# Patient Record
Sex: Male | Born: 1950 | Race: White | Hispanic: No | Marital: Single | Smoking: Current every day smoker
Health system: Southern US, Community
[De-identification: ages and names within clinical notes are randomized; demographics above are authoritative.]

## PROBLEM LIST (undated history)

## (undated) DIAGNOSIS — N400 Enlarged prostate without lower urinary tract symptoms: Secondary | ICD-10-CM

## (undated) DIAGNOSIS — I509 Heart failure, unspecified: Secondary | ICD-10-CM

## (undated) DIAGNOSIS — J449 Chronic obstructive pulmonary disease, unspecified: Secondary | ICD-10-CM

## (undated) DIAGNOSIS — H9190 Unspecified hearing loss, unspecified ear: Secondary | ICD-10-CM

## (undated) DIAGNOSIS — D649 Anemia, unspecified: Secondary | ICD-10-CM

## (undated) DIAGNOSIS — G43909 Migraine, unspecified, not intractable, without status migrainosus: Secondary | ICD-10-CM

## (undated) DIAGNOSIS — E785 Hyperlipidemia, unspecified: Secondary | ICD-10-CM

## (undated) DIAGNOSIS — K56609 Unspecified intestinal obstruction, unspecified as to partial versus complete obstruction: Secondary | ICD-10-CM

## (undated) DIAGNOSIS — D3502 Benign neoplasm of left adrenal gland: Secondary | ICD-10-CM

## (undated) DIAGNOSIS — D696 Thrombocytopenia, unspecified: Secondary | ICD-10-CM

## (undated) DIAGNOSIS — E43 Unspecified severe protein-calorie malnutrition: Secondary | ICD-10-CM

## (undated) HISTORY — PX: OTHER SURGICAL HISTORY: SHX169

## (undated) HISTORY — PX: APPENDECTOMY: SHX54

---

## 1999-02-22 ENCOUNTER — Emergency Department (HOSPITAL_COMMUNITY): Admission: EM | Admit: 1999-02-22 | Discharge: 1999-02-22 | Payer: Self-pay

## 2011-06-15 ENCOUNTER — Emergency Department (HOSPITAL_COMMUNITY): Payer: Self-pay

## 2011-06-15 ENCOUNTER — Emergency Department (HOSPITAL_COMMUNITY)
Admission: EM | Admit: 2011-06-15 | Discharge: 2011-06-15 | Disposition: A | Discharge status: Home / Self Care | Payer: Self-pay | Attending: Emergency Medicine | Admitting: Emergency Medicine

## 2011-06-15 DIAGNOSIS — S42009A Fracture of unspecified part of unspecified clavicle, initial encounter for closed fracture: Secondary | ICD-10-CM | POA: Insufficient documentation

## 2011-06-15 DIAGNOSIS — R079 Chest pain, unspecified: Secondary | ICD-10-CM | POA: Insufficient documentation

## 2012-06-15 ENCOUNTER — Other Ambulatory Visit (HOSPITAL_COMMUNITY): Payer: Self-pay | Admitting: Family Medicine

## 2012-06-15 DIAGNOSIS — J449 Chronic obstructive pulmonary disease, unspecified: Secondary | ICD-10-CM

## 2012-06-20 ENCOUNTER — Ambulatory Visit (HOSPITAL_COMMUNITY)
Admission: RE | Admit: 2012-06-20 | Discharge: 2012-06-20 | Disposition: A | Discharge status: Home / Self Care | Payer: Medicaid Other | Source: Ambulatory Visit | Attending: Family Medicine | Admitting: Family Medicine

## 2012-06-20 DIAGNOSIS — J4489 Other specified chronic obstructive pulmonary disease: Secondary | ICD-10-CM | POA: Insufficient documentation

## 2012-06-20 DIAGNOSIS — J449 Chronic obstructive pulmonary disease, unspecified: Secondary | ICD-10-CM | POA: Insufficient documentation

## 2012-06-20 MED ORDER — ALBUTEROL SULFATE (5 MG/ML) 0.5% IN NEBU: 2.5000 mg | INHALATION_SOLUTION | Freq: Once | RESPIRATORY_TRACT | Status: DC

## 2013-10-25 ENCOUNTER — Encounter (HOSPITAL_COMMUNITY): Payer: Self-pay | Admitting: Emergency Medicine

## 2013-10-25 ENCOUNTER — Emergency Department (HOSPITAL_COMMUNITY)
Admission: EM | Admit: 2013-10-25 | Discharge: 2013-10-25 | Disposition: A | Discharge status: Home / Self Care | Payer: Medicaid Other | Attending: Emergency Medicine | Admitting: Emergency Medicine

## 2013-10-25 ENCOUNTER — Emergency Department (HOSPITAL_COMMUNITY): Payer: Medicaid Other

## 2013-10-25 DIAGNOSIS — E871 Hypo-osmolality and hyponatremia: Secondary | ICD-10-CM | POA: Insufficient documentation

## 2013-10-25 DIAGNOSIS — F17200 Nicotine dependence, unspecified, uncomplicated: Secondary | ICD-10-CM | POA: Insufficient documentation

## 2013-10-25 DIAGNOSIS — J441 Chronic obstructive pulmonary disease with (acute) exacerbation: Secondary | ICD-10-CM | POA: Insufficient documentation

## 2013-10-25 HISTORY — DX: Chronic obstructive pulmonary disease, unspecified: J44.9

## 2013-10-25 LAB — CBC
HCT: 38.2 % — ABNORMAL LOW (ref 39.0–52.0)
HEMOGLOBIN: 13.5 g/dL (ref 13.0–17.0)
MCH: 29.5 pg (ref 26.0–34.0)
MCHC: 35.3 g/dL (ref 30.0–36.0)
MCV: 83.4 fL (ref 78.0–100.0)
Platelets: 287 10*3/uL (ref 150–400)
RBC: 4.58 MIL/uL (ref 4.22–5.81)
RDW: 14.5 % (ref 11.5–15.5)
WBC: 8.1 10*3/uL (ref 4.0–10.5)

## 2013-10-25 LAB — POCT I-STAT TROPONIN I: TROPONIN I, POC: 0.02 ng/mL (ref 0.00–0.08)

## 2013-10-25 LAB — BASIC METABOLIC PANEL
BUN: 18 mg/dL (ref 6–23)
CO2: 23 mEq/L (ref 19–32)
Calcium: 9.3 mg/dL (ref 8.4–10.5)
Chloride: 88 mEq/L — ABNORMAL LOW (ref 96–112)
Creatinine, Ser: 0.76 mg/dL (ref 0.50–1.35)
Glucose, Bld: 109 mg/dL — ABNORMAL HIGH (ref 70–99)
POTASSIUM: 4.7 meq/L (ref 3.7–5.3)
Sodium: 128 mEq/L — ABNORMAL LOW (ref 137–147)

## 2013-10-25 LAB — PRO B NATRIURETIC PEPTIDE: PRO B NATRI PEPTIDE: 193.6 pg/mL — AB (ref 0–125)

## 2013-10-25 MED ORDER — IPRATROPIUM-ALBUTEROL 20-100 MCG/ACT IN AERS: 1.0000 | INHALATION_SPRAY | Freq: Four times a day (QID) | RESPIRATORY_TRACT | Status: DC

## 2013-10-25 MED ORDER — PREDNISONE 50 MG PO TABS: 50.0000 mg | ORAL_TABLET | Freq: Every day | ORAL | Status: DC

## 2013-10-25 MED ORDER — ALBUTEROL SULFATE HFA 108 (90 BASE) MCG/ACT IN AERS: 1.0000 | INHALATION_SPRAY | RESPIRATORY_TRACT | Status: DC | PRN

## 2013-10-25 MED ORDER — METHYLPREDNISOLONE SODIUM SUCC 125 MG IJ SOLR
125.0000 mg | Freq: Once | INTRAMUSCULAR | Status: AC
Start: 2013-10-25 — End: 2013-10-25
  Administered 2013-10-25: 125 mg via INTRAVENOUS
  Filled 2013-10-25: qty 2

## 2013-10-25 MED ORDER — IPRATROPIUM-ALBUTEROL 0.5-2.5 (3) MG/3ML IN SOLN
3.0000 mL | Freq: Once | RESPIRATORY_TRACT | Status: AC
  Administered 2013-10-25: 3 mL via RESPIRATORY_TRACT
  Filled 2013-10-25: qty 3

## 2013-10-25 NOTE — ED Provider Notes (Signed)
CSN: [id number]     Arrival date & time 10/25/13  1231 History   First MD Initiated Contact with Patient 10/25/13 1247     Chief Complaint  Patient presents with  . Shortness of Breath  . Wheezing  . Cough    Patient is a 63 y.o. male presenting with shortness of breath, wheezing, cough, and fever. The history is provided by the patient.  Shortness of Breath Severity:  Moderate Onset quality:  Gradual Duration:  2 weeks Timing:  Constant Progression:  Worsening Relieved by:  Nothing Worsened by:  Exertion Associated symptoms: cough and wheezing   Associated symptoms: no chest pain and no fever   Wheezing Associated symptoms: cough and shortness of breath   Associated symptoms: no chest pain and no fever   Cough Associated symptoms: shortness of breath and wheezing   Associated symptoms: no chest pain and no fever   Fever Associated symptoms: cough   Associated symptoms: no chest pain    Pt has history of COPD.  He ran out of his medications 2 weeks ago.  He states he only can get one refill once a month and has run out.  Over the last two weeks after running out his medications his shortness of breath has increased.   Past Medical History  Diagnosis Date  . COPD (chronic obstructive pulmonary disease)    Past Surgical History  Procedure Laterality Date  . Appendectomy     No family history on file. History  Substance Use Topics  . Smoking status: Current Every Day Smoker  . Smokeless tobacco: Not on file  . Alcohol Use: Yes    Review of Systems  Constitutional: Negative for fever.  Respiratory: Positive for cough, shortness of breath and wheezing.   Cardiovascular: Negative for chest pain.  All other systems reviewed and are negative.    Allergies  Review of patient's allergies indicates no known allergies.  Home Medications   Current Outpatient Rx  Name  Route  Sig  Dispense  Refill  . Ipratropium-Albuterol (COMBIVENT RESPIMAT) 20-100 MCG/ACT AERS  respimat   Inhalation   Inhale 1 puff into the lungs every 6 (six) hours.         Marland Kitchen oxycodone (OXY-IR) 5 MG capsule   Oral   Take 5 mg by mouth every 4 (four) hours as needed.         . traZODone (DESYREL) 50 MG tablet   Oral   Take 50 mg by mouth at bedtime.         . predniSONE (DELTASONE) 50 MG tablet   Oral   Take 1 tablet (50 mg total) by mouth daily.   5 tablet   0     Dispense as written.    BP 121/83  Pulse 92  Temp(Src) 98.6 F (37 C) (Oral)  Resp 15  SpO2 95% Physical Exam  Nursing note and vitals reviewed. Constitutional: No distress.  Thin   HENT:  Head: Normocephalic and atraumatic.  Right Ear: External ear normal.  Left Ear: External ear normal.  Eyes: Conjunctivae are normal. Right eye exhibits no discharge. Left eye exhibits no discharge. No scleral icterus.  Neck: Neck supple. No tracheal deviation present.  Cardiovascular: Normal rate, regular rhythm and intact distal pulses.   Pulmonary/Chest: Effort normal and breath sounds normal. No stridor. No respiratory distress. He has no wheezes. He has no rales.  Abdominal: Soft. Bowel sounds are normal. He exhibits no distension. There is no tenderness. There is no  rebound and no guarding.  Musculoskeletal: He exhibits no edema and no tenderness.  Neurological: He is alert. He has normal strength. No cranial nerve deficit (no facial droop, extraocular movements intact, no slurred speech) or sensory deficit. He exhibits normal muscle tone. He displays no seizure activity. Coordination normal.  Skin: Skin is warm and dry. No rash noted. He is not diaphoretic.  Psychiatric: He has a normal mood and affect.    ED Course  Procedures (including critical care time) Labs Review Labs Reviewed  BASIC METABOLIC PANEL - Abnormal; Notable for the following:    Sodium 128 (*)    Chloride 88 (*)    Glucose, Bld 109 (*)    All other components within normal limits  CBC - Abnormal; Notable for the following:     HCT 38.2 (*)    All other components within normal limits  PRO B NATRIURETIC PEPTIDE - Abnormal; Notable for the following:    Pro B Natriuretic peptide (BNP) 193.6 (*)    All other components within normal limits  POCT I-STAT TROPONIN I   Imaging Review Dg Chest 2 View  10/25/2013   CLINICAL DATA:  Productive cough, shortness of breath, wheezing, history COPD, smoking  EXAM: CHEST  2 VIEW  COMPARISON:  06/15/2011  FINDINGS: Normal heart size, mediastinal contours, and pulmonary vascularity.  Emphysematous and minimal bronchitic changes consistent with COPD.  Probable bilateral nipple shadows, unchanged.  No acute infiltrate, pleural effusion or pneumothorax.  No acute osseous findings.  IMPRESSION: COPD changes.  No acute abnormalities.   Electronically Signed   By: Lavonia Dana M.D.   On: 10/25/2013 13:34    EKG Interpretation    Date/Time:  Wednesday October 25 2013 12:57:05 EST Ventricular Rate:  80 PR Interval:  153 QRS Duration: 90 QT Interval:  368 QTC Calculation: 424 R Axis:   73 Text Interpretation:  Sinus rhythm Baseline wander in lead(s) I III aVL poor r wave progression, no prior tracing for comparison Baseline wander in lead(s) I III aVL Confirmed by Pharell Rolfson  MD-J, Benard Minturn ([id number]) on 10/25/2013 1:41:51 PM           Medications  albuterol (PROVENTIL HFA;VENTOLIN HFA) 108 (90 BASE) MCG/ACT inhaler 1-2 puff (not administered)  ipratropium-albuterol (DUONEB) 0.5-2.5 (3) MG/3ML nebulizer solution 3 mL (3 mLs Nebulization Given 10/25/13 1413)  methylPREDNISolone sodium succinate (SOLU-MEDROL) 125 mg/2 mL injection 125 mg (125 mg Intravenous Given 10/25/13 1344)    MDM   1. COPD exacerbation     Pt improved with ed treatments.  I went to check on him in the room and he was dressed and ready to leave.  I will give him an albuterol inhaler in the Ed.  rx prednisone.  Hyponatremia noted on labs.  Recommend follow up with PCP.  He is not confused or lethargic, suspect this is  chronic but he will need to follow up with PCP.    At this time there does not appear to be any evidence of an acute emergency medical condition and the patient appears stable for discharge with appropriate outpatient follow up.     Kathalene Frames, MD 10/25/13 [phone]

## 2013-10-25 NOTE — ED Notes (Signed)
Patient transported to X-ray 

## 2013-10-25 NOTE — ED Notes (Signed)
Discharge instructions explained to pt; pt given copy of instructions and prescriptions for prednisone and combivent; pt spoke w/ case management about how to afford his medications; pt verbalized understanding instructions.

## 2013-10-25 NOTE — Progress Notes (Signed)
   CARE MANAGEMENT ED NOTE 10/25/2013  Patient:  Anthony Bolton, Anthony Bolton   Account Number:  [id number]  Date Initiated:  10/25/2013  Documentation initiated by:  Livia Snellen  Subjective/Objective Assessment:   Patient presents to ED with shortness nof breath wheezing and cough     Subjective/Objective Assessment Detail:     Action/Plan:   Action/Plan Detail:   Anticipated DC Date:  10/25/2013     Status Recommendation to Physician:   Result of Recommendation:      [place]  Other  Medication Assistance    Choice offered to / List presented to:            Status of service:  Completed, signed off  ED Comments:   ED Comments Detail:  [facility] consulted to speak to patient regarding medication assistance.  Patient currently with active Medicaid insurance.  EDCM spoke to patient at bedside.  EDCM explained to patient that he is active with Medicaid insurance and his medication copay is three dollars, therefore EDCM cannot assist with medication.  Patient verbalized understanding.  EDCM provided patient with a lsit of financial resources in the community sucha as local churches and [employer] to help with payment of medications if needed.  Patient reports that he is not homeless.  Patient thankful for resources.  No further EDCM needs at this time.

## 2013-10-25 NOTE — Discharge Instructions (Signed)
Chronic Obstructive Pulmonary Disease  Chronic obstructive pulmonary disease (COPD) is a common lung condition in which airflow from the lungs is limited. COPD is a general term that can be used to describe many different lung problems that limit airflow, including both chronic bronchitis and emphysema.  If you have COPD, your lung function will probably never return to normal, but there are measures you can take to improve lung function and make yourself feel better.   CAUSES   · Smoking (common).    · Exposure to secondhand smoke.    · Genetic problems.  · Chronic inflammatory lung diseases or recurrent infections.  SYMPTOMS   · Shortness of breath, especially with physical activity.    · Deep, persistent (chronic) cough with a large amount of thick mucus.    · Wheezing.    · Rapid breaths (tachypnea).    · Gray or bluish discoloration (cyanosis) of the skin, especially in fingers, toes, or lips.    · Fatigue.    · Weight loss.    · Frequent infections or episodes when breathing symptoms become much worse (exacerbations).    · Chest tightness.  DIAGNOSIS   Your healthcare provider will take a medical history and perform a physical examination to make the initial diagnosis.  Additional tests for COPD may include:   · Lung (pulmonary) function tests.  · Chest X-ray.  · CT scan.  · Blood tests.  TREATMENT   Treatment available to help you feel better when you have COPD include:   · Inhaler and nebulizer medicines. These help manage the symptoms of COPD and make your breathing more comfortable  · Supplemental oxygen. Supplemental oxygen is only helpful if you have a low oxygen level in your blood.    · Exercise and physical activity. These are beneficial for nearly all people with COPD. Some people may also benefit from a pulmonary rehabilitation program.  HOME CARE INSTRUCTIONS   · Take all medicines (inhaled or pills) as directed by your health care provider.  · Only take over-the-counter or prescription medicines  for pain, fever, or discomfort as directed by your health care provider.    · Avoid over-the-counter medicines or cough syrups that dry up your airway (such as antihistamines) and slow down the elimination of secretions unless instructed otherwise by your healthcare provider.    · If you are a smoker, the most important thing that you can do is stop smoking. Continuing to smoke will cause further lung damage and breathing trouble. Ask your health care provider for help with quitting smoking. He or she can direct you to community resources or hospitals that provide support.  · Avoid exposure to irritants such as smoke, chemicals, and fumes that aggravate your breathing.  · Use oxygen therapy and pulmonary rehabilitation if directed by your health care provider. If you require home oxygen therapy, ask your healthcare provider whether you should purchase a pulse oximeter to measure your oxygen level at home.    · Avoid contact with individuals who have a contagious illness.  · Avoid extreme temperature and humidity changes.  · Eat healthy foods. Eating smaller, more frequent meals and resting before meals may help you maintain your strength.  · Stay active, but balance activity with periods of rest. Exercise and physical activity will help you maintain your ability to do things you want to do.  · Preventing infection and hospitalization is very important when you have COPD. Make sure to receive all the vaccines your health care provider recommends, especially the pneumococcal and influenza vaccines. Ask your healthcare provider whether you   need a pneumonia vaccine.  · Learn and use relaxation techniques to manage stress.  · Learn and use controlled breathing techniques as directed by your health care provider. Controlled breathing techniques include:    · Pursed lip breathing. Start by breathing in (inhaling) through your nose for 1 second. Then, purse your lips as if you were going to whistle and breathe out (exhale)  through the pursed lips for 2 seconds.    · Diaphragmatic breathing. Start by putting one hand on your abdomen just above your waist. Inhale slowly through your nose. The hand on your abdomen should move out. Then purse your lips and exhale slowly. You should be able to feel the hand on your abdomen moving in as you exhale.    · Learn and use controlled coughing to clear mucus from your lungs. Controlled coughing is a series of short, progressive coughs. The steps of controlled coughing are:    1. Lean your head slightly forward.    2. Breathe in deeply using diaphragmatic breathing.    3. Try to hold your breath for 3 seconds.    4. Keep your mouth slightly open while coughing twice.    5. Spit any mucus out into a tissue.    6. Rest and repeat the steps once or twice as needed.  SEEK MEDICAL CARE IF:   · You are coughing up more mucus than usual.    · There is a change in the color or thickness of your mucus.    · Your breathing is more labored than usual.    · Your breathing is faster than usual.    SEEK IMMEDIATE MEDICAL CARE IF:   · You have shortness of breath while you are resting.    · You have shortness of breath that prevents you from:  · Being able to talk.    · Performing your usual physical activities.    · You have chest pain lasting longer than 5 minutes.    · Your skin color is more cyanotic than usual.  · You measure low oxygen saturations for longer than 5 minutes with a pulse oximeter.  MAKE SURE YOU:   · Understand these instructions.  · Will watch your condition.  · Will get help right away if you are not doing well or get worse.  Document Released: 06/24/2005 Document Revised: 07/05/2013 Document Reviewed: 05/11/2013  [employer]® Patient Information ©2014 [employer].

## 2013-10-25 NOTE — ED Notes (Signed)
Bed: WA07 Expected date:  Expected time:  Means of arrival:  Comments: SOB 

## 2013-10-25 NOTE — ED Notes (Signed)
Pt present with respiratory distress. - increased SOB x 2 weeks. Denies CP, N/V. 1.0 mg albuterol, 0.5 mg atrovent, 125 mg solumedrol. IVP. In route. Improvement with wheezing. And pt status.

## 2013-10-29 ENCOUNTER — Emergency Department (HOSPITAL_COMMUNITY)
Admission: EM | Admit: 2013-10-29 | Discharge: 2013-10-30 | Disposition: A | Discharge status: Home / Self Care | Payer: Medicaid Other | Attending: Emergency Medicine | Admitting: Emergency Medicine

## 2013-10-29 DIAGNOSIS — E871 Hypo-osmolality and hyponatremia: Secondary | ICD-10-CM | POA: Insufficient documentation

## 2013-10-29 DIAGNOSIS — Z79899 Other long term (current) drug therapy: Secondary | ICD-10-CM | POA: Insufficient documentation

## 2013-10-29 DIAGNOSIS — J449 Chronic obstructive pulmonary disease, unspecified: Secondary | ICD-10-CM

## 2013-10-29 DIAGNOSIS — IMO0002 Reserved for concepts with insufficient information to code with codable children: Secondary | ICD-10-CM | POA: Insufficient documentation

## 2013-10-29 DIAGNOSIS — J441 Chronic obstructive pulmonary disease with (acute) exacerbation: Secondary | ICD-10-CM | POA: Insufficient documentation

## 2013-10-29 DIAGNOSIS — F17200 Nicotine dependence, unspecified, uncomplicated: Secondary | ICD-10-CM | POA: Insufficient documentation

## 2013-10-29 DIAGNOSIS — I509 Heart failure, unspecified: Secondary | ICD-10-CM

## 2013-10-29 HISTORY — DX: Heart failure, unspecified: I50.9

## 2013-10-29 NOTE — ED Notes (Signed)
Per EMS pt is c/o shortness of breath  Was seen here a couple of days ago for same  Pt states he is out of his inhaler  Lungs clear bilaterally  Ambulatory on scene  No acute distress

## 2013-10-30 ENCOUNTER — Encounter (HOSPITAL_COMMUNITY): Payer: Self-pay | Admitting: Emergency Medicine

## 2013-10-30 ENCOUNTER — Emergency Department (HOSPITAL_COMMUNITY): Payer: Medicaid Other

## 2013-10-30 LAB — BASIC METABOLIC PANEL
BUN: 19 mg/dL (ref 6–23)
CO2: 25 mEq/L (ref 19–32)
Calcium: 9.2 mg/dL (ref 8.4–10.5)
Chloride: 85 mEq/L — ABNORMAL LOW (ref 96–112)
Creatinine, Ser: 0.65 mg/dL (ref 0.50–1.35)
GFR calc Af Amer: >90 mL/min (ref >90)
GLUCOSE: 101 mg/dL — AB (ref 70–99)
POTASSIUM: 4.9 meq/L (ref 3.7–5.3)
Sodium: 122 mEq/L — ABNORMAL LOW (ref 137–147)

## 2013-10-30 LAB — CBC WITH DIFFERENTIAL/PLATELET
BASOS ABS: 0 10*3/uL (ref 0.0–0.1)
Basophils Relative: 0 % (ref 0–1)
Eosinophils Absolute: 0.3 10*3/uL (ref 0.0–0.7)
Eosinophils Relative: 4 % (ref 0–5)
HCT: 33 % — ABNORMAL LOW (ref 39.0–52.0)
Hemoglobin: 11.8 g/dL — ABNORMAL LOW (ref 13.0–17.0)
LYMPHS ABS: 2.1 10*3/uL (ref 0.7–4.0)
LYMPHS PCT: 25 % (ref 12–46)
MCH: 29.5 pg (ref 26.0–34.0)
MCHC: 35.8 g/dL (ref 30.0–36.0)
MCV: 82.5 fL (ref 78.0–100.0)
Monocytes Absolute: 0.8 10*3/uL (ref 0.1–1.0)
Monocytes Relative: 10 % (ref 3–12)
Neutro Abs: 5.1 10*3/uL (ref 1.7–7.7)
Neutrophils Relative %: 61 % (ref 43–77)
PLATELETS: 305 10*3/uL (ref 150–400)
RBC: 4 MIL/uL — AB (ref 4.22–5.81)
RDW: 14.7 % (ref 11.5–15.5)
WBC: 8.3 10*3/uL (ref 4.0–10.5)

## 2013-10-30 LAB — TROPONIN I

## 2013-10-30 LAB — PRO B NATRIURETIC PEPTIDE: Pro B Natriuretic peptide (BNP): 147.2 pg/mL — ABNORMAL HIGH (ref 0–125)

## 2013-10-30 MED ORDER — ALBUTEROL SULFATE HFA 108 (90 BASE) MCG/ACT IN AERS
1.0000 | INHALATION_SPRAY | Freq: Once | RESPIRATORY_TRACT | Status: AC
  Administered 2013-10-30: 2 via RESPIRATORY_TRACT
  Filled 2013-10-30: qty 6.7

## 2013-10-30 MED ORDER — IPRATROPIUM BROMIDE 0.02 % IN SOLN
0.5000 mg | Freq: Once | RESPIRATORY_TRACT | Status: AC
  Administered 2013-10-30: 0.5 mg via RESPIRATORY_TRACT
  Filled 2013-10-30: qty 2.5

## 2013-10-30 MED ORDER — ALBUTEROL (5 MG/ML) CONTINUOUS INHALATION SOLN
10.0000 mg/h | INHALATION_SOLUTION | Freq: Once | RESPIRATORY_TRACT | Status: AC
  Administered 2013-10-30: 10 mg/h via RESPIRATORY_TRACT

## 2013-10-30 MED ORDER — ASPIRIN 81 MG PO CHEW
81.0000 mg | CHEWABLE_TABLET | Freq: Once | ORAL | Status: AC
  Administered 2013-10-30: 81 mg via ORAL
  Filled 2013-10-30: qty 1

## 2013-10-30 MED ORDER — METHYLPREDNISOLONE SODIUM SUCC 125 MG IJ SOLR
125.0000 mg | Freq: Once | INTRAMUSCULAR | Status: AC
  Administered 2013-10-30: 125 mg via INTRAVENOUS
  Filled 2013-10-30: qty 2

## 2013-10-30 MED ORDER — ALBUTEROL SULFATE HFA 108 (90 BASE) MCG/ACT IN AERS: 1.0000 | INHALATION_SPRAY | Freq: Four times a day (QID) | RESPIRATORY_TRACT | Status: DC | PRN

## 2013-10-30 NOTE — Discharge Instructions (Signed)
We saw you in the ER for the breathing problems. All the results in the ER are normal, labs and imaging. We do however see that your sodium is low -and ask that you see your doctor for further evaluation and repeat sodium level.  The workup in the ER is not complete, and is limited to screening for life threatening and emergent conditions only, so please see a primary care doctor for further evaluation.   Chronic Obstructive Pulmonary Disease Chronic obstructive pulmonary disease (COPD) is a common lung condition in which airflow from the lungs is limited. COPD is a general term that can be used to describe many different lung problems that limit airflow, including both chronic bronchitis and emphysema. If you have COPD, your lung function will probably never return to normal, but there are measures you can take to improve lung function and make yourself feel better.  CAUSES   Smoking (common).   Exposure to secondhand smoke.   Genetic problems.  Chronic inflammatory lung diseases or recurrent infections. SYMPTOMS   Shortness of breath, especially with physical activity.   Deep, persistent (chronic) cough with a large amount of thick mucus.   Wheezing.   Rapid breaths (tachypnea).   Gray or bluish discoloration (cyanosis) of the skin, especially in fingers, toes, or lips.   Fatigue.   Weight loss.   Frequent infections or episodes when breathing symptoms become much worse (exacerbations).   Chest tightness. DIAGNOSIS  Your healthcare provider will take a medical history and perform a physical examination to make the initial diagnosis. Additional tests for COPD may include:   Lung (pulmonary) function tests.  Chest X-ray.  CT scan.  Blood tests. TREATMENT  Treatment available to help you feel better when you have COPD include:   Inhaler and nebulizer medicines. These help manage the symptoms of COPD and make your breathing more comfortable  Supplemental  oxygen. Supplemental oxygen is only helpful if you have a low oxygen level in your blood.   Exercise and physical activity. These are beneficial for nearly all people with COPD. Some people may also benefit from a pulmonary rehabilitation program. HOME CARE INSTRUCTIONS   Take all medicines (inhaled or pills) as directed by your health care provider.  Only take over-the-counter or prescription medicines for pain, fever, or discomfort as directed by your health care provider.   Avoid over-the-counter medicines or cough syrups that dry up your airway (such as antihistamines) and slow down the elimination of secretions unless instructed otherwise by your healthcare provider.   If you are a smoker, the most important thing that you can do is stop smoking. Continuing to smoke will cause further lung damage and breathing trouble. Ask your health care provider for help with quitting smoking. He or she can direct you to community resources or hospitals that provide support.  Avoid exposure to irritants such as smoke, chemicals, and fumes that aggravate your breathing.  Use oxygen therapy and pulmonary rehabilitation if directed by your health care provider. If you require home oxygen therapy, ask your healthcare provider whether you should purchase a pulse oximeter to measure your oxygen level at home.   Avoid contact with individuals who have a contagious illness.  Avoid extreme temperature and humidity changes.  Eat healthy foods. Eating smaller, more frequent meals and resting before meals may help you maintain your strength.  Stay active, but balance activity with periods of rest. Exercise and physical activity will help you maintain your ability to do things you want  to do.  Preventing infection and hospitalization is very important when you have COPD. Make sure to receive all the vaccines your health care provider recommends, especially the pneumococcal and influenza vaccines. Ask your  healthcare provider whether you need a pneumonia vaccine.  Learn and use relaxation techniques to manage stress.  Learn and use controlled breathing techniques as directed by your health care provider. Controlled breathing techniques include:   Pursed lip breathing. Start by breathing in (inhaling) through your nose for 1 second. Then, purse your lips as if you were going to whistle and breathe out (exhale) through the pursed lips for 2 seconds.   Diaphragmatic breathing. Start by putting one hand on your abdomen just above your waist. Inhale slowly through your nose. The hand on your abdomen should move out. Then purse your lips and exhale slowly. You should be able to feel the hand on your abdomen moving in as you exhale.   Learn and use controlled coughing to clear mucus from your lungs. Controlled coughing is a series of short, progressive coughs. The steps of controlled coughing are:  1. Lean your head slightly forward.  2. Breathe in deeply using diaphragmatic breathing.  3. Try to hold your breath for 3 seconds.  4. Keep your mouth slightly open while coughing twice.  5. Spit any mucus out into a tissue.  6. Rest and repeat the steps once or twice as needed. SEEK MEDICAL CARE IF:   You are coughing up more mucus than usual.   There is a change in the color or thickness of your mucus.   Your breathing is more labored than usual.   Your breathing is faster than usual.  SEEK IMMEDIATE MEDICAL CARE IF:   You have shortness of breath while you are resting.   You have shortness of breath that prevents you from:  Being able to talk.   Performing your usual physical activities.   You have chest pain lasting longer than 5 minutes.   Your skin color is more cyanotic than usual.  You measure low oxygen saturations for longer than 5 minutes with a pulse oximeter. MAKE SURE YOU:   Understand these instructions.  Will watch your condition.  Will get help right  away if you are not doing well or get worse. Document Released: 06/24/2005 Document Revised: 07/05/2013 Document Reviewed: 05/11/2013 [facility] Patient Information 2014 [place], [place].  Hyponatremia  Hyponatremia is when the amount of salt (sodium) in your blood is too low. When sodium levels are low, your cells will absorb extra water and swell. The swelling happens throughout the body, but it mostly affects the brain. Severe brain swelling (cerebral edema), seizures, or coma can happen.  CAUSES   Heart, kidney, or liver problems.  Thyroid problems.  Adrenal gland problems.  Severe vomiting and diarrhea.  Certain medicines or illegal drugs.  Dehydration.  Drinking too much water.  Low-sodium diet. SYMPTOMS   Nausea and vomiting.  Confusion.  Lethargy.  Agitation.  Headache.  Twitching or shaking (seizures).  Unconsciousness.  Appetite loss.  Muscle weakness and cramping. DIAGNOSIS  Hyponatremia is identified by a simple blood test. Your caregiver will perform a history and physical exam to try to find the cause and type of hyponatremia. Other tests may be needed to measure the amount of sodium in your blood and urine. TREATMENT  Treatment will depend on the cause.   Fluids may be given through the vein (IV).  Medicines may be used to correct the sodium imbalance.  If medicines are causing the problem, they will need to be adjusted.  Water or fluid intake may be restricted to restore proper balance. The speed of correcting the sodium problem is very important. If the problem is corrected too fast, nerve damage (sometimes unchangeable) can happen. HOME CARE INSTRUCTIONS   Only take medicines as directed by your caregiver. Many medicines can make hyponatremia worse. Discuss all your medicines with your caregiver.  Carefully follow any recommended diet, including any fluid restrictions.  You may be asked to repeat lab tests. Follow these directions.  Avoid  alcohol and recreational drugs. SEEK MEDICAL CARE IF:   You develop worsening nausea, fatigue, headache, confusion, or weakness.  Your original hyponatremia symptoms return.  You have problems following the recommended diet. SEEK IMMEDIATE MEDICAL CARE IF:   You have a seizure.  You faint.  You have ongoing diarrhea or vomiting. MAKE SURE YOU:   Understand these instructions.  Will watch your condition.  Will get help right away if you are not doing well or get worse. Document Released: 09/04/2002 Document Revised: 12/07/2011 Document Reviewed: 03/01/2011 [facility] Patient Information 2014 [place], [place].

## 2013-10-30 NOTE — Progress Notes (Signed)
Peak flow completed at 160. Patient has good effort and follows directions fairly well. He has no teeth and there is a question of the tongue occluding flow.

## 2013-10-30 NOTE — ED Provider Notes (Signed)
CSN: [id number]     Arrival date & time 10/29/13  2352 History   First MD Initiated Contact with Patient 10/29/13 2354     Chief Complaint  Patient presents with  . Shortness of Breath   (Consider location/radiation/quality/duration/timing/severity/associated sxs/prior Treatment) HPI Comments: Pt with hx of COPD comes in with cc of dib. States that he just feels that it is tough to get air in. He has cough - non productive, no CAD hx and has no orthopnea, PND like sx. Pt is out of his inhaler. He occasionally smokes. No hx of PE, DVT, and no risk factors for the same. No chest pain, dib, dizziness.  Patient is a 63 y.o. male presenting with shortness of breath. The history is provided by the patient.  Shortness of Breath Associated symptoms: cough and wheezing   Associated symptoms: no abdominal pain and no chest pain     Past Medical History  Diagnosis Date  . COPD (chronic obstructive pulmonary disease)    Past Surgical History  Procedure Laterality Date  . Appendectomy     History reviewed. No pertinent family history. History  Substance Use Topics  . Smoking status: Current Every Day Smoker  . Smokeless tobacco: Not on file  . Alcohol Use: Yes    Review of Systems  Constitutional: Negative for activity change and appetite change.  Respiratory: Positive for cough, shortness of breath and wheezing.   Cardiovascular: Negative for chest pain and leg swelling.  Gastrointestinal: Negative for abdominal pain.  Genitourinary: Negative for dysuria.  Neurological: Negative for dizziness, syncope and light-headedness.    Allergies  Review of patient's allergies indicates no known allergies.  Home Medications   Current Outpatient Rx  Name  Route  Sig  Dispense  Refill  . Ipratropium-Albuterol (COMBIVENT RESPIMAT) 20-100 MCG/ACT AERS respimat   Inhalation   Inhale 1 puff into the lungs every 6 (six) hours.         . tadalafil (CIALIS) 5 MG tablet   Oral   Take 5 mg by  mouth daily as needed for erectile dysfunction (ED).         . traZODone (DESYREL) 50 MG tablet   Oral   Take 50 mg by mouth at bedtime.         . predniSONE (DELTASONE) 50 MG tablet   Oral   Take 1 tablet (50 mg total) by mouth daily.   5 tablet   0     Dispense as written.    BP 126/97  Pulse 86  Temp(Src) 98.3 F (36.8 C) (Oral)  Resp 14  Ht 5\' 9"  (1.753 m)  Wt 130 lb (58.968 kg)  BMI 19.19 kg/m2  SpO2 95% Physical Exam  Nursing note and vitals reviewed. Constitutional: He is oriented to person, place, and time. He appears well-developed.  HENT:  Head: Normocephalic and atraumatic.  Eyes: Conjunctivae and EOM are normal. Pupils are equal, round, and reactive to light.  Neck: Normal range of motion. Neck supple.  Cardiovascular: Normal rate and regular rhythm.   Pulmonary/Chest: Effort normal and breath sounds normal. He has no wheezes. He has no rales.  Abdominal: Soft. Bowel sounds are normal. He exhibits no distension. There is no tenderness. There is no rebound and no guarding.  Musculoskeletal: He exhibits no edema.  Neurological: He is alert and oriented to person, place, and time.  Skin: Skin is warm.    ED Course  Procedures (including critical care time) Labs Review Labs Reviewed  CBC WITH  DIFFERENTIAL - Abnormal; Notable for the following:    RBC 4.00 (*)    Hemoglobin 11.8 (*)    HCT 33.0 (*)    All other components within normal limits  BASIC METABOLIC PANEL  TROPONIN I  PRO B NATRIURETIC PEPTIDE   Imaging Review Dg Chest 2 View  10/30/2013   CLINICAL DATA:  Shortness of breath.  EXAM: CHEST  2 VIEW  COMPARISON:  PA and lateral chest 10/25/2013.  FINDINGS: The lungs are emphysematous but clear. Nipple shadows are again seen. No pneumothorax or pleural effusion. Heart size is normal.  IMPRESSION: Emphysema without acute disease.   Electronically Signed   By: Inge Rise M.D.   On: 10/30/2013 00:28    EKG Interpretation     Date/Time:  Monday October 30 2013 00:33:56 EST Ventricular Rate:  85 PR Interval:  157 QRS Duration: 79 QT Interval:  335 QTC Calculation: 398 R Axis:   76 Text Interpretation:  Sinus rhythm Anteroseptal infarct, age indeterminate No new changes Confirmed by Kathrynn Humble, MD, Ahmani Prehn ([id number]) on 10/30/2013 1:17:22 AM            MDM  No diagnosis found.  Pt comes in with cc of dib. Hx of COPD - recently seen in the ED for the same. States that he has no inhalers - and the sx started getting worse again. He has no new cough, not actively smoking, and has no chest pain. Pt ambulating in the ED without any trouble.  Pt is noted to have hyponatremia. He is alert, oriented. Informed of the findings, and asked to see pcp.  Pt has been given an inhaler in the ED. He is already on prednisone.  Varney Biles, MD 10/30/13 [phone]

## 2013-10-31 ENCOUNTER — Encounter (HOSPITAL_COMMUNITY): Payer: Self-pay | Admitting: Emergency Medicine

## 2013-10-31 ENCOUNTER — Inpatient Hospital Stay (HOSPITAL_COMMUNITY)
Admission: EM | Admit: 2013-10-31 | Discharge: 2013-11-01 | DRG: 191 | Disposition: A | Discharge status: Home / Self Care | Payer: Medicaid Other | Attending: Internal Medicine | Admitting: Internal Medicine

## 2013-10-31 DIAGNOSIS — H9190 Unspecified hearing loss, unspecified ear: Secondary | ICD-10-CM | POA: Diagnosis present

## 2013-10-31 DIAGNOSIS — J441 Chronic obstructive pulmonary disease with (acute) exacerbation: Principal | ICD-10-CM | POA: Diagnosis present

## 2013-10-31 DIAGNOSIS — IMO0002 Reserved for concepts with insufficient information to code with codable children: Secondary | ICD-10-CM

## 2013-10-31 DIAGNOSIS — R64 Cachexia: Secondary | ICD-10-CM | POA: Diagnosis present

## 2013-10-31 DIAGNOSIS — E871 Hypo-osmolality and hyponatremia: Secondary | ICD-10-CM | POA: Diagnosis present

## 2013-10-31 DIAGNOSIS — F17200 Nicotine dependence, unspecified, uncomplicated: Secondary | ICD-10-CM | POA: Diagnosis present

## 2013-10-31 DIAGNOSIS — J449 Chronic obstructive pulmonary disease, unspecified: Secondary | ICD-10-CM | POA: Diagnosis present

## 2013-10-31 LAB — CBC WITH DIFFERENTIAL/PLATELET
Basophils Absolute: 0 10*3/uL (ref 0.0–0.1)
Basophils Relative: 0 % (ref 0–1)
EOS ABS: 0.2 10*3/uL (ref 0.0–0.7)
Eosinophils Relative: 1 % (ref 0–5)
HCT: 30.9 % — ABNORMAL LOW (ref 39.0–52.0)
HEMOGLOBIN: 11.3 g/dL — AB (ref 13.0–17.0)
Lymphocytes Relative: 16 % (ref 12–46)
Lymphs Abs: 2 10*3/uL (ref 0.7–4.0)
MCH: 29.7 pg (ref 26.0–34.0)
MCHC: 36.6 g/dL — ABNORMAL HIGH (ref 30.0–36.0)
MCV: 81.1 fL (ref 78.0–100.0)
MONOS PCT: 13 % — AB (ref 3–12)
Monocytes Absolute: 1.5 10*3/uL — ABNORMAL HIGH (ref 0.1–1.0)
NEUTROS ABS: 8.5 10*3/uL — AB (ref 1.7–7.7)
NEUTROS PCT: 70 % (ref 43–77)
Platelets: 294 10*3/uL (ref 150–400)
RBC: 3.81 MIL/uL — AB (ref 4.22–5.81)
RDW: 14.5 % (ref 11.5–15.5)
WBC: 12.2 10*3/uL — ABNORMAL HIGH (ref 4.0–10.5)

## 2013-10-31 LAB — BASIC METABOLIC PANEL
BUN: 22 mg/dL (ref 6–23)
BUN: 20 mg/dL (ref 6–23)
BUN: 20 mg/dL (ref 6–23)
CHLORIDE: 81 meq/L — AB (ref 96–112)
CHLORIDE: 84 meq/L — AB (ref 96–112)
CHLORIDE: 87 meq/L — AB (ref 96–112)
CO2: 24 mEq/L (ref 19–32)
CO2: 22 mEq/L (ref 19–32)
CO2: 23 mEq/L (ref 19–32)
CREATININE: 0.6 mg/dL (ref 0.50–1.35)
Calcium: 8.9 mg/dL (ref 8.4–10.5)
Calcium: 8.9 mg/dL (ref 8.4–10.5)
Calcium: 8.7 mg/dL (ref 8.4–10.5)
Creatinine, Ser: 0.69 mg/dL (ref 0.50–1.35)
Creatinine, Ser: 0.65 mg/dL (ref 0.50–1.35)
GFR calc Af Amer: >90 mL/min (ref >90)
GFR calc non Af Amer: >90 mL/min (ref >90)
GLUCOSE: 103 mg/dL — AB (ref 70–99)
Glucose, Bld: 115 mg/dL — ABNORMAL HIGH (ref 70–99)
Glucose, Bld: 151 mg/dL — ABNORMAL HIGH (ref 70–99)
POTASSIUM: 4.1 meq/L (ref 3.7–5.3)
POTASSIUM: 4.1 meq/L (ref 3.7–5.3)
POTASSIUM: 4.6 meq/L (ref 3.7–5.3)
Sodium: 117 mEq/L — CL (ref 137–147)
Sodium: 120 mEq/L — CL (ref 137–147)
Sodium: 124 mEq/L — ABNORMAL LOW (ref 137–147)

## 2013-10-31 LAB — URINALYSIS, ROUTINE W REFLEX MICROSCOPIC
BILIRUBIN URINE: NEGATIVE
Glucose, UA: NEGATIVE mg/dL
HGB URINE DIPSTICK: NEGATIVE
Ketones, ur: NEGATIVE mg/dL
Leukocytes, UA: NEGATIVE
Nitrite: NEGATIVE
PH: 5.5 (ref 5.0–8.0)
Protein, ur: NEGATIVE mg/dL
SPECIFIC GRAVITY, URINE: 1.008 (ref 1.005–1.030)
Urobilinogen, UA: 0.2 mg/dL (ref 0.0–1.0)

## 2013-10-31 LAB — OSMOLALITY: Osmolality: 255 mOsm/kg — ABNORMAL LOW (ref 275–300)

## 2013-10-31 LAB — NA AND K (SODIUM & POTASSIUM), RAND UR
POTASSIUM UR: 11 meq/L
SODIUM UR: 26 meq/L

## 2013-10-31 LAB — OSMOLALITY, URINE: Osmolality, Ur: 203 mOsm/kg — ABNORMAL LOW (ref 390–1090)

## 2013-10-31 MED ORDER — HEPARIN SODIUM (PORCINE) 5000 UNIT/ML IJ SOLN
5000.0000 [IU] | Freq: Three times a day (TID) | INTRAMUSCULAR | Status: DC
  Administered 2013-10-31 – 2013-11-01 (×4): 5000 [IU] via SUBCUTANEOUS
  Filled 2013-10-31 (×7): qty 1

## 2013-10-31 MED ORDER — SODIUM CHLORIDE 0.9 % IV SOLN
INTRAVENOUS | Status: DC
  Administered 2013-10-31 – 2013-11-01 (×2): via INTRAVENOUS

## 2013-10-31 MED ORDER — ALBUTEROL SULFATE HFA 108 (90 BASE) MCG/ACT IN AERS: 1.0000 | INHALATION_SPRAY | Freq: Four times a day (QID) | RESPIRATORY_TRACT | Status: DC | PRN

## 2013-10-31 MED ORDER — IPRATROPIUM-ALBUTEROL 0.5-2.5 (3) MG/3ML IN SOLN
3.0000 mL | Freq: Once | RESPIRATORY_TRACT | Status: AC
  Administered 2013-10-31: 3 mL via RESPIRATORY_TRACT
  Filled 2013-10-31: qty 3

## 2013-10-31 MED ORDER — ALBUTEROL SULFATE (2.5 MG/3ML) 0.083% IN NEBU
5.0000 mg | INHALATION_SOLUTION | Freq: Once | RESPIRATORY_TRACT | Status: AC
  Administered 2013-10-31: 5 mg via RESPIRATORY_TRACT
  Filled 2013-10-31: qty 6

## 2013-10-31 MED ORDER — TRAZODONE HCL 50 MG PO TABS
50.0000 mg | ORAL_TABLET | Freq: Every day | ORAL | Status: DC
  Filled 2013-10-31: qty 1

## 2013-10-31 MED ORDER — ALBUTEROL SULFATE (2.5 MG/3ML) 0.083% IN NEBU: 2.5000 mg | INHALATION_SOLUTION | Freq: Four times a day (QID) | RESPIRATORY_TRACT | Status: DC | PRN

## 2013-10-31 MED ORDER — PREDNISONE 50 MG PO TABS
50.0000 mg | ORAL_TABLET | Freq: Every day | ORAL | Status: DC
  Administered 2013-10-31 – 2013-11-01 (×2): 50 mg via ORAL
  Filled 2013-10-31 (×2): qty 1

## 2013-10-31 MED ORDER — IPRATROPIUM BROMIDE 0.02 % IN SOLN
0.5000 mg | Freq: Once | RESPIRATORY_TRACT | Status: AC
  Administered 2013-10-31: 0.5 mg via RESPIRATORY_TRACT
  Filled 2013-10-31: qty 2.5

## 2013-10-31 MED ORDER — METHYLPREDNISOLONE SODIUM SUCC 125 MG IJ SOLR
125.0000 mg | Freq: Once | INTRAMUSCULAR | Status: AC
  Administered 2013-10-31: 125 mg via INTRAVENOUS
  Filled 2013-10-31: qty 2

## 2013-10-31 MED ORDER — IPRATROPIUM-ALBUTEROL 0.5-2.5 (3) MG/3ML IN SOLN
3.0000 mL | Freq: Four times a day (QID) | RESPIRATORY_TRACT | Status: DC | PRN
  Administered 2013-10-31: 3 mL via RESPIRATORY_TRACT
  Filled 2013-10-31: qty 3

## 2013-10-31 NOTE — H&P (Signed)
[facility] History and Physical  Anthony Bolton UKG:[id number] DOB: 1951/06/01 DOA: 10/31/2013  Referring physician: EDP PCP: [name], MD   Chief Complaint: SOB   HPI: Anthony Bolton is a 63 y.o. male h/o COPD presents to ED with SOB.  He has been out of combivent inhalor for past 2 weeks.  Patient was evaluated in ED and not found to have overly severe COPD exacerbation (no wheezing, satting 97%); however, he was found to have worsening hyponatremia and his sodium is now down to 117 (was 122 yesterday and 128 on Anthony 28).  Due to the worsening hyponatremia over the past couple of days, he is being admitted for treatment and work up as to the cause.  Review of Systems: Systems reviewed.  As above, otherwise negative  Past Medical History  Diagnosis Date  . COPD (chronic obstructive pulmonary disease)    Past Surgical History  Procedure Laterality Date  . Appendectomy     Social History:  reports that he has been smoking Cigarettes.  He has been smoking about 0.00 packs per day. He does not have any smokeless tobacco history on file. He reports that he does not drink alcohol or use illicit drugs.  No Known Allergies  No family history on file.   Prior to Admission medications   Medication Sig Start Date End Date Taking? Authorizing Provider  albuterol (PROVENTIL HFA;VENTOLIN HFA) 108 (90 BASE) MCG/ACT inhaler Inhale 1-2 puffs into the lungs every 6 (six) hours as needed for wheezing or shortness of breath. 10/30/13  Yes Varney Biles, MD  tadalafil (CIALIS) 5 MG tablet Take 5 mg by mouth daily as needed for erectile dysfunction (ED).   Yes Historical Provider, MD  traZODone (DESYREL) 50 MG tablet Take 50 mg by mouth at bedtime.   Yes Historical Provider, MD  predniSONE (DELTASONE) 50 MG tablet Take 1 tablet (50 mg total) by mouth daily. 10/25/13   Kathalene Frames, MD   Physical Exam: Filed Vitals:   10/31/13 0544  BP:   Pulse: 76  Temp:   Resp: 15    BP 127/91   Pulse 76  Temp(Src) 97.5 F (36.4 C) (Oral)  Resp 15  SpO2 97%  General Appearance:    Alert, oriented, no distress, appears stated age, cachectic, very hard of hearing  Head:    Normocephalic, atraumatic  Eyes:    PERRL, EOMI, sclera non-icteric        Nose:   Nares without drainage or epistaxis. Mucosa, turbinates normal  Throat:   Moist mucous membranes. Oropharynx without erythema or exudate.  Neck:   Supple. No carotid bruits.  No thyromegaly.  No lymphadenopathy.   Back:     No CVA tenderness, no spinal tenderness  Lungs:     Clear to auscultation bilaterally, without wheezes, rhonchi or rales  Chest wall:    No tenderness to palpitation  Heart:    Regular rate and rhythm without murmurs, gallops, rubs  Abdomen:     Soft, non-tender, nondistended, normal bowel sounds, no organomegaly  Genitalia:    deferred  Rectal:    deferred  Extremities:   No clubbing, cyanosis or edema.  Pulses:   2+ and symmetric all extremities  Skin:   Skin color, texture, turgor normal, no rashes or lesions  Lymph nodes:   Cervical, supraclavicular, and axillary nodes normal  Neurologic:   CNII-XII intact. Normal strength, sensation and reflexes      throughout    Labs on Admission:  Basic Metabolic Panel:  Recent Labs Lab 10/25/13 1320 10/30/13 0040 10/31/13 0450  NA 128* 122* 117*  K 4.7 4.9 4.1  CL 88* 85* 81*  CO2 23 25 24   GLUCOSE 109* 101* 103*  BUN 18 19 22   CREATININE 0.76 0.65 0.69  CALCIUM 9.3 9.2 8.9   Liver Function Tests: No results found for this basename: AST, ALT, ALKPHOS, BILITOT, PROT, ALBUMIN,  in the last 168 hours No results found for this basename: LIPASE, AMYLASE,  in the last 168 hours No results found for this basename: AMMONIA,  in the last 168 hours CBC:  Recent Labs Lab 10/25/13 1320 10/30/13 0040 10/31/13 0450  WBC 8.1 8.3 12.2*  NEUTROABS  --  5.1 8.5*  HGB 13.5 11.8* 11.3*  HCT 38.2* 33.0* 30.9*  MCV 83.4 82.5 81.1  PLT 287 305 294   Cardiac  Enzymes:  Recent Labs Lab 10/30/13 0040  TROPONINI <0.30    BNP (last 3 results)  Recent Labs  10/25/13 1320 10/30/13 0040  PROBNP 193.6* 147.2*   CBG: No results found for this basename: GLUCAP,  in the last 168 hours  Radiological Exams on Admission: Dg Chest 2 View  10/30/2013   CLINICAL DATA:  Shortness of breath.  EXAM: CHEST  2 VIEW  COMPARISON:  PA and lateral chest 10/25/2013.  FINDINGS: The lungs are emphysematous but clear. Nipple shadows are again seen. No pneumothorax or pleural effusion. Heart size is normal.  IMPRESSION: Emphysema without acute disease.   Electronically Signed   By: Inge Rise M.D.   On: 10/30/2013 00:28    EKG: Independently reviewed.  Assessment/Plan Principal Problem:   Hyponatremia Active Problems:   COPD (chronic obstructive pulmonary disease)   1. Hyponatremia - DDX is broad at this point, to narrow this down have ordered serum osmolality, urine osm, urine sodium.  Patient put on normal saline by EDP, but will need to discontinue this if he is found to be in SIADH.  Q6H BMPs ordered.  He denies EtOH intake, and states he drinks "some" water at home (but does not give a history suggestive of primary polydipsia).  Given his cachectic appearance and difficulty with breathing despite clear sounding lungs, if he does end up having SIADH by evaluation of his urine then next step would probably be CT chest to evaluate for neoplasm.  Urine appears to be very dilute for SIADH however with SG of 1.008 making this less likely; however, will need to wait for urine sodium to come back to be certain. 2. COPD - will continue prednisone, adult wheeze protocol for breathing treatments.    Code Status: Full  Family Communication: No family in room Disposition Plan: Admit to inpatient   Time spent: 50 min  GARDNER, JARED M. [facility] Pager [phone]  If 7AM-7PM, please contact the day team taking care of the patient [email or web address] Password  [email or web address] 10/31/2013, 6:06 AM

## 2013-10-31 NOTE — Progress Notes (Signed)
Patient seen and examined this morning. Agree with H&P. Awaiting osmolality, monitor sodium levels today. Like in the setting of dehydration, trazodone probably contributing so will discontinue that.  Washington Whedbee M. Cruzita Lederer, MD [facility] [phone]

## 2013-10-31 NOTE — Care Management Note (Signed)
   CARE MANAGEMENT NOTE 10/31/2013  Patient:  Anthony Bolton, Anthony Bolton   Account Number:  [id number]  Date Initiated:  10/31/2013  Documentation initiated by:  Arria Naim  Subjective/Objective Assessment:   63 yo male admitted with hyponatremia.     Action/Plan:   Home when stable   Anticipated DC Date:     Anticipated DC Plan:  [place]  CM consult      Choice offered to / List presented to:  NA   DME arranged  NA      DME agency  NA     [place] arranged  NA      [place] agency  NA   Status of service:  In process, will continue to follow Medicare Important Message given?   (If response is "NO", the following Medicare IM given date fields will be blank) Date Medicare IM given:   Date Additional Medicare IM given:    Discharge Disposition:    Per UR Regulation:  Reviewed for med. necessity/level of care/duration of stay  If discussed at [place] of Stay Meetings, dates discussed:    Comments:  10/31/13 [place] [phone] Chart reviewed for utilization of services. RN reports pt confused. Will continue to follow for possible Home Health needs.

## 2013-10-31 NOTE — ED Provider Notes (Signed)
CSN: [id number]     Arrival date & time 10/31/13  0408 History   First MD Initiated Contact with Patient 10/31/13 0425     Chief Complaint  Patient presents with  . COPD   (Consider location/radiation/quality/duration/timing/severity/associated sxs/prior Treatment) HPI Comments: Patient returns to the emergency room stating, that he can't breathe he is speaking in full sentences.  At this time.  He reports, that he is out of his Combivent inhaler for the past 2, weeks. He is not scheduled to see his primary care physician until later.  This month, when he was seen on February 2.  His sodium was 122.  He, states he is not been able to schedule an earlier appointment to have this rechecked. He has no other complaints.  No nausea, vomiting, diarrhea, chest pain.  The history is provided by the patient.    Past Medical History  Diagnosis Date  . COPD (chronic obstructive pulmonary disease)    Past Surgical History  Procedure Laterality Date  . Appendectomy     No family history on file. History  Substance Use Topics  . Smoking status: Current Every Day Smoker    Types: Cigarettes  . Smokeless tobacco: Not on file  . Alcohol Use: No    Review of Systems  Constitutional: Negative for fever and chills.  Respiratory: Positive for shortness of breath. Negative for cough and wheezing.   Cardiovascular: Negative for chest pain.  Gastrointestinal: Negative for nausea and vomiting.  Skin: Negative for pallor, rash and wound.  Neurological: Negative for weakness and headaches.  All other systems reviewed and are negative.    Allergies  Review of patient's allergies indicates no known allergies.  Home Medications   Current Outpatient Rx  Name  Route  Sig  Dispense  Refill  . albuterol (PROVENTIL HFA;VENTOLIN HFA) 108 (90 BASE) MCG/ACT inhaler   Inhalation   Inhale 1-2 puffs into the lungs every 6 (six) hours as needed for wheezing or shortness of breath.   1 Inhaler   0   .  tadalafil (CIALIS) 5 MG tablet   Oral   Take 5 mg by mouth daily as needed for erectile dysfunction (ED).         . traZODone (DESYREL) 50 MG tablet   Oral   Take 50 mg by mouth at bedtime.         . predniSONE (DELTASONE) 50 MG tablet   Oral   Take 1 tablet (50 mg total) by mouth daily.   5 tablet   0     Dispense as written.    BP 127/91  Pulse 76  Temp(Src) 97.5 F (36.4 C) (Oral)  Resp 15  SpO2 97% Physical Exam  Constitutional: He appears well-developed and well-nourished.  HENT:  Head: Normocephalic.  Eyes: Pupils are equal, round, and reactive to light.  Neck: Normal range of motion.  Cardiovascular: Normal rate and regular rhythm.   Pulmonary/Chest: Effort normal and breath sounds normal. No respiratory distress. He has no wheezes.  Patient is using pursed lip breathing, but is able to speak in full sentences  Abdominal: Soft.  Musculoskeletal: Normal range of motion.  Neurological: He is alert.  Skin: Skin is warm. No rash noted.    ED Course  Procedures (including critical care time) Labs Review Labs Reviewed  CBC WITH DIFFERENTIAL - Abnormal; Notable for the following:    WBC 12.2 (*)    RBC 3.81 (*)    Hemoglobin 11.3 (*)    HCT  30.9 (*)    MCHC 36.6 (*)    Neutro Abs 8.5 (*)    Monocytes Relative 13 (*)    Monocytes Absolute 1.5 (*)    All other components within normal limits  BASIC METABOLIC PANEL - Abnormal; Notable for the following:    Sodium 117 (*)    Chloride 81 (*)    Glucose, Bld 103 (*)    All other components within normal limits  URINALYSIS, ROUTINE W REFLEX MICROSCOPIC   Imaging Review Dg Chest 2 View  10/30/2013   CLINICAL DATA:  Shortness of breath.  EXAM: CHEST  2 VIEW  COMPARISON:  PA and lateral chest 10/25/2013.  FINDINGS: The lungs are emphysematous but clear. Nipple shadows are again seen. No pneumothorax or pleural effusion. Heart size is normal.  IMPRESSION: Emphysema without acute disease.   Electronically Signed    By: Inge Rise M.D.   On: 10/30/2013 00:28    EKG Interpretation   None       MDM   1. Hyponatremia   2. COPD exacerbation         Garald Balding, NP 10/31/13 [phone]

## 2013-10-31 NOTE — ED Provider Notes (Signed)
Medical screening examination/treatment/procedure(s) were conducted as a shared visit with non-physician practitioner(s) and myself.  I personally evaluated the patient during the encounter.  5:39 AM Patient resting comfortably, O2 saturation 97% on room air after albuterol neb treatment; no wheezing heard. Patient was advised of steadily falling sodium levels and need to have him admitted for further evaluation and treatment of this.    Wynetta Fines, MD 10/31/13 [phone]

## 2013-10-31 NOTE — ED Notes (Signed)
Daneil Dan called to update pt refused half of albuterol/atrovent treatment with respiratory at bedside. Pt transport to floor at present time.

## 2013-10-31 NOTE — ED Notes (Signed)
Per EMS report: pt from home: pt seen three days a row for the same complaints.  Pt hx of COPD and reports he has "force his air in."  Pt reports lungs hurt.  EMS notes that his lungs sounds were clear and pt was 99% RA and HR: 86. Pt hard of hearing.  Pt a/o x 4. Skin warm and dry.

## 2013-10-31 NOTE — ED Notes (Signed)
Bed: AS50 Expected date:  Expected time:  Means of arrival:  Comments: EMS COPD exac

## 2013-11-01 LAB — BASIC METABOLIC PANEL
BUN: 15 mg/dL (ref 6–23)
BUN: 18 mg/dL (ref 6–23)
CALCIUM: 8.7 mg/dL (ref 8.4–10.5)
CO2: 22 mEq/L (ref 19–32)
CO2: 24 meq/L (ref 19–32)
Calcium: 8.6 mg/dL (ref 8.4–10.5)
Chloride: 89 mEq/L — ABNORMAL LOW (ref 96–112)
Chloride: 94 mEq/L — ABNORMAL LOW (ref 96–112)
Creatinine, Ser: 0.59 mg/dL (ref 0.50–1.35)
Creatinine, Ser: 0.6 mg/dL (ref 0.50–1.35)
GFR calc non Af Amer: >90 mL/min (ref >90)
GLUCOSE: 105 mg/dL — AB (ref 70–99)
Glucose, Bld: 133 mg/dL — ABNORMAL HIGH (ref 70–99)
POTASSIUM: 4.3 meq/L (ref 3.7–5.3)
Potassium: 4.5 mEq/L (ref 3.7–5.3)
SODIUM: 128 meq/L — AB (ref 137–147)
Sodium: 125 mEq/L — ABNORMAL LOW (ref 137–147)

## 2013-11-01 MED ORDER — IPRATROPIUM-ALBUTEROL 0.5-2.5 (3) MG/3ML IN SOLN: 3.0000 mL | Freq: Four times a day (QID) | RESPIRATORY_TRACT | Status: DC | PRN

## 2013-11-01 MED ORDER — PREDNISONE 50 MG PO TABS: 50.0000 mg | ORAL_TABLET | Freq: Every day | ORAL | Status: DC

## 2013-11-01 MED ORDER — IPRATROPIUM-ALBUTEROL 20-100 MCG/ACT IN AERS: 1.0000 | INHALATION_SPRAY | Freq: Four times a day (QID) | RESPIRATORY_TRACT | Status: DC | PRN

## 2013-11-01 MED ORDER — ALBUTEROL SULFATE HFA 108 (90 BASE) MCG/ACT IN AERS: 1.0000 | INHALATION_SPRAY | Freq: Four times a day (QID) | RESPIRATORY_TRACT | Status: DC | PRN

## 2013-11-01 NOTE — Discharge Summary (Signed)
Physician Discharge Summary  LILBURN STRAW VQX:[id number] DOB: September 12, 1951 DOA: 10/31/2013  PCP: [name], MD  Admit date: 10/31/2013 Discharge date: 11/01/2013  Recommendations for Outpatient Follow-up:  1. Please continue Combivent and duo nebs as prescribed as needed for shortness of breath or wheezing 2. Please stop smoking as this will continue to worsen your respiratory status and cause you to be more short of breath 3. Continue prednisone for 5 days on discharge. 4. Please followup with your primary care physician in about one week after discharge. Her sodium level is 128 at the time of discharge which is up since the admission. Please have your PCP recheck her sodium level.  Discharge Diagnoses:  Principal Problem:   Hyponatremia Active Problems:   COPD (chronic obstructive pulmonary disease)    Discharge Condition: Patient insists on going home today  Diet recommendation: As tolerated  History of present illness:  Patient is 63 year old male with past medical history of COPD, active smoking who presented to [facility] ED 10/31/13 with worsening shortness of breath for past one to 2 days prior to this admission. Patient was admitted for COPD exacerbation. In addition, he was found to have a sodium level of 117 on admission.  Hospital Course:  Principal Problem:   Hyponatremia - Likely due to dehydration. Has received IV fluids. Sodium is 128 at the time of discharge. Patient instructed to followup with PCP and have his PCP recheck sodium level to make sure it is trending up Active Problems:   COPD (chronic obstructive pulmonary disease) - Patient will continue taking albuterol inhaler and duo neb nebulizer as needed for shortness of breath or wheezing. He was instructed to refrain from smoking. He insists on going home today. Patient is alert and oriented to time, place and person   Signed:  Leisa Lenz, MD  [facility] 11/01/2013, 10:39 AM  Pager #:  [phone]  Procedures:  None  Consultations:  None  Discharge Exam: Filed Vitals:   11/01/13 0710  BP: 142/94  Pulse: 84  Temp: 98.5 F (36.9 C)  Resp: 20   Filed Vitals:   10/31/13 2215 10/31/13 2321 11/01/13 0500 11/01/13 0710  BP: 142/98   142/94  Pulse: 91   84  Temp: 98.5 F (36.9 C)   98.5 F (36.9 C)  TempSrc: Oral   Oral  Resp: 20   20  Height:   5\' 8"  (1.727 m)   Weight:   58.968 kg (130 lb)   SpO2: 96% 100%  98%    General: Pt is alert, follows commands appropriately, not in acute distress Cardiovascular: Regular rate and rhythm, S1/S2 +, no murmurs, no rubs, no gallops Respiratory: Mild wheezing in upper lung lobes, no crackles. Abdominal: Soft, non tender, non distended, bowel sounds +, no guarding Extremities: no edema, no cyanosis, pulses palpable bilaterally DP and PT Neuro: Grossly nonfocal  Discharge Instructions  Discharge Orders   Future Orders Complete By Expires   Call MD for:  difficulty breathing, headache or visual disturbances  As directed    Call MD for:  persistant dizziness or light-headedness  As directed    Call MD for:  persistant nausea and vomiting  As directed    Call MD for:  severe uncontrolled pain  As directed    Diet - low sodium heart healthy  As directed    Discharge instructions  As directed    Comments:     1. Please continue Combivent and duo nebs as prescribed as needed for shortness  of breath or wheezing 2. Please stop smoking as this will continue to worsen your respiratory status and cause you to be more short of breath 3. Continue prednisone for 5 days on discharge. 4. Please followup with your primary care physician in about one week after discharge. Her sodium level is 128 at the time of discharge which is up since the admission. Please have your PCP recheck her sodium level.   Increase activity slowly  As directed        Medication List         albuterol 108 (90 BASE) MCG/ACT inhaler  Commonly known  as:  PROVENTIL HFA;VENTOLIN HFA  Inhale 1-2 puffs into the lungs every 6 (six) hours as needed for wheezing or shortness of breath.     Ipratropium-Albuterol 20-100 MCG/ACT Aers respimat  Commonly known as:  COMBIVENT  Inhale 1 puff into the lungs every 6 (six) hours as needed for wheezing.     ipratropium-albuterol 0.5-2.5 (3) MG/3ML Soln  Commonly known as:  DUONEB  Inhale 3 mLs into the lungs every 6 (six) hours as needed.     predniSONE 50 MG tablet  Commonly known as:  DELTASONE  Take 1 tablet (50 mg total) by mouth daily.     tadalafil 5 MG tablet  Commonly known as:  CIALIS  Take 5 mg by mouth daily as needed for erectile dysfunction (ED).     traZODone 50 MG tablet  Commonly known as:  DESYREL  Take 50 mg by mouth at bedtime.           Follow-up Information   Follow up with [name], MD On 11/06/2013. (10:15 am )    Specialty:  Family Medicine   Contact information:   [address] [place] [id number] [phone]        The results of significant diagnostics from this hospitalization (including imaging, microbiology, ancillary and laboratory) are listed below for reference.    Significant Diagnostic Studies: Dg Chest 2 View  10/30/2013   CLINICAL DATA:  Shortness of breath.  EXAM: CHEST  2 VIEW  COMPARISON:  PA and lateral chest 10/25/2013.  FINDINGS: The lungs are emphysematous but clear. Nipple shadows are again seen. No pneumothorax or pleural effusion. Heart size is normal.  IMPRESSION: Emphysema without acute disease.   Electronically Signed   By: Inge Rise M.D.   On: 10/30/2013 00:28   Dg Chest 2 View  10/25/2013   CLINICAL DATA:  Productive cough, shortness of breath, wheezing, history COPD, smoking  EXAM: CHEST  2 VIEW  COMPARISON:  06/15/2011  FINDINGS: Normal heart size, mediastinal contours, and pulmonary vascularity.  Emphysematous and minimal bronchitic changes consistent with COPD.  Probable bilateral nipple shadows, unchanged.   No acute infiltrate, pleural effusion or pneumothorax.  No acute osseous findings.  IMPRESSION: COPD changes.  No acute abnormalities.   Electronically Signed   By: Lavonia Dana M.D.   On: 10/25/2013 13:34    Microbiology: No results found for this or any previous visit (from the past 240 hour(s)).   Labs: Basic Metabolic Panel:  Recent Labs Lab 10/31/13 0450 10/31/13 0810 10/31/13 1813 10/31/13 2351 11/01/13 0608  NA 117* 120* 124* 125* 128*  K 4.1 4.1 4.6 4.5 4.3  CL 81* 84* 87* 89* 94*  CO2 24 22 23 24 22   GLUCOSE 103* 115* 151* 133* 105*  BUN 22 20 20 18 15   CREATININE 0.69 0.65 0.60 0.60 0.59  CALCIUM 8.9 8.9  8.7 8.7 8.6   Liver Function Tests: No results found for this basename: AST, ALT, ALKPHOS, BILITOT, PROT, ALBUMIN,  in the last 168 hours No results found for this basename: LIPASE, AMYLASE,  in the last 168 hours No results found for this basename: AMMONIA,  in the last 168 hours CBC:  Recent Labs Lab 10/25/13 1320 10/30/13 0040 10/31/13 0450  WBC 8.1 8.3 12.2*  NEUTROABS  --  5.1 8.5*  HGB 13.5 11.8* 11.3*  HCT 38.2* 33.0* 30.9*  MCV 83.4 82.5 81.1  PLT 287 305 294   Cardiac Enzymes:  Recent Labs Lab 10/30/13 0040  TROPONINI <0.30   BNP: BNP (last 3 results)  Recent Labs  10/25/13 1320 10/30/13 0040  PROBNP 193.6* 147.2*   CBG: No results found for this basename: GLUCAP,  in the last 168 hours  Time coordinating discharge: Over 30 minutes

## 2013-11-01 NOTE — Care Management Note (Signed)
Cm spoke with patient at the bedside concerning discharge planning. Per Anthony Bolton lives alone. Independently ambulating in room. Cm spoke with Anthony Bolton's adult son Camillia Herter at [phone] whom will provide Anthony Bolton transportation home and assist in home care. Anthony Bolton provided CM permission to schedule hospital discharge follow up appointment on 11/06/13 @ 10:15 am with Dr.Bland.   Venita Lick Geovanie Winnett,MSN,RN [phone]

## 2013-11-01 NOTE — Progress Notes (Signed)
Patient received discharge instructions and verbalized understanding. Follow up appointments, prescriptions, and medications discussed. Patient belongings packed. Patient to ambulate downstairs to be discharged home. Son at bedside.

## 2013-11-01 NOTE — Discharge Instructions (Signed)
Chronic Obstructive Pulmonary Disease  Chronic obstructive pulmonary disease (COPD) is a common lung condition in which airflow from the lungs is limited. COPD is a general term that can be used to describe many different lung problems that limit airflow, including both chronic bronchitis and emphysema.  If you have COPD, your lung function will probably never return to normal, but there are measures you can take to improve lung function and make yourself feel better.   CAUSES   · Smoking (common).    · Exposure to secondhand smoke.    · Genetic problems.  · Chronic inflammatory lung diseases or recurrent infections.  SYMPTOMS   · Shortness of breath, especially with physical activity.    · Deep, persistent (chronic) cough with a large amount of thick mucus.    · Wheezing.    · Rapid breaths (tachypnea).    · Gray or bluish discoloration (cyanosis) of the skin, especially in fingers, toes, or lips.    · Fatigue.    · Weight loss.    · Frequent infections or episodes when breathing symptoms become much worse (exacerbations).    · Chest tightness.  DIAGNOSIS   Your healthcare provider will take a medical history and perform a physical examination to make the initial diagnosis.  Additional tests for COPD may include:   · Lung (pulmonary) function tests.  · Chest X-ray.  · CT scan.  · Blood tests.  TREATMENT   Treatment available to help you feel better when you have COPD include:   · Inhaler and nebulizer medicines. These help manage the symptoms of COPD and make your breathing more comfortable  · Supplemental oxygen. Supplemental oxygen is only helpful if you have a low oxygen level in your blood.    · Exercise and physical activity. These are beneficial for nearly all people with COPD. Some people may also benefit from a pulmonary rehabilitation program.  HOME CARE INSTRUCTIONS   · Take all medicines (inhaled or pills) as directed by your health care provider.  · Only take over-the-counter or prescription medicines  for pain, fever, or discomfort as directed by your health care provider.    · Avoid over-the-counter medicines or cough syrups that dry up your airway (such as antihistamines) and slow down the elimination of secretions unless instructed otherwise by your healthcare provider.    · If you are a smoker, the most important thing that you can do is stop smoking. Continuing to smoke will cause further lung damage and breathing trouble. Ask your health care provider for help with quitting smoking. He or she can direct you to community resources or hospitals that provide support.  · Avoid exposure to irritants such as smoke, chemicals, and fumes that aggravate your breathing.  · Use oxygen therapy and pulmonary rehabilitation if directed by your health care provider. If you require home oxygen therapy, ask your healthcare provider whether you should purchase a pulse oximeter to measure your oxygen level at home.    · Avoid contact with individuals who have a contagious illness.  · Avoid extreme temperature and humidity changes.  · Eat healthy foods. Eating smaller, more frequent meals and resting before meals may help you maintain your strength.  · Stay active, but balance activity with periods of rest. Exercise and physical activity will help you maintain your ability to do things you want to do.  · Preventing infection and hospitalization is very important when you have COPD. Make sure to receive all the vaccines your health care provider recommends, especially the pneumococcal and influenza vaccines. Ask your healthcare provider whether you   need a pneumonia vaccine.  · Learn and use relaxation techniques to manage stress.  · Learn and use controlled breathing techniques as directed by your health care provider. Controlled breathing techniques include:    · Pursed lip breathing. Start by breathing in (inhaling) through your nose for 1 second. Then, purse your lips as if you were going to whistle and breathe out (exhale)  through the pursed lips for 2 seconds.    · Diaphragmatic breathing. Start by putting one hand on your abdomen just above your waist. Inhale slowly through your nose. The hand on your abdomen should move out. Then purse your lips and exhale slowly. You should be able to feel the hand on your abdomen moving in as you exhale.    · Learn and use controlled coughing to clear mucus from your lungs. Controlled coughing is a series of short, progressive coughs. The steps of controlled coughing are:    1. Lean your head slightly forward.    2. Breathe in deeply using diaphragmatic breathing.    3. Try to hold your breath for 3 seconds.    4. Keep your mouth slightly open while coughing twice.    5. Spit any mucus out into a tissue.    6. Rest and repeat the steps once or twice as needed.  SEEK MEDICAL CARE IF:   · You are coughing up more mucus than usual.    · There is a change in the color or thickness of your mucus.    · Your breathing is more labored than usual.    · Your breathing is faster than usual.    SEEK IMMEDIATE MEDICAL CARE IF:   · You have shortness of breath while you are resting.    · You have shortness of breath that prevents you from:  · Being able to talk.    · Performing your usual physical activities.    · You have chest pain lasting longer than 5 minutes.    · Your skin color is more cyanotic than usual.  · You measure low oxygen saturations for longer than 5 minutes with a pulse oximeter.  MAKE SURE YOU:   · Understand these instructions.  · Will watch your condition.  · Will get help right away if you are not doing well or get worse.  Document Released: 06/24/2005 Document Revised: 07/05/2013 Document Reviewed: 05/11/2013  [employer]® Patient Information ©2014 [employer].

## 2013-11-03 ENCOUNTER — Inpatient Hospital Stay (HOSPITAL_COMMUNITY)
Admission: EM | Admit: 2013-11-03 | Discharge: 2013-11-08 | DRG: 190 | Disposition: A | Discharge status: Home / Self Care | Payer: Medicaid Other | Attending: Internal Medicine | Admitting: Internal Medicine

## 2013-11-03 DIAGNOSIS — R0600 Dyspnea, unspecified: Secondary | ICD-10-CM

## 2013-11-03 DIAGNOSIS — J441 Chronic obstructive pulmonary disease with (acute) exacerbation: Principal | ICD-10-CM | POA: Diagnosis present

## 2013-11-03 DIAGNOSIS — J449 Chronic obstructive pulmonary disease, unspecified: Secondary | ICD-10-CM

## 2013-11-03 DIAGNOSIS — E278 Other specified disorders of adrenal gland: Secondary | ICD-10-CM | POA: Diagnosis present

## 2013-11-03 DIAGNOSIS — K08109 Complete loss of teeth, unspecified cause, unspecified class: Secondary | ICD-10-CM | POA: Diagnosis present

## 2013-11-03 DIAGNOSIS — Z720 Tobacco use: Secondary | ICD-10-CM | POA: Diagnosis present

## 2013-11-03 DIAGNOSIS — E0590 Thyrotoxicosis, unspecified without thyrotoxic crisis or storm: Secondary | ICD-10-CM

## 2013-11-03 DIAGNOSIS — E279 Disorder of adrenal gland, unspecified: Secondary | ICD-10-CM | POA: Diagnosis present

## 2013-11-03 DIAGNOSIS — I509 Heart failure, unspecified: Secondary | ICD-10-CM | POA: Diagnosis present

## 2013-11-03 DIAGNOSIS — E43 Unspecified severe protein-calorie malnutrition: Secondary | ICD-10-CM

## 2013-11-03 DIAGNOSIS — K000 Anodontia: Secondary | ICD-10-CM

## 2013-11-03 DIAGNOSIS — E871 Hypo-osmolality and hyponatremia: Secondary | ICD-10-CM | POA: Diagnosis present

## 2013-11-03 DIAGNOSIS — I959 Hypotension, unspecified: Secondary | ICD-10-CM | POA: Diagnosis present

## 2013-11-03 DIAGNOSIS — E039 Hypothyroidism, unspecified: Secondary | ICD-10-CM | POA: Diagnosis present

## 2013-11-03 DIAGNOSIS — E236 Other disorders of pituitary gland: Secondary | ICD-10-CM | POA: Diagnosis present

## 2013-11-03 DIAGNOSIS — E41 Nutritional marasmus: Secondary | ICD-10-CM | POA: Diagnosis present

## 2013-11-03 DIAGNOSIS — F1010 Alcohol abuse, uncomplicated: Secondary | ICD-10-CM | POA: Diagnosis present

## 2013-11-03 DIAGNOSIS — F17200 Nicotine dependence, unspecified, uncomplicated: Secondary | ICD-10-CM | POA: Diagnosis present

## 2013-11-03 DIAGNOSIS — I5023 Acute on chronic systolic (congestive) heart failure: Secondary | ICD-10-CM | POA: Diagnosis present

## 2013-11-03 DIAGNOSIS — F319 Bipolar disorder, unspecified: Secondary | ICD-10-CM | POA: Diagnosis present

## 2013-11-03 NOTE — ED Notes (Signed)
Pt presents with c/o shortness of breath, O2 sats 99% RA and 100% with [place] at 2L. Pt has a hx of COPD, no other complaints at this time.

## 2013-11-03 NOTE — ED Notes (Signed)
Bed: FT73 Expected date: 11/03/13 Expected time: 11:50 PM Means of arrival: Ambulance Comments: 63 yo M  Shortness of breath  triage

## 2013-11-04 ENCOUNTER — Encounter (HOSPITAL_COMMUNITY): Payer: Self-pay | Admitting: Emergency Medicine

## 2013-11-04 ENCOUNTER — Inpatient Hospital Stay (HOSPITAL_COMMUNITY): Payer: Medicaid Other

## 2013-11-04 DIAGNOSIS — K08109 Complete loss of teeth, unspecified cause, unspecified class: Secondary | ICD-10-CM | POA: Diagnosis present

## 2013-11-04 DIAGNOSIS — Z720 Tobacco use: Secondary | ICD-10-CM | POA: Diagnosis present

## 2013-11-04 DIAGNOSIS — J449 Chronic obstructive pulmonary disease, unspecified: Secondary | ICD-10-CM

## 2013-11-04 DIAGNOSIS — K000 Anodontia: Secondary | ICD-10-CM

## 2013-11-04 DIAGNOSIS — E278 Other specified disorders of adrenal gland: Secondary | ICD-10-CM | POA: Diagnosis present

## 2013-11-04 DIAGNOSIS — E871 Hypo-osmolality and hyponatremia: Secondary | ICD-10-CM

## 2013-11-04 LAB — TSH: TSH: 0.085 u[IU]/mL — AB (ref 0.350–4.500)

## 2013-11-04 LAB — BASIC METABOLIC PANEL
BUN: 23 mg/dL (ref 6–23)
BUN: 21 mg/dL (ref 6–23)
BUN: 21 mg/dL (ref 6–23)
CALCIUM: 8.9 mg/dL (ref 8.4–10.5)
CO2: 24 mEq/L (ref 19–32)
CO2: 26 mEq/L (ref 19–32)
CO2: 27 mEq/L (ref 19–32)
CREATININE: 0.65 mg/dL (ref 0.50–1.35)
Calcium: 8.9 mg/dL (ref 8.4–10.5)
Calcium: 8.6 mg/dL (ref 8.4–10.5)
Chloride: 86 mEq/L — ABNORMAL LOW (ref 96–112)
Chloride: 92 mEq/L — ABNORMAL LOW (ref 96–112)
Chloride: 91 mEq/L — ABNORMAL LOW (ref 96–112)
Creatinine, Ser: 0.62 mg/dL (ref 0.50–1.35)
Creatinine, Ser: 0.66 mg/dL (ref 0.50–1.35)
GFR calc non Af Amer: >90 mL/min (ref >90)
GLUCOSE: 150 mg/dL — AB (ref 70–99)
Glucose, Bld: 142 mg/dL — ABNORMAL HIGH (ref 70–99)
Glucose, Bld: 153 mg/dL — ABNORMAL HIGH (ref 70–99)
POTASSIUM: 4.7 meq/L (ref 3.7–5.3)
POTASSIUM: 4.9 meq/L (ref 3.7–5.3)
Potassium: 4.4 mEq/L (ref 3.7–5.3)
SODIUM: 121 meq/L — AB (ref 137–147)
SODIUM: 127 meq/L — AB (ref 137–147)
Sodium: 127 mEq/L — ABNORMAL LOW (ref 137–147)

## 2013-11-04 LAB — D-DIMER, QUANTITATIVE (NOT AT ARMC): D-Dimer, Quant: 0.39 ug/mL-FEU (ref 0.00–0.48)

## 2013-11-04 LAB — SODIUM, URINE, RANDOM: SODIUM UR: 77 meq/L

## 2013-11-04 MED ORDER — IPRATROPIUM-ALBUTEROL 0.5-2.5 (3) MG/3ML IN SOLN
3.0000 mL | Freq: Four times a day (QID) | RESPIRATORY_TRACT | Status: DC | PRN
  Administered 2013-11-05: 3 mL via RESPIRATORY_TRACT
  Filled 2013-11-04: qty 9
  Filled 2013-11-04: qty 3

## 2013-11-04 MED ORDER — SODIUM CHLORIDE 0.9 % IV SOLN
INTRAVENOUS | Status: DC
  Administered 2013-11-04: 21:00:00 via INTRAVENOUS

## 2013-11-04 MED ORDER — IOHEXOL 300 MG/ML  SOLN
100.0000 mL | Freq: Once | INTRAMUSCULAR | Status: AC | PRN
  Administered 2013-11-04: 100 mL via INTRAVENOUS

## 2013-11-04 MED ORDER — TRAZODONE HCL 50 MG PO TABS
50.0000 mg | ORAL_TABLET | Freq: Every day | ORAL | Status: DC
  Administered 2013-11-05 – 2013-11-07 (×4): 50 mg via ORAL
  Filled 2013-11-04 (×5): qty 1

## 2013-11-04 MED ORDER — SODIUM CHLORIDE 0.9 % IV SOLN
INTRAVENOUS | Status: DC
  Administered 2013-11-04 (×2): via INTRAVENOUS

## 2013-11-04 MED ORDER — SODIUM CHLORIDE 0.9 % IJ SOLN
3.0000 mL | Freq: Two times a day (BID) | INTRAMUSCULAR | Status: DC
  Administered 2013-11-04 – 2013-11-08 (×7): 3 mL via INTRAVENOUS

## 2013-11-04 MED ORDER — NICOTINE 21 MG/24HR TD PT24
21.0000 mg | MEDICATED_PATCH | Freq: Every day | TRANSDERMAL | Status: DC
Start: 2013-11-04 — End: 2013-11-08
  Administered 2013-11-04 – 2013-11-08 (×5): 21 mg via TRANSDERMAL
  Filled 2013-11-04 (×5): qty 1

## 2013-11-04 MED ORDER — ALBUTEROL SULFATE (2.5 MG/3ML) 0.083% IN NEBU: 2.5000 mg | INHALATION_SOLUTION | Freq: Four times a day (QID) | RESPIRATORY_TRACT | Status: DC | PRN

## 2013-11-04 MED ORDER — HEPARIN SODIUM (PORCINE) 5000 UNIT/ML IJ SOLN
5000.0000 [IU] | Freq: Three times a day (TID) | INTRAMUSCULAR | Status: DC
  Administered 2013-11-04 – 2013-11-06 (×7): 5000 [IU] via SUBCUTANEOUS
  Filled 2013-11-04 (×16): qty 1

## 2013-11-04 MED ORDER — NICOTINE POLACRILEX 2 MG MT GUM
2.0000 mg | CHEWING_GUM | OROMUCOSAL | Status: DC | PRN
  Administered 2013-11-04: 2 mg via ORAL
  Filled 2013-11-04: qty 1

## 2013-11-04 MED ORDER — IPRATROPIUM-ALBUTEROL 20-100 MCG/ACT IN AERS: 1.0000 | INHALATION_SPRAY | Freq: Four times a day (QID) | RESPIRATORY_TRACT | Status: DC | PRN

## 2013-11-04 MED ORDER — ALBUTEROL SULFATE HFA 108 (90 BASE) MCG/ACT IN AERS
1.0000 | INHALATION_SPRAY | Freq: Four times a day (QID) | RESPIRATORY_TRACT | Status: DC | PRN
  Filled 2013-11-04: qty 6.7

## 2013-11-04 MED ORDER — PREDNISONE 50 MG PO TABS
50.0000 mg | ORAL_TABLET | Freq: Every day | ORAL | Status: DC
  Administered 2013-11-04 – 2013-11-05 (×2): 50 mg via ORAL
  Filled 2013-11-04 (×3): qty 1

## 2013-11-04 NOTE — H&P (Signed)
[facility] History and Physical  MAXSON ODDO BOF:[id number] DOB: 13-Jan-1951 DOA: 11/03/2013  Referring physician: EDP PCP: [name], MD   Chief Complaint: SOB   HPI: Anthony Bolton is a 63 y.o. male h/o COPD presents to ED with SOB.  This is the second time this week with identical presentation.  Patient was evaluated in ED and once again, was not found to have an overly severe COPD exacerbation (no wheezing, satting 99% on room air); however, just like 3 days ago, he was found to have hyponatremia with sodium of 120 (was 128 on discharge on 2/3).  Once again will re-admit for treatment of hyponatremia.  Review of Systems: Systems reviewed.  As above, otherwise negative  Past Medical History  Diagnosis Date  . COPD (chronic obstructive pulmonary disease)    Past Surgical History  Procedure Laterality Date  . Appendectomy     Social History:  reports that he has been smoking Cigarettes.  He has been smoking about 0.00 packs per day. He has never used smokeless tobacco. He reports that he does not drink alcohol or use illicit drugs.  No Known Allergies  History reviewed. No pertinent family history.   Prior to Admission medications   Medication Sig Start Date End Date Taking? Authorizing Provider  albuterol (PROVENTIL HFA;VENTOLIN HFA) 108 (90 BASE) MCG/ACT inhaler Inhale 1-2 puffs into the lungs every 6 (six) hours as needed for wheezing or shortness of breath. 10/30/13  Yes Varney Biles, MD  tadalafil (CIALIS) 5 MG tablet Take 5 mg by mouth daily as needed for erectile dysfunction (ED).   Yes Historical Provider, MD  traZODone (DESYREL) 50 MG tablet Take 50 mg by mouth at bedtime.   Yes Historical Provider, MD  predniSONE (DELTASONE) 50 MG tablet Take 1 tablet (50 mg total) by mouth daily. 10/25/13   Kathalene Frames, MD   Physical Exam: Filed Vitals:   11/04/13 0154  BP: 112/91  Pulse: 111  Temp:   Resp:     BP 112/91  Pulse 111  Temp(Src) 98.4 F (36.9 C)  (Oral)  Resp 20  SpO2 100%  General Appearance:    Alert, oriented, no distress, appears stated age, cachectic, very hard of hearing  Head:    Normocephalic, atraumatic  Eyes:    PERRL, EOMI, sclera non-icteric        Nose:   Nares without drainage or epistaxis. Mucosa, turbinates normal  Throat:   Moist mucous membranes. Oropharynx without erythema or exudate.  Neck:   Supple. No carotid bruits.  No thyromegaly.  No lymphadenopathy.   Back:     No CVA tenderness, no spinal tenderness  Lungs:     Clear to auscultation bilaterally, without wheezes, rhonchi or rales  Chest wall:    No tenderness to palpitation  Heart:    Regular rate and rhythm without murmurs, gallops, rubs  Abdomen:     Soft, non-tender, nondistended, normal bowel sounds, no organomegaly  Genitalia:    deferred  Rectal:    deferred  Extremities:   No clubbing, cyanosis or edema.  Pulses:   2+ and symmetric all extremities  Skin:   Skin color, texture, turgor normal, no rashes or lesions  Lymph nodes:   Cervical, supraclavicular, and axillary nodes normal  Neurologic:   CNII-XII intact. Normal strength, sensation and reflexes      throughout    Labs on Admission:  Basic Metabolic Panel:  Recent Labs Lab 10/31/13 0810 10/31/13 1813 10/31/13 2351 11/01/13 7782  11/04/13 0034  NA 120* 124* 125* 128* 121*  K 4.1 4.6 4.5 4.3 4.9  CL 84* 87* 89* 94* 86*  CO2 22 23 24 22 24   GLUCOSE 115* 151* 133* 105* 150*  BUN 20 20 18 15 23   CREATININE 0.65 0.60 0.60 0.59 0.62  CALCIUM 8.9 8.7 8.7 8.6 8.9   Liver Function Tests: No results found for this basename: AST, ALT, ALKPHOS, BILITOT, PROT, ALBUMIN,  in the last 168 hours No results found for this basename: LIPASE, AMYLASE,  in the last 168 hours No results found for this basename: AMMONIA,  in the last 168 hours CBC:  Recent Labs Lab 10/30/13 0040 10/31/13 0450  WBC 8.3 12.2*  NEUTROABS 5.1 8.5*  HGB 11.8* 11.3*  HCT 33.0* 30.9*  MCV 82.5 81.1  PLT 305  294   Cardiac Enzymes:  Recent Labs Lab 10/30/13 0040  TROPONINI <0.30    BNP (last 3 results)  Recent Labs  10/25/13 1320 10/30/13 0040  PROBNP 193.6* 147.2*   CBG: No results found for this basename: GLUCAP,  in the last 168 hours  Radiological Exams on Admission: No results found.  EKG: Independently reviewed.  Assessment/Plan Principal Problem:   Hyponatremia Active Problems:   COPD (chronic obstructive pulmonary disease)   1. Hyponatremia - apparently dehydration / poor PO salt intake.  Harvey his trazodone does not appear to have corrected his hyponatremia.  Patient states he drinks a lot of coffee and tea, I suspect that this is the cause at this point.  Work up last time demonstrated appropriately low urine sodium, essentially ruling out SIADH. 2. COPD - will continue prednisone, d.dimer pending at this time as he is tachycardic this time.  Once again as with last time his symptoms seem out of proportion to his findings.  2d echo also ordered to see if there may be a cardiogenic component.    Code Status: Full  Family Communication: No family in room Disposition Plan: Admit to inpatient   Time spent: 50 min  Honour Schwieger M. [facility] Pager [phone]  If 7AM-7PM, please contact the day team taking care of the patient [email or web address] Password [facility] 11/04/2013, 2:46 AM

## 2013-11-04 NOTE — Progress Notes (Signed)
UR completed 

## 2013-11-04 NOTE — ED Provider Notes (Signed)
CSN: [id number]     Arrival date & time 11/03/13  2355 History   First MD Initiated Contact with Patient 11/04/13 0002     Chief Complaint  Patient presents with  . Shortness of Breath   (Consider location/radiation/quality/duration/timing/severity/associated sxs/prior Treatment) HPI 63 year old male presents emergency part from EMS with complaint of shortness of breath.  Patient has history of COPD.  He has had multiple visits to the ED recently for shortness of breath, also noted to have hyponatremia.  He was admitted over the weekend for same.  He reports tonight.  He felt more short of breath.  Usual, felt too weak to get up.  He reports having episodes urinary incontinence, as he was too weak to the bathroom.  Ulcers, reports spilling milk all over himself.  Patient is negative.  Followup with his primary care Dr., has appointment on March 3.  No fevers no chills.  No cough.  He, reports he's been taking his albuterol and Combivent as prescribed.  He finished his prednisone today. Past Medical History  Diagnosis Date  . COPD (chronic obstructive pulmonary disease)    Past Surgical History  Procedure Laterality Date  . Appendectomy     History reviewed. No pertinent family history. History  Substance Use Topics  . Smoking status: Current Every Day Smoker    Types: Cigarettes  . Smokeless tobacco: Never Used  . Alcohol Use: No    Review of Systems  See History of Present Illness; otherwise all other systems are reviewed and negative Allergies  Review of patient's allergies indicates no known allergies.  Home Medications   Current Outpatient Rx  Name  Route  Sig  Dispense  Refill  . albuterol (PROVENTIL HFA;VENTOLIN HFA) 108 (90 BASE) MCG/ACT inhaler   Inhalation   Inhale 1-2 puffs into the lungs every 6 (six) hours as needed for wheezing or shortness of breath.   1 Inhaler   0   . aspirin 325 MG tablet   Oral   Take 325 mg by mouth every 6 (six) hours as needed.          . Ipratropium-Albuterol (COMBIVENT) 20-100 MCG/ACT AERS respimat   Inhalation   Inhale 1 puff into the lungs every 6 (six) hours as needed for wheezing.   1 Inhaler   0   . ipratropium-albuterol (DUONEB) 0.5-2.5 (3) MG/3ML SOLN   Inhalation   Inhale 3 mLs into the lungs every 6 (six) hours as needed.   360 mL   0   . predniSONE (DELTASONE) 50 MG tablet   Oral   Take 1 tablet (50 mg total) by mouth daily.   5 tablet   0     Dispense as written.   . tadalafil (CIALIS) 5 MG tablet   Oral   Take 5 mg by mouth daily as needed for erectile dysfunction (ED).         . traZODone (DESYREL) 50 MG tablet   Oral   Take 50 mg by mouth at bedtime.          BP 138/109  Pulse 111  Temp(Src) 98.4 F (36.9 C) (Oral)  Resp 20  SpO2 100% Physical Exam  Nursing note and vitals reviewed. Constitutional: He is oriented to person, place, and time. He appears well-developed and well-nourished. He appears distressed (anxious appearing).  HENT:  Head: Normocephalic and atraumatic.  Right Ear: External ear normal.  Left Ear: External ear normal.  Nose: Nose normal.  Mouth/Throat: Oropharynx is clear and moist.  Eyes: Conjunctivae and EOM are normal. Pupils are equal, round, and reactive to light.  Neck: Normal range of motion. Neck supple. No JVD present. No tracheal deviation present. No thyromegaly present.  Cardiovascular: Normal rate, regular rhythm, normal heart sounds and intact distal pulses.  Exam reveals no gallop and no friction rub.   No murmur heard. Pulmonary/Chest: Breath sounds normal. No stridor. No respiratory distress. He has no wheezes. He has no rales. He exhibits no tenderness.  Some accessory muscle use, but no wheezing, cough, or hypoxia  Abdominal: Soft. Bowel sounds are normal. He exhibits no distension and no mass. There is no tenderness. There is no rebound and no guarding.  Musculoskeletal: Normal range of motion. He exhibits no edema and no tenderness.   Lymphadenopathy:    He has no cervical adenopathy.  Neurological: He is alert and oriented to person, place, and time. He exhibits normal muscle tone. Coordination normal.  Skin: Skin is warm and dry. No rash noted. No erythema. No pallor.  Psychiatric: He has a normal mood and affect. His behavior is normal. Judgment and thought content normal.    ED Course  Procedures (including critical care time) Labs Review Labs Reviewed  BASIC METABOLIC PANEL - Abnormal; Notable for the following:    Sodium 121 (*)    Chloride 86 (*)    Glucose, Bld 150 (*)    All other components within normal limits  BASIC METABOLIC PANEL  BASIC METABOLIC PANEL  BASIC METABOLIC PANEL  BASIC METABOLIC PANEL  BASIC METABOLIC PANEL  D-DIMER, QUANTITATIVE   Imaging Review No results found.  EKG Interpretation    Date/Time:  Saturday November 04 2013 00:10:14 EST Ventricular Rate:  112 PR Interval:  157 QRS Duration: 86 QT Interval:  298 QTC Calculation: 407 R Axis:   63 Text Interpretation:  Sinus tachycardia Anteroseptal infarct, old Minimal ST elevation, inferior leads HEART RATE INCREASED SINCE last ekg Confirmed by Davan Nawabi  MD, Dae Highley ([id number]) on 11/04/2013 1:03:30 AM            MDM   1. Hyponatremia   2. Dyspnea    63 year old male with reported shortness of breath.  He has mild accessory muscle use.  He does not have hypoxia, or wheezing.  We'll recheck his sodium as he has not yet had a chance to have it rechecked since discharge.  Will ambulate without oxygen.  I suspect he has an aspect of anxiety to his shortness of breath.    Kalman Drape, MD 11/04/13 0300

## 2013-11-04 NOTE — ED Notes (Signed)
Pulse oximetry while ambulating: Although the pt maintained O2 sats of 98-100% on room, the pt was dizzy and unable to make it back to room, stating he was dizzy and felt weak. HR ranged 120-125.

## 2013-11-04 NOTE — Progress Notes (Signed)
12:10 PM I agree with HPI/GPe and A/P per Dr. Alcario Drought      63 y/o ?, known GOLD COPD undifferentiated, Hyponatremia as low as 117 on 10/31/13-recently admitted and d/c, Impaired glucose tolerance who re-presented 11/04/13 with worsening SOB in a setting of completing Prednisone, found once again to have hyponatremia of 127 He smokes ~2 ppd since age 68 Has lost 20-30 lbs since teeth extraction 8/14. His appetite however is good. He has no dentures U osm= 200, U sod=26 Smoking in room today Asks for patch HEENT cachetic hoh cm, nad CHEST clear, no added sound CARDIAC s1 s2 no m/r/g ABDOMEN soft, scaphoid   Patient Active Problem List   Diagnosis Date Noted  . Hyponatremia 10/31/2013  . COPD (chronic obstructive pulmonary disease) 10/31/2013   Hyponatremia-euvolemic.  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  Await cortisol and tsh ordere this am Rest as per Dr. Alcario Drought.  [name], MD [facility] [phone]

## 2013-11-04 NOTE — ED Notes (Signed)
Dr Otter at bedside  

## 2013-11-05 DIAGNOSIS — R0602 Shortness of breath: Secondary | ICD-10-CM

## 2013-11-05 DIAGNOSIS — J449 Chronic obstructive pulmonary disease, unspecified: Secondary | ICD-10-CM

## 2013-11-05 LAB — BASIC METABOLIC PANEL
BUN: 22 mg/dL (ref 6–23)
CALCIUM: 8.9 mg/dL (ref 8.4–10.5)
CO2: 27 meq/L (ref 19–32)
Chloride: 95 mEq/L — ABNORMAL LOW (ref 96–112)
Creatinine, Ser: 0.76 mg/dL (ref 0.50–1.35)
GFR calc Af Amer: >90 mL/min (ref >90)
Glucose, Bld: 144 mg/dL — ABNORMAL HIGH (ref 70–99)
Potassium: 4 mEq/L (ref 3.7–5.3)
Sodium: 124 mEq/L — ABNORMAL LOW (ref 137–147)

## 2013-11-05 LAB — CBC WITH DIFFERENTIAL/PLATELET
BASOS ABS: 0 10*3/uL (ref 0.0–0.1)
Basophils Relative: 0 % (ref 0–1)
Eosinophils Absolute: 0 10*3/uL (ref 0.0–0.7)
Eosinophils Relative: 0 % (ref 0–5)
HCT: 38.1 % — ABNORMAL LOW (ref 39.0–52.0)
Hemoglobin: 13.4 g/dL (ref 13.0–17.0)
LYMPHS PCT: 6 % — AB (ref 12–46)
Lymphs Abs: 0.9 10*3/uL (ref 0.7–4.0)
MCH: 29.5 pg (ref 26.0–34.0)
MCHC: 35.2 g/dL (ref 30.0–36.0)
MCV: 83.9 fL (ref 78.0–100.0)
Monocytes Absolute: 0.5 10*3/uL (ref 0.1–1.0)
Monocytes Relative: 3 % (ref 3–12)
NEUTROS PCT: 91 % — AB (ref 43–77)
Neutro Abs: 14.5 10*3/uL — ABNORMAL HIGH (ref 1.7–7.7)
PLATELETS: 313 10*3/uL (ref 150–400)
RBC: 4.54 MIL/uL (ref 4.22–5.81)
RDW: 15.7 % — AB (ref 11.5–15.5)
WBC: 15.9 10*3/uL — AB (ref 4.0–10.5)

## 2013-11-05 LAB — TROPONIN I

## 2013-11-05 LAB — PRO B NATRIURETIC PEPTIDE: PRO B NATRI PEPTIDE: 24700 pg/mL — AB (ref 0–125)

## 2013-11-05 MED ORDER — DIGOXIN 0.25 MG/ML IJ SOLN
0.2500 mg | INTRAMUSCULAR | Status: AC
  Administered 2013-11-05 (×2): 0.25 mg via INTRAVENOUS
  Filled 2013-11-05 (×3): qty 1

## 2013-11-05 MED ORDER — ATORVASTATIN CALCIUM 80 MG PO TABS
80.0000 mg | ORAL_TABLET | Freq: Every day | ORAL | Status: DC
  Administered 2013-11-05 – 2013-11-07 (×3): 80 mg via ORAL
  Filled 2013-11-05 (×4): qty 1

## 2013-11-05 MED ORDER — LORAZEPAM 1 MG PO TABS
ORAL_TABLET | ORAL | Status: AC
  Filled 2013-11-05: qty 1

## 2013-11-05 MED ORDER — CARVEDILOL 3.125 MG PO TABS
3.1250 mg | ORAL_TABLET | Freq: Two times a day (BID) | ORAL | Status: DC
  Administered 2013-11-05 – 2013-11-06 (×2): 3.125 mg via ORAL
  Filled 2013-11-05 (×4): qty 1

## 2013-11-05 MED ORDER — RAMIPRIL 1.25 MG PO CAPS
1.2500 mg | ORAL_CAPSULE | Freq: Every day | ORAL | Status: DC
  Administered 2013-11-05: 1.25 mg via ORAL
  Filled 2013-11-05 (×2): qty 1

## 2013-11-05 MED ORDER — ALUM & MAG HYDROXIDE-SIMETH 200-200-20 MG/5ML PO SUSP
15.0000 mL | ORAL | Status: DC | PRN
  Administered 2013-11-05: 15 mL via ORAL
  Filled 2013-11-05: qty 30

## 2013-11-05 MED ORDER — DIGOXIN 125 MCG PO TABS
0.1250 mg | ORAL_TABLET | Freq: Every day | ORAL | Status: DC
  Administered 2013-11-06 – 2013-11-08 (×3): 0.125 mg via ORAL
  Filled 2013-11-05 (×3): qty 1

## 2013-11-05 MED ORDER — PREDNISONE 20 MG PO TABS
20.0000 mg | ORAL_TABLET | Freq: Every day | ORAL | Status: DC
  Administered 2013-11-06: 20 mg via ORAL
  Filled 2013-11-05: qty 1

## 2013-11-05 MED ORDER — LORAZEPAM 1 MG PO TABS
1.0000 mg | ORAL_TABLET | Freq: Once | ORAL | Status: AC
  Administered 2013-11-05: 1 mg via ORAL

## 2013-11-05 MED ORDER — CLOPIDOGREL BISULFATE 75 MG PO TABS
75.0000 mg | ORAL_TABLET | Freq: Every day | ORAL | Status: DC
Start: 2013-11-05 — End: 2013-11-07
  Administered 2013-11-05 – 2013-11-07 (×3): 75 mg via ORAL
  Filled 2013-11-05 (×5): qty 1

## 2013-11-05 MED ORDER — ASPIRIN EC 81 MG PO TBEC
81.0000 mg | DELAYED_RELEASE_TABLET | Freq: Every day | ORAL | Status: DC
  Administered 2013-11-05 – 2013-11-08 (×2): 81 mg via ORAL
  Filled 2013-11-05 (×3): qty 1

## 2013-11-05 MED ORDER — FUROSEMIDE 10 MG/ML IJ SOLN
20.0000 mg | Freq: Once | INTRAMUSCULAR | Status: AC
  Administered 2013-11-05: 20 mg via INTRAVENOUS
  Filled 2013-11-05: qty 2

## 2013-11-05 NOTE — Progress Notes (Signed)
Note: This document was prepared with digital dictation and possible smart phrase technology. Any transcriptional errors that result from this process are unintentional.   Anthony Bolton SVX:[id number] DOB: 1951/08/04 DOA: 11/03/2013 PCP: [name], MD  Brief narrative: 62 y/o ?, known GOLD COPD undifferentiated, Hyponatremia as low as 117 on 10/31/13-recently admitted and d/c, Impaired glucose tolerance who re-presented 11/04/13 with worsening SOB in a setting of completing Prednisone, found once again to have hyponatremia of 127 He smokes ~2 ppd since age 53 Has lost 20-30 lbs since teeth extraction 8/14. His appetite however is good.He has no dentures U osm= 200, U sod=26  Past medical history-As per Problem list Chart reviewed as below-  Consultants:  Cardiology   Procedures:  NOne   Antibiotics:  None   Subjective  Events overnight noted-patient was agitated and belligerent-thinks he needs his trazodone Patient reports for this morning No specific chest pain but is circuitous when asked how he is feeling He seems to be preoccupied with with his remaining time on earth Denies overt chest pain states he is coughing up some sputum. Drinking a lot of fluid   Objective    Interim History: None  Telemetry: None   Objective: Filed Vitals:   11/04/13 2039 11/05/13 0041 11/05/13 0700 11/05/13 1324  BP: 142/106  133/97 111/79  Pulse: 118  112 117  Temp:   98.1 F (36.7 C) 97.7 F (36.5 C)  TempSrc:   Oral Oral  Resp:   18 18  Height:      Weight:      SpO2:  97% 99% 98%    Intake/Output Summary (Last 24 hours) at 11/05/13 1353 Last data filed at 11/05/13 0831  Gross per 24 hour  Intake   1085 ml  Output      0 ml  Net   1085 ml    Exam:  General: EOMI, NCAT Cardiovascular: S1-S2 slightly tachycardic Respiratory: Clinically clear Abdomen: Soft nontender nondistended no rebound Skin no lower extremity edema Neuro grossly intact  Data  Reviewed: Basic Metabolic Panel:  Recent Labs Lab 10/31/13 2351 11/01/13 0608 11/04/13 0034 11/04/13 0551 11/04/13 0957  NA 125* 128* 121* 127* 127*  K 4.5 4.3 4.9 4.7 4.4  CL 89* 94* 86* 92* 91*  CO2 24 22 24 26 27   GLUCOSE 133* 105* 150* 142* 153*  BUN 18 15 23 21 21   CREATININE 0.60 0.59 0.62 0.66 0.65  CALCIUM 8.7 8.6 8.9 8.9 8.6   Liver Function Tests: No results found for this basename: AST, ALT, ALKPHOS, BILITOT, PROT, ALBUMIN,  in the last 168 hours No results found for this basename: LIPASE, AMYLASE,  in the last 168 hours No results found for this basename: AMMONIA,  in the last 168 hours CBC:  Recent Labs Lab 10/30/13 0040 10/31/13 0450  WBC 8.3 12.2*  NEUTROABS 5.1 8.5*  HGB 11.8* 11.3*  HCT 33.0* 30.9*  MCV 82.5 81.1  PLT 305 294   Cardiac Enzymes:  Recent Labs Lab 10/30/13 0040  TROPONINI <0.30   BNP: No components found with this basename: POCBNP,  CBG: No results found for this basename: GLUCAP,  in the last 168 hours  No results found for this or any previous visit (from the past 240 hour(s)).   Studies:              All Imaging reviewed and is as per above notation   Scheduled Meds: . heparin  5,000 Units Subcutaneous Q8H  . nicotine  21 mg Transdermal Daily  . predniSONE  50 mg Oral Daily  . sodium chloride  3 mL Intravenous Q12H  . traZODone  50 mg Oral QHS   Continuous Infusions: . sodium chloride 75 mL/hr at 11/04/13 2047     Assessment/Plan: 1. Euvolemic hyponatremia-SIADH +/- excess fluid vs. solid intake vs. CHF vs. Hypothyroidism.  Echocardiogram 2/8 = severe hypokinesis, He will need fluid restriction and is aware of this 2. Cardiomyopathy, severe-unclear etiology-F 20%. Cardiology will evaluate him-he might need cardiac catheterization-no overt same symptoms of CHF at present-might benefit from selective beta blocker, Aldactone 3. Gold stage 3-4 COPD-clinically not wheezing, albuterol 2.5 every 6 when necessary, DuoNeb  every 6 when necessary.  Will need outpatient controller medications and more importantly will need to quit smoking.  Wean steroids quickly given behavioral disturbances and concern for possible steroid psychosis overnight last night 4. Possible bipolar-continue trazodone 50 mg each bedtime 5. Hypothyroidism-TSH 0.085-get T3  T4-unclear how to work this up in the inpatient setting-may need close followup as an out patien 6. tBody mass index is 16.37 kg/(m^2).-will need outpatient dentist to reassess him. I suspect his weight loss may be secondary to not able to eat enough 7. Left adrenal incidentaloma-MRI characterization to be done today   Code Status: Full  Family Communication: None at bedside  Disposition Plan: Inpatient pending workup   [name], MD  [facility] Pager [phone] 11/05/2013, 1:53 PM    LOS: 2 days

## 2013-11-05 NOTE — Consult Note (Signed)
Reason for Consult: Markedly depressed LV systolic function/CHF Referring Physician: [facility]  Anthony Bolton is an 63 y.o. male.  HPI: Patient is 63 year old male with past rectal history significant for COPD, history of tobacco abuse approximately 70 pack years quit 20 years , was admitted because of progressive increasing shortness of breath and was noted to be hyponatremic and was treated for exacerbation of COPD. Cardiologic consultation is called as patient had 2-D echo which showed EF of approximately 20-25% with global hypokinesia. Patient does complain off vague sharp chest pain off and on for last few weeks but did not seek any medical attention EKG done in the ER showed possible old anteroseptal wall MI . Patient states he used to drink on weekends quit 20+ years ago   and used to smoke 2-2-1/2 packs per day for 30+ years quit approximately 20 years ago. Denies any cardiac workup in the past. Denies any palpitation lightheadedness or syncope. Denies any PND orthopnea or leg swelling  Past Medical History  Diagnosis Date  . COPD (chronic obstructive pulmonary disease)     Past Surgical History  Procedure Laterality Date  . Appendectomy      History reviewed. No pertinent family history.  Social History:  reports that he has been smoking Cigarettes.  He has been smoking about 0.00 packs per day. He has never used smokeless tobacco. He reports that he does not drink alcohol or use illicit drugs.  Allergies: No Known Allergies  Medications: I have reviewed the patient's current medications.  Results for orders placed during the hospital encounter of 11/03/13 (from the past 48 hour(s))  BASIC METABOLIC PANEL     Status: Abnormal   Collection Time    11/04/13 12:34 AM      Result Value Range   Sodium 121 (*) 137 - 147 mEq/L   Comment: CRITICAL RESULT CALLED TO, READ BACK BY AND VERIFIED WITH:     ADKINS,L RN _0  ON 02.07.2015 BY MCREYNOLDS,B   Potassium 4.9  3.7 -  5.3 mEq/L   Chloride 86 (*) 96 - 112 mEq/L   CO2 24  19 - 32 mEq/L   Glucose, Bld 150 (*) 70 - 99 mg/dL   BUN 23  6 - 23 mg/dL   Creatinine, Ser 0.62  0.50 - 1.35 mg/dL   Calcium 8.9  8.4 - 10.5 mg/dL   GFR calc non Af Amer >90  >90 mL/min   GFR calc Af Amer >90  >90 mL/min   Comment: (NOTE)     The eGFR has been calculated using the CKD EPI equation.     This calculation has not been validated in all clinical situations.     eGFR's persistently <90 mL/min signify possible Chronic Kidney     Disease.  D-DIMER, QUANTITATIVE     Status: None   Collection Time    11/04/13  2:55 AM      Result Value Range   D-Dimer, Quant 0.39  0.00 - 0.48 ug/mL-FEU   Comment:            AT THE INHOUSE ESTABLISHED CUTOFF     VALUE OF 0.48 ug/mL FEU,     THIS ASSAY HAS BEEN DOCUMENTED     IN THE LITERATURE TO HAVE     A SENSITIVITY AND NEGATIVE     PREDICTIVE VALUE OF AT LEAST     98 TO 99%.  THE TEST RESULT     SHOULD BE CORRELATED WITH     AN  ASSESSMENT OF THE CLINICAL     PROBABILITY OF DVT / VTE.  BASIC METABOLIC PANEL     Status: Abnormal   Collection Time    11/04/13  5:51 AM      Result Value Range   Sodium 127 (*) 137 - 147 mEq/L   Potassium 4.7  3.7 - 5.3 mEq/L   Chloride 92 (*) 96 - 112 mEq/L   CO2 26  19 - 32 mEq/L   Glucose, Bld 142 (*) 70 - 99 mg/dL   BUN 21  6 - 23 mg/dL   Creatinine, Ser 0.66  0.50 - 1.35 mg/dL   Calcium 8.9  8.4 - 10.5 mg/dL   GFR calc non Af Amer >90  >90 mL/min   GFR calc Af Amer >90  >90 mL/min   Comment: (NOTE)     The eGFR has been calculated using the CKD EPI equation.     This calculation has not been validated in all clinical situations.     eGFR's persistently <90 mL/min signify possible Chronic Kidney     Disease.  BASIC METABOLIC PANEL     Status: Abnormal   Collection Time    11/04/13  9:57 AM      Result Value Range   Sodium 127 (*) 137 - 147 mEq/L   Potassium 4.4  3.7 - 5.3 mEq/L   Chloride 91 (*) 96 - 112 mEq/L   CO2 27  19 - 32 mEq/L    Glucose, Bld 153 (*) 70 - 99 mg/dL   BUN 21  6 - 23 mg/dL   Creatinine, Ser 0.65  0.50 - 1.35 mg/dL   Calcium 8.6  8.4 - 10.5 mg/dL   GFR calc non Af Amer >90  >90 mL/min   GFR calc Af Amer >90  >90 mL/min   Comment: (NOTE)     The eGFR has been calculated using the CKD EPI equation.     This calculation has not been validated in all clinical situations.     eGFR's persistently <90 mL/min signify possible Chronic Kidney     Disease.  SODIUM, URINE, RANDOM     Status: None   Collection Time    11/04/13  1:22 PM      Result Value Range   Sodium, Ur 77     Comment: Performed at [facility]  TSH     Status: Abnormal   Collection Time    11/04/13  2:03 PM      Result Value Range   TSH 0.085 (*) 0.350 - 4.500 uIU/mL   Comment: Performed at [place]     Status: Abnormal   Collection Time    11/05/13  2:35 PM      Result Value Range   Sodium 124 (*) 137 - 147 mEq/L   Potassium 4.0  3.7 - 5.3 mEq/L   Chloride 95 (*) 96 - 112 mEq/L   CO2 27  19 - 32 mEq/L   Glucose, Bld 144 (*) 70 - 99 mg/dL   BUN 22  6 - 23 mg/dL   Creatinine, Ser 0.76  0.50 - 1.35 mg/dL   Calcium 8.9  8.4 - 10.5 mg/dL   GFR calc non Af Amer >90  >90 mL/min   GFR calc Af Amer >90  >90 mL/min   Comment: (NOTE)     The eGFR has been calculated using the CKD EPI equation.     This calculation has not been validated  in all clinical situations.     eGFR's persistently <90 mL/min signify possible Chronic Kidney     Disease.  CBC WITH DIFFERENTIAL     Status: Abnormal   Collection Time    11/05/13  2:35 PM      Result Value Range   WBC 15.9 (*) 4.0 - 10.5 K/uL   RBC 4.54  4.22 - 5.81 MIL/uL   Hemoglobin 13.4  13.0 - 17.0 g/dL   HCT 38.1 (*) 39.0 - 52.0 %   MCV 83.9  78.0 - 100.0 fL   MCH 29.5  26.0 - 34.0 pg   MCHC 35.2  30.0 - 36.0 g/dL   RDW 15.7 (*) 11.5 - 15.5 %   Platelets 313  150 - 400 K/uL   Neutrophils Relative % 91 (*) 43 - 77 %   Neutro Abs 14.5 (*) 1.7  - 7.7 K/uL   Lymphocytes Relative 6 (*) 12 - 46 %   Lymphs Abs 0.9  0.7 - 4.0 K/uL   Monocytes Relative 3  3 - 12 %   Monocytes Absolute 0.5  0.1 - 1.0 K/uL   Eosinophils Relative 0  0 - 5 %   Eosinophils Absolute 0.0  0.0 - 0.7 K/uL   Basophils Relative 0  0 - 1 %   Basophils Absolute 0.0  0.0 - 0.1 K/uL  PRO B NATRIURETIC PEPTIDE     Status: Abnormal   Collection Time    11/05/13  2:35 PM      Result Value Range   Pro B Natriuretic peptide (BNP) 24700.0 (*) 0 - 125 pg/mL  TROPONIN I     Status: None   Collection Time    11/05/13  2:35 PM      Result Value Range   Troponin I <0.30  <0.30 ng/mL   Comment:            Due to the release kinetics of cTnI,     a negative result within the first hours     of the onset of symptoms does not rule out     myocardial infarction with certainty.     If myocardial infarction is still suspected,     repeat the test at appropriate intervals.    Ct Chest W Contrast  11/04/2013   CLINICAL DATA:  COPD.  Shortness of Breath.  Evaluate for cancer.  EXAM: CT CHEST WITH CONTRAST  TECHNIQUE: Multidetector CT imaging of the chest was performed during intravenous contrast administration.  CONTRAST:  160m OMNIPAQUE IOHEXOL 300 MG/ML  SOLN  COMPARISON:  DG CHEST 2 VIEW dated 10/30/2013  FINDINGS: Moderate to severe centrilobular emphysema. Linear scarring in the lung bases. No pulmonary nodules or masses. No confluent airspace opacities. No effusions.  Heart is normal size. Aorta is normal caliber. No mediastinal, hilar, or axillary adenopathy. Chest wall soft tissues are unremarkable.  Left adrenal mass measures 3.9 cm. There appears to be internal enhancement. This is nonspecific on this contrast chest CT and warrants additional followup. No acute findings in the upper abdomen.  No acute bony abnormality.  IMPRESSION: Moderate to advanced centrilobular emphysema.  No suspicious nodules or lesions within the lungs.  3.9 cm heterogeneous left adrenal mass. This  warrants additional workup and characterization. Recommend adrenal protocol MRI with and without contrast for further characterization.   Electronically Signed   By: KRolm BaptiseM.D.   On: 11/04/2013 14:41    Review of Systems  Constitutional: Negative for fever and chills.  Eyes: Negative for blurred vision, double vision and photophobia.  Cardiovascular: Positive for chest pain. Negative for palpitations, orthopnea and claudication.  Gastrointestinal: Negative for nausea, vomiting and abdominal pain.  Genitourinary: Negative for dysuria.  Neurological: Negative for dizziness and headaches.   Blood pressure 111/79, pulse 117, temperature 97.7 F (36.5 C), temperature source Oral, resp. rate 18, height _0  (1.753 m), weight 50.3 kg (110 lb 14.3 oz), SpO2 98.00%. Physical Exam  Constitutional: He is oriented to person, place, and time.  HENT:  Head: Normocephalic and atraumatic.  Eyes: Conjunctivae are normal. Pupils are equal, round, and reactive to light. Left eye exhibits no discharge. No scleral icterus.  Neck: Normal range of motion. Neck supple. No JVD present. No tracheal deviation present. No thyromegaly present.  Cardiovascular:  Regular rate and rhythm tachycardic S1 and S2 soft soft S3 gallop  Respiratory:  Decreased breath sound at bases with faint rales  GI: Soft. Bowel sounds are normal. He exhibits no distension. There is no tenderness. There is no rebound.  Musculoskeletal: He exhibits no edema and no tenderness.  Neurological: He is alert and oriented to person, place, and time.    Assessment/Plan: Resolving exacerbation of COPD Probable recent anteroseptal wall MI Probable ischemic cardiomyopathy Decompensated systolic heart failure History of tobacco abuse and alcohol abuse Adrenal mass Plan Add aspirin Plavix beta blockers ACE statins and digoxin as per orders Discussed with patient regarding cardiac cath his risk and benefits i.e. death MI stroke need for  emergency CABG local vascular complications etc. and consents for PCI Will schedule him for cardiac cath possible PTCA stenting for Tuesday  Zameer Borman N 11/05/2013, 3:34 PM

## 2013-11-05 NOTE — Progress Notes (Signed)
  Echocardiogram 2D Echocardiogram has been performed.  Janalee Dane M 11/05/2013, 9:47 AM

## 2013-11-05 NOTE — Progress Notes (Signed)
Pulled out iv refusing tolet [place] put another one in .  Is NOT on telemetry, does not have any iv meds.  Will keep it out for now

## 2013-11-05 NOTE — Progress Notes (Signed)
Pt became very agitated and upset, could not explain why he felt this way.  Pt was very rude and non-compliant towards nursing staff. When this RN and the NT went into pt's room to check his telemetry, the pt was sitting up in his bed fully dressed with his belongings in his hands.  He told us that he was leaving and proceeded to walk out of his room.  Pt had taken his tele box off and removed his IV.  Pt would not talk to [place] and put his fingers in his ears when this RN was trying to find out what he was upset about. Agricultural consultant, [employer] and [employer] notified of pt's erratic behavior.  This RN called the pt's son Dimitry Holsworth and was unable to reach him (left vm).  Pt continued to be upset and intent on leaving the hospital tonight.  Pt did not have transportation arranged.  Charge RN was able to persuade pt to go back to his room (pt moved to Room 1435 so that he could be close to nurses' station).  Notified NP, M. Donnal Debar (on call) and received orders for one time dose of Ativan 1mg  PO, d/c telemetry.  Gave the pt this med along with scheduled Trazodone along with a breathing tx.  Pt continued to insist on leaving the hospital.  Pt was overheard calling taxi service on his phone.  Night AC came up to the pt's room and spoke with pt attempting to talk pt into staying at the hospital for the night.  Pt agreed to this.  Will continue to monitor pt.

## 2013-11-06 ENCOUNTER — Inpatient Hospital Stay (HOSPITAL_COMMUNITY): Payer: Medicaid Other

## 2013-11-06 ENCOUNTER — Encounter (HOSPITAL_COMMUNITY): Payer: Self-pay

## 2013-11-06 DIAGNOSIS — E0590 Thyrotoxicosis, unspecified without thyrotoxic crisis or storm: Secondary | ICD-10-CM

## 2013-11-06 LAB — CBC
HCT: 34.6 % — ABNORMAL LOW (ref 39.0–52.0)
HEMOGLOBIN: 12 g/dL — AB (ref 13.0–17.0)
MCH: 29.6 pg (ref 26.0–34.0)
MCHC: 34.7 g/dL (ref 30.0–36.0)
MCV: 85.2 fL (ref 78.0–100.0)
Platelets: 264 10*3/uL (ref 150–400)
RBC: 4.06 MIL/uL — ABNORMAL LOW (ref 4.22–5.81)
RDW: 15.8 % — ABNORMAL HIGH (ref 11.5–15.5)
WBC: 16.5 10*3/uL — AB (ref 4.0–10.5)

## 2013-11-06 LAB — COMPREHENSIVE METABOLIC PANEL
ALT: 26 U/L (ref 0–53)
AST: 14 U/L (ref 0–37)
Albumin: 2.9 g/dL — ABNORMAL LOW (ref 3.5–5.2)
Alkaline Phosphatase: 32 U/L — ABNORMAL LOW (ref 39–117)
BUN: 31 mg/dL — AB (ref 6–23)
CALCIUM: 8.4 mg/dL (ref 8.4–10.5)
CO2: 25 mEq/L (ref 19–32)
CREATININE: 0.86 mg/dL (ref 0.50–1.35)
Chloride: 92 mEq/L — ABNORMAL LOW (ref 96–112)
GFR calc non Af Amer: >90 mL/min (ref >90)
GLUCOSE: 114 mg/dL — AB (ref 70–99)
Potassium: 4.4 mEq/L (ref 3.7–5.3)
SODIUM: 128 meq/L — AB (ref 137–147)
TOTAL PROTEIN: 5.3 g/dL — AB (ref 6.0–8.3)
Total Bilirubin: 0.3 mg/dL (ref 0.3–1.2)

## 2013-11-06 LAB — TROPONIN I

## 2013-11-06 LAB — T4, FREE: Free T4: 1.37 ng/dL (ref 0.80–1.80)

## 2013-11-06 LAB — T3, FREE: T3, Free: 2.4 pg/mL (ref 2.3–4.2)

## 2013-11-06 MED ORDER — ASPIRIN 81 MG PO CHEW
81.0000 mg | CHEWABLE_TABLET | ORAL | Status: DC
  Filled 2013-11-06: qty 1

## 2013-11-06 MED ORDER — SODIUM CHLORIDE 0.9 % IJ SOLN: 3.0000 mL | Freq: Two times a day (BID) | INTRAMUSCULAR | Status: DC

## 2013-11-06 MED ORDER — PREDNISONE 10 MG PO TABS
10.0000 mg | ORAL_TABLET | Freq: Every day | ORAL | Status: DC
  Administered 2013-11-07 – 2013-11-08 (×2): 10 mg via ORAL
  Filled 2013-11-06 (×3): qty 1

## 2013-11-06 MED ORDER — SODIUM CHLORIDE 0.9 % IV SOLN: 250.0000 mL | INTRAVENOUS | Status: DC | PRN

## 2013-11-06 MED ORDER — SODIUM CHLORIDE 0.9 % IV SOLN
INTRAVENOUS | Status: DC
  Administered 2013-11-06: 16:00:00 via INTRAVENOUS

## 2013-11-06 MED ORDER — SODIUM CHLORIDE 0.9 % IJ SOLN: 3.0000 mL | INTRAMUSCULAR | Status: DC | PRN

## 2013-11-06 MED ORDER — ENSURE PUDDING PO PUDG
1.0000 | Freq: Three times a day (TID) | ORAL | Status: DC
  Administered 2013-11-06 – 2013-11-08 (×4): 1 via ORAL
  Filled 2013-11-06 (×2): qty 1

## 2013-11-06 MED ORDER — SODIUM CHLORIDE 0.9 % IV SOLN
1.0000 mL/kg/h | INTRAVENOUS | Status: DC
  Administered 2013-11-07: 1 mL/kg/h via INTRAVENOUS

## 2013-11-06 MED ORDER — GADOBENATE DIMEGLUMINE 529 MG/ML IV SOLN
10.0000 mL | Freq: Once | INTRAVENOUS | Status: AC | PRN
  Administered 2013-11-06: 9 mL via INTRAVENOUS

## 2013-11-06 NOTE — Progress Notes (Signed)
Patient agitated and unwilling to wear his tele box, Dr. Terrence Dupont notified.  Will continue to monitor. [place], Ardeth Sportsman

## 2013-11-06 NOTE — Progress Notes (Signed)
Subjective:  Patient denies any chest pain states breathing is improved  Objective:  Vital Signs in the last 24 hours: Temp:  [98.4 F (36.9 C)-98.6 F (37 C)] 98.6 F (37 C) (02/09 1356) Pulse Rate:  [88-109] 96 (02/09 1356) Resp:  [12-22] 16 (02/09 1356) BP: (83-114)/(46-84) 94/72 mmHg (02/09 1356) SpO2:  [94 %-98 %] 96 % (02/09 1356) Weight:  [48.7 kg (107 lb 5.8 oz)] 48.7 kg (107 lb 5.8 oz) (02/09 0545)  Intake/Output from previous day: 02/08 0701 - 02/09 0700 In: 1510 [P.O.:1510] Out: 1075 [Urine:1075] Intake/Output from this shift:    Physical Exam: Neck: no adenopathy, no carotid bruit, no JVD and supple, symmetrical, trachea midline Lungs: Decreased breath sound at bases  Heart: Regular rate and rhythm S1 and S2 soft Abdomen: soft, non-tender; bowel sounds normal; no masses,  no organomegaly Extremities: extremities normal, atraumatic, no cyanosis or edema  Lab Results:  Recent Labs  11/05/13 1435 11/06/13 0417  WBC 15.9* 16.5*  HGB 13.4 12.0*  PLT 313 264    Recent Labs  11/05/13 1435 11/06/13 0417  NA 124* 128*  K 4.0 4.4  CL 95* 92*  CO2 27 25  GLUCOSE 144* 114*  BUN 22 31*  CREATININE 0.76 0.86    Recent Labs  11/05/13 1435 11/06/13 0417  TROPONINI <0.30 <0.30   Hepatic Function Panel  Recent Labs  11/06/13 0417  PROT 5.3*  ALBUMIN 2.9*  AST 14  ALT 26  ALKPHOS 32*  BILITOT 0.3   No results found for this basename: CHOL,  in the last 72 hours No results found for this basename: PROTIME,  in the last 72 hours  Imaging: Imaging results have been reviewed and Dg Eye Foreign Body  11/06/2013   CLINICAL DATA:  Metal working/exposure; clearance prior to MRI  EXAM: ORBITS FOR FOREIGN BODY - 2 VIEW  COMPARISON:  None.  FINDINGS: Water's views with eyes deviated toward the left and toward the right were obtained. There is no intraorbital radiopaque foreign body. No fracture or dislocation. Paranasal sinuses are clear.  IMPRESSION: No  evidence of metallic foreign body within the orbits.   Electronically Signed   By: Lowella Grip M.D.   On: 11/06/2013 10:48   Mr Abdomen W Wo Contrast  11/06/2013   CLINICAL DATA:  Left adrenal mass.  EXAM: MRI ABDOMEN WITHOUT AND WITH CONTRAST  TECHNIQUE: Multiplanar multisequence MR imaging of the abdomen was performed both before and after the administration of intravenous contrast.  CONTRAST:  16mL MULTIHANCE GADOBENATE DIMEGLUMINE 529 MG/ML IV SOLN  COMPARISON:  Chest CT 11/04/2013.  FINDINGS: 3.7 x 3.7 x 3.3 cm left adrenal mass again noted. This mass is heterogeneous in signal intensity on pre gadolinium T1 and T2 weighted images. However, there is a generalized loss of signal intensity throughout the lesion on out of phase dual echo images, indicative of significant intracellular lipid. Post-contrast images do demonstrate heterogeneous enhancement within the lesion, as well as posterior areas within the lesion which demonstrate little to no enhancement (suggesting internal areas of necrosis).  The visualized portions of the liver, gallbladder, pancreas, spleen, right adrenal gland and right kidney are unremarkable. Several small lesions in the left kidney are noted. The majority of these are low signal intensity on T1 weighted images, high signal intensity on T2 weighted images, and do not enhance, compatible with simple cysts. In addition, in the upper pole of the left kidney medially there is a sub cm lesion that is high signal intensity  on T1 weighted images, low signal intensity on T2 weighted images, and does not enhance, compatible with a tiny proteinaceous or hemorrhagic cyst. Atherosclerotic plaque in the abdominal aorta.  IMPRESSION: 1. The left adrenal mass has imaging characteristics most compatible with a large partially necrotic adrenal adenoma, as detailed above. 2. Small simple cysts and small hemorrhagic or proteinaceous cyst in the left kidney incidentally noted. 3. Atherosclerosis.    Electronically Signed   By: Vinnie Langton M.D.   On: 11/06/2013 13:49    Cardiac Studies:  Assessment/Plan:  Resolving exacerbation of COPD  Probable recent anteroseptal wall MI  Probable ischemic cardiomyopathy EF 20-25% Decompensated systolic heart failure  History of tobacco abuse and alcohol abuse  Adrenal mass Hyponatremia Plan Discussed with patient regarding left cardiac cath possible PTCA stenting its risk and benefits i.e. Death MI stroke need for emergency CABG risk of restenosis local vascular complications etc. And consents for PCI  LOS: 3 days    Katleen Carraway N 11/06/2013, 3:42 PM

## 2013-11-06 NOTE — Progress Notes (Addendum)
Note: This document was prepared with digital dictation and possible smart phrase technology. Any transcriptional errors that result from this process are unintentional.   Anthony Bolton IWL:[id number] DOB: 1951/09/08 DOA: 11/03/2013 PCP: [name], MD  Brief narrative: 63 y/o ?, known GOLD COPD undifferentiated, Hyponatremia as low as 117 on 10/31/13-recently admitted and d/c, Impaired glucose tolerance who re-presented 11/04/13 with worsening SOB in a setting of completing Prednisone, found once again to have hyponatremia of 127 He smokes ~2 ppd since age 74 Has lost 20-30 lbs since teeth extraction 8/14. His appetite however is good.He has no dentures U osm= 200, U sod=26 During his workup for shortness of breath as well as hyponatremia he had an echocardiogram which showed an EF of 20% and cardiology was consulted A CT scan performed of his chest because of concerns in a chronic heavy smoker for lung cancer, showed a 3.7 x 3.7 x 3.0 partially necrotic adrenal mass on the left side  Past medical history-As per Problem list Chart reviewed as below-  Consultants:  Cardiology   Telephone consulted urology  Procedures:  NOne   Antibiotics:  None   Subjective   Doing fair. Tolerating diet Eating and drinking He has been careful than before however does get somewhat agitated per nursing   Objective    Interim History: None  Telemetry: None   Objective: Filed Vitals:   11/05/13 2045 11/06/13 0545 11/06/13 0945 11/06/13 1356  BP: 104/75 114/84 83/46 94/72   Pulse: 109 88 97 96  Temp: 98.4 F (36.9 C) 98.4 F (36.9 C)  98.6 F (37 C)  TempSrc: Oral Oral  Oral  Resp: 22 18 12 16   Height:      Weight:  48.7 kg (107 lb 5.8 oz)    SpO2: 94% 98% 98% 96%    Intake/Output Summary (Last 24 hours) at 11/06/13 1600 Last data filed at 11/06/13 0700  Gross per 24 hour  Intake    950 ml  Output   1075 ml  Net   -125 ml    Exam:  General: EOMI,  NCAT Cardiovascular: S1-S2 slightly tachycardic Respiratory: Clinically clear Abdomen: Soft nontender nondistended no rebound Skin no lower extremity edema Neuro grossly intact  Data Reviewed: Basic Metabolic Panel:  Recent Labs Lab 11/04/13 0034 11/04/13 0551 11/04/13 0957 11/05/13 1435 11/06/13 0417  NA 121* 127* 127* 124* 128*  K 4.9 4.7 4.4 4.0 4.4  CL 86* 92* 91* 95* 92*  CO2 24 26 27 27 25   GLUCOSE 150* 142* 153* 144* 114*  BUN 23 21 21 22  31*  CREATININE 0.62 0.66 0.65 0.76 0.86  CALCIUM 8.9 8.9 8.6 8.9 8.4   Liver Function Tests:  Recent Labs Lab 11/06/13 0417  AST 14  ALT 26  ALKPHOS 32*  BILITOT 0.3  PROT 5.3*  ALBUMIN 2.9*   No results found for this basename: LIPASE, AMYLASE,  in the last 168 hours No results found for this basename: AMMONIA,  in the last 168 hours CBC:  Recent Labs Lab 10/31/13 0450 11/05/13 1435 11/06/13 0417  WBC 12.2* 15.9* 16.5*  NEUTROABS 8.5* 14.5*  --   HGB 11.3* 13.4 12.0*  HCT 30.9* 38.1* 34.6*  MCV 81.1 83.9 85.2  PLT 294 313 264   Cardiac Enzymes:  Recent Labs Lab 11/05/13 1435 11/06/13 0417  TROPONINI <0.30 <0.30   BNP: No components found with this basename: POCBNP,  CBG: No results found for this basename: GLUCAP,  in the last 168 hours  No results found for this or any previous visit (from the past 240 hour(s)).   Studies:              All Imaging reviewed and is as per above notation   Scheduled Meds: . aspirin EC  81 mg Oral Daily  . atorvastatin  80 mg Oral q1800  . clopidogrel  75 mg Oral Q breakfast  . digoxin  0.125 mg Oral Daily  . heparin  5,000 Units Subcutaneous Q8H  . nicotine  21 mg Transdermal Daily  . predniSONE  20 mg Oral Daily  . sodium chloride  3 mL Intravenous Q12H  . traZODone  50 mg Oral QHS   Continuous Infusions: . sodium chloride 50 mL/hr at 11/06/13 1547     Assessment/Plan:   1. Euvolemic hyponatremia-SIADH +/- excess fluid>solid intake vs. CHF .   Echocardiogram 2/8 = severe hypokinesis, He will need fluid restriction and is aware of this.  He has been restarted on slow IV fluids 50 cc per hour normal saline 2. Cardiomyopathy, severe-unclear etiology potential he could have been related to chest pain a couple of weeks ago-EF 20%. Appreciate cardiology input-pound cardiac catheterization 11/07/13 a.m.-no overt same symptoms of CHF at present-continue Plavix 75 mg daily, aspirin 81 mg daily 3. Hypotension, iatrogenic-started 2/8 on Coreg, ramipril, digoxin-these had to be held as his blood pressure dropped into the high 34L systolic 4. 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  Will need outpatient controller medications and more importantly will need to quit smoking.  Wean steroids quickly given behavioral disturbances 5. Leukocytosis-related to steroid use 6. Mild acute kidney injury-restarted the low dose IV saline 50 cc per hour-reassess in a.m. with labs 7. Possible bipolar-continue trazodone 50 mg each bedtime 8. Hyperthyroidism-TSH 0.085-T4 = 1.37, T3 free = 2.4--DDx likely subclinical hypothyroidism however as patient has lost weight I feel it is prudent to get workup with radioactive iodine once cardiac cath is performed-thyrotropin receptor autoantibody ordered 9. Body mass index is 15.85 kg/(m^2).-severe malnutrition -ill need outpatient dentist to reassess him. I suspect his weight loss may be secondary to not able to eat enough-nutrition recommends Ensure pudding 3 times a day liberalize diet 10. Left adrenal adenoma, partially necrotic-MRI shows necrotic left pancreatic adenoma-discussed with Dr. Junious Silk of urology 11/06/13 who recommends close outpatient followup mainly secondary to size. This in and of itself should not need surgery unless it becomes symptomatic in terms of pain or pressure effect 11. Chronic long-term tobacco abuse-nicotine patch and gum  ordered  Code Status: Full  Family Communication: None at bedside  Disposition Plan: Patient to be transferred to [facility] at [facility] and cardiology to follow and cath patient in a.m.   [name], MD  [facility] Pager [phone] 11/06/2013, 4:00 PM    LOS: 3 days

## 2013-11-06 NOTE — Progress Notes (Signed)
INITIAL NUTRITION ASSESSMENT  DOCUMENTATION CODES Per approved criteria  -Severe malnutrition in the context of chronic illness -Underweight  Pt meets criteria for severe MALNUTRITION in the context of acute injury (teeth extraction) as evidenced by 21% weight loss in 6 month period,  And evident loss of subcutaneous fat and muscle wasting in temporal, clavicle, upper arm, posterior/anterior calf and thigh regions.   INTERVENTION: -Recommend Ensure Pudding po TID, each supplement provides 170 kcal and 4 grams of protein -Continue liberalized diet w/fluid restriction -Encourage PO intake -Will continue to monitor  NUTRITION DIAGNOSIS: Unintentional wt loss related to difficulty chewing/swallowing as evidenced by 30 lbs weight loss in 6 months  Goal: Pt to meet >/= 90% of their estimated nutrition needs    Monitor:  Total protein/energy intake, weight trend, swallow profile, labs, fluid status  Reason for Assessment: MST/Underweight BMI  63 y.o. male  Admitting Dx: Hyponatremia  ASSESSMENT: Anthony Bolton is a 63 y.o. male h/o COPD presents to ED with SOB. This is the second time this week with identical presentation. Patient was evaluated in ED and once again, was not found to have an overly severe COPD exacerbation (no wheezing, satting 99% on room air); however, just like 3 days ago, he was found to have hyponatremia with sodium of 120 (was 128 on discharge on 2/3). Once again will re-admit for treatment of hyponatremia  -Pt reported unintentional wt loss d/t difficulty chewing and swallowing foods. Usual weight was 140 lbs, has lost approximately 30 lbs in 6 months since teeth extraction in 04/2013 -Has been eating well during admit, > 75% of meals. Denied any difficulty with current diet textures. Pt chops meats to tolerate.  -Encouraged pt to consume supplements/soft proteins to assist in nutrient replenishment. Was difficult to obtain full diet recall as pt was slightly  confused and hard of hearing, but pt did confirm a decrease of PO intake since August has occurred d/t inability to chew/swallow some foods -Currently on fluid restriction of 1200 ml. Was willing to try Ensure pudding supplement w/meals. Will monitor sodium levels -Pt with evident loss of subcutaneous fat and muscle wasting in extremities, clavicle and facial regions. Eager to regain weight/muscle mass.   Height: Ht Readings from Last 1 Encounters:  11/04/13 5\' 9"  (1.753 m)    Weight: Wt Readings from Last 1 Encounters:  11/06/13 107 lb 5.8 oz (48.7 kg)    Ideal Body Weight: 160 lbs  % Ideal Body Weight: 67%  Wt Readings from Last 10 Encounters:  11/06/13 107 lb 5.8 oz (48.7 kg)  11/01/13 130 lb (58.968 kg)  10/29/13 130 lb (58.968 kg)    Usual Body Weight: 140 lbs  % Usual Body Weight: 76%  BMI:  Body mass index is 15.85 kg/(m^2). Underweight  Estimated Nutritional Needs: Kcal: 1500-1700 kcal Protein: 60-70 gram Fluid: 1200 ml/daily per MD  Skin: WDL  Diet Order: General  EDUCATION NEEDS: -No education needs identified at this time   Intake/Output Summary (Last 24 hours) at 11/06/13 1625 Last data filed at 11/06/13 0700  Gross per 24 hour  Intake    950 ml  Output   1075 ml  Net   -125 ml    Last BM: 2/07   Labs:   Recent Labs Lab 11/04/13 0957 11/05/13 1435 11/06/13 0417  NA 127* 124* 128*  K 4.4 4.0 4.4  CL 91* 95* 92*  CO2 27 27 25   BUN 21 22 31*  CREATININE 0.65 0.76 0.86  CALCIUM 8.6  8.9 8.4  GLUCOSE 153* 144* 114*    CBG (last 3)  No results found for this basename: GLUCAP,  in the last 72 hours  Scheduled Meds: . [START ON 11/07/2013] aspirin  81 mg Oral Pre-Cath  . aspirin EC  81 mg Oral Daily  . atorvastatin  80 mg Oral q1800  . clopidogrel  75 mg Oral Q breakfast  . digoxin  0.125 mg Oral Daily  . feeding supplement (ENSURE)  1 Container Oral TID WC  . heparin  5,000 Units Subcutaneous Q8H  . nicotine  21 mg Transdermal  Daily  . predniSONE  20 mg Oral Daily  . sodium chloride  3 mL Intravenous Q12H  . sodium chloride  3 mL Intravenous Q12H  . traZODone  50 mg Oral QHS    Continuous Infusions: . sodium chloride 50 mL/hr at 11/06/13 1547  . [START ON 11/07/2013] sodium chloride      Past Medical History  Diagnosis Date  . COPD (chronic obstructive pulmonary disease)     Past Surgical History  Procedure Laterality Date  . Appendectomy      [place] Clinical Dietitian [phone]

## 2013-11-07 ENCOUNTER — Encounter (HOSPITAL_COMMUNITY)
Admission: EM | Disposition: A | Discharge status: Home / Self Care | Payer: Self-pay | Source: Home / Self Care | Attending: Family Medicine

## 2013-11-07 ENCOUNTER — Inpatient Hospital Stay (HOSPITAL_COMMUNITY): Payer: Medicaid Other

## 2013-11-07 DIAGNOSIS — E43 Unspecified severe protein-calorie malnutrition: Secondary | ICD-10-CM | POA: Insufficient documentation

## 2013-11-07 DIAGNOSIS — R0989 Other specified symptoms and signs involving the circulatory and respiratory systems: Secondary | ICD-10-CM

## 2013-11-07 DIAGNOSIS — E0590 Thyrotoxicosis, unspecified without thyrotoxic crisis or storm: Secondary | ICD-10-CM

## 2013-11-07 DIAGNOSIS — R0609 Other forms of dyspnea: Secondary | ICD-10-CM

## 2013-11-07 HISTORY — PX: LEFT HEART CATHETERIZATION WITH CORONARY ANGIOGRAM: SHX5451

## 2013-11-07 LAB — BASIC METABOLIC PANEL
BUN: 24 mg/dL — ABNORMAL HIGH (ref 6–23)
CALCIUM: 8.6 mg/dL (ref 8.4–10.5)
CO2: 25 meq/L (ref 19–32)
Chloride: 96 mEq/L (ref 96–112)
Creatinine, Ser: 0.78 mg/dL (ref 0.50–1.35)
GFR calc Af Amer: >90 mL/min (ref >90)
Glucose, Bld: 96 mg/dL (ref 70–99)
Potassium: 4.6 mEq/L (ref 3.7–5.3)
SODIUM: 134 meq/L — AB (ref 137–147)

## 2013-11-07 LAB — CBC
HCT: 36.7 % — ABNORMAL LOW (ref 39.0–52.0)
Hemoglobin: 12.7 g/dL — ABNORMAL LOW (ref 13.0–17.0)
MCH: 29.3 pg (ref 26.0–34.0)
MCHC: 34.6 g/dL (ref 30.0–36.0)
MCV: 84.8 fL (ref 78.0–100.0)
Platelets: 302 10*3/uL (ref 150–400)
RBC: 4.33 MIL/uL (ref 4.22–5.81)
RDW: 15.7 % — ABNORMAL HIGH (ref 11.5–15.5)
WBC: 13.9 10*3/uL — ABNORMAL HIGH (ref 4.0–10.5)

## 2013-11-07 LAB — PROTIME-INR
INR: 0.97 (ref 0.00–1.49)
Prothrombin Time: 12.7 seconds (ref 11.6–15.2)

## 2013-11-07 LAB — AMMONIA: Ammonia: 29 umol/L (ref 11–60)

## 2013-11-07 SURGERY — LEFT HEART CATHETERIZATION WITH CORONARY ANGIOGRAM
Anesthesia: LOCAL

## 2013-11-07 MED ORDER — NITROGLYCERIN 0.2 MG/ML ON CALL CATH LAB
INTRAVENOUS | Status: AC
  Filled 2013-11-07: qty 1

## 2013-11-07 MED ORDER — RAMIPRIL 1.25 MG PO CAPS
1.2500 mg | ORAL_CAPSULE | Freq: Every day | ORAL | Status: DC
  Administered 2013-11-07 – 2013-11-08 (×2): 1.25 mg via ORAL
  Filled 2013-11-07 (×2): qty 1

## 2013-11-07 MED ORDER — THIAMINE HCL 100 MG/ML IJ SOLN
100.0000 mg | Freq: Every day | INTRAMUSCULAR | Status: DC
Start: 2013-11-07 — End: 2013-11-08
  Administered 2013-11-07 – 2013-11-08 (×2): 100 mg via INTRAVENOUS
  Filled 2013-11-07 (×2): qty 1

## 2013-11-07 MED ORDER — SODIUM CHLORIDE 0.9 % IV SOLN: 1.0000 mg | Freq: Once | INTRAVENOUS | Status: DC

## 2013-11-07 MED ORDER — HEPARIN (PORCINE) IN NACL 2-0.9 UNIT/ML-% IJ SOLN
INTRAMUSCULAR | Status: AC
  Filled 2013-11-07: qty 1000

## 2013-11-07 MED ORDER — SODIUM CHLORIDE 0.9 % IV SOLN: INTRAVENOUS | Status: DC

## 2013-11-07 MED ORDER — DIAZEPAM 5 MG PO TABS: 5.0000 mg | ORAL_TABLET | Freq: Once | ORAL | Status: DC

## 2013-11-07 MED ORDER — SODIUM CHLORIDE 0.9 % IV SOLN
INTRAVENOUS | Status: DC
  Administered 2013-11-07: 14:00:00 via INTRAVENOUS

## 2013-11-07 MED ORDER — LORAZEPAM 2 MG/ML IJ SOLN
INTRAMUSCULAR | Status: AC
Start: 2013-11-07 — End: 2013-11-07
  Filled 2013-11-07: qty 1

## 2013-11-07 MED ORDER — LORAZEPAM 2 MG/ML IJ SOLN: 0.5000 mg | Freq: Once | INTRAMUSCULAR | Status: AC | PRN

## 2013-11-07 MED ORDER — LIDOCAINE HCL (PF) 1 % IJ SOLN
INTRAMUSCULAR | Status: AC
  Filled 2013-11-07: qty 30

## 2013-11-07 MED ORDER — FOLIC ACID 5 MG/ML IJ SOLN
1.0000 mg | Freq: Once | INTRAMUSCULAR | Status: AC
Start: 2013-11-07 — End: 2013-11-07
  Administered 2013-11-07: 1 mg via INTRAVENOUS
  Filled 2013-11-07: qty 0.2

## 2013-11-07 MED ORDER — LORAZEPAM 2 MG/ML IJ SOLN
1.0000 mg | Freq: Once | INTRAMUSCULAR | Status: AC
  Administered 2013-11-07: 1 mg via INTRAVENOUS

## 2013-11-07 MED ORDER — ONDANSETRON HCL 4 MG/2ML IJ SOLN: 4.0000 mg | Freq: Four times a day (QID) | INTRAMUSCULAR | Status: DC | PRN

## 2013-11-07 MED ORDER — CARVEDILOL 3.125 MG PO TABS
3.1250 mg | ORAL_TABLET | Freq: Two times a day (BID) | ORAL | Status: DC
  Administered 2013-11-07 – 2013-11-08 (×2): 3.125 mg via ORAL
  Filled 2013-11-07 (×4): qty 1

## 2013-11-07 MED ORDER — ACETAMINOPHEN 325 MG PO TABS
650.0000 mg | ORAL_TABLET | ORAL | Status: DC | PRN
  Administered 2013-11-08: 650 mg via ORAL
  Filled 2013-11-07: qty 2

## 2013-11-07 NOTE — Progress Notes (Signed)
Subjective:  Patient presently drowsy sleepy but easily arousable. Denies any chest pain or shortness of breath  Objective:  Vital Signs in the last 24 hours: Temp:  [97.6 F (36.4 C)-98.5 F (36.9 C)] 98 F (36.7 C) (02/10 1115) Pulse Rate:  [75-99] 99 (02/10 1345) Resp:  [14-18] 14 (02/10 1115) BP: (104-130)/(66-94) 128/83 mmHg (02/10 1358) SpO2:  [95 %-99 %] 99 % (02/10 1115)  Intake/Output from previous day:   Intake/Output from this shift: Total I/O In: 360 [P.O.:360] Out: -   Physical Exam: Neck: no adenopathy, no carotid bruit, no JVD and supple, symmetrical, trachea midline Lungs: Decreased breath sound at bases Heart: regular rate and rhythm and S1 and S2 soft Abdomen: soft, non-tender; bowel sounds normal; no masses,  no organomegaly Extremities: extremities normal, atraumatic, no cyanosis or edema and Right groin stable  Lab Results:  Recent Labs  11/06/13 0417 11/07/13 0500  WBC 16.5* 13.9*  HGB 12.0* 12.7*  PLT 264 302    Recent Labs  11/06/13 0417 11/07/13 0500  NA 128* 134*  K 4.4 4.6  CL 92* 96  CO2 25 25  GLUCOSE 114* 96  BUN 31* 24*  CREATININE 0.86 0.78    Recent Labs  11/05/13 1435 11/06/13 0417  TROPONINI <0.30 <0.30   Hepatic Function Panel  Recent Labs  11/06/13 0417  PROT 5.3*  ALBUMIN 2.9*  AST 14  ALT 26  ALKPHOS 32*  BILITOT 0.3   No results found for this basename: CHOL,  in the last 72 hours No results found for this basename: PROTIME,  in the last 72 hours  Imaging: Imaging results have been reviewed and Dg Eye Foreign Body  11/06/2013   CLINICAL DATA:  Metal working/exposure; clearance prior to MRI  EXAM: ORBITS FOR FOREIGN BODY - 2 VIEW  COMPARISON:  None.  FINDINGS: Water's views with eyes deviated toward the left and toward the right were obtained. There is no intraorbital radiopaque foreign body. No fracture or dislocation. Paranasal sinuses are clear.  IMPRESSION: No evidence of metallic foreign body within  the orbits.   Electronically Signed   By: Lowella Grip M.D.   On: 11/06/2013 10:48   Mr Abdomen W Wo Contrast  11/06/2013   CLINICAL DATA:  Left adrenal mass.  EXAM: MRI ABDOMEN WITHOUT AND WITH CONTRAST  TECHNIQUE: Multiplanar multisequence MR imaging of the abdomen was performed both before and after the administration of intravenous contrast.  CONTRAST:  58mL MULTIHANCE GADOBENATE DIMEGLUMINE 529 MG/ML IV SOLN  COMPARISON:  Chest CT 11/04/2013.  FINDINGS: 3.7 x 3.7 x 3.3 cm left adrenal mass again noted. This mass is heterogeneous in signal intensity on pre gadolinium T1 and T2 weighted images. However, there is a generalized loss of signal intensity throughout the lesion on out of phase dual echo images, indicative of significant intracellular lipid. Post-contrast images do demonstrate heterogeneous enhancement within the lesion, as well as posterior areas within the lesion which demonstrate little to no enhancement (suggesting internal areas of necrosis).  The visualized portions of the liver, gallbladder, pancreas, spleen, right adrenal gland and right kidney are unremarkable. Several small lesions in the left kidney are noted. The majority of these are low signal intensity on T1 weighted images, high signal intensity on T2 weighted images, and do not enhance, compatible with simple cysts. In addition, in the upper pole of the left kidney medially there is a sub cm lesion that is high signal intensity on T1 weighted images, low signal intensity on T2  weighted images, and does not enhance, compatible with a tiny proteinaceous or hemorrhagic cyst. Atherosclerotic plaque in the abdominal aorta.  IMPRESSION: 1. The left adrenal mass has imaging characteristics most compatible with a large partially necrotic adrenal adenoma, as detailed above. 2. Small simple cysts and small hemorrhagic or proteinaceous cyst in the left kidney incidentally noted. 3. Atherosclerosis.   Electronically Signed   By: Vinnie Langton M.D.   On: 11/06/2013 13:49    Cardiac Studies:  Assessment/Plan:  Nonischemic dilated cardiomyopathy Resolving systolic congestive heart failure Status post exacerbation of COPD History of alcohol abuse History of tobacco abuse Adrenal mass Status post hyponatremia etiology unclear Depressed TSH rule out hyperthyroidism Plan Increase ACE and beta blocker as blood pressure tolerates We'll repeat 2-D echo in few months if EF remains below 35% will need ICD  LOS: 4 days    Bayron Dalto N 11/07/2013, 5:16 PM

## 2013-11-07 NOTE — H&P (View-Only) (Signed)
Subjective:  Patient denies any chest pain states breathing is improved  Objective:  Vital Signs in the last 24 hours: Temp:  [98.4 F (36.9 C)-98.6 F (37 C)] 98.6 F (37 C) (02/09 1356) Pulse Rate:  [88-109] 96 (02/09 1356) Resp:  [12-22] 16 (02/09 1356) BP: (83-114)/(46-84) 94/72 mmHg (02/09 1356) SpO2:  [94 %-98 %] 96 % (02/09 1356) Weight:  [48.7 kg (107 lb 5.8 oz)] 48.7 kg (107 lb 5.8 oz) (02/09 0545)  Intake/Output from previous day: 02/08 0701 - 02/09 0700 In: 1510 [P.O.:1510] Out: 1075 [Urine:1075] Intake/Output from this shift:    Physical Exam: Neck: no adenopathy, no carotid bruit, no JVD and supple, symmetrical, trachea midline Lungs: Decreased breath sound at bases  Heart: Regular rate and rhythm S1 and S2 soft Abdomen: soft, non-tender; bowel sounds normal; no masses,  no organomegaly Extremities: extremities normal, atraumatic, no cyanosis or edema  Lab Results:  Recent Labs  11/05/13 1435 11/06/13 0417  WBC 15.9* 16.5*  HGB 13.4 12.0*  PLT 313 264    Recent Labs  11/05/13 1435 11/06/13 0417  NA 124* 128*  K 4.0 4.4  CL 95* 92*  CO2 27 25  GLUCOSE 144* 114*  BUN 22 31*  CREATININE 0.76 0.86    Recent Labs  11/05/13 1435 11/06/13 0417  TROPONINI <0.30 <0.30   Hepatic Function Panel  Recent Labs  11/06/13 0417  PROT 5.3*  ALBUMIN 2.9*  AST 14  ALT 26  ALKPHOS 32*  BILITOT 0.3   No results found for this basename: CHOL,  in the last 72 hours No results found for this basename: PROTIME,  in the last 72 hours  Imaging: Imaging results have been reviewed and Dg Eye Foreign Body  11/06/2013   CLINICAL DATA:  Metal working/exposure; clearance prior to MRI  EXAM: ORBITS FOR FOREIGN BODY - 2 VIEW  COMPARISON:  None.  FINDINGS: Water's views with eyes deviated toward the left and toward the right were obtained. There is no intraorbital radiopaque foreign body. No fracture or dislocation. Paranasal sinuses are clear.  IMPRESSION: No  evidence of metallic foreign body within the orbits.   Electronically Signed   By: William  Woodruff M.D.   On: 11/06/2013 10:48   Mr Abdomen W Wo Contrast  11/06/2013   CLINICAL DATA:  Left adrenal mass.  EXAM: MRI ABDOMEN WITHOUT AND WITH CONTRAST  TECHNIQUE: Multiplanar multisequence MR imaging of the abdomen was performed both before and after the administration of intravenous contrast.  CONTRAST:  9mL MULTIHANCE GADOBENATE DIMEGLUMINE 529 MG/ML IV SOLN  COMPARISON:  Chest CT 11/04/2013.  FINDINGS: 3.7 x 3.7 x 3.3 cm left adrenal mass again noted. This mass is heterogeneous in signal intensity on pre gadolinium T1 and T2 weighted images. However, there is a generalized loss of signal intensity throughout the lesion on out of phase dual echo images, indicative of significant intracellular lipid. Post-contrast images do demonstrate heterogeneous enhancement within the lesion, as well as posterior areas within the lesion which demonstrate little to no enhancement (suggesting internal areas of necrosis).  The visualized portions of the liver, gallbladder, pancreas, spleen, right adrenal gland and right kidney are unremarkable. Several small lesions in the left kidney are noted. The majority of these are low signal intensity on T1 weighted images, high signal intensity on T2 weighted images, and do not enhance, compatible with simple cysts. In addition, in the upper pole of the left kidney medially there is a sub cm lesion that is high signal intensity   on T1 weighted images, low signal intensity on T2 weighted images, and does not enhance, compatible with a tiny proteinaceous or hemorrhagic cyst. Atherosclerotic plaque in the abdominal aorta.  IMPRESSION: 1. The left adrenal mass has imaging characteristics most compatible with a large partially necrotic adrenal adenoma, as detailed above. 2. Small simple cysts and small hemorrhagic or proteinaceous cyst in the left kidney incidentally noted. 3. Atherosclerosis.    Electronically Signed   By: Vinnie Langton M.D.   On: 11/06/2013 13:49    Cardiac Studies:  Assessment/Plan:  Resolving exacerbation of COPD  Probable recent anteroseptal wall MI  Probable ischemic cardiomyopathy EF 20-25% Decompensated systolic heart failure  History of tobacco abuse and alcohol abuse  Adrenal mass Hyponatremia Plan Discussed with patient regarding left cardiac cath possible PTCA stenting its risk and benefits i.e. Death MI stroke need for emergency CABG risk of restenosis local vascular complications etc. And consents for PCI  LOS: 3 days    Trigo Winterbottom N 11/06/2013, 3:42 PM

## 2013-11-07 NOTE — Care Management Note (Signed)
    Page 1 of 2   11/08/2013     2:22:41 PM   CARE MANAGEMENT NOTE 11/08/2013  Patient:  Anthony Bolton, Anthony Bolton   Account Number:  [id number]  Date Initiated:  11/07/2013  Documentation initiated by:  GRAVES-BIGELOW,Ardie Mclennan  Subjective/Objective Assessment:   Pt was a transfer from [facility] for Cardiac cath. Hx of COPD, hyponatremia & and COPD exacerbation. Per MD notes Ef 20%; started on coreg and ramipril. Cath results pending     Action/Plan:   Partnership for [facility] did f/ with pt while in [place] may benefit from [facility] RN due to several admissions listed. Will continue to monitor.   Anticipated DC Date:  11/09/2013   Anticipated DC Plan:  [place]  CM consult      [facility] Choice  DURABLE MEDICAL EQUIPMENT   Choice offered to / List presented to:  C-1 Patient   DME arranged  Vassie Moselle      DME agency  [place].        Status of service:  Completed, signed off Medicare Important Message given?   (If response is "NO", the following Medicare IM given date fields will be blank) Date Medicare IM given:   Date Additional Medicare IM given:    Discharge Disposition:  HOME/SELF CARE  Per UR Regulation:  Reviewed for med. necessity/level of care/duration of stay  If discussed at [place] of Stay Meetings, dates discussed:   11/09/2013    Comments:  11-08-13 [address], RN,BSN [phone] DME RW will be delivered to room before d/c.    11-08-13 1242 Jacqlyn Krauss, RN,BSN [phone] Post cardiac cath- per cardiology standpoint pt is medically stable for d/c. Pt refusing PT to consult. Unsure if will d/c today. Will continue to monitor for disposition needs.

## 2013-11-07 NOTE — Progress Notes (Signed)
Pt disconnected his IV and when I attempted to reconnect he yelled NO and refused medication. Arthor Captain LPN

## 2013-11-07 NOTE — Interval H&P Note (Signed)
Cath Lab Visit (complete for each Cath Lab visit)  Clinical Evaluation Leading to the Procedure:   ACS: no  Non-ACS:    Anginal Classification: CCS IV  Anti-ischemic medical therapy: Minimal Therapy (1 class of medications)  Non-Invasive Test Results: No non-invasive testing performed  Prior CABG: No previous CABG      History and Physical Interval Note:  11/07/2013 11:43 AM  Anthony Bolton  has presented today for surgery, with the diagnosis of cp  The various methods of treatment have been discussed with the patient and family. After consideration of risks, benefits and other options for treatment, the patient has consented to  Procedure(s): LEFT HEART CATHETERIZATION WITH CORONARY ANGIOGRAM (N/A) as a surgical intervention .  The patient's history has been reviewed, patient examined, no change in status, stable for surgery.  I have reviewed the patient's chart and labs.  Questions were answered to the patient's satisfaction.     Clent Demark

## 2013-11-07 NOTE — CV Procedure (Signed)
Left cardiac cath report dictated on 11/07/2013 dictation number is [phone]

## 2013-11-07 NOTE — Progress Notes (Signed)
[facility] PROGRESS NOTE  Anthony Bolton MWU:[id number] DOB: [birth year] DOA: 11/03/2013 PCP: [name], MD  Assessment/Plan: 63 year old with PMH significant for COPD, hyponatremia, recently discharge from [facility] 2-04 after treatment of hyponatremia and COPD exacerbation who presents 2-07 with worsening dyspnea, found to have sodium level at 121 on admission. During his work up for dyspnea an ECHO cardiogram was done which showed an EF of 20 %. Cardiology was consulted. Patient underwent Cardiac cath 2-10. Results pending.   1-Hyponatremia: probably related to decrease volume. Sodium level improved with IV fluids. This make SIADH unlikely. No evidence of volume overload unlikely CHF. Sodium has increase to 134. Will probably need NSL fluids to avoid pulmonary edema.   2-New Cardiomyopathy; Ef 20%; started on coreg and ramipril. Cath results pending.  3-Hypotension; thought to be secondary to BP medications. SBP increase. Monitor.  4-COPD; nebulizer treatment. Continue with prednisone.  5-Leukocytosis; secondary to steroids. Trending down.  6-Bipolar; trazodone.  7-Decrease TSH: TSH 0.085- Normal T4 = 1.37, T3 free = 2.4-- In setting of acute illness. He needs repeat TSH in 3-4 weeks.  Dr Verlon Au order Thyrotropin receptor.  8-Severe malnutrition; ensure TID.  9-Left adrenal adenoma, partially necrotic; per MRI. Dr Verlon Au discussed case with urology. who recommends close outpatient followup mainly secondary to size. This in and of itself should not need surgery unless it becomes symptomatic in terms of pain or pressure effect 10-Confusion: Patient has been confused. He is agitated post Cardiac cath. Will order 1 mg IV ativan. Close monitoring. Check Ammonia level.   Code Status: Full Code.  Family Communication: None at bedside.  Disposition Plan: To be determine.    Consultants:  Cardiology.   Procedures:  Cath; results pending.    Antibiotics:  none  HPI/Subjective: Denies chest pain, confuse. He said leave alone.   Objective: Filed Vitals:   11/07/13 1200  BP:   Pulse: 77  Temp:   Resp:    No intake or output data in the 24 hours ending 11/07/13 1350 Filed Weights   11/04/13 0427 11/06/13 0545  Weight: 50.3 kg (110 lb 14.3 oz) 48.7 kg (107 lb 5.8 oz)    Exam:   General:  No distress.   Cardiovascular: S 1, S 2 RRR  Respiratory: Crackles bases.   Abdomen: Bs present. Soft.  Musculoskeletal: no edema.   Data Reviewed: Basic Metabolic Panel:  Recent Labs Lab 11/04/13 0551 11/04/13 0957 11/05/13 1435 11/06/13 0417 11/07/13 0500  NA 127* 127* 124* 128* 134*  K 4.7 4.4 4.0 4.4 4.6  CL 92* 91* 95* 92* 96  CO2 26 27 27 25 25   GLUCOSE 142* 153* 144* 114* 96  BUN 21 21 22  31* 24*  CREATININE 0.66 0.65 0.76 0.86 0.78  CALCIUM 8.9 8.6 8.9 8.4 8.6   Liver Function Tests:  Recent Labs Lab 11/06/13 0417  AST 14  ALT 26  ALKPHOS 32*  BILITOT 0.3  PROT 5.3*  ALBUMIN 2.9*   CBC:  Recent Labs Lab 11/05/13 1435 11/06/13 0417 11/07/13 0500  WBC 15.9* 16.5* 13.9*  NEUTROABS 14.5*  --   --   HGB 13.4 12.0* 12.7*  HCT 38.1* 34.6* 36.7*  MCV 83.9 85.2 84.8  PLT 313 264 302   Cardiac Enzymes:  Recent Labs Lab 11/05/13 1435 11/06/13 0417  TROPONINI <0.30 <0.30   BNP (last 3 results)  Recent Labs  10/25/13 1320 10/30/13 0040 11/05/13 1435  PROBNP 193.6* 147.2* 24700.0*   CBG: No results found for this  basename: GLUCAP,  in the last 168 hours  No results found for this or any previous visit (from the past 240 hour(s)).   Studies: Dg Eye Foreign Body  11/06/2013   CLINICAL DATA:  Metal working/exposure; clearance prior to MRI  EXAM: ORBITS FOR FOREIGN BODY - 2 VIEW  COMPARISON:  None.  FINDINGS: Water's views with eyes deviated toward the left and toward the right were obtained. There is no intraorbital radiopaque foreign body. No fracture or dislocation. Paranasal  sinuses are clear.  IMPRESSION: No evidence of metallic foreign body within the orbits.   Electronically Signed   By: Lowella Grip M.D.   On: 11/06/2013 10:48   Mr Abdomen W Wo Contrast  11/06/2013   CLINICAL DATA:  Left adrenal mass.  EXAM: MRI ABDOMEN WITHOUT AND WITH CONTRAST  TECHNIQUE: Multiplanar multisequence MR imaging of the abdomen was performed both before and after the administration of intravenous contrast.  CONTRAST:  2mL MULTIHANCE GADOBENATE DIMEGLUMINE 529 MG/ML IV SOLN  COMPARISON:  Chest CT 11/04/2013.  FINDINGS: 3.7 x 3.7 x 3.3 cm left adrenal mass again noted. This mass is heterogeneous in signal intensity on pre gadolinium T1 and T2 weighted images. However, there is a generalized loss of signal intensity throughout the lesion on out of phase dual echo images, indicative of significant intracellular lipid. Post-contrast images do demonstrate heterogeneous enhancement within the lesion, as well as posterior areas within the lesion which demonstrate little to no enhancement (suggesting internal areas of necrosis).  The visualized portions of the liver, gallbladder, pancreas, spleen, right adrenal gland and right kidney are unremarkable. Several small lesions in the left kidney are noted. The majority of these are low signal intensity on T1 weighted images, high signal intensity on T2 weighted images, and do not enhance, compatible with simple cysts. In addition, in the upper pole of the left kidney medially there is a sub cm lesion that is high signal intensity on T1 weighted images, low signal intensity on T2 weighted images, and does not enhance, compatible with a tiny proteinaceous or hemorrhagic cyst. Atherosclerotic plaque in the abdominal aorta.  IMPRESSION: 1. The left adrenal mass has imaging characteristics most compatible with a large partially necrotic adrenal adenoma, as detailed above. 2. Small simple cysts and small hemorrhagic or proteinaceous cyst in the left kidney  incidentally noted. 3. Atherosclerosis.   Electronically Signed   By: Vinnie Langton M.D.   On: 11/06/2013 13:49    Scheduled Meds: . aspirin EC  81 mg Oral Daily  . atorvastatin  80 mg Oral q1800  . clopidogrel  75 mg Oral Q breakfast  . digoxin  0.125 mg Oral Daily  . feeding supplement (ENSURE)  1 Container Oral TID WC  . heparin  5,000 Units Subcutaneous Q8H  . nicotine  21 mg Transdermal Daily  . predniSONE  10 mg Oral Q breakfast  . sodium chloride  3 mL Intravenous Q12H  . traZODone  50 mg Oral QHS   Continuous Infusions: . sodium chloride Stopped (11/07/13 0700)  . sodium chloride      Principal Problem:   Hyponatremia Active Problems:   COPD (chronic obstructive pulmonary disease)   Mass of adrenal gland   Smoker   Edentulous   Hyperthyroidism   Protein-calorie malnutrition, severe    Time spent: 35 minutes.     Dracen Reigle  [facility] Pager [phone]. If 7PM-7AM, please contact night-coverage at [email or web address], password [facility] 11/07/2013, 1:50 PM  LOS: 4 days

## 2013-11-07 NOTE — Progress Notes (Addendum)
Patient very agitated and shouting at staff post-cath.  Patient pulled off pressure dressing, refusing to comply with bedrest, frequent vitals, groin checks & IVF orders.  Right groin site level 0.  RN able to calm patient and have him lay back down in bed.  Dr. Terrence Dupont and Dr. Tyrell Antonio notified.  New orders received.  1 mg IV ativan administered. Will continue to monitor. [place], Ardeth Sportsman

## 2013-11-08 DIAGNOSIS — E279 Disorder of adrenal gland, unspecified: Secondary | ICD-10-CM

## 2013-11-08 DIAGNOSIS — E43 Unspecified severe protein-calorie malnutrition: Secondary | ICD-10-CM

## 2013-11-08 LAB — COMPREHENSIVE METABOLIC PANEL
ALK PHOS: 33 U/L — AB (ref 39–117)
ALT: 18 U/L (ref 0–53)
AST: 11 U/L (ref 0–37)
Albumin: 2.8 g/dL — ABNORMAL LOW (ref 3.5–5.2)
BUN: 17 mg/dL (ref 6–23)
CHLORIDE: 94 meq/L — AB (ref 96–112)
CO2: 25 meq/L (ref 19–32)
Calcium: 8.1 mg/dL — ABNORMAL LOW (ref 8.4–10.5)
Creatinine, Ser: 0.73 mg/dL (ref 0.50–1.35)
GFR calc Af Amer: >90 mL/min (ref >90)
GLUCOSE: 91 mg/dL (ref 70–99)
POTASSIUM: 4 meq/L (ref 3.7–5.3)
SODIUM: 131 meq/L — AB (ref 137–147)
Total Bilirubin: 0.6 mg/dL (ref 0.3–1.2)
Total Protein: 5.4 g/dL — ABNORMAL LOW (ref 6.0–8.3)

## 2013-11-08 LAB — CBC
HEMATOCRIT: 34.9 % — AB (ref 39.0–52.0)
HEMOGLOBIN: 12.2 g/dL — AB (ref 13.0–17.0)
MCH: 29.8 pg (ref 26.0–34.0)
MCHC: 35 g/dL (ref 30.0–36.0)
MCV: 85.1 fL (ref 78.0–100.0)
Platelets: 262 10*3/uL (ref 150–400)
RBC: 4.1 MIL/uL — ABNORMAL LOW (ref 4.22–5.81)
RDW: 15.8 % — ABNORMAL HIGH (ref 11.5–15.5)
WBC: 12.5 10*3/uL — AB (ref 4.0–10.5)

## 2013-11-08 MED ORDER — DIGOXIN 125 MCG PO TABS: 0.1250 mg | ORAL_TABLET | Freq: Every day | ORAL | Status: DC

## 2013-11-08 MED ORDER — PREDNISONE 10 MG PO TABS: 10.0000 mg | ORAL_TABLET | Freq: Every day | ORAL | Status: DC

## 2013-11-08 MED ORDER — NICOTINE 21 MG/24HR TD PT24: 21.0000 mg | MEDICATED_PATCH | Freq: Every day | TRANSDERMAL | Status: DC

## 2013-11-08 MED ORDER — ATORVASTATIN CALCIUM 80 MG PO TABS
80.0000 mg | ORAL_TABLET | Freq: Every day | ORAL | Status: DC
Start: 2013-11-08 — End: 2015-03-04

## 2013-11-08 MED ORDER — CARVEDILOL 3.125 MG PO TABS: 3.1250 mg | ORAL_TABLET | Freq: Two times a day (BID) | ORAL | Status: DC

## 2013-11-08 MED ORDER — IPRATROPIUM-ALBUTEROL 20-100 MCG/ACT IN AERS: 1.0000 | INHALATION_SPRAY | Freq: Four times a day (QID) | RESPIRATORY_TRACT | Status: DC | PRN

## 2013-11-08 MED ORDER — RAMIPRIL 1.25 MG PO CAPS: 1.2500 mg | ORAL_CAPSULE | Freq: Every day | ORAL | Status: DC

## 2013-11-08 NOTE — Evaluation (Signed)
Physical Therapy Evaluation Patient Details Name: Anthony Bolton MRN: [id number] DOB: 12-25-1950 Today's Date: 11/08/2013 Time: 9509-3267 PT Time Calculation (min): 17 min  PT Assessment / Plan / Recommendation History of Present Illness  y.o. male h/o COPD presents to ED with SOB.  This is the second time this week with identical presentation.  Patient was evaluated in ED and once again, was not found to have an overly severe COPD exacerbation (no wheezing, satting 99% on room air); however, just like 3 days ago, he was found to have hyponatremia with sodium of 120 (was 128 on discharge on 2/3).  Once again will re-admit for treatment of hyponatremia  Clinical Impression  Pt functioning near baseline. Pt with increased stability and safety with use of RW for ambulation. Pt safe to d/c home with assist of son whom pt reports currently stays with him once medically stable.    PT Assessment  Patent does not need any further PT services    Follow Up Recommendations  No PT follow up;Supervision/Assistance - 24 hour    Does the patient have the potential to tolerate intense rehabilitation      Barriers to Discharge        Equipment Recommendations  Rolling walker with 5" wheels    Recommendations for Other Services     Frequency      Precautions / Restrictions Precautions Precautions: Fall Restrictions Weight Bearing Restrictions: No   Pertinent Vitals/Pain Denies pain      Mobility  Bed Mobility Overal bed mobility: Modified Independent Transfers Overall transfer level: Modified independent General transfer comment: pt transferring self to/from bathroom, v/cs for safe hand placement for when using walker Ambulation/Gait Ambulation/Gait assistance: Supervision Ambulation Distance (Feet): 150 Feet Assistive device: Rolling walker (2 wheeled) Gait Pattern/deviations: Step-through pattern;Trunk flexed Gait velocity: wfl General Gait Details: v/c's for safe walker  management due to never using RW PTA. Pt reports feeling more stable with RW.    Exercises     PT Diagnosis:    PT Problem List:   PT Treatment Interventions:       PT Goals(Current goals can be found in the care plan section) Acute Rehab PT Goals Patient Stated Goal: home PT Goal Formulation: No goals set, d/c therapy  Visit Information  Last PT Received On: 11/08/13 Assistance Needed: +1 Reason Eval/Treat Not Completed:  (refused.) History of Present Illness: y.o. male h/o COPD presents to ED with SOB.  This is the second time this week with identical presentation.  Patient was evaluated in ED and once again, was not found to have an overly severe COPD exacerbation (no wheezing, satting 99% on room air); however, just like 3 days ago, he was found to have hyponatremia with sodium of 120 (was 128 on discharge on 2/3).  Once again will re-admit for treatment of hyponatremia       Prior [place] expects to be discharged to:: Private residence Living Arrangements: Children Available Help at Discharge: Family;Available 24 hours/day Type of Home: House Home Access: Level entry Home Layout: One level Home Equipment: None Prior Function Level of Independence: Independent Comments: son does the driving Communication Communication: HOH Dominant Hand: Right    Cognition  Cognition Arousal/Alertness: Awake/alert Behavior During Therapy: WFL for tasks assessed/performed Overall Cognitive Status: Within Functional Limits for tasks assessed    Extremity/Trunk Assessment Upper Extremity Assessment Upper Extremity Assessment: Overall WFL for tasks assessed Lower Extremity Assessment Lower Extremity Assessment: Overall WFL for tasks assessed Cervical / Trunk  Assessment Cervical / Trunk Assessment: Kyphotic   Balance Balance Overall balance assessment: Needs assistance Standing balance support: No upper extremity supported Standing balance-Leahy  Scale: Fair  End of Session PT - End of Session Equipment Utilized During Treatment: Gait belt Activity Tolerance: Patient tolerated treatment well Patient left: in bed;with call bell/phone within reach Nurse Communication: Mobility status (pt called son to come get him for d/c)  GP     Kelci Petrella, Knute Neu 11/08/2013, 2:38 PM   Kittie Plater, PT, DPT Pager #: [phone] Office #: [phone]

## 2013-11-08 NOTE — Progress Notes (Signed)
PT Cancellation Note  Patient Details Name: ORI TREJOS MRN: [id number] DOB: [birth year]   Cancelled Treatment:    Reason Eval/Treat Not Completed:  (refused.). Upon PT arrival pt hiding under blankets. When asked to participate with PT pt reports "I take myself to the bathroom. I've done it my whole life and while I've been here. I respectfully decline." Attempted to ask pt living arrangement and pt did not respond. Dr. Tana Coast arrived and attempted to  [facility] with pt in which he reports "I am tired and would like to rest. I advice you not to make me angry or else you will be very sorry." When asked if pt wanted to go home today, pt did not respond. Based on this interaction suspect pt may have cognitive deficits and should not be home alone at this time. PT to re-attempt as able for mobility assessment. Dr. Tana Coast aware of pt's lack of participation and also agree pt not safe to d/c home alone.   Kingsley Callander 11/08/2013, 11:23 AM  Kittie Plater, PT, DPT Pager #: [phone] Office #: [phone]

## 2013-11-08 NOTE — Progress Notes (Signed)
Subjective:  Patient denies any chest pain or shortness of breath. More awake today  Objective:  Vital Signs in the last 24 hours: Temp:  [98 F (36.7 C)] 98 F (36.7 C) (02/11 0611) Pulse Rate:  [75-99] 92 (02/11 0611) Resp:  [14-18] 18 (02/10 2100) BP: (90-128)/(58-94) 90/58 mmHg (02/11 0611) SpO2:  [95 %-99 %] 97 % (02/11 0611) Weight:  [51.256 kg (113 lb)] 51.256 kg (113 lb) (02/11 0611)  Intake/Output from previous day: 02/10 0701 - 02/11 0700 In: 360 [P.O.:360] Out: -  Intake/Output from this shift:    Physical Exam: Neck: no adenopathy, no carotid bruit, no JVD and supple, symmetrical, trachea midline Lungs: clear to auscultation bilaterally Heart: regular rate and rhythm and S1 and S2 soft no S3 gallop Abdomen: soft, non-tender; bowel sounds normal; no masses,  no organomegaly Extremities: extremities normal, atraumatic, no cyanosis or edema and Right groin stable no evidence of hematoma  Lab Results:  Recent Labs  11/07/13 0500 11/08/13 0333  WBC 13.9* 12.5*  HGB 12.7* 12.2*  PLT 302 262    Recent Labs  11/07/13 0500 11/08/13 0333  NA 134* 131*  K 4.6 4.0  CL 96 94*  CO2 25 25  GLUCOSE 96 91  BUN 24* 17  CREATININE 0.78 0.73    Recent Labs  11/05/13 1435 11/06/13 0417  TROPONINI <0.30 <0.30   Hepatic Function Panel  Recent Labs  11/08/13 0333  PROT 5.4*  ALBUMIN 2.8*  AST 11  ALT 18  ALKPHOS 33*  BILITOT 0.6   No results found for this basename: CHOL,  in the last 72 hours No results found for this basename: PROTIME,  in the last 72 hours  Imaging: Imaging results have been reviewed and Dg Eye Foreign Body  11/06/2013   CLINICAL DATA:  Metal working/exposure; clearance prior to MRI  EXAM: ORBITS FOR FOREIGN BODY - 2 VIEW  COMPARISON:  None.  FINDINGS: Water's views with eyes deviated toward the left and toward the right were obtained. There is no intraorbital radiopaque foreign body. No fracture or dislocation. Paranasal sinuses are  clear.  IMPRESSION: No evidence of metallic foreign body within the orbits.   Electronically Signed   By: Lowella Grip M.D.   On: 11/06/2013 10:48   Ct Head Wo Contrast  11/07/2013   CLINICAL DATA:  Confusion, agitation  EXAM: CT HEAD WITHOUT CONTRAST  TECHNIQUE: Contiguous axial images were obtained from the base of the skull through the vertex without intravenous contrast.  COMPARISON:  None.  FINDINGS: No evidence of parenchymal hemorrhage or extra-axial fluid collection. No mass lesion, mass effect, or midline shift.  No CT evidence of acute infarction.  Mild subcortical white matter and periventricular small vessel ischemic changes. Intracranial atherosclerosis.  Mild cortical atrophy.  No ventriculomegaly.  The visualized paranasal sinuses are essentially clear. The mastoid air cells are unopacified.  No evidence of calvarial fracture.  IMPRESSION: No evidence of acute intracranial abnormality.  Mild atrophy with small vessel ischemic changes and intracranial atherosclerosis.   Electronically Signed   By: Julian Hy M.D.   On: 11/07/2013 19:36   Mr Abdomen W Wo Contrast  11/06/2013   CLINICAL DATA:  Left adrenal mass.  EXAM: MRI ABDOMEN WITHOUT AND WITH CONTRAST  TECHNIQUE: Multiplanar multisequence MR imaging of the abdomen was performed both before and after the administration of intravenous contrast.  CONTRAST:  66mL MULTIHANCE GADOBENATE DIMEGLUMINE 529 MG/ML IV SOLN  COMPARISON:  Chest CT 11/04/2013.  FINDINGS: 3.7 x 3.7  x 3.3 cm left adrenal mass again noted. This mass is heterogeneous in signal intensity on pre gadolinium T1 and T2 weighted images. However, there is a generalized loss of signal intensity throughout the lesion on out of phase dual echo images, indicative of significant intracellular lipid. Post-contrast images do demonstrate heterogeneous enhancement within the lesion, as well as posterior areas within the lesion which demonstrate little to no enhancement (suggesting  internal areas of necrosis).  The visualized portions of the liver, gallbladder, pancreas, spleen, right adrenal gland and right kidney are unremarkable. Several small lesions in the left kidney are noted. The majority of these are low signal intensity on T1 weighted images, high signal intensity on T2 weighted images, and do not enhance, compatible with simple cysts. In addition, in the upper pole of the left kidney medially there is a sub cm lesion that is high signal intensity on T1 weighted images, low signal intensity on T2 weighted images, and does not enhance, compatible with a tiny proteinaceous or hemorrhagic cyst. Atherosclerotic plaque in the abdominal aorta.  IMPRESSION: 1. The left adrenal mass has imaging characteristics most compatible with a large partially necrotic adrenal adenoma, as detailed above. 2. Small simple cysts and small hemorrhagic or proteinaceous cyst in the left kidney incidentally noted. 3. Atherosclerosis.   Electronically Signed   By: Vinnie Langton M.D.   On: 11/06/2013 13:49    Cardiac Studies:  Assessment/Plan:  Nonischemic dilated cardiomyopathy  Compensated systolic congestive heart failure  Status post exacerbation of COPD  History of alcohol abuse  History of tobacco abuse  Adrenal mass  Status post hyponatremia etiology unclear  Depressed TSH rule out hyperthyroidism  Plan Continue present management Will up titrate beta blockers and ACE inhibitors as blood pressure tolerates as outpatient. Okay to discharge from cardiac point of view Followup with me in one to 2 weeks Post cardiac cath instructions have been given to patient  LOS: 5 days    Anthony Bolton N 11/08/2013, 8:35 AM

## 2013-11-08 NOTE — Discharge Summary (Signed)
Physician Discharge Summary  Patient ID: Anthony Bolton MRN: [id number] DOB/AGE: 04/12/1951 63 y.o.  Admit date: 11/03/2013 Discharge date: 11/08/2013  Primary Care Physician:  [name], MD  Discharge Diagnoses:    . COPD (chronic obstructive pulmonary disease) . Hyponatremia . Mass of adrenal gland- left  . Smoker . Edentulous  Consults:  Cardiology, Dr. Terrence Dupont                    Urology, Dr Junious Silk via phone         Recommendations for Outpatient Follow-up:  Please obtain BMET, CBC outpatient, TSH in 3-4 weeks   patient needs to follow up outpatient with urology, appointment made for the patient with Dr. Junious Silk    Allergies:  No Known Allergies   Discharge Medications:   Medication List         albuterol 108 (90 BASE) MCG/ACT inhaler  Commonly known as:  PROVENTIL HFA;VENTOLIN HFA  Inhale 1-2 puffs into the lungs every 6 (six) hours as needed for wheezing or shortness of breath.     aspirin 325 MG tablet  Take 325 mg by mouth every 6 (six) hours as needed.     atorvastatin 80 MG tablet  Commonly known as:  LIPITOR  Take 1 tablet (80 mg total) by mouth at bedtime.     carvedilol 3.125 MG tablet  Commonly known as:  COREG  Take 1 tablet (3.125 mg total) by mouth 2 (two) times daily with a meal.     digoxin 0.125 MG tablet  Commonly known as:  LANOXIN  Take 1 tablet (0.125 mg total) by mouth daily.     ipratropium-albuterol 0.5-2.5 (3) MG/3ML Soln  Commonly known as:  DUONEB  Inhale 3 mLs into the lungs every 6 (six) hours as needed.     Ipratropium-Albuterol 20-100 MCG/ACT Aers respimat  Commonly known as:  COMBIVENT  Inhale 1 puff into the lungs every 6 (six) hours as needed for wheezing or shortness of breath.     nicotine 21 mg/24hr patch  Commonly known as:  NICODERM CQ - dosed in mg/24 hours  Place 1 patch (21 mg total) onto the skin daily.     predniSONE 10 MG tablet  Commonly known as:  DELTASONE  Take 1 tablet (10 mg total) by  mouth daily with breakfast. X 3 days then off     ramipril 1.25 MG capsule  Commonly known as:  ALTACE  Take 1 capsule (1.25 mg total) by mouth daily.     tadalafil 5 MG tablet  Commonly known as:  CIALIS  Take 5 mg by mouth daily as needed for erectile dysfunction (ED).     traZODone 50 MG tablet  Commonly known as:  DESYREL  Take 50 mg by mouth at bedtime.         Brief H and P: For complete details please refer to admission H and P, but in briefJoseph D Bolton is a 63 y.o. male h/o COPD presents to ED with SOB. This is the second time this week with identical presentation. Patient was evaluated in ED and once again, was not found to have an overly severe COPD exacerbation (no wheezing, satting 99% on room air); however, just like 3 days ago, he was found to have hyponatremia with sodium of 120 (was 128 on discharge on 2/3). He was admitted for treatment of hyponatremia.   Hospital Course:  63 year old with PMH significant for COPD, hyponatremia, recently discharge from [facility]  2-04 after treatment of hyponatremia and COPD exacerbation who presented again 11/04/13 with worsening dyspnea, found to have sodium level at 121 on admission. During his work up for dyspnea an ECHO cardiogram was done which showed an EF of 20 %. Cardiology was consulted. Patient underwent Cardiac cath 2-10.   1-Hyponatremia: probably related to decrease volume. Sodium level improved with IV fluids. This make SIADH unlikely. No evidence of volume overload unlikely CHF. Sodium has increase to 131. Will probably need NSL fluids to avoid pulmonary edema.   2-New Cardiomyopathy; Ef 20%; started on aspirin, Coreg, ramipril, digoxin, statin. Cardiology was consulted and patient was seen by Dr. Terrence Dupont. Patient underwent cardiac cath on 2/10 which did not show any acute obstructive coronary artery disease. Per cardiology, will uptitrate beta blockers and ACE inhibitors as blood pressure tolerates as outpatient. patient  will followup with cardiology in 2 weeks   3-Hypotension; thought to be secondary to BP medications, currently stable  4-COPD; continue with nebulizer treatment and prednisone taper  5-Leukocytosis; secondary to steroids. Trending down.   6-Bipolar; trazodone.   7-Decrease TSH: TSH 0.085- Normal T4 = 1.37, T3 free = 2.4-- In setting of acute illness. He needs repeat TSH in 3-4 weeks.    8-Severe malnutrition; ensure TID.   9-Left adrenal adenoma, partially necrotic; per MRI. Dr Verlon Au discussed case with urology, Dr Junious Silk who recommends close outpatient followup mainly secondary to size. This in itself should not need surgery unless it becomes symptomatic in terms of pain or pressure effect. I made him an appointment with Dr. Junious Silk on 11/16/2013 and I instructed his son, [name] to follow up with all appointments.   10-Confusion: Patient was somewhat confused after the cardiac cath. PTOT evaluation was done today and patient was at baseline confirmed by his son at the bedside      Day of Discharge BP 90/58  Pulse 92  Temp(Src) 98 F (36.7 C) (Axillary)  Resp 18  Ht 5\' 9"  (1.753 m)  Wt 51.256 kg (113 lb)  BMI 16.68 kg/m2  SpO2 97%  Physical Exam: General: Alert and awake oriented not in any acute distress. HEENT: anicteric sclera, pupils reactive to light and accommodation CVS: S1-S2 clear no murmur rubs or gallops Chest: clear to auscultation bilaterally, no wheezing rales or rhonchi Abdomen: soft nontender, nondistended, normal bowel sounds Extremities: no cyanosis, clubbing or edema noted bilaterally    The results of significant diagnostics from this hospitalization (including imaging, microbiology, ancillary and laboratory) are listed below for reference.    LAB RESULTS: Basic Metabolic Panel:  Recent Labs Lab 11/07/13 0500 11/08/13 0333  NA 134* 131*  K 4.6 4.0  CL 96 94*  CO2 25 25  GLUCOSE 96 91  BUN 24* 17  CREATININE 0.78 0.73  CALCIUM  8.6 8.1*   Liver Function Tests:  Recent Labs Lab 11/06/13 0417 11/08/13 0333  AST 14 11  ALT 26 18  ALKPHOS 32* 33*  BILITOT 0.3 0.6  PROT 5.3* 5.4*  ALBUMIN 2.9* 2.8*   No results found for this basename: LIPASE, AMYLASE,  in the last 168 hours  Recent Labs Lab 11/07/13 1550  AMMONIA 29   CBC:  Recent Labs Lab 11/05/13 1435  11/07/13 0500 11/08/13 0333  WBC 15.9*  < > 13.9* 12.5*  NEUTROABS 14.5*  --   --   --   HGB 13.4  < > 12.7* 12.2*  HCT 38.1*  < > 36.7* 34.9*  MCV 83.9  < > 84.8 85.1  PLT 313  < > 302 262  < > = values in this interval not displayed. Cardiac Enzymes:  Recent Labs Lab 11/05/13 1435 11/06/13 0417  TROPONINI <0.30 <0.30   BNP: No components found with this basename: POCBNP,  CBG: No results found for this basename: GLUCAP,  in the last 168 hours  Significant Diagnostic Studies:  Ct Chest W Contrast  11/04/2013   CLINICAL DATA:  COPD.  Shortness of Breath.  Evaluate for cancer.  EXAM: CT CHEST WITH CONTRAST  TECHNIQUE: Multidetector CT imaging of the chest was performed during intravenous contrast administration.  CONTRAST:  146mL OMNIPAQUE IOHEXOL 300 MG/ML  SOLN  COMPARISON:  DG CHEST 2 VIEW dated 10/30/2013  FINDINGS: Moderate to severe centrilobular emphysema. Linear scarring in the lung bases. No pulmonary nodules or masses. No confluent airspace opacities. No effusions.  Heart is normal size. Aorta is normal caliber. No mediastinal, hilar, or axillary adenopathy. Chest wall soft tissues are unremarkable.  Left adrenal mass measures 3.9 cm. There appears to be internal enhancement. This is nonspecific on this contrast chest CT and warrants additional followup. No acute findings in the upper abdomen.  No acute bony abnormality.  IMPRESSION: Moderate to advanced centrilobular emphysema.  No suspicious nodules or lesions within the lungs.  3.9 cm heterogeneous left adrenal mass. This warrants additional workup and characterization. Recommend  adrenal protocol MRI with and without contrast for further characterization.   Electronically Signed   By: Rolm Baptise M.D.   On: 11/04/2013 14:41    2D ECHO: Study Conclusions  Left ventricle: The cavity size was normal. Wall thickness was normal. Systolic function was severely reduced. The estimated ejection fraction was in the range of 20% to 25%. Diffuse hypokinesis.    Disposition and Follow-up: Discharge Orders   Future Orders Complete By Expires   ([place]) Call MD:  Anytime you have any of the following symptoms: 1) 3 pound weight gain in 24 hours or 5 pounds in 1 week 2) shortness of breath, with or without a dry hacking cough 3) swelling in the hands, feet or stomach 4) if you have to sleep on extra pillows at night in order to breathe.  As directed    Diet - low sodium heart healthy  As directed    Increase activity slowly  As directed        DISPOSITION: Home  DIET: Heart healthy diet   TESTS THAT NEED FOLLOW-UP BMET, CBC   DISCHARGE FOLLOW-UP Follow-up Information   Follow up with [name], MD. Schedule an appointment as soon as possible for a visit in 10 days. (for hospital follow-up)    Specialty:  Family Medicine   Contact information:   [address] [place] [address] [phone]       Follow up with Clent Demark, MD. Schedule an appointment as soon as possible for a visit in 2 weeks. (for hospital follow-up)    Specialty:  Cardiology   Contact information:   [address] [place] [address] [phone]       Follow up with Fredricka Bonine, MD On 11/16/2013. (at 10:00AM for the adrenal mass)    Specialty:  Urology   Contact information:   [facility]  [place] [address] [phone]       Time spent on Discharge: 45 minutes  Signed:   RAI,RIPUDEEP M.D. [facility] 11/08/2013, 6:01 PM Pager: [phone]

## 2013-11-08 NOTE — Op Note (Signed)
NAMELEIBY, PIGEON NO.:  [id number]  MEDICAL RECORD NO.:  [id number]  LOCATION:  3W21C                        FACILITY:  [facility]  PHYSICIAN:  Allegra Lai. Terrence Dupont, M.D. DATE OF BIRTH:  07-Nov-1950  DATE OF PROCEDURE:  11/07/2013 DATE OF DISCHARGE:                              OPERATIVE REPORT   PROCEDURE:  Left cardiac cath with selective left and right coronary angiography, measurement of LVEDP via right groin using Judkins technique.  INDICATION FOR THE PROCEDURE:  Mr. Klippel is a 63 year old male with past medical history significant for COPD, history of tobacco abuse approximately 70 pack years, quit 20 years ago, was admitted because of progressive increasing shortness of breath, and was noted to be hyponatremic and was treated for exacerbation of COPD.  Cardiologic consultation is called as the patient had 2D echo, which showed EF of approximately 20-25% with global hypokinesis.  The patient does complains of vague sharp chest pain off and on for last few weeks, but did not seek any medical attention.  EKG done in the ER showed possible old anteroseptal MI.  The patient states he used to drink on weekends quit 20+ years ago, also used to smoke 2 to 2-1/2 packs per day for 30+ years, quit approximately 20 years ago.  Denies any cardiac workup in the past.  Denies any palpitation, lightheadedness or syncope.  Denies PND, orthopnea or leg swelling.  Due to acute congestive heart failure and questionable EKG changes suggestive of possible old septal MI and markedly depressed LV systolic function, discussed with the patient at length regarding left cardiac cath, possible PTCA stenting, its risks and benefits, i.e., death, MI, stroke, need for emergency CABG, local vascular complications, etc., and consented for PCI.  PROCEDURE:  After obtaining the informed consent, the patient was brought to the cath lab and was placed on fluoroscopy table.  Right groin was  prepped and draped in usual fashion.  Xylocaine 1% was used for local anesthesia in the right groin.  With the help of thin wall needle, 5-French arterial sheath was placed.  The sheath was aspirated and flushed.  Next, 5-French left Judkins catheter was advanced over the wire under fluoroscopic guidance up to the ascending aorta.  The wire was pulled out.  The catheter was aspirated and connected to the Manifold.  Catheter was further advanced and engaged into left coronary ostium.  Multiple views of the left system were taken.  Next, catheter was disengaged and was pulled out over the wire and was replaced with 5- [place] right Judkins catheter, which was advanced over the wire under fluoroscopic guidance up to the ascending aorta.  Wire was pulled out. The catheter was aspirated and connected to the Manifold.  Catheter was further advanced and engaged into right coronary ostium.  Multiple views of the right system were taken.  Next, catheter was disengaged and was pulled out over the wire and was replaced with 5-French pigtail catheter which was advanced over the wire under fluoroscopic guidance up to the ascending aorta.  Catheter was further advanced across the aortic valve into the LV.  LV pressures were recorded.  LVEDP was 9 mmHg.  Pullback pressures were recorded from  the aorta.  No gradient across the aortic valve.  Next, pigtail catheter was pulled out over the wire.  Sheaths were aspirated and flushed.  FINDINGS:  LVEDP was 9 mmHg.  Left main was patent.  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  Diagonal 1 and 2 were small which were patent.  Left circumflex was patent.  OM1 was large which was patent.  RCA was patent. PDA and PLV branches were small which were patent.  The patient tolerated the procedure well.  There were no complications.  The patient was transferred to recovery room in stable condition.  PLAN:  To restart him on beta-blockers and ACE inhibitors, and will up titrate  as outpatient as blood pressure tolerates.  The patient will need repeat 2D echo in few months.  If his EF remains below 35%, we will refer him for ICD.     Allegra Lai. Terrence Dupont, M.D.     MNH/MEDQ  D:  11/07/2013  T:  11/08/2013  Job:  [id number]

## 2013-11-10 LAB — THYROTROPIN RECEPTOR AUTOABS

## 2013-11-15 ENCOUNTER — Emergency Department (HOSPITAL_COMMUNITY)
Admission: EM | Admit: 2013-11-15 | Discharge: 2013-11-15 | Disposition: A | Discharge status: Home / Self Care | Payer: Medicaid Other | Attending: Emergency Medicine | Admitting: Emergency Medicine

## 2013-11-15 ENCOUNTER — Emergency Department (HOSPITAL_COMMUNITY): Payer: Medicaid Other

## 2013-11-15 ENCOUNTER — Encounter (HOSPITAL_COMMUNITY): Payer: Self-pay | Admitting: Emergency Medicine

## 2013-11-15 DIAGNOSIS — J441 Chronic obstructive pulmonary disease with (acute) exacerbation: Secondary | ICD-10-CM

## 2013-11-15 DIAGNOSIS — Z79899 Other long term (current) drug therapy: Secondary | ICD-10-CM | POA: Insufficient documentation

## 2013-11-15 DIAGNOSIS — Z9861 Coronary angioplasty status: Secondary | ICD-10-CM | POA: Insufficient documentation

## 2013-11-15 DIAGNOSIS — IMO0002 Reserved for concepts with insufficient information to code with codable children: Secondary | ICD-10-CM | POA: Insufficient documentation

## 2013-11-15 DIAGNOSIS — Z87891 Personal history of nicotine dependence: Secondary | ICD-10-CM | POA: Insufficient documentation

## 2013-11-15 DIAGNOSIS — Z7982 Long term (current) use of aspirin: Secondary | ICD-10-CM | POA: Insufficient documentation

## 2013-11-15 LAB — CBC WITH DIFFERENTIAL/PLATELET
Basophils Absolute: 0 10*3/uL (ref 0.0–0.1)
Basophils Relative: 0 % (ref 0–1)
EOS ABS: 0.2 10*3/uL (ref 0.0–0.7)
Eosinophils Relative: 2 % (ref 0–5)
HEMATOCRIT: 35.2 % — AB (ref 39.0–52.0)
HEMOGLOBIN: 12.3 g/dL — AB (ref 13.0–17.0)
LYMPHS ABS: 1.3 10*3/uL (ref 0.7–4.0)
Lymphocytes Relative: 12 % (ref 12–46)
MCH: 30.1 pg (ref 26.0–34.0)
MCHC: 34.9 g/dL (ref 30.0–36.0)
MCV: 86.1 fL (ref 78.0–100.0)
MONOS PCT: 7 % (ref 3–12)
Monocytes Absolute: 0.7 10*3/uL (ref 0.1–1.0)
Neutro Abs: 8.9 10*3/uL — ABNORMAL HIGH (ref 1.7–7.7)
Neutrophils Relative %: 80 % — ABNORMAL HIGH (ref 43–77)
Platelets: 250 10*3/uL (ref 150–400)
RBC: 4.09 MIL/uL — ABNORMAL LOW (ref 4.22–5.81)
RDW: 15.4 % (ref 11.5–15.5)
WBC: 11.1 10*3/uL — ABNORMAL HIGH (ref 4.0–10.5)

## 2013-11-15 LAB — BASIC METABOLIC PANEL
BUN: 12 mg/dL (ref 6–23)
CHLORIDE: 91 meq/L — AB (ref 96–112)
CO2: 29 mEq/L (ref 19–32)
Calcium: 8.8 mg/dL (ref 8.4–10.5)
Creatinine, Ser: 0.71 mg/dL (ref 0.50–1.35)
GFR calc Af Amer: >90 mL/min (ref >90)
GLUCOSE: 107 mg/dL — AB (ref 70–99)
Potassium: 4.5 mEq/L (ref 3.7–5.3)
Sodium: 130 mEq/L — ABNORMAL LOW (ref 137–147)

## 2013-11-15 LAB — TROPONIN I: Troponin I: <0.3 ng/mL (ref <0.30)

## 2013-11-15 MED ORDER — PREDNISONE 10 MG PO TABS: 60.0000 mg | ORAL_TABLET | Freq: Every day | ORAL | Status: DC

## 2013-11-15 MED ORDER — OXYCODONE-ACETAMINOPHEN 5-325 MG PO TABS
1.0000 | ORAL_TABLET | Freq: Once | ORAL | Status: AC
  Administered 2013-11-15: 1 via ORAL
  Filled 2013-11-15: qty 1

## 2013-11-15 MED ORDER — ALBUTEROL SULFATE HFA 108 (90 BASE) MCG/ACT IN AERS: 2.0000 | INHALATION_SPRAY | RESPIRATORY_TRACT | Status: DC | PRN

## 2013-11-15 MED ORDER — ALBUTEROL SULFATE (2.5 MG/3ML) 0.083% IN NEBU
5.0000 mg | INHALATION_SOLUTION | Freq: Once | RESPIRATORY_TRACT | Status: AC
  Administered 2013-11-15: 5 mg via RESPIRATORY_TRACT
  Filled 2013-11-15: qty 6

## 2013-11-15 MED ORDER — PREDNISONE 20 MG PO TABS
60.0000 mg | ORAL_TABLET | Freq: Once | ORAL | Status: AC
  Administered 2013-11-15: 60 mg via ORAL
  Filled 2013-11-15: qty 3

## 2013-11-15 MED ORDER — IPRATROPIUM BROMIDE 0.02 % IN SOLN
0.5000 mg | Freq: Once | RESPIRATORY_TRACT | Status: AC
  Administered 2013-11-15: 0.5 mg via RESPIRATORY_TRACT
  Filled 2013-11-15: qty 2.5

## 2013-11-15 NOTE — ED Notes (Signed)
Pt complaining of increased SOB and CP for the past 2-3 days. CP is 5/10. EMS gave 324 ASA and 1 nitro which resolved CP. Pt is pursed lip breathing- which EMS states is his baseline. 94% on room air. 160/100 BP, EKG showed sinus rhythm.

## 2013-11-15 NOTE — ED Provider Notes (Signed)
CSN: [id number]     Arrival date & time 11/15/13  72 History   First MD Initiated Contact with Patient 11/15/13 1318     Chief Complaint  Patient presents with  . Shortness of Breath  . Chest Pain      The history is provided by the patient.   Patient presents emergency department complaining of shortness of breath and chest pain over the past 2-3 days.  EMS gave aspirin as well as nitroglycerin which seemed to resolve the patient's chest pain.  He states he still feels short of breath and was noted to be doing purse lipped breathing.  No known history of coronary artery disease.  Former smoker   Past Medical History  Diagnosis Date  . COPD (chronic obstructive pulmonary disease)    Past Surgical History  Procedure Laterality Date  . Appendectomy    . Cardiac stents     History reviewed. No pertinent family history. History  Substance Use Topics  . Smoking status: Former Smoker    Types: Cigarettes  . Smokeless tobacco: Never Used  . Alcohol Use: No    Review of Systems  Respiratory: Positive for shortness of breath.   Cardiovascular: Positive for chest pain.  All other systems reviewed and are negative.      Allergies  Review of patient's allergies indicates no known allergies.  Home Medications   Current Outpatient Rx  Name  Route  Sig  Dispense  Refill  . albuterol (PROVENTIL HFA;VENTOLIN HFA) 108 (90 BASE) MCG/ACT inhaler   Inhalation   Inhale 1-2 puffs into the lungs every 6 (six) hours as needed for wheezing or shortness of breath.   1 Inhaler   0   . albuterol (PROVENTIL HFA;VENTOLIN HFA) 108 (90 BASE) MCG/ACT inhaler   Inhalation   Inhale 2 puffs into the lungs every 4 (four) hours as needed for wheezing or shortness of breath.   1 Inhaler   0   . aspirin 325 MG tablet   Oral   Take 325 mg by mouth every 6 (six) hours as needed.         Marland Kitchen atorvastatin (LIPITOR) 80 MG tablet   Oral   Take 1 tablet (80 mg total) by mouth at bedtime.   30  tablet   4   . carvedilol (COREG) 3.125 MG tablet   Oral   Take 1 tablet (3.125 mg total) by mouth 2 (two) times daily with a meal.   60 tablet   3   . digoxin (LANOXIN) 0.125 MG tablet   Oral   Take 1 tablet (0.125 mg total) by mouth daily.   30 tablet   3   . Ipratropium-Albuterol (COMBIVENT) 20-100 MCG/ACT AERS respimat   Inhalation   Inhale 1 puff into the lungs every 6 (six) hours as needed for wheezing or shortness of breath.   1 Inhaler   4   . ipratropium-albuterol (DUONEB) 0.5-2.5 (3) MG/3ML SOLN   Inhalation   Inhale 3 mLs into the lungs every 6 (six) hours as needed.   360 mL   0   . nicotine (NICODERM CQ - DOSED IN MG/24 HOURS) 21 mg/24hr patch   Transdermal   Place 1 patch (21 mg total) onto the skin daily.   28 patch   0   . predniSONE (DELTASONE) 10 MG tablet   Oral   Take 1 tablet (10 mg total) by mouth daily with breakfast. X 3 days then off   3 tablet  0   . predniSONE (DELTASONE) 10 MG tablet   Oral   Take 6 tablets (60 mg total) by mouth daily.   30 tablet   0   . ramipril (ALTACE) 1.25 MG capsule   Oral   Take 1 capsule (1.25 mg total) by mouth daily.   30 capsule   3   . tadalafil (CIALIS) 5 MG tablet   Oral   Take 5 mg by mouth daily as needed for erectile dysfunction (ED).         . traZODone (DESYREL) 50 MG tablet   Oral   Take 50 mg by mouth at bedtime.          BP 112/77  Pulse 55  Temp(Src) 98.2 F (36.8 C) (Oral)  Resp 15  Ht 5\' 9"  (1.753 m)  Wt 111 lb (50.349 kg)  BMI 16.38 kg/m2  SpO2 100% Physical Exam  Nursing note and vitals reviewed. Constitutional: He is oriented to person, place, and time. He appears well-developed and well-nourished.  HENT:  Head: Normocephalic and atraumatic.  Eyes: EOM are normal.  Neck: Normal range of motion.  Cardiovascular: Normal rate, regular rhythm, normal heart sounds and intact distal pulses.   Pulmonary/Chest: Effort normal and breath sounds normal. No respiratory  distress.  Abdominal: Soft. He exhibits no distension. There is no tenderness.  Musculoskeletal: Normal range of motion.  Neurological: He is alert and oriented to person, place, and time.  Skin: Skin is warm and dry.  Psychiatric: He has a normal mood and affect. Judgment normal.    ED Course  Procedures (including critical care time) Labs Review Labs Reviewed  CBC WITH DIFFERENTIAL - Abnormal; Notable for the following:    WBC 11.1 (*)    RBC 4.09 (*)    Hemoglobin 12.3 (*)    HCT 35.2 (*)    Neutrophils Relative % 80 (*)    Neutro Abs 8.9 (*)    All other components within normal limits  BASIC METABOLIC PANEL - Abnormal; Notable for the following:    Sodium 130 (*)    Chloride 91 (*)    Glucose, Bld 107 (*)    All other components within normal limits  TROPONIN I   Imaging Review Dg Chest 2 View  11/15/2013   CLINICAL DATA:  Shortness of Breath  EXAM: CHEST  2 VIEW  COMPARISON:  11/04/2013  FINDINGS: Cardiomediastinal silhouette is stable. No acute infiltrate or pleural effusion. No pulmonary edema. Mild hyperinflation again noted.  IMPRESSION: No active cardiopulmonary disease.  Hyperinflation again noted.   Electronically Signed   By: Lahoma Crocker M.D.   On: 11/15/2013 14:28  I personally reviewed the imaging tests through PACS system I reviewed available ER/hospitalization records through the EMR   EKG Interpretation    Date/Time:  Wednesday November 15 2013 13:27:41 EST Ventricular Rate:  62 PR Interval:  144 QRS Duration: 82 QT Interval:  416 QTC Calculation: 422 R Axis:   87 Text Interpretation:  Normal sinus rhythm Anterior infarct , age undetermined Abnormal ECG No significant change was found Confirmed by Jayani Rozman  MD, Jushua Waltman ([id number]) on 11/15/2013 4:24:07 PM            MDM   Final diagnoses:  COPD exacerbation    Patient's symptoms began much better after albuterol.  As his steroids.  Patient will be sent home for COPD exacerbation    Hoy Morn, MD 11/15/13 [phone]

## 2013-11-15 NOTE — Discharge Instructions (Signed)
Chronic Obstructive Pulmonary Disease Exacerbation °Chronic obstructive pulmonary disease (COPD) is a common lung condition in which airflow from the lungs is limited. COPD is a general term that can be used to describe many different lung problems that limit airflow, including chronic bronchitis and emphysema. COPD exacerbations are episodes when breathing symptoms become much worse and require extra treatment. Without treatment, COPD exacerbations can be life threatening, and frequent COPD exacerbations can cause further damage to your lungs. °CAUSES  °· Respiratory infections.   °· Exposure to smoke.   °· Exposure to air pollution, chemical fumes, or dust. °Sometimes there is no apparent cause or trigger. °RISK FACTORS °· Smoking cigarettes. °· Older age. °· Frequent prior COPD exacerbations. °SIGNS AND SYMPTOMS  °· Increased coughing.   °· Increased thick spit (sputum) production.   °· Increased wheezing.   °· Increased shortness of breath.   °· Rapid breathing.   °· Chest tightness. °DIAGNOSIS  °Your medical history, a physical exam, and tests will help your health care provider make a diagnosis. Tests may include: °· A chest X-ray. °· Basic lab tests. °· Sputum testing. °· An arterial blood gas test. °TREATMENT  °Depending on the severity of your COPD exacerbation, you may need to be admitted to a hospital for treatment. Some of the treatments commonly used to treat COPD exacerbations are:  °· Antibiotic medicines.   °· Bronchodilators. These are drugs that expand the air passages. They may be given with an inhaler or nebulizer. Spacer devices may be needed to help improve drug delivery. °· Corticosteroid medicines. °· Supplemental oxygen therapy.   °HOME CARE INSTRUCTIONS  °· Do not smoke. Quitting smoking is very important to prevent COPD from getting worse and exacerbations from happening as often. °· Avoid exposure to all substances that irritate the airway, especially to tobacco smoke.   °· If prescribed,  take your antibiotics as directed. Finish them even if you start to feel better. °· Only take over-the-counter or prescription medicines as directed by your health care provider. It is important to use correct technique with inhaled medicines. °· Drink enough fluids to keep your urine clear or pale yellow (unless you have a medical condition that requires fluid restriction). °· Use a cool mist vaporizer. This makes it easier to clear your chest when you cough.   °· If you have a home nebulizer and oxygen, continue to use them as directed.   °· Maintain all necessary vaccinations to prevent infections.   °· Exercise regularly.   °· Eat a healthy diet.   °· Keep all follow-up appointments as directed by your health care provider. °SEEK IMMEDIATE MEDICAL CARE IF: °· You have worsening shortness of breath.   °· You have trouble talking.   °· You have severe chest pain. °· You have blood in your sputum.  °· You have a fever. °· You have weakness, vomit repeatedly, or faint.   °· You feel confused.   °· You continue to get worse. °MAKE SURE YOU:  °· Understand these instructions. °· Will watch your condition. °· Will get help right away if you are not doing well or get worse. °Document Released: 07/12/2007 Document Revised: 07/05/2013 Document Reviewed: 05/19/2013 °[employer]® Patient Information ©2014 [employer]. ° °

## 2014-09-06 ENCOUNTER — Encounter (HOSPITAL_COMMUNITY): Payer: Self-pay | Admitting: Cardiology

## 2014-10-10 ENCOUNTER — Emergency Department (HOSPITAL_COMMUNITY): Payer: Medicaid Other

## 2014-10-10 ENCOUNTER — Encounter (HOSPITAL_COMMUNITY): Payer: Self-pay | Admitting: Emergency Medicine

## 2014-10-10 ENCOUNTER — Emergency Department (HOSPITAL_COMMUNITY)
Admission: EM | Admit: 2014-10-10 | Discharge: 2014-10-10 | Disposition: A | Discharge status: Home / Self Care | Payer: Medicaid Other | Attending: Emergency Medicine | Admitting: Emergency Medicine

## 2014-10-10 DIAGNOSIS — R0600 Dyspnea, unspecified: Secondary | ICD-10-CM | POA: Diagnosis present

## 2014-10-10 DIAGNOSIS — Z7951 Long term (current) use of inhaled steroids: Secondary | ICD-10-CM | POA: Insufficient documentation

## 2014-10-10 DIAGNOSIS — Z87891 Personal history of nicotine dependence: Secondary | ICD-10-CM | POA: Insufficient documentation

## 2014-10-10 DIAGNOSIS — J441 Chronic obstructive pulmonary disease with (acute) exacerbation: Secondary | ICD-10-CM | POA: Insufficient documentation

## 2014-10-10 LAB — BASIC METABOLIC PANEL
Anion gap: 9 (ref 5–15)
BUN: 14 mg/dL (ref 6–23)
CO2: 27 mmol/L (ref 19–32)
CREATININE: 0.74 mg/dL (ref 0.50–1.35)
Calcium: 9 mg/dL (ref 8.4–10.5)
Chloride: 94 mEq/L — ABNORMAL LOW (ref 96–112)
GFR calc Af Amer: >90 mL/min (ref >90)
GFR calc non Af Amer: >90 mL/min (ref >90)
Glucose, Bld: 118 mg/dL — ABNORMAL HIGH (ref 70–99)
Potassium: 4.5 mmol/L (ref 3.5–5.1)
SODIUM: 130 mmol/L — AB (ref 135–145)

## 2014-10-10 LAB — I-STAT TROPONIN, ED: Troponin i, poc: 0 ng/mL (ref 0.00–0.08)

## 2014-10-10 LAB — CBC
HEMATOCRIT: 41.6 % (ref 39.0–52.0)
Hemoglobin: 14 g/dL (ref 13.0–17.0)
MCH: 30.8 pg (ref 26.0–34.0)
MCHC: 33.7 g/dL (ref 30.0–36.0)
MCV: 91.6 fL (ref 78.0–100.0)
Platelets: 257 10*3/uL (ref 150–400)
RBC: 4.54 MIL/uL (ref 4.22–5.81)
RDW: 16.9 % — ABNORMAL HIGH (ref 11.5–15.5)
WBC: 4.6 10*3/uL (ref 4.0–10.5)

## 2014-10-10 MED ORDER — IPRATROPIUM-ALBUTEROL 20-100 MCG/ACT IN AERS: 1.0000 | INHALATION_SPRAY | Freq: Four times a day (QID) | RESPIRATORY_TRACT | Status: DC | PRN

## 2014-10-10 MED ORDER — ALBUTEROL SULFATE HFA 108 (90 BASE) MCG/ACT IN AERS: 2.0000 | INHALATION_SPRAY | RESPIRATORY_TRACT | Status: DC | PRN

## 2014-10-10 MED ORDER — PREDNISONE 10 MG PO TABS: 40.0000 mg | ORAL_TABLET | Freq: Every day | ORAL | Status: DC

## 2014-10-10 MED ORDER — AZITHROMYCIN 250 MG PO TABS: 250.0000 mg | ORAL_TABLET | Freq: Every day | ORAL | Status: DC

## 2014-10-10 MED ORDER — METHYLPREDNISOLONE SODIUM SUCC 125 MG IJ SOLR
125.0000 mg | Freq: Once | INTRAMUSCULAR | Status: AC
  Administered 2014-10-10: 125 mg via INTRAVENOUS
  Filled 2014-10-10: qty 2

## 2014-10-10 NOTE — ED Notes (Signed)
Took patient for a walk around the emergency room and throughout the walk his pulse oximetry remain at 95 percent and throughout the walk it drop to 92 percent. Pt. Stated that he felt a little SOB but otherwise he felt well.

## 2014-10-10 NOTE — ED Notes (Signed)
Per EMS: Pt states that he feels like he "has ran 30 miles".  Pt speaking in 3 word sentences upon ems arrival.  No accessory muscle use.  Hx of COPD and has not been able to see his PCP due to financial reasons.  Insp and exp wheezes in all lobes.  Total of 10 mg of albuterol and 1 mg of atrovent given en route with some improvement.

## 2014-10-10 NOTE — ED Provider Notes (Signed)
CSN: [id number]     Arrival date & time 10/10/14  1451 History   First MD Initiated Contact with Patient 10/10/14 1534     Chief Complaint  Patient presents with  . Respiratory Distress    The history is provided by the patient. No language interpreter was used.   Mr. Fortenberry presents for evaluation of shortness of breath. Reports increased shortness of breath and cough productive of white and yellow sputum for the last 3 days. His symptoms started since running out of his COPD medications. He attempted to go to his PCP yesterday but could not get there due to transportation problems. He received albuterol and Atrovent by EMS prior to ED arrival and he reports his shortness of breath is much improved. He denies any chest pain, fevers, leg swelling or pain, abdominal pain, vomiting. Symptoms are moderate, constant but worse with exertion, worsening.  Past Medical History  Diagnosis Date  . COPD (chronic obstructive pulmonary disease)    Past Surgical History  Procedure Laterality Date  . Appendectomy    . Cardiac stents    . Left heart catheterization with coronary angiogram N/A 11/07/2013    Procedure: LEFT HEART CATHETERIZATION WITH CORONARY ANGIOGRAM;  Surgeon: Clent Demark, MD;  Location: [facility] CATH LAB;  Service: Cardiovascular;  Laterality: N/A;   No family history on file. History  Substance Use Topics  . Smoking status: Former Smoker    Types: Cigarettes  . Smokeless tobacco: Never Used  . Alcohol Use: No    Review of Systems  All other systems reviewed and are negative.     Allergies  Review of patient's allergies indicates no known allergies.  Home Medications   Prior to Admission medications   Medication Sig Start Date End Date Taking? Authorizing Provider  albuterol (PROVENTIL HFA;VENTOLIN HFA) 108 (90 BASE) MCG/ACT inhaler Inhale 2 puffs into the lungs every 4 (four) hours as needed for wheezing or shortness of breath. 11/15/13  Yes Hoy Morn, MD  aspirin  325 MG tablet Take 650 mg by mouth every 6 (six) hours as needed.    Yes Historical Provider, MD  Ipratropium-Albuterol (COMBIVENT) 20-100 MCG/ACT AERS respimat Inhale 1 puff into the lungs every 6 (six) hours as needed for wheezing or shortness of breath. 11/08/13  Yes Ripudeep Krystal Eaton, MD  nicotine (NICODERM CQ - DOSED IN MG/24 HOURS) 21 mg/24hr patch Place 1 patch (21 mg total) onto the skin daily. 11/08/13  Yes Ripudeep Krystal Eaton, MD  albuterol (PROVENTIL HFA;VENTOLIN HFA) 108 (90 BASE) MCG/ACT inhaler Inhale 1-2 puffs into the lungs every 6 (six) hours as needed for wheezing or shortness of breath. Patient not taking: Reported on 10/10/2014 11/01/13   Robbie Lis, MD  atorvastatin (LIPITOR) 80 MG tablet Take 1 tablet (80 mg total) by mouth at bedtime. Patient not taking: Reported on 10/10/2014 11/08/13   Ripudeep Krystal Eaton, MD  carvedilol (COREG) 3.125 MG tablet Take 1 tablet (3.125 mg total) by mouth 2 (two) times daily with a meal. Patient not taking: Reported on 10/10/2014 11/08/13   Ripudeep Krystal Eaton, MD  digoxin (LANOXIN) 0.125 MG tablet Take 1 tablet (0.125 mg total) by mouth daily. Patient not taking: Reported on 10/10/2014 11/08/13   Ripudeep K Rai, MD  ipratropium-albuterol (DUONEB) 0.5-2.5 (3) MG/3ML SOLN Inhale 3 mLs into the lungs every 6 (six) hours as needed. 11/01/13   Robbie Lis, MD  predniSONE (DELTASONE) 10 MG tablet Take 1 tablet (10 mg total) by mouth daily with  breakfast. X 3 days then off Patient not taking: Reported on 10/10/2014 11/08/13   Ripudeep Krystal Eaton, MD  predniSONE (DELTASONE) 10 MG tablet Take 6 tablets (60 mg total) by mouth daily. Patient not taking: Reported on 10/10/2014 11/15/13   Hoy Morn, MD  ramipril (ALTACE) 1.25 MG capsule Take 1 capsule (1.25 mg total) by mouth daily. Patient not taking: Reported on 10/10/2014 11/08/13   Ripudeep K Rai, MD   BP 151/93 mmHg  Pulse 92  Temp(Src) 98 F (36.7 C) (Oral)  Resp 19  SpO2 100% Physical Exam  Constitutional: He is oriented to  person, place, and time. He appears well-developed and well-nourished.  HENT:  Head: Normocephalic and atraumatic.  Cardiovascular: Normal rate and regular rhythm.   No murmur heard. Pulmonary/Chest: Effort normal. No respiratory distress.  Occasional end expiratory wheezes. Speaks in full sentences.  Abdominal: Soft. There is no tenderness. There is no rebound and no guarding.  Musculoskeletal: He exhibits no edema or tenderness.  Neurological: He is alert and oriented to person, place, and time.  Skin: Skin is warm and dry.  Psychiatric: He has a normal mood and affect. His behavior is normal.  Nursing note and vitals reviewed.   ED Course  Procedures (including critical care time) Labs Review Labs Reviewed  BASIC METABOLIC PANEL - Abnormal; Notable for the following:    Sodium 130 (*)    Chloride 94 (*)    Glucose, Bld 118 (*)    All other components within normal limits  CBC - Abnormal; Notable for the following:    RDW 16.9 (*)    All other components within normal limits  I-STAT TROPOININ, ED    Imaging Review Dg Chest 2 View (if Patient Has Fever And/or Copd)  10/10/2014   CLINICAL DATA:  Cough and shortness of breath for several weeks, chest pain for 1 week, unable to sleep on LEFT side, history hypertension, COPD, former smoker  EXAM: CHEST  2 VIEW  COMPARISON:  11/15/2013  FINDINGS: Normal heart size, mediastinal contours, and pulmonary vascularity.  BILATERAL nipple shadows.  Emphysematous changes consistent with COPD.  No acute infiltrate, pleural effusion or pneumothorax.  Bones unremarkable.  IMPRESSION: COPD changes.  No acute abnormalities.   Electronically Signed   By: Lavonia Dana M.D.   On: 10/10/2014 15:26     EKG Interpretation None      MDM   Final diagnoses:  COPD with acute exacerbation    Patient with history of COPD here with shortness of breath after being out of his Combivent for the last 3 days. Patient is in no respiratory distress in the  emergency department is able to walk without difficulty and no desaturations. Patient did receive treatment prior to ED arrival. Clinical picture is consistent with COPD exacerbation. There is no evidence of pneumonia no respiratory distress, clinical picture is not consistent with PE or CHF. Discussed with patient homecare for COPD exacerbation as well as importance of continuing his medications. Prescriptions written for Combivent, albuterol, prednisone, Z-Pak. Patient is noted to have mild hyponatremia which has been present on multiple prior lab evaluations, this is stable    Quintella Reichert, MD 10/10/14 1725

## 2014-10-10 NOTE — Discharge Instructions (Signed)

## 2014-10-22 ENCOUNTER — Encounter (HOSPITAL_COMMUNITY): Payer: Self-pay | Admitting: Emergency Medicine

## 2014-10-22 ENCOUNTER — Emergency Department (HOSPITAL_COMMUNITY)
Admission: EM | Admit: 2014-10-22 | Discharge: 2014-10-22 | Disposition: A | Discharge status: Home / Self Care | Payer: Medicaid Other | Attending: Emergency Medicine | Admitting: Emergency Medicine

## 2014-10-22 ENCOUNTER — Emergency Department (HOSPITAL_COMMUNITY): Payer: Medicaid Other

## 2014-10-22 DIAGNOSIS — Z79899 Other long term (current) drug therapy: Secondary | ICD-10-CM | POA: Diagnosis not present

## 2014-10-22 DIAGNOSIS — R0602 Shortness of breath: Secondary | ICD-10-CM | POA: Diagnosis present

## 2014-10-22 DIAGNOSIS — E871 Hypo-osmolality and hyponatremia: Secondary | ICD-10-CM | POA: Insufficient documentation

## 2014-10-22 DIAGNOSIS — Z87891 Personal history of nicotine dependence: Secondary | ICD-10-CM | POA: Insufficient documentation

## 2014-10-22 DIAGNOSIS — R079 Chest pain, unspecified: Secondary | ICD-10-CM | POA: Diagnosis not present

## 2014-10-22 DIAGNOSIS — Z7982 Long term (current) use of aspirin: Secondary | ICD-10-CM | POA: Diagnosis not present

## 2014-10-22 DIAGNOSIS — J441 Chronic obstructive pulmonary disease with (acute) exacerbation: Secondary | ICD-10-CM | POA: Diagnosis not present

## 2014-10-22 DIAGNOSIS — Z7952 Long term (current) use of systemic steroids: Secondary | ICD-10-CM | POA: Insufficient documentation

## 2014-10-22 LAB — BASIC METABOLIC PANEL
Anion gap: 6 (ref 5–15)
BUN: 15 mg/dL (ref 6–23)
CHLORIDE: 95 mmol/L — AB (ref 96–112)
CO2: 28 mmol/L (ref 19–32)
Calcium: 8.8 mg/dL (ref 8.4–10.5)
Creatinine, Ser: 0.73 mg/dL (ref 0.50–1.35)
GFR calc non Af Amer: >90 mL/min (ref >90)
GLUCOSE: 112 mg/dL — AB (ref 70–99)
Potassium: 4.2 mmol/L (ref 3.5–5.1)
Sodium: 129 mmol/L — ABNORMAL LOW (ref 135–145)

## 2014-10-22 LAB — CBC
HEMATOCRIT: 34.9 % — AB (ref 39.0–52.0)
Hemoglobin: 11.9 g/dL — ABNORMAL LOW (ref 13.0–17.0)
MCH: 30.9 pg (ref 26.0–34.0)
MCHC: 34.1 g/dL (ref 30.0–36.0)
MCV: 90.6 fL (ref 78.0–100.0)
PLATELETS: 170 10*3/uL (ref 150–400)
RBC: 3.85 MIL/uL — ABNORMAL LOW (ref 4.22–5.81)
RDW: 17.9 % — ABNORMAL HIGH (ref 11.5–15.5)
WBC: 6.2 10*3/uL (ref 4.0–10.5)

## 2014-10-22 LAB — I-STAT TROPONIN, ED: TROPONIN I, POC: 0.02 ng/mL (ref 0.00–0.08)

## 2014-10-22 MED ORDER — PREDNISONE 20 MG PO TABS: 60.0000 mg | ORAL_TABLET | Freq: Every day | ORAL | Status: DC

## 2014-10-22 MED ORDER — IPRATROPIUM-ALBUTEROL 0.5-2.5 (3) MG/3ML IN SOLN: 3.0000 mL | Freq: Four times a day (QID) | RESPIRATORY_TRACT | Status: DC | PRN

## 2014-10-22 MED ORDER — IPRATROPIUM-ALBUTEROL 20-100 MCG/ACT IN AERS: 1.0000 | INHALATION_SPRAY | Freq: Four times a day (QID) | RESPIRATORY_TRACT | Status: DC | PRN

## 2014-10-22 MED ORDER — IPRATROPIUM-ALBUTEROL 0.5-2.5 (3) MG/3ML IN SOLN
3.0000 mL | Freq: Once | RESPIRATORY_TRACT | Status: AC
  Administered 2014-10-22: 3 mL via RESPIRATORY_TRACT
  Filled 2014-10-22: qty 3

## 2014-10-22 MED ORDER — ALBUTEROL SULFATE HFA 108 (90 BASE) MCG/ACT IN AERS: 2.0000 | INHALATION_SPRAY | RESPIRATORY_TRACT | Status: DC | PRN

## 2014-10-22 MED ORDER — IPRATROPIUM-ALBUTEROL 20-100 MCG/ACT IN AERS
1.0000 | INHALATION_SPRAY | Freq: Four times a day (QID) | RESPIRATORY_TRACT | Status: DC
  Administered 2014-10-22: 1 via RESPIRATORY_TRACT
  Filled 2014-10-22: qty 4

## 2014-10-22 NOTE — ED Notes (Signed)
Pt HOH.

## 2014-10-22 NOTE — ED Provider Notes (Signed)
CSN: [id number]     Arrival date & time 10/22/14  0307 History  This chart was scribed for Kalman Drape, MD by Chester Holstein, ED Scribe. This patient was seen in room A01C/A01C and the patient's care was started at 4:08 AM.    Chief Complaint  Patient presents with  . Shortness of Breath     Patient is a 64 y.o. male presenting with shortness of breath. The history is provided by the patient. No language interpreter was used.  Shortness of Breath Associated symptoms: chest pain and cough   Associated symptoms: no abdominal pain and no fever    HPI Comments: HPI Comments: Anthony Bolton is a 64 y.o. male brought in by ambulance, with PMHx of COPD,  Flippin of heart cath in 10/2013 , with clean coronaries who presents to the Emergency Department complaining of SOB with onset 2 days ago. Pt notes associated left sided chest pain, congestion, and cough. Pt states he has been out of his Combivent for 2 days. Pt had an appointment with Dr. Criss Rosales on January 12, but notes office was closed. Pt denies abdominal pain.  No fevers, chills.  No change in his sputum.  Patient with similar presentation on the 13th.  Patient has received one albuterol, 1 DuoNeb, and 125 of Solu-Medrol.  No wheezing currently.  Patient is speaking in full sentences.  No respiratory distress.  Past Medical History  Diagnosis Date  . COPD (chronic obstructive pulmonary disease)    Past Surgical History  Procedure Laterality Date  . Appendectomy    . Left heart catheterization with coronary angiogram N/A 11/07/2013    Procedure: LEFT HEART CATHETERIZATION WITH CORONARY ANGIOGRAM;  Surgeon: Clent Demark, MD;  Location: [facility] CATH LAB;  Service: Cardiovascular;  Laterality: N/A;  . Cardiac stents      No stent history   No family history on file. History  Substance Use Topics  . Smoking status: Former Smoker    Types: Cigarettes  . Smokeless tobacco: Never Used  . Alcohol Use: No    Review of Systems  Constitutional:  Negative for fever.  HENT: Positive for congestion.   Respiratory: Positive for cough and shortness of breath.   Cardiovascular: Positive for chest pain.  Gastrointestinal: Negative for abdominal pain.      Allergies  Review of patient's allergies indicates no known allergies.  Home Medications   Prior to Admission medications   Medication Sig Start Date End Date Taking? Authorizing Provider  albuterol (PROVENTIL HFA;VENTOLIN HFA) 108 (90 BASE) MCG/ACT inhaler Inhale 2 puffs into the lungs every 4 (four) hours as needed for wheezing or shortness of breath. 10/10/14   Quintella Reichert, MD  aspirin 325 MG tablet Take 650 mg by mouth every 6 (six) hours as needed.     Historical Provider, MD  atorvastatin (LIPITOR) 80 MG tablet Take 1 tablet (80 mg total) by mouth at bedtime. Patient not taking: Reported on 10/10/2014 11/08/13   Ripudeep Krystal Eaton, MD  azithromycin (ZITHROMAX) 250 MG tablet Take 1 tablet (250 mg total) by mouth daily. Take first 2 tablets together, then 1 every day until finished. 10/10/14   Quintella Reichert, MD  carvedilol (COREG) 3.125 MG tablet Take 1 tablet (3.125 mg total) by mouth 2 (two) times daily with a meal. Patient not taking: Reported on 10/10/2014 11/08/13   Ripudeep Krystal Eaton, MD  digoxin (LANOXIN) 0.125 MG tablet Take 1 tablet (0.125 mg total) by mouth daily. Patient not taking: Reported on 10/10/2014  11/08/13   Ripudeep Krystal Eaton, MD  Ipratropium-Albuterol (COMBIVENT) 20-100 MCG/ACT AERS respimat Inhale 1 puff into the lungs every 6 (six) hours as needed for wheezing or shortness of breath. 10/10/14   Quintella Reichert, MD  ipratropium-albuterol (DUONEB) 0.5-2.5 (3) MG/3ML SOLN Inhale 3 mLs into the lungs every 6 (six) hours as needed. 11/01/13   Robbie Lis, MD  nicotine (NICODERM CQ - DOSED IN MG/24 HOURS) 21 mg/24hr patch Place 1 patch (21 mg total) onto the skin daily. 11/08/13   Ripudeep Krystal Eaton, MD  predniSONE (DELTASONE) 10 MG tablet Take 4 tablets (40 mg total) by mouth daily.  10/10/14   Quintella Reichert, MD  ramipril (ALTACE) 1.25 MG capsule Take 1 capsule (1.25 mg total) by mouth daily. Patient not taking: Reported on 10/10/2014 11/08/13   Ripudeep K Rai, MD   BP 154/104 mmHg  Pulse 92  Temp(Src) 97.7 F (36.5 C) (Oral)  Resp 16  Ht 5\' 9"  (1.753 m)  Wt 114 lb (51.71 kg)  BMI 16.83 kg/m2  SpO2 99% Physical Exam  Constitutional: He is oriented to person, place, and time. He appears well-developed and well-nourished.  HENT:  Head: Normocephalic and atraumatic.  Nose: Nose normal.  Mouth/Throat: Oropharynx is clear and moist.  Eyes: Conjunctivae and EOM are normal. Pupils are equal, round, and reactive to light.  Neck: Normal range of motion. Neck supple. No JVD present. No tracheal deviation present. No thyromegaly present.  Cardiovascular: Normal rate, regular rhythm, normal heart sounds and intact distal pulses.  Exam reveals no gallop and no friction rub.   No murmur heard. Pulmonary/Chest: Effort normal. No stridor. No respiratory distress. He has no wheezes. He has no rales. He exhibits no tenderness.  Prolonged expiratory phase  Abdominal: Soft. Bowel sounds are normal. He exhibits no distension and no mass. There is no tenderness. There is no rebound and no guarding.  Musculoskeletal: Normal range of motion. He exhibits no edema or tenderness.  Lymphadenopathy:    He has no cervical adenopathy.  Neurological: He is alert and oriented to person, place, and time. He displays normal reflexes. He exhibits normal muscle tone. Coordination normal.  Skin: Skin is warm and dry. No rash noted. No erythema. No pallor.  Psychiatric: He has a normal mood and affect. His behavior is normal. Judgment and thought content normal.  Nursing note and vitals reviewed.   ED Course  Procedures (including critical care time) DIAGNOSTIC STUDIES: Oxygen Saturation is 99% on room air, normal by my interpretation.    COORDINATION OF CARE: 4:15 AM Discussed treatment plan  with patient at beside, the patient agrees with the plan and has no further questions at this time.   Labs Review Labs Reviewed  BASIC METABOLIC PANEL - Abnormal; Notable for the following:    Sodium 129 (*)    Chloride 95 (*)    Glucose, Bld 112 (*)    All other components within normal limits  CBC - Abnormal; Notable for the following:    RBC 3.85 (*)    Hemoglobin 11.9 (*)    HCT 34.9 (*)    RDW 17.9 (*)    All other components within normal limits  I-STAT TROPOININ, ED    Imaging Review Dg Chest 2 View (if Patient Has Fever And/or Copd)  10/22/2014   CLINICAL DATA:  Shortness of breath.  Left-sided chest pain tonight.  EXAM: CHEST  2 VIEW  COMPARISON:  10/10/2014  FINDINGS: Normal heart size and pulmonary vascularity. Emphysematous changes and  scattered fibrosis in the lungs. No focal airspace disease or consolidation. No blunting of costophrenic angles. No pneumothorax. No change since prior study. Mediastinal contours appear intact.  IMPRESSION: Emphysematous changes in the lungs. No active cardiopulmonary disease.   Electronically Signed   By: Lucienne Capers M.D.   On: 10/22/2014 04:07     EKG Interpretation   Date/Time:  Monday October 22 2014 03:24:04 EST Ventricular Rate:  89 PR Interval:  142 QRS Duration: 87 QT Interval:  371 QTC Calculation: 451 R Axis:   62 Text Interpretation:  Sinus rhythm Anteroseptal infarct, age indeterminate  Confirmed by Tanique Matney  MD, Dayvin Aber ([id number]) on 10/22/2014 4:13:10 AM      MDM   Final diagnoses:  COPD with acute exacerbation  Hyponatremia    I personally performed the services described in this documentation, which was scribed in my presence. The recorded information has been reviewed and is accurate.  64 year old male who has run out of his Combivent.  Arrives with complaint of shortness of breath, brief episode of cough and chest pain.  Ran out of his Combivent 2 days ago, shortness of breath started overnight.  Patient received  DuoNeb, albuterol and Solu-Medrol in route.  No wheezing on my exam.  Patient is ambulated and is maintaining his oxygen saturation.  Patient noted be hyponatremic, prior records reviewed.  It appears that he is chronically hyponatremic running in the 130s.  I will have him follow-up with Dr. Criss Rosales this week for recheck of his sodium.     Kalman Drape, MD 10/22/14 [phone]

## 2014-10-22 NOTE — ED Notes (Signed)
Pt arrives with SOb, hx of COPD and CHF. Diminished upon EMS arrival, 1 albuterol neb, 125 solumedrol, and 1 duoneb. Wheezing present now. 147/104, 85HR, 100% on 8L. 20G IV placed in L forearm.

## 2014-10-22 NOTE — ED Notes (Signed)
Pt ambulated to bathroom with pulse ox, stayed around 95% for majority of walk, lowest was 93%. Dr. Sharol Given aware.

## 2014-10-22 NOTE — ED Notes (Signed)
Pt placed in a gown with 5 lead, BP cuff and pulse ox

## 2014-10-22 NOTE — Discharge Instructions (Signed)
Chronic Obstructive Pulmonary Disease Exacerbation  Chronic obstructive pulmonary disease (COPD) is a common lung problem. In COPD, the flow of air from the lungs is limited. COPD exacerbations are times that breathing gets worse and you need extra treatment. Without treatment they can be life threatening. If they happen often, your lungs can become more damaged. HOME CARE  Do not smoke.  Avoid tobacco smoke and other things that bother your lungs.  If given, take your antibiotic medicine as told. Finish the medicine even if you start to feel better.  Only take medicines as told by your doctor.  Drink enough fluids to keep your pee (urine) clear or pale yellow (unless your doctor has told you not to).  Use a cool mist machine (vaporizer).  If you use oxygen or a machine that turns liquid medicine into a mist (nebulizer), continue to use them as told.  Keep up with shots (vaccinations) as told by your doctor.  Exercise regularly.  Eat healthy foods.  Keep all doctor visits as told. GET HELP RIGHT AWAY IF:  You are very short of breath and it gets worse.  You have trouble talking.  You have bad chest pain.  You have blood in your spit (sputum).  You have a fever.  You keep throwing up (vomiting).  You feel weak, or you pass out (faint).  You feel confused.  You keep getting worse. MAKE SURE YOU:   Understand these instructions.  Will watch your condition.  Will get help right away if you are not doing well or get worse. Document Released: 09/03/2011 Document Revised: 07/05/2013 Document Reviewed: 05/19/2013 [facility] Patient Information 2015 [place], [place]. This information is not intended to replace advice given to you by your health care provider. Make sure you discuss any questions you have with your health care provider.  Hyponatremia  Hyponatremia is when the amount of salt (sodium) in your blood is too low. When sodium levels are low, your cells will absorb  extra water and swell. The swelling happens throughout the body, but it mostly affects the brain. Severe brain swelling (cerebral edema), seizures, or coma can happen.  CAUSES   Heart, kidney, or liver problems.  Thyroid problems.  Adrenal gland problems.  Severe vomiting and diarrhea.  Certain medicines or illegal drugs.  Dehydration.  Drinking too much water.  Low-sodium diet. SYMPTOMS   Nausea and vomiting.  Confusion.  Lethargy.  Agitation.  Headache.  Twitching or shaking (seizures).  Unconsciousness.  Appetite loss.  Muscle weakness and cramping. DIAGNOSIS  Hyponatremia is identified by a simple blood test. Your caregiver will perform a history and physical exam to try to find the cause and type of hyponatremia. Other tests may be needed to measure the amount of sodium in your blood and urine. TREATMENT  Treatment will depend on the cause.   Fluids may be given through the vein (IV).  Medicines may be used to correct the sodium imbalance. If medicines are causing the problem, they will need to be adjusted.  Water or fluid intake may be restricted to restore proper balance. The speed of correcting the sodium problem is very important. If the problem is corrected too fast, nerve damage (sometimes unchangeable) can happen. HOME CARE INSTRUCTIONS   Only take medicines as directed by your caregiver. Many medicines can make hyponatremia worse. Discuss all your medicines with your caregiver.  Carefully follow any recommended diet, including any fluid restrictions.  You may be asked to repeat lab tests. Follow these directions.  Avoid alcohol and recreational drugs. SEEK MEDICAL CARE IF:   You develop worsening nausea, fatigue, headache, confusion, or weakness.  Your original hyponatremia symptoms return.  You have problems following the recommended diet. SEEK IMMEDIATE MEDICAL CARE IF:   You have a seizure.  You faint.  You have ongoing diarrhea or  vomiting. MAKE SURE YOU:   Understand these instructions.  Will watch your condition.  Will get help right away if you are not doing well or get worse. Document Released: 09/04/2002 Document Revised: 12/07/2011 Document Reviewed: 03/01/2011 [facility] Patient Information 2015 [place], [place]. This information is not intended to replace advice given to you by your health care provider. Make sure you discuss any questions you have with your health care provider.

## 2014-11-20 ENCOUNTER — Encounter (HOSPITAL_COMMUNITY): Payer: Self-pay | Admitting: Emergency Medicine

## 2014-11-20 ENCOUNTER — Emergency Department (HOSPITAL_COMMUNITY)
Admission: EM | Admit: 2014-11-20 | Discharge: 2014-11-20 | Disposition: A | Discharge status: Home / Self Care | Payer: Medicaid Other | Attending: Emergency Medicine | Admitting: Emergency Medicine

## 2014-11-20 ENCOUNTER — Emergency Department (HOSPITAL_COMMUNITY): Payer: Medicaid Other

## 2014-11-20 DIAGNOSIS — Z7952 Long term (current) use of systemic steroids: Secondary | ICD-10-CM | POA: Diagnosis not present

## 2014-11-20 DIAGNOSIS — Z79899 Other long term (current) drug therapy: Secondary | ICD-10-CM | POA: Diagnosis not present

## 2014-11-20 DIAGNOSIS — J441 Chronic obstructive pulmonary disease with (acute) exacerbation: Secondary | ICD-10-CM | POA: Diagnosis not present

## 2014-11-20 DIAGNOSIS — Z7982 Long term (current) use of aspirin: Secondary | ICD-10-CM | POA: Insufficient documentation

## 2014-11-20 DIAGNOSIS — Z792 Long term (current) use of antibiotics: Secondary | ICD-10-CM | POA: Insufficient documentation

## 2014-11-20 DIAGNOSIS — R0602 Shortness of breath: Secondary | ICD-10-CM | POA: Diagnosis present

## 2014-11-20 DIAGNOSIS — Z9861 Coronary angioplasty status: Secondary | ICD-10-CM | POA: Insufficient documentation

## 2014-11-20 DIAGNOSIS — Z87891 Personal history of nicotine dependence: Secondary | ICD-10-CM | POA: Insufficient documentation

## 2014-11-20 DIAGNOSIS — Z9889 Other specified postprocedural states: Secondary | ICD-10-CM | POA: Diagnosis not present

## 2014-11-20 LAB — CBC WITH DIFFERENTIAL/PLATELET
BASOS PCT: 0 % (ref 0–1)
Basophils Absolute: 0 10*3/uL (ref 0.0–0.1)
Eosinophils Absolute: 0.2 10*3/uL (ref 0.0–0.7)
Eosinophils Relative: 2 % (ref 0–5)
HEMATOCRIT: 35.8 % — AB (ref 39.0–52.0)
Hemoglobin: 12.1 g/dL — ABNORMAL LOW (ref 13.0–17.0)
Lymphocytes Relative: 15 % (ref 12–46)
Lymphs Abs: 1.2 10*3/uL (ref 0.7–4.0)
MCH: 32.6 pg (ref 26.0–34.0)
MCHC: 33.8 g/dL (ref 30.0–36.0)
MCV: 96.5 fL (ref 78.0–100.0)
MONO ABS: 0.9 10*3/uL (ref 0.1–1.0)
MONOS PCT: 11 % (ref 3–12)
NEUTROS PCT: 72 % (ref 43–77)
Neutro Abs: 6 10*3/uL (ref 1.7–7.7)
Platelets: 305 10*3/uL (ref 150–400)
RBC: 3.71 MIL/uL — ABNORMAL LOW (ref 4.22–5.81)
RDW: 18.3 % — ABNORMAL HIGH (ref 11.5–15.5)
WBC: 8.3 10*3/uL (ref 4.0–10.5)

## 2014-11-20 LAB — BASIC METABOLIC PANEL
Anion gap: 9 (ref 5–15)
BUN: 16 mg/dL (ref 6–23)
CALCIUM: 8.9 mg/dL (ref 8.4–10.5)
CO2: 30 mmol/L (ref 19–32)
CREATININE: 0.97 mg/dL (ref 0.50–1.35)
Chloride: 89 mmol/L — ABNORMAL LOW (ref 96–112)
GFR calc Af Amer: >90 mL/min (ref >90)
GFR, EST NON AFRICAN AMERICAN: 85 mL/min — AB (ref >90)
GLUCOSE: 117 mg/dL — AB (ref 70–99)
Potassium: 4.1 mmol/L (ref 3.5–5.1)
SODIUM: 128 mmol/L — AB (ref 135–145)

## 2014-11-20 LAB — I-STAT TROPONIN, ED: Troponin i, poc: 0.01 ng/mL (ref 0.00–0.08)

## 2014-11-20 MED ORDER — PREDNISONE 20 MG PO TABS: 60.0000 mg | ORAL_TABLET | Freq: Every day | ORAL | Status: DC

## 2014-11-20 MED ORDER — IPRATROPIUM-ALBUTEROL 0.5-2.5 (3) MG/3ML IN SOLN
3.0000 mL | Freq: Once | RESPIRATORY_TRACT | Status: AC
  Administered 2014-11-20: 3 mL via RESPIRATORY_TRACT
  Filled 2014-11-20: qty 3

## 2014-11-20 MED ORDER — ALBUTEROL SULFATE HFA 108 (90 BASE) MCG/ACT IN AERS: 2.0000 | INHALATION_SPRAY | RESPIRATORY_TRACT | Status: DC | PRN

## 2014-11-20 MED ORDER — ALBUTEROL SULFATE HFA 108 (90 BASE) MCG/ACT IN AERS
2.0000 | INHALATION_SPRAY | Freq: Once | RESPIRATORY_TRACT | Status: AC
  Administered 2014-11-20: 2 via RESPIRATORY_TRACT
  Filled 2014-11-20: qty 6.7

## 2014-11-20 NOTE — ED Provider Notes (Signed)
CSN: [id number]     Arrival date & time 11/20/14  2143 History   First MD Initiated Contact with Patient 11/20/14 2206     Chief Complaint  Patient presents with  . Shortness of Breath     (Consider location/radiation/quality/duration/timing/severity/associated sxs/prior Treatment) HPI  64 year old male with a history of COPD presents with shortness of breath and cough since a few hours prior to arrival. This feels like prior COPD exacerbations. He is also held intermittent sharp chest pain that lasts a few seconds while this is been going on. Patient states he had a prior history of a left heart catheterization in February 2015 and states stents were placed. History review shows no stents were placed at that time and he had patent coronary arteries except 25% blockage in the LAD. Patient was given 2 albuterol treatments by EMS and states he feels significant improved and almost back to his baseline. No fevers, chills. Similar to multiple prior COPD exacerbations. No leg swelling, leg pain. No chest pain now. EMS noted initial O2 of 80%, no 95% on room air.  Past Medical History  Diagnosis Date  . COPD (chronic obstructive pulmonary disease)    Past Surgical History  Procedure Laterality Date  . Appendectomy    . Left heart catheterization with coronary angiogram N/A 11/07/2013    Procedure: LEFT HEART CATHETERIZATION WITH CORONARY ANGIOGRAM;  Surgeon: Clent Demark, MD;  Location: [facility] CATH LAB;  Service: Cardiovascular;  Laterality: N/A;  . Cardiac stents      No stent history   History reviewed. No pertinent family history. History  Substance Use Topics  . Smoking status: Former Smoker    Types: Cigarettes  . Smokeless tobacco: Never Used  . Alcohol Use: No    Review of Systems  Constitutional: Negative for fever.  Respiratory: Positive for cough and shortness of breath.   Cardiovascular: Positive for chest pain. Negative for leg swelling.  Gastrointestinal: Negative for  vomiting.  All other systems reviewed and are negative.     Allergies  Review of patient's allergies indicates no known allergies.  Home Medications   Prior to Admission medications   Medication Sig Start Date End Date Taking? Authorizing Provider  albuterol (PROVENTIL HFA;VENTOLIN HFA) 108 (90 BASE) MCG/ACT inhaler Inhale 2 puffs into the lungs every 4 (four) hours as needed for wheezing or shortness of breath. 10/22/14   Kalman Drape, MD  aspirin 325 MG tablet Take 650 mg by mouth every 6 (six) hours as needed.     Historical Provider, MD  atorvastatin (LIPITOR) 80 MG tablet Take 1 tablet (80 mg total) by mouth at bedtime. Patient not taking: Reported on 10/22/2014 11/08/13   Ripudeep Krystal Eaton, MD  azithromycin (ZITHROMAX) 250 MG tablet Take 1 tablet (250 mg total) by mouth daily. Take first 2 tablets together, then 1 every day until finished. Patient not taking: Reported on 10/22/2014 10/10/14   Quintella Reichert, MD  carvedilol (COREG) 3.125 MG tablet Take 1 tablet (3.125 mg total) by mouth 2 (two) times daily with a meal. Patient not taking: Reported on 10/10/2014 11/08/13   Ripudeep Krystal Eaton, MD  digoxin (LANOXIN) 0.125 MG tablet Take 1 tablet (0.125 mg total) by mouth daily. Patient not taking: Reported on 10/10/2014 11/08/13   Ripudeep Krystal Eaton, MD  Ipratropium-Albuterol (COMBIVENT) 20-100 MCG/ACT AERS respimat Inhale 1 puff into the lungs every 6 (six) hours as needed for wheezing or shortness of breath. 10/22/14   Kalman Drape, MD  ipratropium-albuterol (DUONEB)  0.5-2.5 (3) MG/3ML SOLN Inhale 3 mLs into the lungs every 6 (six) hours as needed. 10/22/14   Kalman Drape, MD  nicotine (NICODERM CQ - DOSED IN MG/24 HOURS) 21 mg/24hr patch Place 1 patch (21 mg total) onto the skin daily. Patient not taking: Reported on 10/22/2014 11/08/13   Ripudeep Krystal Eaton, MD  predniSONE (DELTASONE) 20 MG tablet Take 3 tablets (60 mg total) by mouth daily. 10/22/14   Kalman Drape, MD  ramipril (ALTACE) 1.25 MG capsule Take 1  capsule (1.25 mg total) by mouth daily. Patient not taking: Reported on 10/10/2014 11/08/13   Ripudeep K Rai, MD   Pulse 98  Temp(Src) 97.8 F (36.6 C) (Oral)  Ht 5\' 9"  (1.753 m)  Wt 115 lb (52.164 kg)  BMI 16.97 kg/m2  SpO2 97% Physical Exam  Constitutional: He is oriented to person, place, and time. He appears well-developed and well-nourished.  Breathing through pursed lips  HENT:  Head: Normocephalic and atraumatic.  Right Ear: External ear normal.  Left Ear: External ear normal.  Nose: Nose normal.  Eyes: Right eye exhibits no discharge. Left eye exhibits no discharge.  Neck: Neck supple.  Cardiovascular: Normal rate, regular rhythm, normal heart sounds and intact distal pulses.   Pulmonary/Chest: Effort normal.  Diminished breath sounds diffusely, no focal wheezing  Abdominal: Soft. He exhibits no distension. There is no tenderness.  Musculoskeletal: He exhibits no edema.  Neurological: He is alert and oriented to person, place, and time.  Skin: Skin is warm and dry.  Nursing note and vitals reviewed.   ED Course  Procedures (including critical care time) Labs Review Labs Reviewed  CBC WITH DIFFERENTIAL/PLATELET - Abnormal; Notable for the following:    RBC 3.71 (*)    Hemoglobin 12.1 (*)    HCT 35.8 (*)    RDW 18.3 (*)    All other components within normal limits  BASIC METABOLIC PANEL - Abnormal; Notable for the following:    Sodium 128 (*)    Chloride 89 (*)    Glucose, Bld 117 (*)    GFR calc non Af Amer 85 (*)    All other components within normal limits  Randolm Idol, ED    Imaging Review Dg Chest Port 1 View  11/20/2014   CLINICAL DATA:  Acute shortness of breath, history COPD  EXAM: PORTABLE CHEST - 1 VIEW  COMPARISON:  10/22/2014  FINDINGS: Stable mild hyperinflation without focal pneumonia, collapse or consolidation. Normal heart size and vascularity. Negative for edema, effusion or pneumothorax. Trachea midline.  IMPRESSION: Stable COPD/emphysema.   No superimposed acute process.   Electronically Signed   By: Jerilynn Mages.  Shick M.D.   On: 11/20/2014 22:31     EKG Interpretation   Date/Time:  Tuesday November 20 2014 21:55:25 EST Ventricular Rate:  108 PR Interval:  137 QRS Duration: 82 QT Interval:  343 QTC Calculation: 460 R Axis:   59 Text Interpretation:  Sinus tachycardia Nonspecific T abnrm, anterolateral  leads Baseline wander in lead(s) V2 no significant change since Jan 2016  Confirmed by Regenia Skeeter  MD, [facility] [phone]) on 11/20/2014 11:07:22 PM      MDM   Final diagnoses:  COPD exacerbation    Patient with recurrent COPD exacerbation. No evidence of pneumonia. Chest pain very atypical and review of cath from one year ago shows no significant obstructive coronary disease. No prior history of stents. Has abnormal EKG but no change since 1 month ago. Given solumedrol by EMS, will discharge with albuterol  inhaler and steroid burst. On my exam patient has sats >90% while awake and has no respiratory distress. Drops to 88% while asleep, but given his longstanding COPD history this does not seem alarming and is likely  Normal. Stable for d/c home.    Ephraim Hamburger, MD 11/20/14 [phone]

## 2014-11-20 NOTE — ED Notes (Signed)
Pt A&OX4, wheeled out of ED via wheelchair, NAD, verbalizing no complaints or further questions.

## 2014-11-20 NOTE — ED Notes (Signed)
Pt reports he feels much better.

## 2014-11-20 NOTE — ED Notes (Signed)
Upon arrival patient respiratory status was clear and diminished bilaterally. Resp 16 and 97% on RA. Extremely Hard of Hearing. Stated difficulty breathing started at 1730.

## 2014-11-20 NOTE — ED Notes (Signed)
Arrived via EMS on stretcher. Reports that patient was 80% on RA with resp 36-40. Received two Albuterol Neb treatments and Resp decreased to 16-20 during second. On 8L due to Neb treatment flowing. HR100 prior to arrival. Hst of COPD, HTN and stents. EKG with artifact showing sinus rhythm.

## 2014-11-20 NOTE — ED Notes (Signed)
MD at bedside. Patient states he has been out of respiratory medication.

## 2014-12-10 ENCOUNTER — Encounter (HOSPITAL_COMMUNITY): Payer: Self-pay | Admitting: Emergency Medicine

## 2014-12-10 ENCOUNTER — Emergency Department (HOSPITAL_COMMUNITY): Payer: Medicaid Other

## 2014-12-10 ENCOUNTER — Emergency Department (HOSPITAL_COMMUNITY)
Admission: EM | Admit: 2014-12-10 | Discharge: 2014-12-11 | Disposition: A | Discharge status: Home / Self Care | Payer: Medicaid Other | Attending: Emergency Medicine | Admitting: Emergency Medicine

## 2014-12-10 DIAGNOSIS — Z87891 Personal history of nicotine dependence: Secondary | ICD-10-CM | POA: Diagnosis not present

## 2014-12-10 DIAGNOSIS — Z79899 Other long term (current) drug therapy: Secondary | ICD-10-CM | POA: Insufficient documentation

## 2014-12-10 DIAGNOSIS — Z7952 Long term (current) use of systemic steroids: Secondary | ICD-10-CM | POA: Insufficient documentation

## 2014-12-10 DIAGNOSIS — J441 Chronic obstructive pulmonary disease with (acute) exacerbation: Secondary | ICD-10-CM | POA: Insufficient documentation

## 2014-12-10 DIAGNOSIS — Z7982 Long term (current) use of aspirin: Secondary | ICD-10-CM | POA: Insufficient documentation

## 2014-12-10 DIAGNOSIS — Z8669 Personal history of other diseases of the nervous system and sense organs: Secondary | ICD-10-CM | POA: Diagnosis not present

## 2014-12-10 DIAGNOSIS — R062 Wheezing: Secondary | ICD-10-CM

## 2014-12-10 DIAGNOSIS — R0602 Shortness of breath: Secondary | ICD-10-CM | POA: Diagnosis present

## 2014-12-10 HISTORY — DX: Unspecified hearing loss, unspecified ear: H91.90

## 2014-12-10 LAB — I-STAT CHEM 8, ED
BUN: 13 mg/dL (ref 6–23)
Calcium, Ion: 0.95 mmol/L — ABNORMAL LOW (ref 1.13–1.30)
Chloride: 95 mmol/L — ABNORMAL LOW (ref 96–112)
Creatinine, Ser: 0.6 mg/dL (ref 0.50–1.35)
Glucose, Bld: 106 mg/dL — ABNORMAL HIGH (ref 70–99)
HCT: 35 % — ABNORMAL LOW (ref 39.0–52.0)
Hemoglobin: 11.9 g/dL — ABNORMAL LOW (ref 13.0–17.0)
Potassium: 5 mmol/L (ref 3.5–5.1)
Sodium: 127 mmol/L — ABNORMAL LOW (ref 135–145)
TCO2: 20 mmol/L (ref 0–100)

## 2014-12-10 MED ORDER — ALBUTEROL SULFATE (2.5 MG/3ML) 0.083% IN NEBU
5.0000 mg | INHALATION_SOLUTION | Freq: Once | RESPIRATORY_TRACT | Status: AC
  Administered 2014-12-10: 5 mg via RESPIRATORY_TRACT
  Filled 2014-12-10: qty 6

## 2014-12-10 MED ORDER — IPRATROPIUM BROMIDE 0.02 % IN SOLN
0.5000 mg | Freq: Once | RESPIRATORY_TRACT | Status: AC
  Administered 2014-12-10: 0.5 mg via RESPIRATORY_TRACT
  Filled 2014-12-10: qty 2.5

## 2014-12-10 NOTE — ED Notes (Signed)
Per EMS, patient SOB onset at lunchtime. Patient reports that he has used his inhaler at home without relief. Patient given neb tx at home, tightness throughout chest, with minimal air movement, wheezing throughout. Patient given 10mg  Albuterol neb, 0.5mg  Atroventneb, 125mg  IV solumedrol. Denies N/V/D, no chest pain, no weakness.

## 2014-12-10 NOTE — ED Notes (Signed)
PA at bedside. Patient c/o SOB, and left sided chest pain. Patient states the chest pain is intermittent and goes away on it's own. Patient states SOB started today at lunchtime, states he has COPD and is always SOB, but today it became worse. Patient states he ran out of his combivent inhaler earlier today. Patient states he has been prescribed nebs at home but has never been able to get a nebulizer machine at home. Patient states that he has "achy/throbbing" CP that started one hour ago.

## 2014-12-10 NOTE — ED Provider Notes (Signed)
CSN: [id number]     Arrival date & time 12/10/14  2229 History   First MD Initiated Contact with Patient 12/10/14 2237     Chief Complaint  Patient presents with  . Shortness of Breath     (Consider location/radiation/quality/duration/timing/severity/associated sxs/prior Treatment) HPI Comments: Patient with past medical history of COPD presents to the emergency department with chief complaint of shortness of breath and wheezing.  Patient states that the shortness breath started today around lunchtime. He states that he ran out of his inhaler earlier today. He has been prescribed embolize treatments at home, but never got a nebulizer machine. He states he feels improved after receiving a nebulizer treatment from EMS. He states that he has some chest tightness and some throbbing pain that started about one hour ago. This has improved significantly after treatment. Patient reports a mild productive cough. He denies any fevers chills. There are no aggravating factors.  The history is provided by the patient. No language interpreter was used.    Past Medical History  Diagnosis Date  . COPD (chronic obstructive pulmonary disease)   . HOH (hard of hearing)    Past Surgical History  Procedure Laterality Date  . Appendectomy    . Left heart catheterization with coronary angiogram N/A 11/07/2013    Procedure: LEFT HEART CATHETERIZATION WITH CORONARY ANGIOGRAM;  Surgeon: Clent Demark, MD;  Location: [facility] CATH LAB;  Service: Cardiovascular;  Laterality: N/A;  . Cardiac stents      No stent history   No family history on file. History  Substance Use Topics  . Smoking status: Former Smoker    Types: Cigarettes  . Smokeless tobacco: Never Used  . Alcohol Use: No    Review of Systems  Constitutional: Negative for fever and chills.  Respiratory: Positive for shortness of breath and wheezing.   Cardiovascular: Positive for chest pain.  Gastrointestinal: Negative for nausea, vomiting, diarrhea  and constipation.  Genitourinary: Negative for dysuria.  All other systems reviewed and are negative.     Allergies  Review of patient's allergies indicates no known allergies.  Home Medications   Prior to Admission medications   Medication Sig Start Date End Date Taking? Authorizing Provider  albuterol (PROVENTIL HFA;VENTOLIN HFA) 108 (90 BASE) MCG/ACT inhaler Inhale 2 puffs into the lungs every 4 (four) hours as needed for wheezing or shortness of breath. 11/20/14   Sherwood Gambler, MD  aspirin 325 MG tablet Take 650 mg by mouth every 6 (six) hours as needed.     Historical Provider, MD  atorvastatin (LIPITOR) 80 MG tablet Take 1 tablet (80 mg total) by mouth at bedtime. Patient not taking: Reported on 10/22/2014 11/08/13   Ripudeep Krystal Eaton, MD  azithromycin (ZITHROMAX) 250 MG tablet Take 1 tablet (250 mg total) by mouth daily. Take first 2 tablets together, then 1 every day until finished. Patient not taking: Reported on 10/22/2014 10/10/14   Quintella Reichert, MD  carvedilol (COREG) 3.125 MG tablet Take 1 tablet (3.125 mg total) by mouth 2 (two) times daily with a meal. Patient not taking: Reported on 10/10/2014 11/08/13   Ripudeep Krystal Eaton, MD  digoxin (LANOXIN) 0.125 MG tablet Take 1 tablet (0.125 mg total) by mouth daily. Patient not taking: Reported on 10/10/2014 11/08/13   Ripudeep Krystal Eaton, MD  Ipratropium-Albuterol (COMBIVENT) 20-100 MCG/ACT AERS respimat Inhale 1 puff into the lungs every 6 (six) hours as needed for wheezing or shortness of breath. 10/22/14   Linton Flemings, MD  ipratropium-albuterol (DUONEB) 0.5-2.5 (3)  MG/3ML SOLN Inhale 3 mLs into the lungs every 6 (six) hours as needed. 10/22/14   Linton Flemings, MD  nicotine (NICODERM CQ - DOSED IN MG/24 HOURS) 21 mg/24hr patch Place 1 patch (21 mg total) onto the skin daily. Patient not taking: Reported on 10/22/2014 11/08/13   Ripudeep Krystal Eaton, MD  predniSONE (DELTASONE) 20 MG tablet Take 3 tablets (60 mg total) by mouth daily. 11/20/14   Sherwood Gambler,  MD  ramipril (ALTACE) 1.25 MG capsule Take 1 capsule (1.25 mg total) by mouth daily. Patient not taking: Reported on 10/10/2014 11/08/13   Ripudeep K Rai, MD   BP 131/91 mmHg  Pulse 86  Temp(Src) 98 F (36.7 C) (Oral)  Resp 22  SpO2 100% Physical Exam  Constitutional: He is oriented to person, place, and time. He appears well-developed and well-nourished.  HENT:  Head: Normocephalic and atraumatic.  Eyes: Conjunctivae and EOM are normal. Pupils are equal, round, and reactive to light. Right eye exhibits no discharge. Left eye exhibits no discharge. No scleral icterus.  Neck: Normal range of motion. Neck supple. No JVD present.  Cardiovascular: Normal rate, regular rhythm and normal heart sounds.  Exam reveals no gallop and no friction rub.   No murmur heard. Pulmonary/Chest: Effort normal. No respiratory distress. He has wheezes. He has no rales. He exhibits no tenderness.  Abdominal: Soft. He exhibits no distension and no mass. There is no tenderness. There is no rebound and no guarding.  Musculoskeletal: Normal range of motion. He exhibits no edema or tenderness.  Neurological: He is alert and oriented to person, place, and time.  Skin: Skin is warm and dry.  Psychiatric: He has a normal mood and affect. His behavior is normal. Judgment and thought content normal.  Nursing note and vitals reviewed.   ED Course  Procedures (including critical care time) Labs Review Labs Reviewed  CBC WITH DIFFERENTIAL/PLATELET  I-STAT CHEM 8, ED  I-STAT TROPOININ, ED    Imaging Review No results found.   EKG Interpretation   Date/Time:  Monday December 10 2014 22:43:17 EDT Ventricular Rate:  86 PR Interval:  147 QRS Duration: 87 QT Interval:  374 QTC Calculation: 447 R Axis:   81 Text Interpretation:  Sinus rhythm Anteroseptal infarct, age indeterminate  No significant change since last tracing Confirmed by KNAPP  MD-J, JON  ([id number]) on 12/10/2014 10:56:53 PM      MDM   Final  diagnoses:  Wheeze    Patient with wheezing, shortness of breath, and reported chest tightness versus throbbing chest pain. Will check labs, x-ray, and EKG. Will give nebulizer treatment, and will reassess.  Patient feels improved with nebulizer treatments. He was also given 125 of IV Solu-Medrol. Will reassess after second nebulizer. EKG is reassuring and unchanged from prior.   12:56 AM Patient improved. He ambulates maintaining rated the 90% oxygenation. Chest x-ray is negative. Possible laboratory air on CBC, will recheck this. Patient discussed with and signed out to Dr. Claudine Mouton, who will follow-up on CBC. Plan for discharge pending normal hemoglobin.     Montine Circle, PA-C 12/11/14 [id number]  Everlene Balls, MD 12/11/14 [phone]

## 2014-12-10 NOTE — ED Notes (Signed)
Bed: WA03 Expected date:  Expected time:  Means of arrival:  Comments: EMS 64yo M. Shortness of breath

## 2014-12-10 NOTE — ED Notes (Signed)
XR at bedside

## 2014-12-11 LAB — CBC WITH DIFFERENTIAL/PLATELET
BASOS ABS: 0 10*3/uL (ref 0.0–0.1)
Basophils Relative: 1 % (ref 0–1)
Eosinophils Absolute: 0.1 10*3/uL (ref 0.0–0.7)
Eosinophils Relative: 3 % (ref 0–5)
HCT: 23.3 % — ABNORMAL LOW (ref 39.0–52.0)
Hemoglobin: 8.1 g/dL — ABNORMAL LOW (ref 13.0–17.0)
LYMPHS PCT: 8 % — AB (ref 12–46)
Lymphs Abs: 0.3 10*3/uL — ABNORMAL LOW (ref 0.7–4.0)
MCH: 34.3 pg — ABNORMAL HIGH (ref 26.0–34.0)
MCHC: 34.8 g/dL (ref 30.0–36.0)
MCV: 98.7 fL (ref 78.0–100.0)
MONOS PCT: 2 % — AB (ref 3–12)
Monocytes Absolute: 0.1 10*3/uL (ref 0.1–1.0)
NEUTROS PCT: 86 % — AB (ref 43–77)
Neutro Abs: 3.3 10*3/uL (ref 1.7–7.7)
PLATELETS: DECREASED 10*3/uL (ref 150–400)
RBC: 2.36 MIL/uL — ABNORMAL LOW (ref 4.22–5.81)
RDW: 17.6 % — AB (ref 11.5–15.5)
WBC: 3.8 10*3/uL — AB (ref 4.0–10.5)

## 2014-12-11 LAB — CBC
HCT: 35.7 % — ABNORMAL LOW (ref 39.0–52.0)
Hemoglobin: 12.1 g/dL — ABNORMAL LOW (ref 13.0–17.0)
MCH: 33.5 pg (ref 26.0–34.0)
MCHC: 33.9 g/dL (ref 30.0–36.0)
MCV: 98.9 fL (ref 78.0–100.0)
PLATELETS: 261 10*3/uL (ref 150–400)
RBC: 3.61 MIL/uL — ABNORMAL LOW (ref 4.22–5.81)
RDW: 17.2 % — ABNORMAL HIGH (ref 11.5–15.5)
WBC: 6.8 10*3/uL (ref 4.0–10.5)

## 2014-12-11 LAB — I-STAT TROPONIN, ED: Troponin i, poc: 0.01 ng/mL (ref 0.00–0.08)

## 2014-12-11 MED ORDER — ALBUTEROL SULFATE HFA 108 (90 BASE) MCG/ACT IN AERS
2.0000 | INHALATION_SPRAY | RESPIRATORY_TRACT | Status: DC | PRN
  Filled 2014-12-11: qty 6.7

## 2014-12-11 MED ORDER — PREDNISONE 20 MG PO TABS: 40.0000 mg | ORAL_TABLET | Freq: Every day | ORAL | Status: DC

## 2014-12-11 NOTE — ED Provider Notes (Signed)
Repeat CBC shows hemoglobin is 12.1. This is at the patient's baseline and consistent with where it should be. First blood draw was likely an error from the lab or during the blood draw itself. At this time patient will be discharged as per the initial plan.  Everlene Balls, MD 12/11/14 [phone]

## 2014-12-11 NOTE — ED Notes (Signed)
PA at bedside.

## 2014-12-11 NOTE — Discharge Instructions (Signed)

## 2014-12-11 NOTE — ED Notes (Signed)
Patient walked to end of hallway without difficulty. Patient's 02 sat 92%-93%. Patient stopped to catch his breath and 02 sat 94%-95%. PA  Rob made aware.

## 2014-12-14 ENCOUNTER — Telehealth: Payer: Self-pay

## 2014-12-14 NOTE — Telephone Encounter (Signed)
-----   Message from Montine Circle, PA-C sent at 12/11/2014 12:53 AM EDT ----- Regarding: Order for [facility]  Patient Name: Anthony Bolton, Anthony Bolton([id number]) Sex: Male DOB: 1951/02/25    PCP: BLAND, Sharon   Types of orders made on 12/11/2014: Consult, Lab, Medications, Nursing  Order Date:12/11/2014 Ordering Michelene Gardener [[id number]] Attending Provider:Jon Tomi Bamberger, MD [id number] Authorizing Provider: Montine Circle, PA-C [[id number]] Department:[facility]-EMERGENCY DEPT[[id number]]  Order Specific Information Order: COPD clinic follow up [Custom: [id number]]  Order #: [id number]  Qty: 1   Priority: Routine  Class: Hospital Performed     Where does the patient receive follow up COPD care -> [place]       Follow up in -> 5-7 days     Released on: 12/11/2014 12:53 AM     Priority: Routine  Class: Hospital Performed     Where does the patient receive follow up COPD care -> [place]       Follow up in -> 5-7 days     Released on: 12/11/2014 12:53 AM

## 2014-12-29 ENCOUNTER — Encounter (HOSPITAL_COMMUNITY): Payer: Self-pay | Admitting: Emergency Medicine

## 2014-12-29 ENCOUNTER — Emergency Department (HOSPITAL_COMMUNITY)
Admission: EM | Admit: 2014-12-29 | Discharge: 2014-12-29 | Disposition: A | Discharge status: Home / Self Care | Payer: Medicaid Other | Attending: Emergency Medicine | Admitting: Emergency Medicine

## 2014-12-29 ENCOUNTER — Emergency Department (HOSPITAL_COMMUNITY): Payer: Medicaid Other

## 2014-12-29 DIAGNOSIS — Z7982 Long term (current) use of aspirin: Secondary | ICD-10-CM | POA: Insufficient documentation

## 2014-12-29 DIAGNOSIS — H9190 Unspecified hearing loss, unspecified ear: Secondary | ICD-10-CM | POA: Diagnosis not present

## 2014-12-29 DIAGNOSIS — Z87891 Personal history of nicotine dependence: Secondary | ICD-10-CM | POA: Diagnosis not present

## 2014-12-29 DIAGNOSIS — Z9861 Coronary angioplasty status: Secondary | ICD-10-CM | POA: Diagnosis not present

## 2014-12-29 DIAGNOSIS — R0602 Shortness of breath: Secondary | ICD-10-CM | POA: Diagnosis present

## 2014-12-29 DIAGNOSIS — Z79899 Other long term (current) drug therapy: Secondary | ICD-10-CM | POA: Insufficient documentation

## 2014-12-29 DIAGNOSIS — Z792 Long term (current) use of antibiotics: Secondary | ICD-10-CM | POA: Insufficient documentation

## 2014-12-29 DIAGNOSIS — J441 Chronic obstructive pulmonary disease with (acute) exacerbation: Secondary | ICD-10-CM | POA: Insufficient documentation

## 2014-12-29 DIAGNOSIS — Z9889 Other specified postprocedural states: Secondary | ICD-10-CM | POA: Diagnosis not present

## 2014-12-29 LAB — CBC
HCT: 40.4 % (ref 39.0–52.0)
HEMOGLOBIN: 13.5 g/dL (ref 13.0–17.0)
MCH: 33.9 pg (ref 26.0–34.0)
MCHC: 33.4 g/dL (ref 30.0–36.0)
MCV: 101.5 fL — AB (ref 78.0–100.0)
Platelets: 202 10*3/uL (ref 150–400)
RBC: 3.98 MIL/uL — ABNORMAL LOW (ref 4.22–5.81)
RDW: 15 % (ref 11.5–15.5)
WBC: 5.2 10*3/uL (ref 4.0–10.5)

## 2014-12-29 LAB — BASIC METABOLIC PANEL
ANION GAP: 10 (ref 5–15)
BUN: 16 mg/dL (ref 6–23)
CALCIUM: 9 mg/dL (ref 8.4–10.5)
CHLORIDE: 89 mmol/L — AB (ref 96–112)
CO2: 27 mmol/L (ref 19–32)
Creatinine, Ser: 0.83 mg/dL (ref 0.50–1.35)
GFR calc Af Amer: >90 mL/min (ref >90)
GFR calc non Af Amer: >90 mL/min (ref >90)
Glucose, Bld: 87 mg/dL (ref 70–99)
Potassium: 4.3 mmol/L (ref 3.5–5.1)
Sodium: 126 mmol/L — ABNORMAL LOW (ref 135–145)

## 2014-12-29 MED ORDER — ALBUTEROL SULFATE (2.5 MG/3ML) 0.083% IN NEBU
5.0000 mg | INHALATION_SOLUTION | Freq: Once | RESPIRATORY_TRACT | Status: AC
  Administered 2014-12-29: 5 mg via RESPIRATORY_TRACT
  Filled 2014-12-29: qty 6

## 2014-12-29 MED ORDER — PREDNISONE 20 MG PO TABS
60.0000 mg | ORAL_TABLET | Freq: Once | ORAL | Status: AC
  Administered 2014-12-29: 60 mg via ORAL
  Filled 2014-12-29: qty 3

## 2014-12-29 MED ORDER — PREDNISONE 50 MG PO TABS: 50.0000 mg | ORAL_TABLET | Freq: Every day | ORAL | Status: DC

## 2014-12-29 MED ORDER — ALBUTEROL SULFATE HFA 108 (90 BASE) MCG/ACT IN AERS
2.0000 | INHALATION_SPRAY | RESPIRATORY_TRACT | Status: DC | PRN
  Administered 2014-12-29: 2 via RESPIRATORY_TRACT
  Filled 2014-12-29: qty 6.7

## 2014-12-29 MED ORDER — IPRATROPIUM-ALBUTEROL 20-100 MCG/ACT IN AERS: 1.0000 | INHALATION_SPRAY | Freq: Four times a day (QID) | RESPIRATORY_TRACT | Status: DC | PRN

## 2014-12-29 MED ORDER — IPRATROPIUM BROMIDE 0.02 % IN SOLN
0.5000 mg | Freq: Once | RESPIRATORY_TRACT | Status: AC
  Administered 2014-12-29: 0.5 mg via RESPIRATORY_TRACT
  Filled 2014-12-29: qty 2.5

## 2014-12-29 NOTE — ED Notes (Signed)
Bed: WA01 Expected date:  Expected time:  Means of arrival:  Comments: EMS-COPD, neb given

## 2014-12-29 NOTE — ED Notes (Signed)
[facility] presents with a 64 yo male from home with SOB/chest tightness and labored breathing with wheezing.  12 lead unremarkable NSR.  % mg albuterol given by [facility] en route.  Pt at 99% on aerosol mask/was 88% SpO2 at home.  VS stable at this time.  Per [facility] pt has been seen several times in last few months for same.

## 2014-12-29 NOTE — ED Notes (Signed)
Pt in xray

## 2014-12-29 NOTE — ED Provider Notes (Signed)
CSN: [id number]     Arrival date & time 12/29/14  1615 History   First MD Initiated Contact with Patient 12/29/14 1626     Chief Complaint  Patient presents with  . Shortness of Breath  . COPD     (Consider location/radiation/quality/duration/timing/severity/associated sxs/prior Treatment) HPI  Pt with hx of COPD presenting with shortness of breath and wheezing.  He arrives via EMS and his wheezing has improved.  He states that he does not have a nebulizer at home and he has run out of his albuterol inhaler.  He deneis fever/chills.  He states he has been coughing up more mucous than usual.  No leg swelling.  No vomiting or change in stools.  No abdominal pain.  Per chart review he has had several exacerbations in the past several months- it seems that he has been having difficulty getting access to his meds- states he has also run out of combivent.  He does have a PMD at [facility].  There are no other associated systemic symptoms, there are no other alleviating or modifying factors.   Past Medical History  Diagnosis Date  . COPD (chronic obstructive pulmonary disease)   . HOH (hard of hearing)    Past Surgical History  Procedure Laterality Date  . Appendectomy    . Left heart catheterization with coronary angiogram N/A 11/07/2013    Procedure: LEFT HEART CATHETERIZATION WITH CORONARY ANGIOGRAM;  Surgeon: Clent Demark, MD;  Location: [facility] CATH LAB;  Service: Cardiovascular;  Laterality: N/A;  . Cardiac stents      No stent history   History reviewed. No pertinent family history. History  Substance Use Topics  . Smoking status: Former Smoker    Types: Cigarettes  . Smokeless tobacco: Never Used  . Alcohol Use: No    Review of Systems  ROS reviewed and all otherwise negative except for mentioned in HPI    Allergies  Review of patient's allergies indicates no known allergies.  Home Medications   Prior to Admission medications   Medication Sig Start Date End Date Taking?  Authorizing Provider  albuterol (PROVENTIL HFA;VENTOLIN HFA) 108 (90 BASE) MCG/ACT inhaler Inhale 2 puffs into the lungs every 4 (four) hours as needed for wheezing or shortness of breath. 11/20/14  Yes Sherwood Gambler, MD  albuterol (PROVENTIL) (2.5 MG/3ML) 0.083% nebulizer solution Take 2.5 mg by nebulization every 6 (six) hours as needed for wheezing or shortness of breath.   Yes Historical Provider, MD  aspirin 325 MG tablet Take 650 mg by mouth every 6 (six) hours as needed for mild pain or headache.    Yes Historical Provider, MD  atorvastatin (LIPITOR) 80 MG tablet Take 1 tablet (80 mg total) by mouth at bedtime. Patient not taking: Reported on 10/22/2014 11/08/13   Ripudeep Krystal Eaton, MD  azithromycin (ZITHROMAX) 250 MG tablet Take 1 tablet (250 mg total) by mouth daily. Take first 2 tablets together, then 1 every day until finished. Patient not taking: Reported on 10/22/2014 10/10/14   Quintella Reichert, MD  carvedilol (COREG) 3.125 MG tablet Take 1 tablet (3.125 mg total) by mouth 2 (two) times daily with a meal. Patient not taking: Reported on 10/10/2014 11/08/13   Ripudeep Krystal Eaton, MD  digoxin (LANOXIN) 0.125 MG tablet Take 1 tablet (0.125 mg total) by mouth daily. Patient not taking: Reported on 10/10/2014 11/08/13   Ripudeep Krystal Eaton, MD  Ipratropium-Albuterol (COMBIVENT) 20-100 MCG/ACT AERS respimat Inhale 1 puff into the lungs every 6 (six) hours as needed  for wheezing or shortness of breath. 12/29/14   Alfonzo Beers, MD  nicotine (NICODERM CQ - DOSED IN MG/24 HOURS) 21 mg/24hr patch Place 1 patch (21 mg total) onto the skin daily. Patient not taking: Reported on 10/22/2014 11/08/13   Ripudeep Krystal Eaton, MD  predniSONE (DELTASONE) 50 MG tablet Take 1 tablet (50 mg total) by mouth daily. 12/29/14   Alfonzo Beers, MD  ramipril (ALTACE) 1.25 MG capsule Take 1 capsule (1.25 mg total) by mouth daily. Patient not taking: Reported on 10/10/2014 11/08/13   Ripudeep Krystal Eaton, MD   BP 94/72 mmHg  Pulse 81  Temp(Src) 98.1 F  (36.7 C) (Oral)  Resp 15  Wt 111 lb 12.8 oz (50.712 kg)  SpO2 92%  Vitals reviewed Physical Exam  Physical Examination: General appearance - alert, well appearing, and in no distress Mental status - alert, oriented to person, place, and time Eyes - no conjunctival injection, no scleral icterus Mouth - mucous membranes moist, pharynx normal without lesions Chest - BSS, mild expiratory wheeze bilaterally, no crackles or rhonchi, normal respiratory effort Heart - normal rate, regular rhythm, normal S1, S2, no murmurs, rubs, clicks or gallops Abdomen - soft, nontender, nondistended, no masses or organomegaly Extremities - peripheral pulses normal, no pedal edema, no clubbing or cyanosis Skin - normal coloration and turgor, no rashes  ED Course  Procedures (including critical care time) Labs Review Labs Reviewed  CBC - Abnormal; Notable for the following:    RBC 3.98 (*)    MCV 101.5 (*)    All other components within normal limits  BASIC METABOLIC PANEL - Abnormal; Notable for the following:    Sodium 126 (*)    Chloride 89 (*)    All other components within normal limits    Imaging Review Dg Chest 2 View  12/29/2014   CLINICAL DATA:  Chest tightness and shortness of breath today, initial encounter.  EXAM: CHEST  2 VIEW  COMPARISON:  12/10/2014.  FINDINGS: Trachea is midline. Heart size normal. Lungs are hyperinflated with bullous appearing lucencies in the upper hemithoraces. No airspace consolidation or pleural fluid.  IMPRESSION: Emphysema without acute finding.   Electronically Signed   By: Lorin Picket M.D.   On: 12/29/2014 18:27     EKG Interpretation   Date/Time:  Saturday December 29 2014 16:21:27 EDT Ventricular Rate:  90 PR Interval:  150 QRS Duration: 84 QT Interval:  350 QTC Calculation: 428 R Axis:   70 Text Interpretation:  Sinus rhythm Anteroseptal infarct, age indeterminate  Baseline wander No significant change since last tracing Confirmed by  [facility]  MD,  Aileene Lanum [phone]) on 12/29/2014 4:30:48 PM      MDM   Final diagnoses:  COPD exacerbation    Pt presenting with wheezing and exacerbation of his COPD.  He states he has run out of his albuterol as well as his combivent.  He states that he doesn't have a nebulizer and has asked his doctor muliptle times about this.  Pt has improvement in wheezing after treatment and he is not in significant distress.  CXR with chronic changes.   Xray images reviewed and interpreted by me as well.  Given albuterol MDI in the ED and rx for combivent as well.   Discharged with strict return precautions.  Pt agreeable with plan.     Alfonzo Beers, MD 12/29/14 [phone]

## 2014-12-29 NOTE — Discharge Instructions (Signed)
Return to the ED with any concerns including difficulty breathing despite using albuterol2 puffs  every 4 hours, not drinking fluids, decreased urine output, vomiting and not able to keep down liquids or medications, decreased level of alertness/lethargy, or any other alarming symptoms °

## 2015-01-09 ENCOUNTER — Emergency Department (HOSPITAL_COMMUNITY): Payer: Medicaid Other

## 2015-01-09 ENCOUNTER — Emergency Department (HOSPITAL_COMMUNITY)
Admission: EM | Admit: 2015-01-09 | Discharge: 2015-01-09 | Disposition: A | Discharge status: Home / Self Care | Payer: Medicaid Other | Attending: Emergency Medicine | Admitting: Emergency Medicine

## 2015-01-09 ENCOUNTER — Encounter (HOSPITAL_COMMUNITY): Payer: Self-pay | Admitting: Nurse Practitioner

## 2015-01-09 DIAGNOSIS — Z7982 Long term (current) use of aspirin: Secondary | ICD-10-CM | POA: Diagnosis not present

## 2015-01-09 DIAGNOSIS — I509 Heart failure, unspecified: Secondary | ICD-10-CM | POA: Insufficient documentation

## 2015-01-09 DIAGNOSIS — Z9861 Coronary angioplasty status: Secondary | ICD-10-CM | POA: Insufficient documentation

## 2015-01-09 DIAGNOSIS — K5900 Constipation, unspecified: Secondary | ICD-10-CM | POA: Diagnosis not present

## 2015-01-09 DIAGNOSIS — H9190 Unspecified hearing loss, unspecified ear: Secondary | ICD-10-CM | POA: Insufficient documentation

## 2015-01-09 DIAGNOSIS — Z792 Long term (current) use of antibiotics: Secondary | ICD-10-CM | POA: Diagnosis not present

## 2015-01-09 DIAGNOSIS — R0602 Shortness of breath: Secondary | ICD-10-CM

## 2015-01-09 DIAGNOSIS — Z87891 Personal history of nicotine dependence: Secondary | ICD-10-CM | POA: Insufficient documentation

## 2015-01-09 DIAGNOSIS — J441 Chronic obstructive pulmonary disease with (acute) exacerbation: Secondary | ICD-10-CM

## 2015-01-09 DIAGNOSIS — Z7952 Long term (current) use of systemic steroids: Secondary | ICD-10-CM | POA: Insufficient documentation

## 2015-01-09 DIAGNOSIS — Z9889 Other specified postprocedural states: Secondary | ICD-10-CM | POA: Insufficient documentation

## 2015-01-09 DIAGNOSIS — I2510 Atherosclerotic heart disease of native coronary artery without angina pectoris: Secondary | ICD-10-CM | POA: Insufficient documentation

## 2015-01-09 DIAGNOSIS — Z79899 Other long term (current) drug therapy: Secondary | ICD-10-CM | POA: Insufficient documentation

## 2015-01-09 LAB — COMPREHENSIVE METABOLIC PANEL
ALBUMIN: 4.2 g/dL (ref 3.5–5.2)
ALT: 15 U/L (ref 0–53)
ANION GAP: 11 (ref 5–15)
AST: 17 U/L (ref 0–37)
Alkaline Phosphatase: 53 U/L (ref 39–117)
BUN: 18 mg/dL (ref 6–23)
CHLORIDE: 92 mmol/L — AB (ref 96–112)
CO2: 26 mmol/L (ref 19–32)
CREATININE: 1.31 mg/dL (ref 0.50–1.35)
Calcium: 8.8 mg/dL (ref 8.4–10.5)
GFR calc Af Amer: 65 mL/min — ABNORMAL LOW (ref >90)
GFR calc non Af Amer: 56 mL/min — ABNORMAL LOW (ref >90)
Glucose, Bld: 114 mg/dL — ABNORMAL HIGH (ref 70–99)
Potassium: 3.4 mmol/L — ABNORMAL LOW (ref 3.5–5.1)
Sodium: 129 mmol/L — ABNORMAL LOW (ref 135–145)
TOTAL PROTEIN: 6.6 g/dL (ref 6.0–8.3)
Total Bilirubin: 0.5 mg/dL (ref 0.3–1.2)

## 2015-01-09 LAB — CBC WITH DIFFERENTIAL/PLATELET
Basophils Absolute: 0 10*3/uL (ref 0.0–0.1)
Basophils Relative: 1 % (ref 0–1)
EOS ABS: 0.2 10*3/uL (ref 0.0–0.7)
Eosinophils Relative: 5 % (ref 0–5)
HCT: 39.4 % (ref 39.0–52.0)
HEMOGLOBIN: 13.4 g/dL (ref 13.0–17.0)
LYMPHS PCT: 27 % (ref 12–46)
Lymphs Abs: 1.2 10*3/uL (ref 0.7–4.0)
MCH: 34.4 pg — AB (ref 26.0–34.0)
MCHC: 34 g/dL (ref 30.0–36.0)
MCV: 101.3 fL — AB (ref 78.0–100.0)
MONOS PCT: 11 % (ref 3–12)
Monocytes Absolute: 0.5 10*3/uL (ref 0.1–1.0)
Neutro Abs: 2.4 10*3/uL (ref 1.7–7.7)
Neutrophils Relative %: 56 % (ref 43–77)
Platelets: 204 10*3/uL (ref 150–400)
RBC: 3.89 MIL/uL — AB (ref 4.22–5.81)
RDW: 13.5 % (ref 11.5–15.5)
WBC: 4.4 10*3/uL (ref 4.0–10.5)

## 2015-01-09 LAB — I-STAT TROPONIN, ED: TROPONIN I, POC: 0.01 ng/mL (ref 0.00–0.08)

## 2015-01-09 LAB — BRAIN NATRIURETIC PEPTIDE: B Natriuretic Peptide: 86.2 pg/mL (ref 0.0–100.0)

## 2015-01-09 LAB — DIGOXIN LEVEL

## 2015-01-09 MED ORDER — PREDNISONE 20 MG PO TABS: ORAL_TABLET | ORAL | Status: AC

## 2015-01-09 MED ORDER — ALBUTEROL SULFATE (2.5 MG/3ML) 0.083% IN NEBU: 2.5000 mg | INHALATION_SOLUTION | RESPIRATORY_TRACT | Status: DC | PRN

## 2015-01-09 MED ORDER — POLYETHYLENE GLYCOL 3350 17 G PO PACK: 17.0000 g | PACK | Freq: Every day | ORAL | Status: DC

## 2015-01-09 MED ORDER — ALBUTEROL SULFATE HFA 108 (90 BASE) MCG/ACT IN AERS: 2.0000 | INHALATION_SPRAY | Freq: Four times a day (QID) | RESPIRATORY_TRACT | Status: DC | PRN

## 2015-01-09 MED ORDER — ALBUTEROL SULFATE (2.5 MG/3ML) 0.083% IN NEBU
5.0000 mg | INHALATION_SOLUTION | Freq: Once | RESPIRATORY_TRACT | Status: AC
  Administered 2015-01-09: 5 mg via RESPIRATORY_TRACT
  Filled 2015-01-09: qty 6

## 2015-01-09 NOTE — ED Notes (Signed)
Pt alert, oriented, and ambulatory upon DC. He was given a bus pass to get back home.

## 2015-01-09 NOTE — ED Provider Notes (Signed)
CSN: [id number]     Arrival date & time 01/09/15  1523 History   First MD Initiated Contact with Patient 01/09/15 1529     Chief Complaint  Patient presents with  . Shortness of Breath    Hx of COPD     (Consider location/radiation/quality/duration/timing/severity/associated sxs/prior Treatment) Patient is a 64 y.o. male presenting with shortness of breath. The history is provided by the patient.  Shortness of Breath Severity:  Mild Onset quality:  Gradual Duration:  1 day Timing:  Constant Progression:  Worsening Chronicity:  Recurrent Context: URI   Relieved by:  Nothing Worsened by:  Nothing tried Ineffective treatments:  None tried Associated symptoms: cough   Associated symptoms: no abdominal pain, no fever, no headaches, no neck pain and no vomiting   Cough:    Cough characteristics:  Non-productive   Past Medical History  Diagnosis Date  . COPD (chronic obstructive pulmonary disease)   . HOH (hard of hearing)    Past Surgical History  Procedure Laterality Date  . Appendectomy    . Left heart catheterization with coronary angiogram N/A 11/07/2013    Procedure: LEFT HEART CATHETERIZATION WITH CORONARY ANGIOGRAM;  Surgeon: Clent Demark, MD;  Location: [facility] CATH LAB;  Service: Cardiovascular;  Laterality: N/A;  . Cardiac stents      No stent history   History reviewed. No pertinent family history. History  Substance Use Topics  . Smoking status: Former Smoker    Types: Cigarettes  . Smokeless tobacco: Never Used  . Alcohol Use: No    Review of Systems  Constitutional: Negative for fever.  HENT: Negative for drooling and rhinorrhea.   Eyes: Negative for pain.  Respiratory: Positive for cough, chest tightness and shortness of breath.   Cardiovascular: Negative for leg swelling.  Gastrointestinal: Negative for nausea, vomiting, abdominal pain and diarrhea.  Genitourinary: Negative for dysuria and hematuria.  Musculoskeletal: Negative for gait problem and  neck pain.  Skin: Negative for color change.  Neurological: Negative for numbness and headaches.  Hematological: Negative for adenopathy.  Psychiatric/Behavioral: Negative for behavioral problems.  All other systems reviewed and are negative.     Allergies  Review of patient's allergies indicates no known allergies.  Home Medications   Prior to Admission medications   Medication Sig Start Date End Date Taking? Authorizing Provider  albuterol (PROVENTIL HFA;VENTOLIN HFA) 108 (90 BASE) MCG/ACT inhaler Inhale 2 puffs into the lungs every 4 (four) hours as needed for wheezing or shortness of breath. 11/20/14   Sherwood Gambler, MD  albuterol (PROVENTIL) (2.5 MG/3ML) 0.083% nebulizer solution Take 2.5 mg by nebulization every 6 (six) hours as needed for wheezing or shortness of breath.    Historical Provider, MD  aspirin 325 MG tablet Take 650 mg by mouth every 6 (six) hours as needed for mild pain or headache.     Historical Provider, MD  atorvastatin (LIPITOR) 80 MG tablet Take 1 tablet (80 mg total) by mouth at bedtime. Patient not taking: Reported on 10/22/2014 11/08/13   Ripudeep Krystal Eaton, MD  azithromycin (ZITHROMAX) 250 MG tablet Take 1 tablet (250 mg total) by mouth daily. Take first 2 tablets together, then 1 every day until finished. Patient not taking: Reported on 10/22/2014 10/10/14   Quintella Reichert, MD  carvedilol (COREG) 3.125 MG tablet Take 1 tablet (3.125 mg total) by mouth 2 (two) times daily with a meal. Patient not taking: Reported on 10/10/2014 11/08/13   Ripudeep Krystal Eaton, MD  digoxin (LANOXIN) 0.125 MG tablet Take  1 tablet (0.125 mg total) by mouth daily. Patient not taking: Reported on 10/10/2014 11/08/13   Ripudeep Krystal Eaton, MD  Ipratropium-Albuterol (COMBIVENT) 20-100 MCG/ACT AERS respimat Inhale 1 puff into the lungs every 6 (six) hours as needed for wheezing or shortness of breath. 12/29/14   Alfonzo Beers, MD  nicotine (NICODERM CQ - DOSED IN MG/24 HOURS) 21 mg/24hr patch Place 1 patch  (21 mg total) onto the skin daily. Patient not taking: Reported on 10/22/2014 11/08/13   Ripudeep Krystal Eaton, MD  predniSONE (DELTASONE) 50 MG tablet Take 1 tablet (50 mg total) by mouth daily. 12/29/14   Alfonzo Beers, MD  ramipril (ALTACE) 1.25 MG capsule Take 1 capsule (1.25 mg total) by mouth daily. Patient not taking: Reported on 10/10/2014 11/08/13   Ripudeep K Rai, MD   BP 132/80 mmHg  Pulse 72  Temp(Src) 97.6 F (36.4 C) (Oral)  Resp 20  SpO2 100% Physical Exam  Constitutional: He is oriented to person, place, and time. He appears well-developed and well-nourished.  HENT:  Head: Normocephalic and atraumatic.  Right Ear: External ear normal.  Left Ear: External ear normal.  Nose: Nose normal.  Mouth/Throat: Oropharynx is clear and moist. No oropharyngeal exudate.  Eyes: Conjunctivae and EOM are normal. Pupils are equal, round, and reactive to light.  Neck: Normal range of motion. Neck supple.  Cardiovascular: Normal rate, regular rhythm, normal heart sounds and intact distal pulses.  Exam reveals no gallop and no friction rub.   No murmur heard. Pulmonary/Chest: Effort normal. No respiratory distress. He has wheezes (mild wheezing with prolonged  expiratory phase.).  Abdominal: Soft. Bowel sounds are normal. He exhibits no distension. There is no tenderness. There is no rebound and no guarding.  Musculoskeletal: Normal range of motion. He exhibits no edema or tenderness.  Neurological: He is alert and oriented to person, place, and time.  Skin: Skin is warm and dry.  Psychiatric: He has a normal mood and affect. His behavior is normal.  Nursing note and vitals reviewed.   ED Course  Procedures (including critical care time) Labs Review Labs Reviewed  CBC WITH DIFFERENTIAL/PLATELET - Abnormal; Notable for the following:    RBC 3.89 (*)    MCV 101.3 (*)    MCH 34.4 (*)    All other components within normal limits  COMPREHENSIVE METABOLIC PANEL - Abnormal; Notable for the  following:    Sodium 129 (*)    Potassium 3.4 (*)    Chloride 92 (*)    Glucose, Bld 114 (*)    GFR calc non Af Amer 56 (*)    GFR calc Af Amer 65 (*)    All other components within normal limits  DIGOXIN LEVEL - Abnormal; Notable for the following:    Digoxin Level <0.2 (*)    All other components within normal limits  BRAIN NATRIURETIC PEPTIDE  I-STAT TROPOININ, ED    Imaging Review Dg Chest 2 View  01/09/2015   CLINICAL DATA:  64 year old male with acute shortness of Breath today. Initial encounter.  EXAM: CHEST  2 VIEW  COMPARISON:  12/29/2014 and earlier.  FINDINGS: Semi upright AP and lateral views of the chest. Stable chronically large lung volumes. Normal cardiac size and mediastinal contours. No pneumothorax, pulmonary edema, pleural effusion or acute pulmonary opacity. Osteopenia. No acute osseous abnormality identified. Visualized tracheal air column is within normal limits.  IMPRESSION: Chronic emphysema.  No acute cardiopulmonary abnormality.   Electronically Signed   By: Herminio Heads.D.  On: 01/09/2015 16:22     EKG Interpretation   Date/Time:  Wednesday January 09 2015 15:30:34 EDT Ventricular Rate:  71 PR Interval:  170 QRS Duration: 94 QT Interval:  419 QTC Calculation: 455 R Axis:   74 Text Interpretation:  Sinus rhythm Anteroseptal infarct, age indeterminate  Baseline wander in lead(s) I III aVR aVL No significant change since last  tracing Confirmed by Mariabella Nilsen  MD, Laraina Sulton ([id number]) on 01/09/2015 3:36:04 PM      MDM   Final diagnoses:  SOB (shortness of breath)  COPD exacerbation  Constipation, unspecified constipation type    3:34 PM 64 y.o. male w hx of copd, chf on digoxin, CAD who presents with shortness of breath, dry cough, and chest tightness which began yesterday. He denies any fevers. He states that he has an inhaler that he uses but it ran out of medicine. EMS has given him an albuterol treatment and under 125 mg Solu-Medrol throughout. He is  afebrile and vital signs are unremarkable here. He appears comfortable on exam. We'll get screening labs, imaging, and albuterol treatment.  Heart cath in Feb 2015 shows LAD has 20-25% mid stenosis.No other significant coronary artery disease. He was treated medically.  Care management has seen the patient and arranged him to get a nebulizer for home.  5:56 PM: I interpreted/reviewed the labs and/or imaging which were non-contributory.  Pt cont to appear well. He notes some contip over the last few days. He did have a small bowel movement last night. He has not tried anything to improve his constipation. We'll recommend Maalox. Will place on steroids and give Rx for inhaler.  I have discussed the diagnosis/risks/treatment options with the patient and believe the pt to be eligible for discharge home to follow-up with his pcp. We also discussed returning to the ED immediately if new or worsening sx occur. We discussed the sx which are most concerning (e.g., worsening sob) that necessitate immediate return. Medications administered to the patient during their visit and any new prescriptions provided to the patient are listed below.  Medications given during this visit Medications  albuterol (PROVENTIL) (2.5 MG/3ML) 0.083% nebulizer solution 5 mg (5 mg Nebulization Given 01/09/15 1543)    New Prescriptions   ALBUTEROL (PROVENTIL HFA;VENTOLIN HFA) 108 (90 BASE) MCG/ACT INHALER    Inhale 2 puffs into the lungs every 6 (six) hours as needed for wheezing or shortness of breath.   ALBUTEROL (PROVENTIL) (2.5 MG/3ML) 0.083% NEBULIZER SOLUTION    Take 3 mLs (2.5 mg total) by nebulization every 4 (four) hours as needed for wheezing or shortness of breath.   POLYETHYLENE GLYCOL (MIRALAX / GLYCOLAX) PACKET    Take 17 g by mouth daily.   PREDNISONE (DELTASONE) 20 MG TABLET    Take 2 tablets by mouth on day 1 and day 2. Take 1 tablet by mouth on day 3.     Pamella Pert, MD 01/09/15 1757

## 2015-01-09 NOTE — ED Notes (Signed)
Pt presents from home, medics report pt called to report and exacerbation of his COPD, sats on RA 93%, denies chest pain, received 0.5 mg Atrovent, 5mg  Albuterol, 125 mg Solumedrol IV en route from medics.

## 2015-01-09 NOTE — ED Notes (Signed)
MD Harrison at bedside. 

## 2015-01-09 NOTE — Progress Notes (Signed)
ED CM consulted by ED RN, Ivar Drape Requesting DME nebulizer for home Cm spoke with COPD pt.  [facility] for [facility]. Pt hard of hearing but very appreciative of servicess rendered CM spoke with [place] [facility] at 1549 and provided referral for DME and discussed delivery at home  EDP Aline Brochure updated at 1603 Cm signing off

## 2015-01-09 NOTE — ED Notes (Signed)
Pt was advised to follow up with PCP. He was advised to get prescriptions filled. He requests that we give them to him now and not prescriptions.

## 2015-01-14 ENCOUNTER — Encounter (HOSPITAL_COMMUNITY): Payer: Self-pay | Admitting: *Deleted

## 2015-01-14 ENCOUNTER — Emergency Department (HOSPITAL_COMMUNITY)
Admission: EM | Admit: 2015-01-14 | Discharge: 2015-01-14 | Disposition: A | Discharge status: Home / Self Care | Payer: Medicaid Other | Attending: Emergency Medicine | Admitting: Emergency Medicine

## 2015-01-14 ENCOUNTER — Emergency Department (HOSPITAL_COMMUNITY): Payer: Medicaid Other

## 2015-01-14 DIAGNOSIS — Z87891 Personal history of nicotine dependence: Secondary | ICD-10-CM | POA: Insufficient documentation

## 2015-01-14 DIAGNOSIS — Z79899 Other long term (current) drug therapy: Secondary | ICD-10-CM | POA: Diagnosis not present

## 2015-01-14 DIAGNOSIS — R079 Chest pain, unspecified: Secondary | ICD-10-CM | POA: Diagnosis present

## 2015-01-14 DIAGNOSIS — J441 Chronic obstructive pulmonary disease with (acute) exacerbation: Secondary | ICD-10-CM | POA: Diagnosis not present

## 2015-01-14 DIAGNOSIS — Z792 Long term (current) use of antibiotics: Secondary | ICD-10-CM | POA: Diagnosis not present

## 2015-01-14 DIAGNOSIS — Z7982 Long term (current) use of aspirin: Secondary | ICD-10-CM | POA: Insufficient documentation

## 2015-01-14 DIAGNOSIS — H9190 Unspecified hearing loss, unspecified ear: Secondary | ICD-10-CM | POA: Insufficient documentation

## 2015-01-14 LAB — I-STAT CHEM 8, ED
BUN: 21 mg/dL (ref 6–23)
CALCIUM ION: 1.13 mmol/L (ref 1.13–1.30)
Chloride: 89 mmol/L — ABNORMAL LOW (ref 96–112)
Creatinine, Ser: 1.1 mg/dL (ref 0.50–1.35)
GLUCOSE: 114 mg/dL — AB (ref 70–99)
HCT: 43 % (ref 39.0–52.0)
Hemoglobin: 14.6 g/dL (ref 13.0–17.0)
Potassium: 2.9 mmol/L — ABNORMAL LOW (ref 3.5–5.1)
Sodium: 128 mmol/L — ABNORMAL LOW (ref 135–145)
TCO2: 25 mmol/L (ref 0–100)

## 2015-01-14 LAB — I-STAT TROPONIN, ED: Troponin i, poc: 0.01 ng/mL (ref 0.00–0.08)

## 2015-01-14 LAB — CBC WITH DIFFERENTIAL/PLATELET
BASOS ABS: 0 10*3/uL (ref 0.0–0.1)
BASOS PCT: 0 % (ref 0–1)
EOS ABS: 0.3 10*3/uL (ref 0.0–0.7)
EOS PCT: 4 % (ref 0–5)
HEMATOCRIT: 38 % — AB (ref 39.0–52.0)
Hemoglobin: 13 g/dL (ref 13.0–17.0)
LYMPHS PCT: 30 % (ref 12–46)
Lymphs Abs: 1.8 10*3/uL (ref 0.7–4.0)
MCH: 34.7 pg — ABNORMAL HIGH (ref 26.0–34.0)
MCHC: 34.2 g/dL (ref 30.0–36.0)
MCV: 101.3 fL — ABNORMAL HIGH (ref 78.0–100.0)
MONO ABS: 0.6 10*3/uL (ref 0.1–1.0)
Monocytes Relative: 10 % (ref 3–12)
Neutro Abs: 3.2 10*3/uL (ref 1.7–7.7)
Neutrophils Relative %: 56 % (ref 43–77)
PLATELETS: 191 10*3/uL (ref 150–400)
RBC: 3.75 MIL/uL — AB (ref 4.22–5.81)
RDW: 13.5 % (ref 11.5–15.5)
WBC: 5.9 10*3/uL (ref 4.0–10.5)

## 2015-01-14 MED ORDER — PREDNISONE 50 MG PO TABS: 50.0000 mg | ORAL_TABLET | Freq: Every day | ORAL | Status: DC

## 2015-01-14 MED ORDER — PREDNISONE 20 MG PO TABS
60.0000 mg | ORAL_TABLET | Freq: Once | ORAL | Status: AC
  Administered 2015-01-14: 60 mg via ORAL
  Filled 2015-01-14: qty 3

## 2015-01-14 MED ORDER — IPRATROPIUM BROMIDE 0.02 % IN SOLN
0.5000 mg | Freq: Once | RESPIRATORY_TRACT | Status: AC
  Administered 2015-01-14: 0.5 mg via RESPIRATORY_TRACT
  Filled 2015-01-14: qty 2.5

## 2015-01-14 MED ORDER — ALBUTEROL (5 MG/ML) CONTINUOUS INHALATION SOLN
10.0000 mg/h | INHALATION_SOLUTION | Freq: Once | RESPIRATORY_TRACT | Status: AC
  Administered 2015-01-14: 10 mg/h via RESPIRATORY_TRACT
  Filled 2015-01-14: qty 20

## 2015-01-14 MED ORDER — POTASSIUM CHLORIDE CRYS ER 20 MEQ PO TBCR
60.0000 meq | EXTENDED_RELEASE_TABLET | Freq: Once | ORAL | Status: AC
  Administered 2015-01-14: 60 meq via ORAL
  Filled 2015-01-14: qty 3

## 2015-01-14 MED ORDER — POTASSIUM CHLORIDE CRYS ER 20 MEQ PO TBCR
40.0000 meq | EXTENDED_RELEASE_TABLET | Freq: Once | ORAL | Status: AC
  Administered 2015-01-14: 40 meq via ORAL
  Filled 2015-01-14: qty 2

## 2015-01-14 NOTE — ED Provider Notes (Signed)
CSN: [id number]     Arrival date & time 01/14/15  0867 History   First MD Initiated Contact with Patient 01/14/15 0335     This chart was scribed for No att. providers found by Forrestine Him, ED Scribe. This patient was seen in room D31C/D31C and the patient's care was started 3:47 AM.   Chief Complaint  Patient presents with  . Chest Pain  . Shortness of Breath   The history is provided by the patient. No language interpreter was used.    HPI Comments: Anthony Bolton brought in by EMS is a 64 y.o. male with a PMHx of COPD who presents to the Emergency Department complaining of constant, ongoing, unchanged shortness of breath x 1 day. Pt also reports chest tightness, dry cough, and wheezing. Albuterol and 125 Solumedrol given prior to arrival. Pt states he also took 2 doses of Aspirin before coming to department. No recent fever or chills. No known allergies to medications.  Past Medical History  Diagnosis Date  . COPD (chronic obstructive pulmonary disease)   . HOH (hard of hearing)    Past Surgical History  Procedure Laterality Date  . Appendectomy    . Left heart catheterization with coronary angiogram N/A 11/07/2013    Procedure: LEFT HEART CATHETERIZATION WITH CORONARY ANGIOGRAM;  Surgeon: Clent Demark, MD;  Location: [facility] CATH LAB;  Service: Cardiovascular;  Laterality: N/A;  . Cardiac stents      No stent history   History reviewed. No pertinent family history. History  Substance Use Topics  . Smoking status: Former Smoker    Types: Cigarettes  . Smokeless tobacco: Never Used  . Alcohol Use: No    Review of Systems  Constitutional: Negative for fever and chills.  Respiratory: Positive for cough, chest tightness and shortness of breath.   Skin: Negative for rash.  Psychiatric/Behavioral: Negative for confusion.      Allergies  Review of patient's allergies indicates no known allergies.  Home Medications   Prior to Admission medications   Medication Sig Start  Date End Date Taking? Authorizing Provider  albuterol (PROVENTIL HFA;VENTOLIN HFA) 108 (90 BASE) MCG/ACT inhaler Inhale 2 puffs into the lungs every 6 (six) hours as needed for wheezing or shortness of breath. 01/09/15  Yes Pamella Pert, MD  aspirin 325 MG tablet Take 650 mg by mouth every 6 (six) hours as needed for mild pain or headache.    Yes Historical Provider, MD  Ipratropium-Albuterol (COMBIVENT) 20-100 MCG/ACT AERS respimat Inhale 1 puff into the lungs every 6 (six) hours as needed for wheezing or shortness of breath. 12/29/14  Yes Alfonzo Beers, MD  Melatonin 1 MG CAPS Take 2 capsules by mouth at bedtime.    Yes Historical Provider, MD  traZODone (DESYREL) 50 MG tablet Take 50 mg by mouth at bedtime.   Yes Historical Provider, MD  albuterol (PROVENTIL HFA;VENTOLIN HFA) 108 (90 BASE) MCG/ACT inhaler Inhale 2 puffs into the lungs every 4 (four) hours as needed for wheezing or shortness of breath. Patient not taking: Reported on 01/14/2015 11/20/14   Sherwood Gambler, MD  albuterol (PROVENTIL) (2.5 MG/3ML) 0.083% nebulizer solution Take 2.5 mg by nebulization every 6 (six) hours as needed for wheezing or shortness of breath.    Historical Provider, MD  albuterol (PROVENTIL) (2.5 MG/3ML) 0.083% nebulizer solution Take 3 mLs (2.5 mg total) by nebulization every 4 (four) hours as needed for wheezing or shortness of breath. Patient not taking: Reported on 01/14/2015 01/09/15   Pamella Pert, MD  atorvastatin (LIPITOR) 80 MG tablet Take 1 tablet (80 mg total) by mouth at bedtime. Patient not taking: Reported on 10/22/2014 11/08/13   Ripudeep Krystal Eaton, MD  azithromycin (ZITHROMAX) 250 MG tablet Take 1 tablet (250 mg total) by mouth daily. Take first 2 tablets together, then 1 every day until finished. Patient not taking: Reported on 10/22/2014 10/10/14   Quintella Reichert, MD  carvedilol (COREG) 3.125 MG tablet Take 1 tablet (3.125 mg total) by mouth 2 (two) times daily with a meal. Patient not taking: Reported  on 10/10/2014 11/08/13   Ripudeep Krystal Eaton, MD  digoxin (LANOXIN) 0.125 MG tablet Take 1 tablet (0.125 mg total) by mouth daily. Patient not taking: Reported on 10/10/2014 11/08/13   Ripudeep Krystal Eaton, MD  nicotine (NICODERM CQ - DOSED IN MG/24 HOURS) 21 mg/24hr patch Place 1 patch (21 mg total) onto the skin daily. Patient not taking: Reported on 10/22/2014 11/08/13   Ripudeep Krystal Eaton, MD  polyethylene glycol (MIRALAX / GLYCOLAX) packet Take 17 g by mouth daily. Patient not taking: Reported on 01/14/2015 01/09/15   Pamella Pert, MD  predniSONE (DELTASONE) 50 MG tablet Take 1 tablet (50 mg total) by mouth daily. 01/14/15   Varney Biles, MD  ramipril (ALTACE) 1.25 MG capsule Take 1 capsule (1.25 mg total) by mouth daily. Patient not taking: Reported on 10/10/2014 11/08/13   Ripudeep Krystal Eaton, MD   Triage Vitals: BP 115/76 mmHg  Pulse 91  Temp(Src) 97.5 F (36.4 C) (Oral)  Resp 18  Ht 5\' 10"  (1.778 m)  Wt 111 lb (50.349 kg)  BMI 15.93 kg/m2  SpO2 98%   Physical Exam  Constitutional: He is oriented to person, place, and time. He appears well-developed and well-nourished.  HENT:  Head: Normocephalic and atraumatic.  Eyes: EOM are normal.  Neck: Normal range of motion.  Cardiovascular: Normal rate, regular rhythm, normal heart sounds and intact distal pulses.   2 plus and equal radial pulse  Pulmonary/Chest: No respiratory distress. He has no wheezes.  Chest appears tight with poor air movement No wheezing appreciated  Abdominal: Soft. He exhibits no distension. There is no tenderness.  Musculoskeletal: Normal range of motion.  No pitting edema No leg swelling  Neurological: He is alert and oriented to person, place, and time.  Skin: Skin is warm and dry.  Psychiatric: He has a normal mood and affect. Judgment normal.  Nursing note and vitals reviewed.   ED Course  Procedures (including critical care time)  DIAGNOSTIC STUDIES: Oxygen Saturation is 100% on RA, Normal by my interpretation.     COORDINATION OF CARE: 3:46 AM-Discussed treatment plan with pt at bedside and pt agreed to plan.     Labs Review Labs Reviewed  CBC WITH DIFFERENTIAL/PLATELET - Abnormal; Notable for the following:    RBC 3.75 (*)    HCT 38.0 (*)    MCV 101.3 (*)    MCH 34.7 (*)    All other components within normal limits  I-STAT CHEM 8, ED - Abnormal; Notable for the following:    Sodium 128 (*)    Potassium 2.9 (*)    Chloride 89 (*)    Glucose, Bld 114 (*)    All other components within normal limits  I-STAT TROPOININ, ED    Imaging Review No results found.   EKG Interpretation   Date/Time:  Monday January 14 2015 03:41:07 EDT Ventricular Rate:  77 PR Interval:  145 QRS Duration: 101 QT Interval:  408 QTC Calculation: 462 R Axis:  77 Text Interpretation:  Sinus rhythm Anteroseptal infarct, age indeterminate  No significant change since last tracing Confirmed by Kathrynn Humble, MD, Thelma Comp  [phone]) on 01/14/2015 4:29:50 AM      MDM   Final diagnoses:  Chest pain  COPD exacerbation    PT comes in with cc of chest pain and copd. He has hx of COPD, CHF. EMS was called for DIB, and patient was wheezing - and is s/p breathing tx. His lung exam now reveals no wheezing, but he has poor air movement and a tight chest. Will give more treatments. Pt has some left sided chest discomfort. Heart cath in Feb 2015 shows LAD has 20-25% mid stenosis.No other significant coronary artery disease. Doubt ACS and troponin is neg. PT has hypoK, will give oral K, no ekg abnormalities associated with the low K. PT will get more tx, reassessed, and if doing well, ambulated to ensure he is stable for discharge.   Varney Biles, MD 01/16/15 [phone]

## 2015-01-14 NOTE — Discharge Instructions (Signed)
We saw you in the ER for your COPD related complains. We gave you some breathing treatments in the ER, and seems like your symptoms have improved. Please take albuterol as needed every 4 hours. Please take the medications prescribed. Please refrain from smoking or smoke exposure. Please see a primary care doctor in 1 week. Return to the ER if your symptoms worsen.  Chronic Obstructive Pulmonary Disease Chronic obstructive pulmonary disease (COPD) is a common lung condition in which airflow from the lungs is limited. COPD is a general term that can be used to describe many different lung problems that limit airflow, including both chronic bronchitis and emphysema. If you have COPD, your lung function will probably never return to normal, but there are measures you can take to improve lung function and make yourself feel better.  CAUSES   Smoking (common).   Exposure to secondhand smoke.   Genetic problems.  Chronic inflammatory lung diseases or recurrent infections. SYMPTOMS   Shortness of breath, especially with physical activity.   Deep, persistent (chronic) cough with a large amount of thick mucus.   Wheezing.   Rapid breaths (tachypnea).   Gray or bluish discoloration (cyanosis) of the skin, especially in fingers, toes, or lips.   Fatigue.   Weight loss.   Frequent infections or episodes when breathing symptoms become much worse (exacerbations).   Chest tightness. DIAGNOSIS  Your health care provider will take a medical history and perform a physical examination to make the initial diagnosis. Additional tests for COPD may include:   Lung (pulmonary) function tests.  Chest X-ray.  CT scan.  Blood tests. TREATMENT  Treatment available to help you feel better when you have COPD includes:   Inhaler and nebulizer medicines. These help manage the symptoms of COPD and make your breathing more comfortable.  Supplemental oxygen. Supplemental oxygen is only  helpful if you have a low oxygen level in your blood.   Exercise and physical activity. These are beneficial for nearly all people with COPD. Some people may also benefit from a pulmonary rehabilitation program. HOME CARE INSTRUCTIONS   Take all medicines (inhaled or pills) as directed by your health care provider.  Avoid over-the-counter medicines or cough syrups that dry up your airway (such as antihistamines) and slow down the elimination of secretions unless instructed otherwise by your health care provider.   If you are a smoker, the most important thing that you can do is stop smoking. Continuing to smoke will cause further lung damage and breathing trouble. Ask your health care provider for help with quitting smoking. He or she can direct you to community resources or hospitals that provide support.  Avoid exposure to irritants such as smoke, chemicals, and fumes that aggravate your breathing.  Use oxygen therapy and pulmonary rehabilitation if directed by your health care provider. If you require home oxygen therapy, ask your health care provider whether you should purchase a pulse oximeter to measure your oxygen level at home.   Avoid contact with individuals who have a contagious illness.  Avoid extreme temperature and humidity changes.  Eat healthy foods. Eating smaller, more frequent meals and resting before meals may help you maintain your strength.  Stay active, but balance activity with periods of rest. Exercise and physical activity will help you maintain your ability to do things you want to do.  Preventing infection and hospitalization is very important when you have COPD. Make sure to receive all the vaccines your health care provider recommends, especially the pneumococcal  and influenza vaccines. Ask your health care provider whether you need a pneumonia vaccine.  Learn and use relaxation techniques to manage stress.  Learn and use controlled breathing techniques as  directed by your health care provider. Controlled breathing techniques include:   Pursed lip breathing. Start by breathing in (inhaling) through your nose for 1 second. Then, purse your lips as if you were going to whistle and breathe out (exhale) through the pursed lips for 2 seconds.   Diaphragmatic breathing. Start by putting one hand on your abdomen just above your waist. Inhale slowly through your nose. The hand on your abdomen should move out. Then purse your lips and exhale slowly. You should be able to feel the hand on your abdomen moving in as you exhale.   Learn and use controlled coughing to clear mucus from your lungs. Controlled coughing is a series of short, progressive coughs. The steps of controlled coughing are:  1. Lean your head slightly forward.  2. Breathe in deeply using diaphragmatic breathing.  3. Try to hold your breath for 3 seconds.  4. Keep your mouth slightly open while coughing twice.  5. Spit any mucus out into a tissue.  6. Rest and repeat the steps once or twice as needed. SEEK MEDICAL CARE IF:   You are coughing up more mucus than usual.   There is a change in the color or thickness of your mucus.   Your breathing is more labored than usual.   Your breathing is faster than usual.  SEEK IMMEDIATE MEDICAL CARE IF:   You have shortness of breath while you are resting.   You have shortness of breath that prevents you from:  Being able to talk.   Performing your usual physical activities.   You have chest pain lasting longer than 5 minutes.   Your skin color is more cyanotic than usual.  You measure low oxygen saturations for longer than 5 minutes with a pulse oximeter. MAKE SURE YOU:   Understand these instructions.  Will watch your condition.  Will get help right away if you are not doing well or get worse. Document Released: 06/24/2005 Document Revised: 01/29/2014 Document Reviewed: 05/11/2013 [facility] Patient Information  2015 [place], [place]. This information is not intended to replace advice given to you by your health care provider. Make sure you discuss any questions you have with your health care provider.

## 2015-01-14 NOTE — ED Notes (Signed)
Pt. Called EMS for SOB on EMS arrival pt. Was wheezing and received albuterol. Pt. Then c/o chest tightness in left chest. Pt. Was given 125 solumedrol. Pt states he took 2 adult aspirin.

## 2015-01-14 NOTE — ED Notes (Signed)
Ambulated patient in hall for a short distance. Patient stated "i don't have the energy for this" and returned to the exam room after ambulating a short distance. Patient O2 level prior to ambulation was 100% with HR 93. O2 level during ambulation was 98% with HR 103. Patient ambulated approximately 50-60 ft distance and seemed to ambulate well with no assistance.

## 2015-02-20 ENCOUNTER — Emergency Department (HOSPITAL_COMMUNITY): Payer: Medicaid Other

## 2015-02-20 ENCOUNTER — Emergency Department (HOSPITAL_COMMUNITY)
Admission: EM | Admit: 2015-02-20 | Discharge: 2015-02-20 | Disposition: A | Discharge status: Home / Self Care | Payer: Medicaid Other | Attending: Emergency Medicine | Admitting: Emergency Medicine

## 2015-02-20 ENCOUNTER — Encounter (HOSPITAL_COMMUNITY): Payer: Self-pay | Admitting: Emergency Medicine

## 2015-02-20 DIAGNOSIS — H9190 Unspecified hearing loss, unspecified ear: Secondary | ICD-10-CM | POA: Insufficient documentation

## 2015-02-20 DIAGNOSIS — Z87891 Personal history of nicotine dependence: Secondary | ICD-10-CM | POA: Diagnosis not present

## 2015-02-20 DIAGNOSIS — Z79899 Other long term (current) drug therapy: Secondary | ICD-10-CM | POA: Insufficient documentation

## 2015-02-20 DIAGNOSIS — R0602 Shortness of breath: Secondary | ICD-10-CM | POA: Diagnosis present

## 2015-02-20 DIAGNOSIS — J441 Chronic obstructive pulmonary disease with (acute) exacerbation: Secondary | ICD-10-CM | POA: Diagnosis not present

## 2015-02-20 DIAGNOSIS — Z7952 Long term (current) use of systemic steroids: Secondary | ICD-10-CM | POA: Diagnosis not present

## 2015-02-20 LAB — COMPREHENSIVE METABOLIC PANEL
ALBUMIN: 3.6 g/dL (ref 3.5–5.0)
ALK PHOS: 47 U/L (ref 38–126)
ALT: 16 U/L — AB (ref 17–63)
AST: 19 U/L (ref 15–41)
Anion gap: 12 (ref 5–15)
BILIRUBIN TOTAL: 0.5 mg/dL (ref 0.3–1.2)
BUN: 14 mg/dL (ref 6–20)
CALCIUM: 9 mg/dL (ref 8.9–10.3)
CHLORIDE: 104 mmol/L (ref 101–111)
CO2: 20 mmol/L — ABNORMAL LOW (ref 22–32)
Creatinine, Ser: 0.79 mg/dL (ref 0.61–1.24)
GFR calc Af Amer: >60 mL/min (ref >60)
Glucose, Bld: 83 mg/dL (ref 65–99)
POTASSIUM: 4.4 mmol/L (ref 3.5–5.1)
SODIUM: 136 mmol/L (ref 135–145)
TOTAL PROTEIN: 6 g/dL — AB (ref 6.5–8.1)

## 2015-02-20 LAB — CBC WITH DIFFERENTIAL/PLATELET
BASOS PCT: 1 % (ref 0–1)
Basophils Absolute: 0.1 10*3/uL (ref 0.0–0.1)
EOS PCT: 7 % — AB (ref 0–5)
Eosinophils Absolute: 0.5 10*3/uL (ref 0.0–0.7)
HEMATOCRIT: 41.3 % (ref 39.0–52.0)
Hemoglobin: 13.4 g/dL (ref 13.0–17.0)
LYMPHS PCT: 14 % (ref 12–46)
Lymphs Abs: 1 10*3/uL (ref 0.7–4.0)
MCH: 32.7 pg (ref 26.0–34.0)
MCHC: 32.4 g/dL (ref 30.0–36.0)
MCV: 100.7 fL — ABNORMAL HIGH (ref 78.0–100.0)
MONO ABS: 0.3 10*3/uL (ref 0.1–1.0)
Monocytes Relative: 5 % (ref 3–12)
NEUTROS ABS: 5.3 10*3/uL (ref 1.7–7.7)
Neutrophils Relative %: 73 % (ref 43–77)
Platelets: 209 10*3/uL (ref 150–400)
RBC: 4.1 MIL/uL — ABNORMAL LOW (ref 4.22–5.81)
RDW: 12.6 % (ref 11.5–15.5)
WBC: 7.2 10*3/uL (ref 4.0–10.5)

## 2015-02-20 LAB — I-STAT TROPONIN, ED: Troponin i, poc: 0.01 ng/mL (ref 0.00–0.08)

## 2015-02-20 LAB — BRAIN NATRIURETIC PEPTIDE: B Natriuretic Peptide: 86.2 pg/mL (ref 0.0–100.0)

## 2015-02-20 MED ORDER — ALBUTEROL SULFATE HFA 108 (90 BASE) MCG/ACT IN AERS
2.0000 | INHALATION_SPRAY | Freq: Once | RESPIRATORY_TRACT | Status: AC
  Administered 2015-02-20: 2 via RESPIRATORY_TRACT
  Filled 2015-02-20: qty 6.7

## 2015-02-20 NOTE — ED Notes (Signed)
MD at bedside. 

## 2015-02-20 NOTE — ED Provider Notes (Signed)
CSN: [id number]     Arrival date & time 02/20/15  1905 History   First MD Initiated Contact with Patient 02/20/15 1910     Chief Complaint  Patient presents with  . Shortness of Breath   Anthony Bolton is a 64 y.o. male with a past medical history significant for COPD who presents with shortness of breath. The patient says that he woke up this morning short of breath and, despite laying in bed and "waiting it out" it continued to worsen. The patient reports that he is out of his Combivent medicine. The patient specifically denies any fevers, chills, chest pain, nausea, vomiting, constipation, diarrhea, dysuria, abdominal pain. The patient says that he was feeling normal last night before bed. The patient denies any new medicines or any other symptoms. In route with EMS, the patient received albuterol, Atrovent, magnesium, and Solu-Medrol. The patient says that he is feeling subjectively better with his breathing and his oxygen saturations are 100% on nonrebreather on arrival. The patient is wheezing audibly.   (Consider location/radiation/quality/duration/timing/severity/associated sxs/prior Treatment) Patient is a 64 y.o. male presenting with shortness of breath. The history is provided by the patient, the EMS personnel and medical records. No language interpreter was used.  Shortness of Breath Severity:  Severe Onset quality:  Gradual Duration:  1 day Timing:  Constant Progression:  Worsening Chronicity:  Recurrent Relieved by:  Inhaler and oxygen Worsened by:  Nothing tried Ineffective treatments:  None tried Associated symptoms: wheezing   Associated symptoms: no abdominal pain, no chest pain, no cough, no diaphoresis, no fever, no headaches, no hemoptysis, no sputum production, no syncope and no vomiting     Past Medical History  Diagnosis Date  . COPD (chronic obstructive pulmonary disease)   . HOH (hard of hearing)    Past Surgical History  Procedure Laterality Date  .  Appendectomy    . Left heart catheterization with coronary angiogram N/A 11/07/2013    Procedure: LEFT HEART CATHETERIZATION WITH CORONARY ANGIOGRAM;  Surgeon: Clent Demark, MD;  Location: [facility] CATH LAB;  Service: Cardiovascular;  Laterality: N/A;  . Cardiac stents      No stent history   No family history on file. History  Substance Use Topics  . Smoking status: Former Smoker    Types: Cigarettes  . Smokeless tobacco: Never Used  . Alcohol Use: No    Review of Systems  Constitutional: Negative for fever, chills, diaphoresis and appetite change.  HENT: Negative for congestion and rhinorrhea.   Respiratory: Positive for shortness of breath and wheezing. Negative for cough, hemoptysis, sputum production, chest tightness and stridor.   Cardiovascular: Negative for chest pain, palpitations, leg swelling and syncope.  Gastrointestinal: Negative for nausea, vomiting, abdominal pain, diarrhea and constipation.  Genitourinary: Negative for dysuria.  Musculoskeletal: Negative for back pain and neck stiffness.  Skin: Negative for wound.  Neurological: Negative for dizziness and headaches.  Psychiatric/Behavioral: Negative for agitation.  All other systems reviewed and are negative.     Allergies  Review of patient's allergies indicates no known allergies.  Home Medications   Prior to Admission medications   Medication Sig Start Date End Date Taking? Authorizing Provider  albuterol (PROVENTIL HFA;VENTOLIN HFA) 108 (90 BASE) MCG/ACT inhaler Inhale 2 puffs into the lungs every 4 (four) hours as needed for wheezing or shortness of breath. Patient not taking: Reported on 01/14/2015 11/20/14   Sherwood Gambler, MD  albuterol (PROVENTIL HFA;VENTOLIN HFA) 108 (90 BASE) MCG/ACT inhaler Inhale 2 puffs into the lungs  every 6 (six) hours as needed for wheezing or shortness of breath. 01/09/15   Pamella Pert, MD  albuterol (PROVENTIL) (2.5 MG/3ML) 0.083% nebulizer solution Take 2.5 mg by  nebulization every 6 (six) hours as needed for wheezing or shortness of breath.    Historical Provider, MD  albuterol (PROVENTIL) (2.5 MG/3ML) 0.083% nebulizer solution Take 3 mLs (2.5 mg total) by nebulization every 4 (four) hours as needed for wheezing or shortness of breath. Patient not taking: Reported on 01/14/2015 01/09/15   Pamella Pert, MD  aspirin 325 MG tablet Take 650 mg by mouth every 6 (six) hours as needed for mild pain or headache.     Historical Provider, MD  atorvastatin (LIPITOR) 80 MG tablet Take 1 tablet (80 mg total) by mouth at bedtime. Patient not taking: Reported on 10/22/2014 11/08/13   Ripudeep Krystal Eaton, MD  azithromycin (ZITHROMAX) 250 MG tablet Take 1 tablet (250 mg total) by mouth daily. Take first 2 tablets together, then 1 every day until finished. Patient not taking: Reported on 10/22/2014 10/10/14   Quintella Reichert, MD  carvedilol (COREG) 3.125 MG tablet Take 1 tablet (3.125 mg total) by mouth 2 (two) times daily with a meal. Patient not taking: Reported on 10/10/2014 11/08/13   Ripudeep Krystal Eaton, MD  digoxin (LANOXIN) 0.125 MG tablet Take 1 tablet (0.125 mg total) by mouth daily. Patient not taking: Reported on 10/10/2014 11/08/13   Ripudeep Krystal Eaton, MD  Ipratropium-Albuterol (COMBIVENT) 20-100 MCG/ACT AERS respimat Inhale 1 puff into the lungs every 6 (six) hours as needed for wheezing or shortness of breath. 12/29/14   Alfonzo Beers, MD  Melatonin 1 MG CAPS Take 2 capsules by mouth at bedtime.     Historical Provider, MD  nicotine (NICODERM CQ - DOSED IN MG/24 HOURS) 21 mg/24hr patch Place 1 patch (21 mg total) onto the skin daily. Patient not taking: Reported on 10/22/2014 11/08/13   Ripudeep Krystal Eaton, MD  polyethylene glycol (MIRALAX / GLYCOLAX) packet Take 17 g by mouth daily. Patient not taking: Reported on 01/14/2015 01/09/15   Pamella Pert, MD  predniSONE (DELTASONE) 50 MG tablet Take 1 tablet (50 mg total) by mouth daily. 01/14/15   Varney Biles, MD  ramipril (ALTACE) 1.25  MG capsule Take 1 capsule (1.25 mg total) by mouth daily. Patient not taking: Reported on 10/10/2014 11/08/13   Ripudeep Krystal Eaton, MD  traZODone (DESYREL) 50 MG tablet Take 50 mg by mouth at bedtime.    Historical Provider, MD   BP 128/79 mmHg  Pulse 100  Resp 14  SpO2 100% Physical Exam  Constitutional: He is oriented to person, place, and time. He appears well-developed and well-nourished. No distress.  HENT:  Head: Normocephalic and atraumatic.  Mouth/Throat: Oropharynx is clear and moist. No oropharyngeal exudate.  Eyes: Conjunctivae and EOM are normal. Pupils are equal, round, and reactive to light. No scleral icterus.  Neck: Normal range of motion.  Cardiovascular: Regular rhythm, normal heart sounds and intact distal pulses.   No murmur heard. Pulmonary/Chest: No accessory muscle usage or stridor. Tachypnea noted. He is in respiratory distress. He has decreased breath sounds. He has wheezes. He exhibits no tenderness.  Abdominal: Soft. Bowel sounds are normal. He exhibits no distension. There is no tenderness. There is no rebound.  Musculoskeletal: He exhibits no tenderness.  Neurological: He is alert and oriented to person, place, and time. He exhibits normal muscle tone.  Skin: Skin is warm. He is not diaphoretic. No pallor.  Psychiatric: He has a  normal mood and affect.  Nursing note and vitals reviewed.   ED Course  Procedures (including critical care time) Labs Review Labs Reviewed  COMPREHENSIVE METABOLIC PANEL - Abnormal; Notable for the following:    CO2 20 (*)    Total Protein 6.0 (*)    ALT 16 (*)    All other components within normal limits  CBC WITH DIFFERENTIAL/PLATELET - Abnormal; Notable for the following:    RBC 4.10 (*)    MCV 100.7 (*)    Eosinophils Relative 7 (*)    All other components within normal limits  BRAIN NATRIURETIC PEPTIDE  I-STAT TROPOININ, ED    Imaging Review Dg Chest Port 1 View  02/20/2015   CLINICAL DATA:  Shortness of breath.   COPD.  EXAM: PORTABLE CHEST - 1 VIEW  COMPARISON:  01/14/2015  FINDINGS: Cardiomediastinal silhouette is within normal limits. The lungs are hyperinflated consistent with COPD. No airspace consolidation, edema, pleural effusion, or pneumothorax is identified. No acute osseous abnormality is seen.  IMPRESSION: COPD without evidence of acute airspace disease.   Electronically Signed   By: Logan Bores   On: 02/20/2015 20:10     EKG Interpretation None      MDM   ADONYS WILDES is a 64 y.o. male with a past medical history significant for COPD who presents with shortness of breath. Given the patient's history of COPD exacerbations and his lack of chest pain, there is concern for a possible repeat COPD exacerbation. The patient reports that he has been out of his inhaler for the last several days. The patient denies any fevers or chills and denies a productive cough making pneumonia less likely. The patient received steroids, breathing treatments, and magnesium by EMS while in route to the ED. The patient was given another Breathing treatment upon arrival. The patient had diagnostic laboratory imaging testing ordered.  The patient's troponin was unremarkable, CMP was unremarkable, is BNP Was normal, and his CBC did not show evidence of anemia or leukocytosis. The patient's chest x-ray Showed evidence of COPD with no other acute airspace abnormalities. The patient's EKG showed no acute ischemia.  Following his breathing treatment, the patient had significant improvement in his air movement, his wheezing, and his subjective respiratory effort. The patient reported that he was breathing much easier and was able to rest and sleep. The patient was gradually weaned on his oxygen and was completely taken off and put on room air. The patient was able to maintain his oxygen saturation's into the 90s consistently on room air. The patient was walked and maintained his Oxygen saturation in the 90s With  ambulation.  The patient was given a dose of albuterol and was given the inhaler. The patient reported that he felt appropriate for discharge and will follow up with his PCP. The patient was given return precautions for any new or worsening symptoms. The patient likely had a COPD exacerbation with no component of pneumonia. The patient had no other questions, concerns, or complaints and was discharged in good condition.  This patient was seen with Dr. Aline Brochure, emergency medicine attending.    Final diagnoses:  COPD exacerbation       Anthony Blackbird, MD 02/21/15 [id number]  Pamella Pert, MD 02/21/15 [phone]

## 2015-02-20 NOTE — Discharge Instructions (Signed)

## 2015-02-20 NOTE — ED Notes (Signed)
Pt is from home, EMS reports hx of COPD and cardiac stents, EMS reports pt's 02 sats were 84% on RA. EMS adm 10mg  of albuterol, 0.5 mg of atrovent, 2mg  of magnesium, 125 mg of solu-medrol.

## 2015-02-20 NOTE — ED Notes (Signed)
Pt unsteady on his feet with ambulation, pt reports "he feels worn out". PT's 02 sats 91% with ambulation, HR 117.

## 2015-02-23 ENCOUNTER — Encounter (HOSPITAL_COMMUNITY): Payer: Self-pay | Admitting: Emergency Medicine

## 2015-02-23 ENCOUNTER — Emergency Department (HOSPITAL_COMMUNITY)
Admission: EM | Admit: 2015-02-23 | Discharge: 2015-02-24 | Disposition: A | Discharge status: Home / Self Care | Payer: Medicaid Other | Attending: Emergency Medicine | Admitting: Emergency Medicine

## 2015-02-23 DIAGNOSIS — Z79899 Other long term (current) drug therapy: Secondary | ICD-10-CM | POA: Insufficient documentation

## 2015-02-23 DIAGNOSIS — J441 Chronic obstructive pulmonary disease with (acute) exacerbation: Secondary | ICD-10-CM | POA: Diagnosis not present

## 2015-02-23 DIAGNOSIS — R0602 Shortness of breath: Secondary | ICD-10-CM

## 2015-02-23 DIAGNOSIS — R0789 Other chest pain: Secondary | ICD-10-CM

## 2015-02-23 DIAGNOSIS — Z87891 Personal history of nicotine dependence: Secondary | ICD-10-CM | POA: Insufficient documentation

## 2015-02-23 DIAGNOSIS — H9190 Unspecified hearing loss, unspecified ear: Secondary | ICD-10-CM | POA: Diagnosis not present

## 2015-02-23 DIAGNOSIS — Z7952 Long term (current) use of systemic steroids: Secondary | ICD-10-CM | POA: Diagnosis not present

## 2015-02-23 NOTE — ED Notes (Signed)
Bed: WA04 Expected date:  Expected time:  Means of arrival:  Comments: EMS: 63y/o shortness of breath

## 2015-02-23 NOTE — ED Notes (Signed)
Brought in by EMS from home with c/o shortness of breath and chest pain.  Pt reported that he has been having shortness of breath today--- has taken Albuterol inhaler without relief.  Pt also c/o chest pain, no radiation.  Pt was given Albuterol 5 mg and Atrovent 0.5 mg neb tx en route to ED.

## 2015-02-23 NOTE — ED Provider Notes (Signed)
CSN: [id number]     Arrival date & time 02/23/15  2341 History   First MD Initiated Contact with Patient 02/23/15 2353     Chief Complaint  Patient presents with  . Shortness of Breath     (Consider location/radiation/quality/duration/timing/severity/associated sxs/prior Treatment) HPI  This is a 64 year old male with a history of COPD. He was recently seen in the ED with the same. He is here with continuing shortness of breath not relieved by albuterol. He states his albuterol inhaler ran out and EMS threw it away on his way to the ED this evening. He was given an albuterol neb treatment by EMS with relief. Symptoms are moderate and worse with exertion or lying supine. He has also been having intermittent chest pain. The chest pain is sharp, well localized in the left lower chest and lasts about 30-45 seconds at a time. There is no known trigger. He is equivocal about whether it changes with deep breathing. He states there is also an associated dull pain that lasts for several minutes after the sharp pain occurs. He denies pain at the present time.  Past Medical History  Diagnosis Date  . COPD (chronic obstructive pulmonary disease)   . HOH (hard of hearing)    Past Surgical History  Procedure Laterality Date  . Appendectomy    . Left heart catheterization with coronary angiogram N/A 11/07/2013    Procedure: LEFT HEART CATHETERIZATION WITH CORONARY ANGIOGRAM;  Surgeon: Clent Demark, MD;  Location: [facility] CATH LAB;  Service: Cardiovascular;  Laterality: N/A;  . Cardiac stents      No stent history   History reviewed. No pertinent family history. History  Substance Use Topics  . Smoking status: Former Smoker    Types: Cigarettes  . Smokeless tobacco: Never Used  . Alcohol Use: No    Review of Systems  All other systems reviewed and are negative.   Allergies  Review of patient's allergies indicates no known allergies.  Home Medications   Prior to Admission medications    Medication Sig Start Date End Date Taking? Authorizing Provider  albuterol (PROVENTIL HFA;VENTOLIN HFA) 108 (90 BASE) MCG/ACT inhaler Inhale 2 puffs into the lungs every 4 (four) hours as needed for wheezing or shortness of breath. Patient not taking: Reported on 01/14/2015 11/20/14   Sherwood Gambler, MD  albuterol (PROVENTIL HFA;VENTOLIN HFA) 108 (90 BASE) MCG/ACT inhaler Inhale 2 puffs into the lungs every 6 (six) hours as needed for wheezing or shortness of breath. 01/09/15   Pamella Pert, MD  albuterol (PROVENTIL) (2.5 MG/3ML) 0.083% nebulizer solution Take 2.5 mg by nebulization every 6 (six) hours as needed for wheezing or shortness of breath.    Historical Provider, MD  albuterol (PROVENTIL) (2.5 MG/3ML) 0.083% nebulizer solution Take 3 mLs (2.5 mg total) by nebulization every 4 (four) hours as needed for wheezing or shortness of breath. Patient not taking: Reported on 01/14/2015 01/09/15   Pamella Pert, MD  aspirin 325 MG tablet Take 650 mg by mouth every 6 (six) hours as needed for mild pain or headache.     Historical Provider, MD  atorvastatin (LIPITOR) 80 MG tablet Take 1 tablet (80 mg total) by mouth at bedtime. Patient not taking: Reported on 10/22/2014 11/08/13   Ripudeep Krystal Eaton, MD  azithromycin (ZITHROMAX) 250 MG tablet Take 1 tablet (250 mg total) by mouth daily. Take first 2 tablets together, then 1 every day until finished. Patient not taking: Reported on 10/22/2014 10/10/14   Quintella Reichert, MD  carvedilol (  COREG) 3.125 MG tablet Take 1 tablet (3.125 mg total) by mouth 2 (two) times daily with a meal. Patient not taking: Reported on 10/10/2014 11/08/13   Ripudeep Krystal Eaton, MD  digoxin (LANOXIN) 0.125 MG tablet Take 1 tablet (0.125 mg total) by mouth daily. Patient not taking: Reported on 10/10/2014 11/08/13   Ripudeep Krystal Eaton, MD  Ipratropium-Albuterol (COMBIVENT) 20-100 MCG/ACT AERS respimat Inhale 1 puff into the lungs every 6 (six) hours as needed for wheezing or shortness of breath.  12/29/14   Alfonzo Beers, MD  Melatonin 1 MG CAPS Take 2 capsules by mouth at bedtime.     Historical Provider, MD  nicotine (NICODERM CQ - DOSED IN MG/24 HOURS) 21 mg/24hr patch Place 1 patch (21 mg total) onto the skin daily. Patient not taking: Reported on 10/22/2014 11/08/13   Ripudeep Krystal Eaton, MD  polyethylene glycol (MIRALAX / GLYCOLAX) packet Take 17 g by mouth daily. Patient not taking: Reported on 01/14/2015 01/09/15   Pamella Pert, MD  predniSONE (DELTASONE) 50 MG tablet Take 1 tablet (50 mg total) by mouth daily. 01/14/15   Varney Biles, MD  ramipril (ALTACE) 1.25 MG capsule Take 1 capsule (1.25 mg total) by mouth daily. Patient not taking: Reported on 10/10/2014 11/08/13   Ripudeep Krystal Eaton, MD  traZODone (DESYREL) 50 MG tablet Take 50 mg by mouth at bedtime.    Historical Provider, MD   BP 125/84 mmHg  Pulse 70  Temp(Src) 97.7 F (36.5 C) (Oral)  Resp 18  SpO2 98%   Physical Exam  General: Well-developed, thin male in no acute distress; appearance consistent with age of record HENT: normocephalic; atraumatic Eyes: pupils equal, round and reactive to light; extraocular muscles intact Neck: supple Heart: regular rate and rhythm; distant sounds Lungs: clear to auscultation bilaterally; distant sounds  Abdomen: soft; nondistended; nontender; no masses or hepatosplenomegaly; bowel sounds present Extremities: No deformity; full range of motion; pulses normal Neurologic: Awake, alert and oriented;  profoundly hard of hearing; motor function intact in all extremities and symmetric; no facial droop Skin: Warm and dry Psychiatric: Normal mood and affect    ED Course  Procedures (including critical care time)   MDM  Nursing notes and vitals signs, including pulse oximetry, reviewed.  Summary of this visit's results, reviewed by myself:   EKG Interpretation  Date/Time:  Saturday Feb 23 2015 23:47:42 EDT Ventricular Rate:  77 PR Interval:  144 QRS Duration: 86 QT  Interval:  373 QTC Calculation: 422 R Axis:   76 Text Interpretation:  Sinus rhythm Anteroseptal infarct, age indeterminate Rate is slower Confirmed by Dhruvan Gullion  MD, Jenny Reichmann ([id number]) on 02/23/2015 11:55:32 PM       Labs:  Results for orders placed or performed during the hospital encounter of 02/23/15 (from the past 24 hour(s))  Basic metabolic panel     Status: Abnormal   Collection Time: 02/24/15 12:07 AM  Result Value Ref Range   Sodium 136 135 - 145 mmol/L   Potassium 3.7 3.5 - 5.1 mmol/L   Chloride 100 (L) 101 - 111 mmol/L   CO2 28 22 - 32 mmol/L   Glucose, Bld 107 (H) 65 - 99 mg/dL   BUN 12 6 - 20 mg/dL   Creatinine, Ser 0.73 0.61 - 1.24 mg/dL   Calcium 8.9 8.9 - 10.3 mg/dL   GFR calc non Af Amer >60 >60 mL/min   GFR calc Af Amer >60 >60 mL/min   Anion gap 8 5 - 15  CBC with Differential/Platelet  Status: Abnormal   Collection Time: 02/24/15 12:07 AM  Result Value Ref Range   WBC 6.7 4.0 - 10.5 K/uL   RBC 3.87 (L) 4.22 - 5.81 MIL/uL   Hemoglobin 12.5 (L) 13.0 - 17.0 g/dL   HCT 38.2 (L) 39.0 - 52.0 %   MCV 98.7 78.0 - 100.0 fL   MCH 32.3 26.0 - 34.0 pg   MCHC 32.7 30.0 - 36.0 g/dL   RDW 12.3 11.5 - 15.5 %   Platelets 196 150 - 400 K/uL   Neutrophils Relative % 64 43 - 77 %   Neutro Abs 4.3 1.7 - 7.7 K/uL   Lymphocytes Relative 18 12 - 46 %   Lymphs Abs 1.2 0.7 - 4.0 K/uL   Monocytes Relative 9 3 - 12 %   Monocytes Absolute 0.6 0.1 - 1.0 K/uL   Eosinophils Relative 8 (H) 0 - 5 %   Eosinophils Absolute 0.5 0.0 - 0.7 K/uL   Basophils Relative 1 0 - 1 %   Basophils Absolute 0.1 0.0 - 0.1 K/uL  Troponin I     Status: None   Collection Time: 02/24/15 12:07 AM  Result Value Ref Range   Troponin I <0.03 <0.031 ng/mL  I-stat troponin, ED  (not at [facility], [facility])     Status: None   Collection Time: 02/24/15 12:11 AM  Result Value Ref Range   Troponin i, poc 0.00 0.00 - 0.08 ng/mL   Comment 3          Troponin I     Status: None   Collection Time: 02/24/15  1:57 AM  Result  Value Ref Range   Troponin I <0.03 <0.031 ng/mL    Imaging Studies: Dg Chest Port 1 View  02/24/2015   CLINICAL DATA:  Acute onset of shortness of breath and generalized chest pain. Initial encounter.  EXAM: PORTABLE CHEST - 1 VIEW  COMPARISON:  Chest radiograph performed 02/20/2015  FINDINGS: The lungs are well-aerated. Minimal right lower lung zone airspace opacity may reflect mild pneumonia. There is no evidence of pleural effusion or pneumothorax.  The cardiomediastinal silhouette is within normal limits. No acute osseous abnormalities are seen. There is chronic fragmentation involving the distal left clavicle.  IMPRESSION: Minimal right lower lung zone airspace opacity may reflect mild pneumonia.   Electronically Signed   By: Garald Balding M.D.   On: 02/24/2015 00:23   1:56 AM Resting comfortably without distress or pain.  3:42 AM Continues to rest comfortably. Troponins have been negative.    Shanon Rosser, MD 02/24/15 [phone]

## 2015-02-24 ENCOUNTER — Emergency Department (HOSPITAL_COMMUNITY): Payer: Medicaid Other

## 2015-02-24 LAB — CBC WITH DIFFERENTIAL/PLATELET
BASOS ABS: 0.1 10*3/uL (ref 0.0–0.1)
BASOS PCT: 1 % (ref 0–1)
EOS PCT: 8 % — AB (ref 0–5)
Eosinophils Absolute: 0.5 10*3/uL (ref 0.0–0.7)
HEMATOCRIT: 38.2 % — AB (ref 39.0–52.0)
HEMOGLOBIN: 12.5 g/dL — AB (ref 13.0–17.0)
LYMPHS ABS: 1.2 10*3/uL (ref 0.7–4.0)
LYMPHS PCT: 18 % (ref 12–46)
MCH: 32.3 pg (ref 26.0–34.0)
MCHC: 32.7 g/dL (ref 30.0–36.0)
MCV: 98.7 fL (ref 78.0–100.0)
MONO ABS: 0.6 10*3/uL (ref 0.1–1.0)
Monocytes Relative: 9 % (ref 3–12)
NEUTROS PCT: 64 % (ref 43–77)
Neutro Abs: 4.3 10*3/uL (ref 1.7–7.7)
PLATELETS: 196 10*3/uL (ref 150–400)
RBC: 3.87 MIL/uL — ABNORMAL LOW (ref 4.22–5.81)
RDW: 12.3 % (ref 11.5–15.5)
WBC: 6.7 10*3/uL (ref 4.0–10.5)

## 2015-02-24 LAB — I-STAT TROPONIN, ED: Troponin i, poc: 0 ng/mL (ref 0.00–0.08)

## 2015-02-24 LAB — BASIC METABOLIC PANEL
Anion gap: 8 (ref 5–15)
BUN: 12 mg/dL (ref 6–20)
CO2: 28 mmol/L (ref 22–32)
CREATININE: 0.73 mg/dL (ref 0.61–1.24)
Calcium: 8.9 mg/dL (ref 8.9–10.3)
Chloride: 100 mmol/L — ABNORMAL LOW (ref 101–111)
Glucose, Bld: 107 mg/dL — ABNORMAL HIGH (ref 65–99)
Potassium: 3.7 mmol/L (ref 3.5–5.1)
Sodium: 136 mmol/L (ref 135–145)

## 2015-02-24 LAB — TROPONIN I
Troponin I: <0.03 ng/mL (ref <0.031)
Troponin I: <0.03 ng/mL (ref <0.031)

## 2015-02-24 MED ORDER — AEROCHAMBER PLUS W/MASK MISC
1.0000 | Freq: Once | Status: AC
  Administered 2015-02-24: 1
  Filled 2015-02-24: qty 1

## 2015-02-24 MED ORDER — ALBUTEROL SULFATE HFA 108 (90 BASE) MCG/ACT IN AERS
2.0000 | INHALATION_SPRAY | RESPIRATORY_TRACT | Status: DC | PRN
  Filled 2015-02-24: qty 6.7

## 2015-03-01 ENCOUNTER — Emergency Department (HOSPITAL_COMMUNITY): Payer: Medicaid Other

## 2015-03-01 ENCOUNTER — Other Ambulatory Visit: Payer: Self-pay

## 2015-03-01 ENCOUNTER — Inpatient Hospital Stay (HOSPITAL_COMMUNITY)
Admission: EM | Admit: 2015-03-01 | Discharge: 2015-03-04 | DRG: 190 | Disposition: A | Discharge status: Home Health | Payer: Medicaid Other | Attending: Internal Medicine | Admitting: Internal Medicine

## 2015-03-01 ENCOUNTER — Encounter (HOSPITAL_COMMUNITY): Payer: Self-pay | Admitting: Emergency Medicine

## 2015-03-01 DIAGNOSIS — S40861A Insect bite (nonvenomous) of right upper arm, initial encounter: Secondary | ICD-10-CM | POA: Diagnosis present

## 2015-03-01 DIAGNOSIS — I259 Chronic ischemic heart disease, unspecified: Secondary | ICD-10-CM | POA: Diagnosis present

## 2015-03-01 DIAGNOSIS — J441 Chronic obstructive pulmonary disease with (acute) exacerbation: Principal | ICD-10-CM | POA: Diagnosis present

## 2015-03-01 DIAGNOSIS — Z681 Body mass index (BMI) 19 or less, adult: Secondary | ICD-10-CM

## 2015-03-01 DIAGNOSIS — H9190 Unspecified hearing loss, unspecified ear: Secondary | ICD-10-CM | POA: Diagnosis present

## 2015-03-01 DIAGNOSIS — S80861A Insect bite (nonvenomous), right lower leg, initial encounter: Secondary | ICD-10-CM | POA: Diagnosis present

## 2015-03-01 DIAGNOSIS — E860 Dehydration: Secondary | ICD-10-CM | POA: Diagnosis present

## 2015-03-01 DIAGNOSIS — J9621 Acute and chronic respiratory failure with hypoxia: Secondary | ICD-10-CM | POA: Diagnosis present

## 2015-03-01 DIAGNOSIS — E43 Unspecified severe protein-calorie malnutrition: Secondary | ICD-10-CM | POA: Diagnosis present

## 2015-03-01 DIAGNOSIS — S80862A Insect bite (nonvenomous), left lower leg, initial encounter: Secondary | ICD-10-CM | POA: Diagnosis present

## 2015-03-01 DIAGNOSIS — Z7982 Long term (current) use of aspirin: Secondary | ICD-10-CM | POA: Diagnosis not present

## 2015-03-01 DIAGNOSIS — L299 Pruritus, unspecified: Secondary | ICD-10-CM | POA: Diagnosis present

## 2015-03-01 DIAGNOSIS — E440 Moderate protein-calorie malnutrition: Secondary | ICD-10-CM | POA: Insufficient documentation

## 2015-03-01 DIAGNOSIS — E785 Hyperlipidemia, unspecified: Secondary | ICD-10-CM | POA: Diagnosis present

## 2015-03-01 DIAGNOSIS — S40862A Insect bite (nonvenomous) of left upper arm, initial encounter: Secondary | ICD-10-CM | POA: Diagnosis present

## 2015-03-01 DIAGNOSIS — J9620 Acute and chronic respiratory failure, unspecified whether with hypoxia or hypercapnia: Secondary | ICD-10-CM | POA: Diagnosis not present

## 2015-03-01 DIAGNOSIS — F17210 Nicotine dependence, cigarettes, uncomplicated: Secondary | ICD-10-CM | POA: Diagnosis present

## 2015-03-01 DIAGNOSIS — W57XXXA Bitten or stung by nonvenomous insect and other nonvenomous arthropods, initial encounter: Secondary | ICD-10-CM | POA: Diagnosis present

## 2015-03-01 DIAGNOSIS — E0590 Thyrotoxicosis, unspecified without thyrotoxic crisis or storm: Secondary | ICD-10-CM | POA: Diagnosis present

## 2015-03-01 DIAGNOSIS — S1096XA Insect bite of unspecified part of neck, initial encounter: Secondary | ICD-10-CM | POA: Diagnosis present

## 2015-03-01 DIAGNOSIS — J189 Pneumonia, unspecified organism: Secondary | ICD-10-CM

## 2015-03-01 DIAGNOSIS — B888 Other specified infestations: Secondary | ICD-10-CM | POA: Diagnosis present

## 2015-03-01 DIAGNOSIS — Z955 Presence of coronary angioplasty implant and graft: Secondary | ICD-10-CM

## 2015-03-01 DIAGNOSIS — R0602 Shortness of breath: Secondary | ICD-10-CM | POA: Diagnosis not present

## 2015-03-01 DIAGNOSIS — I5022 Chronic systolic (congestive) heart failure: Secondary | ICD-10-CM

## 2015-03-01 HISTORY — DX: Heart failure, unspecified: I50.9

## 2015-03-01 LAB — CBC
HEMATOCRIT: 44.8 % (ref 39.0–52.0)
HEMOGLOBIN: 15.2 g/dL (ref 13.0–17.0)
MCH: 33.3 pg (ref 26.0–34.0)
MCHC: 33.9 g/dL (ref 30.0–36.0)
MCV: 98 fL (ref 78.0–100.0)
Platelets: 185 10*3/uL (ref 150–400)
RBC: 4.57 MIL/uL (ref 4.22–5.81)
RDW: 12.2 % (ref 11.5–15.5)
WBC: 10.5 10*3/uL (ref 4.0–10.5)

## 2015-03-01 LAB — I-STAT TROPONIN, ED: Troponin i, poc: 0.03 ng/mL (ref 0.00–0.08)

## 2015-03-01 LAB — BASIC METABOLIC PANEL
ANION GAP: 17 — AB (ref 5–15)
BUN: 30 mg/dL — ABNORMAL HIGH (ref 6–20)
CO2: 23 mmol/L (ref 22–32)
Calcium: 9.2 mg/dL (ref 8.9–10.3)
Chloride: 96 mmol/L — ABNORMAL LOW (ref 101–111)
Creatinine, Ser: 1.07 mg/dL (ref 0.61–1.24)
Glucose, Bld: 85 mg/dL (ref 65–99)
Potassium: 4.4 mmol/L (ref 3.5–5.1)
SODIUM: 136 mmol/L (ref 135–145)

## 2015-03-01 LAB — TSH: TSH: 0.06 u[IU]/mL — AB (ref 0.350–4.500)

## 2015-03-01 LAB — BRAIN NATRIURETIC PEPTIDE: B Natriuretic Peptide: 114.9 pg/mL — ABNORMAL HIGH (ref 0.0–100.0)

## 2015-03-01 MED ORDER — IPRATROPIUM-ALBUTEROL 0.5-2.5 (3) MG/3ML IN SOLN: 3.0000 mL | RESPIRATORY_TRACT | Status: DC | PRN

## 2015-03-01 MED ORDER — ASPIRIN EC 81 MG PO TBEC
81.0000 mg | DELAYED_RELEASE_TABLET | Freq: Every day | ORAL | Status: DC
  Administered 2015-03-02 – 2015-03-04 (×3): 81 mg via ORAL
  Filled 2015-03-01 (×3): qty 1

## 2015-03-01 MED ORDER — MELATONIN 1 MG PO CAPS: 2.0000 | ORAL_CAPSULE | Freq: Every day | ORAL | Status: DC

## 2015-03-01 MED ORDER — ENOXAPARIN SODIUM 40 MG/0.4ML ~~LOC~~ SOLN
40.0000 mg | SUBCUTANEOUS | Status: DC
  Administered 2015-03-01 – 2015-03-03 (×3): 40 mg via SUBCUTANEOUS
  Filled 2015-03-01 (×3): qty 0.4

## 2015-03-01 MED ORDER — METOPROLOL TARTRATE 12.5 MG HALF TABLET
12.5000 mg | ORAL_TABLET | Freq: Two times a day (BID) | ORAL | Status: DC
  Administered 2015-03-01 – 2015-03-02 (×2): 12.5 mg via ORAL
  Filled 2015-03-01 (×3): qty 1

## 2015-03-01 MED ORDER — ACETAMINOPHEN 650 MG RE SUPP
650.0000 mg | Freq: Four times a day (QID) | RECTAL | Status: DC | PRN
  Filled 2015-03-01: qty 1

## 2015-03-01 MED ORDER — SODIUM CHLORIDE 0.9 % IV BOLUS (SEPSIS)
1000.0000 mL | Freq: Once | INTRAVENOUS | Status: AC
  Administered 2015-03-01: 1000 mL via INTRAVENOUS

## 2015-03-01 MED ORDER — DOXYCYCLINE HYCLATE 100 MG PO TABS
100.0000 mg | ORAL_TABLET | Freq: Two times a day (BID) | ORAL | Status: DC
  Administered 2015-03-01 – 2015-03-04 (×7): 100 mg via ORAL
  Filled 2015-03-01 (×7): qty 1

## 2015-03-01 MED ORDER — ASPIRIN 325 MG PO TABS
650.0000 mg | ORAL_TABLET | Freq: Four times a day (QID) | ORAL | Status: DC | PRN
  Filled 2015-03-01: qty 2

## 2015-03-01 MED ORDER — PERMETHRIN 0.25 % LIQD
Freq: Once | Status: DC
  Filled 2015-03-01: qty 147.86

## 2015-03-01 MED ORDER — ONDANSETRON HCL 4 MG/2ML IJ SOLN: 4.0000 mg | Freq: Four times a day (QID) | INTRAMUSCULAR | Status: DC | PRN

## 2015-03-01 MED ORDER — SODIUM CHLORIDE 0.9 % IV SOLN
INTRAVENOUS | Status: AC
  Administered 2015-03-01: 17:00:00 via INTRAVENOUS

## 2015-03-01 MED ORDER — ALBUTEROL SULFATE (2.5 MG/3ML) 0.083% IN NEBU
5.0000 mg | INHALATION_SOLUTION | Freq: Once | RESPIRATORY_TRACT | Status: AC
  Administered 2015-03-01: 5 mg via RESPIRATORY_TRACT
  Filled 2015-03-01: qty 6

## 2015-03-01 MED ORDER — ENSURE ENLIVE PO LIQD
237.0000 mL | Freq: Two times a day (BID) | ORAL | Status: DC
  Administered 2015-03-01 – 2015-03-04 (×3): 237 mL via ORAL

## 2015-03-01 MED ORDER — ACETAMINOPHEN 325 MG PO TABS
650.0000 mg | ORAL_TABLET | Freq: Four times a day (QID) | ORAL | Status: DC | PRN
  Administered 2015-03-04: 650 mg via ORAL
  Filled 2015-03-01: qty 2

## 2015-03-01 MED ORDER — IPRATROPIUM-ALBUTEROL 0.5-2.5 (3) MG/3ML IN SOLN
3.0000 mL | Freq: Four times a day (QID) | RESPIRATORY_TRACT | Status: DC
  Administered 2015-03-01 – 2015-03-02 (×2): 3 mL via RESPIRATORY_TRACT
  Filled 2015-03-01 (×3): qty 3

## 2015-03-01 MED ORDER — ADULT MULTIVITAMIN W/MINERALS CH
1.0000 | ORAL_TABLET | Freq: Every day | ORAL | Status: DC
  Administered 2015-03-02 – 2015-03-04 (×3): 1 via ORAL
  Filled 2015-03-01 (×4): qty 1

## 2015-03-01 MED ORDER — PERMETHRIN 1 % EX LOTN
TOPICAL_LOTION | Freq: Once | CUTANEOUS | Status: DC
  Filled 2015-03-01: qty 59

## 2015-03-01 MED ORDER — METHYLPREDNISOLONE SODIUM SUCC 40 MG IJ SOLR
40.0000 mg | Freq: Two times a day (BID) | INTRAMUSCULAR | Status: DC
  Administered 2015-03-01 – 2015-03-02 (×2): 40 mg via INTRAVENOUS
  Filled 2015-03-01 (×2): qty 1

## 2015-03-01 MED ORDER — IPRATROPIUM BROMIDE 0.02 % IN SOLN
0.5000 mg | Freq: Once | RESPIRATORY_TRACT | Status: AC
  Administered 2015-03-01: 0.5 mg via RESPIRATORY_TRACT
  Filled 2015-03-01: qty 2.5

## 2015-03-01 MED ORDER — MELATONIN 3 MG PO TABS: 3.0000 mg | ORAL_TABLET | Freq: Every day | ORAL | Status: DC

## 2015-03-01 MED ORDER — GUAIFENESIN ER 600 MG PO TB12
1200.0000 mg | ORAL_TABLET | Freq: Two times a day (BID) | ORAL | Status: DC
  Administered 2015-03-01 – 2015-03-04 (×7): 1200 mg via ORAL
  Filled 2015-03-01 (×7): qty 2

## 2015-03-01 MED ORDER — MELATONIN 3 MG PO TABS
3.0000 mg | ORAL_TABLET | Freq: Every day | ORAL | Status: DC
  Administered 2015-03-01 – 2015-03-03 (×3): 3 mg via ORAL
  Filled 2015-03-01 (×5): qty 1

## 2015-03-01 MED ORDER — ATORVASTATIN CALCIUM 40 MG PO TABS
40.0000 mg | ORAL_TABLET | Freq: Every day | ORAL | Status: DC
  Administered 2015-03-01 – 2015-03-03 (×3): 40 mg via ORAL
  Filled 2015-03-01 (×3): qty 1

## 2015-03-01 MED ORDER — IPRATROPIUM-ALBUTEROL 0.5-2.5 (3) MG/3ML IN SOLN
3.0000 mL | RESPIRATORY_TRACT | Status: DC
  Administered 2015-03-01: 3 mL via RESPIRATORY_TRACT
  Filled 2015-03-01: qty 3

## 2015-03-01 MED ORDER — MAGNESIUM SULFATE 2 GM/50ML IV SOLN
2.0000 g | Freq: Once | INTRAVENOUS | Status: AC
  Administered 2015-03-01: 2 g via INTRAVENOUS
  Filled 2015-03-01: qty 50

## 2015-03-01 MED ORDER — TRAZODONE HCL 50 MG PO TABS
50.0000 mg | ORAL_TABLET | Freq: Every day | ORAL | Status: DC
  Administered 2015-03-01: 50 mg via ORAL
  Filled 2015-03-01: qty 1

## 2015-03-01 MED ORDER — SODIUM CHLORIDE 0.9 % IJ SOLN
3.0000 mL | Freq: Two times a day (BID) | INTRAMUSCULAR | Status: DC
  Administered 2015-03-01 (×2): 3 mL via INTRAVENOUS
  Administered 2015-03-02: 10 mL via INTRAVENOUS
  Administered 2015-03-03 (×2): 3 mL via INTRAVENOUS

## 2015-03-01 MED ORDER — BUDESONIDE 0.5 MG/2ML IN SUSP
0.5000 mg | Freq: Two times a day (BID) | RESPIRATORY_TRACT | Status: DC
  Administered 2015-03-01 – 2015-03-03 (×4): 0.5 mg via RESPIRATORY_TRACT
  Filled 2015-03-01 (×7): qty 2

## 2015-03-01 MED ORDER — ALUM & MAG HYDROXIDE-SIMETH 200-200-20 MG/5ML PO SUSP: 30.0000 mL | Freq: Four times a day (QID) | ORAL | Status: DC | PRN

## 2015-03-01 MED ORDER — PERMETHRIN 5 % EX CREA
TOPICAL_CREAM | Freq: Once | CUTANEOUS | Status: DC
  Filled 2015-03-01 (×2): qty 60

## 2015-03-01 MED ORDER — ONDANSETRON HCL 4 MG PO TABS
4.0000 mg | ORAL_TABLET | Freq: Four times a day (QID) | ORAL | Status: DC | PRN
  Administered 2015-03-04: 4 mg via ORAL
  Filled 2015-03-01: qty 1

## 2015-03-01 MED ORDER — ALBUTEROL SULFATE (2.5 MG/3ML) 0.083% IN NEBU
2.5000 mg | INHALATION_SOLUTION | RESPIRATORY_TRACT | Status: DC | PRN
  Administered 2015-03-02 – 2015-03-03 (×4): 2.5 mg via RESPIRATORY_TRACT
  Filled 2015-03-01 (×4): qty 3

## 2015-03-01 MED ORDER — POLYETHYLENE GLYCOL 3350 17 G PO PACK: 17.0000 g | PACK | Freq: Every day | ORAL | Status: DC | PRN

## 2015-03-01 NOTE — ED Provider Notes (Addendum)
CSN: [id number]     Arrival date & time 03/01/15  1950 History   First MD Initiated Contact with Patient 03/01/15 0701     Chief Complaint  Patient presents with  . Shortness of Breath  . Chest Pain     (Consider location/radiation/quality/duration/timing/severity/associated sxs/prior Treatment) HPI Comments: Patient has been seen 3 times this week for similar symptoms. He states that he is out of his Combivent inhaler at home but he is still been using albuterol. He is not currently taking prednisone at home. When EMS arrived patient was satting 79% on room air with diffuse wheezing. He was given albuterol, Atrovent, Solu-Medrol in route. Here patient is still complaining of shortness of breath with a nonproductive cough. He denies fever or infectious symptoms. He describes a tight sensation in his chest which accompanies the shortness of breath. He is not been able to eat or drink much because of his shortness of breath over the last few days.  Patient is a 64 y.o. male presenting with shortness of breath and chest pain. The history is provided by the patient.  Shortness of Breath Severity:  Severe Onset quality:  Gradual Duration:  3 days Timing:  Constant Progression:  Worsening Chronicity:  Chronic Context: weather changes   Context comment:  Patient has a history of COPD and is currently out of his Combivent inhaler but is using an albuterol inhaler without improvement. Relieved by:  Nothing Worsened by:  Activity Ineffective treatments: Albuterol inhaler. Associated symptoms: chest pain, cough and wheezing   Associated symptoms: no abdominal pain, no fever, no sputum production and no vomiting   Risk factors: no recent alcohol use, no hx of PE/DVT, no prolonged immobilization and no tobacco use   Chest Pain Associated symptoms: cough and shortness of breath   Associated symptoms: no abdominal pain, no fever and not vomiting     Past Medical History  Diagnosis Date  . COPD  (chronic obstructive pulmonary disease)   . HOH (hard of hearing)    Past Surgical History  Procedure Laterality Date  . Appendectomy    . Left heart catheterization with coronary angiogram N/A 11/07/2013    Procedure: LEFT HEART CATHETERIZATION WITH CORONARY ANGIOGRAM;  Surgeon: Clent Demark, MD;  Location: [facility] CATH LAB;  Service: Cardiovascular;  Laterality: N/A;  . Cardiac stents      No stent history   History reviewed. No pertinent family history. History  Substance Use Topics  . Smoking status: Current Every Day Smoker    Types: Cigarettes  . Smokeless tobacco: Never Used  . Alcohol Use: No    Review of Systems  Constitutional: Negative for fever.  Respiratory: Positive for cough, shortness of breath and wheezing. Negative for sputum production.   Cardiovascular: Positive for chest pain.  Gastrointestinal: Negative for vomiting and abdominal pain.  All other systems reviewed and are negative.     Allergies  Review of patient's allergies indicates no known allergies.  Home Medications   Prior to Admission medications   Medication Sig Start Date End Date Taking? Authorizing Provider  aspirin 325 MG tablet Take 650 mg by mouth every 6 (six) hours as needed for mild pain or headache.    Yes Historical Provider, MD  Melatonin 1 MG CAPS Take 2 capsules by mouth at bedtime.    Yes Historical Provider, MD  traZODone (DESYREL) 50 MG tablet Take 50 mg by mouth at bedtime.   Yes Historical Provider, MD  atorvastatin (LIPITOR) 80 MG tablet Take 1  tablet (80 mg total) by mouth at bedtime. Patient not taking: Reported on 10/22/2014 11/08/13   Ripudeep Krystal Eaton, MD  carvedilol (COREG) 3.125 MG tablet Take 1 tablet (3.125 mg total) by mouth 2 (two) times daily with a meal. Patient not taking: Reported on 10/10/2014 11/08/13   Ripudeep Krystal Eaton, MD  digoxin (LANOXIN) 0.125 MG tablet Take 1 tablet (0.125 mg total) by mouth daily. Patient not taking: Reported on 10/10/2014 11/08/13   Ripudeep K  Rai, MD   BP 122/90 mmHg  Pulse 96  Temp(Src) 98.1 F (36.7 C) (Oral)  Resp 17  SpO2 100% Physical Exam  Constitutional: He is oriented to person, place, and time. He appears well-developed. He appears cachectic. No distress.  HENT:  Head: Normocephalic and atraumatic.  Right Ear: External ear normal.  Left Ear: External ear normal.  Mouth/Throat: Oropharynx is clear and moist. Mucous membranes are dry.  Eyes: Conjunctivae and EOM are normal. Pupils are equal, round, and reactive to light. Right eye exhibits no discharge.  Neck: Normal range of motion. Neck supple.  Cardiovascular: Normal rate, regular rhythm, normal heart sounds and intact distal pulses.   No murmur heard. Pulmonary/Chest: Tachypnea noted. No respiratory distress. He has decreased breath sounds. He has wheezes. He has no rhonchi. He has no rales.  Abdominal: Soft. There is no tenderness.  Musculoskeletal: Normal range of motion. He exhibits no edema or tenderness.  Neurological: He is alert and oriented to person, place, and time.  Skin: Skin is warm and dry. Rash noted.  Excoriated lesions to the back of the neck and upper back. Maculopapular rash over the upper and lower extremities. Trunk is not involved  Psychiatric: He has a normal mood and affect.  Nursing note and vitals reviewed.   ED Course  Procedures (including critical care time) Labs Review Labs Reviewed  BASIC METABOLIC PANEL - Abnormal; Notable for the following:    Chloride 96 (*)    BUN 30 (*)    Anion gap 17 (*)    All other components within normal limits  CBC  BRAIN NATRIURETIC PEPTIDE  I-STAT TROPOININ, ED    Imaging Review Dg Chest Port 1 View  03/01/2015   CLINICAL DATA:  Shortness of breath and chest pain  EXAM: PORTABLE CHEST - 1 VIEW  COMPARISON:  Feb 24, 2015 and Feb 20, 2015  FINDINGS: Lungs are somewhat hyperexpanded but clear. The heart size and pulmonary vascularity are within normal limits. No adenopathy. No bone lesions.   IMPRESSION: No edema or consolidation.   Electronically Signed   By: Lowella Grip III M.D.   On: 03/01/2015 07:31     EKG Interpretation   Date/Time:  Friday March 01 2015 06:51:50 EDT Ventricular Rate:  96 PR Interval:  147 QRS Duration: 82 QT Interval:  341 QTC Calculation: 431 R Axis:   72 Text Interpretation:  Sinus rhythm Consider right atrial enlargement  Anteroseptal infarct, age indeterminate No significant change since last  tracing Confirmed by [facility]  MD, Loree Fee ([id number]) on 03/01/2015 7:48:26 AM      MDM   Final diagnoses:  COPD exacerbation   Patient with a history of COPD who presents today with worsening COPD exacerbation. He complains of shortness of breath and chest tightness that has been ongoing for the last 3 days since he left the emergency room. Patient has been in the emergency room 3 times the last 1 week. He no longer has a Combivent inhaler at home but has been using  the albuterol inhaler. He denies using any stereotactic this time. His by mouth intake is decreased due to the shortness of breath. Patient denies any fever or productive cough concerning for infectious etiology. Low suspicion that this is cardiac in nature. Patient was given Solu-Medrol, Atrovent, albuterol in route with improvement of his symptoms. Initial oxygen saturation was 79% on room air.  Patient has no signs of fluid overload today or symptoms concerning for PE.  Here patient has globally decreased breath sounds with mild wheezing still present. Dry mucous membranes but improved oxygen saturation now 98% on room air.  Patient also has a diffuse pruritic rash over the upper, lower extremities as well as the back of the neck which she states of been there for several months. Unlikely that this is medication related. He recently moved into a new place which could be the cause of where his rash is coming from. It does not look suggestive of scabies that could be bedbugs.  CBC, BMP, troponin,  chest x-ray, EKG pending as this is patient's third visit with similar symptoms in the last week feel that he needs admission for COPD exacerbation.   8:40 AM Labs without significant findings except for mild dehydration. On reexam patient's wheezing has resolved but still decreased air movement. Feel patient would benefit from hospitalization and further treatment for COPD exacerbation.  10:01 AM Attempted to ambulate the patient however he only made it to the door sats down to 87% having difficulty walking and very unsteady. Do not feel that patient is going to be able to go home. Did speak with social work who has spoken with his PCP and will arrange for him to have a [facility] neurology follow-up is currently patient is not on any therapy at home. Blanchie Dessert, MD 03/01/15 1937  Blanchie Dessert, MD 03/01/15 1003

## 2015-03-01 NOTE — ED Notes (Addendum)
Admitting NP at the bedside.

## 2015-03-01 NOTE — ED Notes (Signed)
Pt ambulated on pulse ox. Pt ambulated 10 feet to door with assistance, very unsteady gait. Pt stated that he could not ambulate farther into hallway. PT ambulated back to bed. Pt's o2 saturation stayed between 87-92% during ambulation. Pt's hr increased from 90-114bpm during ambulation. Pt returned to bed. Monitored by pulse ox, bp cuff, and 12-lead.

## 2015-03-01 NOTE — ED Notes (Signed)
Patient with shortness of breath and chest pain that started earlier today.  Patient with wheezing, diminished lung sounds.  Patient's initial room air saturation of 79%.  Patient's chest pain started with the shortness of breath.  Patient has had two neb treatments en route to ED, total of 10mg  albuterol and 0.5mg  atrovent and 125mg  solumedrol.  Patient has not had appetite and decreased fluid intake due to the shortness of breath.  Patient with bites (bedbugs) on back of neck.

## 2015-03-01 NOTE — Progress Notes (Signed)
Initial Nutrition Assessment  DOCUMENTATION CODES:  Non-severe (moderate) malnutrition in context of chronic illness  INTERVENTION:  Ensure Enlive (each supplement provides 350kcal and 20 grams of protein), Snacks, MVI  NUTRITION DIAGNOSIS:  Malnutrition related to chronic illness as evidenced by severe depletion of muscle mass, moderate depletion of body fat.   GOAL:  Patient will meet greater than or equal to 90% of their needs   MONITOR:  PO intake, Supplement acceptance, Labs, Weight trends, I & O's  REASON FOR ASSESSMENT:  Consult Assessment of nutrition requirement/status  ASSESSMENT: 64 y/o male with PMH significant for severe COPD, CHF (EF 20%), malnutrition and hearing loss; who presented to ED with worsening SOB and hypoxia.  Pt states that his appetite is good and he eats well. He ate about 50% of the food on his lunch tray; states it was too much. He is unable to explain his weight loss. He reports eating 2-3 meals daily PTA; foods such as cereal, grits, pancakes, sandwiches, potatoes, and hamburgers. He likes Ensure and Boost supplements but, he can not always afford them. RD emphasized the importance of nutrition and encouraged increased PO intake to promote weight gain. Encouraged intake of snacks in between meals and higher calorie beverages. Discussed ways to add calories to foods he mentioned eating often.   Labs reviewed.   Height:  Ht Readings from Last 1 Encounters:  03/01/15 5\' 9"  (1.753 m)    Weight:  Wt Readings from Last 1 Encounters:  01/14/15 111 lb (50.349 kg)    Ideal Body Weight:  72.7 kg  Wt Readings from Last 10 Encounters:  01/14/15 111 lb (50.349 kg)  12/29/14 111 lb 12.8 oz (50.712 kg)  11/20/14 115 lb (52.164 kg)  10/22/14 114 lb (51.71 kg)  11/15/13 111 lb (50.349 kg)  11/08/13 113 lb (51.256 kg)  11/01/13 130 lb (58.968 kg)  10/29/13 130 lb (58.968 kg)    BMI:  There is no weight on file to calculate BMI.  Estimated  Nutritional Needs:  Kcal:  1750-2000  Protein:  75-90 grams  Fluid:  1.7 L/day  Skin:  Reviewed, no issues  Diet Order:  Diet Heart Room service appropriate?: Yes; Fluid consistency:: Thin  EDUCATION NEEDS:  No education needs identified at this time   Intake/Output Summary (Last 24 hours) at 03/01/15 1342 Last data filed at 03/01/15 1226  Gross per 24 hour  Intake   1240 ml  Output      0 ml  Net   1240 ml    Last BM:  PTA   Pryor Ochoa RD, LDN Inpatient Clinical Dietitian Pager: [phone] After Hours Pager: [phone]

## 2015-03-01 NOTE — Clinical Social Work Note (Signed)
Clinical Social Work Assessment  Patient Details  Name: Anthony Bolton MRN: [id number] Date of Birth: 1950/12/28  Date of referral:  03/01/15               Reason for consult:   (COPD Gold Protocol)              Housing/Transportation Living arrangements for the past 2 months:  Apartment Source of Information:  Patient Patient Interpreter Needed:  None Criminal Activity/Legal Involvement Pertinent to Current Situation/Hospitalization:  No - Comment as needed Significant Relationships:  None Lives with:  Self Do you feel safe going back to the place where you live?  Yes Need for family participation in patient care:  No (Coment)  Care giving concerns:  None   Facilities manager / plan:  CSW met pt at bedside. CSW introduced self and purpose of visit. CSW and pt discussed the reason for COPD Gold Protrol. CSW completed both the PHQ-9 (depression questionnaire) and the GAD-7(anxiety questionnaire) with the pt. The pt provided complete answers to all questions. The pt scored a 1 on the GAD-7 and a 9(mild depression) on the PHQ-9.   Employment status:  Unemployed Forensic scientist:  Medicaid In [place] PT Recommendations:  Not assessed at this time Information / Referral to community resources:   (referred to PCP for a depression screening )  Patient/Family's Response to care:  Pt acknowledged the staff has been nice and attentive.   Patient/Family's Understanding of and Emotional Response to Diagnosis, Current Treatment, and Prognosis:  Pt acknowledged his COPD has been a burden on him. Pt acknowledged that his COPD makes it somewhat difficult to take care of things at home. Pt acknowledged that he activity walk and go to [employer]. Pt identified that he could have better control over his COPD so he can enjoy being more active.    Emotional Assessment Appearance:  Appears stated age Attitude/Demeanor/Rapport:   (Calm ) Affect (typically observed):  Accepting, Pleasant,  Appropriate Orientation:  Oriented to Self, Oriented to Place, Oriented to  Time, Oriented to Situation Alcohol / Substance use:  Not Applicable Psych involvement (Current and /or in the community):  No (Comment)  Discharge Needs  Concerns to be addressed:  Coping/Stress Concerns Readmission within the last 30 days:  No Current discharge risk:  Lack of support system, Lives alone Barriers to Discharge:  No Barriers Identified   [place], MSW, [place]

## 2015-03-01 NOTE — ED Notes (Signed)
Admitting MD at the bedside.  

## 2015-03-01 NOTE — Discharge Planning (Signed)
NCM consult regarding pulmonary follow up for pt.  Pt has appt with PCP Dr. Criss Rosales 6/16@1130 .  NCM spoke with RN at [facility] to refer pt to pulmonologist.  NCM relayed information to pt.

## 2015-03-01 NOTE — Progress Notes (Signed)
Patient arrived to 4N at 1125. Patient alert and oriented X4 but very hard of hearing. No c/o pain. Vital signs taken at charted. O2 and IVF.  Oriented to room with bed alarm on . Diet ordered. Noticed multiple small black bugs, some red, on patient clothing and linen and immediately placed patient in shower. Double bagged all linen and personal clothing. Environmental services notified of the situation. Pt. On contact precautions.

## 2015-03-01 NOTE — ED Notes (Signed)
PT given food and drink per MD Plunkett.

## 2015-03-01 NOTE — ED Notes (Signed)
This RN asked the admitting MD if pt needed to remain on contact precautions and he stated the pt did not. What he felt we thought were bed bug bites were areas of dry skin the pt had been scratching at.

## 2015-03-01 NOTE — H&P (Signed)
[facility] History and Physical                                                                                    Anthony Bolton, is a 64 y.o. male  MRN: [id number]   DOB - 11-27-1950  Admit Date - 03/01/2015  Outpatient Primary MD for the patient is [name], MD  Cardiology: Dr. Terrence Dupont  Referring Physician:  Dr. Maryan Rued  Chief Complaint:   Chief Complaint  Patient presents with  . Shortness of Breath  . Chest Pain     HPI  Anthony Bolton  is a 64 y.o. male, with a PMH of severe COPD, CHF (EF 20%), malnutrition and hearing loss presented to the ER 6/3 with SOB and cough causing CP.  He is very hard of hearing which makes history gathering a little challenging.  He reports that he has been in bed for two days unable to get up due to weakness and SOB.  He has been coughing up sputum that has been yellow but today is white.  When he coughs he has pain in his chest that he rates an 8.  Anthony Bolton was last admitted to [facility] in February 2016, but has had multiple visits to the ER since discharge including 3 visits this week.  He states he is out of all of his medications at home.  Per Epic he has not seen pulmonology despite the ER arranging an appointment for him in March 2016.   In the ER 6/3 he received mag sulfate, fluids and nebulizers but still desatted to 87% on room air and became dyspneic with walking short distances.  His labs show dehydration. BNP is not elevated.  POC Troponin is WNL.  The ER RN found several small insects on the patient (beg bugs?).  Review of Systems   In addition to the HPI above,  No Fever-chills, No Headache, No changes with Vision or hearing, No problems swallowing food or Liquids, No Abdominal pain, No Nausea or Vomiting, Bowel movements are regular, No Blood in stool or Urine, No dysuria, No new skin rashes or bruises, No new joints pains-aches,  No new weakness, tingling, numbness in any extremity,  A full 10 point Review of Systems was  done, except as stated above, all other Review of Systems were negative.  Past Medical History  Past Medical History  Diagnosis Date  . COPD (chronic obstructive pulmonary disease)   . HOH (hard of hearing)   . CHF (congestive heart failure) EF 20 - 25% in 2015    Past Surgical History  Procedure Laterality Date  . Appendectomy    . Left heart catheterization with coronary angiogram N/A 11/07/2013    Procedure: LEFT HEART CATHETERIZATION WITH CORONARY ANGIOGRAM;  Surgeon: Clent Demark, MD;  Location: [facility] CATH LAB;  Service: Cardiovascular;  Laterality: N/A;  . Cardiac stents      No stent history    Social History History  Substance Use Topics  . Smoking status: Current Every Day Smoker    Types: Cigarettes  . Smokeless tobacco: Never Used  . Alcohol Use: No    Family History  His father and grandfather  were tobacco farmers and had breathing difficulties.  Prior to Admission medications   Medication Sig Start Date End Date Taking? Authorizing Provider  aspirin 325 MG tablet Take 650 mg by mouth every 6 (six) hours as needed for mild pain or headache.    Yes Historical Provider, MD  Melatonin 1 MG CAPS Take 2 capsules by mouth at bedtime.    Yes Historical Provider, MD  traZODone (DESYREL) 50 MG tablet Take 50 mg by mouth at bedtime.   Yes Historical Provider, MD  atorvastatin (LIPITOR) 80 MG tablet Take 1 tablet (80 mg total) by mouth at bedtime. Patient not taking: Reported on 10/22/2014 11/08/13   Ripudeep Krystal Eaton, MD  carvedilol (COREG) 3.125 MG tablet Take 1 tablet (3.125 mg total) by mouth 2 (two) times daily with a meal. Patient not taking: Reported on 10/10/2014 11/08/13   Ripudeep Krystal Eaton, MD  digoxin (LANOXIN) 0.125 MG tablet Take 1 tablet (0.125 mg total) by mouth daily. Patient not taking: Reported on 10/10/2014 11/08/13   Ripudeep Krystal Eaton, MD    No Known Allergies  Physical Exam  Vitals  Blood pressure 106/70, pulse 84, temperature 98.1 F (36.7 C), temperature  source Oral, resp. rate 13, SpO2 94 %.   General:  Cachetic, dis shelved, male, very hard of hearing, lying in bed in NAD  Psych:  Normal affect and insight, Not Suicidal or Homicidal, Awake Alert, Oriented X 3.  Neuro:   No F.N deficits, ALL C.Nerves Intact, Strength 5/5 all 4 extremities, Sensation intact all 4 extremities.  ENT:  Ears and Eyes appear Normal, Conjunctivae clear, PER. Dry MM, Very hard of hearing.  Neck:  Supple, No lymphadenopathy appreciated  Respiratory:  Poor air movement.  Tight, no w/c/r.  No accessory muscle use.  Cardiac:  RRR, No Murmurs, no LE edema noted, no JVD.    Abdomen:  Positive bowel sounds, Soft, Non tender, Non distended,  No masses appreciated  Skin:  No Cyanosis, Normal Skin Turgor, No Skin Rash or Bruise.  Dry Skin.    Extremities:  Able to move all 4. 5/5 strength in each,  no effusions.  Data Review  CBC  Recent Labs Lab 02/24/15 0007 03/01/15 0736  WBC 6.7 10.5  HGB 12.5* 15.2  HCT 38.2* 44.8  PLT 196 185  MCV 98.7 98.0  MCH 32.3 33.3  MCHC 32.7 33.9  RDW 12.3 12.2  LYMPHSABS 1.2  --   MONOABS 0.6  --   EOSABS 0.5  --   BASOSABS 0.1  --     Chemistries   Recent Labs Lab 02/24/15 0007 03/01/15 0736  NA 136 136  K 3.7 4.4  CL 100* 96*  CO2 28 23  GLUCOSE 107* 85  BUN 12 30*  CREATININE 0.73 1.07  CALCIUM 8.9 9.2   Cardiac Enzymes  Recent Labs Lab 02/24/15 0007 02/24/15 0157  TROPONINI <0.03 <0.03     Imaging results:   Dg Chest Port 1 View  03/01/2015   CLINICAL DATA:  Shortness of breath and chest pain  EXAM: PORTABLE CHEST - 1 VIEW  COMPARISON:  Feb 24, 2015 and Feb 20, 2015  FINDINGS: Lungs are somewhat hyperexpanded but clear. The heart size and pulmonary vascularity are within normal limits. No adenopathy. No bone lesions.  IMPRESSION: No edema or consolidation.   Electronically Signed   By: Lowella Grip III M.D.   On: 03/01/2015 07:31   Dg Chest Port 1 View  02/24/2015   CLINICAL DATA:  Acute onset of shortness of breath and generalized chest pain. Initial encounter.  EXAM: PORTABLE CHEST - 1 VIEW  COMPARISON:  Chest radiograph performed 02/20/2015  FINDINGS: The lungs are well-aerated. Minimal right lower lung zone airspace opacity may reflect mild pneumonia. There is no evidence of pleural effusion or pneumothorax.  The cardiomediastinal silhouette is within normal limits. No acute osseous abnormalities are seen. There is chronic fragmentation involving the distal left clavicle.  IMPRESSION: Minimal right lower lung zone airspace opacity may reflect mild pneumonia.   Electronically Signed   By: Garald Balding M.D.   On: 02/24/2015 00:23   Dg Chest Port 1 View  02/20/2015   CLINICAL DATA:  Shortness of breath.  COPD.  EXAM: PORTABLE CHEST - 1 VIEW  COMPARISON:  01/14/2015  FINDINGS: Cardiomediastinal silhouette is within normal limits. The lungs are hyperinflated consistent with COPD. No airspace consolidation, edema, pleural effusion, or pneumothorax is identified. No acute osseous abnormality is seen.  IMPRESSION: COPD without evidence of acute airspace disease.   Electronically Signed   By: Logan Bores   On: 02/20/2015 20:10    My personal review of EKG: Sinus Rhythm.  QT not prolonged.   Assessment & Plan  Principal Problem:   COPD exacerbation Active Problems:   Infestation by bed bug   Hyperthyroidism   Protein-calorie malnutrition, severe   CHF (congestive heart failure)  COPD Exacerbation Will treat with Nebs, Solumedrol, flutter valve, Doxy, pulmicort,  Flutter valve, mucinex Many ER visits.  Admitted under COPD Gold protocol which comes with a pulm consult. Patient was out of all medications - he has no maintenance medications No longer smokes. Have requested Case Management to help with medications, nebulizer equipment, and follow up appts.  Systolic CHF Patient is currently dry.  BNP is not elevated. Sees Dr. Terrence Dupont Last 2D echo 2014.  Will update.  His  LVEF was 20% with  Hypokinesis.  I believe he may have a degree of pulmonary HTN. And may need oxygen at home. I&O, Low salt diet, daily weight. Patient was on Coreg 3.125 and Digoxin, but not taking it. Will start metoprolol 12.5 bid.  Consider discussing CHF medications with Dr. Terrence Dupont or scheduling follow up outpatient.  Dehydration Gentle IVF x 12 hours.  Ischemic heart disease Continue aspirin 325 mg and restart lipitor at 40 mg rather than 80 mg Will check a fasting lipid panel in am.  Hx of hyperthyroidism listed. Will check TSH.  Social Patient is dirty, cachectic, has bed bugs, and numerous ER visits.  I believe he may be unable to care for himself at home alone. Will ask social work to offer resources / consider best disposition to offer Mr. Baskett.  Bed Bugs Treating with Permethin.  Consultants Called:  Pulmonology  Family Communication:   Attempted to call son, Camillia Herter, unfortunately his voide mail was full.  Code Status:  Full "try one time"  Condition:  Guarded.    Potential Disposition: To be determined. Anticipate 48 - 72 hour stay.  Time spent in minutes : [address]  PA-C on 03/01/2015 at 10:50 AM Between 7am to 7pm - Pager - [phone] After 7pm go to [email or web address] - password [email or web address] And look for the night coverage person covering me after hours  [facility]

## 2015-03-01 NOTE — ED Notes (Signed)
Pt requesting coffee

## 2015-03-01 NOTE — Consult Note (Signed)
Name: Anthony Bolton MRN: [id number] DOB: Feb 19, 1951    ADMISSION DATE:  03/01/2015 CONSULTATION DATE:  6/3  REFERRING MD :  Dyann Kief   CHIEF COMPLAINT:  COPD and frequent admits  BRIEF PATIENT DESCRIPTION:  64 year old male w/ likely GOLD D COPD and systolic HF w/ EF 36%. Admitted on 6/3 w/ working dx of AECOPD. This was his third ER visit in two weeks after running out of combivent. PCCM asked to assist w/ care. His room air sats were 86%  >of note several small insects noted at admit (felt to be bedbugs)  SIGNIFICANT EVENTS    STUDIES:     HISTORY OF PRESENT ILLNESS:   This is a 64 year old male who was admitted by the med service on 6/3 w/ CC: dyspnea and cough. At baseline he has EF 20% and what is likely GOLD D COPD. Pt very hard of haring w/ what seems like significant cognitive delays.  Presented to the ED on 6/3 w/ approx 1 week h/o progressive dyspnea, cough w/ yellow to white sputum, and associated CP w/ cough. Denies sick exposures, fever, chills. States was doing "fine" until he ran out of combivent two weeks prior (since this time has been to ER twice before this admit). He was admitted w/ AECOPD. PCCM asked to assist w/ rx.   PAST MEDICAL HISTORY :   has a past medical history of COPD (chronic obstructive pulmonary disease); HOH (hard of hearing); and CHF (congestive heart failure) (EF 20 - 25% in 2015).  has past surgical history that includes Appendectomy; left heart catheterization with coronary angiogram (N/A, 11/07/2013); and cardiac stents. Prior to Admission medications   Medication Sig Start Date End Date Taking? Authorizing Provider  aspirin 325 MG tablet Take 650 mg by mouth every 6 (six) hours as needed for mild pain or headache.    Yes Historical Provider, MD  Melatonin 1 MG CAPS Take 2 capsules by mouth at bedtime.    Yes Historical Provider, MD  traZODone (DESYREL) 50 MG tablet Take 50 mg by mouth at bedtime.   Yes Historical Provider, MD  atorvastatin  (LIPITOR) 80 MG tablet Take 1 tablet (80 mg total) by mouth at bedtime. Patient not taking: Reported on 10/22/2014 11/08/13   Ripudeep Krystal Eaton, MD  carvedilol (COREG) 3.125 MG tablet Take 1 tablet (3.125 mg total) by mouth 2 (two) times daily with a meal. Patient not taking: Reported on 10/10/2014 11/08/13   Ripudeep Krystal Eaton, MD  digoxin (LANOXIN) 0.125 MG tablet Take 1 tablet (0.125 mg total) by mouth daily. Patient not taking: Reported on 10/10/2014 11/08/13   Ripudeep Krystal Eaton, MD   No Known Allergies  FAMILY HISTORY:  family history is not on file. SOCIAL HISTORY:  reports that he has been smoking Cigarettes.  He has never used smokeless tobacco. He reports that he does not drink alcohol or use illicit drugs.  REVIEW OF SYSTEMS:   Constitutional: Negative for fever, chills, weight loss, malaise/fatigue and diaphoresis.  HENT: Negative for hearing loss, ear pain, nosebleeds, congestion, sore throat, neck pain, tinnitus and ear discharge.   Eyes: Negative for blurred vision, double vision, photophobia, pain, discharge and redness.  Respiratory: Negative for cough, hemoptysis, sputum production, shortness of breath, wheezing and stridor.   Cardiovascular: chest pain w/ cough, palpitations, orthopnea, claudication, leg swelling and PND.  Gastrointestinal: Negative for heartburn, nausea, vomiting, abdominal pain, diarrhea, constipation, blood in stool and melena.  Genitourinary: Negative for dysuria, urgency, frequency, hematuria  and flank pain.  Musculoskeletal: Negative for myalgias, back pain, joint pain and falls.  Skin: Negative for itching and rash.  Neurological: Negative for dizziness, tingling, tremors, sensory change, speech change, focal weakness, seizures, loss of consciousness, weakness and headaches.  Endo/Heme/Allergies: Negative for environmental allergies and polydipsia. Does not bruise/bleed easily.  SUBJECTIVE: resting comfortable   VITAL SIGNS: Temp:  [97.7 F (36.5 C)-98.1 F  (36.7 C)] 97.7 F (36.5 C) (06/03 1223) Pulse Rate:  [83-96] 84 (06/03 1223) Resp:  [12-23] 16 (06/03 1223) BP: (100-179)/(69-132) 115/88 mmHg (06/03 1223) SpO2:  [92 %-100 %] 95 % (06/03 1223)  PHYSICAL EXAMINATION: General:  Frail, disheveled 64 year old male, in no acute distress.  Neuro:  Very hard of hearing. Has difficulty providing history  HEENT:  Cutter, no JVD  Cardiovascular:  rrr Lungs:  Decreased t/o  Abdomen:  Soft, + bowel sounds Musculoskeletal:  Intact  Skin:  intact   Recent Labs Lab 02/24/15 0007 03/01/15 0736  NA 136 136  K 3.7 4.4  CL 100* 96*  CO2 28 23  BUN 12 30*  CREATININE 0.73 1.07  GLUCOSE 107* 85    Recent Labs Lab 02/24/15 0007 03/01/15 0736  HGB 12.5* 15.2  HCT 38.2* 44.8  WBC 6.7 10.5  PLT 196 185   Dg Chest Port 1 View  03/01/2015   CLINICAL DATA:  Shortness of breath and chest pain  EXAM: PORTABLE CHEST - 1 VIEW  COMPARISON:  Feb 24, 2015 and Feb 20, 2015  FINDINGS: Lungs are somewhat hyperexpanded but clear. The heart size and pulmonary vascularity are within normal limits. No adenopathy. No bone lesions.  IMPRESSION: No edema or consolidation.   Electronically Signed   By: Lowella Grip III M.D.   On: 03/01/2015 07:31    ASSESSMENT / PLAN:  Acute on Chronic hypoxic respiratory failure in the setting of AECOPD. Think that this is more chronic than acute and likely d/t him running out of his medication. Seems as though his social barriers such as: smoking and being able to purchase his meds and poor hygiene are playing a significant role in his health.  Plan Cont nebulized bronchodilators - dosing schedule adjusted Cont methylprednisolone IV X 3 days, then pred 60 mg daily X 3, 40 mg daily X 3, 20 mg daily X 3, stop  DC regimen should be either ICS/LABA such as Symbicort or Spiriva as maintenance therapy and albuterol as rescue therapy Complete 5 days doxycycline Smoking cessation!!! - all else is rendered futile if he continues to  smoke Assess O2 needs prior to discharge Have scheduled him w/ Dr Melvyn Novas June 17th at [id number]   Systolic CM, Bedbugs, protein calorie malnutrition, hyperthyroidism Plan - Per IM service.  - Would benefit from assisted living situation.    PCCM will sign off. Please call if we can be of further assistance  Merton Border, MD ; [facility] [phone].  After 5:30 PM or weekends, call [phone]   03/01/2015, 2:10 PM

## 2015-03-01 NOTE — Care Management Note (Signed)
Case Management Note  Patient Details  Name: Anthony Bolton MRN: [id number] Date of Birth: 05-Oct-1950  Subjective/Objective:                    Action/Plan:  Consult noted for COPD Gold Protocol.  Message was sent via inbasket to pulmonary scheduling for follow-up at discharge per request of [facility].  Email was sent to Barrie Dunker to notify of COPD Gold status.  Consult also noted for medication assistance. Patient has prescription drug coverage through Medicaid.  Please send patient's remaining inhalers with him at discharge to assist with medication accessibility.  CM will continue to follow for additional discharge needs as determined throughout the course of admission. Expected Discharge Date:                  Expected Discharge Plan:  [place]  In-House Referral:  Clinical Social Work  Discharge planning Services  CM Consult  Post Acute Care Choice:    Choice offered to:     DME Arranged:    DME Agency:     HH Arranged:    [place] Agency:     Status of Service:  In process, will continue to follow  Medicare Important Message Given:    Date Medicare IM Given:    Medicare IM give by:    Date Additional Medicare IM Given:    Additional Medicare Important Message give by:     If discussed at [place] of Stay Meetings, dates discussed:    Additional Comments:  Rolm Baptise, RN 03/01/2015, 3:03 PM

## 2015-03-02 ENCOUNTER — Inpatient Hospital Stay (HOSPITAL_COMMUNITY): Payer: Medicaid Other

## 2015-03-02 DIAGNOSIS — E440 Moderate protein-calorie malnutrition: Secondary | ICD-10-CM | POA: Insufficient documentation

## 2015-03-02 LAB — COMPREHENSIVE METABOLIC PANEL
ALT: 19 U/L (ref 17–63)
AST: 26 U/L (ref 15–41)
Albumin: 3.2 g/dL — ABNORMAL LOW (ref 3.5–5.0)
Alkaline Phosphatase: 41 U/L (ref 38–126)
Anion gap: 12 (ref 5–15)
BILIRUBIN TOTAL: 0.4 mg/dL (ref 0.3–1.2)
BUN: 20 mg/dL (ref 6–20)
CALCIUM: 8.3 mg/dL — AB (ref 8.9–10.3)
CO2: 22 mmol/L (ref 22–32)
CREATININE: 0.77 mg/dL (ref 0.61–1.24)
Chloride: 100 mmol/L — ABNORMAL LOW (ref 101–111)
Glucose, Bld: 180 mg/dL — ABNORMAL HIGH (ref 65–99)
POTASSIUM: 4.4 mmol/L (ref 3.5–5.1)
Sodium: 134 mmol/L — ABNORMAL LOW (ref 135–145)
Total Protein: 5.4 g/dL — ABNORMAL LOW (ref 6.5–8.1)

## 2015-03-02 LAB — CBC
HCT: 36.9 % — ABNORMAL LOW (ref 39.0–52.0)
HEMOGLOBIN: 12.5 g/dL — AB (ref 13.0–17.0)
MCH: 32.6 pg (ref 26.0–34.0)
MCHC: 33.9 g/dL (ref 30.0–36.0)
MCV: 96.1 fL (ref 78.0–100.0)
Platelets: 141 10*3/uL — ABNORMAL LOW (ref 150–400)
RBC: 3.84 MIL/uL — ABNORMAL LOW (ref 4.22–5.81)
RDW: 12.1 % (ref 11.5–15.5)
WBC: 11.3 10*3/uL — ABNORMAL HIGH (ref 4.0–10.5)

## 2015-03-02 LAB — LIPID PANEL
Cholesterol: 194 mg/dL (ref 0–200)
HDL: 45 mg/dL (ref >40)
LDL Cholesterol: 137 mg/dL — ABNORMAL HIGH (ref 0–99)
TRIGLYCERIDES: 58 mg/dL (ref <150)
Total CHOL/HDL Ratio: 4.3 RATIO
VLDL: 12 mg/dL (ref 0–40)

## 2015-03-02 MED ORDER — SODIUM CHLORIDE 0.9 % IV BOLUS (SEPSIS)
500.0000 mL | Freq: Once | INTRAVENOUS | Status: AC
  Administered 2015-03-02: 500 mL via INTRAVENOUS

## 2015-03-02 MED ORDER — PREDNISONE 20 MG PO TABS
40.0000 mg | ORAL_TABLET | Freq: Two times a day (BID) | ORAL | Status: DC
  Administered 2015-03-02 – 2015-03-04 (×4): 40 mg via ORAL
  Filled 2015-03-02 (×4): qty 2

## 2015-03-02 MED ORDER — TRAZODONE HCL 50 MG PO TABS
50.0000 mg | ORAL_TABLET | Freq: Every evening | ORAL | Status: DC | PRN
  Administered 2015-03-02 – 2015-03-03 (×2): 50 mg via ORAL
  Filled 2015-03-02 (×2): qty 1

## 2015-03-02 MED ORDER — IPRATROPIUM-ALBUTEROL 0.5-2.5 (3) MG/3ML IN SOLN
3.0000 mL | Freq: Two times a day (BID) | RESPIRATORY_TRACT | Status: DC
  Administered 2015-03-02: 3 mL via RESPIRATORY_TRACT
  Filled 2015-03-02 (×2): qty 3

## 2015-03-02 MED ORDER — SODIUM CHLORIDE 0.9 % IV BOLUS (SEPSIS)
250.0000 mL | Freq: Once | INTRAVENOUS | Status: AC
  Administered 2015-03-02: 250 mL via INTRAVENOUS

## 2015-03-02 NOTE — Evaluation (Signed)
Physical Therapy Evaluation Patient Details Name: Anthony Bolton MRN: [id number] DOB: May 31, 1951 Today's Date: 03/02/2015   History of Present Illness  Patient is a 64 yo widowed male with multiple ED visits secondary to SOB, cough and fatigue.  He presents with limited mobility.  Clinical Impression  Patient agitated with therapist upon arrival and he threw covers off in anger.  He was curled up in fetal position upon entering room.  He has limited social support upon discharge and he reports that his finances do not cover most of his living needs through the month. Activity today limited by patient's agitation and feeling "weak" upon standing.  Positive orthostatics with incr HR upon standing and symptoms.  Patient may benefit from short term SNF stay to improve mobility prior to return home.  Suspect that [facility] may be good long term solution for patient in order to monitor him medically and home situation.  Patient may benefit from further skilled PT to improve mobility, reduce fall risk and incr functional independence. Therapy will continue to follow to assist with discharge planning and follow up recommendations.     Follow Up Recommendations SNF;Supervision/Assistance - 24 hour    Equipment Recommendations  Cane    Recommendations for Other Services       Precautions / Restrictions Precautions Precautions: Fall Precaution Comments: HOH, no teeth Restrictions Weight Bearing Restrictions: No      Mobility  Bed Mobility Overal bed mobility: Independent             General bed mobility comments: enter/exit bed from left side  Transfers Overall transfer level: Needs assistance Equipment used: None Transfers: Sit to/from Stand Sit to Stand: Supervision         General transfer comment: felt SOB after standing prolonged period of time  Ambulation/Gait             General Gait Details: not assessed secondary to contact precautions and patient's  agitation  Stairs            Wheelchair Mobility    Modified Rankin (Stroke Patients Only)       Balance Overall balance assessment: Needs assistance Sitting-balance support: No upper extremity supported;Feet supported Sitting balance-Leahy Scale: Good     Standing balance support: Single extremity supported Standing balance-Leahy Scale: Fair Standing balance comment: pt held to IV pole during BP readings                             Pertinent Vitals/Pain Pain Assessment: 0-10 Pain Score: 0-No pain    Home Living Family/patient expects to be discharged to:: Private residence Living Arrangements: Alone Available Help at Discharge:  (none) Type of Home: House Home Access: Stairs to enter Entrance Stairs-Rails: Left Entrance Stairs-Number of Steps: 7 Home Layout: One level Home Equipment: None Additional Comments: takes cab or bus to store monthly for grocery shopping    Prior Function Level of Independence: Independent         Comments: cooks simple meals in microwave and stovetop     Hand Dominance   Dominant Hand: Right    Extremity/Trunk Assessment                      Cervical / Trunk Assessment: Kyphotic  Communication   Communication: HOH  Cognition Arousal/Alertness: Awake/alert Behavior During Therapy: Agitated Overall Cognitive Status: Within Functional Limits for tasks assessed  General Comments General comments (skin integrity, edema, etc.): very thin man    Exercises        Assessment/Plan    PT Assessment Patient needs continued PT services  PT Diagnosis Generalized weakness   PT Problem List Decreased strength;Decreased activity tolerance;Decreased balance;Decreased mobility;Decreased knowledge of use of DME;Decreased safety awareness;Cardiopulmonary status limiting activity  PT Treatment Interventions DME instruction;Gait training;Stair training;Therapeutic  activities;Functional mobility training;Therapeutic exercise;Balance training;Patient/family education   PT Goals (Current goals can be found in the Care Plan section) Acute Rehab PT Goals Patient Stated Goal: to be left alone PT Goal Formulation: With patient Time For Goal Achievement: 03/16/15 Potential to Achieve Goals: Fair    Frequency Min 3X/week   Barriers to discharge Decreased caregiver support pt reports finances do not cover all his needs through month    Co-evaluation               End of Session   Activity Tolerance: Treatment limited secondary to agitation Patient left: in bed;with call bell/phone within reach;with bed alarm set Nurse Communication: Mobility status         Time: 2440-1027 PT Time Calculation (min) (ACUTE ONLY): 41 min   Charges:     PT Treatments $Therapeutic Activity: 8-22 mins   PT G CodesMalka Bolton, [place] [phone]  Anthony Bolton 03/02/2015, 10:41 AM

## 2015-03-02 NOTE — Progress Notes (Addendum)
[facility] PROGRESS NOTE  Anthony Bolton YQI:[id number] DOB: 07-02-1951 DOA: 03/01/2015 PCP: [name], MD  Assessment/Plan:  Principal Problem:   COPD exacerbation: improved. Change steroids to po. Continue nebs, doxy. Off oxygen Active Problems:   Abnormal TSH: check FT4, FT3   Protein-calorie malnutrition, severe   History of chronic systolic CHF (congestive heart failure), compensated. EF 20-25% a year ago   Infestation by bed bug: permethrin not indicated will d/c Blood pressures borderline. D/c metoprolol and bolus saline Extremely hearing impaired. Makes communication difficult  HPI/Subjective: Feels week. Denies dyspnea. Cant get much other history  Objective: Filed Vitals:   03/02/15 0630  BP: 88/71  Pulse: 54  Temp: 97.9 F (36.6 C)  Resp: 20    Intake/Output Summary (Last 24 hours) at 03/02/15 1025 Last data filed at 03/01/15 2121  Gross per 24 hour  Intake    240 ml  Output    200 ml  Net     40 ml   Filed Weights   03/02/15 0106  Weight: 50 kg (110 lb 3.7 oz)    Exam:   General:  Asleep, arousable. Difficult communication due to extreme [facility]  Cardiovascular: RRR without MGR  Respiratory: CTA without WRR, diminished throughout. Breathing nonlabored  Abdomen: S, NT, ND  Ext: no CCE  Basic Metabolic Panel:  Recent Labs Lab 02/24/15 0007 03/01/15 0736 03/02/15 0558  NA 136 136 134*  K 3.7 4.4 4.4  CL 100* 96* 100*  CO2 28 23 22   GLUCOSE 107* 85 180*  BUN 12 30* 20  CREATININE 0.73 1.07 0.77  CALCIUM 8.9 9.2 8.3*   Liver Function Tests:  Recent Labs Lab 03/02/15 0558  AST 26  ALT 19  ALKPHOS 41  BILITOT 0.4  PROT 5.4*  ALBUMIN 3.2*   No results for input(s): LIPASE, AMYLASE in the last 168 hours. No results for input(s): AMMONIA in the last 168 hours. CBC:  Recent Labs Lab 02/24/15 0007 03/01/15 0736 03/02/15 0558  WBC 6.7 10.5 11.3*  NEUTROABS 4.3  --   --   HGB 12.5* 15.2 12.5*  HCT 38.2* 44.8 36.9*   MCV 98.7 98.0 96.1  PLT 196 185 141*   Cardiac Enzymes:  Recent Labs Lab 02/24/15 0007 02/24/15 0157  TROPONINI <0.03 <0.03   BNP (last 3 results)  Recent Labs  01/09/15 1540 02/20/15 1927 03/01/15 0736  BNP 86.2 86.2 114.9*    ProBNP (last 3 results) No results for input(s): PROBNP in the last 8760 hours.  CBG: No results for input(s): GLUCAP in the last 168 hours.  No results found for this or any previous visit (from the past 240 hour(s)).   Studies: Dg Chest Port 1 View  03/02/2015   CLINICAL DATA:  Pneumonia.  EXAM: PORTABLE CHEST - 1 VIEW  COMPARISON:  03/01/2015  FINDINGS: Cardiac silhouette normal in size and configuration. No mediastinal or hilar masses or evidence of adenopathy.  Lungs are hyperexpanded. There is mild interstitial thickening most evident in the lung bases. Relative lucency in the upper lobes is consistent with emphysema. These findings are stable. There is no lung consolidation or edema. No pleural effusion or pneumothorax.  Bony thorax is grossly intact.  IMPRESSION: 1. No acute cardiopulmonary disease. 2. COPD.   Electronically Signed   By: Lajean Manes M.D.   On: 03/02/2015 09:36   Dg Chest Port 1 View  03/01/2015   CLINICAL DATA:  Shortness of breath and chest pain  EXAM: PORTABLE CHEST - 1  VIEW  COMPARISON:  Feb 24, 2015 and Feb 20, 2015  FINDINGS: Lungs are somewhat hyperexpanded but clear. The heart size and pulmonary vascularity are within normal limits. No adenopathy. No bone lesions.  IMPRESSION: No edema or consolidation.   Electronically Signed   By: Lowella Grip III M.D.   On: 03/01/2015 07:31    Scheduled Meds: . aspirin EC  81 mg Oral Daily  . atorvastatin  40 mg Oral QHS  . budesonide (PULMICORT) nebulizer solution  0.5 mg Nebulization BID  . doxycycline  100 mg Oral Q12H  . enoxaparin (LOVENOX) injection  40 mg Subcutaneous Q24H  . feeding supplement (ENSURE ENLIVE)  237 mL Oral BID BM  . guaiFENesin  1,200 mg Oral BID  .  ipratropium-albuterol  3 mL Nebulization BID  . Melatonin  3 mg Oral QHS  . methylPREDNISolone (SOLU-MEDROL) injection  40 mg Intravenous Q12H  . metoprolol tartrate  12.5 mg Oral BID  . multivitamin with minerals  1 tablet Oral Daily  . permethrin   Topical Once  . permethrin   Topical Once  . sodium chloride  3 mL Intravenous Q12H  . traZODone  50 mg Oral QHS   Continuous Infusions:   Time spent: 25 minutes  [facility] Pager [phone] [email or web address], password [facility] 03/02/2015, 10:25 AM  LOS: 1 day

## 2015-03-02 NOTE — Evaluation (Addendum)
Occupational Therapy Evaluation Patient Details Name: Anthony Bolton MRN: [id number] DOB: 1951/04/18 Today's Date: 03/02/2015    History of Present Illness Patient is a 64 y.o. widowed male with multiple ED visits secondary to SOB, cough and fatigue.     Clinical Impression   Pt admitted with above. Evaluation limited due to pt refusing to do more (reported he was cold and tired). Recomending SNF for rehab.     Follow Up Recommendations  SNF;Supervision/Assistance - 24 hour    Equipment Recommendations  Other (comment) (defer to next venue)    Recommendations for Other Services       Precautions / Restrictions Precautions Precautions: Fall Precaution Comments: HOH Restrictions Weight Bearing Restrictions: No      Mobility Bed Mobility Overal bed mobility: Independent                Transfers                 General transfer comment: pt refused    Balance    Sat EOB with no LOB. Balance not formally assessed.                                        ADL Overall ADL's : Needs assistance/impaired     Grooming: Wash/dry face;Set up;Sitting           Upper Body Dressing : Set up;Sitting   Lower Body Dressing: Supervision/safety;Set up;Bed level;Sitting/lateral leans                 General ADL Comments: Limited eval as pt would not stand for therapist. He sat EOB and wiped off face with washcloth.      Vision     Perception     Praxis      Pertinent Vitals/Pain Pain Assessment: Faces Pain Score: 0-No pain     Hand Dominance Right   Extremity/Trunk Assessment Upper Extremity Assessment Upper Extremity Assessment: Overall WFL for tasks assessed   Lower Extremity Assessment Lower Extremity Assessment: Defer to PT evaluation       Communication Communication Communication: HOH   Cognition Arousal/Alertness: Awake/alert Behavior During Therapy: Agitated Overall Cognitive Status: No family/caregiver  present to determine baseline cognitive functioning (difficult to assess due to [facility])                     General Comments       Exercises       Shoulder Instructions      Home Living Family/patient expects to be discharged to:: Private residence Living Arrangements: Alone   Type of Home: House Home Access: Stairs to enter Technical brewer of Steps: 7 Entrance Stairs-Rails: Left Home Layout: One level               Home Equipment: None   Additional Comments: takes cab or bus to store monthly for grocery shopping      Prior Functioning/Environment Level of Independence: Independent        Comments: cooks simple meals in microwave and stovetop    OT Diagnosis: Generalized weakness   OT Problem List: Decreased activity tolerance;Decreased knowledge of precautions;Decreased knowledge of use of DME or AE;Decreased strength   OT Treatment/Interventions: Self-care/ADL training;DME and/or AE instruction;Therapeutic exercise;Therapeutic activities;Cognitive remediation/compensation;Balance training;Patient/family education    OT Goals(Current goals can be found in the care plan section) Acute Rehab OT Goals Patient Stated Goal: not  stated OT Goal Formulation:  (pt agitated-goals not discussed) Time For Goal Achievement: 03/09/15 Potential to Achieve Goals: Good ADL Goals Pt Will Perform Lower Body Bathing: with set-up;sit to/from stand Pt Will Perform Lower Body Dressing: with set-up;sit to/from stand Pt Will Transfer to Toilet: with supervision;ambulating Pt Will Perform Toileting - Clothing Manipulation and hygiene: sit to/from stand;with set-up  OT Frequency: Min 2X/week   Barriers to D/C:            Co-evaluation              End of Session Nurse Communication: Mobility status;Other (comment) (pt agitated; bed alarm set)  Activity Tolerance: Patient limited by fatigue Patient left: in bed;with call bell/phone within reach;with bed  alarm set   Time: 6546-5035 OT Time Calculation (min): 9 min Charges:  OT General Charges $OT Visit: 1 Procedure OT Evaluation $Initial OT Evaluation Tier I: 1 Procedure G-CodesBenito Mccreedy OTR/L [id number] 03/02/2015, 2:38 PM

## 2015-03-03 DIAGNOSIS — B888 Other specified infestations: Secondary | ICD-10-CM

## 2015-03-03 DIAGNOSIS — J441 Chronic obstructive pulmonary disease with (acute) exacerbation: Principal | ICD-10-CM

## 2015-03-03 DIAGNOSIS — E43 Unspecified severe protein-calorie malnutrition: Secondary | ICD-10-CM

## 2015-03-03 DIAGNOSIS — E0590 Thyrotoxicosis, unspecified without thyrotoxic crisis or storm: Secondary | ICD-10-CM

## 2015-03-03 DIAGNOSIS — I5022 Chronic systolic (congestive) heart failure: Secondary | ICD-10-CM

## 2015-03-03 LAB — CBC
HCT: 36.7 % — ABNORMAL LOW (ref 39.0–52.0)
HEMOGLOBIN: 11.9 g/dL — AB (ref 13.0–17.0)
MCH: 31.4 pg (ref 26.0–34.0)
MCHC: 32.4 g/dL (ref 30.0–36.0)
MCV: 96.8 fL (ref 78.0–100.0)
PLATELETS: 156 10*3/uL (ref 150–400)
RBC: 3.79 MIL/uL — ABNORMAL LOW (ref 4.22–5.81)
RDW: 12.2 % (ref 11.5–15.5)
WBC: 11.8 10*3/uL — ABNORMAL HIGH (ref 4.0–10.5)

## 2015-03-03 LAB — T4, FREE: Free T4: 0.96 ng/dL (ref 0.61–1.12)

## 2015-03-03 MED ORDER — BUDESONIDE 0.5 MG/2ML IN SUSP
0.5000 mg | Freq: Four times a day (QID) | RESPIRATORY_TRACT | Status: DC
  Filled 2015-03-03 (×3): qty 2

## 2015-03-03 MED ORDER — IPRATROPIUM-ALBUTEROL 0.5-2.5 (3) MG/3ML IN SOLN
3.0000 mL | Freq: Four times a day (QID) | RESPIRATORY_TRACT | Status: DC
  Administered 2015-03-04 (×2): 3 mL via RESPIRATORY_TRACT
  Filled 2015-03-03 (×3): qty 3

## 2015-03-03 MED ORDER — BUDESONIDE 0.5 MG/2ML IN SUSP
0.5000 mg | Freq: Four times a day (QID) | RESPIRATORY_TRACT | Status: DC
  Administered 2015-03-04 (×2): 0.5 mg via RESPIRATORY_TRACT
  Filled 2015-03-03 (×7): qty 2

## 2015-03-03 MED ORDER — BUDESONIDE 0.5 MG/2ML IN SUSP
0.5000 mg | Freq: Four times a day (QID) | RESPIRATORY_TRACT | Status: DC
  Filled 2015-03-03 (×2): qty 2

## 2015-03-03 NOTE — Progress Notes (Signed)
[facility]                                                                              Patient Demographics  Anthony Bolton, is a 64 y.o. male, DOB - September 05, 1951, WUJ:[id number]  Admit date - 03/01/2015   Admitting Physician No admitting provider for patient encounter.  Outpatient Primary MD for the patient is [name], MD  LOS - 2   Chief Complaint  Patient presents with  . Shortness of Breath  . Chest Pain      HPI on 03/01/2015 by Ms. Imogene Burn Anthony Bolton is a 64 y.o. male, with a PMH of severe COPD, CHF (EF 20%), malnutrition and hearing loss presented to the ER 6/3 with SOB and cough causing CP. He is very hard of hearing which makes history gathering a little challenging. He reports that he has been in bed for two days unable to get up due to weakness and SOB. He has been coughing up sputum that has been yellow but today is white. When he coughs he has pain in his chest that he rates an 8. Mr. Deutschman was last admitted to [facility] in February 2016, but has had multiple visits to the ER since discharge including 3 visits this week. He states he is out of all of his medications at home. Per Epic he has not seen pulmonology despite the ER arranging an appointment for him in March 2016.  In the ER 6/3 he received mag sulfate, fluids and nebulizers but still desatted to 87% on room air and became dyspneic with walking short distances. His labs show dehydration. BNP is not elevated. POC Troponin is WNL. The ER RN found several small insects on the patient (beg bugs?).  Assessment & Plan   COPD Exacerbation -Improved -Not on supplemental oxygen -Was on solumedrol, weaned to PO prednisone -Continue flutter valve, mucinex, Pulmicort, nebulizer treatments, doxycycline -No longer smokes. -Pulmonology consulted and appreciated  Systolic CHF/Ischemic heart disease -Appears to be euvolemic and compensated  -BNP 114 -Echocardiogram 11/05/2013 showed an EF of 20-25%,  diffuse hypokinesis -Continue to monitor daily weights, intake and output -Patient's blood pressures on the softer side -Home medication show Corrigan digoxin however patient not taking his medications -No ACE inhibitor noted on medication list, ? Unclear as to why however due to blood pressure being soft, will not start at this time -patient should follow up with Dr. Terrence Dupont as an outpatient -Continue aspirin and statin  Dehydration -Appears to be improved  Hx of hyperthyroidism listed. -TSH: 0.06 -Pending free T4 and T3 levels  Hyperlipidemia -Lipid panel:  Total cholesterol 194, triglycerides 58, HDL 45, LDL 137  -Continue statin   Social -Per patient, he has no family. Is at home alone. -Patient will likely need nursing home placement.  Bed Bugs -Initially started on  Permethin, but was discontinued  Severe protein calorie malnutrition -Continue feeding supplements  Code Status: Full  Family Communication: None at bedside  Disposition Plan: Admitted.  Pending SNF.   Time Spent in minutes   30 minutes  Procedures  None  Consults   Pulmonology   DVT Prophylaxis  Lovenox  Lab Results  Component Value Date  PLT 156 03/03/2015    Medications  Scheduled Meds: . aspirin EC  81 mg Oral Daily  . atorvastatin  40 mg Oral QHS  . budesonide (PULMICORT) nebulizer solution  0.5 mg Nebulization BID  . doxycycline  100 mg Oral Q12H  . enoxaparin (LOVENOX) injection  40 mg Subcutaneous Q24H  . feeding supplement (ENSURE ENLIVE)  237 mL Oral BID BM  . guaiFENesin  1,200 mg Oral BID  . ipratropium-albuterol  3 mL Nebulization BID  . Melatonin  3 mg Oral QHS  . multivitamin with minerals  1 tablet Oral Daily  . predniSONE  40 mg Oral BID WC  . sodium chloride  3 mL Intravenous Q12H   Continuous Infusions:  PRN Meds:.acetaminophen **OR** acetaminophen, albuterol, alum & mag hydroxide-simeth, aspirin, ondansetron **OR** ondansetron (ZOFRAN) IV, polyethylene glycol,  traZODone  Antibiotics    Anti-infectives    Start     Dose/Rate Route Frequency Ordered Stop   03/01/15 1300  doxycycline (VIBRA-TABS) tablet 100 mg     100 mg Oral Every 12 hours 03/01/15 1209          Subjective:   [name] seen and examined today.  Patient very hard of hearing. Denies any chest pain. States his breathing has improved mildly. Denies any abdominal pain, nausea, vomiting, constipation or diarrhea, dizziness or headache. Patient states he has no family and lives alone.   Objective:   Filed Vitals:   03/02/15 2054 03/03/15 0149 03/03/15 0605 03/03/15 1012  BP: 110/73 110/75 108/87 92/59  Pulse: 62 60 52 56  Temp: 98.8 F (37.1 C) 98.7 F (37.1 C) 97.8 F (36.6 C) 98.3 F (36.8 C)  TempSrc: Oral Oral Oral Oral  Resp: 18 16 20 20   Height:      Weight:      SpO2: 100% 98% 98% 94%    Wt Readings from Last 3 Encounters:  03/02/15 50 kg (110 lb 3.7 oz)  01/14/15 50.349 kg (111 lb)  12/29/14 50.712 kg (111 lb 12.8 oz)     Intake/Output Summary (Last 24 hours) at 03/03/15 1204 Last data filed at 03/03/15 0512  Gross per 24 hour  Intake      0 ml  Output    200 ml  Net   -200 ml    Exam  General: Well developed, thin, no distress  HEENT: NCAT,  mucous membranes moist.   Cardiovascular: S1 S2 auscultated, no rubs, murmurs or gallops. Regular rate and rhythm.  Respiratory: Diminished, however clear  Abdomen: Soft, nontender, nondistended, + bowel sounds  Extremities: warm dry without cyanosis clubbing or edema  Neuro: AAOx3, hard of hearing, otherwise nonfocal  Data Review   Micro Results No results found for this or any previous visit (from the past 240 hour(s)).  Radiology Reports Dg Chest Port 1 View  03/02/2015   CLINICAL DATA:  Pneumonia.  EXAM: PORTABLE CHEST - 1 VIEW  COMPARISON:  03/01/2015  FINDINGS: Cardiac silhouette normal in size and configuration. No mediastinal or hilar masses or evidence of adenopathy.  Lungs are  hyperexpanded. There is mild interstitial thickening most evident in the lung bases. Relative lucency in the upper lobes is consistent with emphysema. These findings are stable. There is no lung consolidation or edema. No pleural effusion or pneumothorax.  Bony thorax is grossly intact.  IMPRESSION: 1. No acute cardiopulmonary disease. 2. COPD.   Electronically Signed   By: Lajean Manes M.D.   On: 03/02/2015 09:36   Dg Chest Port 1  View  03/01/2015   CLINICAL DATA:  Shortness of breath and chest pain  EXAM: PORTABLE CHEST - 1 VIEW  COMPARISON:  Feb 24, 2015 and Feb 20, 2015  FINDINGS: Lungs are somewhat hyperexpanded but clear. The heart size and pulmonary vascularity are within normal limits. No adenopathy. No bone lesions.  IMPRESSION: No edema or consolidation.   Electronically Signed   By: Lowella Grip III M.D.   On: 03/01/2015 07:31   Dg Chest Port 1 View  02/24/2015   CLINICAL DATA:  Acute onset of shortness of breath and generalized chest pain. Initial encounter.  EXAM: PORTABLE CHEST - 1 VIEW  COMPARISON:  Chest radiograph performed 02/20/2015  FINDINGS: The lungs are well-aerated. Minimal right lower lung zone airspace opacity may reflect mild pneumonia. There is no evidence of pleural effusion or pneumothorax.  The cardiomediastinal silhouette is within normal limits. No acute osseous abnormalities are seen. There is chronic fragmentation involving the distal left clavicle.  IMPRESSION: Minimal right lower lung zone airspace opacity may reflect mild pneumonia.   Electronically Signed   By: Garald Balding M.D.   On: 02/24/2015 00:23   Dg Chest Port 1 View  02/20/2015   CLINICAL DATA:  Shortness of breath.  COPD.  EXAM: PORTABLE CHEST - 1 VIEW  COMPARISON:  01/14/2015  FINDINGS: Cardiomediastinal silhouette is within normal limits. The lungs are hyperinflated consistent with COPD. No airspace consolidation, edema, pleural effusion, or pneumothorax is identified. No acute osseous abnormality is  seen.  IMPRESSION: COPD without evidence of acute airspace disease.   Electronically Signed   By: Logan Bores   On: 02/20/2015 20:10    CBC  Recent Labs Lab 03/01/15 0736 03/02/15 0558 03/03/15 0918  WBC 10.5 11.3* 11.8*  HGB 15.2 12.5* 11.9*  HCT 44.8 36.9* 36.7*  PLT 185 141* 156  MCV 98.0 96.1 96.8  MCH 33.3 32.6 31.4  MCHC 33.9 33.9 32.4  RDW 12.2 12.1 12.2    Chemistries   Recent Labs Lab 03/01/15 0736 03/02/15 0558  NA 136 134*  K 4.4 4.4  CL 96* 100*  CO2 23 22  GLUCOSE 85 180*  BUN 30* 20  CREATININE 1.07 0.77  CALCIUM 9.2 8.3*  AST  --  26  ALT  --  19  ALKPHOS  --  41  BILITOT  --  0.4   ------------------------------------------------------------------------------------------------------------------ estimated creatinine clearance is 66 mL/min (by C-G formula based on Cr of 0.77). ------------------------------------------------------------------------------------------------------------------ No results for input(s): HGBA1C in the last 72 hours. ------------------------------------------------------------------------------------------------------------------  Recent Labs  03/02/15 0558  CHOL 194  HDL 45  LDLCALC 137*  TRIG 58  CHOLHDL 4.3   ------------------------------------------------------------------------------------------------------------------  Recent Labs  03/01/15 1334  TSH 0.060*   ------------------------------------------------------------------------------------------------------------------ No results for input(s): VITAMINB12, FOLATE, FERRITIN, TIBC, IRON, RETICCTPCT in the last 72 hours.  Coagulation profile No results for input(s): INR, PROTIME in the last 168 hours.  No results for input(s): DDIMER in the last 72 hours.  Cardiac Enzymes No results for input(s): CKMB, TROPONINI, MYOGLOBIN in the last 168 hours.  Invalid input(s):  CK ------------------------------------------------------------------------------------------------------------------ Invalid input(s): POCBNP    Camia Dipinto D.O. on 03/03/2015 at 12:04 PM  Between 7am to 7pm - Pager - [phone]  After 7pm go to [email or web address] - password [email or web address]  And look for the night coverage person covering for me after hours  [facility] Office  [phone]

## 2015-03-04 LAB — BASIC METABOLIC PANEL WITH GFR
Anion gap: 8 (ref 5–15)
BUN: 21 mg/dL — ABNORMAL HIGH (ref 6–20)
CO2: 27 mmol/L (ref 22–32)
Calcium: 8.3 mg/dL — ABNORMAL LOW (ref 8.9–10.3)
Chloride: 101 mmol/L (ref 101–111)
Creatinine, Ser: 0.74 mg/dL (ref 0.61–1.24)
GFR calc Af Amer: >60 mL/min
GFR calc non Af Amer: >60 mL/min
Glucose, Bld: 129 mg/dL — ABNORMAL HIGH (ref 65–99)
Potassium: 4.1 mmol/L (ref 3.5–5.1)
Sodium: 136 mmol/L (ref 135–145)

## 2015-03-04 LAB — T3, FREE: T3 FREE: 2.1 pg/mL (ref 2.0–4.4)

## 2015-03-04 LAB — CBC
HCT: 34.1 % — ABNORMAL LOW (ref 39.0–52.0)
Hemoglobin: 11.3 g/dL — ABNORMAL LOW (ref 13.0–17.0)
MCH: 32 pg (ref 26.0–34.0)
MCHC: 33.1 g/dL (ref 30.0–36.0)
MCV: 96.6 fL (ref 78.0–100.0)
Platelets: 158 10*3/uL (ref 150–400)
RBC: 3.53 MIL/uL — ABNORMAL LOW (ref 4.22–5.81)
RDW: 12.2 % (ref 11.5–15.5)
WBC: 10.1 10*3/uL (ref 4.0–10.5)

## 2015-03-04 MED ORDER — ATORVASTATIN CALCIUM 40 MG PO TABS: 40.0000 mg | ORAL_TABLET | Freq: Every day | ORAL | Status: DC

## 2015-03-04 MED ORDER — GUAIFENESIN ER 600 MG PO TB12: 1200.0000 mg | ORAL_TABLET | Freq: Two times a day (BID) | ORAL | Status: DC

## 2015-03-04 MED ORDER — BUDESONIDE 0.5 MG/2ML IN SUSP: 0.5000 mg | Freq: Four times a day (QID) | RESPIRATORY_TRACT | Status: DC

## 2015-03-04 MED ORDER — DOXYCYCLINE HYCLATE 100 MG PO TABS: 100.0000 mg | ORAL_TABLET | Freq: Two times a day (BID) | ORAL | Status: DC

## 2015-03-04 MED ORDER — PREDNISONE 10 MG PO TABS: 10.0000 mg | ORAL_TABLET | Freq: Every day | ORAL | Status: DC

## 2015-03-04 MED ORDER — IPRATROPIUM-ALBUTEROL 0.5-2.5 (3) MG/3ML IN SOLN: 3.0000 mL | Freq: Four times a day (QID) | RESPIRATORY_TRACT | Status: DC

## 2015-03-04 MED ORDER — ASPIRIN 81 MG PO TBEC: 81.0000 mg | DELAYED_RELEASE_TABLET | Freq: Every day | ORAL | Status: DC

## 2015-03-04 MED ORDER — ENSURE ENLIVE PO LIQD: 237.0000 mL | Freq: Two times a day (BID) | ORAL | Status: DC

## 2015-03-04 MED ORDER — ADULT MULTIVITAMIN W/MINERALS CH: 1.0000 | ORAL_TABLET | Freq: Every day | ORAL | Status: DC

## 2015-03-04 MED ORDER — ALBUTEROL SULFATE (2.5 MG/3ML) 0.083% IN NEBU: 2.5000 mg | INHALATION_SOLUTION | RESPIRATORY_TRACT | Status: DC | PRN

## 2015-03-04 NOTE — Care Management Note (Signed)
Case Management Note  Patient Details  Name: Anthony Bolton MRN: [id number] Date of Birth: 17-Nov-1950  Subjective/Objective:                    Action/Plan: Met with patient to discuss home health needs.  Patient states that he is interested in a home health RN.  CM discussed the need to have a professional exterminate his home due to bed bugs.  Patient states he does not have any intention of having that done.  CM explained to patient that this would be a barrier to provided home health services.  Patient states that he will have his friend Vira Blanco available to assist with errands/around the house when she is not working.  CM contacted [facility] to notify of need for nebulizer prior to discharge home today.  Dr Ree Kida was notified that patient will not be receiving home health RN services.  Expected Discharge Date:                  Expected Discharge Plan:  [place]  In-House Referral:  Clinical Social Work  Discharge planning Services  CM Consult  Post Acute Care Choice:    Choice offered to:     DME Arranged:  Nebulizer machine DME Agency:  [place]:    [facility] Agency:     Status of Service:  In process, will continue to follow  Medicare Important Message Given:    Date Medicare IM Given:    Medicare IM give by:    Date Additional Medicare IM Given:    Additional Medicare Important Message give by:     If discussed at [place] of Stay Meetings, dates discussed:    Additional Comments:  Rolm Baptise, RN 03/04/2015, 2:38 PM

## 2015-03-04 NOTE — Discharge Instructions (Signed)

## 2015-03-04 NOTE — Progress Notes (Addendum)
Patient ready for discharge home; transported out via wheelchair for taxi ride to his home; all belonging returned; discharge instructions were reviewed and rx's given to patient.

## 2015-03-04 NOTE — Clinical Social Work Note (Signed)
CSW Consult Acknowledged:   CSW received a consult for SNF placement. Pt prefers to go home. MD and the case manager is aware. CSW will sign off.   [place], MSW, [place]

## 2015-03-04 NOTE — Discharge Summary (Signed)
Physician Discharge Summary  Anthony Bolton JQZ:[id number] DOB: 09/10/1951 DOA: 03/01/2015  PCP: Anthony Peers, MD  Admit date: 03/01/2015 Discharge date: 03/04/2015  Time spent: 45 minutes  Recommendations for Outpatient Follow-up:  Patient will be discharged to home with home health RN.  Patient will need to follow up with primary care provider within one week of discharge.  Patient to also follow-up with Dr. Terrence Dupont, cardiologist, within 2-4 weeks of discharge. Follow up with Dr. Melvyn Novas at the specified time.  Patient should continue medications as prescribed.  Patient should follow a heart healthy diet.   Discharge Diagnoses:  COPD exacerbation Systolic CHF/ischemic heart disease Dehydration History of hyperthyroidism Hyperlipidemia Social issues Bedbugs Severe calorie malnutrition  Discharge Condition: Stable   Diet recommendation: Heart healthy  Filed Weights   03/02/15 0106 03/04/15 0500  Weight: 50 kg (110 lb 3.7 oz) 53.6 kg (118 lb 2.7 oz)    History of present illness:  on 03/01/2015 by Ms. Anthony Bolton Anthony Bolton is a 64 y.o. male, with a PMH of severe COPD, CHF (EF 20%), malnutrition and hearing loss presented to the ER 6/3 with SOB and cough causing CP. He is very hard of hearing which makes history gathering a little challenging. He reports that he has been in bed for two days unable to get up due to weakness and SOB. He has been coughing up sputum that has been yellow but today is white. When he coughs he has pain in his chest that he rates an 8. Anthony Bolton was last admitted to [facility] in February 2016, but has had multiple visits to the ER since discharge including 3 visits this week. He states he is out of all of his medications at home. Per Epic he has not seen pulmonology despite the ER arranging an appointment for him in March 2016.  In the ER 6/3 he received mag sulfate, fluids and nebulizers but still desatted to 87% on room air and became dyspneic with  walking short distances. His labs show dehydration. BNP is not elevated. POC Troponin is WNL. The ER RN found several small insects on the patient (beg bugs?).  Hospital Course:  COPD Exacerbation -Improved -Not on supplemental oxygen -Was on solumedrol, weaned to PO prednisone -Continue flutter valve, mucinex, Pulmicort, nebulizer treatments, doxycycline -No longer smokes. -Pulmonology consulted and appreciated -Will discharge patient with nebulizer and treatments, steroid taper, doxycycline and pulmicort  Systolic CHF/Ischemic heart disease -Appears to be euvolemic and compensated  -BNP 114 -Echocardiogram 11/05/2013 showed an EF of 20-25%, diffuse hypokinesis -Continue to monitor daily weights, intake and output -Patient's blood pressures on the softer side -Home medication show Corrigan digoxin however patient not taking his medications -No ACE inhibitor noted on medication list, ? Unclear as to why however due to blood pressure being soft, will not start at this time -patient should follow up with Dr. Terrence Dupont as an outpatient -Continue aspirin and statin  Dehydration -Appears to be improved  Hx of hyperthyroidism listed. -TSH: 0.06 -T4 0.96, T3 2.1 (both WNL)  Hyperlipidemia -Lipid panel: Total cholesterol 194, triglycerides 58, HDL 45, LDL 137  -Continue statin   Social -Per patient, he has no family. Is at home alone. -Patient will likely need nursing home placement. -Patient refused SNF, will discharge with [facility] services.  Bed Bugs -Initially started on Permethin, but was discontinued  Severe protein calorie malnutrition -Continue feeding supplements  Procedures  None  Consults  Pulmonology  Discharge Exam: Filed Vitals:   03/04/15 0616  BP:  106/70  Pulse: 65  Temp: 97.6 F (36.4 C)  Resp: 18   Exam  General: Well developed, thin, no apparent distress  HEENT: NCAT, mucous membranes moist.   Cardiovascular: S1 S2 auscultated,  RRR  Respiratory: Diminished, however clear  Abdomen: Soft, nontender, nondistended, + bowel sounds  Extremities: warm dry without cyanosis clubbing or edema  Neuro: AAOx3, hard of hearing, otherwise nonfocal  Discharge Instructions      Discharge Instructions    Discharge instructions    Complete by:  As directed   Patient will be discharged to home with home health RN.  Patient will need to follow up with primary care provider within one week of discharge.  Patient to also follow-up with Dr. Terrence Dupont, cardiologist, within 2-4 weeks of discharge. Patient should continue medications as prescribed.  Patient should follow a heart healthy diet.            Medication List    STOP taking these medications        carvedilol 3.125 MG tablet  Commonly known as:  COREG     digoxin 0.125 MG tablet  Commonly known as:  LANOXIN      TAKE these medications        albuterol (2.5 MG/3ML) 0.083% nebulizer solution  Commonly known as:  PROVENTIL  Take 3 mLs (2.5 mg total) by nebulization every 2 (two) hours as needed for wheezing or shortness of breath.     aspirin 325 MG tablet  Take 650 mg by mouth every 6 (six) hours as needed for mild pain or headache.     aspirin 81 MG EC tablet  Take 1 tablet (81 mg total) by mouth daily.     atorvastatin 40 MG tablet  Commonly known as:  LIPITOR  Take 1 tablet (40 mg total) by mouth at bedtime.     budesonide 0.5 MG/2ML nebulizer solution  Commonly known as:  PULMICORT  Take 2 mLs (0.5 mg total) by nebulization every 6 (six) hours.     doxycycline 100 MG tablet  Commonly known as:  VIBRA-TABS  Take 1 tablet (100 mg total) by mouth every 12 (twelve) hours.     feeding supplement (ENSURE ENLIVE) Liqd  Take 237 mLs by mouth 2 (two) times daily between meals.     guaiFENesin 600 MG 12 hr tablet  Commonly known as:  MUCINEX  Take 2 tablets (1,200 mg total) by mouth 2 (two) times daily.     ipratropium-albuterol 0.5-2.5 (3) MG/3ML Soln   Commonly known as:  DUONEB  Take 3 mLs by nebulization every 6 (six) hours.     Melatonin 1 MG Caps  Take 2 capsules by mouth at bedtime.     multivitamin with minerals Tabs tablet  Take 1 tablet by mouth daily.     predniSONE 10 MG tablet  Commonly known as:  DELTASONE  Take 1 tablet (10 mg total) by mouth daily with breakfast. Prednisone dosing: Take  Prednisone 40mg  (4 tabs) x 3 days, then taper to 30mg  (3 tabs) x 3 days, then 20mg  (2 tabs) x 3days, then 10mg  (1 tab) x 3days, then OFF.     traZODone 50 MG tablet  Commonly known as:  DESYREL  Take 50 mg by mouth at bedtime.       No Known Allergies Follow-up Information    Follow up with Anthony Peers, MD On 03/14/2015.   Specialty:  Family Medicine   Why:  @1130    Contact information:   [phone]  [place] [address] [phone]       Follow up with Christinia Gully, MD On 03/15/2015.   Specialty:  Pulmonary Disease   Why:  1130 am    Contact information:   [address] [place] [address] [phone]       Follow up with Anthony Peers, MD. Schedule an appointment as soon as possible for a visit in 1 week.   Specialty:  Family Medicine   Why:  Hospital follow up   Contact information:   [address] [place] [address] [phone]       Follow up with Charolette Forward, MD. Schedule an appointment as soon as possible for a visit in 2 weeks.   Specialty:  Cardiology   Why:  CHF follow up   Contact information:   [address] [address] [phone]        The results of significant diagnostics from this hospitalization (including imaging, microbiology, ancillary and laboratory) are listed below for reference.    Significant Diagnostic Studies: Dg Chest Port 1 View  03/02/2015   CLINICAL DATA:  Pneumonia.  EXAM: PORTABLE CHEST - 1 VIEW  COMPARISON:  03/01/2015  FINDINGS: Cardiac silhouette normal in size and configuration. No mediastinal or hilar masses  or evidence of adenopathy.  Lungs are hyperexpanded. There is mild interstitial thickening most evident in the lung bases. Relative lucency in the upper lobes is consistent with emphysema. These findings are stable. There is no lung consolidation or edema. No pleural effusion or pneumothorax.  Bony thorax is grossly intact.  IMPRESSION: 1. No acute cardiopulmonary disease. 2. COPD.   Electronically Signed   By: Lajean Manes M.D.   On: 03/02/2015 09:36   Dg Chest Port 1 View  03/01/2015   CLINICAL DATA:  Shortness of breath and chest pain  EXAM: PORTABLE CHEST - 1 VIEW  COMPARISON:  Feb 24, 2015 and Feb 20, 2015  FINDINGS: Lungs are somewhat hyperexpanded but clear. The heart size and pulmonary vascularity are within normal limits. No adenopathy. No bone lesions.  IMPRESSION: No edema or consolidation.   Electronically Signed   By: Lowella Grip III M.D.   On: 03/01/2015 07:31   Dg Chest Port 1 View  02/24/2015   CLINICAL DATA:  Acute onset of shortness of breath and generalized chest pain. Initial encounter.  EXAM: PORTABLE CHEST - 1 VIEW  COMPARISON:  Chest radiograph performed 02/20/2015  FINDINGS: The lungs are well-aerated. Minimal right lower lung zone airspace opacity may reflect mild pneumonia. There is no evidence of pleural effusion or pneumothorax.  The cardiomediastinal silhouette is within normal limits. No acute osseous abnormalities are seen. There is chronic fragmentation involving the distal left clavicle.  IMPRESSION: Minimal right lower lung zone airspace opacity may reflect mild pneumonia.   Electronically Signed   By: Garald Balding M.D.   On: 02/24/2015 00:23   Dg Chest Port 1 View  02/20/2015   CLINICAL DATA:  Shortness of breath.  COPD.  EXAM: PORTABLE CHEST - 1 VIEW  COMPARISON:  01/14/2015  FINDINGS: Cardiomediastinal silhouette is within normal limits. The lungs are hyperinflated consistent with COPD. No airspace consolidation, edema, pleural effusion, or pneumothorax is  identified. No acute osseous abnormality is seen.  IMPRESSION: COPD without evidence of acute airspace disease.   Electronically Signed   By: Logan Bores   On: 02/20/2015 20:10    Microbiology: No results found for this  or any previous visit (from the past 240 hour(s)).   Labs: Basic Metabolic Panel:  Recent Labs Lab 03/01/15 0736 03/02/15 0558 03/04/15 0533  NA 136 134* 136  K 4.4 4.4 4.1  CL 96* 100* 101  CO2 23 22 27   GLUCOSE 85 180* 129*  BUN 30* 20 21*  CREATININE 1.07 0.77 0.74  CALCIUM 9.2 8.3* 8.3*   Liver Function Tests:  Recent Labs Lab 03/02/15 0558  AST 26  ALT 19  ALKPHOS 41  BILITOT 0.4  PROT 5.4*  ALBUMIN 3.2*   No results for input(s): LIPASE, AMYLASE in the last 168 hours. No results for input(s): AMMONIA in the last 168 hours. CBC:  Recent Labs Lab 03/01/15 0736 03/02/15 0558 03/03/15 0918 03/04/15 0533  WBC 10.5 11.3* 11.8* 10.1  HGB 15.2 12.5* 11.9* 11.3*  HCT 44.8 36.9* 36.7* 34.1*  MCV 98.0 96.1 96.8 96.6  PLT 185 141* 156 158   Cardiac Enzymes: No results for input(s): CKTOTAL, CKMB, CKMBINDEX, TROPONINI in the last 168 hours. BNP: BNP (last 3 results)  Recent Labs  01/09/15 1540 02/20/15 1927 03/01/15 0736  BNP 86.2 86.2 114.9*    ProBNP (last 3 results) No results for input(s): PROBNP in the last 8760 hours.  CBG: No results for input(s): GLUCAP in the last 168 hours.     SignedCristal Ford  [facility] 03/04/2015, 12:02 PM

## 2015-03-05 ENCOUNTER — Emergency Department (HOSPITAL_COMMUNITY)
Admission: EM | Admit: 2015-03-05 | Discharge: 2015-03-05 | Disposition: A | Discharge status: Home / Self Care | Payer: Medicaid Other | Attending: Emergency Medicine | Admitting: Emergency Medicine

## 2015-03-05 ENCOUNTER — Encounter (HOSPITAL_COMMUNITY): Payer: Self-pay | Admitting: Emergency Medicine

## 2015-03-05 ENCOUNTER — Emergency Department (HOSPITAL_COMMUNITY): Payer: Medicaid Other

## 2015-03-05 DIAGNOSIS — I429 Cardiomyopathy, unspecified: Secondary | ICD-10-CM | POA: Insufficient documentation

## 2015-03-05 DIAGNOSIS — Z7982 Long term (current) use of aspirin: Secondary | ICD-10-CM | POA: Insufficient documentation

## 2015-03-05 DIAGNOSIS — Z720 Tobacco use: Secondary | ICD-10-CM | POA: Insufficient documentation

## 2015-03-05 DIAGNOSIS — J441 Chronic obstructive pulmonary disease with (acute) exacerbation: Secondary | ICD-10-CM

## 2015-03-05 DIAGNOSIS — I509 Heart failure, unspecified: Secondary | ICD-10-CM | POA: Insufficient documentation

## 2015-03-05 DIAGNOSIS — Z79899 Other long term (current) drug therapy: Secondary | ICD-10-CM | POA: Diagnosis not present

## 2015-03-05 DIAGNOSIS — Z7951 Long term (current) use of inhaled steroids: Secondary | ICD-10-CM | POA: Diagnosis not present

## 2015-03-05 DIAGNOSIS — Z9889 Other specified postprocedural states: Secondary | ICD-10-CM | POA: Diagnosis not present

## 2015-03-05 DIAGNOSIS — R079 Chest pain, unspecified: Secondary | ICD-10-CM | POA: Diagnosis present

## 2015-03-05 DIAGNOSIS — H9190 Unspecified hearing loss, unspecified ear: Secondary | ICD-10-CM | POA: Diagnosis not present

## 2015-03-05 LAB — COMPREHENSIVE METABOLIC PANEL
ALT: 26 U/L (ref 17–63)
ANION GAP: 8 (ref 5–15)
AST: 25 U/L (ref 15–41)
Albumin: 3.3 g/dL — ABNORMAL LOW (ref 3.5–5.0)
Alkaline Phosphatase: 40 U/L (ref 38–126)
BUN: 21 mg/dL — ABNORMAL HIGH (ref 6–20)
CALCIUM: 8.7 mg/dL — AB (ref 8.9–10.3)
CHLORIDE: 99 mmol/L — AB (ref 101–111)
CO2: 28 mmol/L (ref 22–32)
CREATININE: 0.76 mg/dL (ref 0.61–1.24)
GFR calc Af Amer: >60 mL/min (ref >60)
Glucose, Bld: 96 mg/dL (ref 65–99)
POTASSIUM: 4.5 mmol/L (ref 3.5–5.1)
SODIUM: 135 mmol/L (ref 135–145)
TOTAL PROTEIN: 5.5 g/dL — AB (ref 6.5–8.1)
Total Bilirubin: 0.5 mg/dL (ref 0.3–1.2)

## 2015-03-05 LAB — CBC WITH DIFFERENTIAL/PLATELET
Basophils Absolute: 0 10*3/uL (ref 0.0–0.1)
Basophils Relative: 0 % (ref 0–1)
Eosinophils Absolute: 0.3 10*3/uL (ref 0.0–0.7)
Eosinophils Relative: 3 % (ref 0–5)
HCT: 43.2 % (ref 39.0–52.0)
HEMOGLOBIN: 14.3 g/dL (ref 13.0–17.0)
Lymphocytes Relative: 15 % (ref 12–46)
Lymphs Abs: 1.6 10*3/uL (ref 0.7–4.0)
MCH: 32.8 pg (ref 26.0–34.0)
MCHC: 33.1 g/dL (ref 30.0–36.0)
MCV: 99.1 fL (ref 78.0–100.0)
MONOS PCT: 9 % (ref 3–12)
Monocytes Absolute: 0.9 10*3/uL (ref 0.1–1.0)
NEUTROS ABS: 7.7 10*3/uL (ref 1.7–7.7)
Neutrophils Relative %: 74 % (ref 43–77)
Platelets: 148 10*3/uL — ABNORMAL LOW (ref 150–400)
RBC: 4.36 MIL/uL (ref 4.22–5.81)
RDW: 12.4 % (ref 11.5–15.5)
WBC: 10.5 10*3/uL (ref 4.0–10.5)

## 2015-03-05 LAB — TROPONIN I
TROPONIN I: 0.19 ng/mL — AB (ref <0.031)
Troponin I: 0.23 ng/mL — ABNORMAL HIGH (ref <0.031)

## 2015-03-05 LAB — BRAIN NATRIURETIC PEPTIDE: B Natriuretic Peptide: 305 pg/mL — ABNORMAL HIGH (ref 0.0–100.0)

## 2015-03-05 MED ORDER — ASPIRIN 325 MG PO TABS
325.0000 mg | ORAL_TABLET | Freq: Once | ORAL | Status: AC
  Administered 2015-03-05: 325 mg via ORAL
  Filled 2015-03-05: qty 1

## 2015-03-05 MED ORDER — NITROGLYCERIN 0.4 MG SL SUBL
0.4000 mg | SUBLINGUAL_TABLET | SUBLINGUAL | Status: DC | PRN
  Administered 2015-03-05: 0.4 mg via SUBLINGUAL
  Filled 2015-03-05: qty 1

## 2015-03-05 NOTE — Progress Notes (Signed)
CSW met with pt to discuss the alleged bed bug infestation at his home.  Pt admits to throwing his mattress out yesterday, but denies having a bed bug problem.  Pt encouraged to speak with his landlord (pt rents) about hiring an Conservation officer, nature.  Pt is reluctant to speak with his landlord about getting his house sprayed because he is afraid that he will get thrown out.  Pt refused NHP yesterday and continues to do so. CSW informed that his Edgar is refusing to visit pt because of bed bugs. Pt states that he has lights and water, uses either the cab or the bus to get to the grocery store, [facility] and the barbershop.  Pt states the only thing he doesn't have around him is the dentist.  Pt extremely HOH, so it was difficult to gage his competency.  He did give CSW permission to speak with his son, Camillia Herter ([phone]) but there was no answer and his VM was full.  Pt with limited support system, stating that all his family members are dead.  CSW will continue to follow and c/s Adult Protective Services if appropriate.

## 2015-03-05 NOTE — ED Notes (Signed)
Social work at bedside.  

## 2015-03-05 NOTE — Discharge Instructions (Signed)
Chronic Obstructive Pulmonary Disease °Chronic obstructive pulmonary disease (COPD) is a common lung condition in which airflow from the lungs is limited. COPD is a general term that can be used to describe many different lung problems that limit airflow, including both chronic bronchitis and emphysema.  If you have COPD, your lung function will probably never return to normal, but there are measures you can take to improve lung function and make yourself feel better.  °CAUSES  °· Smoking (common).   °· Exposure to secondhand smoke.   °· Genetic problems. °· Chronic inflammatory lung diseases or recurrent infections. °SYMPTOMS  °· Shortness of breath, especially with physical activity.   °· Deep, persistent (chronic) cough with a large amount of thick mucus.   °· Wheezing.   °· Rapid breaths (tachypnea).   °· Gray or bluish discoloration (cyanosis) of the skin, especially in fingers, toes, or lips.   °· Fatigue.   °· Weight loss.   °· Frequent infections or episodes when breathing symptoms become much worse (exacerbations).   °· Chest tightness. °DIAGNOSIS  °Your health care provider will take a medical history and perform a physical examination to make the initial diagnosis.  Additional tests for COPD may include:  °· Lung (pulmonary) function tests. °· Chest X-ray. °· CT scan. °· Blood tests. °TREATMENT  °Treatment available to help you feel better when you have COPD includes:  °· Inhaler and nebulizer medicines. These help manage the symptoms of COPD and make your breathing more comfortable. °· Supplemental oxygen. Supplemental oxygen is only helpful if you have a low oxygen level in your blood.   °· Exercise and physical activity. These are beneficial for nearly all people with COPD. Some people may also benefit from a pulmonary rehabilitation program. °HOME CARE INSTRUCTIONS  °· Take all medicines (inhaled or pills) as directed by your health care provider. °· Avoid over-the-counter medicines or cough syrups  that dry up your airway (such as antihistamines) and slow down the elimination of secretions unless instructed otherwise by your health care provider.   °· If you are a smoker, the most important thing that you can do is stop smoking. Continuing to smoke will cause further lung damage and breathing trouble. Ask your health care provider for help with quitting smoking. He or she can direct you to community resources or hospitals that provide support. °· Avoid exposure to irritants such as smoke, chemicals, and fumes that aggravate your breathing. °· Use oxygen therapy and pulmonary rehabilitation if directed by your health care provider. If you require home oxygen therapy, ask your health care provider whether you should purchase a pulse oximeter to measure your oxygen level at home.   °· Avoid contact with individuals who have a contagious illness. °· Avoid extreme temperature and humidity changes. °· Eat healthy foods. Eating smaller, more frequent meals and resting before meals may help you maintain your strength. °· Stay active, but balance activity with periods of rest. Exercise and physical activity will help you maintain your ability to do things you want to do. °· Preventing infection and hospitalization is very important when you have COPD. Make sure to receive all the vaccines your health care provider recommends, especially the pneumococcal and influenza vaccines. Ask your health care provider whether you need a pneumonia vaccine. °· Learn and use relaxation techniques to manage stress. °· Learn and use controlled breathing techniques as directed by your health care provider. Controlled breathing techniques include:   °· Pursed lip breathing. Start by breathing in (inhaling) through your nose for 1 second. Then, purse your lips as if you were   going to whistle and breathe out (exhale) through the pursed lips for 2 seconds.   Diaphragmatic breathing. Start by putting one hand on your abdomen just above  your waist. Inhale slowly through your nose. The hand on your abdomen should move out. Then purse your lips and exhale slowly. You should be able to feel the hand on your abdomen moving in as you exhale.   Learn and use controlled coughing to clear mucus from your lungs. Controlled coughing is a series of short, progressive coughs. The steps of controlled coughing are:   Lean your head slightly forward.   Breathe in deeply using diaphragmatic breathing.   Try to hold your breath for 3 seconds.   Keep your mouth slightly open while coughing twice.   Spit any mucus out into a tissue.   Rest and repeat the steps once or twice as needed. SEEK MEDICAL CARE IF:   You are coughing up more mucus than usual.   There is a change in the color or thickness of your mucus.   Your breathing is more labored than usual.   Your breathing is faster than usual.  SEEK IMMEDIATE MEDICAL CARE IF:   You have shortness of breath while you are resting.   You have shortness of breath that prevents you from:  Being able to talk.   Performing your usual physical activities.   You have chest pain lasting longer than 5 minutes.   Your skin color is more cyanotic than usual.  You measure low oxygen saturations for longer than 5 minutes with a pulse oximeter. MAKE SURE YOU:   Understand these instructions.  Will watch your condition.  Will get help right away if you are not doing well or get worse. Document Released: 06/24/2005 Document Revised: 01/29/2014 Document Reviewed: 05/11/2013 [facility] Patient Information 2015 [place], [place]. This information is not intended to replace advice given to you by your health care provider. Make sure you discuss any questions you have with your health care provider. Cardiomyopathy Cardiomyopathy means a disease of the heart muscle. The heart muscle becomes enlarged or stiff. The heart is not able to pump enough blood or deliver enough oxygen to the  body. This leads to heart failure and is the number one reason for heart transplants.  TYPES OF CARDIOMYOPATHY INCLUDE: DILATED  The most common type. The heart muscle is stretched out and weak so there is less blood pumped out.   Some causes:  Disease of the arteries of the heart (ischemia).  Heart attack with muscle scar.  Leaky or damaged valves.  After a viral illness.  Smoking.  High cholesterol.  Diabetes or overactive thyroid.  Alcohol or drug abuse.  High blood pressure.  May be reversible. HYPERTROPHIC The heart muscle grows bigger so there is less room for blood in the ventricle, and not enough blood is pumped out.   Causes include:  Mitral valve leaks.  Inherited tendency (from your family).  No explanation (idiopathic).  May be a cause of sudden death in young athletes with no symptoms. RESTRICTIVE The heart muscle becomes stiff, but not always larger. The heart has to work harder and will get weaker. Abnormal heart beats or rhythm (arrhythmia) are common.  Some causes:  Diseases in other parts of the body which may produce abnormal deposits in the heart muscle.  Probably not inherited.  A result of radiation treatment for cancer. SYMPTOMS OF ALL TYPES:  Less able to exercise or tolerate physical activity.  Palpitations.  Irregular heart beat, heart arrhythmias.  Shortness of breath, even at rest.  Chest pain.  Lightheadedness or fainting. TREATMENT  Life-style changes including reducing salt, lowering cholesterol, stop smoking.  Manage contributing causes with medications.  Medicines to help reduce the fluids in the body.  An implanted cardioverter defibrillator (ICD) to improve heart function and correct arrhythmias.  Medications to relax the blood vessels and make it easier for the heart to pump.  Drugs that help regulate heart beat and improve heart relaxation, reducing the work of the heart.  Myomectomy for patients with  hypertrophic cardiomyopathy and severe problems. This is a surgical procedure that removes a portion of the thickened muscle wall in order to improve heart output and provide symptom relief.  A heart transplant is an option in carefully applied circumstances. SEEK IMMEDIATE MEDICAL CARE IF:   You have severe chest pain, especially if the pain is crushing or pressure-like and spreads to the arms, back, neck, or jaw, or if you have sweating, feeling sick to your stomach (nausea), or shortness of breath. THIS IS AN EMERGENCY. Do not wait to see if the pain will go away. Get medical help at once. Call your local emergency services (911 in [place] DO NOT drive yourself to the hospital.  You develop severe shortness of breath.  You begin to cough up bloody sputum.  You are unable to sleep because you cannot breathe.  You gain weight due to fluid retention.  You develop painful swelling in your calf or leg.  You feel your heart racing and it does not go away or happens when you are resting. Document Released: 11/27/2004 Document Revised: 12/07/2011 Document Reviewed: 05/02/2008 [facility] Patient Information 2015 [place], [place]. This information is not intended to replace advice given to you by your health care provider. Make sure you discuss any questions you have with your health care provider.

## 2015-03-05 NOTE — ED Notes (Signed)
Patient provided a meal per Dr. Dorothyann Gibbs.

## 2015-03-05 NOTE — ED Notes (Signed)
Pt in via EMS C/O SOB and chest tightness. Pt has past hx of COPD. According to EMS diminished breath sounds.

## 2015-03-05 NOTE — ED Provider Notes (Signed)
CSN: [id number]     Arrival date & time 03/05/15  1635 History   First MD Initiated Contact with Patient 03/05/15 1638     Chief Complaint  Patient presents with  . Shortness of Breath  . Chest Pain     (Consider location/radiation/quality/duration/timing/severity/associated sxs/prior Treatment) HPI Patient with several prior presentations for similar complaint of shortness of breath and chest tightness. Patient reports he has COPD. His breathing became difficult earlier today. He also reports his chest feels tight and it is hard to take a deep breath. He denies had a fever. He does endorse intermittent cough. He has not had swelling in his legs. He reports he has not had enough to eat at his home and wants something to eat. Past Medical History  Diagnosis Date  . COPD (chronic obstructive pulmonary disease)   . HOH (hard of hearing)   . CHF (congestive heart failure) EF 20 - 25% in 2015   Past Surgical History  Procedure Laterality Date  . Appendectomy    . Left heart catheterization with coronary angiogram N/A 11/07/2013    Procedure: LEFT HEART CATHETERIZATION WITH CORONARY ANGIOGRAM;  Surgeon: Clent Demark, MD;  Location: [facility] CATH LAB;  Service: Cardiovascular;  Laterality: N/A;  . Cardiac stents      No stent history   No family history on file. History  Substance Use Topics  . Smoking status: Current Every Day Smoker    Types: Cigarettes  . Smokeless tobacco: Never Used  . Alcohol Use: No    Review of Systems 10 Systems reviewed and are negative for acute change except as noted in the HPI.    Allergies  Review of patient's allergies indicates no known allergies.  Home Medications   Prior to Admission medications   Medication Sig Start Date End Date Taking? Authorizing Provider  albuterol (PROVENTIL) (2.5 MG/3ML) 0.083% nebulizer solution Take 3 mLs (2.5 mg total) by nebulization every 2 (two) hours as needed for wheezing or shortness of breath. 03/04/15   Maryann  Mikhail, DO  aspirin 325 MG tablet Take 650 mg by mouth every 6 (six) hours as needed for mild pain or headache.     Historical Provider, MD  aspirin EC 81 MG EC tablet Take 1 tablet (81 mg total) by mouth daily. 03/04/15   Maryann Mikhail, DO  atorvastatin (LIPITOR) 40 MG tablet Take 1 tablet (40 mg total) by mouth at bedtime. 03/04/15   Maryann Mikhail, DO  budesonide (PULMICORT) 0.5 MG/2ML nebulizer solution Take 2 mLs (0.5 mg total) by nebulization every 6 (six) hours. 03/04/15   Maryann Mikhail, DO  doxycycline (VIBRA-TABS) 100 MG tablet Take 1 tablet (100 mg total) by mouth every 12 (twelve) hours. 03/04/15   Maryann Mikhail, DO  feeding supplement, ENSURE ENLIVE, (ENSURE ENLIVE) LIQD Take 237 mLs by mouth 2 (two) times daily between meals. 03/04/15   Maryann Mikhail, DO  guaiFENesin (MUCINEX) 600 MG 12 hr tablet Take 2 tablets (1,200 mg total) by mouth 2 (two) times daily. 03/04/15   Maryann Mikhail, DO  ipratropium-albuterol (DUONEB) 0.5-2.5 (3) MG/3ML SOLN Take 3 mLs by nebulization every 6 (six) hours. 03/04/15   Maryann Mikhail, DO  Melatonin 1 MG CAPS Take 2 capsules by mouth at bedtime.     Historical Provider, MD  Multiple Vitamin (MULTIVITAMIN WITH MINERALS) TABS tablet Take 1 tablet by mouth daily. 03/04/15   Maryann Mikhail, DO  predniSONE (DELTASONE) 10 MG tablet Take 1 tablet (10 mg total) by mouth daily with breakfast.  Prednisone dosing: Take  Prednisone 40mg  (4 tabs) x 3 days, then taper to 30mg  (3 tabs) x 3 days, then 20mg  (2 tabs) x 3days, then 10mg  (1 tab) x 3days, then OFF. 03/04/15   Maryann Mikhail, DO  traZODone (DESYREL) 50 MG tablet Take 50 mg by mouth at bedtime.    Historical Provider, MD   BP 116/82 mmHg  Pulse 79  Temp(Src) 98 F (36.7 C) (Oral)  Resp 16  SpO2 97% Physical Exam  Constitutional: He is oriented to person, place, and time.  Patient is thin borderline cachectic. He has moderate respiratory distress but is able to speak in short full sentences. He is actively getting  a DuoNeb as we valuate him.  HENT:  Head: Normocephalic and atraumatic.  Eyes: EOM are normal. Pupils are equal, round, and reactive to light.  Neck: Neck supple.  Cardiovascular: Normal rate, regular rhythm, normal heart sounds and intact distal pulses.   Pulmonary/Chest:  Moderate increased work of breathing. Patient is speaking in short sentences. Expiratory wheeze bilaterally course. Air flow through the lung fields.  Abdominal: Soft. Bowel sounds are normal. He exhibits no distension. There is no tenderness.  Musculoskeletal: Normal range of motion. He exhibits no edema or tenderness.  Neurological: He is alert and oriented to person, place, and time. He has normal strength. He exhibits normal muscle tone. Coordination normal. GCS eye subscore is 4. GCS verbal subscore is 5. GCS motor subscore is 6.  Skin: Skin is warm, dry and intact.  Psychiatric: He has a normal mood and affect.    ED Course  Procedures (including critical care time) Labs Review Labs Reviewed  COMPREHENSIVE METABOLIC PANEL - Abnormal; Notable for the following:    Chloride 99 (*)    BUN 21 (*)    Calcium 8.7 (*)    Total Protein 5.5 (*)    Albumin 3.3 (*)    All other components within normal limits  BRAIN NATRIURETIC PEPTIDE - Abnormal; Notable for the following:    B Natriuretic Peptide 305.0 (*)    All other components within normal limits  TROPONIN I - Abnormal; Notable for the following:    Troponin I 0.19 (*)    All other components within normal limits  CBC WITH DIFFERENTIAL/PLATELET - Abnormal; Notable for the following:    Platelets 148 (*)    All other components within normal limits  TROPONIN I - Abnormal; Notable for the following:    Troponin I 0.23 (*)    All other components within normal limits  CULTURE, BLOOD (ROUTINE X 2)  CULTURE, BLOOD (ROUTINE X 2)  BLOOD GAS, VENOUS    Imaging Review Dg Chest 2 View  03/05/2015   CLINICAL DATA:  Dyspnea and chest tightness.  EXAM: CHEST  2  VIEW  COMPARISON:  03/02/2015  FINDINGS: There is hyperinflation and findings are suggestive for emphysema. No focal airspace disease. Heart and mediastinum are within normal limits. Suspect old injury involving the lateral left clavicle.  IMPRESSION: No acute chest findings.  Hyperinflation is suggestive for emphysema.   Electronically Signed   By: Markus Daft M.D.   On: 03/05/2015 18:01     EKG Interpretation   Date/Time:  Tuesday March 05 2015 16:56:50 EDT Ventricular Rate:  88 PR Interval:  140 QRS Duration: 85 QT Interval:  360 QTC Calculation: 435 R Axis:   71 Text Interpretation:  Sinus rhythm Anteroseptal infarct, age indeterminate  no changes from previous Confirmed by Johnney Killian, MD, Jeannie Done [phone]) on  03/05/2015 7:00:59 PM     Patient's case was consult with Dr. Terrence Dupont. We reviewed the results of his cath report that doctor wanted performed last year. This point Dr. Terrence Dupont attributive the patient's troponin elevations to his underlying systolic cardiac dysfunction cardiomyopathy. He states the coronary arteries were clear and at this time there is not concern for cardiac ischemic dysfunction. He advises it is appropriate to have the patient discharged in follow-up in his office tomorrow for reevaluation.  MDM   Final diagnoses:  COPD exacerbation  Cardiomyopathy   At this time the patient presents with typical COPD type exacerbation which responded very well to DuoNeb. Symptoms were resolved and he was speaking comfortably with no signs of dyspnea. The patient had been given Solu-Medrol by EMS. The patient reported his chest pain is resolved and stated he was back to normal. His mental status is clear. He has eaten without difficulty. Per review with Dr. Terrence Dupont of the patient's troponin elevations, he does not need admission for this currently as the etiology is dilated cardiomyopathy without CAD. From perspective of his COPD exacerbation I do feel this has been well resolved in the  emergency department and he can continue outpatient management for COPD.     Charlesetta Shanks, MD 03/05/15 2213

## 2015-03-05 NOTE — Care Management (Signed)
ED CM received consult from [facility] concerning patient presenting to [facility] ED c/o SOB, and  CP via EMS after being discharged from [facility] yesterday. EMS states, that patient's home unclean and patient stated, that he has an infestation of bed bugs. [facility] declined [place] services due to bed bug infestation. ED evaluation still peding. ED CSW notified.Marland Kitchen

## 2015-03-05 NOTE — ED Notes (Signed)
IV attempt X2 

## 2015-03-05 NOTE — ED Notes (Signed)
MD at bedside. 

## 2015-03-07 ENCOUNTER — Encounter (HOSPITAL_COMMUNITY): Payer: Self-pay | Admitting: Emergency Medicine

## 2015-03-07 ENCOUNTER — Telehealth: Payer: Self-pay | Admitting: *Deleted

## 2015-03-07 ENCOUNTER — Emergency Department (HOSPITAL_COMMUNITY)
Admission: EM | Admit: 2015-03-07 | Discharge: 2015-03-07 | Disposition: A | Discharge status: Home / Self Care | Payer: Medicaid Other | Attending: Emergency Medicine | Admitting: Emergency Medicine

## 2015-03-07 DIAGNOSIS — Z7951 Long term (current) use of inhaled steroids: Secondary | ICD-10-CM | POA: Insufficient documentation

## 2015-03-07 DIAGNOSIS — Z9889 Other specified postprocedural states: Secondary | ICD-10-CM | POA: Insufficient documentation

## 2015-03-07 DIAGNOSIS — Z79899 Other long term (current) drug therapy: Secondary | ICD-10-CM | POA: Insufficient documentation

## 2015-03-07 DIAGNOSIS — H9190 Unspecified hearing loss, unspecified ear: Secondary | ICD-10-CM | POA: Insufficient documentation

## 2015-03-07 DIAGNOSIS — J441 Chronic obstructive pulmonary disease with (acute) exacerbation: Secondary | ICD-10-CM

## 2015-03-07 DIAGNOSIS — Z7982 Long term (current) use of aspirin: Secondary | ICD-10-CM | POA: Insufficient documentation

## 2015-03-07 DIAGNOSIS — I509 Heart failure, unspecified: Secondary | ICD-10-CM | POA: Diagnosis not present

## 2015-03-07 DIAGNOSIS — Z720 Tobacco use: Secondary | ICD-10-CM | POA: Insufficient documentation

## 2015-03-07 DIAGNOSIS — R0602 Shortness of breath: Secondary | ICD-10-CM | POA: Diagnosis present

## 2015-03-07 LAB — CBC WITH DIFFERENTIAL/PLATELET
Basophils Absolute: 0 10*3/uL (ref 0.0–0.1)
Basophils Relative: 0 % (ref 0–1)
Eosinophils Absolute: 0.6 10*3/uL (ref 0.0–0.7)
Eosinophils Relative: 5 % (ref 0–5)
HCT: 45.5 % (ref 39.0–52.0)
Hemoglobin: 15.2 g/dL (ref 13.0–17.0)
Lymphocytes Relative: 13 % (ref 12–46)
Lymphs Abs: 1.4 10*3/uL (ref 0.7–4.0)
MCH: 32.4 pg (ref 26.0–34.0)
MCHC: 33.4 g/dL (ref 30.0–36.0)
MCV: 97 fL (ref 78.0–100.0)
Monocytes Absolute: 1 10*3/uL (ref 0.1–1.0)
Monocytes Relative: 9 % (ref 3–12)
Neutro Abs: 7.9 10*3/uL — ABNORMAL HIGH (ref 1.7–7.7)
Neutrophils Relative %: 73 % (ref 43–77)
Platelets: 208 10*3/uL (ref 150–400)
RBC: 4.69 MIL/uL (ref 4.22–5.81)
RDW: 12.5 % (ref 11.5–15.5)
WBC: 10.9 10*3/uL — ABNORMAL HIGH (ref 4.0–10.5)

## 2015-03-07 LAB — BASIC METABOLIC PANEL
Anion gap: 10 (ref 5–15)
BUN: 17 mg/dL (ref 6–20)
CO2: 31 mmol/L (ref 22–32)
Calcium: 8.6 mg/dL — ABNORMAL LOW (ref 8.9–10.3)
Chloride: 93 mmol/L — ABNORMAL LOW (ref 101–111)
Creatinine, Ser: 0.72 mg/dL (ref 0.61–1.24)
GFR calc Af Amer: >60 mL/min (ref >60)
GFR calc non Af Amer: >60 mL/min (ref >60)
Glucose, Bld: 94 mg/dL (ref 65–99)
Potassium: 4.2 mmol/L (ref 3.5–5.1)
Sodium: 134 mmol/L — ABNORMAL LOW (ref 135–145)

## 2015-03-07 LAB — TROPONIN I: Troponin I: 0.05 ng/mL — ABNORMAL HIGH (ref <0.031)

## 2015-03-07 MED ORDER — ALBUTEROL SULFATE HFA 108 (90 BASE) MCG/ACT IN AERS
2.0000 | INHALATION_SPRAY | RESPIRATORY_TRACT | Status: DC | PRN
  Administered 2015-03-07: 2 via RESPIRATORY_TRACT
  Filled 2015-03-07: qty 6.7

## 2015-03-07 MED ORDER — IPRATROPIUM-ALBUTEROL 0.5-2.5 (3) MG/3ML IN SOLN
3.0000 mL | Freq: Once | RESPIRATORY_TRACT | Status: AC
  Administered 2015-03-07: 3 mL via RESPIRATORY_TRACT
  Filled 2015-03-07: qty 3

## 2015-03-07 MED ORDER — METHYLPREDNISOLONE SODIUM SUCC 125 MG IJ SOLR
80.0000 mg | Freq: Once | INTRAMUSCULAR | Status: AC
  Administered 2015-03-07: 80 mg via INTRAVENOUS
  Filled 2015-03-07: qty 2

## 2015-03-07 NOTE — ED Notes (Signed)
[place] Sandwich and coke provided.Marland KitchenMarland Kitchen

## 2015-03-07 NOTE — Discharge Instructions (Signed)

## 2015-03-07 NOTE — ED Provider Notes (Signed)
I saw and evaluated the patient, reviewed the resident's note and I agree with the findings and plan.   EKG Interpretation   Date/Time:  Thursday March 07 2015 18:11:46 EDT Ventricular Rate:  85 PR Interval:  141 QRS Duration: 109 QT Interval:  418 QTC Calculation: 497 R Axis:   -90 Text Interpretation:  normal sinus consider posterior infarct, acute (LCx)  Probable anterior infarct, age indeterminate Confirmed by Lorenna Lurry  MD,  Kathie Posa ([id number]) on 03/07/2015 6:28:12 PM      63yM with dyspnea. Clinically COPD exacerbation. Feels better after neb. Significant social issues contributing. Unable to afford meds. Given dose of steroids again in ED. Will discharge with albuterol MDI. Again counseled on need to stop smoking. Some EKG changes but clinically less likely cardiac etiology. Cath last year with essentially normal coronaries. Troponin minimally elevated, but trending down from recent a couple days ago. Discussed with Dr Terrence Dupont. Does not think that needs admission from cardiac standpoint. He has not followed-up with him since he evaluated pt in the hospital. Can see Mr. Terrio in his office tomorrow or early next week. Discussed with pt the need to follow-up with Dr Terrence Dupont.   Virgel Manifold, MD 03/07/15 2007

## 2015-03-07 NOTE — ED Notes (Signed)
[facility] has been called for pt. transportation

## 2015-03-07 NOTE — ED Provider Notes (Signed)
CSN: [id number]     Arrival date & time 03/07/15  1748 History   First MD Initiated Contact with Patient 03/07/15 1756     Chief Complaint  Patient presents with  . Shortness of Breath   HPI Anthony Bolton is a 64yo man with PMHx of COPD, hx tobacco abuse (70 pack years), and CHF (EF 20-25%) who presents to the ED with dyspnea. Patient was seen in the ED 2 days ago for a COPD exacerbation and improved after solu-medrol by EMS and duonebs. He states his dyspnea started today around 2 PM and continue to worsen. He reports he tried using his albuterol inhaler, but thinks it is empty. He received a duoneb by EMS. He was discharged from the hospital on 6/6 for a COPD exacerbation and was prescribed a prednisone taper but was unable to afford this as well as his other medications. He reports chest tightness, productive cough, and wheezing.    Past Medical History  Diagnosis Date  . COPD (chronic obstructive pulmonary disease)   . HOH (hard of hearing)   . CHF (congestive heart failure) EF 20 - 25% in 2015   Past Surgical History  Procedure Laterality Date  . Appendectomy    . Left heart catheterization with coronary angiogram N/A 11/07/2013    Procedure: LEFT HEART CATHETERIZATION WITH CORONARY ANGIOGRAM;  Surgeon: Clent Demark, MD;  Location: [facility] CATH LAB;  Service: Cardiovascular;  Laterality: N/A;  . Cardiac stents      No stent history   No family history on file. History  Substance Use Topics  . Smoking status: Current Every Day Smoker    Types: Cigarettes  . Smokeless tobacco: Never Used  . Alcohol Use: No    Review of Systems General: Denies fever, chills, night sweats, changes in weight, changes in appetite HEENT: Denies headaches, ear pain, changes in vision, rhinorrhea, sore throat CV: Denies CP, palpitations, SOB, orthopnea Pulm: Denies SOB, cough, wheezing GI: Denies abdominal pain, nausea, vomiting, diarrhea, constipation, melena, hematochezia GU: Denies dysuria, hematuria,  frequency Msk: Denies muscle cramps, joint pains Neuro: Denies weakness, numbness, tingling Skin: Denies rashes, bruising   Allergies  Review of patient's allergies indicates no known allergies.  Home Medications   Prior to Admission medications   Medication Sig Start Date End Date Taking? Authorizing Provider  albuterol (PROVENTIL) (2.5 MG/3ML) 0.083% nebulizer solution Take 3 mLs (2.5 mg total) by nebulization every 2 (two) hours as needed for wheezing or shortness of breath. 03/04/15   Maryann Mikhail, DO  aspirin 325 MG tablet Take 650 mg by mouth every 6 (six) hours as needed for mild pain or headache.     Historical Provider, MD  aspirin EC 81 MG EC tablet Take 1 tablet (81 mg total) by mouth daily. 03/04/15   Maryann Mikhail, DO  atorvastatin (LIPITOR) 40 MG tablet Take 1 tablet (40 mg total) by mouth at bedtime. 03/04/15   Maryann Mikhail, DO  budesonide (PULMICORT) 0.5 MG/2ML nebulizer solution Take 2 mLs (0.5 mg total) by nebulization every 6 (six) hours. 03/04/15   Maryann Mikhail, DO  doxycycline (VIBRA-TABS) 100 MG tablet Take 1 tablet (100 mg total) by mouth every 12 (twelve) hours. 03/04/15   Maryann Mikhail, DO  feeding supplement, ENSURE ENLIVE, (ENSURE ENLIVE) LIQD Take 237 mLs by mouth 2 (two) times daily between meals. 03/04/15   Maryann Mikhail, DO  guaiFENesin (MUCINEX) 600 MG 12 hr tablet Take 2 tablets (1,200 mg total) by mouth 2 (two) times daily. 03/04/15  Maryann Mikhail, DO  ipratropium-albuterol (DUONEB) 0.5-2.5 (3) MG/3ML SOLN Take 3 mLs by nebulization every 6 (six) hours. 03/04/15   Maryann Mikhail, DO  Melatonin 1 MG CAPS Take 2 capsules by mouth at bedtime.     Historical Provider, MD  Multiple Vitamin (MULTIVITAMIN WITH MINERALS) TABS tablet Take 1 tablet by mouth daily. 03/04/15   Maryann Mikhail, DO  predniSONE (DELTASONE) 10 MG tablet Take 1 tablet (10 mg total) by mouth daily with breakfast. Prednisone dosing: Take  Prednisone 40mg  (4 tabs) x 3 days, then taper to 30mg   (3 tabs) x 3 days, then 20mg  (2 tabs) x 3days, then 10mg  (1 tab) x 3days, then OFF. 03/04/15   Maryann Mikhail, DO  traZODone (DESYREL) 50 MG tablet Take 50 mg by mouth at bedtime.    Historical Provider, MD   BP 128/87 mmHg  Temp(Src) 97.6 F (36.4 C) (Oral)  Resp 25  SpO2 98% Physical Exam General: thin appearing man sitting up in bed, NAD HEENT: [place]/AT, EOMI, sclera anicteric, mucus membranes dry CV: RRR, no m/g/r Pulm: minimal end expiratory wheezes, breaths mildly labored on room air  Abd: BS+, soft, non-tender, non-distended Ext: warm, no edema Neuro: alert and oriented x 3. Hard of hearing. No focal deficits.   ED Course  Procedures (including critical care time) Labs Review Labs Reviewed  BASIC METABOLIC PANEL - Abnormal; Notable for the following:    Sodium 134 (*)    Chloride 93 (*)    Calcium 8.6 (*)    All other components within normal limits  TROPONIN I - Abnormal; Notable for the following:    Troponin I 0.05 (*)    All other components within normal limits  CBC WITH DIFFERENTIAL/PLATELET - Abnormal; Notable for the following:    WBC 10.9 (*)    Neutro Abs 7.9 (*)    All other components within normal limits    Imaging Review No results found.   EKG Interpretation   Date/Time:  Thursday March 07 2015 18:11:46 EDT Ventricular Rate:  85 PR Interval:  141 QRS Duration: 109 QT Interval:  418 QTC Calculation: 497 R Axis:   -90 Text Interpretation:  normal sinus consider posterior infarct, acute (LCx)  Probable anterior infarct, age indeterminate Confirmed by [place]  MD,  [place] ([id number]) on 03/07/2015 6:28:12 PM      MDM   Final diagnoses:  COPD exacerbation    64yo man presenting with dyspnea and chest tightness with recent hospitalization and ED visit for COPD exacerbation. His breathing has improved after 1 duoneb with only mild expiratory wheezes on exam. Will give another duoneb treatment. His chest tightness is likely related to his COPD, but his EKG  is concerning with T wave inversions and ST depressions in the anterior leads which has changed from his prior EKG two days ago. His troponin was mildly elevated at 0.23 on 6/7 during his last visit. Will discuss with Dr. Terrence Dupont (his cardiologist) about EKG changes and if concerning for possible posterior MI. Will get another troponin.   Troponin minimally elevated at 0.05. Dr. Wilson Singer spoke to Dr. Terrence Dupont and patient is to be seen as an outpatient with cardiology. Patient's breathing has significantly improved and no longer wheezing. Will send home with an albuterol inhaler.      Juliet Rude, MD 03/07/15 9622  Virgel Manifold, MD 03/13/15 [phone]

## 2015-03-07 NOTE — Telephone Encounter (Signed)
-----   Message from Rolm Baptise, RN sent at 03/06/2015  1:41 PM EDT ----- Regarding: RE: Outpatient follow-up. I do not believe there is any preference.  I was actually just instructed to send the message by a case manager who deals with COPD patients more frequently.  I guess it is part of the COPD protocol.  Thanks! ----- Message -----    From: Glean Hess, CMA    Sent: 03/06/2015  10:10 AM      To: Rolm Baptise, RN Subject: RE: Outpatient follow-up.                      Received message, doesn't look like any of our providers has seen this patient, did Imogene Burn, NP have a preference as to which doctor to schedule this patient with?  Please advise. ----- Message -----    From: Rolm Baptise, RN    Sent: 03/01/2015   2:47 PM      To: [facility] Subject: Outpatient follow-up.                          Pt is currently admitted. Will need pulmonary follow up outpatient in the next 5-7 days. Per Imogene Burn, NP

## 2015-03-07 NOTE — Telephone Encounter (Signed)
Attempted to contact patient to schedule appointment,  Phone rang busy.  Will call back later.

## 2015-03-07 NOTE — ED Notes (Signed)
Pt feeling SOB. Hx COPD. EMS reports wheezing. EMS reports pt had bed bugs two days ago when seen here. EMS gave duoneb. BP 140/106, HR 85, 100% on treatment

## 2015-03-08 NOTE — Telephone Encounter (Signed)
Attempted to call patient, phone rang busy.  WCB.

## 2015-03-11 NOTE — Telephone Encounter (Signed)
Attempted to contact patient and phone number has been disconnected.    To Courtney:  I have attempted to reach this patient, but have not been able to reach him to schedule appointment.

## 2015-03-12 LAB — CULTURE, BLOOD (ROUTINE X 2)
CULTURE: NO GROWTH
Culture: NO GROWTH

## 2015-03-13 ENCOUNTER — Emergency Department (HOSPITAL_COMMUNITY): Payer: Medicaid Other

## 2015-03-13 ENCOUNTER — Encounter (HOSPITAL_COMMUNITY): Payer: Self-pay | Admitting: *Deleted

## 2015-03-13 ENCOUNTER — Emergency Department (HOSPITAL_COMMUNITY)
Admission: EM | Admit: 2015-03-13 | Discharge: 2015-03-14 | Disposition: A | Discharge status: Home / Self Care | Payer: Medicaid Other | Attending: Emergency Medicine | Admitting: Emergency Medicine

## 2015-03-13 DIAGNOSIS — J441 Chronic obstructive pulmonary disease with (acute) exacerbation: Secondary | ICD-10-CM | POA: Insufficient documentation

## 2015-03-13 DIAGNOSIS — J449 Chronic obstructive pulmonary disease, unspecified: Secondary | ICD-10-CM

## 2015-03-13 DIAGNOSIS — Z79899 Other long term (current) drug therapy: Secondary | ICD-10-CM | POA: Diagnosis not present

## 2015-03-13 DIAGNOSIS — H9190 Unspecified hearing loss, unspecified ear: Secondary | ICD-10-CM | POA: Diagnosis not present

## 2015-03-13 DIAGNOSIS — Z720 Tobacco use: Secondary | ICD-10-CM | POA: Insufficient documentation

## 2015-03-13 DIAGNOSIS — I509 Heart failure, unspecified: Secondary | ICD-10-CM | POA: Diagnosis not present

## 2015-03-13 DIAGNOSIS — R0602 Shortness of breath: Secondary | ICD-10-CM

## 2015-03-13 LAB — BASIC METABOLIC PANEL
ANION GAP: 8 (ref 5–15)
BUN: 28 mg/dL — ABNORMAL HIGH (ref 6–20)
CO2: 25 mmol/L (ref 22–32)
CREATININE: 0.87 mg/dL (ref 0.61–1.24)
Calcium: 8.4 mg/dL — ABNORMAL LOW (ref 8.9–10.3)
Chloride: 107 mmol/L (ref 101–111)
GFR calc non Af Amer: >60 mL/min (ref >60)
Glucose, Bld: 104 mg/dL — ABNORMAL HIGH (ref 65–99)
Potassium: 3.6 mmol/L (ref 3.5–5.1)
SODIUM: 140 mmol/L (ref 135–145)

## 2015-03-13 LAB — CBC
HCT: 38.9 % — ABNORMAL LOW (ref 39.0–52.0)
Hemoglobin: 12.5 g/dL — ABNORMAL LOW (ref 13.0–17.0)
MCH: 31.7 pg (ref 26.0–34.0)
MCHC: 32.1 g/dL (ref 30.0–36.0)
MCV: 98.7 fL (ref 78.0–100.0)
Platelets: 209 10*3/uL (ref 150–400)
RBC: 3.94 MIL/uL — AB (ref 4.22–5.81)
RDW: 12.8 % (ref 11.5–15.5)
WBC: 10 10*3/uL (ref 4.0–10.5)

## 2015-03-13 LAB — I-STAT TROPONIN, ED: Troponin i, poc: 0.01 ng/mL (ref 0.00–0.08)

## 2015-03-13 LAB — BRAIN NATRIURETIC PEPTIDE: B Natriuretic Peptide: 22.7 pg/mL (ref 0.0–100.0)

## 2015-03-13 MED ORDER — ALBUTEROL (5 MG/ML) CONTINUOUS INHALATION SOLN
10.0000 mg/h | INHALATION_SOLUTION | Freq: Once | RESPIRATORY_TRACT | Status: AC
  Administered 2015-03-13: 10 mg/h via RESPIRATORY_TRACT
  Filled 2015-03-13: qty 20

## 2015-03-13 MED ORDER — IPRATROPIUM BROMIDE 0.02 % IN SOLN
0.5000 mg | Freq: Once | RESPIRATORY_TRACT | Status: AC
Start: 2015-03-13 — End: 2015-03-13
  Administered 2015-03-13: 0.5 mg via RESPIRATORY_TRACT
  Filled 2015-03-13: qty 2.5

## 2015-03-13 NOTE — Progress Notes (Signed)
CSW reached out to DSS to make them aware of the pt. CSW will file an APS report. CSW is awaiting a call back.  Willette Brace [phone] ED CSW 03/13/2015 10:51 PM

## 2015-03-13 NOTE — ED Provider Notes (Signed)
Presented with shortness of breath and wheezing. Patient states reading normal after treatment in the ED. No respiratory distress lungs clear auscultation. X-ray viewed by me. Results for orders placed or performed during the hospital encounter of 03/13/15  CBC  Result Value Ref Range   WBC 10.0 4.0 - 10.5 K/uL   RBC 3.94 (L) 4.22 - 5.81 MIL/uL   Hemoglobin 12.5 (L) 13.0 - 17.0 g/dL   HCT 38.9 (L) 39.0 - 52.0 %   MCV 98.7 78.0 - 100.0 fL   MCH 31.7 26.0 - 34.0 pg   MCHC 32.1 30.0 - 36.0 g/dL   RDW 12.8 11.5 - 15.5 %   Platelets 209 150 - 400 K/uL  Basic metabolic panel  Result Value Ref Range   Sodium 140 135 - 145 mmol/L   Potassium 3.6 3.5 - 5.1 mmol/L   Chloride 107 101 - 111 mmol/L   CO2 25 22 - 32 mmol/L   Glucose, Bld 104 (H) 65 - 99 mg/dL   BUN 28 (H) 6 - 20 mg/dL   Creatinine, Ser 0.87 0.61 - 1.24 mg/dL   Calcium 8.4 (L) 8.9 - 10.3 mg/dL   GFR calc non Af Amer >60 >60 mL/min   GFR calc Af Amer >60 >60 mL/min   Anion gap 8 5 - 15  BNP (order ONLY if patient complains of dyspnea/SOB AND you have documented it for THIS visit)  Result Value Ref Range   B Natriuretic Peptide 22.7 0.0 - 100.0 pg/mL  I-stat troponin, ED  (not at [facility])  Result Value Ref Range   Troponin i, poc 0.01 0.00 - 0.08 ng/mL   Comment 3           Dg Chest 2 View  03/05/2015   CLINICAL DATA:  Dyspnea and chest tightness.  EXAM: CHEST  2 VIEW  COMPARISON:  03/02/2015  FINDINGS: There is hyperinflation and findings are suggestive for emphysema. No focal airspace disease. Heart and mediastinum are within normal limits. Suspect old injury involving the lateral left clavicle.  IMPRESSION: No acute chest findings.  Hyperinflation is suggestive for emphysema.   Electronically Signed   By: Markus Daft M.D.   On: 03/05/2015 18:01   Dg Chest Portable 1 View  03/13/2015   CLINICAL DATA:  Shortness of breath and wheezing for 24 hours.  EXAM: PORTABLE CHEST - 1 VIEW  COMPARISON:  Numerous prior chest x-rays. The  most recent is 03/05/2015  FINDINGS: The cardiac silhouette, mediastinal and hilar contours are normal and stable. Stable emphysematous changes. No acute pulmonary findings. Bilateral nipple shadows are noted. The bony thorax is intact.  IMPRESSION: Emphysematous changes but no acute overlying pulmonary process.   Electronically Signed   By: Marijo Sanes M.D.   On: 03/13/2015 21:49   Dg Chest Port 1 View  03/02/2015   CLINICAL DATA:  Pneumonia.  EXAM: PORTABLE CHEST - 1 VIEW  COMPARISON:  03/01/2015  FINDINGS: Cardiac silhouette normal in size and configuration. No mediastinal or hilar masses or evidence of adenopathy.  Lungs are hyperexpanded. There is mild interstitial thickening most evident in the lung bases. Relative lucency in the upper lobes is consistent with emphysema. These findings are stable. There is no lung consolidation or edema. No pleural effusion or pneumothorax.  Bony thorax is grossly intact.  IMPRESSION: 1. No acute cardiopulmonary disease. 2. COPD.   Electronically Signed   By: Lajean Manes M.D.   On: 03/02/2015 09:36   Dg Chest [facility]  03/01/2015   CLINICAL DATA:  Shortness of breath and chest pain  EXAM: PORTABLE CHEST - 1 VIEW  COMPARISON:  Feb 24, 2015 and Feb 20, 2015  FINDINGS: Lungs are somewhat hyperexpanded but clear. The heart size and pulmonary vascularity are within normal limits. No adenopathy. No bone lesions.  IMPRESSION: No edema or consolidation.   Electronically Signed   By: Lowella Grip III M.D.   On: 03/01/2015 07:31   Dg Chest Port 1 View  02/24/2015   CLINICAL DATA:  Acute onset of shortness of breath and generalized chest pain. Initial encounter.  EXAM: PORTABLE CHEST - 1 VIEW  COMPARISON:  Chest radiograph performed 02/20/2015  FINDINGS: The lungs are well-aerated. Minimal right lower lung zone airspace opacity may reflect mild pneumonia. There is no evidence of pleural effusion or pneumothorax.  The cardiomediastinal silhouette is within normal  limits. No acute osseous abnormalities are seen. There is chronic fragmentation involving the distal left clavicle.  IMPRESSION: Minimal right lower lung zone airspace opacity may reflect mild pneumonia.   Electronically Signed   By: Garald Balding M.D.   On: 02/24/2015 00:23   Dg Chest Port 1 View  02/20/2015   CLINICAL DATA:  Shortness of breath.  COPD.  EXAM: PORTABLE CHEST - 1 VIEW  COMPARISON:  01/14/2015  FINDINGS: Cardiomediastinal silhouette is within normal limits. The lungs are hyperinflated consistent with COPD. No airspace consolidation, edema, pleural effusion, or pneumothorax is identified. No acute osseous abnormality is seen.  IMPRESSION: COPD without evidence of acute airspace disease.   Electronically Signed   By: Logan Bores   On: 02/20/2015 20:10     Orlie Dakin, MD 03/13/15 2350

## 2015-03-13 NOTE — ED Notes (Signed)
Pt noted to have small bugs crawling on him, admits to caring for himself and lives alone - Education officer, museum consult initiated.

## 2015-03-13 NOTE — Progress Notes (Signed)
CSW filed APS report with [employer] regarding possible self neglect.  Willette Brace [phone] ED CSW 03/13/2015 03/13/2015 11:10 PM

## 2015-03-13 NOTE — Progress Notes (Addendum)
Per chart review, patient has an appointment with his pcp Dr. Criss Rosales on 06/16 at 1130am.  Patient also has appointment with Dr. Melvyn Novas 06/17 at 1130.  Patient has been admitted and discharged from 06/03 to 06/06.

## 2015-03-13 NOTE — ED Notes (Signed)
RT notified of need for breathing treatment

## 2015-03-13 NOTE — ED Notes (Signed)
Per [facility] - pt from home w/ c/o shortness of breath and chest tightness, pt w/ hx of COPD and exhibiting bilat wheezing and diminished lung bases. Pt administered 5mg  albuterol/0.5mg  atrovent and 125mg  solumedrol en route by EMS. Pt initially 93% SPO2 on RA and is now 99% on neb tx. Pt admits to substernal chest pain that began approx 1400 described as a throbbing pain, pt w/ hx of stents.

## 2015-03-13 NOTE — ED Notes (Signed)
Per protocol bugs collected in specimen cup for EVS inspection

## 2015-03-13 NOTE — Progress Notes (Signed)
EDCM noted patient to have visited the ED 12 times within the last six moths.  Prior to going to see patient in room 13, pharmacy tech informed [facility] that "bed bugs were all over the sheets."  Per chart review, home health agency will not visit patient in the home due to bed bug infestation.  [facility] asked patient if he had an exterminator come to his house for the bed bugs?  Patient responded, "no."  EDCM asked patient why he was still wearing the scrubs he was discharged in?  Patient stated, "Well, I haven't had a shower in 2 days.  I only shower when I need to shave which is every other day. Today was shopping day, so I didn't get one."  Patient reports he went food shopping today.  Patient reports that he has a nebulizer machine at home.  Patient is aware he has a pcp appointment with Dr. Criss Rosales tomorrow and appointment with Dr. Melvyn Novas on 06/17, reports he has it written down in black wallet/book at bedside.  Discussed patient with EDPA,  EDRN, EDSW.  Have asked EDSW to file APS report.  No further EDCM needs at this time.

## 2015-03-13 NOTE — ED Provider Notes (Signed)
CSN: [id number]     Arrival date & time 03/13/15  2005 History   First MD Initiated Contact with Patient 03/13/15 2129     Chief Complaint  Patient presents with  . Shortness of Breath  . Wheezing     (Consider location/radiation/quality/duration/timing/severity/associated sxs/prior Treatment) HPI Anthony Bolton is a 64 y.o. male with history of COPD and CHF, presents to emergency department complaining of shortness of breath and chest tightness. Patient states symptoms started at 2 PM this afternoon. He was seen for the same 4 days ago. States he felt better at time of discharge. Patient states he is currently taking nebulized treatments every 6 hours. He states pain in her chest comes and goes lasting several seconds.   Past Medical History  Diagnosis Date  . COPD (chronic obstructive pulmonary disease)   . HOH (hard of hearing)   . CHF (congestive heart failure) EF 20 - 25% in 2015   Past Surgical History  Procedure Laterality Date  . Appendectomy    . Left heart catheterization with coronary angiogram N/A 11/07/2013    Procedure: LEFT HEART CATHETERIZATION WITH CORONARY ANGIOGRAM;  Surgeon: Clent Demark, MD;  Location: [facility] CATH LAB;  Service: Cardiovascular;  Laterality: N/A;  . Cardiac stents      No stent history   History reviewed. No pertinent family history. History  Substance Use Topics  . Smoking status: Current Every Day Smoker    Types: Cigarettes  . Smokeless tobacco: Never Used  . Alcohol Use: No    Review of Systems  Constitutional: Negative for fever and chills.  Respiratory: Positive for chest tightness, shortness of breath and wheezing. Negative for cough.   Cardiovascular: Positive for chest pain. Negative for palpitations and leg swelling.  Gastrointestinal: Negative for nausea, vomiting, abdominal pain, diarrhea and abdominal distention.  Genitourinary: Negative for dysuria, urgency, frequency and hematuria.  Musculoskeletal: Negative for myalgias,  arthralgias, neck pain and neck stiffness.  Skin: Negative for rash.  Allergic/Immunologic: Negative for immunocompromised state.  Neurological: Negative for dizziness, weakness, light-headedness, numbness and headaches.  All other systems reviewed and are negative.     Allergies  Review of patient's allergies indicates no known allergies.  Home Medications   Prior to Admission medications   Medication Sig Start Date End Date Taking? Authorizing Provider  albuterol (PROVENTIL) (2.5 MG/3ML) 0.083% nebulizer solution Take 3 mLs (2.5 mg total) by nebulization every 2 (two) hours as needed for wheezing or shortness of breath. 03/04/15   Maryann Mikhail, DO  aspirin EC 81 MG EC tablet Take 1 tablet (81 mg total) by mouth daily. 03/04/15   Maryann Mikhail, DO  atorvastatin (LIPITOR) 40 MG tablet Take 1 tablet (40 mg total) by mouth at bedtime. 03/04/15   Maryann Mikhail, DO  budesonide (PULMICORT) 0.5 MG/2ML nebulizer solution Take 2 mLs (0.5 mg total) by nebulization every 6 (six) hours. 03/04/15   Maryann Mikhail, DO  doxycycline (VIBRA-TABS) 100 MG tablet Take 1 tablet (100 mg total) by mouth every 12 (twelve) hours. 03/04/15   Maryann Mikhail, DO  feeding supplement, ENSURE ENLIVE, (ENSURE ENLIVE) LIQD Take 237 mLs by mouth 2 (two) times daily between meals. 03/04/15   Maryann Mikhail, DO  guaiFENesin (MUCINEX) 600 MG 12 hr tablet Take 2 tablets (1,200 mg total) by mouth 2 (two) times daily. 03/04/15   Maryann Mikhail, DO  ipratropium-albuterol (DUONEB) 0.5-2.5 (3) MG/3ML SOLN Take 3 mLs by nebulization every 6 (six) hours. 03/04/15   Maryann Mikhail, DO  Melatonin 1  MG CAPS Take 2 capsules by mouth at bedtime.     Historical Provider, MD  Multiple Vitamin (MULTIVITAMIN WITH MINERALS) TABS tablet Take 1 tablet by mouth daily. 03/04/15   Maryann Mikhail, DO  predniSONE (DELTASONE) 10 MG tablet Take 1 tablet (10 mg total) by mouth daily with breakfast. Prednisone dosing: Take  Prednisone 40mg  (4 tabs) x 3 days,  then taper to 30mg  (3 tabs) x 3 days, then 20mg  (2 tabs) x 3days, then 10mg  (1 tab) x 3days, then OFF. 03/04/15   Maryann Mikhail, DO  traZODone (DESYREL) 50 MG tablet Take 50 mg by mouth at bedtime.    Historical Provider, MD   BP 110/80 mmHg  Pulse 94  Temp(Src) 98.3 F (36.8 C) (Oral)  SpO2 100% Physical Exam  Constitutional: He is oriented to person, place, and time. He appears well-developed and well-nourished. No distress.  HENT:  Head: Normocephalic and atraumatic.  Eyes: Conjunctivae are normal.  Neck: Neck supple.  Cardiovascular: Normal rate, regular rhythm and normal heart sounds.   Pulmonary/Chest: Effort normal. No respiratory distress. He has no wheezes. He has no rales.  Decreased air movement in all fields  Abdominal: Soft. Bowel sounds are normal. He exhibits no distension. There is no tenderness. There is no rebound.  Musculoskeletal: He exhibits no edema.  Neurological: He is alert and oriented to person, place, and time.  Skin: Skin is warm and dry.  Nursing note and vitals reviewed.   ED Course  Procedures (including critical care time) Labs Review Labs Reviewed  CBC - Abnormal; Notable for the following:    RBC 3.94 (*)    Hemoglobin 12.5 (*)    HCT 38.9 (*)    All other components within normal limits  BASIC METABOLIC PANEL - Abnormal; Notable for the following:    Glucose, Bld 104 (*)    BUN 28 (*)    Calcium 8.4 (*)    All other components within normal limits  BRAIN NATRIURETIC PEPTIDE  I-STAT TROPOININ, ED    Imaging Review Dg Chest Portable 1 View  03/13/2015   CLINICAL DATA:  Shortness of breath and wheezing for 24 hours.  EXAM: PORTABLE CHEST - 1 VIEW  COMPARISON:  Numerous prior chest x-rays. The most recent is 03/05/2015  FINDINGS: The cardiac silhouette, mediastinal and hilar contours are normal and stable. Stable emphysematous changes. No acute pulmonary findings. Bilateral nipple shadows are noted. The bony thorax is intact.  IMPRESSION:  Emphysematous changes but no acute overlying pulmonary process.   Electronically Signed   By: Marijo Sanes M.D.   On: 03/13/2015 21:49     EKG Interpretation None      MDM   Final diagnoses:  Shortness of breath  Chronic obstructive pulmonary disease, unspecified COPD, unspecified chronic bronchitis type   patient was chest tightness and shortness of breath. History of the same. History of COPD, multiple visits here for the same. Vital signs are normal, oxygen saturation 100% on room air. Initially with wheezing, which improved with first breathing treatment by EMS. Will given hour-long treatment, currently just decreased air movement in all lung fields. Patient appears to be comfortable with no respiratory distress. Labs and chest x-ray ordered.  11:54 PM Patient feels much better after his hour-long treatment. He states his breathing is normal at this time. He was seen by a case manager because of concern of his hygiene. Patient looks like he is wearing same scrubs that he was discharged and 4 days ago, patient's sheets have  crawling bedbugs on them. Patient apparently is functional, able to go to his door and obtain groceries, able to do chores around the house. Home health care does not come to his house due to bedbugs infestation. Case manager will contact Adult protective services to come out to his house. At this time patient is stable for discharge home. His labs are unremarkable for an acute process. He was given return precautions.  Filed Vitals:   03/13/15 2011 03/13/15 2013  BP:  110/80  Pulse:  94  Temp:  98.3 F (36.8 C)  TempSrc:  Oral  SpO2: 99% 100%      Jeannett Senior, PA-C 03/14/15 [place], MD 03/15/15 [id number]

## 2015-03-13 NOTE — ED Notes (Signed)
Bed: RESB Expected date:  Expected time:  Means of arrival:  Comments: EMS 64 yo male/SOB/wheezing all fields/neb-hx COPD

## 2015-03-13 NOTE — ED Notes (Signed)
Bed: WA13 Expected date:  Expected time:  Means of arrival:  Comments: 

## 2015-03-13 NOTE — ED Notes (Signed)
[place], Education officer, museum at patient's beside

## 2015-03-14 NOTE — ED Notes (Signed)
Pt ambulating independently w/ steady gait on d/c in no acute distress, A&Ox4.  

## 2015-03-14 NOTE — Discharge Instructions (Signed)
Continue breathing treatment every 6 hrs. Follow up with your primary care doctor as needed.   Chronic Obstructive Pulmonary Disease Chronic obstructive pulmonary disease (COPD) is a common lung condition in which airflow from the lungs is limited. COPD is a general term that can be used to describe many different lung problems that limit airflow, including both chronic bronchitis and emphysema. If you have COPD, your lung function will probably never return to normal, but there are measures you can take to improve lung function and make yourself feel better.  CAUSES   Smoking (common).   Exposure to secondhand smoke.   Genetic problems.  Chronic inflammatory lung diseases or recurrent infections. SYMPTOMS   Shortness of breath, especially with physical activity.   Deep, persistent (chronic) cough with a large amount of thick mucus.   Wheezing.   Rapid breaths (tachypnea).   Gray or bluish discoloration (cyanosis) of the skin, especially in fingers, toes, or lips.   Fatigue.   Weight loss.   Frequent infections or episodes when breathing symptoms become much worse (exacerbations).   Chest tightness. DIAGNOSIS  Your health care provider will take a medical history and perform a physical examination to make the initial diagnosis. Additional tests for COPD may include:   Lung (pulmonary) function tests.  Chest X-ray.  CT scan.  Blood tests. TREATMENT  Treatment available to help you feel better when you have COPD includes:   Inhaler and nebulizer medicines. These help manage the symptoms of COPD and make your breathing more comfortable.  Supplemental oxygen. Supplemental oxygen is only helpful if you have a low oxygen level in your blood.   Exercise and physical activity. These are beneficial for nearly all people with COPD. Some people may also benefit from a pulmonary rehabilitation program. HOME CARE INSTRUCTIONS   Take all medicines (inhaled or pills)  as directed by your health care provider.  Avoid over-the-counter medicines or cough syrups that dry up your airway (such as antihistamines) and slow down the elimination of secretions unless instructed otherwise by your health care provider.   If you are a smoker, the most important thing that you can do is stop smoking. Continuing to smoke will cause further lung damage and breathing trouble. Ask your health care provider for help with quitting smoking. He or she can direct you to community resources or hospitals that provide support.  Avoid exposure to irritants such as smoke, chemicals, and fumes that aggravate your breathing.  Use oxygen therapy and pulmonary rehabilitation if directed by your health care provider. If you require home oxygen therapy, ask your health care provider whether you should purchase a pulse oximeter to measure your oxygen level at home.   Avoid contact with individuals who have a contagious illness.  Avoid extreme temperature and humidity changes.  Eat healthy foods. Eating smaller, more frequent meals and resting before meals may help you maintain your strength.  Stay active, but balance activity with periods of rest. Exercise and physical activity will help you maintain your ability to do things you want to do.  Preventing infection and hospitalization is very important when you have COPD. Make sure to receive all the vaccines your health care provider recommends, especially the pneumococcal and influenza vaccines. Ask your health care provider whether you need a pneumonia vaccine.  Learn and use relaxation techniques to manage stress.  Learn and use controlled breathing techniques as directed by your health care provider. Controlled breathing techniques include:   Pursed lip breathing. Start by  breathing in (inhaling) through your nose for 1 second. Then, purse your lips as if you were going to whistle and breathe out (exhale) through the pursed lips for 2  seconds.   Diaphragmatic breathing. Start by putting one hand on your abdomen just above your waist. Inhale slowly through your nose. The hand on your abdomen should move out. Then purse your lips and exhale slowly. You should be able to feel the hand on your abdomen moving in as you exhale.   Learn and use controlled coughing to clear mucus from your lungs. Controlled coughing is a series of short, progressive coughs. The steps of controlled coughing are:  1. Lean your head slightly forward.  2. Breathe in deeply using diaphragmatic breathing.  3. Try to hold your breath for 3 seconds.  4. Keep your mouth slightly open while coughing twice.  5. Spit any mucus out into a tissue.  6. Rest and repeat the steps once or twice as needed. SEEK MEDICAL CARE IF:   You are coughing up more mucus than usual.   There is a change in the color or thickness of your mucus.   Your breathing is more labored than usual.   Your breathing is faster than usual.  SEEK IMMEDIATE MEDICAL CARE IF:   You have shortness of breath while you are resting.   You have shortness of breath that prevents you from:  Being able to talk.   Performing your usual physical activities.   You have chest pain lasting longer than 5 minutes.   Your skin color is more cyanotic than usual.  You measure low oxygen saturations for longer than 5 minutes with a pulse oximeter. MAKE SURE YOU:   Understand these instructions.  Will watch your condition.  Will get help right away if you are not doing well or get worse. Document Released: 06/24/2005 Document Revised: 01/29/2014 Document Reviewed: 05/11/2013 [facility] Patient Information 2015 [place], [place]. This information is not intended to replace advice given to you by your health care provider. Make sure you discuss any questions you have with your health care provider.

## 2015-03-15 ENCOUNTER — Institutional Professional Consult (permissible substitution): Payer: Medicaid Other | Admitting: Internal Medicine

## 2015-03-25 ENCOUNTER — Emergency Department (HOSPITAL_COMMUNITY)
Admission: EM | Admit: 2015-03-25 | Discharge: 2015-03-25 | Disposition: A | Discharge status: Home / Self Care | Payer: Medicaid Other | Attending: Emergency Medicine | Admitting: Emergency Medicine

## 2015-03-25 ENCOUNTER — Encounter (HOSPITAL_COMMUNITY): Payer: Self-pay | Admitting: Emergency Medicine

## 2015-03-25 ENCOUNTER — Emergency Department (HOSPITAL_COMMUNITY): Payer: Medicaid Other

## 2015-03-25 DIAGNOSIS — Z9889 Other specified postprocedural states: Secondary | ICD-10-CM | POA: Insufficient documentation

## 2015-03-25 DIAGNOSIS — I509 Heart failure, unspecified: Secondary | ICD-10-CM | POA: Diagnosis not present

## 2015-03-25 DIAGNOSIS — R079 Chest pain, unspecified: Secondary | ICD-10-CM | POA: Diagnosis not present

## 2015-03-25 DIAGNOSIS — R0602 Shortness of breath: Secondary | ICD-10-CM | POA: Diagnosis present

## 2015-03-25 DIAGNOSIS — Z79899 Other long term (current) drug therapy: Secondary | ICD-10-CM | POA: Insufficient documentation

## 2015-03-25 DIAGNOSIS — R0600 Dyspnea, unspecified: Secondary | ICD-10-CM

## 2015-03-25 DIAGNOSIS — H9190 Unspecified hearing loss, unspecified ear: Secondary | ICD-10-CM | POA: Diagnosis not present

## 2015-03-25 DIAGNOSIS — J441 Chronic obstructive pulmonary disease with (acute) exacerbation: Secondary | ICD-10-CM | POA: Diagnosis not present

## 2015-03-25 DIAGNOSIS — Z720 Tobacco use: Secondary | ICD-10-CM | POA: Diagnosis not present

## 2015-03-25 DIAGNOSIS — Z7982 Long term (current) use of aspirin: Secondary | ICD-10-CM | POA: Diagnosis not present

## 2015-03-25 LAB — CBC
HEMATOCRIT: 38.5 % — AB (ref 39.0–52.0)
HEMOGLOBIN: 12.4 g/dL — AB (ref 13.0–17.0)
MCH: 31.2 pg (ref 26.0–34.0)
MCHC: 32.2 g/dL (ref 30.0–36.0)
MCV: 97 fL (ref 78.0–100.0)
Platelets: 130 10*3/uL — ABNORMAL LOW (ref 150–400)
RBC: 3.97 MIL/uL — AB (ref 4.22–5.81)
RDW: 12.7 % (ref 11.5–15.5)
WBC: 6.2 10*3/uL (ref 4.0–10.5)

## 2015-03-25 LAB — BASIC METABOLIC PANEL
ANION GAP: 9 (ref 5–15)
BUN: 15 mg/dL (ref 6–20)
CALCIUM: 8.5 mg/dL — AB (ref 8.9–10.3)
CHLORIDE: 104 mmol/L (ref 101–111)
CO2: 30 mmol/L (ref 22–32)
CREATININE: 0.76 mg/dL (ref 0.61–1.24)
GFR calc Af Amer: >60 mL/min (ref >60)
GFR calc non Af Amer: >60 mL/min (ref >60)
GLUCOSE: 87 mg/dL (ref 65–99)
Potassium: 4 mmol/L (ref 3.5–5.1)
Sodium: 143 mmol/L (ref 135–145)

## 2015-03-25 LAB — I-STAT TROPONIN, ED: TROPONIN I, POC: 0 ng/mL (ref 0.00–0.08)

## 2015-03-25 LAB — BRAIN NATRIURETIC PEPTIDE: B Natriuretic Peptide: 145.2 pg/mL — ABNORMAL HIGH (ref 0.0–100.0)

## 2015-03-25 LAB — D-DIMER, QUANTITATIVE: D-Dimer, Quant: 2.17 ug/mL-FEU — ABNORMAL HIGH (ref 0.00–0.48)

## 2015-03-25 MED ORDER — ALBUTEROL SULFATE (2.5 MG/3ML) 0.083% IN NEBU
5.0000 mg | INHALATION_SOLUTION | Freq: Once | RESPIRATORY_TRACT | Status: AC
  Administered 2015-03-25: 5 mg via RESPIRATORY_TRACT
  Filled 2015-03-25: qty 6

## 2015-03-25 MED ORDER — IOHEXOL 350 MG/ML SOLN
100.0000 mL | Freq: Once | INTRAVENOUS | Status: AC | PRN
  Administered 2015-03-25: 100 mL via INTRAVENOUS

## 2015-03-25 MED ORDER — METHYLPREDNISOLONE SODIUM SUCC 125 MG IJ SOLR
125.0000 mg | Freq: Once | INTRAMUSCULAR | Status: AC
  Administered 2015-03-25: 125 mg via INTRAVENOUS
  Filled 2015-03-25: qty 2

## 2015-03-25 MED ORDER — IPRATROPIUM-ALBUTEROL 0.5-2.5 (3) MG/3ML IN SOLN
3.0000 mL | Freq: Once | RESPIRATORY_TRACT | Status: AC
  Administered 2015-03-25: 3 mL via RESPIRATORY_TRACT
  Filled 2015-03-25: qty 3

## 2015-03-25 NOTE — ED Notes (Signed)
Patient transported to CT 

## 2015-03-25 NOTE — ED Notes (Signed)
Per EMS pt comes from home for difficulty breathing, pt's lung sounds where clear so pt wasn't given neb treatment. Pt was put on O2 via [place] PRN in route. Pt O2 97% on room air. 138/80, 20rr, [id number].

## 2015-03-25 NOTE — Discharge Instructions (Signed)

## 2015-03-25 NOTE — ED Notes (Signed)
Made Respiratory aware of neb treatment ordered.  

## 2015-03-25 NOTE — ED Provider Notes (Signed)
CSN: [id number]     Arrival date & time 03/25/15  1711 History   First MD Initiated Contact with Patient 03/25/15 1826     Chief Complaint  Patient presents with  . Shortness of Breath     (Consider location/radiation/quality/duration/timing/severity/associated sxs/prior Treatment) HPI Comments: Pt notes chest tightness with coughing, no anginal qualities  Patient is a 65 y.o. male presenting with shortness of breath. The history is provided by the patient.  Shortness of Breath Severity:  Moderate Onset quality:  Gradual Duration:  1 day Timing:  Constant Progression:  Waxing and waning Chronicity:  Recurrent Context: activity   Relieved by:  Nothing Worsened by:  Exertion Associated symptoms: chest pain, cough and wheezing   Associated symptoms: no diaphoresis, no fever, no PND and no vomiting     Past Medical History  Diagnosis Date  . COPD (chronic obstructive pulmonary disease)   . HOH (hard of hearing)   . CHF (congestive heart failure) EF 20 - 25% in 2015   Past Surgical History  Procedure Laterality Date  . Appendectomy    . Left heart catheterization with coronary angiogram N/A 11/07/2013    Procedure: LEFT HEART CATHETERIZATION WITH CORONARY ANGIOGRAM;  Surgeon: Clent Demark, MD;  Location: [facility] CATH LAB;  Service: Cardiovascular;  Laterality: N/A;  . Cardiac stents      No stent history   No family history on file. History  Substance Use Topics  . Smoking status: Current Every Day Smoker    Types: Cigarettes  . Smokeless tobacco: Never Used  . Alcohol Use: No    Review of Systems  Constitutional: Negative for fever and diaphoresis.  Respiratory: Positive for cough, shortness of breath and wheezing.   Cardiovascular: Positive for chest pain. Negative for PND.  Gastrointestinal: Negative for vomiting.  All other systems reviewed and are negative.     Allergies  Review of patient's allergies indicates no known allergies.  Home Medications    Prior to Admission medications   Medication Sig Start Date End Date Taking? Authorizing Provider  albuterol (PROVENTIL) (2.5 MG/3ML) 0.083% nebulizer solution Take 3 mLs (2.5 mg total) by nebulization every 2 (two) hours as needed for wheezing or shortness of breath. 03/04/15  Yes Maryann Mikhail, DO  aspirin EC 81 MG EC tablet Take 1 tablet (81 mg total) by mouth daily. 03/04/15  Yes Maryann Mikhail, DO  atorvastatin (LIPITOR) 40 MG tablet Take 1 tablet (40 mg total) by mouth at bedtime. 03/04/15  Yes Maryann Mikhail, DO  budesonide (PULMICORT) 0.5 MG/2ML nebulizer solution Take 2 mLs (0.5 mg total) by nebulization every 6 (six) hours. 03/04/15  Yes Maryann Mikhail, DO  budesonide-formoterol (SYMBICORT) 160-4.5 MCG/ACT inhaler Inhale 2 puffs into the lungs 2 (two) times daily.   Yes Historical Provider, MD  guaiFENesin (MUCINEX) 600 MG 12 hr tablet Take 2 tablets (1,200 mg total) by mouth 2 (two) times daily. 03/04/15  Yes Maryann Mikhail, DO  Melatonin-Pyridoxine (MELATIN PO) Take 2 tablets by mouth at bedtime as needed (sleep).   Yes Historical Provider, MD  traZODone (DESYREL) 50 MG tablet Take 50 mg by mouth at bedtime.   Yes Historical Provider, MD  doxycycline (VIBRA-TABS) 100 MG tablet Take 1 tablet (100 mg total) by mouth every 12 (twelve) hours. Patient not taking: Reported on 03/13/2015 03/04/15   Cristal Ford, DO  feeding supplement, ENSURE ENLIVE, (ENSURE ENLIVE) LIQD Take 237 mLs by mouth 2 (two) times daily between meals. Patient not taking: Reported on 03/13/2015 03/04/15  Maryann Mikhail, DO  ipratropium-albuterol (DUONEB) 0.5-2.5 (3) MG/3ML SOLN Take 3 mLs by nebulization every 6 (six) hours. Patient not taking: Reported on 03/13/2015 03/04/15   Cristal Ford, DO  Multiple Vitamin (MULTIVITAMIN WITH MINERALS) TABS tablet Take 1 tablet by mouth daily. Patient not taking: Reported on 03/13/2015 03/04/15   Velta Addison Mikhail, DO  predniSONE (DELTASONE) 10 MG tablet Take 1 tablet (10 mg total) by  mouth daily with breakfast. Prednisone dosing: Take  Prednisone 40mg  (4 tabs) x 3 days, then taper to 30mg  (3 tabs) x 3 days, then 20mg  (2 tabs) x 3days, then 10mg  (1 tab) x 3days, then OFF. Patient not taking: Reported on 03/13/2015 03/04/15   Maryann Mikhail, DO   BP 133/84 mmHg  Pulse 80  Temp(Src) 98.4 F (36.9 C) (Oral)  Resp 22  SpO2 95% Physical Exam  Constitutional: He is oriented to person, place, and time. He appears well-developed and well-nourished.  Non-toxic appearance. No distress.  HENT:  Head: Normocephalic and atraumatic.  Eyes: Conjunctivae, EOM and lids are normal. Pupils are equal, round, and reactive to light.  Neck: Normal range of motion. Neck supple. No tracheal deviation present. No thyroid mass present.  Cardiovascular: Normal rate, regular rhythm and normal heart sounds.  Exam reveals no gallop.   No murmur heard. Pulmonary/Chest: Effort normal. No stridor. No respiratory distress. He has decreased breath sounds. He has wheezes. He has no rhonchi. He has no rales.  Abdominal: Soft. Normal appearance and bowel sounds are normal. He exhibits no distension. There is no tenderness. There is no rebound and no CVA tenderness.  Musculoskeletal: Normal range of motion. He exhibits no edema or tenderness.  Neurological: He is alert and oriented to person, place, and time. He has normal strength. No cranial nerve deficit or sensory deficit. GCS eye subscore is 4. GCS verbal subscore is 5. GCS motor subscore is 6.  Skin: Skin is warm and dry. No abrasion and no rash noted.  Psychiatric: He has a normal mood and affect. His speech is normal and behavior is normal.  Nursing note and vitals reviewed.   ED Course  Procedures (including critical care time) Labs Review Labs Reviewed  CBC - Abnormal; Notable for the following:    RBC 3.97 (*)    Hemoglobin 12.4 (*)    HCT 38.5 (*)    Platelets 130 (*)    All other components within normal limits  BASIC METABOLIC PANEL   BRAIN NATRIURETIC PEPTIDE  D-DIMER, QUANTITATIVE (NOT AT [facility])  I-STAT TROPOININ, ED    Imaging Review Dg Chest 2 View (if Patient Has Fever And/or Copd)  03/25/2015   CLINICAL DATA:  Shortness of breath, COPD and CHF.  EXAM: CHEST  2 VIEW  COMPARISON:  03/13/2015.  FINDINGS: Normal heart size. No pleural effusion or edema. No airspace consolidation identified. The lungs are hyperinflated and there are coarsened interstitial markings identified bilaterally compatible with emphysema.  IMPRESSION: 1. No acute findings.  No evidence for pneumonia. 2. Emphysema.   Electronically Signed   By: Kerby Moors M.D.   On: 03/25/2015 18:39     EKG Interpretation   Date/Time:  Monday March 25 2015 17:24:28 EDT Ventricular Rate:  79 PR Interval:  142 QRS Duration: 80 QT Interval:  366 QTC Calculation: 419 R Axis:   84 Text Interpretation:  Sinus rhythm Anteroseptal infarct, age indeterminate  ST elevation, consider inferior injury Baseline wander in lead(s) V4 V6  since last tracing no significant change Confirmed by [facility]  MD, Vira Agar  [phone]) on 03/25/2015 5:33:44 PM      MDM   Final diagnoses:  None    Patient given site Medrol here. Given albuterol and does feel slightly better. Chest CT negative for PE.  Patient is no longer dyspneic. He is stable for discharge  Lacretia Leigh, MD 03/25/15 2243

## 2015-04-01 ENCOUNTER — Encounter (HOSPITAL_COMMUNITY): Payer: Self-pay | Admitting: *Deleted

## 2015-04-01 ENCOUNTER — Emergency Department (HOSPITAL_COMMUNITY)
Admission: EM | Admit: 2015-04-01 | Discharge: 2015-04-02 | Disposition: A | Discharge status: Home / Self Care | Payer: Medicaid Other | Attending: Emergency Medicine | Admitting: Emergency Medicine

## 2015-04-01 ENCOUNTER — Emergency Department (HOSPITAL_COMMUNITY): Payer: Medicaid Other

## 2015-04-01 DIAGNOSIS — Z7951 Long term (current) use of inhaled steroids: Secondary | ICD-10-CM | POA: Diagnosis not present

## 2015-04-01 DIAGNOSIS — Z720 Tobacco use: Secondary | ICD-10-CM | POA: Diagnosis not present

## 2015-04-01 DIAGNOSIS — F419 Anxiety disorder, unspecified: Secondary | ICD-10-CM | POA: Insufficient documentation

## 2015-04-01 DIAGNOSIS — J449 Chronic obstructive pulmonary disease, unspecified: Secondary | ICD-10-CM | POA: Diagnosis not present

## 2015-04-01 DIAGNOSIS — Z79899 Other long term (current) drug therapy: Secondary | ICD-10-CM | POA: Diagnosis not present

## 2015-04-01 DIAGNOSIS — Z794 Long term (current) use of insulin: Secondary | ICD-10-CM | POA: Insufficient documentation

## 2015-04-01 DIAGNOSIS — Z9889 Other specified postprocedural states: Secondary | ICD-10-CM | POA: Insufficient documentation

## 2015-04-01 DIAGNOSIS — I509 Heart failure, unspecified: Secondary | ICD-10-CM | POA: Insufficient documentation

## 2015-04-01 DIAGNOSIS — Z7982 Long term (current) use of aspirin: Secondary | ICD-10-CM | POA: Insufficient documentation

## 2015-04-01 DIAGNOSIS — R079 Chest pain, unspecified: Secondary | ICD-10-CM

## 2015-04-01 LAB — CBC WITH DIFFERENTIAL/PLATELET
Basophils Absolute: 0 10*3/uL (ref 0.0–0.1)
Basophils Relative: 0 % (ref 0–1)
EOS PCT: 3 % (ref 0–5)
Eosinophils Absolute: 0.2 10*3/uL (ref 0.0–0.7)
HCT: 37 % — ABNORMAL LOW (ref 39.0–52.0)
Hemoglobin: 11.9 g/dL — ABNORMAL LOW (ref 13.0–17.0)
Lymphocytes Relative: 17 % (ref 12–46)
Lymphs Abs: 1.2 10*3/uL (ref 0.7–4.0)
MCH: 31.1 pg (ref 26.0–34.0)
MCHC: 32.2 g/dL (ref 30.0–36.0)
MCV: 96.6 fL (ref 78.0–100.0)
MONOS PCT: 9 % (ref 3–12)
Monocytes Absolute: 0.7 10*3/uL (ref 0.1–1.0)
Neutro Abs: 5 10*3/uL (ref 1.7–7.7)
Neutrophils Relative %: 71 % (ref 43–77)
PLATELETS: 186 10*3/uL (ref 150–400)
RBC: 3.83 MIL/uL — ABNORMAL LOW (ref 4.22–5.81)
RDW: 13.5 % (ref 11.5–15.5)
WBC: 7.1 10*3/uL (ref 4.0–10.5)

## 2015-04-01 LAB — I-STAT TROPONIN, ED: Troponin i, poc: 0 ng/mL (ref 0.00–0.08)

## 2015-04-01 LAB — TROPONIN I: Troponin I: <0.03 ng/mL (ref <0.031)

## 2015-04-01 MED ORDER — SODIUM CHLORIDE 0.9 % IV SOLN
Freq: Once | INTRAVENOUS | Status: AC
  Administered 2015-04-01: 22:00:00 via INTRAVENOUS

## 2015-04-01 MED ORDER — IPRATROPIUM-ALBUTEROL 0.5-2.5 (3) MG/3ML IN SOLN
3.0000 mL | Freq: Once | RESPIRATORY_TRACT | Status: AC
  Administered 2015-04-01: 3 mL via RESPIRATORY_TRACT
  Filled 2015-04-01: qty 3

## 2015-04-01 NOTE — ED Notes (Signed)
Pt to ED via [facility] c/o left sided chest pain onset today while sitting outside. Pain is intermittent, can be reproduced, and described as a sharp pain; denies shortness of breath. Pt gave 324mg  ASA and nitro x 1 with mild relief.

## 2015-04-01 NOTE — ED Notes (Signed)
Pt c/o L sided chest pain, pain worsening on inspiration and palpation. Pt describes pain as intermittent, sharp shooting pains to chest. Pt reports having a nebulizer machine which he last took a treatment today. Pt also reports being out of many medications due to financial reasons.

## 2015-04-01 NOTE — ED Provider Notes (Signed)
CSN: [id number]     Arrival date & time 04/01/15  2053 History   First MD Initiated Contact with Patient 04/01/15 2100     Chief Complaint  Patient presents with  . Chest Pain     (Consider location/radiation/quality/duration/timing/severity/associated sxs/prior Treatment) HPI Comments: This is 64 year old male who appears older than stated age, who presents via EMS with left sided chest pain that started sometime during the night last night, preventing him from sleeping more than 2-3 hours at a time.  The pain is persistent throughout the day.  About 2 hours ago.  He took a baby aspirin, EMS administered nitroglycerin with temporary minor relief of his discomfort, but not complete resolution.  Patient has a history of COPD he states he's been using his nebulizer on a regular basis but feels like he needs oxygen to help deliver the medication more forcefully. Patient states he sent for refills of his medications.  He has enough to last until the week  Patient is a 64 y.o. male presenting with chest pain. The history is provided by the patient.  Chest Pain Pain location:  L chest Pain quality: dull   Pain radiates to:  Does not radiate Pain radiates to the back: no   Pain severity:  Mild Onset quality:  Gradual Duration:  18 hours Timing:  Intermittent Progression:  Unchanged Chronicity:  Recurrent Context: at rest   Context: not breathing, not eating and no intercourse   Relieved by:  None tried Worsened by:  Nothing tried Ineffective treatments:  None tried Associated symptoms: no abdominal pain, no cough, no diaphoresis, no nausea, no numbness and no shortness of breath     Past Medical History  Diagnosis Date  . COPD (chronic obstructive pulmonary disease)   . HOH (hard of hearing)   . CHF (congestive heart failure) EF 20 - 25% in 2015   Past Surgical History  Procedure Laterality Date  . Appendectomy    . Left heart catheterization with coronary angiogram N/A 11/07/2013     Procedure: LEFT HEART CATHETERIZATION WITH CORONARY ANGIOGRAM;  Surgeon: Clent Demark, MD;  Location: [facility] CATH LAB;  Service: Cardiovascular;  Laterality: N/A;  . Cardiac stents      No stent history   History reviewed. No pertinent family history. History  Substance Use Topics  . Smoking status: Current Every Day Smoker    Types: Cigarettes  . Smokeless tobacco: Never Used  . Alcohol Use: No    Review of Systems  Constitutional: Negative for diaphoresis.  Respiratory: Negative for cough, shortness of breath and wheezing.   Cardiovascular: Positive for chest pain. Negative for leg swelling.  Gastrointestinal: Negative for nausea and abdominal pain.  Neurological: Negative for numbness.  All other systems reviewed and are negative.     Allergies  Review of patient's allergies indicates no known allergies.  Home Medications   Prior to Admission medications   Medication Sig Start Date End Date Taking? Authorizing Provider  albuterol (PROVENTIL) (2.5 MG/3ML) 0.083% nebulizer solution Take 3 mLs (2.5 mg total) by nebulization every 2 (two) hours as needed for wheezing or shortness of breath. 03/04/15  Yes Maryann Mikhail, DO  aspirin EC 81 MG EC tablet Take 1 tablet (81 mg total) by mouth daily. 03/04/15  Yes Maryann Mikhail, DO  atorvastatin (LIPITOR) 40 MG tablet Take 1 tablet (40 mg total) by mouth at bedtime. 03/04/15  Yes Maryann Mikhail, DO  budesonide (PULMICORT) 0.5 MG/2ML nebulizer solution Take 2 mLs (0.5 mg total) by  nebulization every 6 (six) hours. 03/04/15  Yes Maryann Mikhail, DO  budesonide-formoterol (SYMBICORT) 160-4.5 MCG/ACT inhaler Inhale 2 puffs into the lungs 2 (two) times daily.   Yes Historical Provider, MD  guaiFENesin (MUCINEX) 600 MG 12 hr tablet Take 2 tablets (1,200 mg total) by mouth 2 (two) times daily. 03/04/15  Yes Maryann Mikhail, DO  Melatonin-Pyridoxine (MELATIN PO) Take 2 tablets by mouth at bedtime as needed (sleep).   Yes Historical Provider, MD   traZODone (DESYREL) 50 MG tablet Take 50 mg by mouth at bedtime.   Yes Historical Provider, MD  doxycycline (VIBRA-TABS) 100 MG tablet Take 1 tablet (100 mg total) by mouth every 12 (twelve) hours. Patient not taking: Reported on 03/13/2015 03/04/15   Cristal Ford, DO  feeding supplement, ENSURE ENLIVE, (ENSURE ENLIVE) LIQD Take 237 mLs by mouth 2 (two) times daily between meals. Patient not taking: Reported on 03/13/2015 03/04/15   Velta Addison Mikhail, DO  ipratropium-albuterol (DUONEB) 0.5-2.5 (3) MG/3ML SOLN Take 3 mLs by nebulization every 6 (six) hours. Patient not taking: Reported on 03/13/2015 03/04/15   Cristal Ford, DO  Multiple Vitamin (MULTIVITAMIN WITH MINERALS) TABS tablet Take 1 tablet by mouth daily. Patient not taking: Reported on 03/13/2015 03/04/15   Velta Addison Mikhail, DO  predniSONE (DELTASONE) 10 MG tablet Take 1 tablet (10 mg total) by mouth daily with breakfast. Prednisone dosing: Take  Prednisone 40mg  (4 tabs) x 3 days, then taper to 30mg  (3 tabs) x 3 days, then 20mg  (2 tabs) x 3days, then 10mg  (1 tab) x 3days, then OFF. Patient not taking: Reported on 03/13/2015 03/04/15   Maryann Mikhail, DO   BP 137/93 mmHg  Pulse 85  Temp(Src) 98.2 F (36.8 C) (Oral)  Resp 18  SpO2 97% Physical Exam  Constitutional: He is oriented to person, place, and time. He appears well-developed and well-nourished.  Patient is extremely hard of hearing  HENT:  Head: Normocephalic.  Eyes: Pupils are equal, round, and reactive to light.  Neck: Normal range of motion.  Cardiovascular: Normal rate and regular rhythm.   Pulmonary/Chest: Effort normal and breath sounds normal.  Abdominal: Soft.  Musculoskeletal: Normal range of motion.  Neurological: He is alert and oriented to person, place, and time.  Skin: Skin is warm and dry.  Nursing note and vitals reviewed.   ED Course  Procedures (including critical care time) Labs Review Labs Reviewed  CBC WITH DIFFERENTIAL/PLATELET - Abnormal; Notable for  the following:    RBC 3.83 (*)    Hemoglobin 11.9 (*)    HCT 37.0 (*)    All other components within normal limits  TROPONIN I  I-STAT CHEM 8, ED  I-STAT TROPOININ, ED    Imaging Review Dg Chest 2 View  04/01/2015   CLINICAL DATA:  Left chest pain today.  EXAM: CHEST  2 VIEW  COMPARISON:  March 25, 2015  FINDINGS: The heart size and mediastinal contours are within normal limits. The lungs are hyperinflated. Bilateral nipple shadows are identified unchanged. There is no focal infiltrate, pulmonary edema, or pleural effusion. The visualized skeletal structures are stable.  IMPRESSION: No active cardiopulmonary disease.  Emphysema.   Electronically Signed   By: Abelardo Diesel M.D.   On: 04/01/2015 21:33     EKG Interpretation None     TIMI risk score 2 Patient's cardiac markers are 0.  He was given a sandwich and albuterol treatment and states he feels much better. MDM   Final diagnoses:  Chest pain  Chronic obstructive pulmonary disease, unspecified  COPD, unspecified chronic bronchitis type  Anxiety         Junius Creamer, NP 04/02/15 9842  Pattricia Boss, MD 04/02/15 1227

## 2015-04-01 NOTE — ED Notes (Signed)
Patient transported to X-ray 

## 2015-04-06 ENCOUNTER — Emergency Department (HOSPITAL_COMMUNITY): Payer: Medicaid Other

## 2015-04-06 ENCOUNTER — Encounter (HOSPITAL_COMMUNITY): Payer: Self-pay | Admitting: Emergency Medicine

## 2015-04-06 ENCOUNTER — Emergency Department (HOSPITAL_COMMUNITY)
Admission: EM | Admit: 2015-04-06 | Discharge: 2015-04-07 | Disposition: A | Discharge status: Home / Self Care | Payer: Medicaid Other | Attending: Emergency Medicine | Admitting: Emergency Medicine

## 2015-04-06 DIAGNOSIS — Z720 Tobacco use: Secondary | ICD-10-CM | POA: Diagnosis not present

## 2015-04-06 DIAGNOSIS — J441 Chronic obstructive pulmonary disease with (acute) exacerbation: Secondary | ICD-10-CM | POA: Insufficient documentation

## 2015-04-06 DIAGNOSIS — H9190 Unspecified hearing loss, unspecified ear: Secondary | ICD-10-CM | POA: Diagnosis not present

## 2015-04-06 DIAGNOSIS — Z7982 Long term (current) use of aspirin: Secondary | ICD-10-CM | POA: Insufficient documentation

## 2015-04-06 DIAGNOSIS — Z9889 Other specified postprocedural states: Secondary | ICD-10-CM | POA: Diagnosis not present

## 2015-04-06 DIAGNOSIS — R079 Chest pain, unspecified: Secondary | ICD-10-CM | POA: Insufficient documentation

## 2015-04-06 DIAGNOSIS — Z7951 Long term (current) use of inhaled steroids: Secondary | ICD-10-CM | POA: Insufficient documentation

## 2015-04-06 DIAGNOSIS — R0602 Shortness of breath: Secondary | ICD-10-CM | POA: Diagnosis present

## 2015-04-06 DIAGNOSIS — I509 Heart failure, unspecified: Secondary | ICD-10-CM | POA: Diagnosis not present

## 2015-04-06 DIAGNOSIS — Z7952 Long term (current) use of systemic steroids: Secondary | ICD-10-CM | POA: Insufficient documentation

## 2015-04-06 DIAGNOSIS — Z79899 Other long term (current) drug therapy: Secondary | ICD-10-CM | POA: Diagnosis not present

## 2015-04-06 DIAGNOSIS — Z9861 Coronary angioplasty status: Secondary | ICD-10-CM | POA: Insufficient documentation

## 2015-04-06 LAB — BASIC METABOLIC PANEL
Anion gap: 12 (ref 5–15)
BUN: 11 mg/dL (ref 6–20)
CALCIUM: 8.9 mg/dL (ref 8.9–10.3)
CO2: 22 mmol/L (ref 22–32)
Chloride: 105 mmol/L (ref 101–111)
Creatinine, Ser: 0.8 mg/dL (ref 0.61–1.24)
GFR calc Af Amer: >60 mL/min (ref >60)
GFR calc non Af Amer: >60 mL/min (ref >60)
GLUCOSE: 86 mg/dL (ref 65–99)
Potassium: 4.3 mmol/L (ref 3.5–5.1)
Sodium: 139 mmol/L (ref 135–145)

## 2015-04-06 LAB — CBC
HEMATOCRIT: 40.2 % (ref 39.0–52.0)
HEMOGLOBIN: 13.1 g/dL (ref 13.0–17.0)
MCH: 31.2 pg (ref 26.0–34.0)
MCHC: 32.6 g/dL (ref 30.0–36.0)
MCV: 95.7 fL (ref 78.0–100.0)
Platelets: 201 10*3/uL (ref 150–400)
RBC: 4.2 MIL/uL — AB (ref 4.22–5.81)
RDW: 13.5 % (ref 11.5–15.5)
WBC: 8 10*3/uL (ref 4.0–10.5)

## 2015-04-06 LAB — BRAIN NATRIURETIC PEPTIDE: B Natriuretic Peptide: 67.9 pg/mL (ref 0.0–100.0)

## 2015-04-06 LAB — TROPONIN I: Troponin I: <0.03 ng/mL (ref <0.031)

## 2015-04-06 MED ORDER — IPRATROPIUM-ALBUTEROL 0.5-2.5 (3) MG/3ML IN SOLN
3.0000 mL | Freq: Once | RESPIRATORY_TRACT | Status: AC
  Administered 2015-04-06: 3 mL via RESPIRATORY_TRACT
  Filled 2015-04-06: qty 3

## 2015-04-06 NOTE — ED Notes (Signed)
Pt c/o 6/10 mid chest pain and SOB, pt used his inhaler at home with no relief, 5mg  Albuterol given by [facility] and 125 mg Solumedrol IV.

## 2015-04-06 NOTE — ED Provider Notes (Signed)
CSN: [id number]     Arrival date & time 04/06/15  2007 History   First MD Initiated Contact with Patient 04/06/15 2009     Chief Complaint  Patient presents with  . Shortness of Breath  . Chest Pain     (Consider location/radiation/quality/duration/timing/severity/associated sxs/prior Treatment) HPI Comments: Patient is a 64 yo M PMHx significant for CHF, COPD, HOH presenting to the ED for evaluation of left sided chest pain that began around 1PM today. He also endorses worsening shortness of breath over the last week as he has been out of his home medications. He states these symptoms feel like previous COPD flare ups. No medications tried PTA. Endorses a little improvement after the 125mg  IV Solumedrol and Albuterol nebulizer given via EMS. Cath last year with essentially normal coronaries  Patient is a 64 y.o. male presenting with shortness of breath and chest pain.  Shortness of Breath Associated symptoms: chest pain and cough   Associated symptoms: no fever, no syncope and no vomiting   Chest Pain Pain location:  L chest Pain quality: dull   Pain radiates to:  Does not radiate Pain radiates to the back: no   Duration:  8 hours Timing:  Constant Progression:  Unchanged Chronicity:  Recurrent Context: no intercourse   Relieved by:  None tried Worsened by:  Nothing tried Ineffective treatments:  None tried Associated symptoms: cough and shortness of breath   Associated symptoms: no back pain, no fever, no palpitations, no syncope and not vomiting     Past Medical History  Diagnosis Date  . COPD (chronic obstructive pulmonary disease)   . HOH (hard of hearing)   . CHF (congestive heart failure) EF 20 - 25% in 2015   Past Surgical History  Procedure Laterality Date  . Appendectomy    . Left heart catheterization with coronary angiogram N/A 11/07/2013    Procedure: LEFT HEART CATHETERIZATION WITH CORONARY ANGIOGRAM;  Surgeon: Clent Demark, MD;  Location: [facility] CATH LAB;   Service: Cardiovascular;  Laterality: N/A;  . Cardiac stents      No stent history   No family history on file. History  Substance Use Topics  . Smoking status: Current Every Day Smoker    Types: Cigarettes  . Smokeless tobacco: Never Used  . Alcohol Use: No    Review of Systems  Constitutional: Negative for fever.  Respiratory: Positive for cough and shortness of breath.   Cardiovascular: Positive for chest pain. Negative for palpitations and syncope.  Gastrointestinal: Negative for vomiting.  Musculoskeletal: Negative for back pain.  All other systems reviewed and are negative.     Allergies  Review of patient's allergies indicates no known allergies.  Home Medications   Prior to Admission medications   Medication Sig Start Date End Date Taking? Authorizing Provider  albuterol (PROVENTIL) (2.5 MG/3ML) 0.083% nebulizer solution Take 3 mLs (2.5 mg total) by nebulization every 2 (two) hours as needed for wheezing or shortness of breath. 03/04/15  Yes Maryann Mikhail, DO  aspirin EC 81 MG EC tablet Take 1 tablet (81 mg total) by mouth daily. 03/04/15  Yes Maryann Mikhail, DO  atorvastatin (LIPITOR) 40 MG tablet Take 1 tablet (40 mg total) by mouth at bedtime. 03/04/15  Yes Maryann Mikhail, DO  budesonide-formoterol (SYMBICORT) 160-4.5 MCG/ACT inhaler Inhale 2 puffs into the lungs 2 (two) times daily.   Yes Historical Provider, MD  guaiFENesin (MUCINEX) 600 MG 12 hr tablet Take 2 tablets (1,200 mg total) by mouth 2 (two) times daily.  03/04/15  Yes Maryann Mikhail, DO  Melatonin-Pyridoxine (MELATIN PO) Take 2 tablets by mouth at bedtime as needed (sleep).   Yes Historical Provider, MD  traZODone (DESYREL) 50 MG tablet Take 50 mg by mouth at bedtime.   Yes Historical Provider, MD  budesonide (PULMICORT) 0.5 MG/2ML nebulizer solution Take 2 mLs (0.5 mg total) by nebulization every 6 (six) hours. Patient not taking: Reported on 04/06/2015 03/04/15   Velta Addison Mikhail, DO  doxycycline (VIBRA-TABS)  100 MG tablet Take 1 tablet (100 mg total) by mouth every 12 (twelve) hours. Patient not taking: Reported on 03/13/2015 03/04/15   Cristal Ford, DO  feeding supplement, ENSURE ENLIVE, (ENSURE ENLIVE) LIQD Take 237 mLs by mouth 2 (two) times daily between meals. Patient not taking: Reported on 03/13/2015 03/04/15   Velta Addison Mikhail, DO  ipratropium-albuterol (DUONEB) 0.5-2.5 (3) MG/3ML SOLN Take 3 mLs by nebulization every 6 (six) hours. Patient not taking: Reported on 03/13/2015 03/04/15   Cristal Ford, DO  Multiple Vitamin (MULTIVITAMIN WITH MINERALS) TABS tablet Take 1 tablet by mouth daily. Patient not taking: Reported on 03/13/2015 03/04/15   Velta Addison Mikhail, DO  predniSONE (DELTASONE) 10 MG tablet Take 1 tablet (10 mg total) by mouth daily with breakfast. Prednisone dosing: Take  Prednisone 40mg  (4 tabs) x 3 days, then taper to 30mg  (3 tabs) x 3 days, then 20mg  (2 tabs) x 3days, then 10mg  (1 tab) x 3days, then OFF. Patient not taking: Reported on 03/13/2015 03/04/15   Velta Addison Mikhail, DO  predniSONE (DELTASONE) 20 MG tablet Take 2 tablets (40 mg total) by mouth daily. 04/07/15   Dequane Strahan, PA-C   BP 124/85 mmHg  Pulse 88  Temp(Src) 98.3 F (36.8 C) (Oral)  Resp 19  Ht 5\' 9"  (1.753 m)  Wt 118 lb (53.524 kg)  BMI 17.42 kg/m2  SpO2 94% Physical Exam  Constitutional: He is oriented to person, place, and time. He appears well-developed and well-nourished.  Patient is extremely hard of hearing.   HENT:  Head: Normocephalic and atraumatic.  Eyes: Conjunctivae are normal.  Neck: Neck supple.  Cardiovascular: Normal rate, regular rhythm and normal heart sounds.   Pulmonary/Chest: Effort normal. No accessory muscle usage. No respiratory distress. He has decreased breath sounds in the right lower field and the left lower field.  Abdominal: Soft.  Musculoskeletal: He exhibits no edema.  Neurological: He is alert and oriented to person, place, and time.  Skin: Skin is warm and dry. He is not  diaphoretic.  Nursing note and vitals reviewed.   ED Course  Procedures (including critical care time) Medications  ipratropium-albuterol (DUONEB) 0.5-2.5 (3) MG/3ML nebulizer solution 3 mL (3 mLs Nebulization Given 04/06/15 2110)  ipratropium-albuterol (DUONEB) 0.5-2.5 (3) MG/3ML nebulizer solution 3 mL (3 mLs Nebulization Given 04/06/15 2226)  ipratropium-albuterol (DUONEB) 0.5-2.5 (3) MG/3ML nebulizer solution 3 mL (3 mLs Nebulization Given 04/06/15 2333)    Labs Review Labs Reviewed  CBC - Abnormal; Notable for the following:    RBC 4.20 (*)    All other components within normal limits  BASIC METABOLIC PANEL  BRAIN NATRIURETIC PEPTIDE  TROPONIN I    Imaging Review Dg Chest 2 View  04/06/2015   CLINICAL DATA:  Chest pain.  EXAM: CHEST  2 VIEW  COMPARISON:  April 01, 2015.  FINDINGS: The heart size and mediastinal contours are within normal limits. Both lungs are clear. No pneumothorax or pleural effusion is noted. Hyperexpansion of the lungs is noted consistent with chronic obstructive pulmonary disease. Old distal left clavicular fracture  is noted.  IMPRESSION: Hyperexpansion of the lungs consistent with chronic obstructive pulmonary disease. No acute cardiopulmonary abnormality seen.   Electronically Signed   By: Marijo Conception, M.D.   On: 04/06/2015 21:00     EKG Interpretation   Date/Time:  Saturday April 06 2015 20:18:52 EDT Ventricular Rate:  86 PR Interval:  138 QRS Duration: 83 QT Interval:  345 QTC Calculation: 413 R Axis:   73 Text Interpretation:  Sinus rhythm Anteroseptal infarct, age indeterminate  No significant change since last tracing Confirmed by POLLINA  MD,  CHRISTOPHER [phone]) on 04/06/2015 8:50:23 PM      HEART Score 2  10:43 PM Patient states his chest tightness is resolved, feels his breathing is better. Breath sounds improved, still slightly diminished at right lower base will order one more duoneb.   12:05 AM Lungs clear to auscultation. Patient states  he is breathing back at baseline, chest tightness is still 0/10.   MDM   Final diagnoses:  COPD exacerbation    Filed Vitals:   04/07/15 0013  BP:   Pulse:   Temp: 98.3 F (36.8 C)  Resp:     I have reviewed nursing notes, vital signs, and all appropriate lab and imaging results if ordered as above.   Patient presenting to the ED for recurrent SOB and left sided chest pain. Patient with 16 visits in the last 6 months with similar presentation. Symptoms improved per patient after initial Albuterol nebulizer and IV Solumedrol given via EMS.   Upon arrival to the ED, patient with no signs of respiratory distress, no hypoxia or oxygen requirement. Diminished breaths ounds   At this time the patient presents with typical COPD type exacerbation which responded very well to DuoNeb. Symptoms were resolved and he was speaking comfortably with no signs of dyspnea. The patient had been given Solu-Medrol by EMS. The patient reported his chest pain is resolved and stated he was back to normal. His mental status is clear. He has eaten without difficulty. Will discharge home with Prednisone burst and advised PCP f/u. Patient is stable at time of discharge   Patient d/w with Dr. Betsey Holiday, agrees with plan.    Baron Sane, PA-C 04/07/15 0019  Orpah Greek, MD 04/07/15 Quentin Mulling

## 2015-04-07 MED ORDER — PREDNISONE 20 MG PO TABS: 40.0000 mg | ORAL_TABLET | Freq: Every day | ORAL | Status: DC

## 2015-04-07 NOTE — Discharge Instructions (Signed)
Please follow up with your primary care physician in 1-2 days. If you do not have one please call the [facility] number listed above. Please read all discharge instructions and return precautions.  ° °Chronic Obstructive Pulmonary Disease °Chronic obstructive pulmonary disease (COPD) is a common lung condition in which airflow from the lungs is limited. COPD is a general term that can be used to describe many different lung problems that limit airflow, including both chronic bronchitis and emphysema.  If you have COPD, your lung function will probably never return to normal, but there are measures you can take to improve lung function and make yourself feel better.  °CAUSES  °· Smoking (common).   °· Exposure to secondhand smoke.   °· Genetic problems. °· Chronic inflammatory lung diseases or recurrent infections. °SYMPTOMS  °· Shortness of breath, especially with physical activity.   °· Deep, persistent (chronic) cough with a large amount of thick mucus.   °· Wheezing.   °· Rapid breaths (tachypnea).   °· Gray or bluish discoloration (cyanosis) of the skin, especially in fingers, toes, or lips.   °· Fatigue.   °· Weight loss.   °· Frequent infections or episodes when breathing symptoms become much worse (exacerbations).   °· Chest tightness. °DIAGNOSIS  °Your health care provider will take a medical history and perform a physical examination to make the initial diagnosis.  Additional tests for COPD may include:  °· Lung (pulmonary) function tests. °· Chest X-ray. °· CT scan. °· Blood tests. °TREATMENT  °Treatment available to help you feel better when you have COPD includes:  °· Inhaler and nebulizer medicines. These help manage the symptoms of COPD and make your breathing more comfortable. °· Supplemental oxygen. Supplemental oxygen is only helpful if you have a low oxygen level in your blood.   °· Exercise and physical activity. These are beneficial for nearly all people with COPD. Some  people may also benefit from a pulmonary rehabilitation program. °HOME CARE INSTRUCTIONS  °· Take all medicines (inhaled or pills) as directed by your health care provider. °· Avoid over-the-counter medicines or cough syrups that dry up your airway (such as antihistamines) and slow down the elimination of secretions unless instructed otherwise by your health care provider.   °· If you are a smoker, the most important thing that you can do is stop smoking. Continuing to smoke will cause further lung damage and breathing trouble. Ask your health care provider for help with quitting smoking. He or she can direct you to community resources or hospitals that provide support. °· Avoid exposure to irritants such as smoke, chemicals, and fumes that aggravate your breathing. °· Use oxygen therapy and pulmonary rehabilitation if directed by your health care provider. If you require home oxygen therapy, ask your health care provider whether you should purchase a pulse oximeter to measure your oxygen level at home.   °· Avoid contact with individuals who have a contagious illness. °· Avoid extreme temperature and humidity changes. °· Eat healthy foods. Eating smaller, more frequent meals and resting before meals may help you maintain your strength. °· Stay active, but balance activity with periods of rest. Exercise and physical activity will help you maintain your ability to do things you want to do. °· Preventing infection and hospitalization is very important when you have COPD. Make sure to receive all the vaccines your health care provider recommends, especially the pneumococcal and influenza vaccines. Ask your health care provider whether you need a pneumonia vaccine. °· Learn and use relaxation techniques to manage stress. °· Learn and use   controlled breathing techniques as directed by your health care provider. Controlled breathing techniques include:   °¨ Pursed lip breathing. Start by breathing in (inhaling) through  your nose for 1 second. Then, purse your lips as if you were going to whistle and breathe out (exhale) through the pursed lips for 2 seconds.   °¨ Diaphragmatic breathing. Start by putting one hand on your abdomen just above your waist. Inhale slowly through your nose. The hand on your abdomen should move out. Then purse your lips and exhale slowly. You should be able to feel the hand on your abdomen moving in as you exhale.   °· Learn and use controlled coughing to clear mucus from your lungs. Controlled coughing is a series of short, progressive coughs. The steps of controlled coughing are:   °1. Lean your head slightly forward.   °2. Breathe in deeply using diaphragmatic breathing.   °3. Try to hold your breath for 3 seconds.   °4. Keep your mouth slightly open while coughing twice.   °5. Spit any mucus out into a tissue.   °6. Rest and repeat the steps once or twice as needed. °SEEK MEDICAL CARE IF:  °· You are coughing up more mucus than usual.   °· There is a change in the color or thickness of your mucus.   °· Your breathing is more labored than usual.   °· Your breathing is faster than usual.   °SEEK IMMEDIATE MEDICAL CARE IF:  °· You have shortness of breath while you are resting.   °· You have shortness of breath that prevents you from: °¨ Being able to talk.   °¨ Performing your usual physical activities.   °· You have chest pain lasting longer than 5 minutes.   °· Your skin color is more cyanotic than usual. °· You measure low oxygen saturations for longer than 5 minutes with a pulse oximeter. °MAKE SURE YOU:  °· Understand these instructions. °· Will watch your condition. °· Will get help right away if you are not doing well or get worse. °Document Released: 06/24/2005 Document Revised: 01/29/2014 Document Reviewed: 05/11/2013 °[employer]® Patient Information ©2015 [employer]. This information is not intended to replace advice given to you by your health care provider. Make sure you discuss any  questions you have with your health care provider. ° °

## 2015-04-26 ENCOUNTER — Encounter (HOSPITAL_COMMUNITY): Payer: Self-pay | Admitting: Internal Medicine

## 2015-04-26 ENCOUNTER — Inpatient Hospital Stay (HOSPITAL_COMMUNITY)
Admission: EM | Admit: 2015-04-26 | Discharge: 2015-04-30 | DRG: 388 | Disposition: A | Discharge status: Home / Self Care | Payer: Medicaid Other | Attending: Internal Medicine | Admitting: Internal Medicine

## 2015-04-26 ENCOUNTER — Emergency Department (HOSPITAL_COMMUNITY): Payer: Medicaid Other

## 2015-04-26 ENCOUNTER — Other Ambulatory Visit: Payer: Self-pay

## 2015-04-26 DIAGNOSIS — J449 Chronic obstructive pulmonary disease, unspecified: Secondary | ICD-10-CM | POA: Diagnosis present

## 2015-04-26 DIAGNOSIS — Z823 Family history of stroke: Secondary | ICD-10-CM

## 2015-04-26 DIAGNOSIS — Z9049 Acquired absence of other specified parts of digestive tract: Secondary | ICD-10-CM | POA: Diagnosis present

## 2015-04-26 DIAGNOSIS — Z955 Presence of coronary angioplasty implant and graft: Secondary | ICD-10-CM | POA: Diagnosis not present

## 2015-04-26 DIAGNOSIS — Z7952 Long term (current) use of systemic steroids: Secondary | ICD-10-CM | POA: Diagnosis not present

## 2015-04-26 DIAGNOSIS — Z79899 Other long term (current) drug therapy: Secondary | ICD-10-CM | POA: Diagnosis not present

## 2015-04-26 DIAGNOSIS — Z681 Body mass index (BMI) 19 or less, adult: Secondary | ICD-10-CM | POA: Diagnosis not present

## 2015-04-26 DIAGNOSIS — J181 Lobar pneumonia, unspecified organism: Secondary | ICD-10-CM | POA: Diagnosis not present

## 2015-04-26 DIAGNOSIS — K5669 Other intestinal obstruction: Secondary | ICD-10-CM | POA: Diagnosis not present

## 2015-04-26 DIAGNOSIS — I5022 Chronic systolic (congestive) heart failure: Secondary | ICD-10-CM | POA: Diagnosis present

## 2015-04-26 DIAGNOSIS — R112 Nausea with vomiting, unspecified: Secondary | ICD-10-CM | POA: Diagnosis present

## 2015-04-26 DIAGNOSIS — R0602 Shortness of breath: Secondary | ICD-10-CM | POA: Diagnosis present

## 2015-04-26 DIAGNOSIS — K5660 Unspecified intestinal obstruction: Principal | ICD-10-CM | POA: Diagnosis present

## 2015-04-26 DIAGNOSIS — Z720 Tobacco use: Secondary | ICD-10-CM | POA: Diagnosis present

## 2015-04-26 DIAGNOSIS — J42 Unspecified chronic bronchitis: Secondary | ICD-10-CM

## 2015-04-26 DIAGNOSIS — E279 Disorder of adrenal gland, unspecified: Secondary | ICD-10-CM | POA: Diagnosis present

## 2015-04-26 DIAGNOSIS — K56609 Unspecified intestinal obstruction, unspecified as to partial versus complete obstruction: Secondary | ICD-10-CM | POA: Diagnosis present

## 2015-04-26 DIAGNOSIS — J189 Pneumonia, unspecified organism: Secondary | ICD-10-CM | POA: Diagnosis not present

## 2015-04-26 DIAGNOSIS — L8991 Pressure ulcer of unspecified site, stage 1: Secondary | ICD-10-CM | POA: Diagnosis present

## 2015-04-26 DIAGNOSIS — E785 Hyperlipidemia, unspecified: Secondary | ICD-10-CM | POA: Diagnosis present

## 2015-04-26 DIAGNOSIS — L8990 Pressure ulcer of unspecified site, unspecified stage: Secondary | ICD-10-CM | POA: Diagnosis not present

## 2015-04-26 DIAGNOSIS — E43 Unspecified severe protein-calorie malnutrition: Secondary | ICD-10-CM | POA: Diagnosis not present

## 2015-04-26 DIAGNOSIS — H9190 Unspecified hearing loss, unspecified ear: Secondary | ICD-10-CM | POA: Diagnosis present

## 2015-04-26 DIAGNOSIS — F17210 Nicotine dependence, cigarettes, uncomplicated: Secondary | ICD-10-CM | POA: Diagnosis present

## 2015-04-26 DIAGNOSIS — Z7982 Long term (current) use of aspirin: Secondary | ICD-10-CM | POA: Diagnosis not present

## 2015-04-26 HISTORY — DX: Hyperlipidemia, unspecified: E78.5

## 2015-04-26 LAB — LIPASE, BLOOD: LIPASE: 26 U/L (ref 22–51)

## 2015-04-26 LAB — COMPREHENSIVE METABOLIC PANEL
ALBUMIN: 4.1 g/dL (ref 3.5–5.0)
ALK PHOS: 55 U/L (ref 38–126)
ALT: 24 U/L (ref 17–63)
ANION GAP: 12 (ref 5–15)
AST: 25 U/L (ref 15–41)
BUN: 44 mg/dL — AB (ref 6–20)
CO2: 34 mmol/L — ABNORMAL HIGH (ref 22–32)
CREATININE: 0.97 mg/dL (ref 0.61–1.24)
Calcium: 9.4 mg/dL (ref 8.9–10.3)
Chloride: 89 mmol/L — ABNORMAL LOW (ref 101–111)
GFR calc Af Amer: >60 mL/min (ref >60)
GFR calc non Af Amer: >60 mL/min (ref >60)
Glucose, Bld: 119 mg/dL — ABNORMAL HIGH (ref 65–99)
Potassium: 4.1 mmol/L (ref 3.5–5.1)
SODIUM: 135 mmol/L (ref 135–145)
TOTAL PROTEIN: 7.4 g/dL (ref 6.5–8.1)
Total Bilirubin: 1.6 mg/dL — ABNORMAL HIGH (ref 0.3–1.2)

## 2015-04-26 LAB — CBC WITH DIFFERENTIAL/PLATELET
BASOS ABS: 0 10*3/uL (ref 0.0–0.1)
Basophils Relative: 0 % (ref 0–1)
EOS ABS: 0.1 10*3/uL (ref 0.0–0.7)
Eosinophils Relative: 1 % (ref 0–5)
HCT: 47.9 % (ref 39.0–52.0)
HEMOGLOBIN: 16 g/dL (ref 13.0–17.0)
LYMPHS PCT: 8 % — AB (ref 12–46)
Lymphs Abs: 0.9 10*3/uL (ref 0.7–4.0)
MCH: 31.1 pg (ref 26.0–34.0)
MCHC: 33.4 g/dL (ref 30.0–36.0)
MCV: 93.2 fL (ref 78.0–100.0)
MONOS PCT: 16 % — AB (ref 3–12)
Monocytes Absolute: 1.9 10*3/uL — ABNORMAL HIGH (ref 0.1–1.0)
Neutro Abs: 8.7 10*3/uL — ABNORMAL HIGH (ref 1.7–7.7)
Neutrophils Relative %: 75 % (ref 43–77)
PLATELETS: 143 10*3/uL — AB (ref 150–400)
RBC: 5.14 MIL/uL (ref 4.22–5.81)
RDW: 12.9 % (ref 11.5–15.5)
WBC: 11.6 10*3/uL — ABNORMAL HIGH (ref 4.0–10.5)

## 2015-04-26 MED ORDER — GUAIFENESIN ER 600 MG PO TB12
1200.0000 mg | ORAL_TABLET | Freq: Two times a day (BID) | ORAL | Status: DC
  Administered 2015-04-27 – 2015-04-30 (×7): 1200 mg via ORAL
  Filled 2015-04-26 (×8): qty 2

## 2015-04-26 MED ORDER — BUDESONIDE-FORMOTEROL FUMARATE 160-4.5 MCG/ACT IN AERO
2.0000 | INHALATION_SPRAY | Freq: Two times a day (BID) | RESPIRATORY_TRACT | Status: DC
  Administered 2015-04-27 – 2015-04-30 (×7): 2 via RESPIRATORY_TRACT
  Filled 2015-04-26: qty 6

## 2015-04-26 MED ORDER — MELATONIN 5 MG PO TABS: 10.0000 mg | ORAL_TABLET | Freq: Every evening | ORAL | Status: DC | PRN

## 2015-04-26 MED ORDER — ALBUTEROL SULFATE (2.5 MG/3ML) 0.083% IN NEBU: 2.5000 mg | INHALATION_SOLUTION | RESPIRATORY_TRACT | Status: DC | PRN

## 2015-04-26 MED ORDER — SODIUM CHLORIDE 0.9 % IV BOLUS (SEPSIS)
500.0000 mL | Freq: Once | INTRAVENOUS | Status: AC
  Administered 2015-04-26: 500 mL via INTRAVENOUS

## 2015-04-26 MED ORDER — ONDANSETRON HCL 4 MG/2ML IJ SOLN
4.0000 mg | Freq: Once | INTRAMUSCULAR | Status: AC
  Administered 2015-04-26: 4 mg via INTRAVENOUS
  Filled 2015-04-26: qty 2

## 2015-04-26 MED ORDER — ATORVASTATIN CALCIUM 40 MG PO TABS
40.0000 mg | ORAL_TABLET | Freq: Every day | ORAL | Status: DC
  Administered 2015-04-27 – 2015-04-29 (×3): 40 mg via ORAL
  Filled 2015-04-26 (×5): qty 1

## 2015-04-26 MED ORDER — ASPIRIN EC 81 MG PO TBEC
81.0000 mg | DELAYED_RELEASE_TABLET | Freq: Every day | ORAL | Status: DC
  Administered 2015-04-27 – 2015-04-30 (×4): 81 mg via ORAL
  Filled 2015-04-26 (×4): qty 1

## 2015-04-26 MED ORDER — ONDANSETRON HCL 4 MG/2ML IJ SOLN: 4.0000 mg | Freq: Three times a day (TID) | INTRAMUSCULAR | Status: DC | PRN

## 2015-04-26 MED ORDER — METHYLPREDNISOLONE SODIUM SUCC 125 MG IJ SOLR
60.0000 mg | Freq: Two times a day (BID) | INTRAMUSCULAR | Status: DC
  Administered 2015-04-27 (×2): 60 mg via INTRAVENOUS
  Filled 2015-04-26 (×3): qty 0.96

## 2015-04-26 MED ORDER — PIPERACILLIN-TAZOBACTAM 3.375 G IVPB
3.3750 g | Freq: Three times a day (TID) | INTRAVENOUS | Status: DC
  Administered 2015-04-27 – 2015-04-29 (×8): 3.375 g via INTRAVENOUS
  Filled 2015-04-26 (×9): qty 50

## 2015-04-26 MED ORDER — SODIUM CHLORIDE 0.9 % IJ SOLN
3.0000 mL | Freq: Two times a day (BID) | INTRAMUSCULAR | Status: DC
  Administered 2015-04-27 – 2015-04-29 (×3): 3 mL via INTRAVENOUS

## 2015-04-26 MED ORDER — ACETAMINOPHEN 650 MG RE SUPP: 650.0000 mg | Freq: Four times a day (QID) | RECTAL | Status: DC | PRN

## 2015-04-26 MED ORDER — IPRATROPIUM-ALBUTEROL 0.5-2.5 (3) MG/3ML IN SOLN
3.0000 mL | Freq: Four times a day (QID) | RESPIRATORY_TRACT | Status: DC
  Administered 2015-04-27 – 2015-04-28 (×5): 3 mL via RESPIRATORY_TRACT
  Filled 2015-04-26 (×5): qty 3

## 2015-04-26 MED ORDER — SODIUM CHLORIDE 0.9 % IV SOLN
INTRAVENOUS | Status: AC
  Administered 2015-04-27: via INTRAVENOUS

## 2015-04-26 MED ORDER — TRAZODONE HCL 50 MG PO TABS
50.0000 mg | ORAL_TABLET | Freq: Every day | ORAL | Status: DC
  Administered 2015-04-27 – 2015-04-29 (×3): 50 mg via ORAL
  Filled 2015-04-26 (×5): qty 1

## 2015-04-26 MED ORDER — ACETAMINOPHEN 325 MG PO TABS: 650.0000 mg | ORAL_TABLET | Freq: Four times a day (QID) | ORAL | Status: DC | PRN

## 2015-04-26 MED ORDER — PREDNISONE 20 MG PO TABS
20.0000 mg | ORAL_TABLET | Freq: Three times a day (TID) | ORAL | Status: DC
  Administered 2015-04-27: 20 mg via ORAL
  Filled 2015-04-26 (×4): qty 1

## 2015-04-26 MED ORDER — NICOTINE 21 MG/24HR TD PT24
21.0000 mg | MEDICATED_PATCH | Freq: Every day | TRANSDERMAL | Status: DC
  Administered 2015-04-27 – 2015-04-30 (×4): 21 mg via TRANSDERMAL
  Filled 2015-04-26 (×4): qty 1

## 2015-04-26 MED ORDER — ENSURE ENLIVE PO LIQD
237.0000 mL | Freq: Two times a day (BID) | ORAL | Status: DC
  Administered 2015-04-29 – 2015-04-30 (×3): 237 mL via ORAL

## 2015-04-26 MED ORDER — MORPHINE SULFATE 2 MG/ML IJ SOLN: 2.0000 mg | INTRAMUSCULAR | Status: DC | PRN

## 2015-04-26 MED ORDER — TIOTROPIUM BROMIDE MONOHYDRATE 18 MCG IN CAPS
18.0000 ug | ORAL_CAPSULE | Freq: Every day | RESPIRATORY_TRACT | Status: DC
  Administered 2015-04-27 – 2015-04-30 (×4): 18 ug via RESPIRATORY_TRACT
  Filled 2015-04-26: qty 5

## 2015-04-26 MED ORDER — IPRATROPIUM-ALBUTEROL 0.5-2.5 (3) MG/3ML IN SOLN
3.0000 mL | Freq: Once | RESPIRATORY_TRACT | Status: AC
  Administered 2015-04-26: 3 mL via RESPIRATORY_TRACT
  Filled 2015-04-26: qty 3

## 2015-04-26 MED ORDER — LIDOCAINE VISCOUS 2 % MT SOLN
OROMUCOSAL | Status: AC
  Filled 2015-04-26: qty 15

## 2015-04-26 NOTE — ED Notes (Signed)
Pt vomiting. RN aware. 

## 2015-04-26 NOTE — ED Notes (Signed)
Pt given urinal and made aware urine sample is needed.  

## 2015-04-26 NOTE — ED Notes (Signed)
Per EMS: Pt from home.  C/o lt abd pain x 9 days.  Has not been eating.  Pt appears cachectic.  NV.

## 2015-04-26 NOTE — ED Provider Notes (Signed)
CSN: [id number]     Arrival date & time 04/26/15  1821 History   First MD Initiated Contact with Patient 04/26/15 1825     Chief Complaint  Patient presents with  . Abdominal Pain     (Consider location/radiation/quality/duration/timing/severity/associated sxs/prior Treatment) HPI Comments: Patient presents with abdominal pain. He is very hard of hearing so the history is difficult to obtain from him. He seems to have been having some abdominal pain for the last several days. He states he feels like he ate something bad they gave him food poisoning. He had a hamburger yesterday and some spinach. He's been vomiting throughout the day. He is vomiting green stuff which he thinks is a spinach. He is having bowel movements without diarrhea. He denies any fevers. He is short of breath and has a history of COPD. He feels like he needs a breathing treatment.  Patient is a 64 y.o. male presenting with abdominal pain.  Abdominal Pain Associated symptoms: cough, fatigue, nausea, shortness of breath and vomiting   Associated symptoms: no chest pain, no chills, no diarrhea, no fever and no hematuria     Past Medical History  Diagnosis Date  . COPD (chronic obstructive pulmonary disease)   . HOH (hard of hearing)   . CHF (congestive heart failure) EF 20 - 25% in 2015  . HLD (hyperlipidemia)    Past Surgical History  Procedure Laterality Date  . Appendectomy    . Left heart catheterization with coronary angiogram N/A 11/07/2013    Procedure: LEFT HEART CATHETERIZATION WITH CORONARY ANGIOGRAM;  Surgeon: Clent Demark, MD;  Location: [facility] C[facility] LAB;  Service: Cardiovascular;  Laterality: N/A;  . Cardiac stents      No stent history   No family history on file. History  Substance Use Topics  . Smoking status: Current Every Day Smoker    Types: Cigarettes  . Smokeless tobacco: Never Used  . Alcohol Use: No    Review of Systems  Constitutional: Positive for fatigue. Negative for fever, chills  and diaphoresis.  HENT: Negative for congestion, rhinorrhea and sneezing.   Eyes: Negative.   Respiratory: Positive for cough and shortness of breath. Negative for chest tightness.   Cardiovascular: Negative for chest pain and leg swelling.  Gastrointestinal: Positive for nausea, vomiting and abdominal pain. Negative for diarrhea and blood in stool.  Genitourinary: Negative for frequency, hematuria, flank pain and difficulty urinating.  Musculoskeletal: Negative for back pain and arthralgias.  Skin: Negative for rash.  Neurological: Negative for dizziness, speech difficulty, weakness, numbness and headaches.      Allergies  Review of patient's allergies indicates no known allergies.  Home Medications   Prior to Admission medications   Medication Sig Start Date End Date Taking? Authorizing Provider  atorvastatin (LIPITOR) 40 MG tablet Take 1 tablet (40 mg total) by mouth at bedtime. 03/04/15  Yes Maryann Mikhail, DO  [employer] SLEEP AID 5 MG TABS TAKE 2 PILLS BY MOUTH 30 MIN BEFORE BED TIME. 04/18/15  Yes Historical Provider, MD  ipratropium-albuterol (DUONEB) 0.5-2.5 (3) MG/3ML SOLN Take 3 mLs by nebulization every 6 (six) hours. 03/04/15  Yes Maryann Mikhail, DO  PROAIR HFA 108 (90 BASE) MCG/ACT inhaler INHALE 2 PUFFS EVERY 4 HOURS AS NEEDED FOR WHEEZING OR SHORTNESS OF BREATH 04/18/15  Yes Historical Provider, MD  PULMICORT 0.25 MG/2ML nebulizer solution Take 0.25 mg by nebulization 4 (four) times daily.  04/18/15  Yes Historical Provider, MD  SPIRIVA HANDIHALER 18 MCG inhalation capsule INHALE CONTENTS OF 1  CAPSULE USING HANDIHALER EVERY DAY FOR BREATHING 04/18/15  Yes Historical Provider, MD  traZODone (DESYREL) 50 MG tablet Take 50 mg by mouth at bedtime.   Yes Historical Provider, MD  albuterol (PROVENTIL) (2.5 MG/3ML) 0.083% nebulizer solution Take 3 mLs (2.5 mg total) by nebulization every 2 (two) hours as needed for wheezing or shortness of breath. 03/04/15   Maryann Mikhail, DO  aspirin EC 81  MG EC tablet Take 1 tablet (81 mg total) by mouth daily. 03/04/15   Maryann Mikhail, DO  budesonide (PULMICORT) 0.5 MG/2ML nebulizer solution Take 2 mLs (0.5 mg total) by nebulization every 6 (six) hours. Patient not taking: Reported on 04/06/2015 03/04/15   Velta Addison Mikhail, DO  budesonide-formoterol (SYMBICORT) 160-4.5 MCG/ACT inhaler Inhale 2 puffs into the lungs 2 (two) times daily.    Historical Provider, MD  doxycycline (VIBRA-TABS) 100 MG tablet Take 1 tablet (100 mg total) by mouth every 12 (twelve) hours. Patient not taking: Reported on 03/13/2015 03/04/15   Cristal Ford, DO  feeding supplement, ENSURE ENLIVE, (ENSURE ENLIVE) LIQD Take 237 mLs by mouth 2 (two) times daily between meals. Patient not taking: Reported on 03/13/2015 03/04/15   Velta Addison Mikhail, DO  guaiFENesin (MUCINEX) 600 MG 12 hr tablet Take 2 tablets (1,200 mg total) by mouth 2 (two) times daily. 03/04/15   Maryann Mikhail, DO  Multiple Vitamin (MULTIVITAMIN WITH MINERALS) TABS tablet Take 1 tablet by mouth daily. Patient not taking: Reported on 03/13/2015 03/04/15   Velta Addison Mikhail, DO  predniSONE (DELTASONE) 10 MG tablet Take 1 tablet (10 mg total) by mouth daily with breakfast. Prednisone dosing: Take  Prednisone 40mg  (4 tabs) x 3 days, then taper to 30mg  (3 tabs) x 3 days, then 20mg  (2 tabs) x 3days, then 10mg  (1 tab) x 3days, then OFF. Patient not taking: Reported on 03/13/2015 03/04/15   Velta Addison Mikhail, DO  predniSONE (DELTASONE) 20 MG tablet Take 2 tablets (40 mg total) by mouth daily. Patient taking differently: Take 20 mg by mouth 3 (three) times daily.  04/07/15   Jennifer Piepenbrink, PA-C   BP 131/86 mmHg  Pulse 94  Temp(Src) 98.2 F (36.8 C) (Oral)  Resp 24  SpO2 98% Physical Exam  Constitutional: He is oriented to person, place, and time. He appears well-developed and well-nourished.  HENT:  Head: Normocephalic and atraumatic.  Eyes: Pupils are equal, round, and reactive to light.  Neck: Normal range of motion. Neck  supple.  Cardiovascular: Normal rate, regular rhythm and normal heart sounds.   Pulmonary/Chest: Effort normal and breath sounds normal. No respiratory distress. He has no wheezes. He has no rales. He exhibits no tenderness.  Abdominal: Soft. Bowel sounds are normal. There is tenderness (Moderate diffuse tenderness). There is no rebound and no guarding.  Musculoskeletal: Normal range of motion. He exhibits no edema.  Lymphadenopathy:    He has no cervical adenopathy.  Neurological: He is alert and oriented to person, place, and time.  Skin: Skin is warm and dry. No rash noted.  Psychiatric: He has a normal mood and affect.    ED Course  Procedures (including critical care time) Labs Review Labs Reviewed  CBC WITH DIFFERENTIAL/PLATELET - Abnormal; Notable for the following:    WBC 11.6 (*)    Platelets 143 (*)    Neutro Abs 8.7 (*)    Lymphocytes Relative 8 (*)    Monocytes Relative 16 (*)    Monocytes Absolute 1.9 (*)    All other components within normal limits  COMPREHENSIVE METABOLIC PANEL -  Abnormal; Notable for the following:    Chloride 89 (*)    CO2 34 (*)    Glucose, Bld 119 (*)    BUN 44 (*)    Total Bilirubin 1.6 (*)    All other components within normal limits  LIPASE, BLOOD  URINALYSIS, ROUTINE W REFLEX MICROSCOPIC (NOT AT [facility])    Imaging Review Ct Abdomen Pelvis Wo Contrast  04/26/2015   CLINICAL DATA:  Left abdominal pain for 9 days.  Nausea, vomiting.  EXAM: CT ABDOMEN AND PELVIS WITHOUT CONTRAST  TECHNIQUE: Multidetector CT imaging of the abdomen and pelvis was performed following the standard protocol without IV contrast.  COMPARISON:  None.  FINDINGS: Lung bases are clear.  No effusions.  Heart is normal size.  Stomach and the proximal to mid small bowel loops are markedly dilated compatible with high grade small bowel obstruction. Exact transition is not identified. Mid to distal small bowel loops and colon are decompressed. Moderate stool burden throughout  the colon. No free fluid, free air or adenopathy.  Liver, gallbladder, spleen, pancreas, right adrenal and kidneys are unremarkable. Heterogeneous mass within the left adrenal gland measures 4.2 cm, not significantly changed since prior MRI when this was felt to most likely reflect partially necrotic at adenoma.  Aortic and iliac calcifications. No aneurysm. No acute bony abnormality or focal bone lesion.  IMPRESSION: Markedly dilated stomach and proximal to mid small bowel loops compatible with high-grade partial small bowel obstruction. Exact transition point or cause not identified.  Moderate stool burden throughout the colon.  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   Electronically Signed   By: Rolm Baptise M.D.   On: 04/26/2015 20:45   Dg Chest 2 View  04/26/2015   CLINICAL DATA:  Left-sided abdominal pain 9 days with nausea and vomiting.  EXAM: CHEST  2 VIEW  COMPARISON:  04/06/2015  FINDINGS: Lungs are adequately inflated with mild patchy opacification in the medial right base as this may be due to atelectasis or early infection. No evidence of effusion. Cardiomediastinal silhouette and remainder of the exam is unchanged.  IMPRESSION: Mild patchy opacification in the medial right base which may be due to atelectasis or early infection.   Electronically Signed   By: Marin Olp M.D.   On: 04/26/2015 20:35     EKG Interpretation   Date/Time:  Friday April 26 2015 22:03:06 EDT Ventricular Rate:  94 PR Interval:  140 QRS Duration: 92 QT Interval:  376 QTC Calculation: 470 R Axis:   92 Text Interpretation:  Sinus rhythm Atrial premature complex Right axis  deviation since last tracing no significant change Confirmed by Clarita Mcelvain  MD,  Jaeli Grubb ([id number]) on 04/26/2015 10:27:52 PM      MDM   Final diagnoses:  SBO (small bowel obstruction)    Patient with small bowel obstruction. We will go ahead and place an NG tube. He was given IV fluids. I spoke with Dr. Lucia Gaskins who  requested the hospitalist admit the patient. He will consult on the patient.  Dr. Blaine Hamper with hospitalist service will admit pt.  Malvin Johns, MD 04/26/15 2228

## 2015-04-26 NOTE — Consult Note (Addendum)
Re:   Anthony Bolton DOB:   10-20-1950 MRN:   [id number]  [facility] Consultation  ASSESSMENT AND PLAN: 1.  SBO  Probably as a result of his prior appendectomy.  Plan: NGT, repeat 2 view KUB, labs, and exam in AM  Will follow.  2.  COPD  3.  CHF  20-25% EF in 2015  Followed by Dr. Terrence Dupont - had cath in 10/2013  4.  Hard of hearing - very difficult to communicate with 5.  On chronic prednisone 6.  Probable poor nutrition  Will check prealbumin with AM labs.  Chief Complaint  Patient presents with  . Abdominal Pain   REFERRING PHYSICIAN: [name], MD  HISTORY OF PRESENT ILLNESS: Anthony Bolton is a 64 y.o. (DOB: 09/05/1951)  white  male whose primary care physician is [name], MD. He came to the [facility] today with increasing abdominal distention and vomiting. He is in the [place] room by himself.  Of note, he has had 16 hospital visits over this year for exacerbation of his COPD and chest pain.  He was hospitalized from 6/3 - 03/04/2015 for COPD exacerbation.  He is very difficult to talk to and get a history from because of his very poor hearing.  He often misinterprets questions. He had an appendectomy aruond age 64.  There appear to be 2 lower abdominal incisions.  He has some abdominal weakness around these incisions. I cannot get much other history from him regarding his abdomen or GI.  CT scan of abdomen - 04/26/2015 - 1) Markedly dilated stomach and proximal to mid small bowel loops compatible with high-grade partial small bowel obstruction. Exact transition point or cause not identified.   2) Moderate stool burden throughout the colon. 3)Stable left adrenal mass shown by prior MRI to reflect adenoma.    Past Medical History  Diagnosis Date  . COPD (chronic obstructive pulmonary disease)   . HOH (hard of hearing)   . CHF (congestive heart failure) EF 20 - 25% in 2015      Past Surgical History  Procedure Laterality Date  . Appendectomy    . Left heart  catheterization with coronary angiogram N/A 11/07/2013    Procedure: LEFT HEART CATHETERIZATION WITH CORONARY ANGIOGRAM;  Surgeon: Clent Demark, MD;  Location: [facility] CATH LAB;  Service: Cardiovascular;  Laterality: N/A;  . Cardiac stents      No stent history      Current Facility-Administered Medications  Medication Dose Route Frequency Provider Last Rate Last Dose  . lidocaine (XYLOCAINE) 2 % viscous mouth solution            Current Outpatient Prescriptions  Medication Sig Dispense Refill  . atorvastatin (LIPITOR) 40 MG tablet Take 1 tablet (40 mg total) by mouth at bedtime. 30 tablet 0  . CVS SLEEP AID 5 MG TABS TAKE 2 PILLS BY MOUTH 30 MIN BEFORE BED TIME.  4  . ipratropium-albuterol (DUONEB) 0.5-2.5 (3) MG/3ML SOLN Take 3 mLs by nebulization every 6 (six) hours. 360 mL 2  . PROAIR HFA 108 (90 BASE) MCG/ACT inhaler INHALE 2 PUFFS EVERY 4 HOURS AS NEEDED FOR WHEEZING OR SHORTNESS OF BREATH  4  . PULMICORT 0.25 MG/2ML nebulizer solution Take 0.25 mg by nebulization 4 (four) times daily.   4  . SPIRIVA HANDIHALER 18 MCG inhalation capsule INHALE CONTENTS OF 1 CAPSULE USING HANDIHALER EVERY DAY FOR BREATHING  4  . traZODone (DESYREL) 50 MG tablet Take 50 mg by mouth at bedtime.    Marland Kitchen  albuterol (PROVENTIL) (2.5 MG/3ML) 0.083% nebulizer solution Take 3 mLs (2.5 mg total) by nebulization every 2 (two) hours as needed for wheezing or shortness of breath. 75 mL 2  . aspirin EC 81 MG EC tablet Take 1 tablet (81 mg total) by mouth daily. 30 tablet 0  . budesonide (PULMICORT) 0.5 MG/2ML nebulizer solution Take 2 mLs (0.5 mg total) by nebulization every 6 (six) hours. (Patient not taking: Reported on 04/06/2015) 360 mL 2  . budesonide-formoterol (SYMBICORT) 160-4.5 MCG/ACT inhaler Inhale 2 puffs into the lungs 2 (two) times daily.    Marland Kitchen doxycycline (VIBRA-TABS) 100 MG tablet Take 1 tablet (100 mg total) by mouth every 12 (twelve) hours. (Patient not taking: Reported on 03/13/2015) 6 tablet 0  . feeding  supplement, ENSURE ENLIVE, (ENSURE ENLIVE) LIQD Take 237 mLs by mouth 2 (two) times daily between meals. (Patient not taking: Reported on 03/13/2015) 237 mL 12  . guaiFENesin (MUCINEX) 600 MG 12 hr tablet Take 2 tablets (1,200 mg total) by mouth 2 (two) times daily. 30 tablet 0  . Multiple Vitamin (MULTIVITAMIN WITH MINERALS) TABS tablet Take 1 tablet by mouth daily. (Patient not taking: Reported on 03/13/2015) 30 tablet 0  . predniSONE (DELTASONE) 10 MG tablet Take 1 tablet (10 mg total) by mouth daily with breakfast. Prednisone dosing: Take  Prednisone 40mg  (4 tabs) x 3 days, then taper to 30mg  (3 tabs) x 3 days, then 20mg  (2 tabs) x 3days, then 10mg  (1 tab) x 3days, then OFF. (Patient not taking: Reported on 03/13/2015) 30 tablet 0  . predniSONE (DELTASONE) 20 MG tablet Take 2 tablets (40 mg total) by mouth daily. (Patient taking differently: Take 20 mg by mouth 3 (three) times daily. ) 10 tablet 0  . [DISCONTINUED] ramipril (ALTACE) 1.25 MG capsule Take 1 capsule (1.25 mg total) by mouth daily. (Patient not taking: Reported on 10/10/2014) 30 capsule 3     No Known Allergies  REVIEW OF SYSTEMS: Skin:  No history of rash.  No history of abnormal moles. Infection:  No history of hepatitis or HIV.  No history of MRSA. Neurologic:  No history of stroke.  No history of seizure.  No history of headaches. Cardiac:   CHF,   20-25% EF in 10/2013 on echo, Followed by Dr. Terrence Dupont - had cath in 10/2013 Pulmonary:  COPD.  On chronic prednisone.  Endocrine:  No diabetes. No thyroid disease. Gastrointestinal:  See HPI. Urologic:  No history of kidney stones.  No history of bladder infections. Musculoskeletal:  No history of joint or back disease. Hematologic:  No bleeding disorder.  No history of anemia.  Not anticoagulated. Psycho-social:  The patient is oriented.    SOCIAL and FAMILY HISTORY: Unmarried.   Has children but has not seen them in 20 years.  There is a Scientist, research (medical) [id number]) listed in  demographics.  I tried to call the number, but no one answered. Unsure where he lives. Appears to have no support person.  PHYSICAL EXAM: BP 131/86 mmHg  Pulse 94  Temp(Src) 98.2 F (36.8 C) (Oral)  Resp 24  SpO2 98%  General: Thin WM who does not look healthy.  He breathes with somewhat pursed lips. HEENT: Normal. Pupils equal. Neck: Supple. No mass.  No thyroid mass.  Lymph Nodes:  No supraclavicular or cervical nodes. Lungs: Clear to auscultation and symmetric breath sounds. Heart:  RRR. No murmur or rub. Abdomen: Soft. BS present.  Lower abdominal and right paramedian scar from appendectomy around age 23. Rectal: Not  done. Extremities:  Thin. Neurologic:  Grossly intact to motor and sensory function. Psychiatric: He is alert, but he is so hard of hearing, it is difficult to assess his mental status.  DATA REVIEWED: Epic notes  Alphonsa Overall, MD,  [facility] Surgery, [address]  [place], [place]    [place] Phone:  [phone] FAX:  [phone]

## 2015-04-26 NOTE — H&P (Signed)
[facility] History and Physical  Anthony Bolton OYD:[id number] DOB: 01/26/1951 DOA: 04/26/2015  Referring physician: ED physician PCP: [name], MD  Specialists:   Chief Complaint: Nausea, vomiting, abdominal pain  HPI: Anthony Bolton is a 64 y.o. male with PMH of hyperlipidemia COPD, severe hearing loss, systolic congestive heart failure (EF 20-25%), hyperlipidemia, who presents with nausea, vomiting and abdominal pain.  He reports that in the past 7 days, he has been having nausea, vomiting and abdominal pain. His abdominal pain is diffuse pain, constant, nonradiating. It is not aggravated or alleviated by any known factors. He has vomited more than 10 times each day. He does not have diarrhea. No fever or chills. Patient has mild cough and shortness of breath due to COPD, which has not changed from her baseline. Patient does not have chest pain, rashes, unilateral weakness.  In ED, patient was found to have WBC 7.6, temperature normal, no tachycardia, electrolytes okay, normal AST/ALT, total bilirubin 1.6. Chest x-ray showed mild patchy opacification in the medial right bass. 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  Where does patient live?   At home   Can patient participate in ADLs? Some   Review of Systems:   General: no fevers, chills, no changes in body weight, has poor appetite, has fatigue HEENT: no blurry vision, hearing changes or sore throat Pulm: has dyspnea, coughing, no wheezing CV: no chest pain, palpitations Abd: has nausea, vomiting, abdominal pain, no diarrhea, constipation GU: no dysuria, burning on urination, increased urinary frequency, hematuria  Ext: no leg edema Neuro: no unilateral weakness, numbness, or tingling, no vision change or hearing loss Skin: no rash MSK: No muscle spasm, no deformity,  no limitation of range of movement in spin Heme: No easy bruising.  Travel history: No recent long distant travel.  Allergy: No Known Allergies  Past Medical History  Diagnosis Date  . COPD (chronic obstructive pulmonary disease)   . HOH (hard of hearing)   . CHF (congestive heart failure) EF 20 - 25% in 2015  . HLD (hyperlipidemia)     Past Surgical History  Procedure Laterality Date  . Appendectomy    . Left heart catheterization with coronary angiogram N/A 11/07/2013    Procedure: LEFT HEART CATHETERIZATION WITH CORONARY ANGIOGRAM;  Surgeon: Clent Demark, MD;  Location: [facility] CATH LAB;  Service: Cardiovascular;  Laterality: N/A;  . Cardiac stents      No stent history    Social History:  reports that he has been smoking Cigarettes.  He has never used smokeless tobacco. He reports that he does not drink alcohol or use illicit drugs.  Family History:  Family History  Problem Relation Age of Onset  . Stroke Mother      Prior to Admission medications   Medication Sig Start Date End Date Taking? Authorizing Provider  atorvastatin (LIPITOR) 40 MG tablet Take 1 tablet (40 mg total) by mouth at bedtime. 03/04/15  Yes Maryann Mikhail, DO  [employer] SLEEP AID 5 MG TABS TAKE 2 PILLS BY MOUTH 30 MIN BEFORE BED TIME. 04/18/15  Yes Historical Provider, MD  ipratropium-albuterol (DUONEB) 0.5-2.5 (3) MG/3ML SOLN Take 3 mLs by nebulization every 6 (six) hours. 03/04/15  Yes Maryann Mikhail, DO  PROAIR HFA 108 (90 BASE) MCG/ACT inhaler INHALE 2 PUFFS EVERY 4 HOURS AS NEEDED FOR WHEEZING OR SHORTNESS OF BREATH 04/18/15  Yes  Historical Provider, MD  PULMICORT 0.25 MG/2ML nebulizer solution Take 0.25 mg by nebulization 4 (four) times daily.  04/18/15  Yes Historical Provider, MD  SPIRIVA HANDIHALER 18 MCG inhalation capsule INHALE CONTENTS OF 1 CAPSULE USING HANDIHALER EVERY DAY FOR BREATHING 04/18/15  Yes Historical Provider, MD  traZODone (DESYREL) 50 MG tablet Take 50 mg by mouth at bedtime.   Yes  Historical Provider, MD  albuterol (PROVENTIL) (2.5 MG/3ML) 0.083% nebulizer solution Take 3 mLs (2.5 mg total) by nebulization every 2 (two) hours as needed for wheezing or shortness of breath. 03/04/15   Maryann Mikhail, DO  aspirin EC 81 MG EC tablet Take 1 tablet (81 mg total) by mouth daily. 03/04/15   Maryann Mikhail, DO  budesonide (PULMICORT) 0.5 MG/2ML nebulizer solution Take 2 mLs (0.5 mg total) by nebulization every 6 (six) hours. Patient not taking: Reported on 04/06/2015 03/04/15   Velta Addison Mikhail, DO  budesonide-formoterol (SYMBICORT) 160-4.5 MCG/ACT inhaler Inhale 2 puffs into the lungs 2 (two) times daily.    Historical Provider, MD  doxycycline (VIBRA-TABS) 100 MG tablet Take 1 tablet (100 mg total) by mouth every 12 (twelve) hours. Patient not taking: Reported on 03/13/2015 03/04/15   Cristal Ford, DO  feeding supplement, ENSURE ENLIVE, (ENSURE ENLIVE) LIQD Take 237 mLs by mouth 2 (two) times daily between meals. Patient not taking: Reported on 03/13/2015 03/04/15   Velta Addison Mikhail, DO  guaiFENesin (MUCINEX) 600 MG 12 hr tablet Take 2 tablets (1,200 mg total) by mouth 2 (two) times daily. 03/04/15   Maryann Mikhail, DO  Multiple Vitamin (MULTIVITAMIN WITH MINERALS) TABS tablet Take 1 tablet by mouth daily. Patient not taking: Reported on 03/13/2015 03/04/15   Velta Addison Mikhail, DO  predniSONE (DELTASONE) 10 MG tablet Take 1 tablet (10 mg total) by mouth daily with breakfast. Prednisone dosing: Take  Prednisone 40mg  (4 tabs) x 3 days, then taper to 30mg  (3 tabs) x 3 days, then 20mg  (2 tabs) x 3days, then 10mg  (1 tab) x 3days, then OFF. Patient not taking: Reported on 03/13/2015 03/04/15   Velta Addison Mikhail, DO  predniSONE (DELTASONE) 20 MG tablet Take 2 tablets (40 mg total) by mouth daily. Patient taking differently: Take 20 mg by mouth 3 (three) times daily.  04/07/15   Baron Sane, PA-C    Physical Exam: Filed Vitals:   04/26/15 1910 04/26/15 2050 04/26/15 2230 04/26/15 2256  BP:  131/86   123/88  Pulse:  94 90 83  Temp:    98.3 F (36.8 C)  TempSrc:    Oral  Resp:  24 15 12   Weight:    48.5 kg (106 lb 14.8 oz)  SpO2: 93% 98% 94% 100%   General: Not in acute distress. Cachectic. HEENT:       Eyes: PERRL, EOMI, no scleral icterus.       ENT: No discharge from the ears and nose, no pharynx injection, no tonsillar enlargement.        Neck: No JVD, no bruit, no mass felt. Heme: No neck lymph node enlargement. Cardiac: S1/S2, RRR, No murmurs, No gallops or rubs. Pulm: decreased air movement bilaterally. No rales, wheezing, rhonchi or rubs. Abd: nondistended, diffused tenderness, no rebound pain, no organomegaly, BS present. Ext: No pitting leg edema bilaterally. 2+DP/PT pulse bilaterally. Musculoskeletal: No joint deformities, No joint redness or warmth, no limitation of ROM in spin. Skin: No rashes.  Neuro: Alert, oriented X3, cranial nerves II-XII grossly intact, muscle strength 5/5 in all extremities, sensation to light touch intact.  Psych: Patient  is not psychotic, no suicidal or hemocidal ideation.  Labs on Admission:  Basic Metabolic Panel:  Recent Labs Lab 04/26/15 1900  NA 135  K 4.1  CL 89*  CO2 34*  GLUCOSE 119*  BUN 44*  CREATININE 0.97  CALCIUM 9.4   Liver Function Tests:  Recent Labs Lab 04/26/15 1900  AST 25  ALT 24  ALKPHOS 55  BILITOT 1.6*  PROT 7.4  ALBUMIN 4.1    Recent Labs Lab 04/26/15 1900  LIPASE 26   No results for input(s): AMMONIA in the last 168 hours. CBC:  Recent Labs Lab 04/26/15 1900  WBC 11.6*  NEUTROABS 8.7*  HGB 16.0  HCT 47.9  MCV 93.2  PLT 143*   Cardiac Enzymes: No results for input(s): CKTOTAL, CKMB, CKMBINDEX, TROPONINI in the last 168 hours.  BNP (last 3 results)  Recent Labs  03/13/15 2039 03/25/15 1839 04/06/15 2101  BNP 22.7 145.2* 67.9    ProBNP (last 3 results) No results for input(s): PROBNP in the last 8760 hours.  CBG: No results for input(s): GLUCAP in the last 168  hours.  Radiological Exams on Admission: Ct Abdomen Pelvis Wo Contrast  04/26/2015   CLINICAL DATA:  Left abdominal pain for 9 days.  Nausea, vomiting.  EXAM: CT ABDOMEN AND PELVIS WITHOUT CONTRAST  TECHNIQUE: Multidetector CT imaging of the abdomen and pelvis was performed following the standard protocol without IV contrast.  COMPARISON:  None.  FINDINGS: Lung bases are clear.  No effusions.  Heart is normal size.  Stomach and the proximal to mid small bowel loops are markedly dilated compatible with high grade small bowel obstruction. Exact transition is not identified. Mid to distal small bowel loops and colon are decompressed. Moderate stool burden throughout the colon. No free fluid, free air or adenopathy.  Liver, gallbladder, spleen, pancreas, right adrenal and kidneys are unremarkable. Heterogeneous mass within the left adrenal gland measures 4.2 cm, not significantly changed since prior MRI when this was felt to most likely reflect partially necrotic at adenoma.  Aortic and iliac calcifications. No aneurysm. No acute bony abnormality or focal bone lesion.  IMPRESSION: Markedly dilated stomach and proximal to mid small bowel loops compatible with high-grade partial small bowel obstruction. Exact transition point or cause not identified.  Moderate stool burden throughout the colon.  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   Electronically Signed   By: Rolm Baptise M.D.   On: 04/26/2015 20:45   Dg Chest 2 View  04/26/2015   CLINICAL DATA:  Left-sided abdominal pain 9 days with nausea and vomiting.  EXAM: CHEST  2 VIEW  COMPARISON:  04/06/2015  FINDINGS: Lungs are adequately inflated with mild patchy opacification in the medial right base as this may be due to atelectasis or early infection. No evidence of effusion. Cardiomediastinal silhouette and remainder of the exam is unchanged.  IMPRESSION: Mild patchy opacification in the medial right base which may be due to atelectasis or  early infection.   Electronically Signed   By: Marin Olp M.D.   On: 04/26/2015 20:35    EKG:   Not done in ED, will get one.   Assessment/Plan Principal Problem:   SBO (small bowel obstruction) Active Problems:   COPD (chronic obstructive pulmonary disease)   Tobacco abuse   Protein-calorie malnutrition, severe   Systolic CHF   HLD (hyperlipidemia)   SOB (shortness of breath)  SBO: Patient's abdominal pain is most likely caused by small bowel obstruction as  evidenced by CT abdomen/pelvis. General surgery was consulted. Dr. Lucia Gaskins saw patient. Patient is not septic on admission. Hemodynamically stable.  - will admit to tele bed - Appreciate Dr. Pollie Friar consultation, wiil follow-up recommendations of follows: Plan: NGT, repeat 2 view KUB, labs - started IV Zosyn - follow up blood culture - INR/PTT/type & screen - will get Procalcitonin and trend lactic acid levels - IVF: 500 cc of NS bolus in ED, followed by 50 cc/h (patient has EF 20-25%, limiting aggressive IV fluids treatment).  COPD: Stable. Patient has decreased movement bilaterally, but no signs of acute exacerbation. Patient is on prednisone chronically. -Breathing treatment: Albuterol nebulizer prn, Symbicort inhaler, DuoNeb nebulizer, Spiriva -Continue home prednisone -Give stress dose Solu-Medrol, 60 mg twice a day -check cortisol level  Systolic CHF: 2D echo on 04/04/28 showed 20-25% EF.Followed by Dr. Terrence Dupont - had cath in 10/2013. Patient is not on diuretics. No leg edema. CHF is compensated. -check BNP -watch volume status carefully  Tobacco abuse and Alcohol abuse: -Did counseling about importance of quitting smoking -Nicotine patch  Protein-calorie malnutrition, severe - Nutrition supplement when able to eat  HLD: Last LDL was 137 on 03/02/15.  -Continue home medications: Lipitor  DVT ppx:  SCD Code Status: Full code Family Communication: None at bed side.    Disposition Plan: Admit to  inpatient   Date of Service 04/27/2015    Ivor Costa [facility] Pager [phone]  If 7PM-7AM, please contact night-coverage [email or web address] Password [email or web address] 04/27/2015, 2:11 AM

## 2015-04-26 NOTE — Progress Notes (Signed)
PHARMACIST - PHYSICIAN ORDER COMMUNICATION  CONCERNING: P&T Medication Policy on Herbal Medications  DESCRIPTION:  This patient's order for:  Melatonin  has been noted.  This product(s) is classified as an "herbal" or natural product. Due to a lack of definitive safety studies or FDA approval, nonstandard manufacturing practices, plus the potential risk of unknown drug-drug interactions while on inpatient medications, the Pharmacy and Therapeutics Committee does not permit the use of "herbal" or natural products of this type within [facility].   ACTION TAKEN: The pharmacy department is unable to verify this order at this time and your patient has been informed of this safety policy. Please reevaluate patient's clinical condition at discharge and address if the herbal or natural product(s) should be resumed at that time.   Dorrene German 04/26/2015 11:29 PM

## 2015-04-27 ENCOUNTER — Encounter (HOSPITAL_COMMUNITY): Payer: Self-pay | Admitting: Internal Medicine

## 2015-04-27 ENCOUNTER — Inpatient Hospital Stay (HOSPITAL_COMMUNITY): Payer: Medicaid Other

## 2015-04-27 DIAGNOSIS — E785 Hyperlipidemia, unspecified: Secondary | ICD-10-CM

## 2015-04-27 DIAGNOSIS — L8990 Pressure ulcer of unspecified site, unspecified stage: Secondary | ICD-10-CM | POA: Insufficient documentation

## 2015-04-27 LAB — URINALYSIS, ROUTINE W REFLEX MICROSCOPIC
Bilirubin Urine: NEGATIVE
Glucose, UA: NEGATIVE mg/dL
HGB URINE DIPSTICK: NEGATIVE
KETONES UR: NEGATIVE mg/dL
Leukocytes, UA: NEGATIVE
Nitrite: NEGATIVE
PROTEIN: NEGATIVE mg/dL
SPECIFIC GRAVITY, URINE: 1.025 (ref 1.005–1.030)
UROBILINOGEN UA: 0.2 mg/dL (ref 0.0–1.0)
pH: 5 (ref 5.0–8.0)

## 2015-04-27 LAB — COMPREHENSIVE METABOLIC PANEL
ALBUMIN: 3.8 g/dL (ref 3.5–5.0)
ALT: 22 U/L (ref 17–63)
AST: 20 U/L (ref 15–41)
Alkaline Phosphatase: 48 U/L (ref 38–126)
Anion gap: 11 (ref 5–15)
BUN: 41 mg/dL — ABNORMAL HIGH (ref 6–20)
CO2: 30 mmol/L (ref 22–32)
Calcium: 8.8 mg/dL — ABNORMAL LOW (ref 8.9–10.3)
Chloride: 94 mmol/L — ABNORMAL LOW (ref 101–111)
Creatinine, Ser: 0.77 mg/dL (ref 0.61–1.24)
GFR calc Af Amer: >60 mL/min (ref >60)
GFR calc non Af Amer: >60 mL/min (ref >60)
Glucose, Bld: 119 mg/dL — ABNORMAL HIGH (ref 65–99)
POTASSIUM: 4 mmol/L (ref 3.5–5.1)
SODIUM: 135 mmol/L (ref 135–145)
TOTAL PROTEIN: 6.7 g/dL (ref 6.5–8.1)
Total Bilirubin: 1.3 mg/dL — ABNORMAL HIGH (ref 0.3–1.2)

## 2015-04-27 LAB — ABO/RH: ABO/RH(D): A POS

## 2015-04-27 LAB — GLUCOSE, CAPILLARY: Glucose-Capillary: 144 mg/dL — ABNORMAL HIGH (ref 65–99)

## 2015-04-27 LAB — TYPE AND SCREEN
ABO/RH(D): A POS
Antibody Screen: NEGATIVE

## 2015-04-27 LAB — APTT: aPTT: 25 seconds (ref 24–37)

## 2015-04-27 LAB — CBC
HEMATOCRIT: 44.4 % (ref 39.0–52.0)
HEMOGLOBIN: 15.5 g/dL (ref 13.0–17.0)
MCH: 32.3 pg (ref 26.0–34.0)
MCHC: 34.9 g/dL (ref 30.0–36.0)
MCV: 92.5 fL (ref 78.0–100.0)
Platelets: 138 10*3/uL — ABNORMAL LOW (ref 150–400)
RBC: 4.8 MIL/uL (ref 4.22–5.81)
RDW: 13 % (ref 11.5–15.5)
WBC: 12 10*3/uL — ABNORMAL HIGH (ref 4.0–10.5)

## 2015-04-27 LAB — BRAIN NATRIURETIC PEPTIDE: B Natriuretic Peptide: 28.1 pg/mL (ref 0.0–100.0)

## 2015-04-27 LAB — PROTIME-INR
INR: 1.05 (ref 0.00–1.49)
PROTHROMBIN TIME: 13.9 s (ref 11.6–15.2)

## 2015-04-27 LAB — LACTIC ACID, PLASMA
LACTIC ACID, VENOUS: 1.7 mmol/L (ref 0.5–2.0)
Lactic Acid, Venous: 1.7 mmol/L (ref 0.5–2.0)

## 2015-04-27 LAB — PROCALCITONIN: Procalcitonin: <0.1 ng/mL

## 2015-04-27 LAB — CORTISOL-AM, BLOOD: Cortisol - AM: 21.6 ug/dL (ref 6.7–22.6)

## 2015-04-27 LAB — PREALBUMIN: Prealbumin: 17.4 mg/dL — ABNORMAL LOW (ref 18–38)

## 2015-04-27 MED ORDER — CETYLPYRIDINIUM CHLORIDE 0.05 % MT LIQD
7.0000 mL | Freq: Two times a day (BID) | OROMUCOSAL | Status: DC
  Administered 2015-04-27 – 2015-04-30 (×3): 7 mL via OROMUCOSAL

## 2015-04-27 MED ORDER — METHYLPREDNISOLONE SODIUM SUCC 40 MG IJ SOLR
20.0000 mg | INTRAMUSCULAR | Status: DC
  Administered 2015-04-28 – 2015-04-29 (×2): 20 mg via INTRAVENOUS
  Filled 2015-04-27 (×3): qty 0.5

## 2015-04-27 MED ORDER — CHLORHEXIDINE GLUCONATE 0.12 % MT SOLN
15.0000 mL | Freq: Two times a day (BID) | OROMUCOSAL | Status: DC
  Administered 2015-04-27 – 2015-04-30 (×6): 15 mL via OROMUCOSAL
  Filled 2015-04-27 (×9): qty 15

## 2015-04-27 NOTE — Evaluation (Signed)
Physical Therapy Evaluation Patient Details Name: Anthony Bolton MRN: [id number] DOB: Dec 04, 1950 Today's Date: 04/27/2015   History of Present Illness  Anthony Bolton is a 64 y.o. male adm with nausea, vomiting and abdominal pain;  PMHx: hyperlipidemia, COPD, severe hearing loss, systolic congestive heart failure (EF 20-25%), hyperlipidemia  .  Clinical Impression  Pt admitted with above diagnosis. Pt currently with functional limitations due to the deficits listed below (see PT Problem List).  Pt will benefit from skilled PT to increase their independence and safety with mobility to allow discharge to the venue listed below.  Will follow for needs; It appears pt was having difficulty with mobility, unable to amb very far d/t baseline co-morbidities;     Follow Up Recommendations Home health PT;SNF (depending on progress;)    Equipment Recommendations  Other (comment) (TBD)    Recommendations for Other Services       Precautions / Restrictions Precautions Precautions: Fall Precaution Comments: NGT; EF 20-25%      Mobility  Bed Mobility Overal bed mobility: Needs Assistance Bed Mobility: Supine to Sit     Supine to sit: Supervision        Transfers Overall transfer level: Needs assistance Equipment used: Rolling walker (2 wheeled) Transfers: Sit to/from [facility] Sit to Stand: Min guard Stand pivot transfers: Min assist       General transfer comment: cues for safety; pt requires min assist to control descent (sits very quickly without warning)  Ambulation/Gait             General Gait Details: NT d/t DOE with transfer  Stairs            Wheelchair Mobility    Modified Rankin (Stroke Patients Only)       Balance                                             Pertinent Vitals/Pain Pain Assessment: No/denies pain    Home Living Family/patient expects to be discharged to:: Private residence Living  Arrangements: Alone   Type of Home: House Home Access: Stairs to enter Entrance Stairs-Rails: Left Entrance Stairs-Number of Steps: 7 Home Layout: One level Home Equipment: Cane - single point (homemade cane per pt) Additional Comments: takes cab or bus to store monthly for grocery shopping; states cab driver helps him get groceries inside ( he can't carry or do much at once d/t cardiopulmonary compromise)    Prior Function Level of Independence: Independent;Independent with assistive device(s)         Comments: very limited activity per pt     Hand Dominance        Extremity/Trunk Assessment   Upper Extremity Assessment: Defer to OT evaluation           Lower Extremity Assessment: Generalized weakness (diffuse atrophy)         Communication   Communication: HOH  Cognition Arousal/Alertness: Awake/alert Behavior During Therapy: WFL for tasks assessed/performed Overall Cognitive Status: Within Functional Limits for tasks assessed                      General Comments      Exercises        Assessment/Plan    PT Assessment Patient needs continued PT services  PT Diagnosis Difficulty walking   PT Problem List Decreased activity tolerance;Decreased mobility;Cardiopulmonary status  limiting activity;Decreased safety awareness  PT Treatment Interventions DME instruction;Gait training;Functional mobility training;Therapeutic activities;Therapeutic exercise   PT Goals (Current goals can be found in the Care Plan section) Acute Rehab PT Goals Patient Stated Goal: wants to eat and drink PT Goal Formulation: With patient Time For Goal Achievement: 05/11/15 Potential to Achieve Goals: Fair    Frequency Min 3X/week   Barriers to discharge        Co-evaluation               End of Session Equipment Utilized During Treatment: Gait belt Activity Tolerance: Patient limited by fatigue Patient left: in bed;with call bell/phone within reach Nurse  Communication: Mobility status         Time: 4446-1901 PT Time Calculation (min) (ACUTE ONLY): 16 min   Charges:   PT Evaluation $Initial PT Evaluation Tier I: 1 Procedure     PT G CodesKenyon Ana May 08, 2015, 12:23 PM

## 2015-04-27 NOTE — Progress Notes (Addendum)
Patient ID: Anthony Bolton, male   DOB: 12-06-1950, 64 y.o.   MRN: [id number] [facility] PROGRESS NOTE  Anthony Bolton PFX:[id number] DOB: 1950/11/19 DOA: 04/26/2015 PCP: Anthony Peers, MD  Brief narrative:    64 y.o. male with past medical history of hyperlipidemia COPD, severe hearing loss, systolic congestive heart failure (EF 20-25%), hyperlipidemia who presented with nausea, vomiting and abdominal pain for past 7 days prior to this admission. He has had about 10 episodes of emesis eawch day.   Chest x-ray showed mild patchy opacification in the medial right base. CT abdomen/pelvis showed high-grade small bowel obstruction. Surgery has seen the pt in consultation.  Barrier to discharge: home once SBO resolves.  Assessment/Plan:    Principal Problem:   SBO (small bowel obstruction) / Nausea and vomiting / abdominal pain - Conservative management per surgery - Continue NG tube for decompression - Continue NPO for now - Continue IV fluids for hydration - Continue antiemetics as needed for N/V   Active Problems:   Lobar pneumonia, Leukocytosis - Seen on CXR in right lung base - Continue zosyn for now    COPD (chronic obstructive pulmonary disease) - Stable - Not in exacerbation - Continue current inhalers and nebulizer tx - Instead of prednisone use IV solumedrol since he is NPO    Tobacco abuse - Smoking cessation counseling provided    Protein-calorie malnutrition, severe - Continue nutritional supplementation    Chronic systolic CHF - Last 2 D ECHO with EF of 25 % - Compensated    HLD (hyperlipidemia) - Continue statin therapy    Pressure ulcer, stage 1  - WOC consulted     DVT Prophylaxis  - SCD's bilaterally    Code Status: Full.  Family Communication:  plan of care discussed with the patient Disposition Plan: Home once SBO resolves.   IV access:  Peripheral IV  Procedures and diagnostic studies:    Ct Abdomen Pelvis Wo Contrast 04/26/2015   Markedly dilated stomach and proximal to mid small bowel loops compatible with high-grade partial small bowel obstruction. Exact transition point or cause not identified.  Moderate stool burden throughout the colon.  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   Electronically Signed   By: Rolm Baptise M.D.   On: 04/26/2015 20:45   Dg Chest 2 View 04/26/2015   Mild patchy opacification in the medial right base which may be due to atelectasis or early infection.   Electronically Signed   By: Marin Olp M.D.   On: 04/26/2015 20:35   Dg Abd 2 Views 04/27/2015 Multiple dilated loops of small bowel throughout the abdomen, compatible small bowel obstruction.  No free air.   Electronically Signed   By: Julian Hy M.D.   On: 04/27/2015 09:17    Medical Consultants:  Surgery  Other Consultants:  None  IAnti-Infectives:   None    Anthony Lenz, MD  [facility] Pager [phone]  Time spent in minutes: 25 minutes  If 7PM-7AM, please contact night-coverage [email or web address] Password [email or web address] 04/27/2015, 10:06 AM   LOS: 1 day    HPI/Subjective: No acute overnight events. Patient reports no nausea.  Objective: Filed Vitals:   04/26/15 2256 04/27/15 0240 04/27/15 0542 04/27/15 0954  BP: 123/88  113/80   Pulse: 83  84   Temp: 98.3 F (36.8 C)  98.5 F (36.9 C)   TempSrc: Oral  Oral   Resp: 12  16   Height:   5\' 9"  (1.753 m)  Weight: 48.5 kg (106 lb 14.8 oz)  45.2 kg (99 lb 10.4 oz)   SpO2: 100% 96% 95% 94%    Intake/Output Summary (Last 24 hours) at 04/27/15 1006 Last data filed at 04/27/15 0700  Gross per 24 hour  Intake    500 ml  Output   2051 ml  Net  -1551 ml    Exam:   General:  Pt is alert, follows commands appropriately, not in acute distress  Cardiovascular: Regular rate and rhythm, S1/S2, no murmurs  Respiratory: Clear to auscultation bilaterally, no wheezing, no crackles, no rhonchi  Abdomen: Soft, non tender, non distended, bowel  sounds present; NG tube in place  Extremities: No edema, pulses DP and PT palpable bilaterally  Neuro: Grossly nonfocal  Data Reviewed: Basic Metabolic Panel:  Recent Labs Lab 04/26/15 1900 04/27/15 0213  NA 135 135  K 4.1 4.0  CL 89* 94*  CO2 34* 30  GLUCOSE 119* 119*  BUN 44* 41*  CREATININE 0.97 0.77  CALCIUM 9.4 8.8*   Liver Function Tests:  Recent Labs Lab 04/26/15 1900 04/27/15 0213  AST 25 20  ALT 24 22  ALKPHOS 55 48  BILITOT 1.6* 1.3*  PROT 7.4 6.7  ALBUMIN 4.1 3.8    Recent Labs Lab 04/26/15 1900  LIPASE 26   No results for input(s): AMMONIA in the last 168 hours. CBC:  Recent Labs Lab 04/26/15 1900 04/27/15 0213  WBC 11.6* 12.0*  NEUTROABS 8.7*  --   HGB 16.0 15.5  HCT 47.9 44.4  MCV 93.2 92.5  PLT 143* 138*   Cardiac Enzymes: No results for input(s): CKTOTAL, CKMB, CKMBINDEX, TROPONINI in the last 168 hours. BNP: Invalid input(s): POCBNP CBG:  Recent Labs Lab 04/27/15 0729  GLUCAP 144*    No results found for this or any previous visit (from the past 240 hour(s)).   Scheduled Meds: . sodium chloride   Intravenous STAT  . antiseptic oral rinse  7 mL Mouth Rinse q12n4p  . aspirin EC  81 mg Oral Daily  . atorvastatin  40 mg Oral QHS  . budesonide-formoterol  2 puff Inhalation BID  . chlorhexidine  15 mL Mouth Rinse BID  . feeding supplement (ENSURE ENLIVE)  237 mL Oral BID BM  . guaiFENesin  1,200 mg Oral BID  . ipratropium-albuterol  3 mL Nebulization Q6H  . methylPREDNISolone (SOLU-MEDROL) injection  60 mg Intravenous BID  . nicotine  21 mg Transdermal Daily  . piperacillin-tazobactam (ZOSYN)  IV  3.375 g Intravenous Q8H  . predniSONE  20 mg Oral TID WC  . sodium chloride  3 mL Intravenous Q12H  . tiotropium  18 mcg Inhalation Daily  . traZODone  50 mg Oral QHS   Continuous Infusions:

## 2015-04-27 NOTE — Progress Notes (Signed)
Pt called out to use restroom. Upon entering room NG tube laying in bed with patient, dislodged. MD on call notified.

## 2015-04-27 NOTE — Plan of Care (Signed)
Problem: Phase I Progression Outcomes Goal: OOB as tolerated unless otherwise ordered Outcome: Completed/Met Date Met:  04/27/15 Up with PT today.

## 2015-04-27 NOTE — Progress Notes (Signed)
Patient ID: Anthony Bolton, male   DOB: Jan 01, 1951, 64 y.o.   MRN: [id number] [facility] Progress Note:   * No surgery found *  Subjective: Mental status is blunted by hearing deficiency Objective: Vital signs in last 24 hours: Temp:  [98.2 F (36.8 C)-98.5 F (36.9 C)] 98.5 F (36.9 C) (07/30 0542) Pulse Rate:  [83-94] 84 (07/30 0542) Resp:  [12-24] 16 (07/30 0542) BP: (113-131)/(80-88) 113/80 mmHg (07/30 0542) SpO2:  [93 %-100 %] 94 % (07/30 0954) Weight:  [45.2 kg (99 lb 10.4 oz)-48.5 kg (106 lb 14.8 oz)] 45.2 kg (99 lb 10.4 oz) (07/30 0542)  Intake/Output from previous day: 07/29 0701 - 07/30 0700 In: 500 [I.V.:500] Out: 2051 [Emesis/NG output:2050; Stool:1] Intake/Output this shift:    Physical Exam: Work of breathing is not labored  Lab Results:  Results for orders placed or performed during the hospital encounter of 04/26/15 (from the past 48 hour(s))  CBC with Differential     Status: Abnormal   Collection Time: 04/26/15  7:00 PM  Result Value Ref Range   WBC 11.6 (H) 4.0 - 10.5 K/uL   RBC 5.14 4.22 - 5.81 MIL/uL   Hemoglobin 16.0 13.0 - 17.0 g/dL   HCT 47.9 39.0 - 52.0 %   MCV 93.2 78.0 - 100.0 fL   MCH 31.1 26.0 - 34.0 pg   MCHC 33.4 30.0 - 36.0 g/dL   RDW 12.9 11.5 - 15.5 %   Platelets 143 (L) 150 - 400 K/uL   Neutrophils Relative % 75 43 - 77 %   Neutro Abs 8.7 (H) 1.7 - 7.7 K/uL   Lymphocytes Relative 8 (L) 12 - 46 %   Lymphs Abs 0.9 0.7 - 4.0 K/uL   Monocytes Relative 16 (H) 3 - 12 %   Monocytes Absolute 1.9 (H) 0.1 - 1.0 K/uL   Eosinophils Relative 1 0 - 5 %   Eosinophils Absolute 0.1 0.0 - 0.7 K/uL   Basophils Relative 0 0 - 1 %   Basophils Absolute 0.0 0.0 - 0.1 K/uL  Comprehensive metabolic panel     Status: Abnormal   Collection Time: 04/26/15  7:00 PM  Result Value Ref Range   Sodium 135 135 - 145 mmol/L   Potassium 4.1 3.5 - 5.1 mmol/L   Chloride 89 (L) 101 - 111 mmol/L   CO2 34 (H) 22 - 32 mmol/L   Glucose, Bld 119 (H) 65 -  99 mg/dL   BUN 44 (H) 6 - 20 mg/dL   Creatinine, Ser 0.97 0.61 - 1.24 mg/dL   Calcium 9.4 8.9 - 10.3 mg/dL   Total Protein 7.4 6.5 - 8.1 g/dL   Albumin 4.1 3.5 - 5.0 g/dL   AST 25 15 - 41 U/L   ALT 24 17 - 63 U/L   Alkaline Phosphatase 55 38 - 126 U/L   Total Bilirubin 1.6 (H) 0.3 - 1.2 mg/dL   GFR calc non Af Amer >60 >60 mL/min   GFR calc Af Amer >60 >60 mL/min    Comment: (NOTE) The eGFR has been calculated using the CKD EPI equation. This calculation has not been validated in all clinical situations. eGFR's persistently <60 mL/min signify possible Chronic Kidney Disease.    Anion gap 12 5 - 15  Lipase, blood     Status: None   Collection Time: 04/26/15  7:00 PM  Result Value Ref Range   Lipase 26 22 - 51 U/L  Type and screen     Status:  None   Collection Time: 04/27/15 12:00 AM  Result Value Ref Range   ABO/RH(D) A POS    Antibody Screen NEG    Sample Expiration 04/30/2015   Lactic acid, plasma     Status: None   Collection Time: 04/27/15 12:00 AM  Result Value Ref Range   Lactic Acid, Venous 1.7 0.5 - 2.0 mmol/L  Procalcitonin     Status: None   Collection Time: 04/27/15 12:00 AM  Result Value Ref Range   Procalcitonin <0.10 ng/mL    Comment:        Interpretation: PCT (Procalcitonin) <= 0.5 ng/mL: Systemic infection (sepsis) is not likely. Local bacterial infection is possible. (NOTE)         ICU PCT Algorithm               Non ICU PCT Algorithm    ----------------------------     ------------------------------         PCT < 0.25 ng/mL                 PCT < 0.1 ng/mL     Stopping of antibiotics            Stopping of antibiotics       strongly encouraged.               strongly encouraged.    ----------------------------     ------------------------------       PCT level decrease by               PCT < 0.25 ng/mL       >= 80% from peak PCT       OR PCT 0.25 - 0.5 ng/mL          Stopping of antibiotics                                             encouraged.      Stopping of antibiotics           encouraged.    ----------------------------     ------------------------------       PCT level decrease by              PCT >= 0.25 ng/mL       < 80% from peak PCT        AND PCT >= 0.5 ng/mL            Continuin g antibiotics                                              encouraged.       Continuing antibiotics            encouraged.    ----------------------------     ------------------------------     PCT level increase compared          PCT > 0.5 ng/mL         with peak PCT AND          PCT >= 0.5 ng/mL             Escalation of antibiotics  strongly encouraged.      Escalation of antibiotics        strongly encouraged.   Protime-INR     Status: None   Collection Time: 04/27/15 12:00 AM  Result Value Ref Range   Prothrombin Time 13.9 11.6 - 15.2 seconds   INR 1.05 0.00 - 1.49  APTT     Status: None   Collection Time: 04/27/15 12:00 AM  Result Value Ref Range   aPTT 25 24 - 37 seconds  ABO/Rh     Status: None   Collection Time: 04/27/15 12:01 AM  Result Value Ref Range   ABO/RH(D) A POS   Brain natriuretic peptide     Status: None   Collection Time: 04/27/15  2:13 AM  Result Value Ref Range   B Natriuretic Peptide 28.1 0.0 - 100.0 pg/mL  Cortisol-am, blood     Status: None   Collection Time: 04/27/15  2:13 AM  Result Value Ref Range   Cortisol - AM 21.6 6.7 - 22.6 ug/dL    Comment: Performed at [facility]  Lactic acid, plasma     Status: None   Collection Time: 04/27/15  2:13 AM  Result Value Ref Range   Lactic Acid, Venous 1.7 0.5 - 2.0 mmol/L  Comprehensive metabolic panel     Status: Abnormal   Collection Time: 04/27/15  2:13 AM  Result Value Ref Range   Sodium 135 135 - 145 mmol/L   Potassium 4.0 3.5 - 5.1 mmol/L   Chloride 94 (L) 101 - 111 mmol/L   CO2 30 22 - 32 mmol/L   Glucose, Bld 119 (H) 65 - 99 mg/dL   BUN 41 (H) 6 - 20 mg/dL   Creatinine, Ser 0.77 0.61 - 1.24 mg/dL    Calcium 8.8 (L) 8.9 - 10.3 mg/dL   Total Protein 6.7 6.5 - 8.1 g/dL   Albumin 3.8 3.5 - 5.0 g/dL   AST 20 15 - 41 U/L   ALT 22 17 - 63 U/L   Alkaline Phosphatase 48 38 - 126 U/L   Total Bilirubin 1.3 (H) 0.3 - 1.2 mg/dL   GFR calc non Af Amer >60 >60 mL/min   GFR calc Af Amer >60 >60 mL/min    Comment: (NOTE) The eGFR has been calculated using the CKD EPI equation. This calculation has not been validated in all clinical situations. eGFR's persistently <60 mL/min signify possible Chronic Kidney Disease.    Anion gap 11 5 - 15  CBC     Status: Abnormal   Collection Time: 04/27/15  2:13 AM  Result Value Ref Range   WBC 12.0 (H) 4.0 - 10.5 K/uL   RBC 4.80 4.22 - 5.81 MIL/uL   Hemoglobin 15.5 13.0 - 17.0 g/dL   HCT 44.4 39.0 - 52.0 %   MCV 92.5 78.0 - 100.0 fL   MCH 32.3 26.0 - 34.0 pg   MCHC 34.9 30.0 - 36.0 g/dL   RDW 13.0 11.5 - 15.5 %   Platelets 138 (L) 150 - 400 K/uL  Prealbumin     Status: Abnormal   Collection Time: 04/27/15  2:13 AM  Result Value Ref Range   Prealbumin 17.4 (L) 18 - 38 mg/dL    Comment: Performed at [facility]  Glucose, capillary     Status: Abnormal   Collection Time: 04/27/15  7:29 AM  Result Value Ref Range   Glucose-Capillary 144 (H) 65 - 99 mg/dL   Comment 1 Notify RN  Radiology/Results: Ct Abdomen Pelvis Wo Contrast  04/26/2015   CLINICAL DATA:  Left abdominal pain for 9 days.  Nausea, vomiting.  EXAM: CT ABDOMEN AND PELVIS WITHOUT CONTRAST  TECHNIQUE: Multidetector CT imaging of the abdomen and pelvis was performed following the standard protocol without IV contrast.  COMPARISON:  None.  FINDINGS: Lung bases are clear.  No effusions.  Heart is normal size.  Stomach and the proximal to mid small bowel loops are markedly dilated compatible with high grade small bowel obstruction. Exact transition is not identified. Mid to distal small bowel loops and colon are decompressed. Moderate stool burden throughout the colon. No free fluid,  free air or adenopathy.  Liver, gallbladder, spleen, pancreas, right adrenal and kidneys are unremarkable. Heterogeneous mass within the left adrenal gland measures 4.2 cm, not significantly changed since prior MRI when this was felt to most likely reflect partially necrotic at adenoma.  Aortic and iliac calcifications. No aneurysm. No acute bony abnormality or focal bone lesion.  IMPRESSION: Markedly dilated stomach and proximal to mid small bowel loops compatible with high-grade partial small bowel obstruction. Exact transition point or cause not identified.  Moderate stool burden throughout the colon.  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   Electronically Signed   By: Rolm Baptise M.D.   On: 04/26/2015 20:45   Dg Chest 2 View  04/26/2015   CLINICAL DATA:  Left-sided abdominal pain 9 days with nausea and vomiting.  EXAM: CHEST  2 VIEW  COMPARISON:  04/06/2015  FINDINGS: Lungs are adequately inflated with mild patchy opacification in the medial right base as this may be due to atelectasis or early infection. No evidence of effusion. Cardiomediastinal silhouette and remainder of the exam is unchanged.  IMPRESSION: Mild patchy opacification in the medial right base which may be due to atelectasis or early infection.   Electronically Signed   By: Marin Olp M.D.   On: 04/26/2015 20:35   Dg Abd 2 Views  04/27/2015   CLINICAL DATA:  Abdominal pain, nausea/vomiting, follow-up small bowel obstruction  EXAM: ABDOMEN - 2 VIEW  COMPARISON:  CT abdomen pelvis dated 04/26/2015  FINDINGS: Multiple dilated loops of small bowel throughout the abdomen, compatible with small bowel obstruction.  No evidence of free air on the lateral decubitus view.  Visualized osseous structures are within normal limits.  IMPRESSION: Multiple dilated loops of small bowel throughout the abdomen, compatible small bowel obstruction.  No free air.   Electronically Signed   By: Julian Hy M.D.   On: 04/27/2015  09:17    Anti-infectives: Anti-infectives    Start     Dose/Rate Route Frequency Ordered Stop   04/26/15 2359  piperacillin-tazobactam (ZOSYN) IVPB 3.375 g     3.375 g 12.5 mL/hr over 240 Minutes Intravenous Every 8 hours 04/26/15 2312        Assessment/Plan: Problem List: Patient Active Problem List   Diagnosis Date Noted  . Pressure ulcer 04/27/2015  . SBO (small bowel obstruction) 04/26/2015  . SOB (shortness of breath) 04/26/2015  . HLD (hyperlipidemia)   . Malnutrition of moderate degree 03/02/2015  . COPD exacerbation 03/01/2015  . Systolic CHF 16/06/9603  . Infestation by bed bug 03/01/2015  . Protein-calorie malnutrition, severe 11/07/2013  . Hyperthyroidism 11/06/2013  . Mass of adrenal gland 11/04/2013  . Tobacco abuse 11/04/2013  . Edentulous 11/04/2013  . Hyponatremia 10/31/2013  . COPD (chronic obstructive pulmonary disease) 10/31/2013    Continue NG suction.  Observation.   *  No surgery found *    LOS: 1 day   Matt B. Hassell Done, MD, [facility] [phone] beeper [phone]  04/27/2015 10:55 AM

## 2015-04-27 NOTE — Progress Notes (Signed)
OT Cancellation Note  Patient Details Name: Anthony Bolton MRN: [id number] DOB: 15-Oct-1950   Cancelled Treatment:    Reason Eval/Treat Not Completed: Fatigue/lethargy limiting ability to participate  .  Will check on  next day/ Per nurse- pt put himself back to bed after PT helped him OOB  [employer], Thereasa Parkin 04/27/2015, 1:13 PM

## 2015-04-28 ENCOUNTER — Inpatient Hospital Stay (HOSPITAL_COMMUNITY): Payer: Medicaid Other

## 2015-04-28 DIAGNOSIS — J181 Lobar pneumonia, unspecified organism: Secondary | ICD-10-CM

## 2015-04-28 LAB — GLUCOSE, CAPILLARY: Glucose-Capillary: 97 mg/dL (ref 65–99)

## 2015-04-28 MED ORDER — IPRATROPIUM-ALBUTEROL 0.5-2.5 (3) MG/3ML IN SOLN: 3.0000 mL | Freq: Four times a day (QID) | RESPIRATORY_TRACT | Status: DC | PRN

## 2015-04-28 MED ORDER — PHENOL 1.4 % MT LIQD
1.0000 | OROMUCOSAL | Status: DC | PRN
  Filled 2015-04-28: qty 177

## 2015-04-28 MED ORDER — SODIUM CHLORIDE 0.9 % IV SOLN
INTRAVENOUS | Status: AC
  Administered 2015-04-28: 01:00:00 via INTRAVENOUS

## 2015-04-28 NOTE — Progress Notes (Signed)
Dr. Lucia Gaskins aware phone NGT out. No new orders except to leave tube out.

## 2015-04-28 NOTE — Progress Notes (Signed)
Dr. Lucia Gaskins notified via text pt pulled NGT out this afternoon. NGT found in trash. Pt reported "that tube was hurting my nose". Pt recently had large BM and is tolerating clear liquids with no nausea and/or vomiting. Denies pain. Recently bathed and in good spirits. Awaiting response from MD.

## 2015-04-28 NOTE — Progress Notes (Signed)
Initial Nutrition Assessment  DOCUMENTATION CODES:   Severe malnutrition in context of chronic illness, Underweight  INTERVENTION:   -Diet advancement per MD -Continue Ensure Enlive po BID, each supplement provides 350 kcal and 20 grams of protein -Recommend Monitor magnesium, potassium, and phosphorus daily for at least 3 days, MD to replete as needed, as pt is at risk for refeeding syndrome given severe malnutrition status. -RD to continue to monitor  NUTRITION DIAGNOSIS:   Malnutrition related to chronic illness as evidenced by percent weight loss, severe depletion of body fat, severe depletion of muscle mass.  GOAL:   Patient will meet greater than or equal to 90% of their needs  MONITOR:   PO intake, Supplement acceptance, Diet advancement, Labs, Weight trends, Skin, I & O's  REASON FOR ASSESSMENT:   Malnutrition Screening Tool    ASSESSMENT:   64 y.o. male with past medical history of hyperlipidemia COPD, severe hearing loss, systolic congestive heart failure (EF 20-25%), hyperlipidemia who presented with nausea, vomiting and abdominal pain for past 7 days prior to this admission. He has had about 10 episodes of emesis eawch day.   Per RN, pt is extremely hard of hearing. RD attempted to ask pt questions but he was busy eating his clear liquid tray. Pt was able to tell me he was very hungry. Very difficult to gather information from patient.  Per weight history documentation, pt has lost 13 lb since 2/23 (11% weight loss x 5 months, significant for time frame).  Nutrition-Focused physical exam completed. Findings are severe fat depletion, severe muscle depletion, and no edema. Recommend continue Ensure supplements.  Labs reviewed: CBGs: 97-144 Elevated BUN  Diet Order:  Diet clear liquid Room service appropriate?: Yes; Fluid consistency:: Thin  Skin:  Wound (see comment) (Stage I hip ulcer)  Last BM:  7/30  Height:   Ht Readings from Last 1 Encounters:   04/27/15 5\' 9"  (1.753 m)    Weight:   Wt Readings from Last 1 Encounters:  04/28/15 102 lb 8.2 oz (46.5 kg)    Ideal Body Weight:  72.7 kg  BMI:  Body mass index is 15.13 kg/(m^2).  Estimated Nutritional Needs:   Kcal:  1500-1700  Protein:  70-80g  Fluid:  1.6L/day  EDUCATION NEEDS:   No education needs identified at this time  Clayton Bibles, MS, RD, LDN Pager: [phone] After Hours Pager: [phone]

## 2015-04-28 NOTE — Clinical Social Work Note (Signed)
CSW attempted to assess pt but he was asleep  CSW called the sons phone number that was on the face sheet and was told it is the wrong number  CSW will follow up with pt another day  .Dede Query, LCSW [facility] Clinical Social Worker - Weekend Coverage cell #: [phone]

## 2015-04-28 NOTE — Progress Notes (Signed)
Patient ID: Anthony Bolton, male   DOB: [birth year], 64 y.o.   MRN: [id number] [facility] Progress Note:   * No surgery found *  Subjective: Mental status is clear and cooperative3 Objective: Vital signs in last 24 hours: Temp:  [97 F (36.1 C)-98.5 F (36.9 C)] 98.5 F (36.9 C) (07/31 0618) Pulse Rate:  [67-85] 85 (07/31 0618) Resp:  [18-19] 18 (07/31 0618) BP: (114-125)/(78-82) 114/78 mmHg (07/31 0618) SpO2:  [94 %-98 %] 94 % (07/31 1053) Weight:  [46.5 kg (102 lb 8.2 oz)] 46.5 kg (102 lb 8.2 oz) (07/31 0618)  Intake/Output from previous day: 07/30 0701 - 07/31 0700 In: 0  Out: 2725 [Urine:975; Emesis/NG output:1750] Intake/Output this shift: Total I/O In: 0  Out: 75 [Urine:75]  Physical Exam: Work of breathing is not elevated.  No abdominal pain.  Passing flatus and had a bm.    Lab Results:  Results for orders placed or performed during the hospital encounter of 04/26/15 (from the past 48 hour(s))  CBC with Differential     Status: Abnormal   Collection Time: 04/26/15  7:00 PM  Result Value Ref Range   WBC 11.6 (H) 4.0 - 10.5 K/uL   RBC 5.14 4.22 - 5.81 MIL/uL   Hemoglobin 16.0 13.0 - 17.0 g/dL   HCT 47.9 39.0 - 52.0 %   MCV 93.2 78.0 - 100.0 fL   MCH 31.1 26.0 - 34.0 pg   MCHC 33.4 30.0 - 36.0 g/dL   RDW 12.9 11.5 - 15.5 %   Platelets 143 (L) 150 - 400 K/uL   Neutrophils Relative % 75 43 - 77 %   Neutro Abs 8.7 (H) 1.7 - 7.7 K/uL   Lymphocytes Relative 8 (L) 12 - 46 %   Lymphs Abs 0.9 0.7 - 4.0 K/uL   Monocytes Relative 16 (H) 3 - 12 %   Monocytes Absolute 1.9 (H) 0.1 - 1.0 K/uL   Eosinophils Relative 1 0 - 5 %   Eosinophils Absolute 0.1 0.0 - 0.7 K/uL   Basophils Relative 0 0 - 1 %   Basophils Absolute 0.0 0.0 - 0.1 K/uL  Comprehensive metabolic panel     Status: Abnormal   Collection Time: 04/26/15  7:00 PM  Result Value Ref Range   Sodium 135 135 - 145 mmol/L   Potassium 4.1 3.5 - 5.1 mmol/L   Chloride 89 (L) 101 - 111 mmol/L   CO2 34 (H)  22 - 32 mmol/L   Glucose, Bld 119 (H) 65 - 99 mg/dL   BUN 44 (H) 6 - 20 mg/dL   Creatinine, Ser 0.97 0.61 - 1.24 mg/dL   Calcium 9.4 8.9 - 10.3 mg/dL   Total Protein 7.4 6.5 - 8.1 g/dL   Albumin 4.1 3.5 - 5.0 g/dL   AST 25 15 - 41 U/L   ALT 24 17 - 63 U/L   Alkaline Phosphatase 55 38 - 126 U/L   Total Bilirubin 1.6 (H) 0.3 - 1.2 mg/dL   GFR calc non Af Amer >60 >60 mL/min   GFR calc Af Amer >60 >60 mL/min    Comment: (NOTE) The eGFR has been calculated using the CKD EPI equation. This calculation has not been validated in all clinical situations. eGFR's persistently <60 mL/min signify possible Chronic Kidney Disease.    Anion gap 12 5 - 15  Lipase, blood     Status: None   Collection Time: 04/26/15  7:00 PM  Result Value Ref Range   Lipase 26  22 - 51 U/L  Type and screen     Status: None   Collection Time: 04/27/15 12:00 AM  Result Value Ref Range   ABO/RH(D) A POS    Antibody Screen NEG    Sample Expiration 04/30/2015   Lactic acid, plasma     Status: None   Collection Time: 04/27/15 12:00 AM  Result Value Ref Range   Lactic Acid, Venous 1.7 0.5 - 2.0 mmol/L  Procalcitonin     Status: None   Collection Time: 04/27/15 12:00 AM  Result Value Ref Range   Procalcitonin <0.10 ng/mL    Comment:        Interpretation: PCT (Procalcitonin) <= 0.5 ng/mL: Systemic infection (sepsis) is not likely. Local bacterial infection is possible. (NOTE)         ICU PCT Algorithm               Non ICU PCT Algorithm    ----------------------------     ------------------------------         PCT < 0.25 ng/mL                 PCT < 0.1 ng/mL     Stopping of antibiotics            Stopping of antibiotics       strongly encouraged.               strongly encouraged.    ----------------------------     ------------------------------       PCT level decrease by               PCT < 0.25 ng/mL       >= 80% from peak PCT       OR PCT 0.25 - 0.5 ng/mL          Stopping of antibiotics                                              encouraged.     Stopping of antibiotics           encouraged.    ----------------------------     ------------------------------       PCT level decrease by              PCT >= 0.25 ng/mL       < 80% from peak PCT        AND PCT >= 0.5 ng/mL            Continuin g antibiotics                                              encouraged.       Continuing antibiotics            encouraged.    ----------------------------     ------------------------------     PCT level increase compared          PCT > 0.5 ng/mL         with peak PCT AND          PCT >= 0.5 ng/mL             Escalation of antibiotics  strongly encouraged.      Escalation of antibiotics        strongly encouraged.   Protime-INR     Status: None   Collection Time: 04/27/15 12:00 AM  Result Value Ref Range   Prothrombin Time 13.9 11.6 - 15.2 seconds   INR 1.05 0.00 - 1.49  APTT     Status: None   Collection Time: 04/27/15 12:00 AM  Result Value Ref Range   aPTT 25 24 - 37 seconds  ABO/Rh     Status: None   Collection Time: 04/27/15 12:01 AM  Result Value Ref Range   ABO/RH(D) A POS   Brain natriuretic peptide     Status: None   Collection Time: 04/27/15  2:13 AM  Result Value Ref Range   B Natriuretic Peptide 28.1 0.0 - 100.0 pg/mL  Cortisol-am, blood     Status: None   Collection Time: 04/27/15  2:13 AM  Result Value Ref Range   Cortisol - AM 21.6 6.7 - 22.6 ug/dL    Comment: Performed at [facility]  Lactic acid, plasma     Status: None   Collection Time: 04/27/15  2:13 AM  Result Value Ref Range   Lactic Acid, Venous 1.7 0.5 - 2.0 mmol/L  Comprehensive metabolic panel     Status: Abnormal   Collection Time: 04/27/15  2:13 AM  Result Value Ref Range   Sodium 135 135 - 145 mmol/L   Potassium 4.0 3.5 - 5.1 mmol/L   Chloride 94 (L) 101 - 111 mmol/L   CO2 30 22 - 32 mmol/L   Glucose, Bld 119 (H) 65 - 99 mg/dL   BUN 41 (H) 6 - 20 mg/dL    Creatinine, Ser 0.77 0.61 - 1.24 mg/dL   Calcium 8.8 (L) 8.9 - 10.3 mg/dL   Total Protein 6.7 6.5 - 8.1 g/dL   Albumin 3.8 3.5 - 5.0 g/dL   AST 20 15 - 41 U/L   ALT 22 17 - 63 U/L   Alkaline Phosphatase 48 38 - 126 U/L   Total Bilirubin 1.3 (H) 0.3 - 1.2 mg/dL   GFR calc non Af Amer >60 >60 mL/min   GFR calc Af Amer >60 >60 mL/min    Comment: (NOTE) The eGFR has been calculated using the CKD EPI equation. This calculation has not been validated in all clinical situations. eGFR's persistently <60 mL/min signify possible Chronic Kidney Disease.    Anion gap 11 5 - 15  CBC     Status: Abnormal   Collection Time: 04/27/15  2:13 AM  Result Value Ref Range   WBC 12.0 (H) 4.0 - 10.5 K/uL   RBC 4.80 4.22 - 5.81 MIL/uL   Hemoglobin 15.5 13.0 - 17.0 g/dL   HCT 44.4 39.0 - 52.0 %   MCV 92.5 78.0 - 100.0 fL   MCH 32.3 26.0 - 34.0 pg   MCHC 34.9 30.0 - 36.0 g/dL   RDW 13.0 11.5 - 15.5 %   Platelets 138 (L) 150 - 400 K/uL  Prealbumin     Status: Abnormal   Collection Time: 04/27/15  2:13 AM  Result Value Ref Range   Prealbumin 17.4 (L) 18 - 38 mg/dL    Comment: Performed at [facility]  Glucose, capillary     Status: Abnormal   Collection Time: 04/27/15  7:29 AM  Result Value Ref Range   Glucose-Capillary 144 (H) 65 - 99 mg/dL   Comment 1 Notify RN   Urinalysis, Routine  w reflex microscopic (not at [facility])     Status: Abnormal   Collection Time: 04/27/15  5:30 PM  Result Value Ref Range   Color, Urine YELLOW YELLOW   APPearance CLOUDY (A) CLEAR   Specific Gravity, Urine 1.025 1.005 - 1.030   pH 5.0 5.0 - 8.0   Glucose, UA NEGATIVE NEGATIVE mg/dL   Hgb urine dipstick NEGATIVE NEGATIVE   Bilirubin Urine NEGATIVE NEGATIVE   Ketones, ur NEGATIVE NEGATIVE mg/dL   Protein, ur NEGATIVE NEGATIVE mg/dL   Urobilinogen, UA 0.2 0.0 - 1.0 mg/dL   Nitrite NEGATIVE NEGATIVE   Leukocytes, UA NEGATIVE NEGATIVE    Comment: MICROSCOPIC NOT DONE ON URINES WITH NEGATIVE PROTEIN, BLOOD,  LEUKOCYTES, NITRITE, OR GLUCOSE <1000 mg/dL.  Glucose, capillary     Status: None   Collection Time: 04/28/15  7:34 AM  Result Value Ref Range   Glucose-Capillary 97 65 - 99 mg/dL   Comment 1 Notify RN     Radiology/Results: Ct Abdomen Pelvis Wo Contrast  04/26/2015   CLINICAL DATA:  Left abdominal pain for 9 days.  Nausea, vomiting.  EXAM: CT ABDOMEN AND PELVIS WITHOUT CONTRAST  TECHNIQUE: Multidetector CT imaging of the abdomen and pelvis was performed following the standard protocol without IV contrast.  COMPARISON:  None.  FINDINGS: Lung bases are clear.  No effusions.  Heart is normal size.  Stomach and the proximal to mid small bowel loops are markedly dilated compatible with high grade small bowel obstruction. Exact transition is not identified. Mid to distal small bowel loops and colon are decompressed. Moderate stool burden throughout the colon. No free fluid, free air or adenopathy.  Liver, gallbladder, spleen, pancreas, right adrenal and kidneys are unremarkable. Heterogeneous mass within the left adrenal gland measures 4.2 cm, not significantly changed since prior MRI when this was felt to most likely reflect partially necrotic at adenoma.  Aortic and iliac calcifications. No aneurysm. No acute bony abnormality or focal bone lesion.  IMPRESSION: Markedly dilated stomach and proximal to mid small bowel loops compatible with high-grade partial small bowel obstruction. Exact transition point or cause not identified.  Moderate stool burden throughout the colon.  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   Electronically Signed   By: Rolm Baptise M.D.   On: 04/26/2015 20:45   Dg Chest 2 View  04/26/2015   CLINICAL DATA:  Left-sided abdominal pain 9 days with nausea and vomiting.  EXAM: CHEST  2 VIEW  COMPARISON:  04/06/2015  FINDINGS: Lungs are adequately inflated with mild patchy opacification in the medial right base as this may be due to atelectasis or early infection. No  evidence of effusion. Cardiomediastinal silhouette and remainder of the exam is unchanged.  IMPRESSION: Mild patchy opacification in the medial right base which may be due to atelectasis or early infection.   Electronically Signed   By: Marin Olp M.D.   On: 04/26/2015 20:35   Dg Abd 2 Views  04/27/2015   CLINICAL DATA:  Abdominal pain, nausea/vomiting, follow-up small bowel obstruction  EXAM: ABDOMEN - 2 VIEW  COMPARISON:  CT abdomen pelvis dated 04/26/2015  FINDINGS: Multiple dilated loops of small bowel throughout the abdomen, compatible with small bowel obstruction.  No evidence of free air on the lateral decubitus view.  Visualized osseous structures are within normal limits.  IMPRESSION: Multiple dilated loops of small bowel throughout the abdomen, compatible small bowel obstruction.  No free air.   Electronically Signed   By: Bertis Ruddy  [place] Pink M.D.   On: 04/27/2015 09:17   Dg Abd Portable 1v  04/28/2015   CLINICAL DATA:  Followup small bowel obstruction.  EXAM: PORTABLE ABDOMEN - 1 VIEW  COMPARISON:  04/27/2015  FINDINGS: Nasogastric tube tip remains in the proximal stomach. Markedly dilated small bowel loops in the right abdomen and pelvis show mildly increased dilatation compared to previous study. There is some gas and stool remaining in the transverse and left colon. This is consistent with a high-grade partial small bowel obstruction.  IMPRESSION: High-grade partial small bowel obstruction, with mild increase in degree of small bowel dilatation since prior study.   Electronically Signed   By: Earle Gell M.D.   On: 04/28/2015 10:12    Anti-infectives: Anti-infectives    Start     Dose/Rate Route Frequency Ordered Stop   04/26/15 2359  piperacillin-tazobactam (ZOSYN) IVPB 3.375 g     3.375 g 12.5 mL/hr over 240 Minutes Intravenous Every 8 hours 04/26/15 2312        Assessment/Plan: Problem List: Patient Active Problem List   Diagnosis Date Noted  . Pressure ulcer 04/27/2015  .  SBO (small bowel obstruction) 04/26/2015  . SOB (shortness of breath) 04/26/2015  . HLD (hyperlipidemia)   . Malnutrition of moderate degree 03/02/2015  . COPD exacerbation 03/01/2015  . Systolic CHF 97/84/7841  . Infestation by bed bug 03/01/2015  . Protein-calorie malnutrition, severe 11/07/2013  . Hyperthyroidism 11/06/2013  . Mass of adrenal gland 11/04/2013  . Tobacco abuse 11/04/2013  . Edentulous 11/04/2013  . Hyponatremia 10/31/2013  . COPD (chronic obstructive pulmonary disease) 10/31/2013    Will keep NG clamped and try clear liquids.   * No surgery found *    LOS: 2 days   Matt B. Hassell Done, MD, [facility] [phone] beeper [phone]  04/28/2015 11:08 AM

## 2015-04-28 NOTE — Progress Notes (Signed)
Utilization Review Completed.Chelise Hanger T7/31/2016  

## 2015-04-28 NOTE — Progress Notes (Signed)
Patient ID: Anthony Bolton, male   DOB: [birth year], 64 y.o.   MRN: [id number] [facility] PROGRESS NOTE  Anthony Bolton DGL:[id number] DOB: 1951-06-18 DOA: 04/26/2015 PCP: [name], MD  Brief narrative:    64 y.o. male with past medical history of hyperlipidemia COPD, severe hearing loss, systolic congestive heart failure (EF 20-25%), hyperlipidemia who presented with nausea, vomiting and abdominal pain for past 7 days prior to this admission. He has had about 10 episodes of emesis eawch day.   Chest x-ray showed mild patchy opacification in the medial right base. CT abdomen/pelvis showed high-grade small bowel obstruction. Surgery has seen the pt in consultation.  Barrier to discharge: home once SBO resolves.  Assessment/Plan:    Principal Problem:   SBO (small bowel obstruction) / Nausea and vomiting / abdominal pain - CT abdomen demonstrated high grade SBO. Plain abdominal films also demonstrated multiple dilated loops of small bowel throughout the abdomen compatible with SBO. - Repeat abdominal x ray today - Continue conservative management per surgery - Continue NG tube for decompression - Continue NPO for now - Continue IV fluids for hydration - Continue antiemetics as needed for N/V   Active Problems:   Lobar pneumonia, Leukocytosis - Seen on CXR in right lung base - Continue zosyn for now    COPD (chronic obstructive pulmonary disease) - Stable - Not in exacerbation - Continue Symbicort inhaler BID, Duoneb nebulizer Q 6 hours as needed for shortness of breath or wheezing, spiriva daily - Continue solumedrol 20 mg IV daily since he is NPO (he is on chronic low dose prednisone at home)    Tobacco abuse - Smoking cessation counseling provided - Continue nicotine patch     Protein-calorie malnutrition, severe - Continue nutritional supplementation    Chronic systolic CHF - Last 2 D ECHO with EF of 25 % - Compensated    HLD (hyperlipidemia) - Continue  statin therapy    Pressure ulcer, stage 1  - WOC consulted     DVT Prophylaxis  - SCD's bilaterally    Code Status: Full.  Family Communication:  plan of care discussed with the patient Disposition Plan: Home once SBO resolves.   IV access:  Peripheral IV  Procedures and diagnostic studies:    Ct Abdomen Pelvis Wo Contrast 04/26/2015  Markedly dilated stomach and proximal to mid small bowel loops compatible with high-grade partial small bowel obstruction. Exact transition point or cause not identified.  Moderate stool burden throughout the colon.  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   Electronically Signed   By: Rolm Baptise M.D.   On: 04/26/2015 20:45   Dg Chest 2 View 04/26/2015   Mild patchy opacification in the medial right base which may be due to atelectasis or early infection.   Electronically Signed   By: Marin Olp M.D.   On: 04/26/2015 20:35   Dg Abd 2 Views 04/27/2015 Multiple dilated loops of small bowel throughout the abdomen, compatible small bowel obstruction.  No free air.   Electronically Signed   By: Julian Hy M.D.   On: 04/27/2015 09:17    Medical Consultants:  Surgery  Other Consultants:  None  IAnti-Infectives:   None    Leisa Lenz, MD  [facility] Pager [phone]  Time spent in minutes: 25 minutes  If 7PM-7AM, please contact night-coverage [email or web address] Password [facility] 04/28/2015, 8:37 AM   LOS: 2 days    HPI/Subjective: No acute overnight events. Patient reports no nausea.  Objective: Filed Vitals:   04/27/15 2006 04/27/15 2149 04/28/15 0136 04/28/15 0618  BP:  115/82  114/78  Pulse:  80  85  Temp:  98.5 F (36.9 C)  98.5 F (36.9 C)  TempSrc:  Oral  Oral  Resp:  19  18  Height:      Weight:    46.5 kg (102 lb 8.2 oz)  SpO2: 98% 97% 98% 98%    Intake/Output Summary (Last 24 hours) at 04/28/15 0837 Last data filed at 04/28/15 0421  Gross per 24 hour  Intake      0 ml  Output   2725  ml  Net  -2725 ml    Exam:   General:  Pt is alert, not in acute distress  Cardiovascular: Regular rate and rhythm, S1/S2 appreciated   Respiratory: No wheezing, no crackles, no rhonchi  Abdomen: NG tube in place, non tender abdomen   Extremities: No leg swelling, pulses palpable   Neuro: Nonfocal  Data Reviewed: Basic Metabolic Panel:  Recent Labs Lab 04/26/15 1900 04/27/15 0213  NA 135 135  K 4.1 4.0  CL 89* 94*  CO2 34* 30  GLUCOSE 119* 119*  BUN 44* 41*  CREATININE 0.97 0.77  CALCIUM 9.4 8.8*   Liver Function Tests:  Recent Labs Lab 04/26/15 1900 04/27/15 0213  AST 25 20  ALT 24 22  ALKPHOS 55 48  BILITOT 1.6* 1.3*  PROT 7.4 6.7  ALBUMIN 4.1 3.8    Recent Labs Lab 04/26/15 1900  LIPASE 26   No results for input(s): AMMONIA in the last 168 hours. CBC:  Recent Labs Lab 04/26/15 1900 04/27/15 0213  WBC 11.6* 12.0*  NEUTROABS 8.7*  --   HGB 16.0 15.5  HCT 47.9 44.4  MCV 93.2 92.5  PLT 143* 138*   Cardiac Enzymes: No results for input(s): CKTOTAL, CKMB, CKMBINDEX, TROPONINI in the last 168 hours. BNP: Invalid input(s): POCBNP CBG:  Recent Labs Lab 04/27/15 0729 04/28/15 0734  GLUCAP 144* 97    No results found for this or any previous visit (from the past 240 hour(s)).   Scheduled Meds: . sodium chloride   Intravenous STAT  . antiseptic oral rinse  7 mL Mouth Rinse q12n4p  . aspirin EC  81 mg Oral Daily  . atorvastatin  40 mg Oral QHS  . budesonide-formoterol  2 puff Inhalation BID  . chlorhexidine  15 mL Mouth Rinse BID  . feeding supplement (ENSURE ENLIVE)  237 mL Oral BID BM  . guaiFENesin  1,200 mg Oral BID  . ipratropium-albuterol  3 mL Nebulization Q6H  . methylPREDNISolone (SOLU-MEDROL) injection  20 mg Intravenous Q24H  . nicotine  21 mg Transdermal Daily  . piperacillin-tazobactam (ZOSYN)  IV  3.375 g Intravenous Q8H  . sodium chloride  3 mL Intravenous Q12H  . tiotropium  18 mcg Inhalation Daily  . traZODone   50 mg Oral QHS   Continuous Infusions:

## 2015-04-28 NOTE — Discharge Instructions (Signed)

## 2015-04-28 NOTE — Evaluation (Signed)
Occupational Therapy Evaluation Patient Details Name: TAFT WORTHING MRN: [id number] DOB: 10/16/1950 Today's Date: 04/28/2015    History of Present Illness SENAY SISTRUNK is a 64 y.o. male adm with nausea, vomiting and abdominal pain;  PMHx: hyperlipidemia, COPD, severe hearing loss, systolic congestive heart failure (EF 20-25%), hyperlipidemia  .   Clinical Impression   This 64 yo male admitted with above presents to acute OT with mild decreased balance affecting his safety with BADLs and pt lives alone. He will benefit from continued OT on acute without need for follow up.    Follow Up Recommendations  No OT follow up    Equipment Recommendations  None recommended by OT       Precautions / Restrictions Precautions Precautions: Fall Precaution Comments: NGT; EF 20-25% Restrictions Weight Bearing Restrictions: No      Mobility Bed Mobility Overal bed mobility: Modified Independent                Transfers Overall transfer level: Needs assistance Equipment used: None Transfers: Sit to/from Stand Sit to Stand: Supervision         General transfer comment: min guard A for ambulation without AD         ADL Overall ADL's : Needs assistance/impaired                                       General ADL Comments: min guard A overall due to mild decreased balance when he is up on his feet (possibly due to no food intake for several days due to SBO)     Vision Additional Comments: No change from baseline          Pertinent Vitals/Pain Pain Assessment: No/denies pain     Hand Dominance Right   Extremity/Trunk Assessment Upper Extremity Assessment Upper Extremity Assessment: Overall WFL for tasks assessed           Communication Communication Communication: HOH   Cognition Arousal/Alertness: Awake/alert Behavior During Therapy: Agitated (due to tube in his nose and can't have anything to eat/drink) Overall Cognitive Status:  Within Functional Limits for tasks assessed                                Home Living Family/patient expects to be discharged to:: Private residence Living Arrangements: Alone   Type of Home: House Home Access: Stairs to enter Technical brewer of Steps: 7 Entrance Stairs-Rails: Left Home Layout: One level         Biochemist, clinical: Standard     Home Equipment: Cane - single point (home made [facility])   Additional Comments: takes cab or bus to store monthly for grocery shopping; states cab driver helps him get groceries inside ( he can't carry or do much at once d/t cardiopulmonary compromise)      Prior Functioning/Environment Level of Independence: Independent;Independent with assistive device(s)        Comments: very limited activity per pt    OT Diagnosis: Generalized weakness   OT Problem List: Decreased strength;Impaired balance (sitting and/or standing)   OT Treatment/Interventions: Self-care/ADL training;Balance training;Therapeutic activities    OT Goals(Current goals can be found in the care plan section) Acute Rehab OT Goals Patient Stated Goal: to be able to eat, drink, and go home OT Goal Formulation: With patient Time For Goal Achievement: 06/04/16 Potential to Achieve  Goals: Good  OT Frequency: Min 2X/week              End of Session Equipment Utilized During Treatment:  (pushed IV pole) Nurse Communication: Mobility status (O2 left off for now, RN will spot check)  Activity Tolerance: Patient tolerated treatment well Patient left: in chair;with call bell/phone within reach;with chair alarm set   Time: 1017-1047 OT Time Calculation (min): 30 min Charges:  OT General Charges $OT Visit: 1 Procedure OT Evaluation $Initial OT Evaluation Tier I: 1 Procedure OT Treatments $Self Care/Home Management : 8-22 mins  Almon Register [phone] 04/28/2015, 10:54 AM

## 2015-04-29 ENCOUNTER — Inpatient Hospital Stay (HOSPITAL_COMMUNITY): Payer: Medicaid Other

## 2015-04-29 DIAGNOSIS — J189 Pneumonia, unspecified organism: Secondary | ICD-10-CM

## 2015-04-29 DIAGNOSIS — E43 Unspecified severe protein-calorie malnutrition: Secondary | ICD-10-CM

## 2015-04-29 DIAGNOSIS — K5669 Other intestinal obstruction: Secondary | ICD-10-CM

## 2015-04-29 LAB — GLUCOSE, CAPILLARY: GLUCOSE-CAPILLARY: 100 mg/dL — AB (ref 65–99)

## 2015-04-29 MED ORDER — LEVOFLOXACIN 500 MG PO TABS
500.0000 mg | ORAL_TABLET | Freq: Every day | ORAL | Status: DC
Start: 2015-04-29 — End: 2015-04-30
  Administered 2015-04-29 – 2015-04-30 (×2): 500 mg via ORAL
  Filled 2015-04-29 (×2): qty 1

## 2015-04-29 NOTE — Clinical Social Work Note (Signed)
Clinical Social Work Assessment  Patient Details  Name: Anthony Bolton MRN: [id number] Date of Birth: 16-Jul-1951  Date of referral:  04/29/15               Reason for consult:  Discharge Planning                Permission sought to share information with:    Permission granted to share information::  No  Name::        Agency::     Relationship::     Contact Information:     Housing/Transportation Living arrangements for the past 2 months:  Apartment Source of Information:  Patient Patient Interpreter Needed:  None Criminal Activity/Legal Involvement Pertinent to Current Situation/Hospitalization:  No - Comment as needed Significant Relationships:  None Lives with:  Self Do you feel safe going back to the place where you live?  No Need for family participation in patient care:  No (Coment)  Care giving concerns:  Patient reports he lives home alone and agreeable to SNF because he does not feel safe returning home without assistance.    Social Worker assessment / plan:  CSW received referral in order to assist with DC planning. CSW reviewed chart which stated PT recommends [place] vs SNF. Patient reports that he lives home alone but does not have adequate resources to care for himself. Patient reports he was fairly independent prior to admission but feels he will need placement at DC.  CSW provided SNF list and explained process. CSW explained that due to only having Medicaid that patient would have to stay at least 30 days at [facility] and that other counties besides [facility] would need to be searched. CSW completed FL2 and faxed out. CSW will follow up with bed offers.  Employment status:  Unemployed Forensic scientist:  Medicaid In [place] PT Recommendations:  Home with [place], [place] / Referral to community resources:  [place]  Patient/Family's Response to care:  Patient engaged during assessment but HOH.  Patient/Family's  Understanding of and Emotional Response to Diagnosis, Current Treatment, and Prognosis:  Patient reports poor home environment and does not feel he can fully heal without placement prior to returning home.  Emotional Assessment Appearance:  Appears stated age Attitude/Demeanor/Rapport:  Other (Cooperative) Affect (typically observed):  Appropriate Orientation:  Oriented to Self, Oriented to Place, Oriented to  Time, Oriented to Situation Alcohol / Substance use:  Not Applicable Psych involvement (Current and /or in the community):  No (Comment)  Discharge Needs  Concerns to be addressed:  No discharge needs identified Readmission within the last 30 days:  Yes Current discharge risk:  None Barriers to Discharge:  No Barriers Identified   Boone Master, [place] 04/29/2015, 11:17 AM [phone]

## 2015-04-29 NOTE — Progress Notes (Signed)
Physical Therapy Treatment Patient Details Name: Anthony Bolton MRN: [id number] DOB: [birth year] Today's Date: 04/29/2015    History of Present Illness Anthony Bolton is a 64 y.o. male adm with nausea, vomiting and abdominal pain;  PMHx: hyperlipidemia, COPD, severe hearing loss, systolic congestive heart failure (EF 20-25%), hyperlipidemia  .    PT Comments    Pt feeling better.  Assisted OOB to amb a great distance in hallway.  Mild dyspnea.  Slightly unsteady gait.  Impaired safety cognition and awareness to current situation.  Returned to bed per pt request.    Pt would benefit from a long term bed at ALF/group home setting .    Follow Up Recommendations  SNF (lives alone plus poor home situation)     Equipment Recommendations       Recommendations for Other Services       Precautions / Restrictions      Mobility  Bed Mobility Overal bed mobility: Modified Independent             General bed mobility comments: impulsive  Transfers Overall transfer level: Needs assistance Equipment used: None Transfers: Sit to/from Stand Sit to Stand: Supervision         General transfer comment: VC's for safety  Ambulation/Gait Ambulation/Gait assistance: Min guard Ambulation Distance (Feet): 55 Feet Assistive device: Rolling walker (2 wheeled) Gait Pattern/deviations: Step-through pattern;Decreased stride length;Trunk flexed     General Gait Details: mild SOB avg HR 88 and RA 96%.  Tolerated well.   Stairs            Wheelchair Mobility    Modified Rankin (Stroke Patients Only)       Balance                                    Cognition Arousal/Alertness: Awake/alert Behavior During Therapy: WFL for tasks assessed/performed Overall Cognitive Status: Within Functional Limits for tasks assessed                      Exercises      General Comments        Pertinent Vitals/Pain Pain Assessment: No/denies pain    Home  Living                      Prior Function            PT Goals (current goals can now be found in the care plan section) Progress towards PT goals: Progressing toward goals    Frequency  Min 3X/week    PT Plan      Co-evaluation             End of Session Equipment Utilized During Treatment: Gait belt Activity Tolerance: Patient tolerated treatment well Patient left: in bed;with call bell/phone within reach     Time: 1135-1148 PT Time Calculation (min) (ACUTE ONLY): 13 min  Charges:  $Gait Training: 8-22 mins                    G Codes:      Rica Koyanagi  PTA [facility]  Acute  Rehab Pager      [phone]

## 2015-04-29 NOTE — Progress Notes (Signed)
Subjective: He says he feels better and wants some real food.  +BM's he says are normal for him.  Objective: Vital signs in last 24 hours: Temp:  [97.6 F (36.4 C)-98.4 F (36.9 C)] 97.6 F (36.4 C) (08/01 0513) Pulse Rate:  [60-91] 60 (08/01 0513) Resp:  [17-18] 17 (08/01 0513) BP: (95-101)/(66-67) 101/67 mmHg (08/01 0513) SpO2:  [93 %-98 %] 96 % (08/01 0906) FiO2 (%):  [21 %] 21 % (08/01 0906) Weight:  [50.4 kg (111 lb 1.8 oz)] 50.4 kg (111 lb 1.8 oz) (08/01 0513) Last BM Date: 04/27/15 PO 2000 recorded  Diet:  clears BM x 4 Afebrile, VSS No labs today Film yesterday showed:  High-grade partial small bowel obstruction, with mild increase in degree of small bowel dilatation since prior study. Intake/Output from previous day: 07/31 0701 - 08/01 0700 In: 2837.5 [P.O.:2000; I.V.:487.5; IV Piggyback:350] Out: 433 [Urine:179; Emesis/NG output:250; Stool:4] Intake/Output this shift: Total I/O In: 240 [P.O.:240] Out: -   General appearance: alert, cooperative and no distress GI: soft, minimal distension, midline scar well healed.  Lab Results:   Recent Labs  04/26/15 1900 04/27/15 0213  WBC 11.6* 12.0*  HGB 16.0 15.5  HCT 47.9 44.4  PLT 143* 138*    BMET  Recent Labs  04/26/15 1900 04/27/15 0213  NA 135 135  K 4.1 4.0  CL 89* 94*  CO2 34* 30  GLUCOSE 119* 119*  BUN 44* 41*  CREATININE 0.97 0.77  CALCIUM 9.4 8.8*   PT/INR  Recent Labs  04/27/15  LABPROT 13.9  INR 1.05     Recent Labs Lab 04/26/15 1900 04/27/15 0213  AST 25 20  ALT 24 22  ALKPHOS 55 48  BILITOT 1.6* 1.3*  PROT 7.4 6.7  ALBUMIN 4.1 3.8     Lipase     Component Value Date/Time   LIPASE 26 04/26/2015 1900     Studies/Results: Dg Abd Portable 1v  04/28/2015   CLINICAL DATA:  Followup small bowel obstruction.  EXAM: PORTABLE ABDOMEN - 1 VIEW  COMPARISON:  04/27/2015  FINDINGS: Nasogastric tube tip remains in the proximal stomach. Markedly dilated small bowel loops in  the right abdomen and pelvis show mildly increased dilatation compared to previous study. There is some gas and stool remaining in the transverse and left colon. This is consistent with a high-grade partial small bowel obstruction.  IMPRESSION: High-grade partial small bowel obstruction, with mild increase in degree of small bowel dilatation since prior study.   Electronically Signed   By: Earle Gell M.D.   On: 04/28/2015 10:12    Medications: . antiseptic oral rinse  7 mL Mouth Rinse q12n4p  . aspirin EC  81 mg Oral Daily  . atorvastatin  40 mg Oral QHS  . budesonide-formoterol  2 puff Inhalation BID  . chlorhexidine  15 mL Mouth Rinse BID  . feeding supplement (ENSURE ENLIVE)  237 mL Oral BID BM  . guaiFENesin  1,200 mg Oral BID  . methylPREDNISolone (SOLU-MEDROL) injection  20 mg Intravenous Q24H  . nicotine  21 mg Transdermal Daily  . piperacillin-tazobactam (ZOSYN)  IV  3.375 g Intravenous Q8H  . sodium chloride  3 mL Intravenous Q12H  . tiotropium  18 mcg Inhalation Daily  . traZODone  50 mg Oral QHS    Assessment/Plan SBO Hx of appendectomy  Severe COPD -chronic prednisone treatment Tobacco use   Pneumonia  CHF (EF 20-25%) Malnutrition Stage 1 pressure ulcer Hard of hearing Antibiotics:  Day 3.5 Zosyn DVT:  SCD's only  Plan:  I will get a flat plate just to be sure dilatation documented yesterday is better and full liquids for lunch.       LOS: 3 days    Anthony Bolton 04/29/2015

## 2015-04-29 NOTE — Consult Note (Signed)
WOC wound consult note Reason for Consult:Stage 1 pressure injury noted yesterday; no non-blanchable erythema today. Wound type:Pressure Pressure Ulcer POA: Yes Drainage (amount, consistency, odor) None Dressing procedure/placement/frequency: Patient reports that he sleeps on a box spring at home and that a thin "camping-style" air mattress has had a hole in it for weeks.  He is thin and his skin is dry. He reports sleeping like a baby in our "real bed". We will provide a pressure redistribution chair pad for his OOB use in his next care setting. Topical skin care products will be used during his acute care admission. [place] nursing team will not follow, but will remain available to this patient, the nursing and medical team.  Please re-consult if needed. Thanks, Maudie Flakes, MSN, RN, [place], [place], [place] [phone])

## 2015-04-29 NOTE — Clinical Social Work Placement (Signed)
   CLINICAL SOCIAL WORK PLACEMENT  NOTE  Date:  04/29/2015  Patient Details  Name: Anthony Bolton MRN: [id number] Date of Birth: [birth year]  Clinical Social Work is seeking post-discharge placement for this patient at the [place] level of care (*CSW will initial, date and re-position this form in  chart as items are completed):  Yes   Patient/family provided with [place] Work Department's list of facilities offering this level of care within the geographic area requested by the patient (or if unable, by the patient's family).  Yes   Patient/family informed of their freedom to choose among providers that offer the needed level of care, that participate in Medicare, Medicaid or managed care program needed by the patient, have an available bed and are willing to accept the patient.  Yes   Patient/family informed of [place]'s ownership interest in [facility] and [facility], as well as of the fact that they are under no obligation to receive care at these facilities.  PASRR submitted to EDS on 04/29/15     PASRR number received on 04/29/15     Existing PASRR number confirmed on       FL2 transmitted to all facilities in geographic area requested by pt/family on 04/29/15     FL2 transmitted to all facilities within larger geographic area on       Patient informed that his/her managed care company has contracts with or will negotiate with certain facilities, including the following:            Patient/family informed of bed offers received.  Patient chooses bed at       Physician recommends and patient chooses bed at      Patient to be transferred to   on  .  Patient to be transferred to facility by       Patient family notified on   of transfer.  Name of family member notified:        PHYSICIAN       Additional Comment:    _______________________________________________ Boone Master, [place] 04/29/2015, 12:14 PM

## 2015-04-29 NOTE — Progress Notes (Signed)
[facility] PROGRESS NOTE  Assessment/Plan: SBO (small bowel obstruction) : - Ct abdomen showed High grade SBO. - surgery consulted recommended conservative manege ment - KVO IV fluids tolerating liq diet.  Lobar PNA: - started on iv zosyn de-ecalated to levaquin oberve for an additional 24 hrs.   COPD: - stable no changes made to meds  Tobacco abuse: - smoking cessation  Protein-calorie malnutrition, severe - Continue nutritional supplementation  Chronic systolic CHF - Last 2 D ECHO with EF of 25 % - Compensated  HLD (hyperlipidemia) - Continue statin therapy  Pressure ulcer, stage 1  - WOC consulted   Pressure ulcer   Code Status: full Family Communication: patient  Disposition Plan: inpatient   Consultants:  Surgery  Procedures:  CT abd and pelvis  Antibiotics:  Levaquin  HPI/Subjective: No complains feels better passing gas  Objective: Filed Vitals:   04/28/15 2039 04/28/15 2147 04/29/15 0513 04/29/15 0906  BP:  95/67 101/67   Pulse:  76 60   Temp:  98.4 F (36.9 C) 97.6 F (36.4 C)   TempSrc:  Oral Oral   Resp:  18 17   Height:      Weight:   50.4 kg (111 lb 1.8 oz)   SpO2: 95% 98% 98% 96%    Intake/Output Summary (Last 24 hours) at 04/29/15 1300 Last data filed at 04/29/15 0928  Gross per 24 hour  Intake   1870 ml  Output     10 ml  Net   1860 ml   Filed Weights   04/27/15 0542 04/28/15 0618 04/29/15 0513  Weight: 45.2 kg (99 lb 10.4 oz) 46.5 kg (102 lb 8.2 oz) 50.4 kg (111 lb 1.8 oz)    Exam:  General: Alert, awake, oriented x3, in no acute distress.  HEENT: No bruits, no goiter.  Heart: Regular rate and rhythm. Lungs: Good air movement, clear Abdomen: Soft, nontender, nondistended, positive bowel sounds.  Neuro: Grossly intact, nonfocal.   Data Reviewed: Basic Metabolic Panel:  Recent Labs Lab 04/26/15 1900 04/27/15 0213  NA 135 135  K 4.1 4.0  CL 89* 94*  CO2 34* 30  GLUCOSE 119* 119*  BUN 44*  41*  CREATININE 0.97 0.77  CALCIUM 9.4 8.8*   Liver Function Tests:  Recent Labs Lab 04/26/15 1900 04/27/15 0213  AST 25 20  ALT 24 22  ALKPHOS 55 48  BILITOT 1.6* 1.3*  PROT 7.4 6.7  ALBUMIN 4.1 3.8    Recent Labs Lab 04/26/15 1900  LIPASE 26   No results for input(s): AMMONIA in the last 168 hours. CBC:  Recent Labs Lab 04/26/15 1900 04/27/15 0213  WBC 11.6* 12.0*  NEUTROABS 8.7*  --   HGB 16.0 15.5  HCT 47.9 44.4  MCV 93.2 92.5  PLT 143* 138*   Cardiac Enzymes: No results for input(s): CKTOTAL, CKMB, CKMBINDEX, TROPONINI in the last 168 hours. BNP (last 3 results)  Recent Labs  03/25/15 1839 04/06/15 2101 04/27/15 0213  BNP 145.2* 67.9 28.1    ProBNP (last 3 results) No results for input(s): PROBNP in the last 8760 hours.  CBG:  Recent Labs Lab 04/27/15 0729 04/28/15 0734 04/29/15 0743  GLUCAP 144* 97 100*    Recent Results (from the past 240 hour(s))  Culture, blood (x 2)     Status: None (Preliminary result)   Collection Time: 04/27/15 12:00 AM  Result Value Ref Range Status   Specimen Description BLOOD LEFT ANTECUBITAL  Final   Special Requests BOTTLES DRAWN  AEROBIC ONLY 5CC  Final   Culture   Final    NO GROWTH 1 DAY Performed at [facility]    Report Status PENDING  Incomplete  Culture, blood (x 2)     Status: None (Preliminary result)   Collection Time: 04/27/15 12:00 AM  Result Value Ref Range Status   Specimen Description BLOOD BLOOD LEFT HAND  Final   Special Requests BOTTLES DRAWN AEROBIC ONLY 5ML  Final   Culture   Final    NO GROWTH 1 DAY Performed at [facility]    Report Status PENDING  Incomplete     Studies: Dg Abd 1 View  04/29/2015   CLINICAL DATA:  Small bowel obstruction.  Continued surveillance.  EXAM: ABDOMEN - 1 VIEW  COMPARISON:  04/28/2015.  FINDINGS: Nasogastric tube has been removed. The small bowel gaseous distention noted on yesterday's radiograph is diminished, and there is  increasing colonic gas suggesting resolving small bowel obstruction.  IMPRESSION: Improved bowel gas pattern.   Electronically Signed   By: Staci Righter M.D.   On: 04/29/2015 09:57   Dg Abd Portable 1v  04/28/2015   CLINICAL DATA:  Followup small bowel obstruction.  EXAM: PORTABLE ABDOMEN - 1 VIEW  COMPARISON:  04/27/2015  FINDINGS: Nasogastric tube tip remains in the proximal stomach. Markedly dilated small bowel loops in the right abdomen and pelvis show mildly increased dilatation compared to previous study. There is some gas and stool remaining in the transverse and left colon. This is consistent with a high-grade partial small bowel obstruction.  IMPRESSION: High-grade partial small bowel obstruction, with mild increase in degree of small bowel dilatation since prior study.   Electronically Signed   By: Earle Gell M.D.   On: 04/28/2015 10:12    Scheduled Meds: . antiseptic oral rinse  7 mL Mouth Rinse q12n4p  . aspirin EC  81 mg Oral Daily  . atorvastatin  40 mg Oral QHS  . budesonide-formoterol  2 puff Inhalation BID  . chlorhexidine  15 mL Mouth Rinse BID  . feeding supplement (ENSURE ENLIVE)  237 mL Oral BID BM  . guaiFENesin  1,200 mg Oral BID  . methylPREDNISolone (SOLU-MEDROL) injection  20 mg Intravenous Q24H  . nicotine  21 mg Transdermal Daily  . piperacillin-tazobactam (ZOSYN)  IV  3.375 g Intravenous Q8H  . sodium chloride  3 mL Intravenous Q12H  . tiotropium  18 mcg Inhalation Daily  . traZODone  50 mg Oral QHS   Continuous Infusions:   Time Spent: 25 min   Charlynne Cousins  [facility] Pager [phone]. If 7PM-7AM, please contact night-coverage at [email or web address], password [facility] 04/29/2015, 1:00 PM  LOS: 3 days

## 2015-04-30 DIAGNOSIS — Z720 Tobacco use: Secondary | ICD-10-CM

## 2015-04-30 DIAGNOSIS — L8990 Pressure ulcer of unspecified site, unspecified stage: Secondary | ICD-10-CM

## 2015-04-30 MED ORDER — LEVOFLOXACIN 500 MG PO TABS: 500.0000 mg | ORAL_TABLET | Freq: Every day | ORAL | Status: DC

## 2015-04-30 NOTE — Progress Notes (Addendum)
Occupational Therapy Treatment Patient Details Name: Anthony Bolton MRN: [id number] DOB: 1950/10/03 Today's Date: 04/30/2015    History of present illness Anthony Bolton is a 64 y.o. male adm with nausea, vomiting and abdominal pain;  PMHx: hyperlipidemia, COPD, severe hearing loss, systolic congestive heart failure (EF 20-25%), hyperlipidemia  .   OT comments  Pt demonstrates poor safety awareness and is very impulsive with activity. He pushed away the walker when offered by OT and states, "I dont need that." He also pushed away gait belt when OT attempted to use with pt. He needs a 24/7 supervised setting for safety at d/c. See social work notes. He would benefit from ALF type setting.     Follow Up Recommendations  Supervision/Assistance - 24 hour. SNF (pt would benefit from a 24/7 supervision environment for safety such as an ALF for long term. )    Equipment Recommendations  None recommended by OT    Recommendations for Other Services      Precautions / Restrictions Precautions Precautions: Fall Precaution Comments:  EF 20-25%       Mobility Bed Mobility Overal bed mobility: Modified Independent                Transfers Overall transfer level: Needs assistance Equipment used: None Transfers: Sit to/from Stand Sit to Stand: Supervision         General transfer comment: supervision for safety as pt moves quickly, impulsive.    Balance                                   ADL       Grooming: Standing;Wash/dry face;Supervision/safety                   Toilet Transfer: Min guard;Ambulation;Comfort height toilet   Toileting- Clothing Manipulation and Hygiene: Min guard;Sit to/from stand         General ADL Comments: Pt declined use of walker with OT today and when walker was placed in front of him, he pushed it aside and said, "I dont need that." Later when SCDs were placed back on pt, he reached and pulled them off and tossed them  aside. He did participate in grooming tasks and toilet transfer this session but declined to work on bathing but no reason specified. Just stated, "not now." When asked if he was comfortable in the bed at the end of the session, pt laughed and closed his eyes.      Vision                     Perception     Praxis      Cognition   Behavior During Therapy: Agitated;Impulsive Overall Cognitive Status: No family/caregiver present to determine baseline cognitive functioning Area of Impairment: Safety/judgement          Safety/Judgement: Decreased awareness of safety          Extremity/Trunk Assessment               Exercises     Shoulder Instructions       General Comments      Pertinent Vitals/ Pain       Pain Assessment: No/denies pain  Home Living  Prior Functioning/Environment              Frequency Min 2X/week     Progress Toward Goals  OT Goals(current goals can now be found in the care plan section)  Progress towards OT goals: Progressing toward goals     Plan Discharge plan needs to be updated    Co-evaluation                 End of Session     Activity Tolerance Patient tolerated treatment well   Patient Left in bed;with call bell/phone within reach   Nurse Communication          Time: 2122-4825 OT Time Calculation (min): 15 min  Charges: OT General Charges $OT Visit: 1 Procedure OT Treatments $Self Care/Home Management : 8-22 mins  Jules Schick  [phone] 04/30/2015, 10:00 AM

## 2015-04-30 NOTE — Discharge Summary (Signed)
Physician Discharge Summary  Anthony Bolton EEF:[id number] DOB: [birth year] DOA: 04/26/2015  PCP: [name], MD  Admit date: 04/26/2015 Discharge date: 04/30/2015  Time spent: 35 minutes  Recommendations for Outpatient Follow-up:  1. We transferred to skilled nursing facility. 2. We'll continue prednisone and Levaquin for 4 additional days.  Discharge Diagnoses:  Principal Problem:   SBO (small bowel obstruction) Active Problems:   COPD (chronic obstructive pulmonary disease)   Tobacco abuse   Protein-calorie malnutrition, severe   Systolic CHF   HLD (hyperlipidemia)   SOB (shortness of breath)   Pressure ulcer   Discharge Condition: stable  Diet recommendation: regular  Filed Weights   04/28/15 0618 04/29/15 0513 04/30/15 0516  Weight: 46.5 kg (102 lb 8.2 oz) 50.4 kg (111 lb 1.8 oz) 47.2 kg (104 lb 0.9 oz)    History of present illness:  64 y.o. male with PMH of hyperlipidemia COPD, severe hearing loss, systolic congestive heart failure (EF 20-25%), hyperlipidemia, who presents with nausea, vomiting and abdominal pain.  He reports that in the past 7 days, he has been having nausea, vomiting and abdominal pain. His abdominal pain is diffuse pain, constant, nonradiating. It is not aggravated or alleviated by any known factors. He has vomited more than 10 times each day. He does not have diarrhea. No fever or chills. Patient has mild cough and shortness of breath due to COPD, which has not changed from her baseline. Patient does not have chest pain, rashes, unilateral weakness.  Hospital Course:  Small bowel obstruction: With a CT scan of the abdomen and pelvis that showed a high-grade small bowel obstruction. Surgery was consulted who recommended conservative management. After a couple of days he started having bowel movements and passing gas or diet was advanced which she tolerated.  Lobar community-acquired pneumonia: He was started on IV Zosyn which she tolerated well  he defervesced. He was changed to Levaquin which she will continue for 4 additional days.  Tobacco abuse: Counseling was performed.  Severe protein caloric malnutrition: He will continue ensure 3 times a day.  Chronic systolic heart failure: Seems to be compensated no changes were made.  Pressure ulcer stage I: Continue wound care.   Procedures:  CT abd and pelvis  Consultations:  surgery  Discharge Exam: Filed Vitals:   04/30/15 0516  BP: 91/61  Pulse: 73  Temp: 98.3 F (36.8 C)  Resp: 18    General: A&O x3 Cardiovascular: RRR Respiratory: good air movement CTA B/L  Discharge Instructions   Discharge Instructions    Diet - low sodium heart healthy    Complete by:  As directed      Increase activity slowly    Complete by:  As directed           Current Discharge Medication List    START taking these medications   Details  levofloxacin (LEVAQUIN) 500 MG tablet Take 1 tablet (500 mg total) by mouth daily. Qty: 4 tablet, Refills: 0      CONTINUE these medications which have NOT CHANGED   Details  atorvastatin (LIPITOR) 40 MG tablet Take 1 tablet (40 mg total) by mouth at bedtime. Qty: 30 tablet, Refills: 0    [employer] SLEEP AID 5 MG TABS TAKE 2 PILLS BY MOUTH 30 MIN BEFORE BED TIME. Refills: 4    ipratropium-albuterol (DUONEB) 0.5-2.5 (3) MG/3ML SOLN Take 3 mLs by nebulization every 6 (six) hours. Qty: 360 mL, Refills: 2    PROAIR HFA 108 (90 BASE) MCG/ACT inhaler INHALE  2 PUFFS EVERY 4 HOURS AS NEEDED FOR WHEEZING OR SHORTNESS OF BREATH Refills: 4    SPIRIVA HANDIHALER 18 MCG inhalation capsule INHALE CONTENTS OF 1 CAPSULE USING HANDIHALER EVERY DAY FOR BREATHING Refills: 4    traZODone (DESYREL) 50 MG tablet Take 50 mg by mouth at bedtime.    albuterol (PROVENTIL) (2.5 MG/3ML) 0.083% nebulizer solution Take 3 mLs (2.5 mg total) by nebulization every 2 (two) hours as needed for wheezing or shortness of breath. Qty: 75 mL, Refills: 2    aspirin EC  81 MG EC tablet Take 1 tablet (81 mg total) by mouth daily. Qty: 30 tablet, Refills: 0    budesonide-formoterol (SYMBICORT) 160-4.5 MCG/ACT inhaler Inhale 2 puffs into the lungs 2 (two) times daily.    feeding supplement, ENSURE ENLIVE, (ENSURE ENLIVE) LIQD Take 237 mLs by mouth 2 (two) times daily between meals. Qty: 237 mL, Refills: 12    guaiFENesin (MUCINEX) 600 MG 12 hr tablet Take 2 tablets (1,200 mg total) by mouth 2 (two) times daily. Qty: 30 tablet, Refills: 0    Multiple Vitamin (MULTIVITAMIN WITH MINERALS) TABS tablet Take 1 tablet by mouth daily. Qty: 30 tablet, Refills: 0    predniSONE (DELTASONE) 10 MG tablet Take 1 tablet (10 mg total) by mouth daily with breakfast. Prednisone dosing: Take  Prednisone 40mg  (4 tabs) x 3 days, then taper to 30mg  (3 tabs) x 3 days, then 20mg  (2 tabs) x 3days, then 10mg  (1 tab) x 3days, then OFF. Qty: 30 tablet, Refills: 0      STOP taking these medications     PULMICORT 0.25 MG/2ML nebulizer solution      budesonide (PULMICORT) 0.5 MG/2ML nebulizer solution      doxycycline (VIBRA-TABS) 100 MG tablet        No Known Allergies    The results of significant diagnostics from this hospitalization (including imaging, microbiology, ancillary and laboratory) are listed below for reference.    Significant Diagnostic Studies: Ct Abdomen Pelvis Wo Contrast  04/26/2015   CLINICAL DATA:  Left abdominal pain for 9 days.  Nausea, vomiting.  EXAM: CT ABDOMEN AND PELVIS WITHOUT CONTRAST  TECHNIQUE: Multidetector CT imaging of the abdomen and pelvis was performed following the standard protocol without IV contrast.  COMPARISON:  None.  FINDINGS: Lung bases are clear.  No effusions.  Heart is normal size.  Stomach and the proximal to mid small bowel loops are markedly dilated compatible with high grade small bowel obstruction. Exact transition is not identified. Mid to distal small bowel loops and colon are decompressed. Moderate stool burden throughout  the colon. No free fluid, free air or adenopathy.  Liver, gallbladder, spleen, pancreas, right adrenal and kidneys are unremarkable. Heterogeneous mass within the left adrenal gland measures 4.2 cm, not significantly changed since prior MRI when this was felt to most likely reflect partially necrotic at adenoma.  Aortic and iliac calcifications. No aneurysm. No acute bony abnormality or focal bone lesion.  IMPRESSION: Markedly dilated stomach and proximal to mid small bowel loops compatible with high-grade partial small bowel obstruction. Exact transition point or cause not identified.  Moderate stool burden throughout the colon.  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   Electronically Signed   By: Rolm Baptise M.D.   On: 04/26/2015 20:45   Dg Chest 2 View  04/26/2015   CLINICAL DATA:  Left-sided abdominal pain 9 days with nausea and vomiting.  EXAM: CHEST  2 VIEW  COMPARISON:  04/06/2015  FINDINGS: Lungs are adequately inflated with mild patchy opacification in the medial right base as this may be due to atelectasis or early infection. No evidence of effusion. Cardiomediastinal silhouette and remainder of the exam is unchanged.  IMPRESSION: Mild patchy opacification in the medial right base which may be due to atelectasis or early infection.   Electronically Signed   By: Marin Olp M.D.   On: 04/26/2015 20:35   Dg Chest 2 View  04/06/2015   CLINICAL DATA:  Chest pain.  EXAM: CHEST  2 VIEW  COMPARISON:  April 01, 2015.  FINDINGS: The heart size and mediastinal contours are within normal limits. Both lungs are clear. No pneumothorax or pleural effusion is noted. Hyperexpansion of the lungs is noted consistent with chronic obstructive pulmonary disease. Old distal left clavicular fracture is noted.  IMPRESSION: Hyperexpansion of the lungs consistent with chronic obstructive pulmonary disease. No acute cardiopulmonary abnormality seen.   Electronically Signed   By: Marijo Conception, M.D.    On: 04/06/2015 21:00   Dg Chest 2 View  04/01/2015   CLINICAL DATA:  Left chest pain today.  EXAM: CHEST  2 VIEW  COMPARISON:  March 25, 2015  FINDINGS: The heart size and mediastinal contours are within normal limits. The lungs are hyperinflated. Bilateral nipple shadows are identified unchanged. There is no focal infiltrate, pulmonary edema, or pleural effusion. The visualized skeletal structures are stable.  IMPRESSION: No active cardiopulmonary disease.  Emphysema.   Electronically Signed   By: Abelardo Diesel M.D.   On: 04/01/2015 21:33   Dg Abd 1 View  04/29/2015   CLINICAL DATA:  Small bowel obstruction.  Continued surveillance.  EXAM: ABDOMEN - 1 VIEW  COMPARISON:  04/28/2015.  FINDINGS: Nasogastric tube has been removed. The small bowel gaseous distention noted on yesterday's radiograph is diminished, and there is increasing colonic gas suggesting resolving small bowel obstruction.  IMPRESSION: Improved bowel gas pattern.   Electronically Signed   By: Staci Righter M.D.   On: 04/29/2015 09:57   Dg Abd 2 Views  04/27/2015   CLINICAL DATA:  Abdominal pain, nausea/vomiting, follow-up small bowel obstruction  EXAM: ABDOMEN - 2 VIEW  COMPARISON:  CT abdomen pelvis dated 04/26/2015  FINDINGS: Multiple dilated loops of small bowel throughout the abdomen, compatible with small bowel obstruction.  No evidence of free air on the lateral decubitus view.  Visualized osseous structures are within normal limits.  IMPRESSION: Multiple dilated loops of small bowel throughout the abdomen, compatible small bowel obstruction.  No free air.   Electronically Signed   By: Julian Hy M.D.   On: 04/27/2015 09:17   Dg Abd Portable 1v  04/28/2015   CLINICAL DATA:  Followup small bowel obstruction.  EXAM: PORTABLE ABDOMEN - 1 VIEW  COMPARISON:  04/27/2015  FINDINGS: Nasogastric tube tip remains in the proximal stomach. Markedly dilated small bowel loops in the right abdomen and pelvis show mildly increased dilatation  compared to previous study. There is some gas and stool remaining in the transverse and left colon. This is consistent with a high-grade partial small bowel obstruction.  IMPRESSION: High-grade partial small bowel obstruction, with mild increase in degree of small bowel dilatation since prior study.   Electronically Signed   By: Earle Gell M.D.   On: 04/28/2015 10:12    Microbiology: Recent Results (from the past 240 hour(s))  Culture, blood (x 2)     Status: None (Preliminary result)   Collection Time: 04/27/15 12:00 AM  Result  Value Ref Range Status   Specimen Description BLOOD LEFT ANTECUBITAL  Final   Special Requests BOTTLES DRAWN AEROBIC ONLY 5CC  Final   Culture   Final    NO GROWTH 2 DAYS Performed at [facility]    Report Status PENDING  Incomplete  Culture, blood (x 2)     Status: None (Preliminary result)   Collection Time: 04/27/15 12:00 AM  Result Value Ref Range Status   Specimen Description BLOOD BLOOD LEFT HAND  Final   Special Requests BOTTLES DRAWN AEROBIC ONLY 5ML  Final   Culture   Final    NO GROWTH 2 DAYS Performed at [facility]    Report Status PENDING  Incomplete     Labs: Basic Metabolic Panel:  Recent Labs Lab 04/26/15 1900 04/27/15 0213  NA 135 135  K 4.1 4.0  CL 89* 94*  CO2 34* 30  GLUCOSE 119* 119*  BUN 44* 41*  CREATININE 0.97 0.77  CALCIUM 9.4 8.8*   Liver Function Tests:  Recent Labs Lab 04/26/15 1900 04/27/15 0213  AST 25 20  ALT 24 22  ALKPHOS 55 48  BILITOT 1.6* 1.3*  PROT 7.4 6.7  ALBUMIN 4.1 3.8    Recent Labs Lab 04/26/15 1900  LIPASE 26   No results for input(s): AMMONIA in the last 168 hours. CBC:  Recent Labs Lab 04/26/15 1900 04/27/15 0213  WBC 11.6* 12.0*  NEUTROABS 8.7*  --   HGB 16.0 15.5  HCT 47.9 44.4  MCV 93.2 92.5  PLT 143* 138*   Cardiac Enzymes: No results for input(s): CKTOTAL, CKMB, CKMBINDEX, TROPONINI in the last 168 hours. BNP: BNP (last 3 results)  Recent Labs   03/25/15 1839 04/06/15 2101 04/27/15 0213  BNP 145.2* 67.9 28.1    ProBNP (last 3 results) No results for input(s): PROBNP in the last 8760 hours.  CBG:  Recent Labs Lab 04/27/15 0729 04/28/15 0734 04/29/15 0743  GLUCAP 144* 97 100*       Signed:  Charlynne Cousins  [facility] 04/30/2015, 10:19 AM

## 2015-04-30 NOTE — Progress Notes (Signed)
Clinical Social Work  CSW met with patient at bedside to discuss DC plans. On 8/1 patient was agreeable to SNF placement. CSW went to room to offer SNF bed choices. Patient reports he has changed his mind and no longer wants to go to SNF. Patient reports he will lose his belongings and does not want to stay at SNF for 30 days which is required under Medicaid coverage. CSW asked patient if CSW could call friends or family who could help with getting belongings to SNF so he would not lose his belongings.  Patient became angry with CSW and yelled, "You are not listening me. Can you not hear me? I told you I wasn't going to go. I just want to go home." Patient reports he needs a bus pass to get home but refusing placement.  CSW alerted MD of patient's plans to DC home. CSW is signing off but available if needed.  [place], [place] [phone]

## 2015-05-02 ENCOUNTER — Inpatient Hospital Stay (HOSPITAL_COMMUNITY)
Admission: EM | Admit: 2015-05-02 | Discharge: 2015-05-04 | DRG: 388 | Disposition: A | Discharge status: Home / Self Care | Payer: Medicaid Other | Attending: Internal Medicine | Admitting: Internal Medicine

## 2015-05-02 ENCOUNTER — Encounter (HOSPITAL_COMMUNITY): Payer: Self-pay | Admitting: Emergency Medicine

## 2015-05-02 ENCOUNTER — Emergency Department (HOSPITAL_COMMUNITY): Payer: Medicaid Other

## 2015-05-02 DIAGNOSIS — F17210 Nicotine dependence, cigarettes, uncomplicated: Secondary | ICD-10-CM | POA: Diagnosis present

## 2015-05-02 DIAGNOSIS — K56609 Unspecified intestinal obstruction, unspecified as to partial versus complete obstruction: Secondary | ICD-10-CM | POA: Diagnosis present

## 2015-05-02 DIAGNOSIS — K5669 Other intestinal obstruction: Secondary | ICD-10-CM | POA: Diagnosis not present

## 2015-05-02 DIAGNOSIS — J449 Chronic obstructive pulmonary disease, unspecified: Secondary | ICD-10-CM | POA: Diagnosis present

## 2015-05-02 DIAGNOSIS — I5022 Chronic systolic (congestive) heart failure: Secondary | ICD-10-CM | POA: Diagnosis not present

## 2015-05-02 DIAGNOSIS — H9190 Unspecified hearing loss, unspecified ear: Secondary | ICD-10-CM

## 2015-05-02 DIAGNOSIS — Z7952 Long term (current) use of systemic steroids: Secondary | ICD-10-CM

## 2015-05-02 DIAGNOSIS — Z9049 Acquired absence of other specified parts of digestive tract: Secondary | ICD-10-CM | POA: Diagnosis present

## 2015-05-02 DIAGNOSIS — Z823 Family history of stroke: Secondary | ICD-10-CM

## 2015-05-02 DIAGNOSIS — Z7982 Long term (current) use of aspirin: Secondary | ICD-10-CM

## 2015-05-02 DIAGNOSIS — Z955 Presence of coronary angioplasty implant and graft: Secondary | ICD-10-CM

## 2015-05-02 DIAGNOSIS — E785 Hyperlipidemia, unspecified: Secondary | ICD-10-CM | POA: Diagnosis present

## 2015-05-02 DIAGNOSIS — K5660 Unspecified intestinal obstruction: Principal | ICD-10-CM | POA: Diagnosis present

## 2015-05-02 DIAGNOSIS — E43 Unspecified severe protein-calorie malnutrition: Secondary | ICD-10-CM | POA: Diagnosis present

## 2015-05-02 DIAGNOSIS — Z681 Body mass index (BMI) 19 or less, adult: Secondary | ICD-10-CM

## 2015-05-02 DIAGNOSIS — J439 Emphysema, unspecified: Secondary | ICD-10-CM | POA: Diagnosis not present

## 2015-05-02 DIAGNOSIS — E279 Disorder of adrenal gland, unspecified: Secondary | ICD-10-CM | POA: Diagnosis present

## 2015-05-02 DIAGNOSIS — Z79899 Other long term (current) drug therapy: Secondary | ICD-10-CM

## 2015-05-02 DIAGNOSIS — Z8701 Personal history of pneumonia (recurrent): Secondary | ICD-10-CM

## 2015-05-02 DIAGNOSIS — L8991 Pressure ulcer of unspecified site, stage 1: Secondary | ICD-10-CM | POA: Diagnosis present

## 2015-05-02 LAB — CULTURE, BLOOD (ROUTINE X 2)
CULTURE: NO GROWTH
Culture: NO GROWTH

## 2015-05-02 LAB — URINALYSIS, ROUTINE W REFLEX MICROSCOPIC
Bilirubin Urine: NEGATIVE
Glucose, UA: NEGATIVE mg/dL
Hgb urine dipstick: NEGATIVE
Ketones, ur: NEGATIVE mg/dL
LEUKOCYTES UA: NEGATIVE
NITRITE: NEGATIVE
PROTEIN: NEGATIVE mg/dL
SPECIFIC GRAVITY, URINE: 1.014 (ref 1.005–1.030)
Urobilinogen, UA: 0.2 mg/dL (ref 0.0–1.0)
pH: 6 (ref 5.0–8.0)

## 2015-05-02 LAB — COMPREHENSIVE METABOLIC PANEL
ALBUMIN: 3.2 g/dL — AB (ref 3.5–5.0)
ALK PHOS: 42 U/L (ref 38–126)
ALT: 25 U/L (ref 17–63)
ANION GAP: 7 (ref 5–15)
AST: 22 U/L (ref 15–41)
BILIRUBIN TOTAL: 0.4 mg/dL (ref 0.3–1.2)
BUN: 13 mg/dL (ref 6–20)
CALCIUM: 8.5 mg/dL — AB (ref 8.9–10.3)
CO2: 27 mmol/L (ref 22–32)
CREATININE: 0.62 mg/dL (ref 0.61–1.24)
Chloride: 102 mmol/L (ref 101–111)
GFR calc Af Amer: >60 mL/min (ref >60)
GFR calc non Af Amer: >60 mL/min (ref >60)
GLUCOSE: 104 mg/dL — AB (ref 65–99)
Potassium: 3.8 mmol/L (ref 3.5–5.1)
SODIUM: 136 mmol/L (ref 135–145)
Total Protein: 5.5 g/dL — ABNORMAL LOW (ref 6.5–8.1)

## 2015-05-02 LAB — CBC WITH DIFFERENTIAL/PLATELET
Basophils Absolute: 0 10*3/uL (ref 0.0–0.1)
Basophils Relative: 0 % (ref 0–1)
Eosinophils Absolute: 0.3 10*3/uL (ref 0.0–0.7)
Eosinophils Relative: 2 % (ref 0–5)
HCT: 38.9 % — ABNORMAL LOW (ref 39.0–52.0)
HEMOGLOBIN: 12.8 g/dL — AB (ref 13.0–17.0)
LYMPHS ABS: 1.4 10*3/uL (ref 0.7–4.0)
Lymphocytes Relative: 10 % — ABNORMAL LOW (ref 12–46)
MCH: 31.3 pg (ref 26.0–34.0)
MCHC: 32.9 g/dL (ref 30.0–36.0)
MCV: 95.1 fL (ref 78.0–100.0)
MONO ABS: 0.9 10*3/uL (ref 0.1–1.0)
MONOS PCT: 6 % (ref 3–12)
Neutro Abs: 11.4 10*3/uL — ABNORMAL HIGH (ref 1.7–7.7)
Neutrophils Relative %: 82 % — ABNORMAL HIGH (ref 43–77)
PLATELETS: 181 10*3/uL (ref 150–400)
RBC: 4.09 MIL/uL — ABNORMAL LOW (ref 4.22–5.81)
RDW: 13.1 % (ref 11.5–15.5)
WBC: 14 10*3/uL — AB (ref 4.0–10.5)

## 2015-05-02 LAB — LIPASE, BLOOD: Lipase: 24 U/L (ref 22–51)

## 2015-05-02 MED ORDER — LEVOFLOXACIN IN D5W 750 MG/150ML IV SOLN
750.0000 mg | INTRAVENOUS | Status: AC
  Administered 2015-05-02 – 2015-05-03 (×2): 750 mg via INTRAVENOUS
  Filled 2015-05-02 (×2): qty 150

## 2015-05-02 MED ORDER — KCL IN DEXTROSE-NACL 20-5-0.9 MEQ/L-%-% IV SOLN
INTRAVENOUS | Status: DC
  Administered 2015-05-02 – 2015-05-03 (×2): via INTRAVENOUS
  Filled 2015-05-02 (×4): qty 1000

## 2015-05-02 MED ORDER — GI COCKTAIL ~~LOC~~
30.0000 mL | Freq: Once | ORAL | Status: AC
  Administered 2015-05-02: 30 mL via ORAL
  Filled 2015-05-02: qty 30

## 2015-05-02 MED ORDER — ALBUTEROL SULFATE (2.5 MG/3ML) 0.083% IN NEBU: 2.5000 mg | INHALATION_SOLUTION | RESPIRATORY_TRACT | Status: DC | PRN

## 2015-05-02 MED ORDER — BUDESONIDE-FORMOTEROL FUMARATE 160-4.5 MCG/ACT IN AERO
2.0000 | INHALATION_SPRAY | Freq: Two times a day (BID) | RESPIRATORY_TRACT | Status: DC
  Administered 2015-05-02 – 2015-05-04 (×4): 2 via RESPIRATORY_TRACT
  Filled 2015-05-02: qty 6

## 2015-05-02 MED ORDER — TIOTROPIUM BROMIDE MONOHYDRATE 18 MCG IN CAPS
18.0000 ug | ORAL_CAPSULE | Freq: Every day | RESPIRATORY_TRACT | Status: DC
  Administered 2015-05-03 – 2015-05-04 (×2): 18 ug via RESPIRATORY_TRACT
  Filled 2015-05-02: qty 5

## 2015-05-02 MED ORDER — IPRATROPIUM-ALBUTEROL 0.5-2.5 (3) MG/3ML IN SOLN
3.0000 mL | Freq: Four times a day (QID) | RESPIRATORY_TRACT | Status: DC
  Administered 2015-05-02 – 2015-05-04 (×7): 3 mL via RESPIRATORY_TRACT
  Filled 2015-05-02 (×7): qty 3

## 2015-05-02 MED ORDER — ALBUTEROL SULFATE HFA 108 (90 BASE) MCG/ACT IN AERS: 1.0000 | INHALATION_SPRAY | RESPIRATORY_TRACT | Status: DC | PRN

## 2015-05-02 MED ORDER — IOHEXOL 300 MG/ML  SOLN
50.0000 mL | Freq: Once | INTRAMUSCULAR | Status: AC | PRN
  Administered 2015-05-02: 50 mL via ORAL

## 2015-05-02 MED ORDER — TRAZODONE HCL 50 MG PO TABS
50.0000 mg | ORAL_TABLET | Freq: Every evening | ORAL | Status: DC | PRN
  Administered 2015-05-02: 50 mg via ORAL
  Filled 2015-05-02: qty 1

## 2015-05-02 MED ORDER — ENOXAPARIN SODIUM 40 MG/0.4ML ~~LOC~~ SOLN
40.0000 mg | SUBCUTANEOUS | Status: DC
  Administered 2015-05-02 – 2015-05-03 (×2): 40 mg via SUBCUTANEOUS
  Filled 2015-05-02 (×2): qty 0.4

## 2015-05-02 MED ORDER — IOHEXOL 300 MG/ML  SOLN
100.0000 mL | Freq: Once | INTRAMUSCULAR | Status: AC | PRN
  Administered 2015-05-02: 80 mL via INTRAVENOUS

## 2015-05-02 NOTE — ED Notes (Signed)
Bed: FF63 Expected date:  Expected time:  Means of arrival:  Comments: EMS- Constipation

## 2015-05-02 NOTE — Consult Note (Signed)
Reason for Consult:  SBO Referring Physician: Dr. Gloriann Loan ED  Anthony Bolton is an 64 y.o. male.  HPI: 51 y/0 male hospitalized 04/26/15-04/30/15 with SBO.  Pt pulled the NG out on 7/31, and had a bowel movement.  He had no nausea and was started on clears. He tolerated the NG out and clears. Repeat film on 8/1 showed improving bowel gas pattern, but he was still very distended, fair amount of stool in colon also. He was seen by Dr. Hassell Done who said he could go on full liquids and advance as tolerated.  He was also being treated with  Zosyn for lobar pneumonia.  He has a stage 1 pressure sore seen by Wound care.  He was discharged later on 04/30/15 by Medicine with initial plans for him to go to SNF which he refused on discharge.  Pt returns today reporting no BM since discharge, abdominal pain and anorexia.  He is afebrile, VSS.  WBC is up to 14K, CMP OK.  Repeat CT scan today shows: left adrenal mass, seen previously on MRI and felt to be benign.  Stomach is normal, with improvement of the small bowel loops, some contrast in loops, but ongoing dilatation without a point of obstruction seen.   Colonic loops are distended with stool, and significant stool burden.  We are ask to see. He is mildly distended, no nausea, no Bm since discharge, intermittent tenderness.  Few high pitched bowel sounds.  We are ask to see.  He may or may not stay in the hospital, angry and wants to home and have a cigarette, and eat. Doesn't seem to remember issues bringing him to the hospital, came by EMS.  Past Medical History  Diagnosis Date  COPD (chronic obstructive pulmonary disease)/chronic prednisone treatment. Multiple visits for COPD/Chest pain Ongoing tobacco use   CHF (systolic congestive heart failure)  EF 20-25%   Malnutrition   HOH (hard of hearing)   HLD (hyperlipidemia)     Past Surgical History  Procedure Laterality Date  . Appendectomy    . Left heart catheterization with coronary angiogram N/A  11/07/2013    Procedure: LEFT HEART CATHETERIZATION WITH CORONARY ANGIOGRAM;  Surgeon: Clent Demark, MD;  Location: [facility] CATH LAB;  Service: Cardiovascular;  Laterality: N/A;  . Cardiac stents      No stent history    Family History  Problem Relation Age of Onset  . Stroke Mother     Social History:  reports that he has been smoking Cigarettes.  He has never used smokeless tobacco. He reports that he does not drink alcohol or use illicit drugs.  Allergies: No Known Allergies  Medications:  Prior to Admission:  (Not in a hospital admission) Scheduled: Continuous: PRN: Anti-infectives    None      Results for orders placed or performed during the hospital encounter of 05/02/15 (from the past 48 hour(s))  Comprehensive metabolic panel     Status: Abnormal   Collection Time: 05/02/15 10:56 AM  Result Value Ref Range   Sodium 136 135 - 145 mmol/L   Potassium 3.8 3.5 - 5.1 mmol/L   Chloride 102 101 - 111 mmol/L   CO2 27 22 - 32 mmol/L   Glucose, Bld 104 (H) 65 - 99 mg/dL   BUN 13 6 - 20 mg/dL   Creatinine, Ser 0.62 0.61 - 1.24 mg/dL   Calcium 8.5 (L) 8.9 - 10.3 mg/dL   Total Protein 5.5 (L) 6.5 - 8.1 g/dL   Albumin  3.2 (L) 3.5 - 5.0 g/dL   AST 22 15 - 41 U/L   ALT 25 17 - 63 U/L   Alkaline Phosphatase 42 38 - 126 U/L   Total Bilirubin 0.4 0.3 - 1.2 mg/dL   GFR calc non Af Amer >60 >60 mL/min   GFR calc Af Amer >60 >60 mL/min    Comment: (NOTE) The eGFR has been calculated using the CKD EPI equation. This calculation has not been validated in all clinical situations. eGFR's persistently <60 mL/min signify possible Chronic Kidney Disease.    Anion gap 7 5 - 15  Lipase, blood     Status: None   Collection Time: 05/02/15 10:56 AM  Result Value Ref Range   Lipase 24 22 - 51 U/L  CBC with Differential     Status: Abnormal   Collection Time: 05/02/15 10:56 AM  Result Value Ref Range   WBC 14.0 (H) 4.0 - 10.5 K/uL   RBC 4.09 (L) 4.22 - 5.81 MIL/uL   Hemoglobin 12.8 (L)  13.0 - 17.0 g/dL   HCT 38.9 (L) 39.0 - 52.0 %   MCV 95.1 78.0 - 100.0 fL   MCH 31.3 26.0 - 34.0 pg   MCHC 32.9 30.0 - 36.0 g/dL   RDW 13.1 11.5 - 15.5 %   Platelets 181 150 - 400 K/uL   Neutrophils Relative % 82 (H) 43 - 77 %   Neutro Abs 11.4 (H) 1.7 - 7.7 K/uL   Lymphocytes Relative 10 (L) 12 - 46 %   Lymphs Abs 1.4 0.7 - 4.0 K/uL   Monocytes Relative 6 3 - 12 %   Monocytes Absolute 0.9 0.1 - 1.0 K/uL   Eosinophils Relative 2 0 - 5 %   Eosinophils Absolute 0.3 0.0 - 0.7 K/uL   Basophils Relative 0 0 - 1 %   Basophils Absolute 0.0 0.0 - 0.1 K/uL    Ct Abdomen Pelvis W Contrast  05/02/2015   CLINICAL DATA:  Complaint of right lower abdominal pain. The patient was seen on Friday in discharged on Sunday. No bowel movement since admission. Pain began yesterday. Previous appendectomy.  EXAM: CT ABDOMEN AND PELVIS WITH CONTRAST  TECHNIQUE: Multidetector CT imaging of the abdomen and pelvis was performed using the standard protocol following bolus administration of intravenous contrast.  CONTRAST:  52m OMNIPAQUE IOHEXOL 300 MG/ML  SOLN  COMPARISON:  05/02/2015 and 04/26/2015  FINDINGS: Lower chest: Heart size is normal. No imaged pericardial effusion or significant coronary artery calcifications. No pulmonary nodules, pleural effusions, or infiltrates.  Upper abdomen: No focal abnormality identified within the liver, spleen, pancreas, or right adrenal gland. Left adrenal mass is 4.2 cm and consistent with benign adenoma based on previous MRI characterization. Small left renal cyst in the midpole region. There is symmetric bilateral renal excretion. The gallbladder is present and is contracted.  Gastrointestinal tract: The stomach has a normal appearance. There has been significant improvement in the appearance of numerous dilated loops of small bowel. Some of the contrast filled loops continued to be somewhat dilated without in discrete transition zone or mass identified. Colonic loops are distended  with stool. There are scattered colonic diverticula. The rectosigmoid colon shows mildly thickened wall, nonspecific in appearance.  Pelvis: There is a small amount of free pelvic fluid. Urinary bladder is distended. Prostate gland in is prominent in size.  Retroperitoneum: Atherosclerotic calcification and mural thrombus involving the abdominal aorta and iliac arteries.  Abdominal wall: Unremarkable.  Osseous structures: No suspicious lytic or  blastic lesions are identified. Mild spondylosis in the lower lumbar spine.  IMPRESSION: 1. There has been significant improvement in the dilatation of small bowel loops. However there is persistent significant small bowel dilatation without point of obstruction or mass identified. 2. Small amount of free pelvic fluid. 3. Significant stool burden. 4. Nonspecific thickening of the sigmoid colon and rectum, not associated with inflammation. 5. No free intraperitoneal air. 6. Stable left adrenal adenoma. 7. Atherosclerosis of the abdominal aorta.   Electronically Signed   By: Nolon Nations M.D.   On: 05/02/2015 13:02   Dg Abd Acute W/chest  05/02/2015   CLINICAL DATA:  One day history of lower abdominal pain with nausea and shortness of breath  EXAM: DG ABDOMEN ACUTE W/ 1V CHEST  COMPARISON:  Abdomen series April 26, 2015; supine abdomen April 29, 2015  FINDINGS: PA chest: Lungs are hyperexpanded. There is patchy scarring in the bases. Currently no edema or consolidation. The heart size is normal. There is diminished pulmonary vascularity in the upper lobes suggesting potential upper lobe bullous disease. No adenopathy.  Supine and upright abdomen: There are loops of dilated small bowel with multiple air-fluid levels. There is air in the rectum. No free air. There is moderate stool throughout the colon.  IMPRESSION: Bowel gas pattern is concerning for a degree of obstruction given mild small bowel dilatation in multiple air-fluid levels. Air is noted in the rectum. It should  be noted that ileus or enteritis potentially could present similarly and are differential considerations. No free air.  Suspect a degree of underlying COPD. No lung edema or consolidation. There are scattered areas of scarring in the lower lung regions.   Electronically Signed   By: Lowella Grip III M.D.   On: 05/02/2015 10:36    Review of Systems  Unable to perform ROS: other   Blood pressure 118/87, pulse 72, temperature 97.5 F (36.4 C), temperature source Oral, resp. rate 18, height _0  (1.753 m), weight 49.896 kg (110 lb), SpO2 95 %. Physical Exam  Constitutional:  Thin male, no acute distress, almost deaf.  To the point of making communication almost impossible.   He is also angry.  He had episodes of this last admit.  But yelling at nurse, over possible IV site placement. Telling  her she was not God. Wearing paper clothes, when ask about it, also an angry response.  Said his clothes were to tight, and some other things that made no sense at all.  HENT:  Head: Normocephalic and atraumatic.  Eyes: Conjunctivae and EOM are normal. Right eye exhibits no discharge. Left eye exhibits no discharge.  Neck: No JVD present. No tracheal deviation present. No thyromegaly present.  Cardiovascular: Normal rate, regular rhythm, normal heart sounds and intact distal pulses.   No murmur heard. Respiratory: Effort normal and breath sounds normal. No respiratory distress. He has no wheezes. He has no rales. He exhibits no tenderness.  GI: Soft. He exhibits distension. He exhibits no mass. There is tenderness (minimal tenderness, he says he has some discomfort with repeat exam.). There is no rebound and no guarding.  Few hyper active bowel sounds   Musculoskeletal: He exhibits no edema.  Lymphadenopathy:    He has no cervical adenopathy.  Neurological: He is alert. No cranial nerve deficit.  Skin: Skin is warm and dry. No rash noted. No erythema. No pallor.  Psychiatric:  He is extremely hard  of hearing.  He told me he wants to go home, eat,  and have a cigarette. He gets angry over simple questions, and this is consistent with behavior recorded during last admission.  Nothing in the interaction made me think his behavior, mood, judgement or thought content was normal.     Assessment/Plan: SBO with hx of appendectomy Severe COPD/chronic prednisone use Ongoing tobacco use Pneumonia, treated 04/26/15 thru discharge 01/31/00 Hx of Systolic heart failure EF 20-25% Malnutrition Hard of hearing communication is very difficult with him.  Stage 1 pressure ulcer  04/29/15   Plan:  He does not have nausea and vomiting,but is distended and it would appear to be at least in part due to constipation. I have reviewed with Dr. Redmond Pulling and he agree's.  I would recommend Miralax and enema.Marland Kitchen  Anthony Bolton 05/02/2015, 2:05 PM

## 2015-05-02 NOTE — H&P (Signed)
History and Physical  Anthony Bolton:[id number] DOB: July 23, 1951 DOA: 05/02/2015  Referring physician: EDP PCP: [name], MD   Chief Complaint: ab pain  HPI: Anthony Bolton is a 64 y.o. male with PMH of hyperlipidemia COPD, severe hearing loss, systolic congestive heart failure (EF 20-25%), hyperlipidemia, who presents with recurrent abdominal pain. He was recently admitted to the hospital for sbo and CAP and was discharged on 8/2. This morning he reported recurrent ab pain, reported last bm this am, denies n/v. He was sent from the SNF to the ED, abdominal xray showed Bowel gas pattern is concerning for a degree of obstruction given mild small bowel dilatation in multiple air-fluid levels. Air is noted in the rectum. It should be noted that ileus or enteritis potentially could present similarly and are differential considerations. No free air.  General surgery consulted by EDP, recommended kept patient npo, admit to hosptialist service. Currently patient report ab pain, has resolved, has been passing gas, denies n/v, denies sob. He is very hard of hearing and very agitated, not sure if he can provide reliable answer.    Review of Systems:  Detail per HPI, Review of systems are otherwise negative  Past Medical History  Diagnosis Date  . COPD (chronic obstructive pulmonary disease)   . HOH (hard of hearing)   . CHF (congestive heart failure) EF 20 - 25% in 2015  . HLD (hyperlipidemia)    Past Surgical History  Procedure Laterality Date  . Appendectomy    . Left heart catheterization with coronary angiogram N/A 11/07/2013    Procedure: LEFT HEART CATHETERIZATION WITH CORONARY ANGIOGRAM;  Surgeon: Clent Demark, MD;  Location: [facility] CATH LAB;  Service: Cardiovascular;  Laterality: N/A;  . Cardiac stents      No stent history   Social History:  reports that he has been smoking Cigarettes.  He has never used smokeless tobacco. He reports that he does not drink alcohol or use  illicit drugs. Patient lives at SNF & is able to participate in activities of daily living independently   No Known Allergies  Family History  Problem Relation Age of Onset  . Stroke Mother       Prior to Admission medications   Medication Sig Start Date End Date Taking? Authorizing Provider  albuterol (PROVENTIL) (2.5 MG/3ML) 0.083% nebulizer solution Take 3 mLs (2.5 mg total) by nebulization every 2 (two) hours as needed for wheezing or shortness of breath. 03/04/15  Yes Maryann Mikhail, DO  aspirin EC 81 MG EC tablet Take 1 tablet (81 mg total) by mouth daily. 03/04/15  Yes Maryann Mikhail, DO  atorvastatin (LIPITOR) 40 MG tablet Take 1 tablet (40 mg total) by mouth at bedtime. 03/04/15  Yes Maryann Mikhail, DO  budesonide-formoterol (SYMBICORT) 160-4.5 MCG/ACT inhaler Inhale 2 puffs into the lungs 2 (two) times daily.   Yes Historical Provider, MD  [employer] SLEEP AID 5 MG TABS TAKE10 MG BY MOUTH 30 MIN BEFORE BED TIME. 04/18/15  Yes Historical Provider, MD  guaiFENesin (MUCINEX) 600 MG 12 hr tablet Take 2 tablets (1,200 mg total) by mouth 2 (two) times daily. 03/04/15  Yes Maryann Mikhail, DO  ipratropium-albuterol (DUONEB) 0.5-2.5 (3) MG/3ML SOLN Take 3 mLs by nebulization every 6 (six) hours. 03/04/15  Yes Maryann Mikhail, DO  levofloxacin (LEVAQUIN) 500 MG tablet Take 1 tablet (500 mg total) by mouth daily. 04/30/15  Yes Charlynne Cousins, MD  PROAIR HFA 108 (90 BASE) MCG/ACT inhaler INHALE 2 PUFFS EVERY 4 HOURS AS  NEEDED FOR WHEEZING OR SHORTNESS OF BREATH 04/18/15  Yes Historical Provider, MD  SPIRIVA HANDIHALER 18 MCG inhalation capsule INHALE CONTENTS OF 1 CAPSULE USING HANDIHALER EVERY DAY FOR BREATHING 04/18/15  Yes Historical Provider, MD  traZODone (DESYREL) 50 MG tablet Take 50 mg by mouth at bedtime.   Yes Historical Provider, MD  feeding supplement, ENSURE ENLIVE, (ENSURE ENLIVE) LIQD Take 237 mLs by mouth 2 (two) times daily between meals. Patient not taking: Reported on 03/13/2015 03/04/15    Cristal Ford, DO  Multiple Vitamin (MULTIVITAMIN WITH MINERALS) TABS tablet Take 1 tablet by mouth daily. Patient not taking: Reported on 03/13/2015 03/04/15   Velta Addison Mikhail, DO  predniSONE (DELTASONE) 10 MG tablet Take 1 tablet (10 mg total) by mouth daily with breakfast. Prednisone dosing: Take  Prednisone 40mg  (4 tabs) x 3 days, then taper to 30mg  (3 tabs) x 3 days, then 20mg  (2 tabs) x 3days, then 10mg  (1 tab) x 3days, then OFF. Patient not taking: Reported on 03/13/2015 03/04/15   Cristal Ford, DO    Physical Exam: BP 118/89 mmHg  Pulse 86  Temp(Src) 98.1 F (36.7 C) (Oral)  Resp 18  Ht 5\' 9"  (1.753 m)  Wt 51.4 kg (113 lb 5.1 oz)  BMI 16.73 kg/m2  SpO2 98%  General:  Agitated, very hard of hearing Eyes: PERRL ENT: unremarkable Neck: supple, no JVD Cardiovascular: RRR Respiratory: CTABL Abdomen: mild distension, currently reported pain has resolved, soft, bowel sounds active, does has intermittent high pitched bowel sounds. Skin: no rash, mild erythema at bony prominent to sacral region and right hip, no skin breakdown. Musculoskeletal:  No edema Psychiatric: agitated, angry Neurologic: no focal findings            Labs on Admission:  Basic Metabolic Panel:  Recent Labs Lab 04/26/15 1900 04/27/15 0213 05/02/15 1056  NA 135 135 136  K 4.1 4.0 3.8  CL 89* 94* 102  CO2 34* 30 27  GLUCOSE 119* 119* 104*  BUN 44* 41* 13  CREATININE 0.97 0.77 0.62  CALCIUM 9.4 8.8* 8.5*   Liver Function Tests:  Recent Labs Lab 04/26/15 1900 04/27/15 0213 05/02/15 1056  AST 25 20 22   ALT 24 22 25   ALKPHOS 55 48 42  BILITOT 1.6* 1.3* 0.4  PROT 7.4 6.7 5.5*  ALBUMIN 4.1 3.8 3.2*    Recent Labs Lab 04/26/15 1900 05/02/15 1056  LIPASE 26 24   No results for input(s): AMMONIA in the last 168 hours. CBC:  Recent Labs Lab 04/26/15 1900 04/27/15 0213 05/02/15 1056  WBC 11.6* 12.0* 14.0*  NEUTROABS 8.7*  --  11.4*  HGB 16.0 15.5 12.8*  HCT 47.9 44.4 38.9*  MCV  93.2 92.5 95.1  PLT 143* 138* 181   Cardiac Enzymes: No results for input(s): CKTOTAL, CKMB, CKMBINDEX, TROPONINI in the last 168 hours.  BNP (last 3 results)  Recent Labs  03/25/15 1839 04/06/15 2101 04/27/15 0213  BNP 145.2* 67.9 28.1    ProBNP (last 3 results) No results for input(s): PROBNP in the last 8760 hours.  CBG:  Recent Labs Lab 04/27/15 0729 04/28/15 0734 04/29/15 0743  GLUCAP 144* 97 100*    Radiological Exams on Admission: Ct Abdomen Pelvis W Contrast  05/02/2015   CLINICAL DATA:  Complaint of right lower abdominal pain. The patient was seen on Friday in discharged on Sunday. No bowel movement since admission. Pain began yesterday. Previous appendectomy.  EXAM: CT ABDOMEN AND PELVIS WITH CONTRAST  TECHNIQUE: Multidetector CT imaging of the  abdomen and pelvis was performed using the standard protocol following bolus administration of intravenous contrast.  CONTRAST:  60mL OMNIPAQUE IOHEXOL 300 MG/ML  SOLN  COMPARISON:  05/02/2015 and 04/26/2015  FINDINGS: Lower chest: Heart size is normal. No imaged pericardial effusion or significant coronary artery calcifications. No pulmonary nodules, pleural effusions, or infiltrates.  Upper abdomen: No focal abnormality identified within the liver, spleen, pancreas, or right adrenal gland. Left adrenal mass is 4.2 cm and consistent with benign adenoma based on previous MRI characterization. Small left renal cyst in the midpole region. There is symmetric bilateral renal excretion. The gallbladder is present and is contracted.  Gastrointestinal tract: The stomach has a normal appearance. There has been significant improvement in the appearance of numerous dilated loops of small bowel. Some of the contrast filled loops continued to be somewhat dilated without in discrete transition zone or mass identified. Colonic loops are distended with stool. There are scattered colonic diverticula. The rectosigmoid colon shows mildly thickened wall,  nonspecific in appearance.  Pelvis: There is a small amount of free pelvic fluid. Urinary bladder is distended. Prostate gland in is prominent in size.  Retroperitoneum: Atherosclerotic calcification and mural thrombus involving the abdominal aorta and iliac arteries.  Abdominal wall: Unremarkable.  Osseous structures: No suspicious lytic or blastic lesions are identified. Mild spondylosis in the lower lumbar spine.  IMPRESSION: 1. There has been significant improvement in the dilatation of small bowel loops. However there is persistent significant small bowel dilatation without point of obstruction or mass identified. 2. Small amount of free pelvic fluid. 3. Significant stool burden. 4. Nonspecific thickening of the sigmoid colon and rectum, not associated with inflammation. 5. No free intraperitoneal air. 6. Stable left adrenal adenoma. 7. Atherosclerosis of the abdominal aorta.   Electronically Signed   By: Nolon Nations M.D.   On: 05/02/2015 13:02   Dg Abd Acute W/chest  05/02/2015   CLINICAL DATA:  One day history of lower abdominal pain with nausea and shortness of breath  EXAM: DG ABDOMEN ACUTE W/ 1V CHEST  COMPARISON:  Abdomen series April 26, 2015; supine abdomen April 29, 2015  FINDINGS: PA chest: Lungs are hyperexpanded. There is patchy scarring in the bases. Currently no edema or consolidation. The heart size is normal. There is diminished pulmonary vascularity in the upper lobes suggesting potential upper lobe bullous disease. No adenopathy.  Supine and upright abdomen: There are loops of dilated small bowel with multiple air-fluid levels. There is air in the rectum. No free air. There is moderate stool throughout the colon.  IMPRESSION: Bowel gas pattern is concerning for a degree of obstruction given mild small bowel dilatation in multiple air-fluid levels. Air is noted in the rectum. It should be noted that ileus or enteritis potentially could present similarly and are differential  considerations. No free air.  Suspect a degree of underlying COPD. No lung edema or consolidation. There are scattered areas of scarring in the lower lung regions.   Electronically Signed   By: Lowella Grip III M.D.   On: 05/02/2015 10:36    EKG: Independently reviewed. NSR, no acute st/t changes  Assessment/Plan Present on Admission:  . Small bowel obstruction . SBO (small bowel obstruction)  Recurrent SBO: bowel rest, ivf, currently ab pain has resolved, no active n/v. General surgery follow.  Recently treated for CAP, kub did not review infiltrate, continue two more days of levaquin to finish course of abx, (IV given due to npo)  COPD stable, continue home meds.  Chronic systolic CHF: euvolemic to dry, close monitor volume status. Not on lasix at home.   DVT prophylaxis: lovenox  Consultants: general surgery  Code Status: full   Family Communication:  Patient   Disposition Plan: admit to tele obs  Time spent: 28mins  Luise Yamamoto MD, PhD [facility] Pager [phone] If 7PM-7AM, please contact night-coverage at [email or web address], password [facility]

## 2015-05-02 NOTE — ED Notes (Signed)
Pt voided before knowing urine sample was needed. Has been made aware and urinal is at bedside

## 2015-05-02 NOTE — ED Notes (Signed)
Pt comes in today with a c/o right lower abdominal pain. Pt was seen here Friday and was discharged from facility on Sunday. Pt states that he has not had a BM since his admission to the hospital. Pt states that he tried to have a BM this morning with little to no relief. Pt states the pain began yesterday. Pt was also given a duoneb en route due to some wheezing.

## 2015-05-02 NOTE — ED Provider Notes (Signed)
CSN: [id number]     Arrival date & time 05/02/15  3976 History   First MD Initiated Contact with Patient 05/02/15 [phone]     Chief Complaint  Patient presents with  . Abdominal Pain     (Consider location/radiation/quality/duration/timing/severity/associated sxs/prior Treatment) Patient is a 64 y.o. male presenting with abdominal pain.  Abdominal Pain Pain location:  Generalized Pain quality: dull and pressure   Pain radiates to:  Does not radiate Pain severity:  Moderate Onset quality:  Gradual Duration:  4 days Timing:  Constant Progression:  Worsening Chronicity:  Recurrent Context comment:  Recent visit and admission for SBO Relieved by:  Nothing Worsened by:  Nothing tried Ineffective treatments:  None tried Associated symptoms: anorexia and constipation   Associated symptoms: no nausea and no vomiting     Past Medical History  Diagnosis Date  . COPD (chronic obstructive pulmonary disease)   . HOH (hard of hearing)   . CHF (congestive heart failure) EF 20 - 25% in 2015  . HLD (hyperlipidemia)    Past Surgical History  Procedure Laterality Date  . Appendectomy    . Left heart catheterization with coronary angiogram N/A 11/07/2013    Procedure: LEFT HEART CATHETERIZATION WITH CORONARY ANGIOGRAM;  Surgeon: Clent Demark, MD;  Location: [facility] CATH LAB;  Service: Cardiovascular;  Laterality: N/A;  . Cardiac stents      No stent history   Family History  Problem Relation Age of Onset  . Stroke Mother    History  Substance Use Topics  . Smoking status: Current Every Day Smoker    Types: Cigarettes  . Smokeless tobacco: Never Used  . Alcohol Use: No    Review of Systems  Gastrointestinal: Positive for abdominal pain, constipation and anorexia. Negative for nausea and vomiting.  All other systems reviewed and are negative.     Allergies  Review of patient's allergies indicates no known allergies.  Home Medications   Prior to Admission medications    Medication Sig Start Date End Date Taking? Authorizing Provider  albuterol (PROVENTIL) (2.5 MG/3ML) 0.083% nebulizer solution Take 3 mLs (2.5 mg total) by nebulization every 2 (two) hours as needed for wheezing or shortness of breath. 03/04/15  Yes Maryann Mikhail, DO  aspirin EC 81 MG EC tablet Take 1 tablet (81 mg total) by mouth daily. 03/04/15  Yes Maryann Mikhail, DO  atorvastatin (LIPITOR) 40 MG tablet Take 1 tablet (40 mg total) by mouth at bedtime. 03/04/15  Yes Maryann Mikhail, DO  budesonide-formoterol (SYMBICORT) 160-4.5 MCG/ACT inhaler Inhale 2 puffs into the lungs 2 (two) times daily.   Yes Historical Provider, MD  [employer] SLEEP AID 5 MG TABS TAKE10 MG BY MOUTH 30 MIN BEFORE BED TIME. 04/18/15  Yes Historical Provider, MD  guaiFENesin (MUCINEX) 600 MG 12 hr tablet Take 2 tablets (1,200 mg total) by mouth 2 (two) times daily. 03/04/15  Yes Maryann Mikhail, DO  ipratropium-albuterol (DUONEB) 0.5-2.5 (3) MG/3ML SOLN Take 3 mLs by nebulization every 6 (six) hours. 03/04/15  Yes Maryann Mikhail, DO  levofloxacin (LEVAQUIN) 500 MG tablet Take 1 tablet (500 mg total) by mouth daily. 04/30/15  Yes Charlynne Cousins, MD  PROAIR HFA 108 (90 BASE) MCG/ACT inhaler INHALE 2 PUFFS EVERY 4 HOURS AS NEEDED FOR WHEEZING OR SHORTNESS OF BREATH 04/18/15  Yes Historical Provider, MD  SPIRIVA HANDIHALER 18 MCG inhalation capsule INHALE CONTENTS OF 1 CAPSULE USING HANDIHALER EVERY DAY FOR BREATHING 04/18/15  Yes Historical Provider, MD  traZODone (DESYREL) 50 MG tablet Take  50 mg by mouth at bedtime.   Yes Historical Provider, MD  feeding supplement, ENSURE ENLIVE, (ENSURE ENLIVE) LIQD Take 237 mLs by mouth 2 (two) times daily between meals. Patient not taking: Reported on 03/13/2015 03/04/15   Cristal Ford, DO  Multiple Vitamin (MULTIVITAMIN WITH MINERALS) TABS tablet Take 1 tablet by mouth daily. Patient not taking: Reported on 03/13/2015 03/04/15   Velta Addison Mikhail, DO  predniSONE (DELTASONE) 10 MG tablet Take 1 tablet (10  mg total) by mouth daily with breakfast. Prednisone dosing: Take  Prednisone 40mg  (4 tabs) x 3 days, then taper to 30mg  (3 tabs) x 3 days, then 20mg  (2 tabs) x 3days, then 10mg  (1 tab) x 3days, then OFF. Patient not taking: Reported on 03/13/2015 03/04/15   Maryann Mikhail, DO   BP 127/73 mmHg  Pulse 81  Temp(Src) 98.3 F (36.8 C) (Oral)  Resp 18  Ht 5\' 9"  (1.753 m)  Wt 113 lb 5.1 oz (51.4 kg)  BMI 16.73 kg/m2  SpO2 95% Physical Exam  Constitutional: He is oriented to person, place, and time. He appears well-developed and well-nourished.  HENT:  Head: Normocephalic and atraumatic.  Eyes: Conjunctivae and EOM are normal.  Neck: Normal range of motion. Neck supple.  Cardiovascular: Normal rate, regular rhythm and normal heart sounds.   Pulmonary/Chest: Effort normal and breath sounds normal. No respiratory distress.  Abdominal: He exhibits no distension. There is tenderness in the right lower quadrant, periumbilical area and suprapubic area. There is no rebound and no guarding.  Musculoskeletal: Normal range of motion.  Neurological: He is alert and oriented to person, place, and time.  Skin: Skin is warm and dry.  Vitals reviewed.   ED Course  Procedures (including critical care time) Labs Review Labs Reviewed  COMPREHENSIVE METABOLIC PANEL - Abnormal; Notable for the following:    Glucose, Bld 104 (*)    Calcium 8.5 (*)    Total Protein 5.5 (*)    Albumin 3.2 (*)    All other components within normal limits  CBC WITH DIFFERENTIAL/PLATELET - Abnormal; Notable for the following:    WBC 14.0 (*)    RBC 4.09 (*)    Hemoglobin 12.8 (*)    HCT 38.9 (*)    Neutrophils Relative % 82 (*)    Neutro Abs 11.4 (*)    Lymphocytes Relative 10 (*)    All other components within normal limits  COMPREHENSIVE METABOLIC PANEL - Abnormal; Notable for the following:    Creatinine, Ser 0.57 (*)    Calcium 8.1 (*)    Total Protein 4.8 (*)    Albumin 2.7 (*)    All other components within  normal limits  CBC - Abnormal; Notable for the following:    RBC 3.95 (*)    Hemoglobin 12.4 (*)    HCT 36.9 (*)    All other components within normal limits  LIPASE, BLOOD  URINALYSIS, ROUTINE W REFLEX MICROSCOPIC (NOT AT [facility])  PROTIME-INR    Imaging Review Ct Abdomen Pelvis W Contrast  05/02/2015   CLINICAL DATA:  Complaint of right lower abdominal pain. The patient was seen on Friday in discharged on Sunday. No bowel movement since admission. Pain began yesterday. Previous appendectomy.  EXAM: CT ABDOMEN AND PELVIS WITH CONTRAST  TECHNIQUE: Multidetector CT imaging of the abdomen and pelvis was performed using the standard protocol following bolus administration of intravenous contrast.  CONTRAST:  12mL OMNIPAQUE IOHEXOL 300 MG/ML  SOLN  COMPARISON:  05/02/2015 and 04/26/2015  FINDINGS: Lower chest: Heart  size is normal. No imaged pericardial effusion or significant coronary artery calcifications. No pulmonary nodules, pleural effusions, or infiltrates.  Upper abdomen: No focal abnormality identified within the liver, spleen, pancreas, or right adrenal gland. Left adrenal mass is 4.2 cm and consistent with benign adenoma based on previous MRI characterization. Small left renal cyst in the midpole region. There is symmetric bilateral renal excretion. The gallbladder is present and is contracted.  Gastrointestinal tract: The stomach has a normal appearance. There has been significant improvement in the appearance of numerous dilated loops of small bowel. Some of the contrast filled loops continued to be somewhat dilated without in discrete transition zone or mass identified. Colonic loops are distended with stool. There are scattered colonic diverticula. The rectosigmoid colon shows mildly thickened wall, nonspecific in appearance.  Pelvis: There is a small amount of free pelvic fluid. Urinary bladder is distended. Prostate gland in is prominent in size.  Retroperitoneum: Atherosclerotic calcification  and mural thrombus involving the abdominal aorta and iliac arteries.  Abdominal wall: Unremarkable.  Osseous structures: No suspicious lytic or blastic lesions are identified. Mild spondylosis in the lower lumbar spine.  IMPRESSION: 1. There has been significant improvement in the dilatation of small bowel loops. However there is persistent significant small bowel dilatation without point of obstruction or mass identified. 2. Small amount of free pelvic fluid. 3. Significant stool burden. 4. Nonspecific thickening of the sigmoid colon and rectum, not associated with inflammation. 5. No free intraperitoneal air. 6. Stable left adrenal adenoma. 7. Atherosclerosis of the abdominal aorta.   Electronically Signed   By: Nolon Nations M.D.   On: 05/02/2015 13:02   Dg Abd Acute W/chest  05/02/2015   CLINICAL DATA:  One day history of lower abdominal pain with nausea and shortness of breath  EXAM: DG ABDOMEN ACUTE W/ 1V CHEST  COMPARISON:  Abdomen series April 26, 2015; supine abdomen April 29, 2015  FINDINGS: PA chest: Lungs are hyperexpanded. There is patchy scarring in the bases. Currently no edema or consolidation. The heart size is normal. There is diminished pulmonary vascularity in the upper lobes suggesting potential upper lobe bullous disease. No adenopathy.  Supine and upright abdomen: There are loops of dilated small bowel with multiple air-fluid levels. There is air in the rectum. No free air. There is moderate stool throughout the colon.  IMPRESSION: Bowel gas pattern is concerning for a degree of obstruction given mild small bowel dilatation in multiple air-fluid levels. Air is noted in the rectum. It should be noted that ileus or enteritis potentially could present similarly and are differential considerations. No free air.  Suspect a degree of underlying COPD. No lung edema or consolidation. There are scattered areas of scarring in the lower lung regions.   Electronically Signed   By: Lowella Grip  III M.D.   On: 05/02/2015 10:36     EKG Interpretation   Date/Time:  Thursday May 02 2015 10:33:38 EDT Ventricular Rate:  80 PR Interval:  144 QRS Duration: 88 QT Interval:  386 QTC Calculation: 445 R Axis:   75 Text Interpretation:  Sinus rhythm Probable anteroseptal infarct, old No  significant change since last tracing Confirmed by Debby Freiberg [phone])  on 05/02/2015 11:29:39 AM      MDM   Final diagnoses:  None    64 y.o. male with pertinent PMH of recent admission for SBO presents with recurrent abd pain as above.  Physical exam as above.  Elba Barman revealed persistent SBO.  Admitted to medicine  after surgical consult  I have reviewed all laboratory and imaging studies if ordered as above  No diagnosis found.      Debby Freiberg, MD 05/03/15 [phone]

## 2015-05-03 DIAGNOSIS — I5022 Chronic systolic (congestive) heart failure: Secondary | ICD-10-CM | POA: Diagnosis present

## 2015-05-03 DIAGNOSIS — E785 Hyperlipidemia, unspecified: Secondary | ICD-10-CM | POA: Diagnosis present

## 2015-05-03 DIAGNOSIS — H9190 Unspecified hearing loss, unspecified ear: Secondary | ICD-10-CM | POA: Diagnosis present

## 2015-05-03 DIAGNOSIS — J449 Chronic obstructive pulmonary disease, unspecified: Secondary | ICD-10-CM | POA: Diagnosis present

## 2015-05-03 DIAGNOSIS — Z955 Presence of coronary angioplasty implant and graft: Secondary | ICD-10-CM | POA: Diagnosis not present

## 2015-05-03 DIAGNOSIS — K5669 Other intestinal obstruction: Secondary | ICD-10-CM | POA: Diagnosis not present

## 2015-05-03 DIAGNOSIS — Z823 Family history of stroke: Secondary | ICD-10-CM | POA: Diagnosis not present

## 2015-05-03 DIAGNOSIS — Z7982 Long term (current) use of aspirin: Secondary | ICD-10-CM | POA: Diagnosis not present

## 2015-05-03 DIAGNOSIS — Z7952 Long term (current) use of systemic steroids: Secondary | ICD-10-CM | POA: Diagnosis not present

## 2015-05-03 DIAGNOSIS — Z8701 Personal history of pneumonia (recurrent): Secondary | ICD-10-CM | POA: Diagnosis not present

## 2015-05-03 DIAGNOSIS — Z681 Body mass index (BMI) 19 or less, adult: Secondary | ICD-10-CM | POA: Diagnosis not present

## 2015-05-03 DIAGNOSIS — E43 Unspecified severe protein-calorie malnutrition: Secondary | ICD-10-CM | POA: Diagnosis present

## 2015-05-03 DIAGNOSIS — F17210 Nicotine dependence, cigarettes, uncomplicated: Secondary | ICD-10-CM | POA: Diagnosis present

## 2015-05-03 DIAGNOSIS — Z9049 Acquired absence of other specified parts of digestive tract: Secondary | ICD-10-CM | POA: Diagnosis present

## 2015-05-03 DIAGNOSIS — Z79899 Other long term (current) drug therapy: Secondary | ICD-10-CM | POA: Diagnosis not present

## 2015-05-03 DIAGNOSIS — E279 Disorder of adrenal gland, unspecified: Secondary | ICD-10-CM | POA: Diagnosis present

## 2015-05-03 DIAGNOSIS — L8991 Pressure ulcer of unspecified site, stage 1: Secondary | ICD-10-CM | POA: Diagnosis present

## 2015-05-03 DIAGNOSIS — R109 Unspecified abdominal pain: Secondary | ICD-10-CM | POA: Diagnosis present

## 2015-05-03 DIAGNOSIS — K5660 Unspecified intestinal obstruction: Secondary | ICD-10-CM | POA: Diagnosis not present

## 2015-05-03 LAB — COMPREHENSIVE METABOLIC PANEL
ALBUMIN: 2.7 g/dL — AB (ref 3.5–5.0)
ALK PHOS: 42 U/L (ref 38–126)
ALT: 20 U/L (ref 17–63)
ANION GAP: 5 (ref 5–15)
AST: 15 U/L (ref 15–41)
BUN: 9 mg/dL (ref 6–20)
CALCIUM: 8.1 mg/dL — AB (ref 8.9–10.3)
CO2: 26 mmol/L (ref 22–32)
CREATININE: 0.57 mg/dL — AB (ref 0.61–1.24)
Chloride: 106 mmol/L (ref 101–111)
GFR calc Af Amer: >60 mL/min (ref >60)
GFR calc non Af Amer: >60 mL/min (ref >60)
GLUCOSE: 97 mg/dL (ref 65–99)
Potassium: 4 mmol/L (ref 3.5–5.1)
Sodium: 137 mmol/L (ref 135–145)
Total Bilirubin: 0.5 mg/dL (ref 0.3–1.2)
Total Protein: 4.8 g/dL — ABNORMAL LOW (ref 6.5–8.1)

## 2015-05-03 LAB — CBC
HCT: 36.9 % — ABNORMAL LOW (ref 39.0–52.0)
Hemoglobin: 12.4 g/dL — ABNORMAL LOW (ref 13.0–17.0)
MCH: 31.4 pg (ref 26.0–34.0)
MCHC: 33.6 g/dL (ref 30.0–36.0)
MCV: 93.4 fL (ref 78.0–100.0)
Platelets: 196 10*3/uL (ref 150–400)
RBC: 3.95 MIL/uL — AB (ref 4.22–5.81)
RDW: 13 % (ref 11.5–15.5)
WBC: 10.4 10*3/uL (ref 4.0–10.5)

## 2015-05-03 LAB — PROTIME-INR
INR: 1.08 (ref 0.00–1.49)
Prothrombin Time: 14.2 seconds (ref 11.6–15.2)

## 2015-05-03 MED ORDER — POLYETHYLENE GLYCOL 3350 17 G PO PACK
17.0000 g | PACK | Freq: Every day | ORAL | Status: DC
  Administered 2015-05-03 – 2015-05-04 (×2): 17 g via ORAL
  Filled 2015-05-03 (×2): qty 1

## 2015-05-03 MED ORDER — FLEET ENEMA 7-19 GM/118ML RE ENEM
1.0000 | ENEMA | Freq: Once | RECTAL | Status: DC
  Filled 2015-05-03: qty 1

## 2015-05-03 NOTE — Progress Notes (Signed)
Day RN talked to patient's wife on the phone around 1920 and gave her the discharge instructions, At 1935, tech wheeled patient downstairs to meet his wife so she could bring him home.

## 2015-05-03 NOTE — Progress Notes (Signed)
PROGRESS NOTE  Anthony Bolton XHB:[id number] DOB: November 23, 1950 DOA: 05/02/2015 PCP: [name], MD  HPI/Recap of past 24 hours:  Angry elderly male, very hard of hearing, not able to provide reliable history, does not seem in pain. No observed n/v.  Assessment/Plan: Active Problems:   SBO (small bowel obstruction)   Small bowel obstruction  Recurrent SBO: bowel rest, ivf, currently ab pain has resolved, no active n/v. General surgery follow. Diet advancement per surgery.  Recently treated for CAP, kub did not review infiltrate, continue two more days of levaquin to finish course of abx, (IV given due to npo)  COPD stable, continue home meds. No wheezing  Chronic systolic CHF: euvolemic to dry, close monitor volume status. Not on lasix at home.   DVT prophylaxis: lovenox  Consultants: general surgery  Code Status: full   Family Communication: Patient    Disposition Plan: return to SNF if cleared by surgery   Consultants:  General surgery  Procedures:  none  Antibiotics:  none   Objective: BP 127/73 mmHg  Pulse 81  Temp(Src) 98.3 F (36.8 C) (Oral)  Resp 18  Ht 5\' 9"  (1.753 m)  Wt 51.4 kg (113 lb 5.1 oz)  BMI 16.73 kg/m2  SpO2 95%  Intake/Output Summary (Last 24 hours) at 05/03/15 1145 Last data filed at 05/03/15 0700  Gross per 24 hour  Intake    600 ml  Output      0 ml  Net    600 ml   Filed Weights   05/02/15 0944 05/02/15 1532  Weight: 49.896 kg (110 lb) 51.4 kg (113 lb 5.1 oz)    Exam:   General:  NAD, angry   Cardiovascular: RRR  Respiratory: CTABL  Abdomen: Soft/ND/NT, positive BS  Musculoskeletal: No Edema  Neuro: angry, not cooperative with exam, no obvious focal deficit, very hard of hearing  Data Reviewed: Basic Metabolic Panel:  Recent Labs Lab 04/26/15 1900 04/27/15 0213 05/02/15 1056 05/03/15 0400  NA 135 135 136 137  K 4.1 4.0 3.8 4.0  CL 89* 94* 102 106  CO2 34* 30 27 26   GLUCOSE 119* 119* 104* 97    BUN 44* 41* 13 9  CREATININE 0.97 0.77 0.62 0.57*  CALCIUM 9.4 8.8* 8.5* 8.1*   Liver Function Tests:  Recent Labs Lab 04/26/15 1900 04/27/15 0213 05/02/15 1056 05/03/15 0400  AST 25 20 22 15   ALT 24 22 25 20   ALKPHOS 55 48 42 42  BILITOT 1.6* 1.3* 0.4 0.5  PROT 7.4 6.7 5.5* 4.8*  ALBUMIN 4.1 3.8 3.2* 2.7*    Recent Labs Lab 04/26/15 1900 05/02/15 1056  LIPASE 26 24   No results for input(s): AMMONIA in the last 168 hours. CBC:  Recent Labs Lab 04/26/15 1900 04/27/15 0213 05/02/15 1056 05/03/15 0400  WBC 11.6* 12.0* 14.0* 10.4  NEUTROABS 8.7*  --  11.4*  --   HGB 16.0 15.5 12.8* 12.4*  HCT 47.9 44.4 38.9* 36.9*  MCV 93.2 92.5 95.1 93.4  PLT 143* 138* 181 196   Cardiac Enzymes:   No results for input(s): CKTOTAL, CKMB, CKMBINDEX, TROPONINI in the last 168 hours. BNP (last 3 results)  Recent Labs  03/25/15 1839 04/06/15 2101 04/27/15 0213  BNP 145.2* 67.9 28.1    ProBNP (last 3 results) No results for input(s): PROBNP in the last 8760 hours.  CBG:  Recent Labs Lab 04/27/15 0729 04/28/15 0734 04/29/15 0743  GLUCAP 144* 97 100*    Recent Results (from the past  240 hour(s))  Culture, blood (x 2)     Status: None   Collection Time: 04/27/15 12:00 AM  Result Value Ref Range Status   Specimen Description BLOOD LEFT ANTECUBITAL  Final   Special Requests BOTTLES DRAWN AEROBIC ONLY 5CC  Final   Culture   Final    NO GROWTH 5 DAYS Performed at [facility]    Report Status 05/02/2015 FINAL  Final  Culture, blood (x 2)     Status: None   Collection Time: 04/27/15 12:00 AM  Result Value Ref Range Status   Specimen Description BLOOD BLOOD LEFT HAND  Final   Special Requests BOTTLES DRAWN AEROBIC ONLY 5ML  Final   Culture   Final    NO GROWTH 5 DAYS Performed at [facility]    Report Status 05/02/2015 FINAL  Final     Studies: Ct Abdomen Pelvis W Contrast  05/02/2015   CLINICAL DATA:  Complaint of right lower abdominal pain.  The patient was seen on Friday in discharged on Sunday. No bowel movement since admission. Pain began yesterday. Previous appendectomy.  EXAM: CT ABDOMEN AND PELVIS WITH CONTRAST  TECHNIQUE: Multidetector CT imaging of the abdomen and pelvis was performed using the standard protocol following bolus administration of intravenous contrast.  CONTRAST:  42mL OMNIPAQUE IOHEXOL 300 MG/ML  SOLN  COMPARISON:  05/02/2015 and 04/26/2015  FINDINGS: Lower chest: Heart size is normal. No imaged pericardial effusion or significant coronary artery calcifications. No pulmonary nodules, pleural effusions, or infiltrates.  Upper abdomen: No focal abnormality identified within the liver, spleen, pancreas, or right adrenal gland. Left adrenal mass is 4.2 cm and consistent with benign adenoma based on previous MRI characterization. Small left renal cyst in the midpole region. There is symmetric bilateral renal excretion. The gallbladder is present and is contracted.  Gastrointestinal tract: The stomach has a normal appearance. There has been significant improvement in the appearance of numerous dilated loops of small bowel. Some of the contrast filled loops continued to be somewhat dilated without in discrete transition zone or mass identified. Colonic loops are distended with stool. There are scattered colonic diverticula. The rectosigmoid colon shows mildly thickened wall, nonspecific in appearance.  Pelvis: There is a small amount of free pelvic fluid. Urinary bladder is distended. Prostate gland in is prominent in size.  Retroperitoneum: Atherosclerotic calcification and mural thrombus involving the abdominal aorta and iliac arteries.  Abdominal wall: Unremarkable.  Osseous structures: No suspicious lytic or blastic lesions are identified. Mild spondylosis in the lower lumbar spine.  IMPRESSION: 1. There has been significant improvement in the dilatation of small bowel loops. However there is persistent significant small bowel  dilatation without point of obstruction or mass identified. 2. Small amount of free pelvic fluid. 3. Significant stool burden. 4. Nonspecific thickening of the sigmoid colon and rectum, not associated with inflammation. 5. No free intraperitoneal air. 6. Stable left adrenal adenoma. 7. Atherosclerosis of the abdominal aorta.   Electronically Signed   By: Nolon Nations M.D.   On: 05/02/2015 13:02    Scheduled Meds: . budesonide-formoterol  2 puff Inhalation BID  . enoxaparin (LOVENOX) injection  40 mg Subcutaneous Q24H  . ipratropium-albuterol  3 mL Nebulization Q6H  . levofloxacin (LEVAQUIN) IV  750 mg Intravenous Q24H  . tiotropium  18 mcg Inhalation Daily    Continuous Infusions: . dextrose 5 % and 0.9 % NaCl with KCl 20 mEq/L 50 mL/hr at 05/02/15 1726     Time spent: 57mins  Yisrael Obryan  MD, PhD  [facility] Pager [phone]. If 7PM-7AM, please contact night-coverage at [email or web address], password [facility] 05/03/2015, 11:45 AM

## 2015-05-03 NOTE — Progress Notes (Signed)
Subjective: Says his stomach doesn't hurt, and he feels better.  No BM.  Objective: Vital signs in last 24 hours: Temp:  [98.1 F (36.7 C)-98.3 F (36.8 C)] 98.3 F (36.8 C) (08/05 0440) Pulse Rate:  [77-86] 81 (08/05 0440) Resp:  [18] 18 (08/05 0440) BP: (115-127)/(73-89) 127/73 mmHg (08/05 0440) SpO2:  [95 %-99 %] 95 % (08/05 0805) Weight:  [51.4 kg (113 lb 5.1 oz)] 51.4 kg (113 lb 5.1 oz) (08/04 1532) Last BM Date: 05/02/15 No BM recorded, No emesis Afebrile, VSS Labs Ok Intake/Output from previous day: 08/04 0701 - 08/05 0700 In: 600 [I.V.:600] Out: -  Intake/Output this shift:    General appearance: alert, cooperative and no distress GI: soft, non-tender; bowel sounds normal; no masses,  no organomegaly and He does not seem distended.  Lab Results:   Recent Labs  05/02/15 1056 05/03/15 0400  WBC 14.0* 10.4  HGB 12.8* 12.4*  HCT 38.9* 36.9*  PLT 181 196    BMET  Recent Labs  05/02/15 1056 05/03/15 0400  NA 136 137  K 3.8 4.0  CL 102 106  CO2 27 26  GLUCOSE 104* 97  BUN 13 9  CREATININE 0.62 0.57*  CALCIUM 8.5* 8.1*   PT/INR  Recent Labs  05/03/15 0400  LABPROT 14.2  INR 1.08     Recent Labs Lab 04/26/15 1900 04/27/15 0213 05/02/15 1056 05/03/15 0400  AST 25 20 22 15   ALT 24 22 25 20   ALKPHOS 55 48 42 42  BILITOT 1.6* 1.3* 0.4 0.5  PROT 7.4 6.7 5.5* 4.8*  ALBUMIN 4.1 3.8 3.2* 2.7*     Lipase     Component Value Date/Time   LIPASE 24 05/02/2015 1056     Studies/Results: Ct Abdomen Pelvis W Contrast  05/02/2015   CLINICAL DATA:  Complaint of right lower abdominal pain. The patient was seen on Friday in discharged on Sunday. No bowel movement since admission. Pain began yesterday. Previous appendectomy.  EXAM: CT ABDOMEN AND PELVIS WITH CONTRAST  TECHNIQUE: Multidetector CT imaging of the abdomen and pelvis was performed using the standard protocol following bolus administration of intravenous contrast.  CONTRAST:  19mL  OMNIPAQUE IOHEXOL 300 MG/ML  SOLN  COMPARISON:  05/02/2015 and 04/26/2015  FINDINGS: Lower chest: Heart size is normal. No imaged pericardial effusion or significant coronary artery calcifications. No pulmonary nodules, pleural effusions, or infiltrates.  Upper abdomen: No focal abnormality identified within the liver, spleen, pancreas, or right adrenal gland. Left adrenal mass is 4.2 cm and consistent with benign adenoma based on previous MRI characterization. Small left renal cyst in the midpole region. There is symmetric bilateral renal excretion. The gallbladder is present and is contracted.  Gastrointestinal tract: The stomach has a normal appearance. There has been significant improvement in the appearance of numerous dilated loops of small bowel. Some of the contrast filled loops continued to be somewhat dilated without in discrete transition zone or mass identified. Colonic loops are distended with stool. There are scattered colonic diverticula. The rectosigmoid colon shows mildly thickened wall, nonspecific in appearance.  Pelvis: There is a small amount of free pelvic fluid. Urinary bladder is distended. Prostate gland in is prominent in size.  Retroperitoneum: Atherosclerotic calcification and mural thrombus involving the abdominal aorta and iliac arteries.  Abdominal wall: Unremarkable.  Osseous structures: No suspicious lytic or blastic lesions are identified. Mild spondylosis in the lower lumbar spine.  IMPRESSION: 1. There has been significant improvement in the dilatation of small bowel loops. However there  is persistent significant small bowel dilatation without point of obstruction or mass identified. 2. Small amount of free pelvic fluid. 3. Significant stool burden. 4. Nonspecific thickening of the sigmoid colon and rectum, not associated with inflammation. 5. No free intraperitoneal air. 6. Stable left adrenal adenoma. 7. Atherosclerosis of the abdominal aorta.   Electronically Signed   By:  Nolon Nations M.D.   On: 05/02/2015 13:02   Dg Abd Acute W/chest  05/02/2015   CLINICAL DATA:  One day history of lower abdominal pain with nausea and shortness of breath  EXAM: DG ABDOMEN ACUTE W/ 1V CHEST  COMPARISON:  Abdomen series April 26, 2015; supine abdomen April 29, 2015  FINDINGS: PA chest: Lungs are hyperexpanded. There is patchy scarring in the bases. Currently no edema or consolidation. The heart size is normal. There is diminished pulmonary vascularity in the upper lobes suggesting potential upper lobe bullous disease. No adenopathy.  Supine and upright abdomen: There are loops of dilated small bowel with multiple air-fluid levels. There is air in the rectum. No free air. There is moderate stool throughout the colon.  IMPRESSION: Bowel gas pattern is concerning for a degree of obstruction given mild small bowel dilatation in multiple air-fluid levels. Air is noted in the rectum. It should be noted that ileus or enteritis potentially could present similarly and are differential considerations. No free air.  Suspect a degree of underlying COPD. No lung edema or consolidation. There are scattered areas of scarring in the lower lung regions.   Electronically Signed   By: Lowella Grip III M.D.   On: 05/02/2015 10:36    Medications: . budesonide-formoterol  2 puff Inhalation BID  . enoxaparin (LOVENOX) injection  40 mg Subcutaneous Q24H  . ipratropium-albuterol  3 mL Nebulization Q6H  . levofloxacin (LEVAQUIN) IV  750 mg Intravenous Q24H  . tiotropium  18 mcg Inhalation Daily    Assessment/Plan SBO with hx of appendectomy Constipation  Severe COPD/chronic prednisone use Ongoing tobacco use Pneumonia, treated 04/26/15 thru discharge 09/28/53 Hx of Systolic heart failure EF 20-25% Malnutrition Hard of hearing communication is very difficult with him.  Stage 1 pressure ulcer 04/29/15 Antibiotics:  levaquin DVT:  lovenox   Plan:  Full liquids, Miralax and fleets enema. KUB in Am.      Anthony Bolton 05/03/2015

## 2015-05-03 NOTE — Progress Notes (Signed)
Initial Nutrition Assessment  DOCUMENTATION CODES:   Severe malnutrition in context of chronic illness, Underweight  INTERVENTION:  - RD will continue to monitor for needs pending diet advancement versus need for nutrition support  NUTRITION DIAGNOSIS:   Malnutrition related to chronic illness as evidenced by severe depletion of body fat, severe depletion of muscle mass.  GOAL:   Patient will meet greater than or equal to 90% of their needs  MONITOR:   Diet advancement, Weight trends, Labs, I & O's  REASON FOR ASSESSMENT:   Malnutrition Screening Tool  ASSESSMENT:   64 y.o. male with PMH of hyperlipidemia COPD, severe hearing loss, systolic congestive heart failure (EF 20-25%), hyperlipidemia, who presents with recurrent abdominal pain. He was recently admitted to the hospital for sbo and CAP and was discharged on 8/2. This morning he reported recurrent ab pain, reported last bm this am, denies n/v. He was sent from the SNF to the ED, abdominal xray showed Bowel gas pattern is concerning for a degree of obstruction given  Pt seen for MST. BMI indicates underweight status. Pt has been NPO since admission and unable to meet needs.  He is very HOH at time of visit. He states good appetite between current admission and recent d/c from hospital but unable to obtain further information on this. He denies chewing/swallowing difficulty despite noted missing teeth. When asked if he is experiencing abdominal pain or nausea he states "my stomach feels great, I'm starving."  Severe muscle and fat wasting noted. Per weight hx review, past lost 5 lbs (4% body weight) in the past month which is not significant for time frame.  Medications reviewed. Labs reviewed; CBGs: 97-144 mg/dL, creatinine low, Ca: 8.1 mg/dL.  Diet Order:  NPO  Skin:  Reviewed, no issues  Last BM:  PTA  Height:   Ht Readings from Last 1 Encounters:  05/02/15 5\' 9"  (1.753 m)    Weight:   Wt Readings from Last 1  Encounters:  05/02/15 113 lb 5.1 oz (51.4 kg)    Ideal Body Weight:  72.73 kg (kg)  BMI:  Body mass index is 16.73 kg/(m^2).  Estimated Nutritional Needs:   Kcal:  1500-1700  Protein:  70-80 grams  Fluid:  1.8-2.2 L/day  EDUCATION NEEDS:   No education needs identified at this time     Jarome Matin, RD, LDN Inpatient Clinical Dietitian Pager # [phone] After hours/weekend pager # [phone]

## 2015-05-04 ENCOUNTER — Inpatient Hospital Stay (HOSPITAL_COMMUNITY): Payer: Medicaid Other

## 2015-05-04 LAB — BASIC METABOLIC PANEL
ANION GAP: 6 (ref 5–15)
BUN: 6 mg/dL (ref 6–20)
CALCIUM: 7.9 mg/dL — AB (ref 8.9–10.3)
CO2: 25 mmol/L (ref 22–32)
Chloride: 105 mmol/L (ref 101–111)
Creatinine, Ser: 0.57 mg/dL — ABNORMAL LOW (ref 0.61–1.24)
GFR calc Af Amer: >60 mL/min (ref >60)
Glucose, Bld: 111 mg/dL — ABNORMAL HIGH (ref 65–99)
POTASSIUM: 4.1 mmol/L (ref 3.5–5.1)
Sodium: 136 mmol/L (ref 135–145)

## 2015-05-04 LAB — CBC
HEMATOCRIT: 36.1 % — AB (ref 39.0–52.0)
Hemoglobin: 11.6 g/dL — ABNORMAL LOW (ref 13.0–17.0)
MCH: 29.7 pg (ref 26.0–34.0)
MCHC: 32.1 g/dL (ref 30.0–36.0)
MCV: 92.3 fL (ref 78.0–100.0)
PLATELETS: 188 10*3/uL (ref 150–400)
RBC: 3.91 MIL/uL — ABNORMAL LOW (ref 4.22–5.81)
RDW: 13.1 % (ref 11.5–15.5)
WBC: 8.7 10*3/uL (ref 4.0–10.5)

## 2015-05-04 LAB — MAGNESIUM: MAGNESIUM: 1.6 mg/dL — AB (ref 1.7–2.4)

## 2015-05-04 MED ORDER — POLYETHYLENE GLYCOL 3350 17 G PO PACK: 17.0000 g | PACK | Freq: Every day | ORAL | Status: DC

## 2015-05-04 MED ORDER — MAGNESIUM SULFATE 2 GM/50ML IV SOLN
2.0000 g | Freq: Once | INTRAVENOUS | Status: AC
  Administered 2015-05-04: 2 g via INTRAVENOUS
  Filled 2015-05-04: qty 50

## 2015-05-04 NOTE — Progress Notes (Signed)
Subjective: Had large bm, having flatus, no n/v, contrast in colon on xray  Objective: Vital signs in last 24 hours: Temp:  [98.4 F (36.9 C)-98.8 F (37.1 C)] 98.4 F (36.9 C) (08/06 0452) Pulse Rate:  [84-92] 84 (08/06 0452) Resp:  [20] 20 (08/06 0452) BP: (93-105)/(69-76) 105/69 mmHg (08/06 0452) SpO2:  [93 %-99 %] 93 % (08/06 0835) Weight:  [47.4 kg (104 lb 8 oz)] 47.4 kg (104 lb 8 oz) (08/06 0629) Last BM Date: 05/04/15  Intake/Output from previous day: 08/05 0701 - 08/06 0700 In: 2110 [P.O.:960; I.V.:1150] Out: -  Intake/Output this shift: Total I/O In: 480 [P.O.:480] Out: -   GI: soft nt/nd  Lab Results:   Recent Labs  05/03/15 0400 05/04/15 0425  WBC 10.4 8.7  HGB 12.4* 11.6*  HCT 36.9* 36.1*  PLT 196 188   BMET  Recent Labs  05/03/15 0400 05/04/15 0425  NA 137 136  K 4.0 4.1  CL 106 105  CO2 26 25  GLUCOSE 97 111*  BUN 9 6  CREATININE 0.57* 0.57*  CALCIUM 8.1* 7.9*   PT/INR  Recent Labs  05/03/15 0400  LABPROT 14.2  INR 1.08   ABG No results for input(s): PHART, HCO3 in the last 72 hours.  Invalid input(s): PCO2, PO2  Studies/Results: Dg Abd 1 View  05/04/2015   CLINICAL DATA:  64 year old male with abdominal pain and weakness.  EXAM: ABDOMEN - 1 VIEW  COMPARISON:  CT of the abdomen and pelvis 05/02/2015. Abdominal radiographs 05/02/2015.  FINDINGS: While there is a single mildly dilated loop of what appears to be small bowel in the left central abdomen measuring up to 4.7 cm, this is a isolated finding. There is gas, stool and oral contrast material throughout the colon and rectum. No gross evidence of pneumoperitoneum on this single supine view the abdomen.  IMPRESSION: 1. Nonspecific bowel gas pattern, which could suggest mild residual partial small bowel obstruction. However, oral contrast administered for CT of the abdomen and pelvis 05/02/2015 has traversed into the colon.   Electronically Signed   By: Vinnie Langton M.D.   On:  05/04/2015 09:18   Ct Abdomen Pelvis W Contrast  05/02/2015   CLINICAL DATA:  Complaint of right lower abdominal pain. The patient was seen on Friday in discharged on Sunday. No bowel movement since admission. Pain began yesterday. Previous appendectomy.  EXAM: CT ABDOMEN AND PELVIS WITH CONTRAST  TECHNIQUE: Multidetector CT imaging of the abdomen and pelvis was performed using the standard protocol following bolus administration of intravenous contrast.  CONTRAST:  69mL OMNIPAQUE IOHEXOL 300 MG/ML  SOLN  COMPARISON:  05/02/2015 and 04/26/2015  FINDINGS: Lower chest: Heart size is normal. No imaged pericardial effusion or significant coronary artery calcifications. No pulmonary nodules, pleural effusions, or infiltrates.  Upper abdomen: No focal abnormality identified within the liver, spleen, pancreas, or right adrenal gland. Left adrenal mass is 4.2 cm and consistent with benign adenoma based on previous MRI characterization. Small left renal cyst in the midpole region. There is symmetric bilateral renal excretion. The gallbladder is present and is contracted.  Gastrointestinal tract: The stomach has a normal appearance. There has been significant improvement in the appearance of numerous dilated loops of small bowel. Some of the contrast filled loops continued to be somewhat dilated without in discrete transition zone or mass identified. Colonic loops are distended with stool. There are scattered colonic diverticula. The rectosigmoid colon shows mildly thickened wall, nonspecific in appearance.  Pelvis: There is a small  amount of free pelvic fluid. Urinary bladder is distended. Prostate gland in is prominent in size.  Retroperitoneum: Atherosclerotic calcification and mural thrombus involving the abdominal aorta and iliac arteries.  Abdominal wall: Unremarkable.  Osseous structures: No suspicious lytic or blastic lesions are identified. Mild spondylosis in the lower lumbar spine.  IMPRESSION: 1. There has been  significant improvement in the dilatation of small bowel loops. However there is persistent significant small bowel dilatation without point of obstruction or mass identified. 2. Small amount of free pelvic fluid. 3. Significant stool burden. 4. Nonspecific thickening of the sigmoid colon and rectum, not associated with inflammation. 5. No free intraperitoneal air. 6. Stable left adrenal adenoma. 7. Atherosclerosis of the abdominal aorta.   Electronically Signed   By: Nolon Nations M.D.   On: 05/02/2015 13:02   Dg Abd Acute W/chest  05/02/2015   CLINICAL DATA:  One day history of lower abdominal pain with nausea and shortness of breath  EXAM: DG ABDOMEN ACUTE W/ 1V CHEST  COMPARISON:  Abdomen series April 26, 2015; supine abdomen April 29, 2015  FINDINGS: PA chest: Lungs are hyperexpanded. There is patchy scarring in the bases. Currently no edema or consolidation. The heart size is normal. There is diminished pulmonary vascularity in the upper lobes suggesting potential upper lobe bullous disease. No adenopathy.  Supine and upright abdomen: There are loops of dilated small bowel with multiple air-fluid levels. There is air in the rectum. No free air. There is moderate stool throughout the colon.  IMPRESSION: Bowel gas pattern is concerning for a degree of obstruction given mild small bowel dilatation in multiple air-fluid levels. Air is noted in the rectum. It should be noted that ileus or enteritis potentially could present similarly and are differential considerations. No free air.  Suspect a degree of underlying COPD. No lung edema or consolidation. There are scattered areas of scarring in the lower lung regions.   Electronically Signed   By: Lowella Grip III M.D.   On: 05/02/2015 10:36    Anti-infectives: Anti-infectives    Start     Dose/Rate Route Frequency Ordered Stop   05/02/15 1700  levofloxacin (LEVAQUIN) IVPB 750 mg     750 mg 100 mL/hr over 90 Minutes Intravenous Every 24 hours 05/02/15  1638 05/03/15 1858      Assessment/Plan: sbo vs constipation  Resolved, can dc today, soft diet   Hilberto Burzynski 05/04/2015

## 2015-05-04 NOTE — Clinical Social Work Note (Signed)
CSW received a call that pt needed transport home today  CSW met with pt at bedside to confirm address and confirm that pt wanted to go home and not to a SNF  Pt stated that he wanted to go home and confirmed his address  CSW provided taxi voucher to RN  No further CSW needs  .Dede Query, LCSW [facility] Clinical Social Worker - Weekend Coverage cell #: [phone]

## 2015-05-04 NOTE — Progress Notes (Signed)
Pt discharge home. Cab voucher given. [employer] called to pick up patient and take him to address on file which he has confirmed. Pt taken down with tech via wheel chair and placed in taxi cab. Pt alert and oriented. Ambulatory with steady gait. All property with patient.

## 2015-05-04 NOTE — Progress Notes (Signed)
Received report on patient this morning. Pt alert and oriented , hard of hearing. Pt ate breakfast this morning and tolerated same well. Denies abdominal pain. No nausea or vomiting. Last BM this morning. Pt passing gas. Repeat xray of abdomen completed. Pt continues on IVF hydration. Pt for possible discharge to home or snif today, prefers home. No other concerns or needs at this time.

## 2015-05-04 NOTE — Progress Notes (Signed)
Pt has orders to discharge to snif once bed is available. Initially was in agreement with plans once he is able to get his personal items from his room at [address], here in [place]. I explained to pt that we would contact security to see if they are able to pick up his personal items and bring them here but they would need a key. Pt responded,  "I am going home. They can't pick up all my stuff. Just give me a bus pass or cab and I will go". Pt also states he has no one to take care of him, but he he has food. Dr. Erlinda Hong paged with message that pt would like to go home. Social worker notified.

## 2015-05-04 NOTE — Progress Notes (Signed)
Spoke with Dr. Erlinda Hong about pt placement. She was under the impression that pt came from nursing home. Per social worker Rollene Fare, pt refused placement last time he was here and yelled at the Education officer, museum. Pt insisting he wants to go home and not to a facility. Social worker provided a cab pass. Pt to be discharge home to self care.

## 2015-05-04 NOTE — Discharge Summary (Addendum)
Discharge Summary  Anthony Bolton IDP:[id number] DOB: 07-12-1951  PCP: [name], MD  Admit date: 05/02/2015 Discharge date: 05/04/2015  Time spent: <19mins  Recommendations for Outpatient Follow-up:  1. F/u with PMD in a week for hospital discharge follow up 2. Patient was supposed to be discharged to SNF from previous admission, he refused. Today, he refused to be discharged to snf again.  Discharge Diagnoses:  Active Hospital Problems   Diagnosis Date Noted  . Small bowel obstruction 05/02/2015  . SBO (small bowel obstruction) 04/26/2015    Resolved Hospital Problems   Diagnosis Date Noted Date Resolved  No resolved problems to display.    Discharge Condition: stable  Diet recommendation: heart healthy  Filed Weights   05/02/15 0944 05/02/15 1532 05/04/15 0629  Weight: 49.896 kg (110 lb) 51.4 kg (113 lb 5.1 oz) 47.4 kg (104 lb 8 oz)    History of present illness:  Anthony Bolton is a 64 y.o. male with PMH of hyperlipidemia COPD, severe hearing loss, systolic congestive heart failure (EF 20-25%), hyperlipidemia, who presents with recurrent abdominal pain. He was recently admitted to the hospital for sbo and CAP and was discharged on 8/2. This morning he reported recurrent ab pain, reported last bm this am, denies n/v. He was sent from the SNF to the ED, abdominal xray showed Bowel gas pattern is concerning for a degree of obstruction given mild small bowel dilatation in multiple air-fluid levels. Air is noted in the rectum. It should be noted that ileus or enteritis potentially could present similarly and are differential considerations. No free air.  General surgery consulted by EDP, recommended kept patient npo, admit to hosptialist service. Currently patient report ab pain, has resolved, has been passing gas, denies n/v, denies sob. He is very hard of hearing and very agitated, not sure if he can provide reliable answer.  Hospital Course:  Active Problems:  SBO (small bowel obstruction)   Small bowel obstruction  Recurrent SBO:  resolved with bowel rest, ivf, no ab pain,no n/v. Had bm. He received fleet enema in the hospital as well, he is discharged on stool softener. Tolerating diet advancement, repeat ab xray contrast reach to colon,  Cleared by General surgery for discharge.  Recently treated for CAP, kub did not review infiltrate, finished levaquin  course of abx. No cough , no wheezing, no leukocytosis at discharge.  COPD stable, continue home meds. No wheezing  Chronic systolic CHF: euvolemic to dry, close monitor volume status. Not on lasix at home.  Severe malnutrition: nutrition consult obtained in the hospital, nutrition supplement provided.   Consultants: general surgery  Code Status: full   Family Communication: Patient    Disposition Plan: return to SNF    Consultants:  General surgery  Procedures:  none  Antibiotics:  none  Discharge Exam: BP 105/69 mmHg  Pulse 84  Temp(Src) 98.4 F (36.9 C) (Oral)  Resp 20  Ht 5\' 9"  (1.753 m)  Wt 47.4 kg (104 lb 8 oz)  BMI 15.42 kg/m2  SpO2 93%   General: NAD, angry   Cardiovascular: RRR  Respiratory: CTABL  Abdomen: Soft/ND/NT, positive BS  Musculoskeletal: No Edema  Neuro: angry, not cooperative with exam, no obvious focal deficit, very hard of hearing   Discharge Instructions You were cared for by a hospitalist during your hospital stay. If you have any questions about your discharge medications or the care you received while you were in the hospital after you are discharged, you can call the unit  and asked to speak with the hospitalist on call if the hospitalist that took care of you is not available. Once you are discharged, your primary care physician will handle any further medical issues. Please note that NO REFILLS for any discharge medications will be authorized once you are discharged, as it is imperative that you return to your primary care  physician (or establish a relationship with a primary care physician if you do not have one) for your aftercare needs so that they can reassess your need for medications and monitor your lab values.  Discharge Instructions    Diet - low sodium heart healthy    Complete by:  As directed      Increase activity slowly    Complete by:  As directed             Medication List    STOP taking these medications        levofloxacin 500 MG tablet  Commonly known as:  LEVAQUIN     predniSONE 10 MG tablet  Commonly known as:  DELTASONE      TAKE these medications        albuterol (2.5 MG/3ML) 0.083% nebulizer solution  Commonly known as:  PROVENTIL  Take 3 mLs (2.5 mg total) by nebulization every 2 (two) hours as needed for wheezing or shortness of breath.     PROAIR HFA 108 (90 BASE) MCG/ACT inhaler  Generic drug:  albuterol  INHALE 2 PUFFS EVERY 4 HOURS AS NEEDED FOR WHEEZING OR SHORTNESS OF BREATH     aspirin 81 MG EC tablet  Take 1 tablet (81 mg total) by mouth daily.     atorvastatin 40 MG tablet  Commonly known as:  LIPITOR  Take 1 tablet (40 mg total) by mouth at bedtime.     budesonide-formoterol 160-4.5 MCG/ACT inhaler  Commonly known as:  SYMBICORT  Inhale 2 puffs into the lungs 2 (two) times daily.     [employer] SLEEP AID 5 MG Tabs  Generic drug:  Melatonin  TAKE10 MG BY MOUTH 30 MIN BEFORE BED TIME.     feeding supplement (ENSURE ENLIVE) Liqd  Take 237 mLs by mouth 2 (two) times daily between meals.     guaiFENesin 600 MG 12 hr tablet  Commonly known as:  MUCINEX  Take 2 tablets (1,200 mg total) by mouth 2 (two) times daily.     ipratropium-albuterol 0.5-2.5 (3) MG/3ML Soln  Commonly known as:  DUONEB  Take 3 mLs by nebulization every 6 (six) hours.     multivitamin with minerals Tabs tablet  Take 1 tablet by mouth daily.     polyethylene glycol packet  Commonly known as:  MIRALAX / GLYCOLAX  Take 17 g by mouth daily.     SPIRIVA HANDIHALER 18 MCG inhalation  capsule  Generic drug:  tiotropium  INHALE CONTENTS OF 1 CAPSULE USING HANDIHALER EVERY DAY FOR BREATHING     traZODone 50 MG tablet  Commonly known as:  DESYREL  Take 50 mg by mouth at bedtime.       No Known Allergies    The results of significant diagnostics from this hospitalization (including imaging, microbiology, ancillary and laboratory) are listed below for reference.    Significant Diagnostic Studies: Ct Abdomen Pelvis Wo Contrast  04/26/2015   CLINICAL DATA:  Left abdominal pain for 9 days.  Nausea, vomiting.  EXAM: CT ABDOMEN AND PELVIS WITHOUT CONTRAST  TECHNIQUE: Multidetector CT imaging of the abdomen and pelvis was performed following the  standard protocol without IV contrast.  COMPARISON:  None.  FINDINGS: Lung bases are clear.  No effusions.  Heart is normal size.  Stomach and the proximal to mid small bowel loops are markedly dilated compatible with high grade small bowel obstruction. Exact transition is not identified. Mid to distal small bowel loops and colon are decompressed. Moderate stool burden throughout the colon. No free fluid, free air or adenopathy.  Liver, gallbladder, spleen, pancreas, right adrenal and kidneys are unremarkable. Heterogeneous mass within the left adrenal gland measures 4.2 cm, not significantly changed since prior MRI when this was felt to most likely reflect partially necrotic at adenoma.  Aortic and iliac calcifications. No aneurysm. No acute bony abnormality or focal bone lesion.  IMPRESSION: Markedly dilated stomach and proximal to mid small bowel loops compatible with high-grade partial small bowel obstruction. Exact transition point or cause not identified.  Moderate stool burden throughout the colon.  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   Electronically Signed   By: Rolm Baptise M.D.   On: 04/26/2015 20:45   Dg Chest 2 View  04/26/2015   CLINICAL DATA:  Left-sided abdominal pain 9 days with nausea and vomiting.   EXAM: CHEST  2 VIEW  COMPARISON:  04/06/2015  FINDINGS: Lungs are adequately inflated with mild patchy opacification in the medial right base as this may be due to atelectasis or early infection. No evidence of effusion. Cardiomediastinal silhouette and remainder of the exam is unchanged.  IMPRESSION: Mild patchy opacification in the medial right base which may be due to atelectasis or early infection.   Electronically Signed   By: Marin Olp M.D.   On: 04/26/2015 20:35   Dg Chest 2 View  04/06/2015   CLINICAL DATA:  Chest pain.  EXAM: CHEST  2 VIEW  COMPARISON:  April 01, 2015.  FINDINGS: The heart size and mediastinal contours are within normal limits. Both lungs are clear. No pneumothorax or pleural effusion is noted. Hyperexpansion of the lungs is noted consistent with chronic obstructive pulmonary disease. Old distal left clavicular fracture is noted.  IMPRESSION: Hyperexpansion of the lungs consistent with chronic obstructive pulmonary disease. No acute cardiopulmonary abnormality seen.   Electronically Signed   By: Marijo Conception, M.D.   On: 04/06/2015 21:00   Dg Abd 1 View  05/04/2015   CLINICAL DATA:  64 year old male with abdominal pain and weakness.  EXAM: ABDOMEN - 1 VIEW  COMPARISON:  CT of the abdomen and pelvis 05/02/2015. Abdominal radiographs 05/02/2015.  FINDINGS: While there is a single mildly dilated loop of what appears to be small bowel in the left central abdomen measuring up to 4.7 cm, this is a isolated finding. There is gas, stool and oral contrast material throughout the colon and rectum. No gross evidence of pneumoperitoneum on this single supine view the abdomen.  IMPRESSION: 1. Nonspecific bowel gas pattern, which could suggest mild residual partial small bowel obstruction. However, oral contrast administered for CT of the abdomen and pelvis 05/02/2015 has traversed into the colon.   Electronically Signed   By: Vinnie Langton M.D.   On: 05/04/2015 09:18   Dg Abd 1  View  04/29/2015   CLINICAL DATA:  Small bowel obstruction.  Continued surveillance.  EXAM: ABDOMEN - 1 VIEW  COMPARISON:  04/28/2015.  FINDINGS: Nasogastric tube has been removed. The small bowel gaseous distention noted on yesterday's radiograph is diminished, and there is increasing colonic gas suggesting resolving small bowel obstruction.  IMPRESSION: Improved bowel gas pattern.   Electronically Signed   By: Staci Righter M.D.   On: 04/29/2015 09:57   Ct Abdomen Pelvis W Contrast  05/02/2015   CLINICAL DATA:  Complaint of right lower abdominal pain. The patient was seen on Friday in discharged on Sunday. No bowel movement since admission. Pain began yesterday. Previous appendectomy.  EXAM: CT ABDOMEN AND PELVIS WITH CONTRAST  TECHNIQUE: Multidetector CT imaging of the abdomen and pelvis was performed using the standard protocol following bolus administration of intravenous contrast.  CONTRAST:  77mL OMNIPAQUE IOHEXOL 300 MG/ML  SOLN  COMPARISON:  05/02/2015 and 04/26/2015  FINDINGS: Lower chest: Heart size is normal. No imaged pericardial effusion or significant coronary artery calcifications. No pulmonary nodules, pleural effusions, or infiltrates.  Upper abdomen: No focal abnormality identified within the liver, spleen, pancreas, or right adrenal gland. Left adrenal mass is 4.2 cm and consistent with benign adenoma based on previous MRI characterization. Small left renal cyst in the midpole region. There is symmetric bilateral renal excretion. The gallbladder is present and is contracted.  Gastrointestinal tract: The stomach has a normal appearance. There has been significant improvement in the appearance of numerous dilated loops of small bowel. Some of the contrast filled loops continued to be somewhat dilated without in discrete transition zone or mass identified. Colonic loops are distended with stool. There are scattered colonic diverticula. The rectosigmoid colon shows mildly thickened wall,  nonspecific in appearance.  Pelvis: There is a small amount of free pelvic fluid. Urinary bladder is distended. Prostate gland in is prominent in size.  Retroperitoneum: Atherosclerotic calcification and mural thrombus involving the abdominal aorta and iliac arteries.  Abdominal wall: Unremarkable.  Osseous structures: No suspicious lytic or blastic lesions are identified. Mild spondylosis in the lower lumbar spine.  IMPRESSION: 1. There has been significant improvement in the dilatation of small bowel loops. However there is persistent significant small bowel dilatation without point of obstruction or mass identified. 2. Small amount of free pelvic fluid. 3. Significant stool burden. 4. Nonspecific thickening of the sigmoid colon and rectum, not associated with inflammation. 5. No free intraperitoneal air. 6. Stable left adrenal adenoma. 7. Atherosclerosis of the abdominal aorta.   Electronically Signed   By: Nolon Nations M.D.   On: 05/02/2015 13:02   Dg Abd 2 Views  04/27/2015   CLINICAL DATA:  Abdominal pain, nausea/vomiting, follow-up small bowel obstruction  EXAM: ABDOMEN - 2 VIEW  COMPARISON:  CT abdomen pelvis dated 04/26/2015  FINDINGS: Multiple dilated loops of small bowel throughout the abdomen, compatible with small bowel obstruction.  No evidence of free air on the lateral decubitus view.  Visualized osseous structures are within normal limits.  IMPRESSION: Multiple dilated loops of small bowel throughout the abdomen, compatible small bowel obstruction.  No free air.   Electronically Signed   By: Julian Hy M.D.   On: 04/27/2015 09:17   Dg Abd Acute W/chest  05/02/2015   CLINICAL DATA:  One day history of lower abdominal pain with nausea and shortness of breath  EXAM: DG ABDOMEN ACUTE W/ 1V CHEST  COMPARISON:  Abdomen series April 26, 2015; supine abdomen April 29, 2015  FINDINGS: PA chest: Lungs are hyperexpanded. There is patchy scarring in the bases. Currently no edema or consolidation.  The heart size is normal. There is diminished pulmonary vascularity in the upper lobes suggesting potential upper lobe bullous disease. No adenopathy.  Supine and upright abdomen: There are loops of dilated small bowel with multiple  air-fluid levels. There is air in the rectum. No free air. There is moderate stool throughout the colon.  IMPRESSION: Bowel gas pattern is concerning for a degree of obstruction given mild small bowel dilatation in multiple air-fluid levels. Air is noted in the rectum. It should be noted that ileus or enteritis potentially could present similarly and are differential considerations. No free air.  Suspect a degree of underlying COPD. No lung edema or consolidation. There are scattered areas of scarring in the lower lung regions.   Electronically Signed   By: Lowella Grip III M.D.   On: 05/02/2015 10:36   Dg Abd Portable 1v  04/28/2015   CLINICAL DATA:  Followup small bowel obstruction.  EXAM: PORTABLE ABDOMEN - 1 VIEW  COMPARISON:  04/27/2015  FINDINGS: Nasogastric tube tip remains in the proximal stomach. Markedly dilated small bowel loops in the right abdomen and pelvis show mildly increased dilatation compared to previous study. There is some gas and stool remaining in the transverse and left colon. This is consistent with a high-grade partial small bowel obstruction.  IMPRESSION: High-grade partial small bowel obstruction, with mild increase in degree of small bowel dilatation since prior study.   Electronically Signed   By: Earle Gell M.D.   On: 04/28/2015 10:12    Microbiology: Recent Results (from the past 240 hour(s))  Culture, blood (x 2)     Status: None   Collection Time: 04/27/15 12:00 AM  Result Value Ref Range Status   Specimen Description BLOOD LEFT ANTECUBITAL  Final   Special Requests BOTTLES DRAWN AEROBIC ONLY 5CC  Final   Culture   Final    NO GROWTH 5 DAYS Performed at [facility]    Report Status 05/02/2015 FINAL  Final  Culture, blood  (x 2)     Status: None   Collection Time: 04/27/15 12:00 AM  Result Value Ref Range Status   Specimen Description BLOOD BLOOD LEFT HAND  Final   Special Requests BOTTLES DRAWN AEROBIC ONLY 5ML  Final   Culture   Final    NO GROWTH 5 DAYS Performed at [facility]    Report Status 05/02/2015 FINAL  Final     Labs: Basic Metabolic Panel:  Recent Labs Lab 05/02/15 1056 05/03/15 0400 05/04/15 0425  NA 136 137 136  K 3.8 4.0 4.1  CL 102 106 105  CO2 27 26 25   GLUCOSE 104* 97 111*  BUN 13 9 6   CREATININE 0.62 0.57* 0.57*  CALCIUM 8.5* 8.1* 7.9*  MG  --   --  1.6*   Liver Function Tests:  Recent Labs Lab 05/02/15 1056 05/03/15 0400  AST 22 15  ALT 25 20  ALKPHOS 42 42  BILITOT 0.4 0.5  PROT 5.5* 4.8*  ALBUMIN 3.2* 2.7*    Recent Labs Lab 05/02/15 1056  LIPASE 24   No results for input(s): AMMONIA in the last 168 hours. CBC:  Recent Labs Lab 05/02/15 1056 05/03/15 0400 05/04/15 0425  WBC 14.0* 10.4 8.7  NEUTROABS 11.4*  --   --   HGB 12.8* 12.4* 11.6*  HCT 38.9* 36.9* 36.1*  MCV 95.1 93.4 92.3  PLT 181 196 188   Cardiac Enzymes: No results for input(s): CKTOTAL, CKMB, CKMBINDEX, TROPONINI in the last 168 hours. BNP: BNP (last 3 results)  Recent Labs  03/25/15 1839 04/06/15 2101 04/27/15 0213  BNP 145.2* 67.9 28.1    ProBNP (last 3 results) No results for input(s): PROBNP in the last 8760 hours.  CBG:  Recent Labs Lab 04/28/15 0734 04/29/15 0743  GLUCAP 97 100*       SignedFlorencia Reasons MD, PhD  [facility] 05/04/2015, 9:56 AM

## 2015-05-08 ENCOUNTER — Emergency Department (HOSPITAL_COMMUNITY)
Admission: EM | Admit: 2015-05-08 | Discharge: 2015-05-08 | Disposition: A | Discharge status: Home / Self Care | Payer: Medicaid Other | Attending: Emergency Medicine | Admitting: Emergency Medicine

## 2015-05-08 ENCOUNTER — Encounter (HOSPITAL_COMMUNITY): Payer: Self-pay | Admitting: Emergency Medicine

## 2015-05-08 ENCOUNTER — Emergency Department (HOSPITAL_COMMUNITY): Payer: Medicaid Other

## 2015-05-08 DIAGNOSIS — K5900 Constipation, unspecified: Secondary | ICD-10-CM | POA: Diagnosis not present

## 2015-05-08 DIAGNOSIS — R1084 Generalized abdominal pain: Secondary | ICD-10-CM | POA: Diagnosis not present

## 2015-05-08 DIAGNOSIS — Z720 Tobacco use: Secondary | ICD-10-CM | POA: Diagnosis not present

## 2015-05-08 DIAGNOSIS — Z7951 Long term (current) use of inhaled steroids: Secondary | ICD-10-CM | POA: Diagnosis not present

## 2015-05-08 DIAGNOSIS — Z7982 Long term (current) use of aspirin: Secondary | ICD-10-CM | POA: Insufficient documentation

## 2015-05-08 DIAGNOSIS — I509 Heart failure, unspecified: Secondary | ICD-10-CM | POA: Diagnosis not present

## 2015-05-08 DIAGNOSIS — Z9861 Coronary angioplasty status: Secondary | ICD-10-CM | POA: Diagnosis not present

## 2015-05-08 DIAGNOSIS — Z8701 Personal history of pneumonia (recurrent): Secondary | ICD-10-CM | POA: Insufficient documentation

## 2015-05-08 DIAGNOSIS — H9190 Unspecified hearing loss, unspecified ear: Secondary | ICD-10-CM | POA: Diagnosis not present

## 2015-05-08 DIAGNOSIS — R0602 Shortness of breath: Secondary | ICD-10-CM | POA: Diagnosis present

## 2015-05-08 DIAGNOSIS — Z79899 Other long term (current) drug therapy: Secondary | ICD-10-CM | POA: Insufficient documentation

## 2015-05-08 DIAGNOSIS — E785 Hyperlipidemia, unspecified: Secondary | ICD-10-CM | POA: Diagnosis not present

## 2015-05-08 DIAGNOSIS — Z9889 Other specified postprocedural states: Secondary | ICD-10-CM | POA: Diagnosis not present

## 2015-05-08 DIAGNOSIS — R110 Nausea: Secondary | ICD-10-CM | POA: Insufficient documentation

## 2015-05-08 DIAGNOSIS — J441 Chronic obstructive pulmonary disease with (acute) exacerbation: Secondary | ICD-10-CM | POA: Diagnosis not present

## 2015-05-08 MED ORDER — IPRATROPIUM-ALBUTEROL 0.5-2.5 (3) MG/3ML IN SOLN: 3.0000 mL | Freq: Four times a day (QID) | RESPIRATORY_TRACT | Status: DC

## 2015-05-08 MED ORDER — POLYETHYLENE GLYCOL 3350 17 G PO PACK: 17.0000 g | PACK | Freq: Every day | ORAL | Status: DC

## 2015-05-08 MED ORDER — ONDANSETRON 4 MG PO TBDP
4.0000 mg | ORAL_TABLET | Freq: Once | ORAL | Status: AC
  Administered 2015-05-08: 4 mg via ORAL
  Filled 2015-05-08: qty 1

## 2015-05-08 NOTE — ED Notes (Signed)
Pt tolerating [place] sandwich and applesauce

## 2015-05-08 NOTE — Discharge Instructions (Signed)
Bloating Bloating is the feeling of fullness in your belly. You may feel as though your pants are too tight. Often the cause of bloating is overeating, retaining fluids, or having gas in your bowel. It is also caused by swallowing air and eating foods that cause gas. Irritable bowel syndrome is one of the most common causes of bloating. Constipation is also a common cause. Sometimes more serious problems can cause bloating. SYMPTOMS  Usually there is a feeling of fullness, as though your abdomen is bulged out. There may be mild discomfort.  DIAGNOSIS  Usually no particular testing is necessary for most bloating. If the condition persists and seems to become worse, your caregiver may do additional testing.  TREATMENT   There is no direct treatment for bloating.  Do not put gas into the bowel. Avoid chewing gum and sucking on candy. These tend to make you swallow air. Swallowing air can also be a nervous habit. Try to avoid this.  Avoiding high residue diets will help. Eat foods with soluble fibers (examples include root vegetables, apples, or barley) and substitute dairy products with soy and rice products. This helps irritable bowel syndrome.  If constipation is the cause, then a high residue diet with more fiber will help.  Avoid carbonated beverages.  Over-the-counter preparations are available that help reduce gas. Your pharmacist can help you with this. SEEK MEDICAL CARE IF:   Bloating continues and seems to be getting worse.  You notice a weight gain.  You have a weight loss but the bloating is getting worse.  You have changes in your bowel habits or develop nausea or vomiting. SEEK IMMEDIATE MEDICAL CARE IF:   You develop shortness of breath or swelling in your legs.  You have an increase in abdominal pain or develop chest pain. Document Released: 07/15/2006 Document Revised: 12/07/2011 Document Reviewed: 09/02/2007 Exit[employer] Patient Information 2015 [employer]. This  information is not intended to replace advice given to you by your health care provider. Make sure you discuss any questions you have with your health care provider.  

## 2015-05-08 NOTE — ED Notes (Signed)
While ambulating, pt's O2 sats stayed between 95% and 97%.

## 2015-05-08 NOTE — ED Provider Notes (Signed)
CSN: [id number]     Arrival date & time 05/08/15  1417 History   First MD Initiated Contact with Patient 05/08/15 1417     Chief Complaint  Patient presents with  . Shortness of Breath     (Consider location/radiation/quality/duration/timing/severity/associated sxs/prior Treatment) HPI   64 year old male with history of COPD, CHF, and hard of hearing brought here via EMS from home to ER for evaluation of shortness of breath. Patient was recently diagnosed on 04/30/15 with diagnosis of community acquired pneumonia and was treated with Levaquin. Also admitted for small bowel obstruction 4 days ago and discharged 2 days ago. He has a long history of COPD and shortness of breath which he uses nebs treatment at home. Today he was using his nebs treatment all day, when his machine stopped working, patient then called EMS. Patient states he uses nebulizer every 4 hours daily. This is the first time of his never lets machine does not work. He did attempt to contact the company but was unable to get in touch with anyone. Patient felt that his shortness of breath is due to not having his. He endorses occasional nonproductive cough but denies fever or chills.  Furthermore patient also complaining of abdominal discomfort. He mentioned that he has been having intermittent generalized abdominal pain since he was discharge. He ate a bowl barbecue today which worsening his pain. Felt nauseous without vomiting or diarrhea. He did have a bowel movement early in the day. States that he has to go to court today because his landlord wants to evict him however he missed court and he was having abdominal discomfort. He also requesting for coffee to drink. He is hard of hearing and history is difficult to obtain.  EMS noted that there was bed bugs at this facility. However patient denies having bedbugs and denies having any rash or itchiness.  Past Medical History  Diagnosis Date  . COPD (chronic obstructive pulmonary  disease)   . HOH (hard of hearing)   . CHF (congestive heart failure) EF 20 - 25% in 2015  . HLD (hyperlipidemia)    Past Surgical History  Procedure Laterality Date  . Appendectomy    . Left heart catheterization with coronary angiogram N/A 11/07/2013    Procedure: LEFT HEART CATHETERIZATION WITH CORONARY ANGIOGRAM;  Surgeon: Clent Demark, MD;  Location: [facility] CATH LAB;  Service: Cardiovascular;  Laterality: N/A;  . Cardiac stents      No stent history   Family History  Problem Relation Age of Onset  . Stroke Mother    Social History  Substance Use Topics  . Smoking status: Current Every Day Smoker    Types: Cigarettes  . Smokeless tobacco: Never Used  . Alcohol Use: No    Review of Systems  All other systems reviewed and are negative.     Allergies  Review of patient's allergies indicates no known allergies.  Home Medications   Prior to Admission medications   Medication Sig Start Date End Date Taking? Authorizing Provider  albuterol (PROVENTIL) (2.5 MG/3ML) 0.083% nebulizer solution Take 3 mLs (2.5 mg total) by nebulization every 2 (two) hours as needed for wheezing or shortness of breath. 03/04/15   Maryann Mikhail, DO  aspirin EC 81 MG EC tablet Take 1 tablet (81 mg total) by mouth daily. 03/04/15   Maryann Mikhail, DO  atorvastatin (LIPITOR) 40 MG tablet Take 1 tablet (40 mg total) by mouth at bedtime. 03/04/15   Maryann Mikhail, DO  budesonide-formoterol (SYMBICORT) 160-4.5 MCG/ACT  inhaler Inhale 2 puffs into the lungs 2 (two) times daily.    Historical Provider, MD  [employer] SLEEP AID 5 MG TABS TAKE10 MG BY MOUTH 30 MIN BEFORE BED TIME. 04/18/15   Historical Provider, MD  feeding supplement, ENSURE ENLIVE, (ENSURE ENLIVE) LIQD Take 237 mLs by mouth 2 (two) times daily between meals. Patient not taking: Reported on 03/13/2015 03/04/15   Velta Addison Mikhail, DO  guaiFENesin (MUCINEX) 600 MG 12 hr tablet Take 2 tablets (1,200 mg total) by mouth 2 (two) times daily. 03/04/15   Maryann  Mikhail, DO  ipratropium-albuterol (DUONEB) 0.5-2.5 (3) MG/3ML SOLN Take 3 mLs by nebulization every 6 (six) hours. 03/04/15   Maryann Mikhail, DO  Multiple Vitamin (MULTIVITAMIN WITH MINERALS) TABS tablet Take 1 tablet by mouth daily. Patient not taking: Reported on 03/13/2015 03/04/15   Velta Addison Mikhail, DO  polyethylene glycol (MIRALAX / GLYCOLAX) packet Take 17 g by mouth daily. 05/04/15   Florencia Reasons, MD  PROAIR HFA 108 (90 BASE) MCG/ACT inhaler INHALE 2 PUFFS EVERY 4 HOURS AS NEEDED FOR WHEEZING OR SHORTNESS OF BREATH 04/18/15   Historical Provider, MD  SPIRIVA HANDIHALER 18 MCG inhalation capsule INHALE CONTENTS OF 1 CAPSULE USING HANDIHALER EVERY DAY FOR BREATHING 04/18/15   Historical Provider, MD  traZODone (DESYREL) 50 MG tablet Take 50 mg by mouth at bedtime.    Historical Provider, MD   SpO2 97% Physical Exam  Constitutional: He is oriented to person, place, and time. He appears well-developed and well-nourished. No distress.  Frail-appearing Caucasian male, cachectic, hard of hearing.  HENT:  Head: Atraumatic.  Eyes: Conjunctivae are normal.  Neck: Neck supple.  Cardiovascular: Normal rate and regular rhythm.   Pulmonary/Chest: Effort normal and breath sounds normal.  Shallow lung sounds, poor effort but no obvious wheezes, rales, rhonchi  Abdominal: Soft. Bowel sounds are normal. He exhibits no distension. There is tenderness (Mild generalized abdominal tenderness without focal point tenderness. Abdomen nondistended.).  Musculoskeletal: He exhibits no edema.  Neurological: He is alert and oriented to person, place, and time.  Skin: No rash noted.  Psychiatric: He has a normal mood and affect.  Nursing note and vitals reviewed.   ED Course  Procedures (including critical care time)  Patient complaining of shortness of breath when he ran out of his nebs machine. He is currently satting at 97% some room air and currently in no acute respiratory discomfort. He was diagnosed with CAP  recently was treated with Levaquin. No fever or hypoxia concerning for pneumonia however I will obtain x-ray.  He also complaining of having abdominal pain but requesting for coffee. Recently diagnosed with suspected small bowel obstruction resolved with nothing by mouth and was discharged from hospital 2 days ago. He is able to pass flatus and had a bowel movement today. Will obtain KUB.  EMS mentioned bedbugs however patient's clothing has been inspected and no evidence of bedbugs on exam and no signs of rash.   4:26 PM KUB today show marked limitations of the small bowel loops which suggestive for a small bowel structure. The area obstruction is unclear although there is a large amount of stools in the left colon. Chest x-ray shows no concerning finding.  I discussed this finding with Dr. Alvino Chapel who recommend PO trial.  If pt unable to tolerates, then will consider admission for further care.    4:49 PM The patient ate a [place] sandwich and able to drink fluid, and tolerates well. He felt comfortable going home. He is aware to  return if his condition worsened. He will need to follow with his PCP for further care.   Labs Review Labs Reviewed - No data to display  Imaging Review Dg Abd Acute W/chest  05/08/2015   CLINICAL DATA:  Chronic shortness of breath. COPD. Constipation. Abdominal pain on and off.  EXAM: DG ABDOMEN ACUTE W/ 1V CHEST  COMPARISON:  05/02/2015 and chest CT 03/25/2015  FINDINGS: Lucency throughout the lungs is compatible with emphysema. Subtle nodular densities in the mid/lower chest bilaterally probably represent nipple shadows based on the prior examinations. No significant airspace disease. Heart size is normal. Mild blunting at the left costophrenic angle is similar to the recent comparison examination. Again noted are dilated loops of bowel. There are dilated small bowel loops measuring up to 4.7 cm. There is a large amount of high density stool in the left colon and  left transverse colon. No evidence for free air on the upright view.  IMPRESSION: Marked dilatation of small bowel loops are suggestive for a bowel obstruction. The area of obstruction is unclear although there is a large amount of stool along the left colon.  Emphysematous changes in the chest. Nodular densities probably represent nipple shadows as described. There were no concerning nodules on the recent chest CT.   Electronically Signed   By: Markus Daft M.D.   On: 05/08/2015 15:20     EKG Interpretation None      MDM   Final diagnoses:  SOB (shortness of breath)  Constipation    BP 115/78 mmHg  Pulse 80  Temp(Src) 98.7 F (37.1 C) (Oral)  Resp 18  SpO2 92%  I have reviewed nursing notes and vital signs. I personally viewed the imaging tests through PACS system and agrees with radiologist's intepretation I reviewed available ER/hospitalization records through the EMR     Domenic Moras, PA-C 05/08/15 [place], MD 05/13/15 [phone]

## 2015-05-08 NOTE — ED Notes (Signed)
Pt tolerating oral fluids 

## 2015-05-08 NOTE — ED Notes (Signed)
Per [facility], pt frequently visits the ER, lives alone, hx of COPD and has SOB for years, pt uses his neb treatments all day and his machiine stopped working so patient called 911. Pt in NAD upon arrival to ER. Pt hx of bedbugs, at this time no bugs are visible. Pt belongings double bagged at this time. Pt is AAOX4, in NAD. Ambulatory in room.

## 2015-05-08 NOTE — ED Notes (Signed)
Upon returning to the room after ambulating, pt yelled at this EMT, "Get me a damn pillow! This is a towel and it's hard as a rock. This EMT asked pt to please get back in bed due to fall risk status, and pt refused. EMT called security, but pt complied before arrival. Pt is currently in bed hooked up to pulse ox and BP and side rails are up.

## 2015-05-24 ENCOUNTER — Encounter (HOSPITAL_COMMUNITY): Payer: Self-pay | Admitting: Emergency Medicine

## 2015-05-24 ENCOUNTER — Emergency Department (HOSPITAL_COMMUNITY)
Admission: EM | Admit: 2015-05-24 | Discharge: 2015-05-25 | Disposition: A | Discharge status: Home / Self Care | Payer: Medicaid Other | Attending: Emergency Medicine | Admitting: Emergency Medicine

## 2015-05-24 ENCOUNTER — Emergency Department (HOSPITAL_COMMUNITY): Payer: Medicaid Other

## 2015-05-24 DIAGNOSIS — R451 Restlessness and agitation: Secondary | ICD-10-CM | POA: Diagnosis not present

## 2015-05-24 DIAGNOSIS — Z79899 Other long term (current) drug therapy: Secondary | ICD-10-CM | POA: Insufficient documentation

## 2015-05-24 DIAGNOSIS — J441 Chronic obstructive pulmonary disease with (acute) exacerbation: Secondary | ICD-10-CM | POA: Insufficient documentation

## 2015-05-24 DIAGNOSIS — Z7951 Long term (current) use of inhaled steroids: Secondary | ICD-10-CM | POA: Diagnosis not present

## 2015-05-24 DIAGNOSIS — E785 Hyperlipidemia, unspecified: Secondary | ICD-10-CM | POA: Insufficient documentation

## 2015-05-24 DIAGNOSIS — Z7982 Long term (current) use of aspirin: Secondary | ICD-10-CM | POA: Insufficient documentation

## 2015-05-24 DIAGNOSIS — Z720 Tobacco use: Secondary | ICD-10-CM | POA: Insufficient documentation

## 2015-05-24 DIAGNOSIS — R079 Chest pain, unspecified: Secondary | ICD-10-CM | POA: Diagnosis not present

## 2015-05-24 DIAGNOSIS — H9190 Unspecified hearing loss, unspecified ear: Secondary | ICD-10-CM | POA: Insufficient documentation

## 2015-05-24 DIAGNOSIS — J449 Chronic obstructive pulmonary disease, unspecified: Secondary | ICD-10-CM

## 2015-05-24 DIAGNOSIS — R0602 Shortness of breath: Secondary | ICD-10-CM | POA: Diagnosis present

## 2015-05-24 DIAGNOSIS — I509 Heart failure, unspecified: Secondary | ICD-10-CM | POA: Insufficient documentation

## 2015-05-24 DIAGNOSIS — Z9889 Other specified postprocedural states: Secondary | ICD-10-CM | POA: Insufficient documentation

## 2015-05-24 LAB — BASIC METABOLIC PANEL
Anion gap: 7 (ref 5–15)
BUN: 17 mg/dL (ref 6–20)
CO2: 30 mmol/L (ref 22–32)
CREATININE: 0.47 mg/dL — AB (ref 0.61–1.24)
Calcium: 9.3 mg/dL (ref 8.9–10.3)
Chloride: 102 mmol/L (ref 101–111)
GFR calc Af Amer: >60 mL/min (ref >60)
Glucose, Bld: 93 mg/dL (ref 65–99)
POTASSIUM: 3.8 mmol/L (ref 3.5–5.1)
SODIUM: 139 mmol/L (ref 135–145)

## 2015-05-24 LAB — CBC WITH DIFFERENTIAL/PLATELET
Basophils Absolute: 0.1 10*3/uL (ref 0.0–0.1)
Basophils Relative: 1 % (ref 0–1)
EOS ABS: 0.3 10*3/uL (ref 0.0–0.7)
EOS PCT: 5 % (ref 0–5)
HCT: 42 % (ref 39.0–52.0)
Hemoglobin: 13.8 g/dL (ref 13.0–17.0)
Lymphocytes Relative: 21 % (ref 12–46)
Lymphs Abs: 1.3 10*3/uL (ref 0.7–4.0)
MCH: 30.5 pg (ref 26.0–34.0)
MCHC: 32.9 g/dL (ref 30.0–36.0)
MCV: 92.7 fL (ref 78.0–100.0)
Monocytes Absolute: 0.6 10*3/uL (ref 0.1–1.0)
Monocytes Relative: 10 % (ref 3–12)
NEUTROS PCT: 63 % (ref 43–77)
Neutro Abs: 3.8 10*3/uL (ref 1.7–7.7)
PLATELETS: 169 10*3/uL (ref 150–400)
RBC: 4.53 MIL/uL (ref 4.22–5.81)
RDW: 13.7 % (ref 11.5–15.5)
WBC: 6 10*3/uL (ref 4.0–10.5)

## 2015-05-24 LAB — TROPONIN I

## 2015-05-24 MED ORDER — PREDNISONE 20 MG PO TABS: 60.0000 mg | ORAL_TABLET | Freq: Every day | ORAL | Status: DC

## 2015-05-24 MED ORDER — OXYCODONE-ACETAMINOPHEN 5-325 MG PO TABS
1.0000 | ORAL_TABLET | Freq: Once | ORAL | Status: AC
  Administered 2015-05-24: 1 via ORAL
  Filled 2015-05-24: qty 1

## 2015-05-24 NOTE — ED Provider Notes (Signed)
CSN: [id number]     Arrival date & time 05/24/15  1628 History   First MD Initiated Contact with Patient 05/24/15 1631     Chief Complaint  Patient presents with  . Shortness of Breath  . COPD  . Agitation     (Consider location/radiation/quality/duration/timing/severity/associated sxs/prior Treatment) HPI Comments: Patient presents to the emergency department for evaluation of shortness of breath. Patient is brought to the emergency department by ambulance. Patient reports that he had progressively worsening difficulty breathing with pain in his chest over the course of today. He was given nebulizer treatment by EMS and feels much improved. Patient reports cough is productive of thick yellow sputum. No fever.   Past Medical History  Diagnosis Date  . COPD (chronic obstructive pulmonary disease)   . HOH (hard of hearing)   . CHF (congestive heart failure) EF 20 - 25% in 2015  . HLD (hyperlipidemia)    Past Surgical History  Procedure Laterality Date  . Appendectomy    . Left heart catheterization with coronary angiogram N/A 11/07/2013    Procedure: LEFT HEART CATHETERIZATION WITH CORONARY ANGIOGRAM;  Surgeon: Clent Demark, MD;  Location: [facility] CATH LAB;  Service: Cardiovascular;  Laterality: N/A;  . Cardiac stents      No stent history   Family History  Problem Relation Age of Onset  . Stroke Mother    Social History  Substance Use Topics  . Smoking status: Current Every Day Smoker    Types: Cigarettes  . Smokeless tobacco: Never Used  . Alcohol Use: No    Review of Systems  Respiratory: Positive for shortness of breath.   Cardiovascular: Positive for chest pain.  All other systems reviewed and are negative.     Allergies  Review of patient's allergies indicates no known allergies.  Home Medications   Prior to Admission medications   Medication Sig Start Date End Date Taking? Authorizing Provider  albuterol (PROVENTIL) (2.5 MG/3ML) 0.083% nebulizer solution  Take 3 mLs (2.5 mg total) by nebulization every 2 (two) hours as needed for wheezing or shortness of breath. 03/04/15  Yes Maryann Mikhail, DO  aspirin EC 81 MG EC tablet Take 1 tablet (81 mg total) by mouth daily. 03/04/15  Yes Maryann Mikhail, DO  atorvastatin (LIPITOR) 40 MG tablet Take 1 tablet (40 mg total) by mouth at bedtime. 03/04/15  Yes Maryann Mikhail, DO  budesonide-formoterol (SYMBICORT) 160-4.5 MCG/ACT inhaler Inhale 2 puffs into the lungs 2 (two) times daily.   Yes Historical Provider, MD  [employer] SLEEP AID 5 MG TABS TAKE10 MG BY MOUTH 30 MIN BEFORE BED TIME. 04/18/15  Yes Historical Provider, MD  ipratropium-albuterol (DUONEB) 0.5-2.5 (3) MG/3ML SOLN Take 3 mLs by nebulization every 6 (six) hours. 05/08/15  Yes Domenic Moras, PA-C  Multiple Vitamin (MULTIVITAMIN WITH MINERALS) TABS tablet Take 1 tablet by mouth daily. 03/04/15  Yes Maryann Mikhail, DO  polyethylene glycol (MIRALAX / GLYCOLAX) packet Take 17 g by mouth daily. 05/08/15  Yes Domenic Moras, PA-C  PROAIR HFA 108 (90 BASE) MCG/ACT inhaler INHALE 2 PUFFS EVERY 4 HOURS AS NEEDED FOR WHEEZING OR SHORTNESS OF BREATH 04/18/15  Yes Historical Provider, MD  SPIRIVA HANDIHALER 18 MCG inhalation capsule INHALE CONTENTS OF 1 CAPSULE USING HANDIHALER EVERY DAY FOR BREATHING 04/18/15  Yes Historical Provider, MD  traZODone (DESYREL) 50 MG tablet Take 50 mg by mouth at bedtime.   Yes Historical Provider, MD  feeding supplement, ENSURE ENLIVE, (ENSURE ENLIVE) LIQD Take 237 mLs by mouth 2 (two) times  daily between meals. Patient not taking: Reported on 03/13/2015 03/04/15   Velta Addison Mikhail, DO  guaiFENesin (MUCINEX) 600 MG 12 hr tablet Take 2 tablets (1,200 mg total) by mouth 2 (two) times daily. Patient taking differently: Take 1,200 mg by mouth 2 (two) times daily as needed for cough.  03/04/15   Maryann Mikhail, DO   BP 131/97 mmHg  Pulse 97  Temp(Src) 98.2 F (36.8 C) (Oral)  Resp 19  SpO2 95% Physical Exam  Constitutional: He is oriented to person,  place, and time. He appears well-developed and well-nourished. No distress.  HENT:  Head: Normocephalic and atraumatic.  Right Ear: Hearing normal.  Left Ear: Hearing normal.  Nose: Nose normal.  Mouth/Throat: Oropharynx is clear and moist and mucous membranes are normal.  Eyes: Conjunctivae and EOM are normal. Pupils are equal, round, and reactive to light.  Neck: Normal range of motion. Neck supple.  Cardiovascular: Regular rhythm, S1 normal and S2 normal.  Exam reveals no gallop and no friction rub.   No murmur heard. Pulmonary/Chest: Effort normal. No respiratory distress. He has decreased breath sounds. He has wheezes. He exhibits no tenderness.  Abdominal: Soft. Normal appearance and bowel sounds are normal. There is no hepatosplenomegaly. There is no tenderness. There is no rebound, no guarding, no tenderness at McBurney's point and negative Murphy's sign. No hernia.  Musculoskeletal: Normal range of motion.  Neurological: He is alert and oriented to person, place, and time. He has normal strength. No cranial nerve deficit or sensory deficit. Coordination normal. GCS eye subscore is 4. GCS verbal subscore is 5. GCS motor subscore is 6.  Skin: Skin is warm, dry and intact. No rash noted. No cyanosis.  Psychiatric: He has a normal mood and affect. His speech is normal and behavior is normal. Thought content normal.  Nursing note and vitals reviewed.   ED Course  Procedures (including critical care time) Labs Review Labs Reviewed  BASIC METABOLIC PANEL - Abnormal; Notable for the following:    Creatinine, Ser 0.47 (*)    All other components within normal limits  CBC WITH DIFFERENTIAL/PLATELET  TROPONIN I    Imaging Review Dg Chest 2 View  05/24/2015   CLINICAL DATA:  Shortness of breath  EXAM: CHEST  2 VIEW  COMPARISON:  05/08/2015  FINDINGS: Normal heart size, mediastinal contours, and pulmonary vascularity.  Emphysematous changes consistent with COPD.  No acute infiltrate,  pleural effusion or pneumothorax.  Probable nipple shadows unchanged.  Bones demineralized.  Numerous EKG leads project over chest.  IMPRESSION: COPD changes.  No acute abnormalities.   Electronically Signed   By: Lavonia Dana M.D.   On: 05/24/2015 17:30   I have personally reviewed and evaluated these images and lab results as part of my medical decision-making.   EKG Interpretation   Date/Time:  Friday May 24 2015 18:57:07 EDT Ventricular Rate:  95 PR Interval:  149 QRS Duration: 84 QT Interval:  345 QTC Calculation: 434 R Axis:   75 Text Interpretation:  Sinus rhythm Anteroseptal infarct, age indeterminate  No significant change since last tracing Confirmed by Julion Gatt  MD,  Diarra Kos [phone]) on 05/24/2015 7:05:48 PM      MDM   Final diagnoses:  None  copd   Patient presents to the emergency room for evaluation of shortness of breath. Patient does have a history of COPD. He was administered albuterol during transportation and has improved. Patient does not have any hypoxia at arrival. Vital signs are stable. Patient reports pain associated  with his breathing. Patient is sharp in nature, atypical for cardiac pain. EKG does not show any evidence of ischemia or infarct. Unchanged from previous. Troponin is negative. Chest x-ray unremarkable.    Orpah Greek, MD 05/24/15 [phone]

## 2015-05-24 NOTE — Discharge Instructions (Signed)

## 2015-05-24 NOTE — ED Notes (Signed)
Pt. Refused to leave , requested to see the Charge Nurse ,asking for place to stay overnight . Charge Nurse notified.

## 2015-05-24 NOTE — ED Notes (Signed)
Social work called to speak with patient.

## 2015-05-24 NOTE — ED Notes (Signed)
Per EMS called out for shortness of breath. Pt having COPD exacerbation starting yesterday and worsening into today. On EMS arrival they state he was having inspiratory/expiratory wheezing, they gave 5 mg Albuterol Neb in route. Pt presents not wheezing, rhonchi in left lobe, in no obvious respiratory distress.

## 2015-05-24 NOTE — ED Notes (Signed)
Bed: SU86 Expected date:  Expected time:  Means of arrival:  Comments: EMS-COPD

## 2015-05-24 NOTE — ED Notes (Signed)
In triage pt had an unprovoked outburst where he picked up his large back pack and threw it across the room at no one yelling "There ya go mothafuckas!" When asked why he did that he said "What? It was heavy and I didn't need it sitting on the bed. Ain't nothin in there that's gonna break."

## 2015-06-16 DIAGNOSIS — I10 Essential (primary) hypertension: Secondary | ICD-10-CM | POA: Diagnosis not present

## 2015-06-16 DIAGNOSIS — J441 Chronic obstructive pulmonary disease with (acute) exacerbation: Secondary | ICD-10-CM | POA: Diagnosis not present

## 2015-06-16 DIAGNOSIS — H9190 Unspecified hearing loss, unspecified ear: Secondary | ICD-10-CM | POA: Diagnosis not present

## 2015-06-16 DIAGNOSIS — R05 Cough: Secondary | ICD-10-CM | POA: Diagnosis not present

## 2015-06-16 DIAGNOSIS — Z87891 Personal history of nicotine dependence: Secondary | ICD-10-CM | POA: Diagnosis not present

## 2015-06-16 DIAGNOSIS — J449 Chronic obstructive pulmonary disease, unspecified: Secondary | ICD-10-CM | POA: Diagnosis not present

## 2015-06-16 DIAGNOSIS — R509 Fever, unspecified: Secondary | ICD-10-CM | POA: Diagnosis not present

## 2015-06-16 DIAGNOSIS — R0602 Shortness of breath: Secondary | ICD-10-CM | POA: Diagnosis not present

## 2015-07-04 DIAGNOSIS — R079 Chest pain, unspecified: Secondary | ICD-10-CM | POA: Diagnosis not present

## 2015-07-04 DIAGNOSIS — R1110 Vomiting, unspecified: Secondary | ICD-10-CM | POA: Diagnosis not present

## 2015-07-04 DIAGNOSIS — J449 Chronic obstructive pulmonary disease, unspecified: Secondary | ICD-10-CM | POA: Diagnosis not present

## 2015-07-04 DIAGNOSIS — H9193 Unspecified hearing loss, bilateral: Secondary | ICD-10-CM | POA: Diagnosis not present

## 2015-07-04 DIAGNOSIS — I10 Essential (primary) hypertension: Secondary | ICD-10-CM | POA: Diagnosis not present

## 2015-07-04 DIAGNOSIS — R072 Precordial pain: Secondary | ICD-10-CM | POA: Diagnosis not present

## 2015-07-04 DIAGNOSIS — R0789 Other chest pain: Secondary | ICD-10-CM | POA: Diagnosis not present

## 2015-07-04 DIAGNOSIS — J441 Chronic obstructive pulmonary disease with (acute) exacerbation: Secondary | ICD-10-CM | POA: Diagnosis not present

## 2015-07-04 DIAGNOSIS — Z87891 Personal history of nicotine dependence: Secondary | ICD-10-CM | POA: Diagnosis not present

## 2015-07-04 DIAGNOSIS — Z23 Encounter for immunization: Secondary | ICD-10-CM | POA: Diagnosis not present

## 2015-07-05 DIAGNOSIS — R079 Chest pain, unspecified: Secondary | ICD-10-CM | POA: Diagnosis not present

## 2015-07-05 DIAGNOSIS — I10 Essential (primary) hypertension: Secondary | ICD-10-CM | POA: Diagnosis not present

## 2015-07-05 DIAGNOSIS — J441 Chronic obstructive pulmonary disease with (acute) exacerbation: Secondary | ICD-10-CM | POA: Diagnosis not present

## 2015-07-05 DIAGNOSIS — H9193 Unspecified hearing loss, bilateral: Secondary | ICD-10-CM | POA: Diagnosis not present

## 2015-07-05 DIAGNOSIS — Z23 Encounter for immunization: Secondary | ICD-10-CM | POA: Diagnosis not present

## 2015-07-05 DIAGNOSIS — R072 Precordial pain: Secondary | ICD-10-CM | POA: Diagnosis not present

## 2015-07-05 DIAGNOSIS — Z87891 Personal history of nicotine dependence: Secondary | ICD-10-CM | POA: Diagnosis not present

## 2015-07-22 DIAGNOSIS — I10 Essential (primary) hypertension: Secondary | ICD-10-CM | POA: Diagnosis not present

## 2015-07-22 DIAGNOSIS — J441 Chronic obstructive pulmonary disease with (acute) exacerbation: Secondary | ICD-10-CM | POA: Diagnosis not present

## 2015-08-11 ENCOUNTER — Emergency Department (HOSPITAL_COMMUNITY): Payer: Medicare Other

## 2015-08-11 ENCOUNTER — Encounter (HOSPITAL_COMMUNITY): Payer: Self-pay | Admitting: *Deleted

## 2015-08-11 ENCOUNTER — Inpatient Hospital Stay (HOSPITAL_COMMUNITY)
Admission: EM | Admit: 2015-08-11 | Discharge: 2015-08-13 | DRG: 190 | Disposition: A | Discharge status: Home Health | Payer: Medicare Other | Attending: Internal Medicine | Admitting: Internal Medicine

## 2015-08-11 DIAGNOSIS — E871 Hypo-osmolality and hyponatremia: Secondary | ICD-10-CM | POA: Diagnosis not present

## 2015-08-11 DIAGNOSIS — Z681 Body mass index (BMI) 19 or less, adult: Secondary | ICD-10-CM

## 2015-08-11 DIAGNOSIS — E785 Hyperlipidemia, unspecified: Secondary | ICD-10-CM | POA: Diagnosis present

## 2015-08-11 DIAGNOSIS — F17210 Nicotine dependence, cigarettes, uncomplicated: Secondary | ICD-10-CM | POA: Diagnosis not present

## 2015-08-11 DIAGNOSIS — E440 Moderate protein-calorie malnutrition: Secondary | ICD-10-CM | POA: Diagnosis present

## 2015-08-11 DIAGNOSIS — H9190 Unspecified hearing loss, unspecified ear: Secondary | ICD-10-CM | POA: Diagnosis not present

## 2015-08-11 DIAGNOSIS — J441 Chronic obstructive pulmonary disease with (acute) exacerbation: Principal | ICD-10-CM | POA: Diagnosis present

## 2015-08-11 DIAGNOSIS — I110 Hypertensive heart disease with heart failure: Secondary | ICD-10-CM | POA: Diagnosis present

## 2015-08-11 DIAGNOSIS — E278 Other specified disorders of adrenal gland: Secondary | ICD-10-CM | POA: Diagnosis present

## 2015-08-11 DIAGNOSIS — I5022 Chronic systolic (congestive) heart failure: Secondary | ICD-10-CM | POA: Diagnosis present

## 2015-08-11 DIAGNOSIS — E0590 Thyrotoxicosis, unspecified without thyrotoxic crisis or storm: Secondary | ICD-10-CM | POA: Diagnosis present

## 2015-08-11 DIAGNOSIS — I2510 Atherosclerotic heart disease of native coronary artery without angina pectoris: Secondary | ICD-10-CM | POA: Diagnosis present

## 2015-08-11 DIAGNOSIS — J9601 Acute respiratory failure with hypoxia: Secondary | ICD-10-CM | POA: Diagnosis not present

## 2015-08-11 DIAGNOSIS — Z720 Tobacco use: Secondary | ICD-10-CM | POA: Diagnosis present

## 2015-08-11 DIAGNOSIS — D638 Anemia in other chronic diseases classified elsewhere: Secondary | ICD-10-CM | POA: Diagnosis present

## 2015-08-11 DIAGNOSIS — R0682 Tachypnea, not elsewhere classified: Secondary | ICD-10-CM | POA: Diagnosis not present

## 2015-08-11 DIAGNOSIS — I10 Essential (primary) hypertension: Secondary | ICD-10-CM | POA: Diagnosis present

## 2015-08-11 DIAGNOSIS — R079 Chest pain, unspecified: Secondary | ICD-10-CM

## 2015-08-11 DIAGNOSIS — Z7982 Long term (current) use of aspirin: Secondary | ICD-10-CM

## 2015-08-11 DIAGNOSIS — Z79899 Other long term (current) drug therapy: Secondary | ICD-10-CM

## 2015-08-11 DIAGNOSIS — J9600 Acute respiratory failure, unspecified whether with hypoxia or hypercapnia: Secondary | ICD-10-CM | POA: Diagnosis present

## 2015-08-11 DIAGNOSIS — E860 Dehydration: Secondary | ICD-10-CM | POA: Diagnosis present

## 2015-08-11 DIAGNOSIS — E279 Disorder of adrenal gland, unspecified: Secondary | ICD-10-CM

## 2015-08-11 DIAGNOSIS — R0789 Other chest pain: Secondary | ICD-10-CM | POA: Diagnosis not present

## 2015-08-11 DIAGNOSIS — R0602 Shortness of breath: Secondary | ICD-10-CM | POA: Diagnosis not present

## 2015-08-11 LAB — BASIC METABOLIC PANEL
ANION GAP: 9 (ref 5–15)
BUN: 13 mg/dL (ref 6–20)
CHLORIDE: 97 mmol/L — AB (ref 101–111)
CO2: 25 mmol/L (ref 22–32)
Calcium: 8.6 mg/dL — ABNORMAL LOW (ref 8.9–10.3)
Creatinine, Ser: 0.67 mg/dL (ref 0.61–1.24)
GFR calc Af Amer: >60 mL/min (ref >60)
GFR calc non Af Amer: >60 mL/min (ref >60)
GLUCOSE: 129 mg/dL — AB (ref 65–99)
POTASSIUM: 3.8 mmol/L (ref 3.5–5.1)
Sodium: 131 mmol/L — ABNORMAL LOW (ref 135–145)

## 2015-08-11 LAB — CBC WITH DIFFERENTIAL/PLATELET
BASOS ABS: 0 10*3/uL (ref 0.0–0.1)
Basophils Relative: 0 %
Eosinophils Absolute: 0.2 10*3/uL (ref 0.0–0.7)
Eosinophils Relative: 3 %
HEMATOCRIT: 38.3 % — AB (ref 39.0–52.0)
Hemoglobin: 12.5 g/dL — ABNORMAL LOW (ref 13.0–17.0)
LYMPHS PCT: 18 %
Lymphs Abs: 1.2 10*3/uL (ref 0.7–4.0)
MCH: 29.5 pg (ref 26.0–34.0)
MCHC: 32.6 g/dL (ref 30.0–36.0)
MCV: 90.3 fL (ref 78.0–100.0)
Monocytes Absolute: 0.5 10*3/uL (ref 0.1–1.0)
Monocytes Relative: 7 %
NEUTROS ABS: 4.6 10*3/uL (ref 1.7–7.7)
NEUTROS PCT: 72 %
Platelets: 152 10*3/uL (ref 150–400)
RBC: 4.24 MIL/uL (ref 4.22–5.81)
RDW: 14 % (ref 11.5–15.5)
WBC: 6.4 10*3/uL (ref 4.0–10.5)

## 2015-08-11 LAB — TROPONIN I: Troponin I: <0.03 ng/mL (ref <0.031)

## 2015-08-11 LAB — I-STAT TROPONIN, ED: Troponin i, poc: 0 ng/mL (ref 0.00–0.08)

## 2015-08-11 LAB — TSH: TSH: 0.027 u[IU]/mL — ABNORMAL LOW (ref 0.350–4.500)

## 2015-08-11 LAB — BRAIN NATRIURETIC PEPTIDE: B Natriuretic Peptide: 37.2 pg/mL (ref 0.0–100.0)

## 2015-08-11 MED ORDER — ACETAMINOPHEN 650 MG RE SUPP: 650.0000 mg | Freq: Four times a day (QID) | RECTAL | Status: DC | PRN

## 2015-08-11 MED ORDER — ADULT MULTIVITAMIN W/MINERALS CH
1.0000 | ORAL_TABLET | Freq: Every day | ORAL | Status: DC
  Administered 2015-08-11 – 2015-08-13 (×3): 1 via ORAL
  Filled 2015-08-11 (×3): qty 1

## 2015-08-11 MED ORDER — METHYLPREDNISOLONE SODIUM SUCC 125 MG IJ SOLR
125.0000 mg | Freq: Once | INTRAMUSCULAR | Status: AC
  Administered 2015-08-11: 125 mg via INTRAVENOUS
  Filled 2015-08-11: qty 2

## 2015-08-11 MED ORDER — TRAZODONE HCL 50 MG PO TABS
50.0000 mg | ORAL_TABLET | Freq: Every day | ORAL | Status: DC
  Administered 2015-08-11 – 2015-08-12 (×2): 50 mg via ORAL
  Filled 2015-08-11 (×2): qty 1

## 2015-08-11 MED ORDER — ALBUTEROL (5 MG/ML) CONTINUOUS INHALATION SOLN
10.0000 mg/h | INHALATION_SOLUTION | RESPIRATORY_TRACT | Status: DC
  Administered 2015-08-11: 10 mg/h via RESPIRATORY_TRACT

## 2015-08-11 MED ORDER — IPRATROPIUM-ALBUTEROL 0.5-2.5 (3) MG/3ML IN SOLN
3.0000 mL | RESPIRATORY_TRACT | Status: DC
  Administered 2015-08-11: 3 mL via RESPIRATORY_TRACT
  Filled 2015-08-11: qty 3

## 2015-08-11 MED ORDER — IPRATROPIUM-ALBUTEROL 0.5-2.5 (3) MG/3ML IN SOLN
3.0000 mL | Freq: Four times a day (QID) | RESPIRATORY_TRACT | Status: DC
  Administered 2015-08-12 – 2015-08-13 (×5): 3 mL via RESPIRATORY_TRACT
  Filled 2015-08-11 (×6): qty 3

## 2015-08-11 MED ORDER — TIOTROPIUM BROMIDE MONOHYDRATE 18 MCG IN CAPS
18.0000 ug | ORAL_CAPSULE | Freq: Every day | RESPIRATORY_TRACT | Status: DC
  Filled 2015-08-11: qty 5

## 2015-08-11 MED ORDER — SODIUM CHLORIDE 0.9 % IJ SOLN
3.0000 mL | Freq: Two times a day (BID) | INTRAMUSCULAR | Status: DC
  Administered 2015-08-11 – 2015-08-13 (×4): 3 mL via INTRAVENOUS

## 2015-08-11 MED ORDER — ATORVASTATIN CALCIUM 40 MG PO TABS
40.0000 mg | ORAL_TABLET | Freq: Every day | ORAL | Status: DC
  Administered 2015-08-11 – 2015-08-12 (×2): 40 mg via ORAL
  Filled 2015-08-11 (×2): qty 1

## 2015-08-11 MED ORDER — METHYLPREDNISOLONE SODIUM SUCC 125 MG IJ SOLR
60.0000 mg | Freq: Four times a day (QID) | INTRAMUSCULAR | Status: DC
  Administered 2015-08-11 – 2015-08-12 (×3): 60 mg via INTRAVENOUS
  Filled 2015-08-11 (×3): qty 2

## 2015-08-11 MED ORDER — POLYETHYLENE GLYCOL 3350 17 G PO PACK: 17.0000 g | PACK | Freq: Every day | ORAL | Status: DC | PRN

## 2015-08-11 MED ORDER — ALBUTEROL SULFATE (2.5 MG/3ML) 0.083% IN NEBU: 3.0000 mL | INHALATION_SOLUTION | RESPIRATORY_TRACT | Status: DC | PRN

## 2015-08-11 MED ORDER — ASPIRIN EC 81 MG PO TBEC
81.0000 mg | DELAYED_RELEASE_TABLET | Freq: Every day | ORAL | Status: DC
  Administered 2015-08-12 – 2015-08-13 (×2): 81 mg via ORAL
  Filled 2015-08-11 (×2): qty 1

## 2015-08-11 MED ORDER — IPRATROPIUM-ALBUTEROL 0.5-2.5 (3) MG/3ML IN SOLN
3.0000 mL | Freq: Once | RESPIRATORY_TRACT | Status: AC
  Administered 2015-08-11: 3 mL via RESPIRATORY_TRACT
  Filled 2015-08-11: qty 3

## 2015-08-11 MED ORDER — AZITHROMYCIN 250 MG PO TABS
500.0000 mg | ORAL_TABLET | Freq: Every day | ORAL | Status: AC
  Administered 2015-08-11: 500 mg via ORAL
  Filled 2015-08-11: qty 2

## 2015-08-11 MED ORDER — ASPIRIN 81 MG PO CHEW
324.0000 mg | CHEWABLE_TABLET | Freq: Once | ORAL | Status: AC
  Administered 2015-08-11: 324 mg via ORAL
  Filled 2015-08-11: qty 4

## 2015-08-11 MED ORDER — ACETAMINOPHEN 325 MG PO TABS: 650.0000 mg | ORAL_TABLET | Freq: Four times a day (QID) | ORAL | Status: DC | PRN

## 2015-08-11 MED ORDER — AZITHROMYCIN 250 MG PO TABS
250.0000 mg | ORAL_TABLET | Freq: Every day | ORAL | Status: DC
  Administered 2015-08-12 – 2015-08-13 (×2): 250 mg via ORAL
  Filled 2015-08-11 (×2): qty 1

## 2015-08-11 MED ORDER — ALBUTEROL (5 MG/ML) CONTINUOUS INHALATION SOLN
10.0000 mg/h | INHALATION_SOLUTION | RESPIRATORY_TRACT | Status: DC
  Administered 2015-08-11: 10 mg/h via RESPIRATORY_TRACT
  Filled 2015-08-11: qty 20

## 2015-08-11 MED ORDER — ENOXAPARIN SODIUM 40 MG/0.4ML ~~LOC~~ SOLN
40.0000 mg | SUBCUTANEOUS | Status: DC
  Administered 2015-08-11 – 2015-08-12 (×2): 40 mg via SUBCUTANEOUS
  Filled 2015-08-11 (×2): qty 0.4

## 2015-08-11 MED ORDER — IPRATROPIUM-ALBUTEROL 0.5-2.5 (3) MG/3ML IN SOLN: 3.0000 mL | RESPIRATORY_TRACT | Status: DC | PRN

## 2015-08-11 NOTE — ED Notes (Signed)
MD at bedside. 

## 2015-08-11 NOTE — H&P (Signed)
[facility] History and Physical                                                                                    Anthony Bolton, is a 64 y.o. male  MRN: [id number]   DOB - 1951-07-03  Admit Date - 08/11/2015  Outpatient Primary MD for the patient is [name], MD  Referring MD: Rex Kras / ER  With History of -  Past Medical History  Diagnosis Date  . COPD (chronic obstructive pulmonary disease) ([place])   . HOH (hard of hearing)   . CHF (congestive heart failure) (HCC) EF 20 - 25% in 2015  . HLD (hyperlipidemia)       Past Surgical History  Procedure Laterality Date  . Appendectomy    . Left heart catheterization with coronary angiogram N/A 11/07/2013    Procedure: LEFT HEART CATHETERIZATION WITH CORONARY ANGIOGRAM;  Surgeon: Clent Demark, MD;  Location: [facility] CATH LAB;  Service: Cardiovascular;  Laterality: N/A;  . Cardiac stents      No stent history    in for   Chief Complaint  Patient presents with  . Chest Pain  . Shortness of Breath     HPI This is a 64 year old male patient with a history of ongoing tobacco abuse with associated COPD, nonobstructed CAD per cardiac catheterization in 123456, chronic systolic congestive heart failure with an EF of 20-25%, dyslipidemia, apparent hyperthyroidism currently not on Tapazole, benign mass of the adrenal gland, and hard of hearing. Patient was recently hospitalized in August 2016 for problems related to partial small bowel obstruction that resolved with conservative management. Of note based on therapy evaluations it was recommended patient be discharged to skilled nursing facility but he refused. Patient presents to the ER today with reports of chest tightness and shortness of breath. He reported to the EDP recurrent issues with shortness of breath for the past 2 months. In further clarification when I examined the patient he's been treated on multiple occasions over the past 2 months for bronchitis and COPD exacerbations  either by his primary care physician or at local hospitals. He is an inconsistent historian. He apparently completed at course of tapering steroids and antibiotics about one month ago. In regards to today's symptoms he awakened around 4:00 this morning with sharp left-sided chest pain and was unable to sleep because of this. He used water only and his nebulizer because he had run out of his medications which did not improve his symptoms. In review of his medication reconciliation sheet it appears the patient is out of the majority of his medications. He reports that for several months he's had a cough with opaque to brownish secretions. He's had chronic atypical chest pain for years. He's had issues with malnutrition and weight loss other he thinks recently he has gained weight. No fevers or chills. No definitive dyspnea on exertion. No orthopnea.  In the ER the patient was mildly hypertensive with a blood pressure of 154/126 which improved on second reading to 147/98. Pulses are regular 90 was afebrile, room air saturations were 93%. Unfortunately he had increased work of breathing and had very limited air movement and  it has been given a total of 2 continuous nebulizers as well as IV Solu-Medrol without improvement in his symptoms. Because of this chest pain he was given one chewable aspirin. Upon and was negative as was EKG. EDP spoke with patient's primary cardiologist Dr. Terrence Dupont who reviewed the chart and recommended that regarding any chest pain or cardiac symptoms that he was stable enough to discharge home and follow-up with him in the office. Because of continued issues with poor airway movement patient will be admitted for COPD exacerbation.   Review of Systems   In addition to the HPI above,  No Fever-chills, myalgias or other constitutional symptoms No Headache, changes with Vision or hearing, new weakness, tingling, numbness in any extremity, No problems swallowing food or Liquids,  indigestion/reflux No palpitations, orthopnea  No Abdominal pain, N/V; no melena or hematochezia, no dark tarry stools, Bowel movements are regular, No dysuria, hematuria or flank pain No new skin rashes, lesions, masses or bruises, No new joints pains-aches No recent weight gain or loss No polyuria, polydypsia or polyphagia,  *A full 10 point Review of Systems was done, except as stated above, all other Review of Systems were negative.  Social History Social History  Substance Use Topics  . Smoking status: Current Every Day Smoker    Types: Cigarettes: 1-2 per day  . Smokeless tobacco: Never Used  . Alcohol Use: No    Resides at: Private residence  Lives with: Alone  Ambulatory status: In the past has walked with a cane but states he lost his cane-denies recent falls   Family History Family History  Problem Relation Age of Onset  . Stroke Mother      Prior to Admission medications   Medication Sig Start Date End Date Taking? Authorizing Provider  albuterol (PROVENTIL) (2.5 MG/3ML) 0.083% nebulizer solution Take 3 mLs (2.5 mg total) by nebulization every 2 (two) hours as needed for wheezing or shortness of breath. 03/04/15  Yes Maryann Mikhail, DO  atorvastatin (LIPITOR) 40 MG tablet Take 1 tablet (40 mg total) by mouth at bedtime. 03/04/15  Yes Maryann Mikhail, DO  budesonide-formoterol (SYMBICORT) 160-4.5 MCG/ACT inhaler Inhale 2 puffs into the lungs 2 (two) times daily.   Yes Historical Provider, MD  [employer] SLEEP AID 5 MG TABS TAKE10 MG BY MOUTH 30 MIN BEFORE BED TIME. 04/18/15  Yes Historical Provider, MD  ibuprofen (ADVIL,MOTRIN) 200 MG tablet Take 200 mg by mouth every 6 (six) hours as needed for mild pain.   Yes Historical Provider, MD  ipratropium-albuterol (DUONEB) 0.5-2.5 (3) MG/3ML SOLN Take 3 mLs by nebulization every 6 (six) hours. 05/08/15  Yes Domenic Moras, PA-C  Multiple Vitamin (MULTIVITAMIN WITH MINERALS) TABS tablet Take 1 tablet by mouth daily. 03/04/15  Yes Maryann  Mikhail, DO  PROAIR HFA 108 (90 BASE) MCG/ACT inhaler INHALE 2 PUFFS EVERY 4 HOURS AS NEEDED FOR WHEEZING OR SHORTNESS OF BREATH 04/18/15  Yes Historical Provider, MD  SPIRIVA HANDIHALER 18 MCG inhalation capsule INHALE CONTENTS OF 1 CAPSULE USING HANDIHALER EVERY DAY FOR BREATHING 04/18/15  Yes Historical Provider, MD  traZODone (DESYREL) 50 MG tablet Take 50 mg by mouth at bedtime.   Yes Historical Provider, MD  aspirin EC 81 MG EC tablet Take 1 tablet (81 mg total) by mouth daily. 03/04/15   Maryann Mikhail, DO    No Known Allergies  Physical Exam  Vitals  Blood pressure 147/98, pulse 87, temperature 97.9 F (36.6 C), temperature source Oral, resp. rate 20, height 5\' 9"  (1.753 m),  weight 111 lb 11.2 oz (50.667 kg), SpO2 100 %.   General:  In mild acute respiratory distress as evidenced by poor airway movement, appears hectic and malnourished  Psych:  Normal affect, Denies Suicidal or Homicidal ideations, Awake Alert, Oriented X 3. Has difficulty recalling medical history-likely compounded by the fact patient is very hard of hearing  Neuro:   No focal neurological deficits, CN II through XII intact except noted to be significantly hard of hearing, Strength 5/5 all 4 extremities, Sensation intact all 4 extremities.  ENT:  Ears and Eyes appear Normal, Conjunctivae clear, PER. Moist oral mucosa without erythema or exudates.  Neck:  Supple, No lymphadenopathy appreciated  Respiratory:  Symmetrical chest wall movement with barrel chest, poor and diminished air movement bilaterally, with diffuse scattered expiratory wheeze. Room Air  Cardiac:  RRR, No Murmurs, no LE edema noted, no JVD, No carotid bruits, peripheral pulses palpable at 2+  Abdomen:  Positive bowel sounds, Soft, Non tender, Non distended,  No masses appreciated, no obvious hepatosplenomegaly  Skin:  No Cyanosis, Normal Skin Turgor, No Skin Rash or Bruise.  Extremities: Symmetrical without obvious trauma or injury,  no  effusions.  Data Review  CBC  Recent Labs Lab 08/11/15 1242  WBC 6.4  HGB 12.5*  HCT 38.3*  PLT 152  MCV 90.3  MCH 29.5  MCHC 32.6  RDW 14.0  LYMPHSABS 1.2  MONOABS 0.5  EOSABS 0.2  BASOSABS 0.0    Chemistries   Recent Labs Lab 08/11/15 1242  NA 131*  K 3.8  CL 97*  CO2 25  GLUCOSE 129*  BUN 13  CREATININE 0.67  CALCIUM 8.6*    estimated creatinine clearance is 66 mL/min (by C-G formula based on Cr of 0.67).  No results for input(s): TSH, T4TOTAL, T3FREE, THYROIDAB in the last 72 hours.  Invalid input(s): FREET3  Coagulation profile No results for input(s): INR, PROTIME in the last 168 hours.  No results for input(s): DDIMER in the last 72 hours.  Cardiac Enzymes No results for input(s): CKMB, TROPONINI, MYOGLOBIN in the last 168 hours.  Invalid input(s): CK  Invalid input(s): POCBNP  Urinalysis    Component Value Date/Time   COLORURINE YELLOW 05/02/2015 [place] 05/02/2015 1439   LABSPEC 1.014 05/02/2015 1439   PHURINE 6.0 05/02/2015 1439   GLUCOSEU NEGATIVE 05/02/2015 1439   HGBUR NEGATIVE 05/02/2015 1439   BILIRUBINUR NEGATIVE 05/02/2015 1439   KETONESUR NEGATIVE 05/02/2015 1439   PROTEINUR NEGATIVE 05/02/2015 1439   UROBILINOGEN 0.2 05/02/2015 1439   NITRITE NEGATIVE 05/02/2015 1439   LEUKOCYTESUR NEGATIVE 05/02/2015 1439    Imaging results:   Dg Chest 2 View  08/11/2015  CLINICAL DATA:  Left-sided chest pain and shortness of breath beginning last night. Initial encounter. EXAM: CHEST  2 VIEW COMPARISON:  PA and lateral chest 05/24/2015 and 03/25/2015. CT chest 03/25/2015. FINDINGS: The lungs are emphysematous but clear. Nipple shadows are again seen. Heart size is normal. No pneumothorax or pleural effusion. No focal bony abnormality. IMPRESSION: Emphysema without acute disease. Electronically Signed   By: Inge Rise M.D.   On: 08/11/2015 13:12     EKG: (Independently reviewed) sinus rhythm with ventricular  rate 89 bpm, QTC 448 ms, voltage criteria for LVH, no ST segment or T-wave changes concerning for ischemia, possible early incomplete right bundle branch block   Assessment & Plan  Principal Problem:   Acute respiratory failure /COPD exacerbation (HCC) -Telemetry bed -Not hypoxic at rest-check ambulatory saturations -Supportive care  with nebulizers and MDI -Suspect patient has not had access to appropriate medications and this contributed to exacerbation symptomatology-patient management consulted -IV Solu-Medrol 60 mg every 6 hours initially with likely transition to prednisone taper soon -Azithromycin by mouth  Active Problems:   Hypertension -Borderline and not on medications at home -Continue to monitor -Follow up on echo    Tobacco abuse -Counseled regarding cessation -Nicotine patch    CAD (nonobstructive per cath 2015) -Atypical chest pain at presentation seems more consistent with pleuritic etiology -EDP discussed case with patient's primary cardiologist who recommended outpatient follow-up -EKG and troponin negative initially but we'll cycle -Continue statin and baby aspirin; apparently not on beta blocker secondary to severity of COPD    Hyperthyroidism -Uncertain of accuracy of this diagnosis -Check TSH    Chronic systolic congestive heart failure, NYHA class 3 ([place]) -Noted on echocardiogram and confirmed with cardiac catheterization in 2015 -Associated with poorly controlled hypertension and diffuse hypokinesis with 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    Mass of adrenal gland [facility]) -Incidental finding on MRI in 2015 -Recent CT the abdomen and pelvis August  2016 further documents this as "left adrenal mass 4.2 cm and consistent with benign adenoma based on previous MRI characterization"    Malnutrition of moderate degree ([place]) -Nutrition evaluation    Hard of hearing -Keep this in mind during patient education and patient interactions    Hyperglycemia -Check hemoglobin A1c    Anemia -Hgb stable and around baseline   DVT Prophylaxis: Lovenox  Family Communication:   No family at bedside  Code Status:  Full code  Condition:  Stable  Discharge disposition: Anticipate discharge back to home once medically stable  Time spent in minutes : 60      ELLIS,ALLISON L. ANP on 08/11/2015 at 4:33 PM  Between 7am to 7pm - Pager - [phone]  After 7pm go to [email or web address] - password [email or web address]  And look for the night coverage person covering me after hours  [facility]  I have evaluated the patient, reviewed the chart, modified the above note and discussed the plan with Erin Hearing, NP.  COPD exacerbation with ongoing tobacco abuse. Not requiring O2 at rest but has dyspnea on exertion. Would obtain ambulatory pulse ox on exertion tomorrow. Expect he will be ready to discharge home tomorrow.   Debbe Odea, MD Pager: [email or web address]

## 2015-08-11 NOTE — Evaluation (Signed)
Physical Therapy Evaluation Patient Details Name: Anthony Bolton MRN: [id number] DOB: 1951-06-12 Today's Date: 08/11/2015   History of Present Illness  This is a 64 year old male patient with a history of ongoing tobacco abuse with associated COPD, nonobstructed CAD per cardiac catheterization in 123456, chronic systolic congestive heart failure with an EF of 20-25%, dyslipidemia, apparent hyperthyroidism currently not on Tapazole, benign mass of the adrenal gland, and hard of hearing. Admitted for acute respiratory failure/COPD exacerbation.  Clinical Impression  Patient admitted with the above. Demonstrates minor instability with gait although did not require physical assist for balance. Limited distance due to fatigue; SpO2 86% on room air once ambulating 92-93% on room air at rest. Patient had very sudden and bizarre change in behavior at the end of our therapy evaluation. Initially he was very pleasant and thanked PT for assisting him however, when asking about his prior level of function he began screaming and cursing "Why the fuck do you care, I can take care of myself. You're just a physical therapist. Ask my doctor." He then proceeded to slam his face into his pillow and refused to answer further questions, including what upset him.   Follow Up Recommendations Other (comment) (cardiopulmonary rehab)    Equipment Recommendations  None recommended by PT    Recommendations for Other Services       Precautions / Restrictions Precautions Precautions: Fall Precaution Comments: monitor O2 Restrictions Weight Bearing Restrictions: No      Mobility  Bed Mobility Overal bed mobility: Independent                Transfers Overall transfer level: Modified independent               General transfer comment: extra time, minor sway no assist needed.  Ambulation/Gait Ambulation/Gait assistance: Supervision Ambulation Distance (Feet): 85 Feet Assistive device: None Gait  Pattern/deviations: Step-through pattern;Decreased stride length;Drifts right/left Gait velocity: decreased Gait velocity interpretation: Below normal speed for age/gender General Gait Details: Demonstrates minor sway. VC for awareness and energy conservation techniques. SpO2 to 86% on room air. Cues for pursed lip breathing. No bukcling or overt loss of balance requiring physical assist.   Stairs            Wheelchair Mobility    Modified Rankin (Stroke Patients Only)       Balance Overall balance assessment: Needs assistance Sitting-balance support: No upper extremity supported;Feet supported Sitting balance-Leahy Scale: Good     Standing balance support: No upper extremity supported Standing balance-Leahy Scale: Fair                               Pertinent Vitals/Pain Pain Assessment: No/denies pain    Home Living Family/patient expects to be discharged to:: Unsure Living Arrangements: Alone             Home Equipment:  (walking stick) Additional Comments: Takes a cab to get groceries. Limited information as patient became very agitated when asking history. After screaming and cursing at PT he refused to answer any questions concerning his history.    Prior Function Level of Independence: Independent with assistive device(s)         Comments: very limited activity per pt     Hand Dominance   Dominant Hand: Right    Extremity/Trunk Assessment   Upper Extremity Assessment: Defer to OT evaluation           Lower Extremity Assessment: Generalized weakness  Communication   Communication: HOH  Cognition Arousal/Alertness: Awake/alert Behavior During Therapy: Agitated Overall Cognitive Status: Difficult to assess                      General Comments General comments (skin integrity, edema, etc.): SpO2 down to 86% ambulating on room air. SpO2 92% at rest. Pt began screaming and cursing at therapist at end of  therapy session. Not sure why. States he is ready to go to bed.    Exercises        Assessment/Plan    PT Assessment Patient needs continued PT services  PT Diagnosis Difficulty walking;Abnormality of gait;Generalized weakness   PT Problem List Decreased strength;Decreased activity tolerance;Decreased balance;Decreased mobility;Decreased cognition;Decreased knowledge of use of DME;Decreased safety awareness;Cardiopulmonary status limiting activity  PT Treatment Interventions Gait training;DME instruction;Functional mobility training;Therapeutic activities;Therapeutic exercise;Balance training;Patient/family education;Neuromuscular re-education   PT Goals (Current goals can be found in the Care Plan section) Acute Rehab PT Goals Patient Stated Goal: "Leave me the fuck alone" PT Goal Formulation: With patient Time For Goal Achievement: 08/25/15 Potential to Achieve Goals: Fair    Frequency Min 3X/week   Barriers to discharge Decreased caregiver support lives alone    Co-evaluation               End of Session   Activity Tolerance: Patient limited by fatigue;Treatment limited secondary to agitation Patient left: in bed;with call bell/phone within reach;with bed alarm set Nurse Communication: Mobility status;Other (comment) (SpO2 93% at rest on room air when PT exited)    Functional Assessment Tool Used: clinical observation Functional Limitation: Mobility: Walking and moving around Mobility: Walking and Moving Around Current Status [id number]): At least 1 percent but less than 20 percent impaired, limited or restricted Mobility: Walking and Moving Around Goal Status [phone]): At least 1 percent but less than 20 percent impaired, limited or restricted    Time: 1746-1806 PT Time Calculation (min) (ACUTE ONLY): 20 min   Charges:   PT Evaluation $Initial PT Evaluation Tier I: 1 Procedure     PT G Codes:   PT G-Codes **NOT FOR INPATIENT CLASS** Functional Assessment Tool  Used: clinical observation Functional Limitation: Mobility: Walking and moving around Mobility: Walking and Moving Around Current Status [id number]): At least 1 percent but less than 20 percent impaired, limited or restricted Mobility: Walking and Moving Around Goal Status [phone]): At least 1 percent but less than 20 percent impaired, limited or restricted    Ellouise Newer 08/11/2015, 6:26 PM  Camille Bal [place], [place]

## 2015-08-11 NOTE — Progress Notes (Signed)
Patient refused bed alarm.    

## 2015-08-11 NOTE — ED Notes (Signed)
Per EMS- pt began having left chest pain and SOB starting last night. Pt was found to have wheezing bilaterally and was given 5mg  albuterol. Pt was out of his home inhaler. Pt has hx of COPD.

## 2015-08-11 NOTE — ED Provider Notes (Addendum)
CSN: [id number]     Arrival date & time 08/11/15  1223 History   First MD Initiated Contact with Patient 08/11/15 1224     Chief Complaint  Patient presents with  . Chest Pain  . Shortness of Breath     (Consider location/radiation/quality/duration/timing/severity/associated sxs/prior Treatment) HPI Comments: 64yo M w/ PMH including CHF, HLD, COPD, hearing loss who presents with chest pain and shortness of breath. The patient states that intermittently for the past 2 months, he has had brief episodes of left-sided nonradiating, sharp chest pain. The episodes last approximately 5 minutes. He began having intermittent chest pain overnight last night and he has had worsening shortness of breath recently. He ran out of his inhaler yesterday and tried to use his nebulizer machine with water instead. He has had a worsening dry cough recently. No fevers. No abdominal pain, vomiting, or leg swelling.  Patient is a 64 y.o. male presenting with chest pain and shortness of breath. The history is provided by the patient.  Chest Pain Associated symptoms: shortness of breath   Shortness of Breath Associated symptoms: chest pain     Past Medical History  Diagnosis Date  . COPD (chronic obstructive pulmonary disease) ([place])   . HOH (hard of hearing)   . CHF (congestive heart failure) (HCC) EF 20 - 25% in 2015  . HLD (hyperlipidemia)    Past Surgical History  Procedure Laterality Date  . Appendectomy    . Left heart catheterization with coronary angiogram N/A 11/07/2013    Procedure: LEFT HEART CATHETERIZATION WITH CORONARY ANGIOGRAM;  Surgeon: Clent Demark, MD;  Location: [facility] CATH LAB;  Service: Cardiovascular;  Laterality: N/A;  . Cardiac stents      No stent history   Family History  Problem Relation Age of Onset  . Stroke Mother    Social History  Substance Use Topics  . Smoking status: Current Every Day Smoker    Types: Cigarettes  . Smokeless tobacco: Never Used  . Alcohol Use: No     Review of Systems  Respiratory: Positive for shortness of breath.   Cardiovascular: Positive for chest pain.    10 Systems reviewed and are negative for acute change except as noted in the HPI.   Allergies  Review of patient's allergies indicates no known allergies.  Home Medications   Prior to Admission medications   Medication Sig Start Date End Date Taking? Authorizing Provider  albuterol (PROVENTIL) (2.5 MG/3ML) 0.083% nebulizer solution Take 3 mLs (2.5 mg total) by nebulization every 2 (two) hours as needed for wheezing or shortness of breath. 03/04/15   Maryann Mikhail, DO  aspirin EC 81 MG EC tablet Take 1 tablet (81 mg total) by mouth daily. 03/04/15   Maryann Mikhail, DO  atorvastatin (LIPITOR) 40 MG tablet Take 1 tablet (40 mg total) by mouth at bedtime. 03/04/15   Maryann Mikhail, DO  budesonide-formoterol (SYMBICORT) 160-4.5 MCG/ACT inhaler Inhale 2 puffs into the lungs 2 (two) times daily.    Historical Provider, MD  [employer] SLEEP AID 5 MG TABS TAKE10 MG BY MOUTH 30 MIN BEFORE BED TIME. 04/18/15   Historical Provider, MD  ipratropium-albuterol (DUONEB) 0.5-2.5 (3) MG/3ML SOLN Take 3 mLs by nebulization every 6 (six) hours. 05/08/15   Domenic Moras, PA-C  Multiple Vitamin (MULTIVITAMIN WITH MINERALS) TABS tablet Take 1 tablet by mouth daily. 03/04/15   Maryann Mikhail, DO  polyethylene glycol (MIRALAX / GLYCOLAX) packet Take 17 g by mouth daily. 05/08/15   Domenic Moras, PA-C  predniSONE (  DELTASONE) 20 MG tablet Take 3 tablets (60 mg total) by mouth daily with breakfast. 05/24/15   Orpah Greek, MD  PROAIR HFA 108 (90 BASE) MCG/ACT inhaler INHALE 2 PUFFS EVERY 4 HOURS AS NEEDED FOR WHEEZING OR SHORTNESS OF BREATH 04/18/15   Historical Provider, MD  SPIRIVA HANDIHALER 18 MCG inhalation capsule INHALE CONTENTS OF 1 CAPSULE USING HANDIHALER EVERY DAY FOR BREATHING 04/18/15   Historical Provider, MD  traZODone (DESYREL) 50 MG tablet Take 50 mg by mouth at bedtime.    Historical Provider, MD    BP 148/105 mmHg  Pulse 93  Temp(Src) 98.1 F (36.7 C) (Oral)  Resp 26  Ht 5\' 9"  (1.753 m)  Wt 117 lb (53.071 kg)  BMI 17.27 kg/m2  SpO2 97% Physical Exam  Constitutional: He is oriented to person, place, and time.  Thin, chronically ill-appearing man mildly tachypneic But in no acute distress  HENT:  Head: Normocephalic and atraumatic.  Mouth/Throat: Oropharynx is clear and moist.  Moist mucous membranes  Eyes: Conjunctivae are normal. Pupils are equal, round, and reactive to light.  Neck: Neck supple.  Cardiovascular: Normal rate, regular rhythm and normal heart sounds.   No murmur heard. Pulmonary/Chest: No respiratory distress.  Mildly increased WOB w/ occasional pursed lips, wheezes and diminished BS b/l  Abdominal: Soft. Bowel sounds are normal. He exhibits no distension. There is no tenderness.  Musculoskeletal: He exhibits no edema.  Neurological: He is alert and oriented to person, place, and time.  Fluent speech  Skin: Skin is warm and dry.  Psychiatric: He has a normal mood and affect. Judgment normal.  Nursing note and vitals reviewed.   ED Course  Procedures (including critical care time) Labs Review Labs Reviewed  BASIC METABOLIC PANEL - Abnormal; Notable for the following:    Sodium 131 (*)    Chloride 97 (*)    Glucose, Bld 129 (*)    Calcium 8.6 (*)    All other components within normal limits  CBC WITH DIFFERENTIAL/PLATELET - Abnormal; Notable for the following:    Hemoglobin 12.5 (*)    HCT 38.3 (*)    All other components within normal limits  BRAIN NATRIURETIC PEPTIDE  TROPONIN I  TROPONIN I  TROPONIN I  I-STAT TROPOININ, ED    Imaging Review Dg Chest 2 View  08/11/2015  CLINICAL DATA:  Left-sided chest pain and shortness of breath beginning last night. Initial encounter. EXAM: CHEST  2 VIEW COMPARISON:  PA and lateral chest 05/24/2015 and 03/25/2015. CT chest 03/25/2015. FINDINGS: The lungs are emphysematous but clear. Nipple shadows are  again seen. Heart size is normal. No pneumothorax or pleural effusion. No focal bony abnormality. IMPRESSION: Emphysema without acute disease. Electronically Signed   By: Inge Rise M.D.   On: 08/11/2015 13:12   I have personally reviewed and evaluated these lab results as part of my medical decision-making.   EKG Interpretation   Date/Time:  Sunday August 11 2015 12:32:43 EST Ventricular Rate:  89 PR Interval:  143 QRS Duration: 86 QT Interval:  368 QTC Calculation: 448 R Axis:   68 Text Interpretation:  Sinus rhythm Baseline wander in lead(s) V4 No  significant change since last tracing Confirmed by Deanglo Hissong MD, Alisah Grandberry  [phone]) on 08/11/2015 12:43:51 PM     Medications  albuterol (PROVENTIL,VENTOLIN) solution continuous neb (10 mg/hr Nebulization New Bag/Given 08/11/15 1438)  methylPREDNISolone sodium succinate (SOLU-MEDROL) 125 mg/2 mL injection 60 mg (not administered)  aspirin chewable tablet 324 mg (324 mg Oral Given  08/11/15 1243)  methylPREDNISolone sodium succinate (SOLU-MEDROL) 125 mg/2 mL injection 125 mg (125 mg Intravenous Given 08/11/15 1313)  ipratropium-albuterol (DUONEB) 0.5-2.5 (3) MG/3ML nebulizer solution 3 mL (3 mLs Nebulization Given 08/11/15 1312)    MDM   Final diagnoses:  COPD exacerbation (HCC)  Chest pain, unspecified chest pain type    Pt w/ h/o CHF and COPD p/w shortness of breath since he ran out of his inhaler as well as intermittent episodes of left-sided chest pain that have occurred for the past 2 months. Patient slightly dyspneic at presentation by EMS but in no acute distress. He had received albuterol during transport. He was slightly hypertensive but O2 sat 97% on room air. Wheezes and significantly diminished breath sounds bilaterally. Obtained EKG, chest x-ray, and above lab work. Gave the patient aspirin, Solu-Medrol, and a DuoNeb.  On reexamination, the patient's work of breathing has improved but he remains with diminished breath  sounds. I have ordered a 1 hour DuoNeb for better ventilation. I reviewed the patient's chart and he has a significant cardiac history including significantly reduced EF in 2015. At that time he was supposed to follow-up for consideration of ICD of his ejection fraction did not improve. The patient has been lost to follow-up. Given his ongoing episodes of chest pain in, patient with COPD exacerbation, I discussed patient with [facility] hospitalist and he will be admitted to general medicine for further care. I also spoke with Dr. Harrington Challenger, cardiology, who recommended talking to Dr. Terrence Dupont who did patient's catherization in 2015. Dr. Terrence Dupont recommended clinic follow up for chest pain work up. However, on re-examination after 1 hr continuous albuterol, pt continues to have Kennard Fildes air movement and severely diminished BS. Will continue albuterol and proceed w/ admission to medicine for COPD exacerbation.  Sharlett Iles, MD 08/11/15 [place], MD 08/11/15 [phone]

## 2015-08-12 ENCOUNTER — Encounter (HOSPITAL_COMMUNITY): Payer: Self-pay | Admitting: General Practice

## 2015-08-12 ENCOUNTER — Observation Stay (HOSPITAL_COMMUNITY): Payer: Medicare Other

## 2015-08-12 DIAGNOSIS — E860 Dehydration: Secondary | ICD-10-CM | POA: Diagnosis present

## 2015-08-12 DIAGNOSIS — E871 Hypo-osmolality and hyponatremia: Secondary | ICD-10-CM | POA: Diagnosis present

## 2015-08-12 DIAGNOSIS — Z79899 Other long term (current) drug therapy: Secondary | ICD-10-CM | POA: Diagnosis not present

## 2015-08-12 DIAGNOSIS — Z7982 Long term (current) use of aspirin: Secondary | ICD-10-CM | POA: Diagnosis not present

## 2015-08-12 DIAGNOSIS — D638 Anemia in other chronic diseases classified elsewhere: Secondary | ICD-10-CM | POA: Diagnosis present

## 2015-08-12 DIAGNOSIS — I509 Heart failure, unspecified: Secondary | ICD-10-CM | POA: Diagnosis not present

## 2015-08-12 DIAGNOSIS — E785 Hyperlipidemia, unspecified: Secondary | ICD-10-CM | POA: Diagnosis present

## 2015-08-12 DIAGNOSIS — Z681 Body mass index (BMI) 19 or less, adult: Secondary | ICD-10-CM | POA: Diagnosis not present

## 2015-08-12 DIAGNOSIS — I2510 Atherosclerotic heart disease of native coronary artery without angina pectoris: Secondary | ICD-10-CM | POA: Diagnosis present

## 2015-08-12 DIAGNOSIS — E279 Disorder of adrenal gland, unspecified: Secondary | ICD-10-CM | POA: Diagnosis present

## 2015-08-12 DIAGNOSIS — R0789 Other chest pain: Secondary | ICD-10-CM | POA: Diagnosis not present

## 2015-08-12 DIAGNOSIS — F17210 Nicotine dependence, cigarettes, uncomplicated: Secondary | ICD-10-CM | POA: Diagnosis present

## 2015-08-12 DIAGNOSIS — E0590 Thyrotoxicosis, unspecified without thyrotoxic crisis or storm: Secondary | ICD-10-CM | POA: Diagnosis present

## 2015-08-12 DIAGNOSIS — H9190 Unspecified hearing loss, unspecified ear: Secondary | ICD-10-CM | POA: Diagnosis present

## 2015-08-12 DIAGNOSIS — E440 Moderate protein-calorie malnutrition: Secondary | ICD-10-CM | POA: Diagnosis present

## 2015-08-12 DIAGNOSIS — I110 Hypertensive heart disease with heart failure: Secondary | ICD-10-CM | POA: Diagnosis present

## 2015-08-12 DIAGNOSIS — J9601 Acute respiratory failure with hypoxia: Secondary | ICD-10-CM | POA: Diagnosis present

## 2015-08-12 DIAGNOSIS — I5022 Chronic systolic (congestive) heart failure: Secondary | ICD-10-CM | POA: Diagnosis present

## 2015-08-12 DIAGNOSIS — J441 Chronic obstructive pulmonary disease with (acute) exacerbation: Secondary | ICD-10-CM | POA: Diagnosis not present

## 2015-08-12 LAB — GLUCOSE, CAPILLARY
GLUCOSE-CAPILLARY: 122 mg/dL — AB (ref 65–99)
Glucose-Capillary: 162 mg/dL — ABNORMAL HIGH (ref 65–99)
Glucose-Capillary: 114 mg/dL — ABNORMAL HIGH (ref 65–99)
Glucose-Capillary: 107 mg/dL — ABNORMAL HIGH (ref 65–99)

## 2015-08-12 LAB — COMPREHENSIVE METABOLIC PANEL
ALK PHOS: 46 U/L (ref 38–126)
ALT: 12 U/L — AB (ref 17–63)
ANION GAP: 7 (ref 5–15)
AST: 22 U/L (ref 15–41)
Albumin: 3.2 g/dL — ABNORMAL LOW (ref 3.5–5.0)
BILIRUBIN TOTAL: 0.4 mg/dL (ref 0.3–1.2)
BUN: 15 mg/dL (ref 6–20)
CALCIUM: 8.7 mg/dL — AB (ref 8.9–10.3)
CO2: 26 mmol/L (ref 22–32)
CREATININE: 0.7 mg/dL (ref 0.61–1.24)
Chloride: 98 mmol/L — ABNORMAL LOW (ref 101–111)
GFR calc Af Amer: >60 mL/min (ref >60)
GFR calc non Af Amer: >60 mL/min (ref >60)
GLUCOSE: 212 mg/dL — AB (ref 65–99)
Potassium: 4 mmol/L (ref 3.5–5.1)
Sodium: 131 mmol/L — ABNORMAL LOW (ref 135–145)
TOTAL PROTEIN: 5.4 g/dL — AB (ref 6.5–8.1)

## 2015-08-12 LAB — CBC
HCT: 34.9 % — ABNORMAL LOW (ref 39.0–52.0)
HEMOGLOBIN: 11.3 g/dL — AB (ref 13.0–17.0)
MCH: 29 pg (ref 26.0–34.0)
MCHC: 32.4 g/dL (ref 30.0–36.0)
MCV: 89.7 fL (ref 78.0–100.0)
Platelets: 156 10*3/uL (ref 150–400)
RBC: 3.89 MIL/uL — ABNORMAL LOW (ref 4.22–5.81)
RDW: 14.1 % (ref 11.5–15.5)
WBC: 5.3 10*3/uL (ref 4.0–10.5)

## 2015-08-12 LAB — LIPID PANEL
CHOLESTEROL: 172 mg/dL (ref 0–200)
HDL: 56 mg/dL (ref >40)
LDL Cholesterol: 107 mg/dL — ABNORMAL HIGH (ref 0–99)
TRIGLYCERIDES: 44 mg/dL (ref <150)
Total CHOL/HDL Ratio: 3.1 RATIO
VLDL: 9 mg/dL (ref 0–40)

## 2015-08-12 LAB — HEMOGLOBIN A1C
Hgb A1c MFr Bld: 5.8 % — ABNORMAL HIGH (ref 4.8–5.6)
Mean Plasma Glucose: 120 mg/dL

## 2015-08-12 LAB — TROPONIN I

## 2015-08-12 LAB — T4, FREE: FREE T4: 0.99 ng/dL (ref 0.61–1.12)

## 2015-08-12 LAB — TSH: TSH: 0.018 u[IU]/mL — ABNORMAL LOW (ref 0.350–4.500)

## 2015-08-12 MED ORDER — PREDNISONE 20 MG PO TABS
50.0000 mg | ORAL_TABLET | Freq: Every day | ORAL | Status: DC
  Administered 2015-08-13: 50 mg via ORAL
  Filled 2015-08-12: qty 2
  Filled 2015-08-12: qty 1

## 2015-08-12 MED ORDER — INSULIN ASPART 100 UNIT/ML ~~LOC~~ SOLN
0.0000 [IU] | Freq: Three times a day (TID) | SUBCUTANEOUS | Status: DC
  Administered 2015-08-12: 1 [IU] via SUBCUTANEOUS
  Administered 2015-08-12: 2 [IU] via SUBCUTANEOUS

## 2015-08-12 MED ORDER — INSULIN ASPART 100 UNIT/ML ~~LOC~~ SOLN: 0.0000 [IU] | Freq: Every day | SUBCUTANEOUS | Status: DC

## 2015-08-12 MED ORDER — METHIMAZOLE 10 MG PO TABS
20.0000 mg | ORAL_TABLET | Freq: Two times a day (BID) | ORAL | Status: DC
  Administered 2015-08-12 – 2015-08-13 (×3): 20 mg via ORAL
  Filled 2015-08-12 (×4): qty 2

## 2015-08-12 MED ORDER — GLUCERNA SHAKE PO LIQD
237.0000 mL | Freq: Three times a day (TID) | ORAL | Status: DC
  Administered 2015-08-12 – 2015-08-13 (×3): 237 mL via ORAL

## 2015-08-12 NOTE — Progress Notes (Signed)
Patient Demographics:    Anthony Bolton, is a 64 y.o. male, DOB - Dec 03, 1950, [id number]  Admit date - 08/11/2015   Admitting Physician Debbe Odea, MD  Outpatient Primary MD for the patient is [name], MD  LOS -    Chief Complaint  Patient presents with  . Chest Pain  . Shortness of Breath        Subjective:    [name] today has, No headache, No chest pain, No abdominal pain - No Nausea, No new weakness tingling or numbness, No Cough - SOB.     Assessment  & Plan :    1. Acute hypoxic respiratory failure due to COPD exacerbation. Much improved, off oxygen, will transition to oral prednisone, continue azithromycin, continue Laniazid treatments as needed. Likely discharge in the morning.   2. History of smoking. Counseled to quit.   3. CAD with chronic systolic heart failure asked EF 30%. No edema on exam, repeat echo pending, currently appears compensated, continue combination of aspirin, statin, blood pressure too low for beta blocker or ACE/ARB.   4. Hyper thyroidism. Discussed with endocrinologist Dr. Loanne Drilling placed on methimazole, follow with endocrine within the next 2-3 days post discharge.   5. History of adrenal mass found on CT scan August 2016. Outpatient follow-up with both endocrine and PCP post discharge for this. This was found outpatient months before this admission.   6. Dyslipidemia. Continue home dose Synthroid.   7.anemia of chronic disease. Stable no acute issues outpatient workup.    Code Status : Full  Family Communication  : none   Disposition Plan  : Home 1-2 days  Consults  :  Endo over the phone  Procedures  :   TTE  DVT Prophylaxis  :  Lovenox   Lab Results  Component Value Date   PLT 156 08/12/2015    Inpatient  Medications  Scheduled Meds: . aspirin EC  81 mg Oral Daily  . atorvastatin  40 mg Oral QHS  . azithromycin  250 mg Oral Daily  . enoxaparin (LOVENOX) injection  40 mg Subcutaneous Q24H  . feeding supplement (GLUCERNA SHAKE)  237 mL Oral TID BM  . insulin aspart  0-5 Units Subcutaneous QHS  . insulin aspart  0-9 Units Subcutaneous TID WC  . ipratropium-albuterol  3 mL Nebulization QID  . methylPREDNISolone (SOLU-MEDROL) injection  60 mg Intravenous Q6H  . multivitamin with minerals  1 tablet Oral Daily  . sodium chloride  3 mL Intravenous Q12H  . traZODone  50 mg Oral QHS   Continuous Infusions:  PRN Meds:.acetaminophen **OR** acetaminophen, albuterol, ipratropium-albuterol, polyethylene glycol  Antibiotics  :   Anti-infectives    Start     Dose/Rate Route Frequency Ordered Stop   08/12/15 1000  azithromycin (ZITHROMAX) tablet 250 mg     250 mg Oral Daily 08/11/15 1519 08/16/15 0959   08/11/15 1530  azithromycin (ZITHROMAX) tablet 500 mg     500 mg Oral Daily 08/11/15 1519 08/11/15 1539        Objective:   Filed Vitals:   08/11/15 2139 08/12/15 0455 08/12/15 0733 08/12/15 1109  BP: 103/76 104/70    Pulse: 88 83    Temp: 98.3 F (36.8 C) 98.1 F (36.7 C)  TempSrc: Oral Oral    Resp: 16     Height:      Weight:  50.576 kg (111 lb 8 oz)    SpO2: 94% 90% 93% 92%    Wt Readings from Last 3 Encounters:  08/12/15 50.576 kg (111 lb 8 oz)  05/04/15 47.4 kg (104 lb 8 oz)  04/30/15 47.2 kg (104 lb 0.9 oz)     Intake/Output Summary (Last 24 hours) at 08/12/15 1205 Last data filed at 08/12/15 0820  Gross per 24 hour  Intake    540 ml  Output    400 ml  Net    140 ml     Physical Exam  Awake Alert, Oriented X 3, No new F.N deficits, Normal affect [place].AT,PERRAL Supple Neck,No JVD, No cervical lymphadenopathy appriciated.  Symmetrical Chest wall movement, Good air movement bilaterally, mild wheezing RRR,No Gallops,Rubs or new Murmurs, No Parasternal Heave +ve  B.Sounds, Abd Soft, No tenderness, No organomegaly appriciated, No rebound - guarding or rigidity. No Cyanosis, Clubbing or edema, No new Rash or bruise     Data Review:   Micro Results No results found for this or any previous visit (from the past 240 hour(s)).  Radiology Reports Dg Chest 2 View  08/11/2015  CLINICAL DATA:  Left-sided chest pain and shortness of breath beginning last night. Initial encounter. EXAM: CHEST  2 VIEW COMPARISON:  PA and lateral chest 05/24/2015 and 03/25/2015. CT chest 03/25/2015. FINDINGS: The lungs are emphysematous but clear. Nipple shadows are again seen. Heart size is normal. No pneumothorax or pleural effusion. No focal bony abnormality. IMPRESSION: Emphysema without acute disease. Electronically Signed   By: Inge Rise M.D.   On: 08/11/2015 13:12     CBC  Recent Labs Lab 08/11/15 1242 08/12/15 0325  WBC 6.4 5.3  HGB 12.5* 11.3*  HCT 38.3* 34.9*  PLT 152 156  MCV 90.3 89.7  MCH 29.5 29.0  MCHC 32.6 32.4  RDW 14.0 14.1  LYMPHSABS 1.2  --   MONOABS 0.5  --   EOSABS 0.2  --   BASOSABS 0.0  --     Chemistries   Recent Labs Lab 08/11/15 1242 08/12/15 0325  NA 131* 131*  K 3.8 4.0  CL 97* 98*  CO2 25 26  GLUCOSE 129* 212*  BUN 13 15  CREATININE 0.67 0.70  CALCIUM 8.6* 8.7*  AST  --  22  ALT  --  12*  ALKPHOS  --  46  BILITOT  --  0.4   ------------------------------------------------------------------------------------------------------------------ estimated creatinine clearance is 65.9 mL/min (by C-G formula based on Cr of 0.7). ------------------------------------------------------------------------------------------------------------------  Recent Labs  08/11/15 1720  HGBA1C 5.8*   ------------------------------------------------------------------------------------------------------------------  Recent Labs  08/12/15 0325  CHOL 172  HDL 56  LDLCALC 107*  TRIG 44  CHOLHDL 3.1    ------------------------------------------------------------------------------------------------------------------  Recent Labs  08/12/15 0807  TSH 0.018*   ------------------------------------------------------------------------------------------------------------------ No results for input(s): VITAMINB12, FOLATE, FERRITIN, TIBC, IRON, RETICCTPCT in the last 72 hours.  Coagulation profile No results for input(s): INR, PROTIME in the last 168 hours.  No results for input(s): DDIMER in the last 72 hours.  Cardiac Enzymes  Recent Labs Lab 08/11/15 1720 08/11/15 2030 08/12/15 0325  TROPONINI <0.03 <0.03 <0.03   ------------------------------------------------------------------------------------------------------------------ Invalid input(s): POCBNP   Time Spent in minutes  35   SINGH,PRASHANT K M.D on 08/12/2015 at 12:05 PM  Between 7am to 7pm - Pager - [phone]  After 7pm go to [email or web address] - password [email or web address]  [facility] -  Office  [phone]

## 2015-08-12 NOTE — Progress Notes (Signed)
Initial Nutrition Assessment  DOCUMENTATION CODES:   Underweight, Severe malnutrition in context of chronic illness  INTERVENTION:    Glucerna Shake po TID, each supplement provides 220 kcal and 10 grams of protein  NUTRITION DIAGNOSIS:   Malnutrition related to chronic illness as evidenced by severe depletion of body fat, severe depletion of muscle mass.  GOAL:   Patient will meet greater than or equal to 90% of their needs  MONITOR:   PO intake, Supplement acceptance, Labs, Weight trends  REASON FOR ASSESSMENT:   Consult Assessment of nutrition requirement/status  ASSESSMENT:   64 year old male patient with a history of ongoing tobacco abuse with associated COPD, nonobstructed CAD per cardiac catheterization in [id number], chronic systolic congestive heart failure with an EF of 20-25%, dyslipidemia, apparent hyperthyroidism currently not on Tapazole, benign mass of the adrenal gland, and hard of hearing. Patient was recently hospitalized in August 2016 for problems related to partial small bowel obstruction that resolved with conservative management. Of note based on therapy evaluations it was recommended patient be discharged to skilled nursing facility but he refused. Patient presents to the ER today with reports of chest tightness and shortness of breath.  Nutrition-Focused physical exam completed. Findings are severe fat depletion, severe muscle depletion, and no edema. Patient with severe PCM in the context of chronic illness (COPD). He is eating well, 100% meal completion, and has gained a little weight over the past few months.   Diet Order:  Diet Heart Room service appropriate?: Yes; Fluid consistency:: Thin  Skin:  Reviewed, no issues  Last BM:  unknown  Height:   Ht Readings from Last 1 Encounters:  08/11/15 5\' 9"  (1.753 m)    Weight:   Wt Readings from Last 1 Encounters:  08/12/15 111 lb 8 oz (50.576 kg)    Ideal Body Weight:  72.7 kg  BMI:  Body mass  index is 16.46 kg/(m^2).  Estimated Nutritional Needs:   Kcal:  1700-1900  Protein:  85-95 gm  Fluid:  1.7-1.9 L  EDUCATION NEEDS:   Education needs addressed  Molli Barrows, [place], [place], [place] Pager [phone] After Hours Pager [phone]

## 2015-08-12 NOTE — Progress Notes (Signed)
  Echocardiogram 2D Echocardiogram has been performed.  Johny Chess 08/12/2015, 8:52 AM

## 2015-08-12 NOTE — Care Management Note (Addendum)
Case Management Note  Patient Details  Name: DIVON TEAGLE MRN: [id number] Date of Birth: 1951/06/13  Subjective/Objective: Pt admitted for  COPD Exacerbation. Pt is from home alone. Per pt he takes Cabs to MD appointments and this takes his money. CM did speak with pt in ref to [employer]. CM did call CSW and she stated pt will need to contact SW @ Social Services and pt can fill out application. Referral received for medication assistance. Pt has Medicaid and cost should be no more than $3.00. Pt states he uses [employer] representative for medications. CM will be unable to assist with medications due to insurance and cost is manageable.                    Action/Plan: CM did call [place] Liaison for [facility] to see if she could assist as well. Pt may benefit from [facility] Services once stable. Pt is very HOH. Pt has PCP- Lucianne Lei. CM will continue to monitor.   Expected Discharge Date:                  Expected Discharge Plan:  [place]  In-House Referral:  NA  Discharge planning Services  CM Consult  Post Acute Care Choice:  NA Choice offered to:  NA  DME Arranged:   NA DME Agency:   NA  HH Arranged:   NA HH Agency:   NA  Status of Service: Completed Medicare Important Message Given:    Date Medicare IM Given:    Medicare IM give by:    Date Additional Medicare IM Given:    Additional Medicare Important Message give by:     If discussed at [facility] of Stay Meetings, dates discussed:    Additional Comments: 1007 08-13-15 Jacqlyn Krauss, RN,BSN [phone] CM did speak with pt in regards to disposition needs. Pt is currently living at [facility] in [place]. Pt's contact number is [phone]. CM did reach out to [facility] [place] sent referral to [facility]. They will f/u with pt in regards to [facility] Transportation needs, Housing, Patient and Disease Management. CM will provide pt the numbers  to [place], [phone] and [phone]. CM did speak with SW in reference to cab ride home. DME Nebulizer machine to be delivered to room via [facility] before d/c. [facility] checked records and pt received Nebulizer when he was at [facility]. No DME needs. No further needs from CM at this time.   Bethena Roys, RN 08/12/2015, 2:04 PM

## 2015-08-12 NOTE — Progress Notes (Signed)
Patient refuse for [place] to put the bed alarm and states he does not need it. I explained to him why we were cutting the bed alarm on and he still refused. Will continue to monitor.

## 2015-08-12 NOTE — Evaluation (Signed)
Occupational Therapy Evaluation Patient Details Name: Anthony Bolton MRN: [id number] DOB: 08/21/1951 Today's Date: 08/12/2015    History of Present Illness This is a 64 year old male patient with a history of ongoing tobacco abuse with associated COPD, nonobstructed CAD per cardiac catheterization in 123456, chronic systolic congestive heart failure with an EF of 20-25%, dyslipidemia, apparent hyperthyroidism currently not on Tapazole, benign mass of the adrenal gland, and hard of hearing. Admitted for acute respiratory failure/COPD exacerbation.   Clinical Impression   Pt demonstrating ability to perform mobility, toileting, grooming and eating at a modified independent to independent level.  Pt guarded when questioned about home situation.  Per nurse assistant, pt eats a lot.  Pt noted to be saving condiments from meal trays to take home.    Follow Up Recommendations  No OT follow up    Equipment Recommendations   (pt declined equipment)    Recommendations for Other Services       Precautions / Restrictions Precautions Precautions: Fall Precaution Comments: monitor O2 Restrictions Weight Bearing Restrictions: No      Mobility Bed Mobility Overal bed mobility: Independent                Transfers Overall transfer level: Modified independent Equipment used: None                  Balance     Sitting balance-Leahy Scale: Good       Standing balance-Leahy Scale: Good                              ADL Overall ADL's : At baseline                                       General ADL Comments: Pt returning from ECHO lab, observed him as he got out of w/c, ambulated to bathroom, washed hands and got into bed independently. Opened creamer and unwrapped spoon and set up coffee independently.     Vision Vision Assessment?: No apparent visual deficits   Perception     Praxis      Pertinent Vitals/Pain Pain Assessment:  No/denies pain     Hand Dominance Right   Extremity/Trunk Assessment Upper Extremity Assessment Upper Extremity Assessment: Overall WFL for tasks assessed   Lower Extremity Assessment Lower Extremity Assessment: Defer to PT evaluation   Cervical / Trunk Assessment Cervical / Trunk Assessment: Normal   Communication Communication Communication: HOH   Cognition Arousal/Alertness: Awake/alert Behavior During Therapy: WFL for tasks assessed/performed Overall Cognitive Status: Difficult to assess                     General Comments       Exercises       Shoulder Instructions      Home Living Family/patient expects to be discharged to:: Unsure Living Arrangements: Alone                                      Prior Functioning/Environment          Comments: pt guarded when asked about home situation and PLOF    OT Diagnosis: Generalized weakness   OT Problem List:     OT Treatment/Interventions:      OT Goals(Current goals can  be found in the care plan section) Acute Rehab OT Goals Patient Stated Goal: Coffee  OT Frequency:     Barriers to D/C:            Co-evaluation              End of Session Nurse Communication:  (ok to give pt coffee)  Activity Tolerance: Patient tolerated treatment well Patient left: in bed;with call bell/phone within reach   Time: 0855-0910 OT Time Calculation (min): 15 min Charges:  OT General Charges $OT Visit: 1 Procedure OT Evaluation $Initial OT Evaluation Tier I: 1 Procedure G-Codes: OT G-codes **NOT FOR INPATIENT CLASS** Functional Assessment Tool Used: clinical judgement Functional Limitation: Self care Self Care Current Status [id number]): At least 1 percent but less than 20 percent impaired, limited or restricted Self Care Discharge Status [phone]): At least 1 percent but less than 20 percent impaired, limited or restricted  [name] 08/12/2015, 9:15 AM  [phone]

## 2015-08-12 NOTE — Progress Notes (Signed)
OT Cancellation Note  Patient Details Name: Anthony Bolton MRN: [id number] DOB: 11/13/1950   Cancelled Treatment:    Reason Eval/Treat Not Completed: Patient at procedure or test/ unavailable. Pt currently in ECHO lab, will continue to follow.  [name] 08/12/2015, 8:33 AM

## 2015-08-13 LAB — GLUCOSE, CAPILLARY
GLUCOSE-CAPILLARY: 119 mg/dL — AB (ref 65–99)
Glucose-Capillary: 94 mg/dL (ref 65–99)

## 2015-08-13 LAB — T3: T3, Total: 72 ng/dL (ref 71–180)

## 2015-08-13 LAB — HEMOGLOBIN A1C
Hgb A1c MFr Bld: 5.6 % (ref 4.8–5.6)
Mean Plasma Glucose: 114 mg/dL

## 2015-08-13 MED ORDER — BUDESONIDE-FORMOTEROL FUMARATE 160-4.5 MCG/ACT IN AERO: 2.0000 | INHALATION_SPRAY | Freq: Two times a day (BID) | RESPIRATORY_TRACT | Status: DC

## 2015-08-13 MED ORDER — PREDNISONE 5 MG PO TABS: ORAL_TABLET | ORAL | Status: DC

## 2015-08-13 MED ORDER — SPIRIVA HANDIHALER 18 MCG IN CAPS: 18.0000 ug | ORAL_CAPSULE | Freq: Every day | RESPIRATORY_TRACT | Status: DC

## 2015-08-13 MED ORDER — ALBUTEROL SULFATE (2.5 MG/3ML) 0.083% IN NEBU: 2.5000 mg | INHALATION_SOLUTION | RESPIRATORY_TRACT | Status: DC | PRN

## 2015-08-13 MED ORDER — AZITHROMYCIN 250 MG PO TABS: 250.0000 mg | ORAL_TABLET | Freq: Every day | ORAL | Status: DC

## 2015-08-13 MED ORDER — METHIMAZOLE 10 MG PO TABS: 20.0000 mg | ORAL_TABLET | Freq: Two times a day (BID) | ORAL | Status: DC

## 2015-08-13 MED ORDER — POLYETHYLENE GLYCOL 3350 17 G PO PACK: 17.0000 g | PACK | Freq: Every day | ORAL | Status: DC | PRN

## 2015-08-13 NOTE — Discharge Instructions (Signed)
Follow with Primary MD [name], MD in 7 days   Get CBC, CMP, 2 view Chest X ray checked  by Primary MD next visit.    Activity: As tolerated with Full fall precautions use walker/cane & assistance as needed   Disposition Home     Diet: Heart Healthy    For Heart failure patients - Check your Weight same time everyday, if you gain over 2 pounds, or you develop in leg swelling, experience more shortness of breath or chest pain, call your Primary MD immediately. Follow Cardiac Low Salt Diet and 1.5 lit/day fluid restriction.   On your next visit with your primary care physician please Get Medicines reviewed and adjusted.   Please request your Prim.MD to go over all Hospital Tests and Procedure/Radiological results at the follow up, please get all Hospital records sent to your Prim MD by signing hospital release before you go home.   If you experience worsening of your admission symptoms, develop shortness of breath, life threatening emergency, suicidal or homicidal thoughts you must seek medical attention immediately by calling 911 or calling your MD immediately  if symptoms less severe.  You Must read complete instructions/literature along with all the possible adverse reactions/side effects for all the Medicines you take and that have been prescribed to you. Take any new Medicines after you have completely understood and accpet all the possible adverse reactions/side effects.   Do not drive, operating heavy machinery, perform activities at heights, swimming or participation in water activities or provide baby sitting services if your were admitted for syncope or siezures until you have seen by Primary MD or a Neurologist and advised to do so again.  Do not drive when taking Pain medications.    Do not take more than prescribed Pain, Sleep and Anxiety Medications  Special Instructions: If you have smoked or chewed Tobacco  in the last 2 yrs please stop smoking, stop any regular  Alcohol  and or any Recreational drug use.  Wear Seat belts while driving.   Please note  You were cared for by a hospitalist during your hospital stay. If you have any questions about your discharge medications or the care you received while you were in the hospital after you are discharged, you can call the unit and asked to speak with the hospitalist on call if the hospitalist that took care of you is not available. Once you are discharged, your primary care physician will handle any further medical issues. Please note that NO REFILLS for any discharge medications will be authorized once you are discharged, as it is imperative that you return to your primary care physician (or establish a relationship with a primary care physician if you do not have one) for your aftercare needs so that they can reassess your need for medications and monitor your lab values.

## 2015-08-13 NOTE — Progress Notes (Signed)
Physical Therapy Treatment Patient Details Name: Anthony Bolton MRN: [id number] DOB: [birth year] Today's Date: 08/13/2015    History of Present Illness This is a 64 year old male patient with a history of ongoing tobacco abuse with associated COPD, nonobstructed CAD per cardiac catheterization in 123456, chronic systolic congestive heart failure with an EF of 20-25%, dyslipidemia, apparent hyperthyroidism currently not on Tapazole, benign mass of the adrenal gland, and hard of hearing. Admitted for acute respiratory failure/COPD exacerbation.    PT Comments    Anthony Bolton was extremely agitated upon PT arrival and was very erratic and unsteady ambulating in hallway today.  Pt will benefit from continued skilled PT services to increase functional independence and safety.  Follow Up Recommendations  Other (comment) (cardiopulmonary rehab)     Equipment Recommendations  None recommended by PT    Recommendations for Other Services       Precautions / Restrictions Precautions Precautions: Fall Precaution Comments: monitor O2 Restrictions Weight Bearing Restrictions: No    Mobility  Bed Mobility Overal bed mobility: Independent                Transfers Overall transfer level: Modified independent               General transfer comment: Minor sway as pt moves swiftly OOB as he is agitated  Ambulation/Gait Ambulation/Gait assistance: Supervision Ambulation Distance (Feet): 70 Feet Assistive device: None Gait Pattern/deviations: Step-through pattern;Staggering left;Staggering right;Antalgic     General Gait Details: Pt very agitated and quickly ambulates in hallway very erratically, showing his frustration with getting OOB.  Unable to monitor O2 sats as pt flinging his arms in the air asking, "See, I can walk just fine!"   Stairs            Wheelchair Mobility    Modified Rankin (Stroke Patients Only)       Balance Overall balance assessment: Needs  assistance Sitting-balance support: No upper extremity supported;Feet supported Sitting balance-Leahy Scale: Good     Standing balance support: During functional activity;No upper extremity supported Standing balance-Leahy Scale: Fair                      Cognition Arousal/Alertness: Awake/alert Behavior During Therapy: Agitated Overall Cognitive Status: Difficult to assess                      Exercises General Exercises - Lower Extremity Ankle Circles/Pumps: AROM;Both;10 reps;Supine Straight Leg Raises: AROM;Both;10 reps;Supine    General Comments General comments (skin integrity, edema, etc.): Pt allows PT to check O2 sats once pt supine in bed, SpO2 at 97% on RA      Pertinent Vitals/Pain Pain Assessment: No/denies pain    Home Living                      Prior Function            PT Goals (current goals can now be found in the care plan section) Acute Rehab PT Goals Patient Stated Goal: "Are you happy?  I can walk just fine!" PT Goal Formulation: With patient Time For Goal Achievement: 08/25/15 Potential to Achieve Goals: Fair Progress towards PT goals: Progressing toward goals    Frequency  Min 3X/week    PT Plan Current plan remains appropriate    Co-evaluation             End of Session   Activity Tolerance: Treatment limited secondary to agitation Patient  left: in bed;with call bell/phone within reach     Time: 1120-1130 PT Time Calculation (min) (ACUTE ONLY): 10 min  Charges:  $Gait Training: 8-22 mins                    G Codes:      Anthony Bolton PT, [place] [id number] Pager: [phone] 08/13/2015, 11:42 AM

## 2015-08-13 NOTE — Clinical Social Work Note (Signed)
[name] requested assistance with transportation after being ready for discharge.  CSW Intern spoke with patient to confirm the location he was being transported to. After receiving address information, CSW Intern provided patient's RN with cab voucher. Nurse stated she would call cab once patient was ready. [name] had no further questions or concerns.

## 2015-08-13 NOTE — Progress Notes (Signed)
Called to room by nurse tech. Patient being combative and refused to stand for his daily weight. Explained to patient why we needed to weigh him daily and he still refused. Will pass off to day shift RN that he still needs a weight. Will continue to monitor.

## 2015-08-13 NOTE — Progress Notes (Signed)
Discharge teaching and instructions reviewed with pt. Pt understands discharge teaching. Pt handed prescription medications. Pt being discharged via [employer] [facility] to [facility].

## 2015-08-13 NOTE — Discharge Summary (Signed)
Anthony Bolton, is a 64 y.o. male  DOB 08/21/1951  MRN [id number].  Admission date:  08/11/2015  Admitting Physician  Debbe Odea, MD  Discharge Date:  08/13/2015   Primary MD  [name], MD  Recommendations for primary care physician for things to follow:   Check BMP, two-view chest x-ray in a week. Outpatient pulmonary follow-up and endocrine follow-up recommended within a week.   Admission Diagnosis  COPD exacerbation (HCC) [J44.1] Chest pain, unspecified chest pain type [R07.9]   Discharge Diagnosis  COPD exacerbation ([place]) [J44.1] Chest pain, unspecified chest pain type [R07.9]     Principal Problem:   COPD exacerbation ([place]) Active Problems:   Mass of adrenal gland ([place])   Tobacco abuse   Hyperthyroidism   Chronic systolic congestive heart failure, NYHA class 3 (HCC)   Malnutrition of moderate degree (HCC)   HLD (hyperlipidemia)   Acute respiratory failure (HCC)   Hard of hearing   CAD (nonobstructive per cath 2015)   HTN (hypertension)      Past Medical History  Diagnosis Date  . COPD (chronic obstructive pulmonary disease) ([place])   . HOH (hard of hearing)   . CHF (congestive heart failure) (HCC) EF 20 - 25% in 2015  . HLD (hyperlipidemia)     Past Surgical History  Procedure Laterality Date  . Appendectomy    . Left heart catheterization with coronary angiogram N/A 11/07/2013    Procedure: LEFT HEART CATHETERIZATION WITH CORONARY ANGIOGRAM;  Surgeon: Clent Demark, MD;  Location: [facility] CATH LAB;  Service: Cardiovascular;  Laterality: N/A;  . Cardiac stents      No stent history       HPI  from the history and physical done on the day of admission:     This is a 64 year old male patient with a history of ongoing tobacco abuse with associated COPD, nonobstructed CAD per cardiac  catheterization in 123456, chronic systolic congestive heart failure with an EF of 20-25%, dyslipidemia, apparent hyperthyroidism currently not on Tapazole, benign mass of the adrenal gland, and hard of hearing. Patient was recently hospitalized in August 2016 for problems related to partial small bowel obstruction that resolved with conservative management. Of note based on therapy evaluations it was recommended patient be discharged to skilled nursing facility but he refused. Patient presents to the ER today with reports of chest tightness and shortness of breath. He reported to the EDP recurrent issues with shortness of breath for the past 2 months. In further clarification when I examined the patient he's been treated on multiple occasions over the past 2 months for bronchitis and COPD exacerbations either by his primary care physician or at local hospitals. He is an inconsistent historian. He apparently completed at course of tapering steroids and antibiotics about one month ago. In regards to today's symptoms he awakened around 4:00 this morning with sharp left-sided chest pain and was unable to sleep because of this. He used water only and his nebulizer because he had run out of his medications which  did not improve his symptoms. In review of his medication reconciliation sheet it appears the patient is out of the majority of his medications. He reports that for several months he's had a cough with opaque to brownish secretions. He's had chronic atypical chest pain for years. He's had issues with malnutrition and weight loss other he thinks recently he has gained weight. No fevers or chills. No definitive dyspnea on exertion. No orthopnea.  In the ER the patient was mildly hypertensive with a blood pressure of 154/126 which improved on second reading to 147/98. Pulses are regular 90 was afebrile, room air saturations were 93%. Unfortunately he had increased work of breathing and had very limited air movement  and it has been given a total of 2 continuous nebulizers as well as IV Solu-Medrol without improvement in his symptoms. Because of this chest pain he was given one chewable aspirin. Upon and was negative as was EKG. EDP spoke with patient's primary cardiologist Dr. Terrence Dupont who reviewed the chart and recommended that regarding any chest pain or cardiac symptoms that he was stable enough to discharge home and follow-up with him in the office. Because of continued issues with poor airway movement patient will be admitted for COPD exacerbation.    Hospital Course:     1. Acute hypoxic respiratory failure due to COPD exacerbation. Much improved, off oxygen, will transition to oral prednisone, continue azithromycin for 3 more days, continue nebulizer treatments as needed. His child with outpatient pulmonary follow-up.   2. History of smoking. Counseled to quit.   3. CAD with chronic systolic heart failure asked EF 30%. No edema on exam, repeat echo stable, currently appears compensated, continue combination of aspirin, statin, blood pressure too low for beta blocker or ACE/ARB. Follow with Dr. Terrence Dupont his cardiologist within 1-2 weeks   4. Hyperthyroidism. Discussed with endocrinologist Dr. Loanne Drilling placed on methimazole, follow with endocrine within the next 2-3 days post discharge.   5. History of adrenal mass found on CT scan August 2016. Outpatient follow-up with both endocrine and PCP post discharge for this. This was found outpatient months before this admission.   6. Dyslipidemia. Continue home dose Synthroid.   7. Anemia of chronic disease. Stable no acute issues outpatient workup.    8. Mild hyponatremia - stable, likely mild dehydration, request PCP repeat BMP in a week.     Discharge Condition: Stable  Follow UP  Follow-up Information    Follow up with [name], MD. Schedule an appointment as soon as possible for a visit in 3 days.   Specialty:  Family Medicine    Contact information:   [address] [place] [address] [phone]       Follow up with Charolette Forward, MD. Schedule an appointment as soon as possible for a visit in 1 week.   Specialty:  Cardiology   Contact information:   [address] [place] [address] [phone]       Follow up with Renato Shin, MD. Schedule an appointment as soon as possible for a visit in 2 days.   Specialty:  Endocrinology   Contact information:   [address]  [place] [address] [phone]        Consults obtained -  Endocrine over the phone  Diet and Activity recommendation: See Discharge Instructions below  Discharge Instructions       Discharge Instructions    Diet - low sodium heart healthy  Complete by:  As directed      Discharge instructions    Complete by:  As directed   Follow with Primary MD [name], MD in 7 days   Get CBC, CMP, 2 view Chest X ray checked  by Primary MD next visit.    Activity: As tolerated with Full fall precautions use walker/cane & assistance as needed   Disposition Home     Diet: Heart Healthy    For Heart failure patients - Check your Weight same time everyday, if you gain over 2 pounds, or you develop in leg swelling, experience more shortness of breath or chest pain, call your Primary MD immediately. Follow Cardiac Low Salt Diet and 1.5 lit/day fluid restriction.   On your next visit with your primary care physician please Get Medicines reviewed and adjusted.   Please request your Prim.MD to go over all Hospital Tests and Procedure/Radiological results at the follow up, please get all Hospital records sent to your Prim MD by signing hospital release before you go home.   If you experience worsening of your admission symptoms, develop shortness of breath, life threatening emergency, suicidal or homicidal thoughts you must seek medical attention immediately by calling 911 or calling your MD  immediately  if symptoms less severe.  You Must read complete instructions/literature along with all the possible adverse reactions/side effects for all the Medicines you take and that have been prescribed to you. Take any new Medicines after you have completely understood and accpet all the possible adverse reactions/side effects.   Do not drive, operating heavy machinery, perform activities at heights, swimming or participation in water activities or provide baby sitting services if your were admitted for syncope or siezures until you have seen by Primary MD or a Neurologist and advised to do so again.  Do not drive when taking Pain medications.    Do not take more than prescribed Pain, Sleep and Anxiety Medications  Special Instructions: If you have smoked or chewed Tobacco  in the last 2 yrs please stop smoking, stop any regular Alcohol  and or any Recreational drug use.  Wear Seat belts while driving.   Please note  You were cared for by a hospitalist during your hospital stay. If you have any questions about your discharge medications or the care you received while you were in the hospital after you are discharged, you can call the unit and asked to speak with the hospitalist on call if the hospitalist that took care of you is not available. Once you are discharged, your primary care physician will handle any further medical issues. Please note that NO REFILLS for any discharge medications will be authorized once you are discharged, as it is imperative that you return to your primary care physician (or establish a relationship with a primary care physician if you do not have one) for your aftercare needs so that they can reassess your need for medications and monitor your lab values.     Increase activity slowly    Complete by:  As directed              Discharge Medications       Medication List    STOP taking these medications        ipratropium-albuterol 0.5-2.5 (3) MG/3ML  Soln  Commonly known as:  DUONEB      TAKE these medications        aspirin 81 MG EC tablet  Take 1 tablet (81 mg total)  by mouth daily.     atorvastatin 40 MG tablet  Commonly known as:  LIPITOR  Take 1 tablet (40 mg total) by mouth at bedtime.     azithromycin 250 MG tablet  Commonly known as:  ZITHROMAX  Take 1 tablet (250 mg total) by mouth daily.     budesonide-formoterol 160-4.5 MCG/ACT inhaler  Commonly known as:  SYMBICORT  Inhale 2 puffs into the lungs 2 (two) times daily.     [employer] SLEEP AID 5 MG Tabs  Generic drug:  Melatonin  TAKE10 MG BY MOUTH 30 MIN BEFORE BED TIME.     ibuprofen 200 MG tablet  Commonly known as:  ADVIL,MOTRIN  Take 200 mg by mouth every 6 (six) hours as needed for mild pain.     methimazole 10 MG tablet  Commonly known as:  TAPAZOLE  Take 2 tablets (20 mg total) by mouth 2 (two) times daily.     multivitamin with minerals Tabs tablet  Take 1 tablet by mouth daily.     polyethylene glycol packet  Commonly known as:  MIRALAX / GLYCOLAX  Take 17 g by mouth daily as needed for mild constipation.     predniSONE 5 MG tablet  Commonly known as:  DELTASONE  Label  & dispense according to the schedule below. 10 Pills PO for 3 days then, 8 Pills PO for 3 days, 6 Pills PO for 3 days, 4 Pills PO for 3 days, 2 Pills PO for 3 days, 1 Pills PO for 3 days, 1/2 Pill  PO for 3 days then STOP. Total 95 pills.     PROAIR HFA 108 (90 BASE) MCG/ACT inhaler  Generic drug:  albuterol  INHALE 2 PUFFS EVERY 4 HOURS AS NEEDED FOR WHEEZING OR SHORTNESS OF BREATH     albuterol (2.5 MG/3ML) 0.083% nebulizer solution  Commonly known as:  PROVENTIL  Take 3 mLs (2.5 mg total) by nebulization every 2 (two) hours as needed for wheezing or shortness of breath.     SPIRIVA HANDIHALER 18 MCG inhalation capsule  Generic drug:  tiotropium  Place 1 capsule (18 mcg total) into inhaler and inhale daily.     traZODone 50 MG tablet  Commonly known as:  DESYREL  Take 50 mg  by mouth at bedtime.        Major procedures and Radiology Reports - PLEASE review detailed and final reports for all details, in brief    TTE  - Left ventricle: The cavity size was normal. Systolic function was normal. The estimated ejection fraction was in the range of 50% to 55%. - Atrial septum: No defect or patent foramen ovale was identified. - Pericardium, extracardiac: Features were not consistent with tamponade physiology.   Dg Chest 2 View  08/11/2015  CLINICAL DATA:  Left-sided chest pain and shortness of breath beginning last night. Initial encounter. EXAM: CHEST  2 VIEW COMPARISON:  PA and lateral chest 05/24/2015 and 03/25/2015. CT chest 03/25/2015. FINDINGS: The lungs are emphysematous but clear. Nipple shadows are again seen. Heart size is normal. No pneumothorax or pleural effusion. No focal bony abnormality. IMPRESSION: Emphysema without acute disease. Electronically Signed   By: Inge Rise M.D.   On: 08/11/2015 13:12    Micro Results      No results found for this or any previous visit (from the past 240 hour(s)).     Today   Subjective    Anthony Bolton today has no headache,no chest abdominal pain,no new weakness tingling or numbness,  feels much better wants to go home today.     Objective   Blood pressure 104/78, pulse 96, temperature 98.8 F (37.1 C), temperature source Oral, resp. rate 18, height 5\' 9"  (1.753 m), weight 50.576 kg (111 lb 8 oz), SpO2 99 %.   Intake/Output Summary (Last 24 hours) at 08/13/15 1029 Last data filed at 08/13/15 0800  Gross per 24 hour  Intake   1420 ml  Output      0 ml  Net   1420 ml    Exam Awake Alert, Oriented x 3, No new F.N deficits, Normal affect [place].AT,PERRAL Supple Neck,No JVD, No cervical lymphadenopathy appriciated.  Symmetrical Chest wall movement, Good air movement bilaterally, CTAB RRR,No Gallops,Rubs or new Murmurs, No Parasternal Heave +ve B.Sounds, Abd Soft, Non tender, No  organomegaly appriciated, No rebound -guarding or rigidity. No Cyanosis, Clubbing or edema, No new Rash or bruise   Data Review   CBC w Diff: Lab Results  Component Value Date   WBC 5.3 08/12/2015   HGB 11.3* 08/12/2015   HCT 34.9* 08/12/2015   PLT 156 08/12/2015   LYMPHOPCT 18 08/11/2015   MONOPCT 7 08/11/2015   EOSPCT 3 08/11/2015   BASOPCT 0 08/11/2015    CMP: Lab Results  Component Value Date   NA 131* 08/12/2015   K 4.0 08/12/2015   CL 98* 08/12/2015   CO2 26 08/12/2015   BUN 15 08/12/2015   CREATININE 0.70 08/12/2015   PROT 5.4* 08/12/2015   ALBUMIN 3.2* 08/12/2015   BILITOT 0.4 08/12/2015   ALKPHOS 46 08/12/2015   AST 22 08/12/2015   ALT 12* 08/12/2015  .   Total Time in preparing paper work, data evaluation and todays exam - 35 minutes  Thurnell Lose M.D on 08/13/2015 at 10:29 AM  [facility]   Office  [phone]

## 2015-08-25 ENCOUNTER — Emergency Department (HOSPITAL_COMMUNITY)
Admission: EM | Admit: 2015-08-25 | Discharge: 2015-08-26 | Disposition: A | Discharge status: Home / Self Care | Payer: Medicare Other | Attending: Emergency Medicine | Admitting: Emergency Medicine

## 2015-08-25 ENCOUNTER — Emergency Department (HOSPITAL_COMMUNITY): Payer: Medicare Other

## 2015-08-25 ENCOUNTER — Encounter (HOSPITAL_COMMUNITY): Payer: Self-pay | Admitting: Emergency Medicine

## 2015-08-25 DIAGNOSIS — Z792 Long term (current) use of antibiotics: Secondary | ICD-10-CM | POA: Diagnosis not present

## 2015-08-25 DIAGNOSIS — E871 Hypo-osmolality and hyponatremia: Secondary | ICD-10-CM | POA: Diagnosis not present

## 2015-08-25 DIAGNOSIS — Z7982 Long term (current) use of aspirin: Secondary | ICD-10-CM | POA: Insufficient documentation

## 2015-08-25 DIAGNOSIS — Z7951 Long term (current) use of inhaled steroids: Secondary | ICD-10-CM | POA: Insufficient documentation

## 2015-08-25 DIAGNOSIS — R05 Cough: Secondary | ICD-10-CM | POA: Diagnosis not present

## 2015-08-25 DIAGNOSIS — R0602 Shortness of breath: Secondary | ICD-10-CM

## 2015-08-25 DIAGNOSIS — E785 Hyperlipidemia, unspecified: Secondary | ICD-10-CM | POA: Diagnosis not present

## 2015-08-25 DIAGNOSIS — F17210 Nicotine dependence, cigarettes, uncomplicated: Secondary | ICD-10-CM | POA: Insufficient documentation

## 2015-08-25 DIAGNOSIS — J441 Chronic obstructive pulmonary disease with (acute) exacerbation: Secondary | ICD-10-CM | POA: Insufficient documentation

## 2015-08-25 DIAGNOSIS — J449 Chronic obstructive pulmonary disease, unspecified: Secondary | ICD-10-CM | POA: Diagnosis not present

## 2015-08-25 DIAGNOSIS — I509 Heart failure, unspecified: Secondary | ICD-10-CM | POA: Insufficient documentation

## 2015-08-25 DIAGNOSIS — R069 Unspecified abnormalities of breathing: Secondary | ICD-10-CM | POA: Diagnosis not present

## 2015-08-25 DIAGNOSIS — Z9889 Other specified postprocedural states: Secondary | ICD-10-CM | POA: Insufficient documentation

## 2015-08-25 DIAGNOSIS — Z79899 Other long term (current) drug therapy: Secondary | ICD-10-CM | POA: Diagnosis not present

## 2015-08-25 DIAGNOSIS — H9190 Unspecified hearing loss, unspecified ear: Secondary | ICD-10-CM | POA: Insufficient documentation

## 2015-08-25 LAB — CBC WITH DIFFERENTIAL/PLATELET
BASOS ABS: 0 10*3/uL (ref 0.0–0.1)
BASOS PCT: 1 %
EOS ABS: 0.1 10*3/uL (ref 0.0–0.7)
EOS PCT: 2 %
HCT: 40.2 % (ref 39.0–52.0)
Hemoglobin: 13.6 g/dL (ref 13.0–17.0)
Lymphocytes Relative: 15 %
Lymphs Abs: 1.1 10*3/uL (ref 0.7–4.0)
MCH: 30 pg (ref 26.0–34.0)
MCHC: 33.8 g/dL (ref 30.0–36.0)
MCV: 88.5 fL (ref 78.0–100.0)
MONO ABS: 0.5 10*3/uL (ref 0.1–1.0)
Monocytes Relative: 8 %
Neutro Abs: 5.2 10*3/uL (ref 1.7–7.7)
Neutrophils Relative %: 74 %
PLATELETS: 191 10*3/uL (ref 150–400)
RBC: 4.54 MIL/uL (ref 4.22–5.81)
RDW: 13.9 % (ref 11.5–15.5)
WBC: 7 10*3/uL (ref 4.0–10.5)

## 2015-08-25 LAB — BASIC METABOLIC PANEL
ANION GAP: 11 (ref 5–15)
BUN: 10 mg/dL (ref 6–20)
CALCIUM: 9 mg/dL (ref 8.9–10.3)
CO2: 25 mmol/L (ref 22–32)
Chloride: 91 mmol/L — ABNORMAL LOW (ref 101–111)
Creatinine, Ser: 0.61 mg/dL (ref 0.61–1.24)
GLUCOSE: 105 mg/dL — AB (ref 65–99)
Potassium: 4.4 mmol/L (ref 3.5–5.1)
Sodium: 127 mmol/L — ABNORMAL LOW (ref 135–145)

## 2015-08-25 LAB — I-STAT TROPONIN, ED: Troponin i, poc: 0.01 ng/mL (ref 0.00–0.08)

## 2015-08-25 MED ORDER — METHYLPREDNISOLONE SODIUM SUCC 125 MG IJ SOLR
125.0000 mg | Freq: Once | INTRAMUSCULAR | Status: AC
  Administered 2015-08-25: 125 mg via INTRAVENOUS
  Filled 2015-08-25: qty 2

## 2015-08-25 MED ORDER — IPRATROPIUM BROMIDE 0.02 % IN SOLN
0.5000 mg | Freq: Once | RESPIRATORY_TRACT | Status: AC
  Administered 2015-08-25: 0.5 mg via RESPIRATORY_TRACT
  Filled 2015-08-25: qty 2.5

## 2015-08-25 MED ORDER — ALBUTEROL SULFATE (2.5 MG/3ML) 0.083% IN NEBU
5.0000 mg | INHALATION_SOLUTION | Freq: Once | RESPIRATORY_TRACT | Status: AC
  Administered 2015-08-25: 5 mg via RESPIRATORY_TRACT
  Filled 2015-08-25: qty 6

## 2015-08-25 MED ORDER — PREDNISONE 20 MG PO TABS: 40.0000 mg | ORAL_TABLET | Freq: Every day | ORAL | Status: DC

## 2015-08-25 MED ORDER — ALBUTEROL SULFATE (2.5 MG/3ML) 0.083% IN NEBU: 5.0000 mg | INHALATION_SOLUTION | Freq: Four times a day (QID) | RESPIRATORY_TRACT | Status: DC | PRN

## 2015-08-25 NOTE — ED Provider Notes (Signed)
CSN: [id number]     Arrival date & time 08/25/15  1758 History   First MD Initiated Contact with Patient 08/25/15 1821     Chief Complaint  Patient presents with  . Shortness of Breath     (Consider location/radiation/quality/duration/timing/severity/associated sxs/prior Treatment) The history is provided by the patient and medical records.    64 year old male with history of COPD, congestive heart failure, hyperlipidemia, presenting to the ED for shortness of breath. Patient has long-standing history of uncontrolled COPD. He is seen in the emergency department frequently with multiple admissions. Patient states he ran out of medication for his nebulizer machine 2 days ago. He has been using his albuterol inhaler without relief. He states he has had a dry cough without fever or chills. He does have some generalized chest tightness without radiation. Patient states he is also very hungry. He states he has not eaten in 2 days as there was no food in his home and he had no money to go buy anything.  He states he did find a banana and an orange earlier today that he ate.  Past Medical History  Diagnosis Date  . COPD (chronic obstructive pulmonary disease) ([place])   . HOH (hard of hearing)   . CHF (congestive heart failure) (HCC) EF 20 - 25% in 2015  . HLD (hyperlipidemia)    Past Surgical History  Procedure Laterality Date  . Appendectomy    . Left heart catheterization with coronary angiogram N/A 11/07/2013    Procedure: LEFT HEART CATHETERIZATION WITH CORONARY ANGIOGRAM;  Surgeon: Clent Demark, MD;  Location: [facility] CATH LAB;  Service: Cardiovascular;  Laterality: N/A;  . Cardiac stents      No stent history   Family History  Problem Relation Age of Onset  . Stroke Mother    Social History  Substance Use Topics  . Smoking status: Current Every Day Smoker    Types: Cigarettes  . Smokeless tobacco: Never Used  . Alcohol Use: No    Review of Systems  Respiratory: Positive for cough,  shortness of breath and wheezing.   All other systems reviewed and are negative.     Allergies  Review of patient's allergies indicates no known allergies.  Home Medications   Prior to Admission medications   Medication Sig Start Date End Date Taking? Authorizing Provider  albuterol (PROVENTIL) (2.5 MG/3ML) 0.083% nebulizer solution Take 3 mLs (2.5 mg total) by nebulization every 2 (two) hours as needed for wheezing or shortness of breath. 08/13/15   Thurnell Lose, MD  aspirin EC 81 MG EC tablet Take 1 tablet (81 mg total) by mouth daily. 03/04/15   Maryann Mikhail, DO  atorvastatin (LIPITOR) 40 MG tablet Take 1 tablet (40 mg total) by mouth at bedtime. 03/04/15   Maryann Mikhail, DO  azithromycin (ZITHROMAX) 250 MG tablet Take 1 tablet (250 mg total) by mouth daily. 08/13/15   Thurnell Lose, MD  budesonide-formoterol (SYMBICORT) 160-4.5 MCG/ACT inhaler Inhale 2 puffs into the lungs 2 (two) times daily. 08/13/15   Thurnell Lose, MD  [employer] SLEEP AID 5 MG TABS TAKE10 MG BY MOUTH 30 MIN BEFORE BED TIME. 04/18/15   Historical Provider, MD  ibuprofen (ADVIL,MOTRIN) 200 MG tablet Take 200 mg by mouth every 6 (six) hours as needed for mild pain.    Historical Provider, MD  methimazole (TAPAZOLE) 10 MG tablet Take 2 tablets (20 mg total) by mouth 2 (two) times daily. 08/13/15   Thurnell Lose, MD  Multiple Vitamin (  MULTIVITAMIN WITH MINERALS) TABS tablet Take 1 tablet by mouth daily. 03/04/15   Maryann Mikhail, DO  polyethylene glycol (MIRALAX / GLYCOLAX) packet Take 17 g by mouth daily as needed for mild constipation. 08/13/15   Thurnell Lose, MD  predniSONE (DELTASONE) 5 MG tablet Label  & dispense according to the schedule below. 10 Pills PO for 3 days then, 8 Pills PO for 3 days, 6 Pills PO for 3 days, 4 Pills PO for 3 days, 2 Pills PO for 3 days, 1 Pills PO for 3 days, 1/2 Pill  PO for 3 days then STOP. Total 95 pills. 08/13/15   Thurnell Lose, MD  PROAIR HFA 108 (90 BASE) MCG/ACT  inhaler INHALE 2 PUFFS EVERY 4 HOURS AS NEEDED FOR WHEEZING OR SHORTNESS OF BREATH 04/18/15   Historical Provider, MD  SPIRIVA HANDIHALER 18 MCG inhalation capsule Place 1 capsule (18 mcg total) into inhaler and inhale daily. 08/13/15   Thurnell Lose, MD  traZODone (DESYREL) 50 MG tablet Take 50 mg by mouth at bedtime.    Historical Provider, MD   BP 128/100 mmHg  Pulse 78  Temp(Src) 97.8 F (36.6 C) (Oral)  Resp 24  SpO2 93%   Physical Exam  Constitutional: He is oriented to person, place, and time. He appears well-developed and well-nourished. No distress.  Extremely hard of hearing  HENT:  Head: Normocephalic and atraumatic.  Mouth/Throat: Oropharynx is clear and moist.  Eyes: Conjunctivae and EOM are normal. Pupils are equal, round, and reactive to light.  Neck: Normal range of motion.  Cardiovascular: Normal rate, regular rhythm and normal heart sounds.   Pulmonary/Chest: Effort normal. He has wheezes.  Diffuse expiratory wheezes throughout, no retractions or accessory muscle use, speaking in full sentences without difficulty  Abdominal: Soft. Bowel sounds are normal.  Musculoskeletal: Normal range of motion.  Neurological: He is alert and oriented to person, place, and time.  Skin: Skin is warm and dry. He is not diaphoretic.  Psychiatric: He has a normal mood and affect.  Nursing note and vitals reviewed.   ED Course  Procedures (including critical care time) Labs Review Labs Reviewed  BASIC METABOLIC PANEL - Abnormal; Notable for the following:    Sodium 127 (*)    Chloride 91 (*)    Glucose, Bld 105 (*)    All other components within normal limits  CBC WITH DIFFERENTIAL/PLATELET  Randolm Idol, ED    Imaging Review Dg Chest 2 View  08/25/2015  CLINICAL DATA:  Shortness of breath. Chest tightness. COPD. Cough. Cold symptoms. EXAM: CHEST - 2 VIEW COMPARISON:  Two-view chest 08/11/2015. FINDINGS: The heart size is normal. Emphysema is again noted. The lungs  are clear. There is no focal airspace consolidation. There is no edema or effusion to suggest failure. The visualized soft tissues and bony thorax are unremarkable. IMPRESSION: 1. Emphysema. 2. No acute cardiopulmonary disease or significant interval change. Electronically Signed   By: San Morelle M.D.   On: 08/25/2015 19:20   I have personally reviewed and evaluated these images and lab results as part of my medical decision-making.   EKG Interpretation None      MDM   Final diagnoses:  Shortness of breath  Chronic obstructive pulmonary disease, unspecified COPD type (HCC)  Hyponatremia   64 year old male here with shortness of breath. History of COPD, ran out of his albuterol nebulizer solution 2 days ago. Patient is afebrile, nontoxic. He does have expiratory wheezes throughout.  No signs of respiratory distress,  vital stable.  Workup as above, slight hyponatremia noted which is likely due to patient's limited oral intake over the past 2 days. He is tolerating oral food and fluids here without difficulty. He was treated with Solu-Medrol and nebulizer treatment 2 with significant improvement of his symptoms. Patient was able to ambulate and maintain O2 sats of 96% on room air.   Symptoms likely due to mild COPD exacerbation, lower suspicion for ACS, PE, dissection, or other acute cardiac event at this time. Patient appears stable for discharge. Rx prednisone taper, refill neb solution. Instructed to use nebs Q4-6H PRN wheezing, albuterol inhaler for breakthrough.  Patient also has home oxygen for PRN use.  Patient is to follow-up with his PCP later this week, have recommended that he have his sodium rechecked as well.  Patient was also given sandwiches and crackers to take home.  Discussed plan with patient, he/she acknowledged understanding and agreed with plan of care.  Return precautions given for new or worsening symptoms.  Larene Pickett, PA-C 08/25/15 [phone]

## 2015-08-25 NOTE — ED Provider Notes (Signed)
Medical screening examination/treatment/procedure(s) were conducted as a shared visit with non-physician practitioner(s) and myself.  I personally evaluated the patient during the encounter.   EKG Interpretation   Date/Time:  Sunday August 25 2015 18:21:24 EST Ventricular Rate:  76 PR Interval:  149 QRS Duration: 94 QT Interval:  400 QTC Calculation: 450 R Axis:   36 Text Interpretation:  Sinus rhythm Atrial premature complex Baseline  wander in lead(s) V1 V2 No significant change since last tracing Confirmed  by Harvis Mabus  MD, Edder Bellanca ([id number]) on 08/25/2015 7:03:47 PM     Patient here with increasing shortness of breath and history of COPD. Given breathing treatments here. And feels better Patient able to ambulate on room air with pulse ox of 95%. His air exchange is improved and he is stable for discharge and he does have home oxygen which he can use when necessary  Lacretia Leigh, MD 08/25/15 2253

## 2015-08-25 NOTE — ED Notes (Signed)
Pt to x-ray and returned to room without distress noted. 

## 2015-08-25 NOTE — Discharge Instructions (Signed)
Take the prescribed medication as directed.  Use albuterol nebs every 4-6 hours as needed for shortness of breath/wheezing. Follow-up with your primary care physician-- have them recheck your sodium next week as it was mildly low today. Return to the ED for new or worsening symptoms.

## 2015-08-25 NOTE — ED Notes (Signed)
Pt arrived via EMS with report of SHOB since last night and given Albuterol 5mg  neb tx en-route. Pt reported chest tightness, wheezing and nonproductive dry cough. Pt stated that he feels better since neb tx via EMS. Pt reported not access to food x2days.

## 2015-08-26 NOTE — ED Notes (Signed)
Pt toke own IV out.

## 2015-08-26 NOTE — ED Notes (Signed)
Patient angry when discharged that he was not being provided with transportation home. Patient was advised he may wait in the lobby until morning and be given a bus pass at that time. Patient was not satisfied with that offer. Charge nurse to bedside to speak with patient, patient was again told the same thing. Security to bedside as patient refused to leave the room. Patient eventually walked out with security and off duty police. Security was informed patient may wait in the lobby as long as he does so respectfully.

## 2015-09-08 ENCOUNTER — Encounter (HOSPITAL_COMMUNITY): Payer: Self-pay | Admitting: Emergency Medicine

## 2015-09-08 ENCOUNTER — Emergency Department (HOSPITAL_COMMUNITY): Payer: Medicare Other

## 2015-09-08 ENCOUNTER — Emergency Department (HOSPITAL_COMMUNITY)
Admission: EM | Admit: 2015-09-08 | Discharge: 2015-09-08 | Disposition: A | Discharge status: Home / Self Care | Payer: Medicare Other | Attending: Emergency Medicine | Admitting: Emergency Medicine

## 2015-09-08 DIAGNOSIS — R05 Cough: Secondary | ICD-10-CM | POA: Diagnosis not present

## 2015-09-08 DIAGNOSIS — H9190 Unspecified hearing loss, unspecified ear: Secondary | ICD-10-CM | POA: Insufficient documentation

## 2015-09-08 DIAGNOSIS — R0602 Shortness of breath: Secondary | ICD-10-CM | POA: Diagnosis not present

## 2015-09-08 DIAGNOSIS — Z7951 Long term (current) use of inhaled steroids: Secondary | ICD-10-CM | POA: Insufficient documentation

## 2015-09-08 DIAGNOSIS — R069 Unspecified abnormalities of breathing: Secondary | ICD-10-CM | POA: Diagnosis not present

## 2015-09-08 DIAGNOSIS — F17210 Nicotine dependence, cigarettes, uncomplicated: Secondary | ICD-10-CM | POA: Insufficient documentation

## 2015-09-08 DIAGNOSIS — E785 Hyperlipidemia, unspecified: Secondary | ICD-10-CM | POA: Insufficient documentation

## 2015-09-08 DIAGNOSIS — I509 Heart failure, unspecified: Secondary | ICD-10-CM | POA: Diagnosis not present

## 2015-09-08 DIAGNOSIS — Z792 Long term (current) use of antibiotics: Secondary | ICD-10-CM | POA: Insufficient documentation

## 2015-09-08 DIAGNOSIS — Z9889 Other specified postprocedural states: Secondary | ICD-10-CM | POA: Insufficient documentation

## 2015-09-08 DIAGNOSIS — J441 Chronic obstructive pulmonary disease with (acute) exacerbation: Secondary | ICD-10-CM | POA: Diagnosis not present

## 2015-09-08 DIAGNOSIS — Z7982 Long term (current) use of aspirin: Secondary | ICD-10-CM | POA: Insufficient documentation

## 2015-09-08 DIAGNOSIS — Z79899 Other long term (current) drug therapy: Secondary | ICD-10-CM | POA: Diagnosis not present

## 2015-09-08 LAB — CBC WITH DIFFERENTIAL/PLATELET
BASOS ABS: 0 10*3/uL (ref 0.0–0.1)
Basophils Relative: 0 %
EOS PCT: 1 %
Eosinophils Absolute: 0.1 10*3/uL (ref 0.0–0.7)
HCT: 41.7 % (ref 39.0–52.0)
Hemoglobin: 13.8 g/dL (ref 13.0–17.0)
LYMPHS PCT: 12 %
Lymphs Abs: 0.9 10*3/uL (ref 0.7–4.0)
MCH: 30.3 pg (ref 26.0–34.0)
MCHC: 33.1 g/dL (ref 30.0–36.0)
MCV: 91.6 fL (ref 78.0–100.0)
MONO ABS: 0.2 10*3/uL (ref 0.1–1.0)
Monocytes Relative: 2 %
Neutro Abs: 6.3 10*3/uL (ref 1.7–7.7)
Neutrophils Relative %: 85 %
PLATELETS: 172 10*3/uL (ref 150–400)
RBC: 4.55 MIL/uL (ref 4.22–5.81)
RDW: 15 % (ref 11.5–15.5)
WBC: 7.5 10*3/uL (ref 4.0–10.5)

## 2015-09-08 LAB — BLOOD GAS, ARTERIAL
ACID-BASE DEFICIT: 1.1 mmol/L (ref 0.0–2.0)
BICARBONATE: 24.2 meq/L — AB (ref 20.0–24.0)
DRAWN BY: 232811
O2 CONTENT: 10 L/min
O2 Saturation: 99.5 %
PATIENT TEMPERATURE: 97.6
PH ART: 7.359 (ref 7.350–7.450)
TCO2: 21.9 mmol/L (ref 0–100)
pCO2 arterial: 43.7 mmHg (ref 35.0–45.0)
pO2, Arterial: 275 mmHg — ABNORMAL HIGH (ref 80.0–100.0)

## 2015-09-08 LAB — BASIC METABOLIC PANEL WITH GFR
Anion gap: 9 (ref 5–15)
BUN: 14 mg/dL (ref 6–20)
CO2: 24 mmol/L (ref 22–32)
Calcium: 9.1 mg/dL (ref 8.9–10.3)
Chloride: 98 mmol/L — ABNORMAL LOW (ref 101–111)
Creatinine, Ser: 0.62 mg/dL (ref 0.61–1.24)
GFR calc Af Amer: >60 mL/min
GFR calc non Af Amer: >60 mL/min
Glucose, Bld: 127 mg/dL — ABNORMAL HIGH (ref 65–99)
Potassium: 4 mmol/L (ref 3.5–5.1)
Sodium: 131 mmol/L — ABNORMAL LOW (ref 135–145)

## 2015-09-08 LAB — TROPONIN I: Troponin I: <0.03 ng/mL

## 2015-09-08 LAB — BRAIN NATRIURETIC PEPTIDE: B Natriuretic Peptide: 61.1 pg/mL (ref 0.0–100.0)

## 2015-09-08 MED ORDER — AMOXICILLIN 500 MG PO CAPS: 500.0000 mg | ORAL_CAPSULE | Freq: Three times a day (TID) | ORAL | Status: DC

## 2015-09-08 MED ORDER — ALBUTEROL SULFATE (2.5 MG/3ML) 0.083% IN NEBU: 2.5000 mg | INHALATION_SOLUTION | RESPIRATORY_TRACT | Status: DC | PRN

## 2015-09-08 MED ORDER — PREDNISONE 20 MG PO TABS: 60.0000 mg | ORAL_TABLET | Freq: Every day | ORAL | Status: DC

## 2015-09-08 MED ORDER — ALBUTEROL SULFATE HFA 108 (90 BASE) MCG/ACT IN AERS
2.0000 | INHALATION_SPRAY | RESPIRATORY_TRACT | Status: DC | PRN
  Administered 2015-09-08: 2 via RESPIRATORY_TRACT
  Filled 2015-09-08: qty 6.7

## 2015-09-08 NOTE — ED Notes (Addendum)
Per EMS, patient with non productive cough and resp distress started this morning. Patient was given 15mg  albuterol neb, 1mg  atrovent neb, and 125 mg Solumedrol IV. Patient also c/o left chest pain, non radiating. Patient is HOH.   Betsey Holiday, MD at bedside

## 2015-09-08 NOTE — ED Notes (Signed)
RT at bedside for ABG. Patient now yelling at RT "I'm gonna tell her to go to hell".

## 2015-09-08 NOTE — Discharge Instructions (Signed)

## 2015-09-08 NOTE — ED Notes (Signed)
Lab tech at bedside to collect blood

## 2015-09-08 NOTE — ED Provider Notes (Signed)
CSN: [id number]     Arrival date & time 09/08/15  0404 History   First MD Initiated Contact with Patient 09/08/15 0411     Chief Complaint  Patient presents with  . Respiratory Distress     (Consider location/radiation/quality/duration/timing/severity/associated sxs/prior Treatment) HPI Comments: Patient presents to the emergency room for evaluation of shortness of breath. Patient reports that he has been experiencing a cough and difficulty breathing. He has a history of COPD. He also has a history of heart disease. Patient reports being out of his medications for approximately 2 weeks. He became acutely short of breath tonight and called EMS. He is brought to ER by ambulance. He has been given Solu-Medrol 125 mg, albuterol 15 mg, Ativan 1 mg during transport. He does report that he is feeling improvement with his breathing.   Past Medical History  Diagnosis Date  . COPD (chronic obstructive pulmonary disease) ([place])   . HOH (hard of hearing)   . CHF (congestive heart failure) (H[facility]) EF 20 - 25% in 2015  . HLD (hyperlipidemia)    Past Surgical History  Procedure Laterality Date  . Appendectomy    . Left heart catheterization with coronary angiogram N/A 11/07/2013    Procedure: LEFT HEART CATHETERIZATION WITH CORONARY ANGIOGRAM;  Surgeon: Clent Demark, MD;  Location: [facility] CATH LAB;  Service: Cardiovascular;  Laterality: N/A;  . Cardiac stents      No stent history   Family History  Problem Relation Age of Onset  . Stroke Mother    Social History  Substance Use Topics  . Smoking status: Current Every Day Smoker    Types: Cigarettes  . Smokeless tobacco: Never Used  . Alcohol Use: No    Review of Systems  Respiratory: Positive for cough and shortness of breath.   All other systems reviewed and are negative.     Allergies  Review of patient's allergies indicates no known allergies.  Home Medications   Prior to Admission medications   Medication Sig Start Date End Date  Taking? Authorizing Provider  acetaminophen (TYLENOL) 325 MG tablet Take 650 mg by mouth every 6 (six) hours as needed for moderate pain or headache.    Historical Provider, MD  albuterol (PROVENTIL) (2.5 MG/3ML) 0.083% nebulizer solution Take 6 mLs (5 mg total) by nebulization every 6 (six) hours as needed for wheezing or shortness of breath. 08/25/15   Larene Pickett, PA-C  aspirin EC 81 MG EC tablet Take 1 tablet (81 mg total) by mouth daily. 03/04/15   Maryann Mikhail, DO  atorvastatin (LIPITOR) 40 MG tablet Take 1 tablet (40 mg total) by mouth at bedtime. 03/04/15   Maryann Mikhail, DO  azithromycin (ZITHROMAX) 250 MG tablet Take 1 tablet (250 mg total) by mouth daily. 08/13/15   Thurnell Lose, MD  budesonide-formoterol (SYMBICORT) 160-4.5 MCG/ACT inhaler Inhale 2 puffs into the lungs 2 (two) times daily. 08/13/15   Thurnell Lose, MD  [employer] SLEEP AID 5 MG TABS TAKE10 MG BY MOUTH 30 MIN BEFORE BED TIME. 04/18/15   Historical Provider, MD  ipratropium-albuterol (DUONEB) 0.5-2.5 (3) MG/3ML SOLN Inhale 3 mLs into the lungs every 6 (six) hours as needed. 05/20/15   Historical Provider, MD  methimazole (TAPAZOLE) 10 MG tablet Take 2 tablets (20 mg total) by mouth 2 (two) times daily. 08/13/15   Thurnell Lose, MD  Multiple Vitamin (MULTIVITAMIN WITH MINERALS) TABS tablet Take 1 tablet by mouth daily. 03/04/15   Maryann Mikhail, DO  polyethylene glycol (MIRALAX /  GLYCOLAX) packet Take 17 g by mouth daily as needed for mild constipation. 08/13/15   Thurnell Lose, MD  predniSONE (DELTASONE) 20 MG tablet Take 2 tablets (40 mg total) by mouth daily. Take 40 mg by mouth daily for 3 days, then 20mg  by mouth daily for 3 days, then 10mg  daily for 3 days 08/25/15   Larene Pickett, PA-C  SPIRIVA HANDIHALER 18 MCG inhalation capsule Place 1 capsule (18 mcg total) into inhaler and inhale daily. 08/13/15   Thurnell Lose, MD  traZODone (DESYREL) 50 MG tablet Take 50 mg by mouth at bedtime.    Historical Provider,  MD   BP 150/102 mmHg  Pulse 86  Resp 16  SpO2 100% Physical Exam  Constitutional: He is oriented to person, place, and time. He appears well-developed and well-nourished. No distress.  HENT:  Head: Normocephalic and atraumatic.  Right Ear: Hearing normal.  Left Ear: Hearing normal.  Nose: Nose normal.  Mouth/Throat: Oropharynx is clear and moist and mucous membranes are normal.  Eyes: Conjunctivae and EOM are normal. Pupils are equal, round, and reactive to light.  Neck: Normal range of motion. Neck supple.  Cardiovascular: Regular rhythm, S1 normal and S2 normal.  Exam reveals no gallop and no friction rub.   No murmur heard. Pulmonary/Chest: Accessory muscle usage present. No respiratory distress. He has decreased breath sounds. He has wheezes. He exhibits no tenderness.  Abdominal: Soft. Normal appearance and bowel sounds are normal. There is no hepatosplenomegaly. There is no tenderness. There is no rebound, no guarding, no tenderness at McBurney's point and negative Murphy's sign. No hernia.  Musculoskeletal: Normal range of motion.  Neurological: He is alert and oriented to person, place, and time. He has normal strength. No cranial nerve deficit or sensory deficit. Coordination normal. GCS eye subscore is 4. GCS verbal subscore is 5. GCS motor subscore is 6.  Skin: Skin is warm, dry and intact. No rash noted. No cyanosis.  Psychiatric: He has a normal mood and affect. His speech is normal and behavior is normal. Thought content normal.  Nursing note and vitals reviewed.   ED Course  Procedures (including critical care time) Labs Review Labs Reviewed  CBC WITH DIFFERENTIAL/PLATELET  BASIC METABOLIC PANEL  TROPONIN I  BRAIN NATRIURETIC PEPTIDE  BLOOD GAS, ARTERIAL    Imaging Review No results found. I have personally reviewed and evaluated these images and lab results as part of my medical decision-making.   EKG Interpretation   Date/Time:  Sunday September 08 2015  04:10:26 EST Ventricular Rate:  86 PR Interval:  150 QRS Duration: 87 QT Interval:  359 QTC Calculation: 429 R Axis:   58 Text Interpretation:  Sinus rhythm Anteroseptal infarct, age indeterminate  Baseline wander in lead(s) I No significant change since last tracing  Confirmed by Rommie Dunn  MD, Sheridan Hew [phone]) on 09/08/2015 4:15:13 AM      MDM   Final diagnoses:  None   COPD  His only history of COPD presents to the ER for evaluation of difficulty breathing. Patient has been without his medications for as much as 2 weeks. He was aggressively treated by EMS and had improved by the time he arrived in the ER. He has been monitored, there is no hypoxia. Blood gas shows no evidence of hypoxia or CO2 retention. Cardiac evaluation is negative. Chest x-ray was clear. Patient is appropriate for discharge, continue outpatient management. Will be given an inhaler and prescribed for prednisone.    Orpah Greek, MD  09/08/15 0614 

## 2015-09-08 NOTE — ED Notes (Signed)
Patient reports running out of his nebulizer medications on Saturday. Patient states he does not have money to get them filled. Patient is verbally aggressive with this nurse as she asks questions for assessment. Patient was asked to please not yell at staff as we are trying to get him checked in and take care of him. XR came to get patient for imaging, they were requested to come back when the nebulizer treatment was completed. The XR tech asked @ how long it would be and patient started shouting at [facility] tech "now you are being rude". Patient was explained the reasoning for questions at which point he turned his head away from this nurse and refuses to answer any additional questions.

## 2015-09-08 NOTE — ED Notes (Signed)
Bed: [id number] Expected date:  Expected time:  Means of arrival:  Comments: 32M Resp 3 tx given

## 2015-10-02 ENCOUNTER — Emergency Department (HOSPITAL_COMMUNITY)
Admission: EM | Admit: 2015-10-02 | Discharge: 2015-10-02 | Disposition: A | Discharge status: Home / Self Care | Payer: Medicare Other | Attending: Emergency Medicine | Admitting: Emergency Medicine

## 2015-10-02 ENCOUNTER — Emergency Department (HOSPITAL_COMMUNITY): Payer: Medicare Other

## 2015-10-02 ENCOUNTER — Encounter (HOSPITAL_COMMUNITY): Payer: Self-pay | Admitting: Emergency Medicine

## 2015-10-02 DIAGNOSIS — R0602 Shortness of breath: Secondary | ICD-10-CM | POA: Diagnosis not present

## 2015-10-02 DIAGNOSIS — Z79899 Other long term (current) drug therapy: Secondary | ICD-10-CM | POA: Diagnosis not present

## 2015-10-02 DIAGNOSIS — Z9889 Other specified postprocedural states: Secondary | ICD-10-CM | POA: Insufficient documentation

## 2015-10-02 DIAGNOSIS — Z7951 Long term (current) use of inhaled steroids: Secondary | ICD-10-CM | POA: Diagnosis not present

## 2015-10-02 DIAGNOSIS — E785 Hyperlipidemia, unspecified: Secondary | ICD-10-CM | POA: Insufficient documentation

## 2015-10-02 DIAGNOSIS — J441 Chronic obstructive pulmonary disease with (acute) exacerbation: Secondary | ICD-10-CM | POA: Diagnosis not present

## 2015-10-02 DIAGNOSIS — I509 Heart failure, unspecified: Secondary | ICD-10-CM | POA: Diagnosis not present

## 2015-10-02 DIAGNOSIS — R0789 Other chest pain: Secondary | ICD-10-CM | POA: Diagnosis not present

## 2015-10-02 DIAGNOSIS — F17210 Nicotine dependence, cigarettes, uncomplicated: Secondary | ICD-10-CM | POA: Insufficient documentation

## 2015-10-02 DIAGNOSIS — Z7982 Long term (current) use of aspirin: Secondary | ICD-10-CM | POA: Insufficient documentation

## 2015-10-02 DIAGNOSIS — J449 Chronic obstructive pulmonary disease, unspecified: Secondary | ICD-10-CM | POA: Diagnosis not present

## 2015-10-02 DIAGNOSIS — H9190 Unspecified hearing loss, unspecified ear: Secondary | ICD-10-CM | POA: Insufficient documentation

## 2015-10-02 DIAGNOSIS — R079 Chest pain, unspecified: Secondary | ICD-10-CM | POA: Insufficient documentation

## 2015-10-02 DIAGNOSIS — R0682 Tachypnea, not elsewhere classified: Secondary | ICD-10-CM | POA: Diagnosis not present

## 2015-10-02 LAB — CBC
HCT: 43.9 % (ref 39.0–52.0)
HEMOGLOBIN: 15 g/dL (ref 13.0–17.0)
MCH: 30.2 pg (ref 26.0–34.0)
MCHC: 34.2 g/dL (ref 30.0–36.0)
MCV: 88.5 fL (ref 78.0–100.0)
Platelets: 179 10*3/uL (ref 150–400)
RBC: 4.96 MIL/uL (ref 4.22–5.81)
RDW: 14 % (ref 11.5–15.5)
WBC: 6.4 10*3/uL (ref 4.0–10.5)

## 2015-10-02 LAB — I-STAT TROPONIN, ED: Troponin i, poc: 0.01 ng/mL (ref 0.00–0.08)

## 2015-10-02 LAB — BASIC METABOLIC PANEL
Anion gap: 9 (ref 5–15)
BUN: 8 mg/dL (ref 6–20)
CALCIUM: 8.9 mg/dL (ref 8.9–10.3)
CO2: 26 mmol/L (ref 22–32)
CREATININE: 0.87 mg/dL (ref 0.61–1.24)
Chloride: 98 mmol/L — ABNORMAL LOW (ref 101–111)
GFR calc non Af Amer: >60 mL/min (ref >60)
GLUCOSE: 111 mg/dL — AB (ref 65–99)
Potassium: 4.2 mmol/L (ref 3.5–5.1)
Sodium: 133 mmol/L — ABNORMAL LOW (ref 135–145)

## 2015-10-02 MED ORDER — ALBUTEROL SULFATE (2.5 MG/3ML) 0.083% IN NEBU: 2.5000 mg | INHALATION_SOLUTION | Freq: Four times a day (QID) | RESPIRATORY_TRACT | Status: DC | PRN

## 2015-10-02 MED ORDER — IPRATROPIUM-ALBUTEROL 0.5-2.5 (3) MG/3ML IN SOLN: 3.0000 mL | Freq: Once | RESPIRATORY_TRACT | Status: DC

## 2015-10-02 MED ORDER — AZITHROMYCIN 250 MG PO TABS: 250.0000 mg | ORAL_TABLET | Freq: Every day | ORAL | Status: DC

## 2015-10-02 MED ORDER — PREDNISONE 20 MG PO TABS: 40.0000 mg | ORAL_TABLET | Freq: Every day | ORAL | Status: DC

## 2015-10-02 MED ORDER — ALBUTEROL SULFATE (2.5 MG/3ML) 0.083% IN NEBU
2.5000 mg | INHALATION_SOLUTION | Freq: Once | RESPIRATORY_TRACT | Status: AC
  Administered 2015-10-02: 2.5 mg via RESPIRATORY_TRACT
  Filled 2015-10-02: qty 3

## 2015-10-02 MED ORDER — AZITHROMYCIN 250 MG PO TABS
500.0000 mg | ORAL_TABLET | Freq: Once | ORAL | Status: AC
  Administered 2015-10-02: 500 mg via ORAL
  Filled 2015-10-02: qty 2

## 2015-10-02 MED ORDER — ALBUTEROL SULFATE (2.5 MG/3ML) 0.083% IN NEBU: 2.5000 mg | INHALATION_SOLUTION | Freq: Once | RESPIRATORY_TRACT | Status: DC

## 2015-10-02 NOTE — ED Notes (Signed)
Pt keep disconnecting him self from the monitor and states he is ready to left the ED, pt encouraged to wait for the MD and notified that MD is not been able to be on the room because we have several emergency going around the ED that takes some time to MD to be able to see him. Dr. Claudine Mouton notified of situation.

## 2015-10-02 NOTE — ED Notes (Signed)
Provider at the bedside.  

## 2015-10-02 NOTE — ED Provider Notes (Signed)
CSN: [id number]     Arrival date & time 10/02/15  0344 History   First MD Initiated Contact with Patient 10/02/15 [phone]     Chief Complaint  Patient presents with  . Chest Pain  . Shortness of Breath     (Consider location/radiation/quality/duration/timing/severity/associated sxs/prior Treatment) HPI  Anthony Bolton is a 65 y.o. male, with a history of COPD, CHF, and is hard of hearing, presenting to the ED with chest pain that came on at about 2 am this morning accompanied by shortness of breath and wheezing. Pt also endorses a dry cough that began about two weeks ago, which he ran out of his albuterol. Pt states he has had intermittent shortness of breath and chest pain over the last two weeks. Pt is currently pain free, however. Pt describes the pain as a stabbing, located under his left lower ribs, 8/10 when it was happening, non-radiating. Pt states he is out of his albuterol and thus didn't take anything PTA. Pt received albuterol, atrovent, and solumedrol with EMS. Pt states this relieved his pain completely. Pt adds that he has an appointment with Dr. Terrence Dupont today at 1045am to refill his medications. Pt states he ran out of his medications because he sometimes has to choose between food and his medications. Pt does not wear oxygen at home.   Past Medical History  Diagnosis Date  . COPD (chronic obstructive pulmonary disease) ([place])   . HOH (hard of hearing)   . CHF (congestive heart failure) (HCC) EF 20 - 25% in 2015  . HLD (hyperlipidemia)    Past Surgical History  Procedure Laterality Date  . Appendectomy    . Left heart catheterization with coronary angiogram N/A 11/07/2013    Procedure: LEFT HEART CATHETERIZATION WITH CORONARY ANGIOGRAM;  Surgeon: Clent Demark, MD;  Location: [facility] CATH LAB;  Service: Cardiovascular;  Laterality: N/A;  . Cardiac stents      No stent history   Family History  Problem Relation Age of Onset  . Stroke Mother    Social History  Substance Use  Topics  . Smoking status: Current Every Day Smoker -- 0.50 packs/day    Types: Cigarettes  . Smokeless tobacco: Never Used  . Alcohol Use: No    Review of Systems  Constitutional: Negative for fever and chills.  Respiratory: Positive for shortness of breath and wheezing.   Cardiovascular: Positive for chest pain.  Gastrointestinal: Negative for nausea, vomiting, abdominal pain and diarrhea.  Genitourinary: Negative for dysuria and hematuria.  Neurological: Negative for dizziness and light-headedness.  All other systems reviewed and are negative.     Allergies  Review of patient's allergies indicates no known allergies.  Home Medications   Prior to Admission medications   Medication Sig Start Date End Date Taking? Authorizing Provider  acetaminophen (TYLENOL) 325 MG tablet Take 650 mg by mouth every 6 (six) hours as needed for headache.   Yes Historical Provider, MD  albuterol (PROVENTIL) (2.5 MG/3ML) 0.083% nebulizer solution Take 6 mLs (5 mg total) by nebulization every 6 (six) hours as needed for wheezing or shortness of breath. 08/25/15  Yes Larene Pickett, PA-C  albuterol (PROVENTIL) (2.5 MG/3ML) 0.083% nebulizer solution Take 3 mLs (2.5 mg total) by nebulization every 4 (four) hours as needed for wheezing or shortness of breath. 09/08/15  Yes Orpah Greek, MD  aspirin EC 81 MG EC tablet Take 1 tablet (81 mg total) by mouth daily. 03/04/15  Yes Maryann Mikhail, DO  atorvastatin (LIPITOR) 40  MG tablet Take 1 tablet (40 mg total) by mouth at bedtime. 03/04/15  Yes Maryann Mikhail, DO  budesonide-formoterol (SYMBICORT) 160-4.5 MCG/ACT inhaler Inhale 2 puffs into the lungs 2 (two) times daily. 08/13/15  Yes Thurnell Lose, MD  methimazole (TAPAZOLE) 10 MG tablet Take 2 tablets (20 mg total) by mouth 2 (two) times daily. 08/13/15  Yes Thurnell Lose, MD  Multiple Vitamin (MULTIVITAMIN WITH MINERALS) TABS tablet Take 1 tablet by mouth daily. 03/04/15  Yes Maryann Mikhail, DO   SPIRIVA HANDIHALER 18 MCG inhalation capsule Place 1 capsule (18 mcg total) into inhaler and inhale daily. 08/13/15  Yes Thurnell Lose, MD  traZODone (DESYREL) 50 MG tablet Take 50 mg by mouth at bedtime.   Yes Historical Provider, MD  albuterol (PROVENTIL) (2.5 MG/3ML) 0.083% nebulizer solution Take 3 mLs (2.5 mg total) by nebulization every 6 (six) hours as needed for wheezing or shortness of breath. 10/02/15   [name] C [name], PA-C  azithromycin (ZITHROMAX Z-PAK) 250 MG tablet Take 1 tablet (250 mg total) by mouth daily. 10/02/15   [name] C [name], PA-C  predniSONE (DELTASONE) 20 MG tablet Take 2 tablets (40 mg total) by mouth daily. 10/02/15   [name] C [name], PA-C   BP 120/86 mmHg  Pulse 84  Temp(Src) 98.4 F (36.9 C) (Oral)  Resp 19  Ht 5\' 9"  (1.753 m)  Wt 50.349 kg  BMI 16.38 kg/m2  SpO2 95% Physical Exam  Constitutional: He appears well-developed and well-nourished. No distress.  HENT:  Head: Normocephalic and atraumatic.  Eyes: Conjunctivae are normal. Pupils are equal, round, and reactive to light.  Cardiovascular: Normal rate, regular rhythm and normal heart sounds.   Pulmonary/Chest: Effort normal. No tachypnea. No respiratory distress. He has wheezes in the right upper field, the right middle field, the right lower field and the left lower field.  Patient shows no increased work of breath and speaks in full sentences.   Abdominal: Soft. Bowel sounds are normal.  Musculoskeletal: He exhibits no edema or tenderness.  Neurological: He is alert.  Skin: Skin is warm and dry. He is not diaphoretic.  Nursing note and vitals reviewed.   ED Course  Procedures (including critical care time) Labs Review Labs Reviewed  BASIC METABOLIC PANEL - Abnormal; Notable for the following:    Sodium 133 (*)    Chloride 98 (*)    Glucose, Bld 111 (*)    All other components within normal limits  CBC  I-STAT TROPOININ, ED    Imaging Review Dg Chest 2 View  10/02/2015  CLINICAL DATA:  Acute onset  of generalized chest pain and shortness of breath. Initial encounter. EXAM: CHEST  2 VIEW COMPARISON:  Chest radiograph performed 09/08/2015 FINDINGS: The lungs are hyperexpanded, with flattening of the hemidiaphragms, compatible with COPD. Underlying peribronchial thickening is seen. A small left pleural effusion is suggested. There is no evidence of pneumothorax. The heart is normal in size; the mediastinal contour is within normal limits. No acute osseous abnormalities are seen. IMPRESSION: Findings of COPD.  Small left pleural effusion suggested. Electronically Signed   By: Garald Balding M.D.   On: 10/02/2015 04:33   I have personally reviewed and evaluated these images and lab results as part of my medical decision-making.   EKG Interpretation   Date/Time:  Wednesday October 02 2015 03:47:58 EST Ventricular Rate:  84 PR Interval:  151 QRS Duration: 85 QT Interval:  373 QTC Calculation: 441 R Axis:   71 Text Interpretation:  Sinus  rhythm Anteroseptal infarct, age indeterminate  No significant change since last tracing Confirmed by Glynn Octave [id number]) on 10/02/2015 6:31:01 AM      MDM   Final diagnoses:  Shortness of breath  COPD exacerbation (HCC)  Chest pain, unspecified chest pain type    Jenene Slicker presents with a cough for 2 weeks and shortness of breath today.  Findings and plan of care discussed with Leo Grosser, MD.  Patient improved completely from the treatments prior to arrival with EMS. He has no significant abnormalities on his labs or x-ray. This patient's presentation, combined with the fact that he has been out of his medications, is consistent with a COPD exacerbation. Patient is nontoxic appearing, not tachycardic, not tachypneic, is afebrile, maintains SPO2 of 97% on room air, and is in no apparent distress. When I went in for my evaluation, patient was offered an additional breathing treatment, but the patient stated that he felt back to normal  and only had become and because he was out of his medication. Patient requested discharge. The importance of close follow-up was stressed to the patient. Patient confirms that he will keep both of his appointments with his PCP and cardiologist today. The patient was given instructions for home care as well as return precautions. Patient voices understanding of these instructions, accepts the plan, and is comfortable with discharge.  Filed Vitals:   10/02/15 0345 10/02/15 0349 10/02/15 0350 10/02/15 0525  BP:   123/92 120/86  Pulse:   84 84  Temp:   98.4 F (36.9 C)   TempSrc:   Oral   Resp:   20 19  Height:  5\' 9"  (1.753 m)    Weight:  50.349 kg    SpO2: 98% 100% 99% 95%     Lorayne Bender, PA-C 10/02/15 0728  Leo Grosser, MD 10/03/15 [phone]

## 2015-10-02 NOTE — ED Notes (Addendum)
Pt brought to ED by [facility] from home for CP and SOB. One neb treatment of Atrovent with Albuterol given and 125 mg of Solumedrol IV given on the way to ED by [facility]. Pt denies CP on arrival lungs diminish at this time, NAD noticed. Pt is out of home medication x 3 days.

## 2015-10-02 NOTE — ED Notes (Signed)
PA at the bedside.

## 2015-10-02 NOTE — Discharge Instructions (Signed)
You have been seen today for shortness of breath and cough. Your imaging and lab tests showed no new abnormalities. Keep your appointment with your PCP and cardiologist today for follow-up. Return to ED should symptoms worsen.

## 2015-10-02 NOTE — ED Notes (Signed)
Attempted to give Dr. Claudine Mouton EKG Michela Pitcher he had to go into another room

## 2015-10-11 ENCOUNTER — Emergency Department (HOSPITAL_COMMUNITY): Payer: Medicare Other

## 2015-10-11 ENCOUNTER — Inpatient Hospital Stay (HOSPITAL_COMMUNITY)
Admission: EM | Admit: 2015-10-11 | Discharge: 2015-10-14 | DRG: 389 | Disposition: A | Discharge status: Home / Self Care | Payer: Medicare Other | Attending: Internal Medicine | Admitting: Internal Medicine

## 2015-10-11 ENCOUNTER — Encounter (HOSPITAL_COMMUNITY): Payer: Self-pay

## 2015-10-11 DIAGNOSIS — Z823 Family history of stroke: Secondary | ICD-10-CM

## 2015-10-11 DIAGNOSIS — E279 Disorder of adrenal gland, unspecified: Secondary | ICD-10-CM | POA: Diagnosis present

## 2015-10-11 DIAGNOSIS — F17210 Nicotine dependence, cigarettes, uncomplicated: Secondary | ICD-10-CM | POA: Diagnosis present

## 2015-10-11 DIAGNOSIS — Z720 Tobacco use: Secondary | ICD-10-CM | POA: Diagnosis present

## 2015-10-11 DIAGNOSIS — K5669 Other intestinal obstruction: Secondary | ICD-10-CM | POA: Diagnosis not present

## 2015-10-11 DIAGNOSIS — H9190 Unspecified hearing loss, unspecified ear: Secondary | ICD-10-CM | POA: Diagnosis present

## 2015-10-11 DIAGNOSIS — E785 Hyperlipidemia, unspecified: Secondary | ICD-10-CM | POA: Diagnosis present

## 2015-10-11 DIAGNOSIS — R079 Chest pain, unspecified: Secondary | ICD-10-CM | POA: Diagnosis present

## 2015-10-11 DIAGNOSIS — I959 Hypotension, unspecified: Secondary | ICD-10-CM | POA: Diagnosis present

## 2015-10-11 DIAGNOSIS — E039 Hypothyroidism, unspecified: Secondary | ICD-10-CM | POA: Diagnosis present

## 2015-10-11 DIAGNOSIS — R109 Unspecified abdominal pain: Secondary | ICD-10-CM | POA: Diagnosis present

## 2015-10-11 DIAGNOSIS — Z955 Presence of coronary angioplasty implant and graft: Secondary | ICD-10-CM

## 2015-10-11 DIAGNOSIS — E860 Dehydration: Secondary | ICD-10-CM | POA: Diagnosis present

## 2015-10-11 DIAGNOSIS — Z79899 Other long term (current) drug therapy: Secondary | ICD-10-CM

## 2015-10-11 DIAGNOSIS — E0590 Thyrotoxicosis, unspecified without thyrotoxic crisis or storm: Secondary | ICD-10-CM | POA: Diagnosis present

## 2015-10-11 DIAGNOSIS — E861 Hypovolemia: Secondary | ICD-10-CM | POA: Diagnosis present

## 2015-10-11 DIAGNOSIS — Z9049 Acquired absence of other specified parts of digestive tract: Secondary | ICD-10-CM

## 2015-10-11 DIAGNOSIS — I5022 Chronic systolic (congestive) heart failure: Secondary | ICD-10-CM | POA: Diagnosis present

## 2015-10-11 DIAGNOSIS — Z681 Body mass index (BMI) 19 or less, adult: Secondary | ICD-10-CM

## 2015-10-11 DIAGNOSIS — E440 Moderate protein-calorie malnutrition: Secondary | ICD-10-CM | POA: Diagnosis not present

## 2015-10-11 DIAGNOSIS — J441 Chronic obstructive pulmonary disease with (acute) exacerbation: Secondary | ICD-10-CM | POA: Diagnosis present

## 2015-10-11 DIAGNOSIS — N179 Acute kidney failure, unspecified: Secondary | ICD-10-CM | POA: Diagnosis not present

## 2015-10-11 DIAGNOSIS — N19 Unspecified kidney failure: Secondary | ICD-10-CM

## 2015-10-11 DIAGNOSIS — E278 Other specified disorders of adrenal gland: Secondary | ICD-10-CM | POA: Diagnosis present

## 2015-10-11 DIAGNOSIS — K5660 Unspecified intestinal obstruction: Principal | ICD-10-CM | POA: Diagnosis present

## 2015-10-11 DIAGNOSIS — I2510 Atherosclerotic heart disease of native coronary artery without angina pectoris: Secondary | ICD-10-CM | POA: Diagnosis present

## 2015-10-11 DIAGNOSIS — R112 Nausea with vomiting, unspecified: Secondary | ICD-10-CM | POA: Diagnosis present

## 2015-10-11 DIAGNOSIS — Z7982 Long term (current) use of aspirin: Secondary | ICD-10-CM

## 2015-10-11 DIAGNOSIS — I10 Essential (primary) hypertension: Secondary | ICD-10-CM | POA: Diagnosis present

## 2015-10-11 DIAGNOSIS — K56609 Unspecified intestinal obstruction, unspecified as to partial versus complete obstruction: Secondary | ICD-10-CM | POA: Diagnosis present

## 2015-10-11 DIAGNOSIS — J449 Chronic obstructive pulmonary disease, unspecified: Secondary | ICD-10-CM | POA: Diagnosis not present

## 2015-10-11 DIAGNOSIS — R1084 Generalized abdominal pain: Secondary | ICD-10-CM | POA: Diagnosis not present

## 2015-10-11 MED ORDER — IPRATROPIUM-ALBUTEROL 0.5-2.5 (3) MG/3ML IN SOLN
3.0000 mL | Freq: Once | RESPIRATORY_TRACT | Status: AC
  Administered 2015-10-12: 3 mL via RESPIRATORY_TRACT
  Filled 2015-10-11: qty 3

## 2015-10-11 MED ORDER — DEXAMETHASONE 4 MG PO TABS
10.0000 mg | ORAL_TABLET | Freq: Once | ORAL | Status: AC
  Administered 2015-10-12: 10 mg via ORAL
  Filled 2015-10-11: qty 2

## 2015-10-11 NOTE — ED Notes (Signed)
Patient transported by [facility] from a motel.  Patient c/o generalized abdominal pain and cold symptoms x 3 days.  Patient had one episode of vomiting yesterday.

## 2015-10-11 NOTE — ED Provider Notes (Signed)
CSN: [id number]     Arrival date & time 10/11/15  2244 History  By signing my name below, I, Irene Pap, attest that this documentation has been prepared under the direction and in the presence of Leo Grosser, MD. Electronically Signed: Irene Pap, ED Scribe. 10/11/2015. 1:27 AM.   Chief Complaint  Patient presents with  . Abdominal Pain  . Chest Pain   Patient is a 65 y.o. male presenting with abdominal pain and chest pain. The history is provided by the patient. No language interpreter was used.  Abdominal Pain Pain location:  Generalized Pain quality: aching   Pain radiates to:  Does not radiate Pain severity:  Moderate Onset quality:  Sudden Duration:  3 days Timing:  Constant Progression:  Worsening Chronicity:  New Ineffective treatments:  None tried Associated symptoms: chest pain, shortness of breath and vomiting   Chest pain:    Chest pain quality: tightness.   Severity:  Mild   Onset quality:  Sudden   Duration:  1 day   Timing:  Constant   Progression:  Worsening   Chronicity:  New Chest Pain Worsened by:  Movement (palpation) Associated symptoms: abdominal pain, shortness of breath and vomiting   HPI Comments: Anthony Bolton is a 65 y.o. Male with a hx of HOH, COPD and CHF brought in by [facility] who presents to the Emergency Department complaining of upper and middle abdominal pain onset 3 days ago. Pt states that he also began to have tight chest pain that worsens with movement and palpation starting last night. He reports one episode of vomiting yesterday and associated SOB. He reports that he uses a nebulizer and inhaler at home but has not today. He reports subjective fever and unable to sleep. Pt has not seen his PCP due to the snow.   Past Medical History  Diagnosis Date  . COPD (chronic obstructive pulmonary disease) ([place])   . HOH (hard of hearing)   . CHF (congestive heart failure) (HCC) EF 20 - 25% in 2015  . HLD (hyperlipidemia)    Past Surgical  History  Procedure Laterality Date  . Appendectomy    . Left heart catheterization with coronary angiogram N/A 11/07/2013    Procedure: LEFT HEART CATHETERIZATION WITH CORONARY ANGIOGRAM;  Surgeon: Clent Demark, MD;  Location: [facility] CATH LAB;  Service: Cardiovascular;  Laterality: N/A;  . Cardiac stents      No stent history   Family History  Problem Relation Age of Onset  . Stroke Mother    Social History  Substance Use Topics  . Smoking status: Current Every Day Smoker -- 0.50 packs/day    Types: Cigarettes  . Smokeless tobacco: Never Used  . Alcohol Use: No    Review of Systems  Respiratory: Positive for shortness of breath.   Cardiovascular: Positive for chest pain.  Gastrointestinal: Positive for vomiting and abdominal pain.  All other systems reviewed and are negative.  Allergies  Review of patient's allergies indicates no known allergies.  Home Medications   Prior to Admission medications   Medication Sig Start Date End Date Taking? Authorizing Provider  albuterol (PROVENTIL) (2.5 MG/3ML) 0.083% nebulizer solution Take 6 mLs (5 mg total) by nebulization every 6 (six) hours as needed for wheezing or shortness of breath. 08/25/15  Yes Larene Pickett, PA-C  aspirin EC 81 MG EC tablet Take 1 tablet (81 mg total) by mouth daily. 03/04/15  Yes Maryann Mikhail, DO  atorvastatin (LIPITOR) 40 MG tablet Take 1 tablet (40  mg total) by mouth at bedtime. 03/04/15   Maryann Mikhail, DO  azithromycin (ZITHROMAX Z-PAK) 250 MG tablet Take 1 tablet (250 mg total) by mouth daily. Patient not taking: Reported on 10/11/2015 10/02/15   Helane Gunther Joy, PA-C  budesonide-formoterol (SYMBICORT) 160-4.5 MCG/ACT inhaler Inhale 2 puffs into the lungs 2 (two) times daily. 08/13/15   Thurnell Lose, MD  methimazole (TAPAZOLE) 10 MG tablet Take 2 tablets (20 mg total) by mouth 2 (two) times daily. 08/13/15   Thurnell Lose, MD  Multiple Vitamin (MULTIVITAMIN WITH MINERALS) TABS tablet Take 1 tablet by mouth  daily. 03/04/15   Maryann Mikhail, DO  predniSONE (DELTASONE) 20 MG tablet Take 2 tablets (40 mg total) by mouth daily. Patient not taking: Reported on 10/11/2015 10/02/15   Shawn C Joy, PA-C  SPIRIVA HANDIHALER 18 MCG inhalation capsule Place 1 capsule (18 mcg total) into inhaler and inhale daily. 08/13/15   Thurnell Lose, MD   BP 102/86 mmHg  Pulse 101  Temp(Src) 97.9 F (36.6 C) (Oral)  Resp 20  SpO2 94% Physical Exam  Constitutional: He is oriented to person, place, and time. He appears well-developed and well-nourished. No distress.  HENT:  Head: Normocephalic and atraumatic.  Mouth/Throat: Oropharynx is clear and moist. No oropharyngeal exudate.  Trachea midline  Eyes: Conjunctivae and EOM are normal. Pupils are equal, round, and reactive to light.  Neck: Trachea normal and normal range of motion. Neck supple. No JVD present. Carotid bruit is not present.  Cardiovascular: Normal rate and regular rhythm.  Exam reveals no gallop and no friction rub.   No murmur heard. Pulmonary/Chest: Effort normal. No stridor. He has wheezes. He has rales.  Diminished breath sounds bilaterally; end expiratory wheezing; rales in the left lung field  Abdominal: Soft. Bowel sounds are normal. He exhibits no mass. There is tenderness. There is no rebound and no guarding.  Musculoskeletal: Normal range of motion.  Lymphadenopathy:    He has no cervical adenopathy.  Neurological: He is alert and oriented to person, place, and time. He has normal reflexes. No cranial nerve deficit. He exhibits normal muscle tone. Coordination normal.  Cranial nerves 2-12 intact  Skin: Skin is warm and dry. He is not diaphoretic.  Psychiatric: He has a normal mood and affect. His behavior is normal.    ED Course  Procedures (including critical care time) DIAGNOSTIC STUDIES: Oxygen Saturation is 94% on RA, normal by my interpretation.    COORDINATION OF CARE: 11:58 PM-Discussed treatment plan which includes labs,  EKG, breathing treatment and x-ray with pt at bedside and pt agreed to plan.    Labs Review Labs Reviewed  BASIC METABOLIC PANEL - Abnormal; Notable for the following:    Sodium 133 (*)    Chloride 97 (*)    CO2 21 (*)    Glucose, Bld 106 (*)    BUN 66 (*)    Creatinine, Ser 2.42 (*)    Calcium 8.3 (*)    GFR calc non Af Amer 26 (*)    GFR calc Af Amer 31 (*)    All other components within normal limits  URINALYSIS, ROUTINE W REFLEX MICROSCOPIC (NOT AT [facility]) - Abnormal; Notable for the following:    APPearance CLOUDY (*)    Protein, ur 30 (*)    All other components within normal limits  URINE MICROSCOPIC-ADD ON - Abnormal; Notable for the following:    Bacteria, UA RARE (*)    Casts WBC CAST (*)  All other components within normal limits  CBC - Abnormal; Notable for the following:    WBC 10.7 (*)    All other components within normal limits  CULTURE, EXPECTORATED SPUTUM-ASSESSMENT  CBC  PROTIME-INR  APTT  TROPONIN I  TROPONIN I  TROPONIN I  COMPREHENSIVE METABOLIC PANEL  CREATININE, URINE, RANDOM  SODIUM, URINE, RANDOM  BRAIN NATRIURETIC PEPTIDE  LIPASE, BLOOD  I-STAT TROPOININ, ED  TYPE AND SCREEN    Imaging Review Ct Abdomen Pelvis Wo Contrast  10/12/2015  CLINICAL DATA:  Acute onset of generalized abdominal pain and vomiting. Initial encounter. EXAM: CT ABDOMEN AND PELVIS WITHOUT CONTRAST TECHNIQUE: Multidetector CT imaging of the abdomen and pelvis was performed following the standard protocol without IV contrast. COMPARISON:  CT of the abdomen and pelvis from 05/02/2015 FINDINGS: The visualized lung bases are clear. There is recurrent dilatation of small bowel loops up to 7.0 cm in maximal diameter, with an apparent transition point noted at the lower mid abdomen, and decompressed distal small bowel loops. The colon is still largely filled with stool. This raises concern for small bowel obstruction, most likely secondary to an adhesion. The liver and spleen are  unremarkable in appearance. The gallbladder is within normal limits. The pancreas and right adrenal gland are unremarkable. A large 4.1 cm left adrenal adenoma is again noted. The kidneys are unremarkable in appearance. There is no evidence of hydronephrosis. No renal or ureteral stones are seen. No perinephric stranding is appreciated. No free fluid is identified. The small bowel is unremarkable in appearance. The stomach is within normal limits. No acute vascular abnormalities are seen. Mild scattered calcification is noted along the abdominal aorta and its branches. The patient is status post appendectomy. The bladder is mildly distended and grossly unremarkable. The prostate is borderline enlarged, measuring 4.9 cm in transverse dimension. No inguinal lymphadenopathy is seen. No acute osseous abnormalities are identified. Vacuum phenomenon and disc space narrowing are noted at L5-S1. IMPRESSION: 1. Recurrent dilatation of small bowel loops up to 7.0 cm in maximal diameter, with an apparent transition point at the lower mid abdomen, and decompressed distal small bowel loops. The colon is still largely filled with stool. This is concerning for relatively high-grade small bowel obstruction, most likely secondary to adhesion. 2. Mild scattered calcification along the abdominal aorta and its branches. 3. Large 4.1 cm left adrenal adenoma again noted. 4. Borderline enlarged prostate. Electronically Signed   By: Garald Balding M.D.   On: 10/12/2015 04:18   Dg Chest 2 View  10/11/2015  CLINICAL DATA:  Upper and mid abdominal pain for 3 days, chest pain beginning last night. Poor historian. History of COPD, CHF. EXAM: CHEST  2 VIEW COMPARISON:  Chest radiograph October 02, 2015 FINDINGS: Increased lung volumes with LEFT greater than RIGHT apical lucencies most compatible with bullous changes and COPD with flattened hemidiaphragms. No pleural effusion or focal consolidation. RIGHT midlung zone scarring. Mildly elevated  LEFT hemidiaphragm. Cardiomediastinal silhouette is normal. No pneumothorax. Soft tissue planes and included osseous structures are normal. IMPRESSION: COPD, no superimposed acute cardiopulmonary process. Electronically Signed   By: Elon Alas M.D.   On: 10/11/2015 23:51   I have personally reviewed and evaluated these images and lab results as part of my medical decision-making.   EKG Interpretation   Date/Time:  Friday October 11 2015 22:57:48 EST Ventricular Rate:  95 PR Interval:  139 QRS Duration: 85 QT Interval:  333 QTC Calculation: 419 R Axis:   -17 Text Interpretation:  Sinus rhythm Atrial premature complexes Consider  right atrial enlargement Borderline left axis deviation No significant  change since last tracing Confirmed by Raistlin Gum MD, Kaizen Ibsen [id number]) on  10/11/2015 11:05:04 PM      MDM   Final diagnoses:  AKI (acute kidney injury) ([place])  Uremia  Abdominal pain  SBO (small bowel obstruction) ([place])    65 year old male presents with concern for abdominal pain and vomiting. Very poor historian and on initial evaluation the patient was not forthcoming about his symptoms and complained only of shortness of breath refractory to his home therapies. Screening laboratory work is concerning for an acute kidney injury. CT scan of the abdomen and pelvis was ordered as patient has prior history of bowel obstruction. Findings are consistent with another acute small bowel obstruction. Hospitalist was consulted for admission and will see the patient in the emergency department. Requesting general surgery consultation from the ED and general surgery was paged and informed of patient.   I personally performed the services described in this documentation, which was scribed in my presence. The recorded information has been reviewed and is accurate.     Leo Grosser, MD 10/12/15 [phone]

## 2015-10-11 NOTE — ED Notes (Signed)
Bed: RESA Expected date:  Expected time:  Means of arrival:  Comments: EMS male with SOB/abdominal pain

## 2015-10-11 NOTE — ED Notes (Signed)
Patient c/o upper and mid abdominal pain x 3 days.  Patient also c/o chest pain since last night.  Patient reports chest is tender to touch and movement.  Patient states he had one episode of vomiting yesterday.  Patient is poor historian.

## 2015-10-12 ENCOUNTER — Observation Stay (HOSPITAL_COMMUNITY): Payer: Medicare Other

## 2015-10-12 DIAGNOSIS — N179 Acute kidney failure, unspecified: Secondary | ICD-10-CM | POA: Diagnosis not present

## 2015-10-12 DIAGNOSIS — K6389 Other specified diseases of intestine: Secondary | ICD-10-CM | POA: Diagnosis not present

## 2015-10-12 DIAGNOSIS — R1084 Generalized abdominal pain: Secondary | ICD-10-CM | POA: Diagnosis not present

## 2015-10-12 DIAGNOSIS — Z9049 Acquired absence of other specified parts of digestive tract: Secondary | ICD-10-CM | POA: Diagnosis not present

## 2015-10-12 DIAGNOSIS — R109 Unspecified abdominal pain: Secondary | ICD-10-CM | POA: Diagnosis present

## 2015-10-12 DIAGNOSIS — E860 Dehydration: Secondary | ICD-10-CM | POA: Diagnosis present

## 2015-10-12 DIAGNOSIS — K5660 Unspecified intestinal obstruction: Secondary | ICD-10-CM | POA: Diagnosis present

## 2015-10-12 DIAGNOSIS — I10 Essential (primary) hypertension: Secondary | ICD-10-CM | POA: Diagnosis present

## 2015-10-12 DIAGNOSIS — Z681 Body mass index (BMI) 19 or less, adult: Secondary | ICD-10-CM | POA: Diagnosis not present

## 2015-10-12 DIAGNOSIS — R079 Chest pain, unspecified: Secondary | ICD-10-CM | POA: Diagnosis present

## 2015-10-12 DIAGNOSIS — E785 Hyperlipidemia, unspecified: Secondary | ICD-10-CM | POA: Diagnosis present

## 2015-10-12 DIAGNOSIS — Z7982 Long term (current) use of aspirin: Secondary | ICD-10-CM | POA: Diagnosis not present

## 2015-10-12 DIAGNOSIS — E039 Hypothyroidism, unspecified: Secondary | ICD-10-CM | POA: Diagnosis present

## 2015-10-12 DIAGNOSIS — K5669 Other intestinal obstruction: Secondary | ICD-10-CM | POA: Diagnosis not present

## 2015-10-12 DIAGNOSIS — K56609 Unspecified intestinal obstruction, unspecified as to partial versus complete obstruction: Secondary | ICD-10-CM | POA: Diagnosis present

## 2015-10-12 DIAGNOSIS — E861 Hypovolemia: Secondary | ICD-10-CM | POA: Diagnosis present

## 2015-10-12 DIAGNOSIS — R112 Nausea with vomiting, unspecified: Secondary | ICD-10-CM | POA: Diagnosis not present

## 2015-10-12 DIAGNOSIS — J441 Chronic obstructive pulmonary disease with (acute) exacerbation: Secondary | ICD-10-CM | POA: Diagnosis not present

## 2015-10-12 DIAGNOSIS — Z720 Tobacco use: Secondary | ICD-10-CM | POA: Diagnosis not present

## 2015-10-12 DIAGNOSIS — E0590 Thyrotoxicosis, unspecified without thyrotoxic crisis or storm: Secondary | ICD-10-CM

## 2015-10-12 DIAGNOSIS — H9190 Unspecified hearing loss, unspecified ear: Secondary | ICD-10-CM | POA: Diagnosis present

## 2015-10-12 DIAGNOSIS — F17210 Nicotine dependence, cigarettes, uncomplicated: Secondary | ICD-10-CM | POA: Diagnosis present

## 2015-10-12 DIAGNOSIS — I5022 Chronic systolic (congestive) heart failure: Secondary | ICD-10-CM

## 2015-10-12 DIAGNOSIS — I959 Hypotension, unspecified: Secondary | ICD-10-CM | POA: Diagnosis not present

## 2015-10-12 DIAGNOSIS — Z823 Family history of stroke: Secondary | ICD-10-CM | POA: Diagnosis not present

## 2015-10-12 DIAGNOSIS — E279 Disorder of adrenal gland, unspecified: Secondary | ICD-10-CM | POA: Diagnosis present

## 2015-10-12 DIAGNOSIS — I2510 Atherosclerotic heart disease of native coronary artery without angina pectoris: Secondary | ICD-10-CM | POA: Diagnosis present

## 2015-10-12 DIAGNOSIS — E440 Moderate protein-calorie malnutrition: Secondary | ICD-10-CM | POA: Diagnosis not present

## 2015-10-12 DIAGNOSIS — Z79899 Other long term (current) drug therapy: Secondary | ICD-10-CM | POA: Diagnosis not present

## 2015-10-12 DIAGNOSIS — Z955 Presence of coronary angioplasty implant and graft: Secondary | ICD-10-CM | POA: Diagnosis not present

## 2015-10-12 LAB — PROTIME-INR
INR: 1.06 (ref 0.00–1.49)
PROTHROMBIN TIME: 14 s (ref 11.6–15.2)

## 2015-10-12 LAB — CBC
HCT: 46.7 % (ref 39.0–52.0)
HEMATOCRIT: 43.6 % (ref 39.0–52.0)
Hemoglobin: 14.5 g/dL (ref 13.0–17.0)
Hemoglobin: 15.9 g/dL (ref 13.0–17.0)
MCH: 29.8 pg (ref 26.0–34.0)
MCH: 30.3 pg (ref 26.0–34.0)
MCHC: 33.3 g/dL (ref 30.0–36.0)
MCHC: 34 g/dL (ref 30.0–36.0)
MCV: 89.1 fL (ref 78.0–100.0)
MCV: 89.5 fL (ref 78.0–100.0)
PLATELETS: 160 10*3/uL (ref 150–400)
Platelets: 163 10*3/uL (ref 150–400)
RBC: 4.87 MIL/uL (ref 4.22–5.81)
RBC: 5.24 MIL/uL (ref 4.22–5.81)
RDW: 13.8 % (ref 11.5–15.5)
RDW: 13.9 % (ref 11.5–15.5)
WBC: 10.3 10*3/uL (ref 4.0–10.5)
WBC: 10.7 10*3/uL — ABNORMAL HIGH (ref 4.0–10.5)

## 2015-10-12 LAB — COMPREHENSIVE METABOLIC PANEL
ALBUMIN: 3.8 g/dL (ref 3.5–5.0)
ALT: 14 U/L — ABNORMAL LOW (ref 17–63)
AST: 18 U/L (ref 15–41)
Alkaline Phosphatase: 51 U/L (ref 38–126)
Anion gap: 11 (ref 5–15)
BUN: 59 mg/dL — AB (ref 6–20)
CHLORIDE: 101 mmol/L (ref 101–111)
CO2: 21 mmol/L — AB (ref 22–32)
Calcium: 8.4 mg/dL — ABNORMAL LOW (ref 8.9–10.3)
Creatinine, Ser: 1.67 mg/dL — ABNORMAL HIGH (ref 0.61–1.24)
GFR calc Af Amer: 48 mL/min — ABNORMAL LOW (ref >60)
GFR calc non Af Amer: 41 mL/min — ABNORMAL LOW (ref >60)
GLUCOSE: 116 mg/dL — AB (ref 65–99)
POTASSIUM: 4.3 mmol/L (ref 3.5–5.1)
Sodium: 133 mmol/L — ABNORMAL LOW (ref 135–145)
Total Bilirubin: 0.9 mg/dL (ref 0.3–1.2)
Total Protein: 6.3 g/dL — ABNORMAL LOW (ref 6.5–8.1)

## 2015-10-12 LAB — CREATININE, URINE, RANDOM: CREATININE, URINE: 91.97 mg/dL

## 2015-10-12 LAB — BASIC METABOLIC PANEL
Anion gap: 15 (ref 5–15)
BUN: 66 mg/dL — AB (ref 6–20)
CALCIUM: 8.3 mg/dL — AB (ref 8.9–10.3)
CO2: 21 mmol/L — AB (ref 22–32)
CREATININE: 2.42 mg/dL — AB (ref 0.61–1.24)
Chloride: 97 mmol/L — ABNORMAL LOW (ref 101–111)
GFR calc non Af Amer: 26 mL/min — ABNORMAL LOW (ref >60)
GFR, EST AFRICAN AMERICAN: 31 mL/min — AB (ref >60)
GLUCOSE: 106 mg/dL — AB (ref 65–99)
Potassium: 4.6 mmol/L (ref 3.5–5.1)
Sodium: 133 mmol/L — ABNORMAL LOW (ref 135–145)

## 2015-10-12 LAB — CORTISOL: Cortisol, Plasma: 7.4 ug/dL

## 2015-10-12 LAB — TYPE AND SCREEN
ABO/RH(D): A POS
Antibody Screen: NEGATIVE

## 2015-10-12 LAB — APTT: aPTT: 30 seconds (ref 24–37)

## 2015-10-12 LAB — TROPONIN I: Troponin I: 0.03 ng/mL (ref <0.031)

## 2015-10-12 LAB — URINALYSIS, ROUTINE W REFLEX MICROSCOPIC
BILIRUBIN URINE: NEGATIVE
GLUCOSE, UA: NEGATIVE mg/dL
Hgb urine dipstick: NEGATIVE
KETONES UR: NEGATIVE mg/dL
Leukocytes, UA: NEGATIVE
Nitrite: NEGATIVE
PH: 5.5 (ref 5.0–8.0)
Protein, ur: 30 mg/dL — AB
SPECIFIC GRAVITY, URINE: 1.015 (ref 1.005–1.030)

## 2015-10-12 LAB — URINE MICROSCOPIC-ADD ON
RBC / HPF: NONE SEEN RBC/hpf (ref 0–5)
Squamous Epithelial / LPF: NONE SEEN

## 2015-10-12 LAB — CBG MONITORING, ED: Glucose-Capillary: 108 mg/dL — ABNORMAL HIGH (ref 65–99)

## 2015-10-12 LAB — LACTIC ACID, PLASMA
Lactic Acid, Venous: 1.2 mmol/L (ref 0.5–2.0)
Lactic Acid, Venous: 1.9 mmol/L (ref 0.5–2.0)

## 2015-10-12 LAB — TSH: TSH: 0.07 u[IU]/mL — AB (ref 0.350–4.500)

## 2015-10-12 LAB — I-STAT TROPONIN, ED: TROPONIN I, POC: 0.02 ng/mL (ref 0.00–0.08)

## 2015-10-12 LAB — LIPASE, BLOOD: Lipase: 24 U/L (ref 11–51)

## 2015-10-12 LAB — SODIUM, URINE, RANDOM: SODIUM UR: 46 mmol/L

## 2015-10-12 LAB — MRSA PCR SCREENING: MRSA BY PCR: POSITIVE — AB

## 2015-10-12 LAB — BRAIN NATRIURETIC PEPTIDE: B NATRIURETIC PEPTIDE 5: 16 pg/mL (ref 0.0–100.0)

## 2015-10-12 MED ORDER — ACETAMINOPHEN 325 MG PO TABS: 650.0000 mg | ORAL_TABLET | Freq: Four times a day (QID) | ORAL | Status: DC | PRN

## 2015-10-12 MED ORDER — SODIUM CHLORIDE 0.9 % IJ SOLN
3.0000 mL | Freq: Two times a day (BID) | INTRAMUSCULAR | Status: DC
  Administered 2015-10-12 – 2015-10-13 (×3): 3 mL via INTRAVENOUS

## 2015-10-12 MED ORDER — DM-GUAIFENESIN ER 30-600 MG PO TB12
1.0000 | ORAL_TABLET | Freq: Two times a day (BID) | ORAL | Status: DC
  Administered 2015-10-13 – 2015-10-14 (×3): 1 via ORAL
  Filled 2015-10-12 (×6): qty 1

## 2015-10-12 MED ORDER — CHLORHEXIDINE GLUCONATE CLOTH 2 % EX PADS
6.0000 | MEDICATED_PAD | Freq: Every day | CUTANEOUS | Status: DC
  Administered 2015-10-13 – 2015-10-14 (×2): 6 via TOPICAL

## 2015-10-12 MED ORDER — ALBUTEROL SULFATE (2.5 MG/3ML) 0.083% IN NEBU: 2.5000 mg | INHALATION_SOLUTION | RESPIRATORY_TRACT | Status: DC | PRN

## 2015-10-12 MED ORDER — ALBUTEROL SULFATE (2.5 MG/3ML) 0.083% IN NEBU: 5.0000 mg | INHALATION_SOLUTION | RESPIRATORY_TRACT | Status: DC | PRN

## 2015-10-12 MED ORDER — SODIUM CHLORIDE 0.9 % IV SOLN
INTRAVENOUS | Status: DC
  Administered 2015-10-12: 08:00:00 via INTRAVENOUS
  Administered 2015-10-13: 1000 mL via INTRAVENOUS
  Administered 2015-10-14: 01:00:00 via INTRAVENOUS

## 2015-10-12 MED ORDER — CHLORHEXIDINE GLUCONATE 0.12 % MT SOLN
15.0000 mL | Freq: Two times a day (BID) | OROMUCOSAL | Status: DC
  Administered 2015-10-12 – 2015-10-14 (×3): 15 mL via OROMUCOSAL
  Filled 2015-10-12 (×3): qty 15

## 2015-10-12 MED ORDER — SODIUM CHLORIDE 0.9 % IV BOLUS (SEPSIS)
1000.0000 mL | INTRAVENOUS | Status: DC | PRN
  Administered 2015-10-12: 1000 mL via INTRAVENOUS

## 2015-10-12 MED ORDER — IPRATROPIUM-ALBUTEROL 0.5-2.5 (3) MG/3ML IN SOLN
3.0000 mL | Freq: Four times a day (QID) | RESPIRATORY_TRACT | Status: DC | PRN
  Administered 2015-10-13 – 2015-10-14 (×4): 3 mL via RESPIRATORY_TRACT
  Filled 2015-10-12 (×4): qty 3

## 2015-10-12 MED ORDER — METHYLPREDNISOLONE SODIUM SUCC 125 MG IJ SOLR
60.0000 mg | Freq: Every day | INTRAMUSCULAR | Status: DC
  Administered 2015-10-12 – 2015-10-13 (×2): 60 mg via INTRAVENOUS
  Filled 2015-10-12 (×2): qty 2

## 2015-10-12 MED ORDER — ASPIRIN 300 MG RE SUPP
150.0000 mg | Freq: Every day | RECTAL | Status: DC
  Administered 2015-10-12: 150 mg via RECTAL
  Filled 2015-10-12: qty 1

## 2015-10-12 MED ORDER — ATORVASTATIN CALCIUM 40 MG PO TABS
40.0000 mg | ORAL_TABLET | Freq: Every day | ORAL | Status: DC
  Administered 2015-10-13: 40 mg via ORAL
  Filled 2015-10-12 (×3): qty 1

## 2015-10-12 MED ORDER — ACETAMINOPHEN 650 MG RE SUPP
650.0000 mg | Freq: Four times a day (QID) | RECTAL | Status: DC | PRN
Start: 2015-10-12 — End: 2015-10-14

## 2015-10-12 MED ORDER — MORPHINE SULFATE (PF) 2 MG/ML IV SOLN: 2.0000 mg | INTRAVENOUS | Status: DC | PRN

## 2015-10-12 MED ORDER — IPRATROPIUM-ALBUTEROL 0.5-2.5 (3) MG/3ML IN SOLN
3.0000 mL | RESPIRATORY_TRACT | Status: DC
  Administered 2015-10-12 (×2): 3 mL via RESPIRATORY_TRACT
  Filled 2015-10-12: qty 3

## 2015-10-12 MED ORDER — ADULT MULTIVITAMIN W/MINERALS CH
1.0000 | ORAL_TABLET | Freq: Every day | ORAL | Status: DC
  Administered 2015-10-14: 1 via ORAL
  Filled 2015-10-12: qty 1

## 2015-10-12 MED ORDER — SODIUM CHLORIDE 0.9 % IV BOLUS (SEPSIS)
1000.0000 mL | Freq: Once | INTRAVENOUS | Status: AC
  Administered 2015-10-12: 1000 mL via INTRAVENOUS

## 2015-10-12 MED ORDER — CETYLPYRIDINIUM CHLORIDE 0.05 % MT LIQD: 7.0000 mL | Freq: Two times a day (BID) | OROMUCOSAL | Status: DC

## 2015-10-12 MED ORDER — MUPIROCIN 2 % EX OINT
1.0000 "application " | TOPICAL_OINTMENT | Freq: Two times a day (BID) | CUTANEOUS | Status: DC
  Administered 2015-10-12 – 2015-10-14 (×4): 1 via NASAL
  Filled 2015-10-12 (×3): qty 22

## 2015-10-12 MED ORDER — ONDANSETRON HCL 4 MG PO TABS
4.0000 mg | ORAL_TABLET | Freq: Four times a day (QID) | ORAL | Status: DC | PRN
Start: 2015-10-12 — End: 2015-10-14

## 2015-10-12 MED ORDER — METHIMAZOLE 10 MG PO TABS
20.0000 mg | ORAL_TABLET | Freq: Two times a day (BID) | ORAL | Status: DC
  Filled 2015-10-12 (×4): qty 2

## 2015-10-12 MED ORDER — IOHEXOL 300 MG/ML  SOLN
50.0000 mL | Freq: Once | INTRAMUSCULAR | Status: AC | PRN
  Administered 2015-10-12: 50 mL via ORAL

## 2015-10-12 MED ORDER — ONDANSETRON HCL 4 MG/2ML IJ SOLN: 4.0000 mg | Freq: Four times a day (QID) | INTRAMUSCULAR | Status: DC | PRN

## 2015-10-12 NOTE — ED Notes (Signed)
Hospitalist at bedside 

## 2015-10-12 NOTE — Progress Notes (Signed)
CRITICAL VALUE ALERT  Critical value received:  MRSA positive  Date of notification:  10/12/2015  Time of notification:  1953  Critical value read back: yes  Nurse who received alert:  Dyann Ruddle, RN  MD notified (1st page):  Protocol in place  Time of first page:    MD notified (2nd page):  Time of second page:  Responding MD:    Time MD responded:

## 2015-10-12 NOTE — ED Notes (Signed)
Sent hospitalist message that pt has had consistently low BP's. Dr Clementeen Graham called and stated that he wants to give the pt a bolus of NS.

## 2015-10-12 NOTE — ED Notes (Signed)
Patient pulled NG tube out - states it was hurting him and he wanted it removed.  Attempted to explain to patient importance of having tube in place, and patient refused to have tube re-inserted.

## 2015-10-12 NOTE — Consult Note (Signed)
Reason for Consult: Abdominal pain Referring Physician:  Dr. Ronney Lion is an 65 y.o. Bolton.  HPI:  The patient is a Anthony year old white Bolton who was brought to the emergency department with apparently 3 days of abdominal pain with nausea and vomiting. The patient seems confused and will not tell me any history. The only thing the patient states is that he pulled the tube out of his nose and that he feels better. He does not remember what abdominal surgeries he has had no past. He does not remember his last bowel movement or flatus. He does not know how long he has felt bad. History is mostly obtained from the emergency room report. There is no family with the patient.  Past Medical History  Diagnosis Date  . COPD (chronic obstructive pulmonary disease) ([place])   . HOH (hard of hearing)   . CHF (congestive heart failure) (HCC) EF 20 - 25% in 2015  . HLD (hyperlipidemia)     Past Surgical History  Procedure Laterality Date  . Appendectomy    . Left heart catheterization with coronary angiogram N/A 11/07/2013    Procedure: LEFT HEART CATHETERIZATION WITH CORONARY ANGIOGRAM;  Surgeon: Clent Demark, MD;  Location: [facility] CATH LAB;  Service: Cardiovascular;  Laterality: N/A;  . Cardiac stents      No stent history    Family History  Problem Relation Age of Onset  . Stroke Mother     Social History:  reports that he has been smoking Cigarettes.  He has been smoking about 0.50 packs per day. He has never used smokeless tobacco. He reports that he does not drink alcohol or use illicit drugs.  Allergies: No Known Allergies  Medications: I have reviewed the patient's current medications.  Results for orders placed or performed during the hospital encounter of 10/11/15 (from the past 48 hour(s))  Basic metabolic panel     Status: Abnormal   Collection Time: 10/12/15 12:18 AM  Result Value Ref Range   Sodium 133 (L) 135 - 145 mmol/L   Potassium 4.6 3.5 - 5.1 mmol/L   Chloride 97 (L)  101 - 111 mmol/L   CO2 21 (L) 22 - 32 mmol/L   Glucose, Bld 106 (H) 65 - 99 mg/dL   BUN 66 (H) 6 - 20 mg/dL   Creatinine, Ser 2.42 (H) 0.61 - 1.24 mg/dL   Calcium 8.3 (L) 8.9 - 10.3 mg/dL   GFR calc non Af Amer 26 (L) >60 mL/min   GFR calc Af Amer 31 (L) >60 mL/min    Comment: (NOTE) The eGFR has been calculated using the CKD EPI equation. This calculation has not been validated in all clinical situations. eGFR's persistently <60 mL/min signify possible Chronic Kidney Disease.    Anion gap 15 5 - 15  CBC     Status: None   Collection Time: 10/12/15 12:18 AM  Result Value Ref Range   WBC 10.3 4.0 - 10.5 K/uL   RBC 5.24 4.22 - 5.81 MIL/uL   Hemoglobin 15.9 13.0 - 17.0 g/dL   HCT 46.7 39.0 - 52.0 %   MCV 89.1 78.0 - 100.0 fL   MCH 30.3 26.0 - 34.0 pg   MCHC 34.0 30.0 - 36.0 g/dL   RDW 13.9 11.5 - 15.5 %   Platelets 160 150 - 400 K/uL  I-stat troponin, ED (not at [facility])     Status: None   Collection Time: 10/12/15 12:39 AM  Result Value Ref Range  Troponin i, poc 0.02 0.00 - 0.08 ng/mL   Comment 3            Comment: Due to the release kinetics of cTnI, a negative result within the first hours of the onset of symptoms does not rule out myocardial infarction with certainty. If myocardial infarction is still suspected, repeat the test at appropriate intervals.   Urinalysis, Routine w reflex microscopic (not at [facility])     Status: Abnormal   Collection Time: 10/12/15  2:48 AM  Result Value Ref Range   Color, Urine YELLOW YELLOW   APPearance CLOUDY (A) CLEAR   Specific Gravity, Urine 1.015 1.005 - 1.030   pH 5.5 5.0 - 8.0   Glucose, UA NEGATIVE NEGATIVE mg/dL   Hgb urine dipstick NEGATIVE NEGATIVE   Bilirubin Urine NEGATIVE NEGATIVE   Ketones, ur NEGATIVE NEGATIVE mg/dL   Protein, ur 30 (A) NEGATIVE mg/dL   Nitrite NEGATIVE NEGATIVE   Leukocytes, UA NEGATIVE NEGATIVE  Urine microscopic-add on     Status: Abnormal   Collection Time: 10/12/15  2:48 AM  Result Value Ref  Range   Squamous Epithelial / LPF NONE SEEN NONE SEEN   WBC, UA 6-30 0 - 5 WBC/hpf   RBC / HPF NONE SEEN 0 - 5 RBC/hpf   Bacteria, UA RARE (A) NONE SEEN   Casts WBC CAST (A) NEGATIVE    Comment: HYALINE CASTS  Protime-INR     Status: None   Collection Time: 10/12/15  5:14 AM  Result Value Ref Range   Prothrombin Time 14.0 11.6 - 15.2 seconds   INR 1.06 0.00 - 1.49  APTT     Status: None   Collection Time: 10/12/15  5:14 AM  Result Value Ref Range   aPTT 30 24 - 37 seconds  Brain natriuretic peptide     Status: None   Collection Time: 10/12/15  5:17 AM  Result Value Ref Range   B Natriuretic Peptide 16.0 0.0 - 100.0 pg/mL  Comprehensive metabolic panel     Status: Abnormal   Collection Time: 10/12/15  5:49 AM  Result Value Ref Range   Sodium 133 (L) 135 - 145 mmol/L   Potassium 4.3 3.5 - 5.1 mmol/L   Chloride 101 101 - 111 mmol/L   CO2 21 (L) 22 - 32 mmol/L   Glucose, Bld 116 (H) 65 - 99 mg/dL   BUN 59 (H) 6 - 20 mg/dL   Creatinine, Ser 1.67 (H) 0.61 - 1.24 mg/dL   Calcium 8.4 (L) 8.9 - 10.3 mg/dL   Total Protein 6.3 (L) 6.5 - 8.1 g/dL   Albumin 3.8 3.5 - 5.0 g/dL   AST 18 15 - 41 U/L   ALT 14 (L) 17 - 63 U/L   Alkaline Phosphatase 51 Anthony - 126 U/L   Total Bilirubin 0.9 0.3 - 1.2 mg/dL   GFR calc non Af Amer 41 (L) >60 mL/min   GFR calc Af Amer 48 (L) >60 mL/min    Comment: (NOTE) The eGFR has been calculated using the CKD EPI equation. This calculation has not been validated in all clinical situations. eGFR's persistently <60 mL/min signify possible Chronic Kidney Disease.    Anion gap 11 5 - 15  CBC     Status: Abnormal   Collection Time: 10/12/15  5:49 AM  Result Value Ref Range   WBC 10.7 (H) 4.0 - 10.5 K/uL   RBC 4.87 4.22 - 5.81 MIL/uL   Hemoglobin 14.5 13.0 - 17.0 g/dL  HCT 43.6 39.0 - 52.0 %   MCV 89.5 78.0 - 100.0 fL   MCH 29.8 26.0 - 34.0 pg   MCHC 33.3 30.0 - 36.0 g/dL   RDW 13.8 11.5 - 15.5 %   Platelets 163 150 - 400 K/uL  Lipase, blood      Status: None   Collection Time: 10/12/15  5:49 AM  Result Value Ref Range   Lipase 24 11 - 51 U/L    Ct Abdomen Pelvis Wo Contrast  10/12/2015  CLINICAL DATA:  Acute onset of generalized abdominal pain and vomiting. Initial encounter. EXAM: CT ABDOMEN AND PELVIS WITHOUT CONTRAST TECHNIQUE: Multidetector CT imaging of the abdomen and pelvis was performed following the standard protocol without IV contrast. COMPARISON:  CT of the abdomen and pelvis from 05/02/2015 FINDINGS: The visualized lung bases are clear. There is recurrent dilatation of small bowel loops up to 7.0 cm in maximal diameter, with an apparent transition point noted at the lower mid abdomen, and decompressed distal small bowel loops. The colon is still largely filled with stool. This raises concern for small bowel obstruction, most likely secondary to an adhesion. The liver and spleen are unremarkable in appearance. The gallbladder is within normal limits. The pancreas and right adrenal gland are unremarkable. A large 4.1 cm left adrenal adenoma is again noted. The kidneys are unremarkable in appearance. There is no evidence of hydronephrosis. No renal or ureteral stones are seen. No perinephric stranding is appreciated. No free fluid is identified. The small bowel is unremarkable in appearance. The stomach is within normal limits. No acute vascular abnormalities are seen. Mild scattered calcification is noted along the abdominal aorta and its branches. The patient is status post appendectomy. The bladder is mildly distended and grossly unremarkable. The prostate is borderline enlarged, measuring 4.9 cm in transverse dimension. No inguinal lymphadenopathy is seen. No acute osseous abnormalities are identified. Vacuum phenomenon and disc space narrowing are noted at L5-S1. IMPRESSION: 1. Recurrent dilatation of small bowel loops up to 7.0 cm in maximal diameter, with an apparent transition point at the lower mid abdomen, and decompressed distal  small bowel loops. The colon is still largely filled with stool. This is concerning for relatively high-grade small bowel obstruction, most likely secondary to adhesion. 2. Mild scattered calcification along the abdominal aorta and its branches. 3. Large 4.1 cm left adrenal adenoma again noted. 4. Borderline enlarged prostate. Electronically Signed   By: Garald Balding M.D.   On: 10/12/2015 04:18   Dg Chest 2 View  10/11/2015  CLINICAL DATA:  Upper and mid abdominal pain for 3 days, chest pain beginning last night. Poor historian. History of COPD, CHF. EXAM: CHEST  2 VIEW COMPARISON:  Chest radiograph October 02, 2015 FINDINGS: Increased lung volumes with LEFT greater than RIGHT apical lucencies most compatible with bullous changes and COPD with flattened hemidiaphragms. No pleural effusion or focal consolidation. RIGHT midlung zone scarring. Mildly elevated LEFT hemidiaphragm. Cardiomediastinal silhouette is normal. No pneumothorax. Soft tissue planes and included osseous structures are normal. IMPRESSION: COPD, no superimposed acute cardiopulmonary process. Electronically Signed   By: Elon Alas M.D.   On: 10/11/2015 23:51    Review of Systems  Constitutional: Negative.   HENT: Positive for hearing loss.   Eyes: Negative.   Respiratory: Positive for shortness of breath.   Cardiovascular: Positive for chest pain.  Gastrointestinal: Positive for nausea, vomiting and abdominal pain.  Genitourinary: Negative.   Musculoskeletal: Negative.   Skin: Negative.   Neurological: Negative.  Endo/Heme/Allergies: Negative.   Psychiatric/Behavioral: Positive for memory loss.   Blood pressure 93/65, pulse 83, temperature 97.9 F (36.6 C), temperature source Oral, resp. rate 16, SpO2 88 %. Physical Exam  Constitutional:  Elderly wm in nad  HENT:  Head: Normocephalic and atraumatic.  Eyes: Conjunctivae and EOM are normal. Pupils are equal, round, and reactive to light.  Neck: Normal range of  motion. Neck supple.  Cardiovascular: Normal rate, regular rhythm and normal heart sounds.   Respiratory: Effort normal and breath sounds normal.  GI: Soft. Bowel sounds are normal. There is no tenderness.  Musculoskeletal: Normal range of motion.  Neurological: He is alert.  Confused. Poor historian  Skin: Skin is warm and dry.  Psychiatric:  confused    Assessment/Plan:  The patient appears to have at least a partial small bowel obstruction possibly related to previous surgery. At the time of my exam his abdomen was soft with good bowel sounds and he did not complain of any abdominal tenderness. He has removed his NG tube. I would agree with bowel rest and close monitoring. If he continues to improve then he may not require any surgical intervention. We will follow him closely with you.  TOTH III,Amalya Salmons S 10/12/2015, 6:37 AM

## 2015-10-12 NOTE — ED Notes (Signed)
Sent Dr Clementeen Graham message that pts BP is 80/52 post 1 liter NS bolus. Hospitalist called and sts that he wants pt to have another liter NS and he will change bed to stepdown

## 2015-10-12 NOTE — ED Notes (Signed)
ICU sts that they are not ready for pt at this time and will advise as soon as pt can be transported

## 2015-10-12 NOTE — ED Notes (Signed)
Per Agricultural consultant, there are no floor beds at this time and no discharges. Will continue to hold pt in the ED until beds are available. Pt resting comfortably at this time

## 2015-10-12 NOTE — ED Notes (Signed)
Pt quietly sleeping in fetal position with even, unlabored resp. Pt in NAD. Will continue to monitor

## 2015-10-12 NOTE — ED Notes (Addendum)
Attempted to call report to number given by secretary but there was no answer. Will try again within 20 minutes and transport pt

## 2015-10-12 NOTE — H&P (Signed)
Anthony Bolton History and Physical  THADDAEUS CAPOBIANCO [id number] DOB: Nov 06, 1950 DOA: 10/11/2015  Referring physician: ED physician PCP: [name], MD  Specialists:   Chief Complaint: Abdominal pain, nausea and vomiting, chest tightness and SOB  HPI: Anthony Bolton is a 65 y.o. male with PMH of COPD, sCHF with EF of 50-55%, poor hearing, hyperlipidemia, tobacco abuse, hypothyroidism, CAD, who presents with abdominal pain, nausea, vomiting and chest tightness and SOB.  Patient has very poor hearing and appears to be a poor historian. The history is very limited. Most of the history is obtained by discussing the case with ED physician, per EMS report, and with the nursing staff and per pt himself. It seems that patient has been having abdominal pain in the past 3 days. He had one episode of vomiting yesterday, but denies diarrhea. He also has chest tightness. He has mild cough and shortness of breath. No fever or chills. He denies symptoms of UTI. He moves all extremities.  In ED, patient was found to have negative troponin, oxygen saturation at 90's, WBC 10.3, temperature normal, slightly tachycardia, acute renal injury. Chest x-ray showed COPD change without infiltration. CT-abdomen/pelvis that showed high-grade small bowel obstruction. Patient admitted to inpatient for further eval and treatment for Gen. surgery was consulted by ED.  EKG: Independently reviewed. QTC 419, PAC, LAD, RAE.  Where does patient live?   At home    Can patient participate in ADLs?  Little  Review of Systems:   General: no fevers, chills, no changes in body weight, has poor appetite, has fatigue HEENT: no blurry vision, very poor hearing, no sore throat Pulm: has dyspnea, coughing, wheezing CV: has chest wall pain, no palpitations Abd: has nausea, vomiting, abdominal pain, no diarrhea, constipation GU: no dysuria, burning on urination, increased urinary frequency, hematuria  Ext: no leg edema Neuro:  no unilateral weakness, numbness, or tingling, no vision change or hearing loss Skin: no rash MSK: No muscle spasm, no deformity, no limitation of range of movement in spin Heme: No easy bruising.  Travel history: No recent long distant travel.  Allergy: No Known Allergies  Past Medical History  Diagnosis Date  . COPD (chronic obstructive pulmonary disease) ([place])   . HOH (hard of hearing)   . CHF (congestive heart failure) (HCC) EF 20 - 25% in 2015  . HLD (hyperlipidemia)     Past Surgical History  Procedure Laterality Date  . Appendectomy    . Left heart catheterization with coronary angiogram N/A 11/07/2013    Procedure: LEFT HEART CATHETERIZATION WITH CORONARY ANGIOGRAM;  Surgeon: Clent Demark, MD;  Location: [facility] CATH LAB;  Service: Cardiovascular;  Laterality: N/A;  . Cardiac stents      No stent history    Social History:  reports that he has been smoking Cigarettes.  He has been smoking about 0.50 packs per day. He has never used smokeless tobacco. He reports that he does not drink alcohol or use illicit drugs.  Family History:  Family History  Problem Relation Age of Onset  . Stroke Mother      Prior to Admission medications   Medication Sig Start Date End Date Taking? Authorizing Provider  albuterol (PROVENTIL) (2.5 MG/3ML) 0.083% nebulizer solution Take 6 mLs (5 mg total) by nebulization every 6 (six) hours as needed for wheezing or shortness of breath. 08/25/15  Yes Larene Pickett, PA-C  aspirin EC 81 MG EC tablet Take 1 tablet (81 mg total) by mouth daily. 03/04/15  Yes  Maryann Mikhail, DO  atorvastatin (LIPITOR) 40 MG tablet Take 1 tablet (40 mg total) by mouth at bedtime. 03/04/15   Maryann Mikhail, DO  azithromycin (ZITHROMAX Z-PAK) 250 MG tablet Take 1 tablet (250 mg total) by mouth daily. Patient not taking: Reported on 10/11/2015 10/02/15   Helane Gunther Joy, PA-C  budesonide-formoterol (SYMBICORT) 160-4.5 MCG/ACT inhaler Inhale 2 puffs into the lungs 2 (two) times daily.  08/13/15   Thurnell Lose, MD  methimazole (TAPAZOLE) 10 MG tablet Take 2 tablets (20 mg total) by mouth 2 (two) times daily. 08/13/15   Thurnell Lose, MD  Multiple Vitamin (MULTIVITAMIN WITH MINERALS) TABS tablet Take 1 tablet by mouth daily. 03/04/15   Maryann Mikhail, DO  predniSONE (DELTASONE) 20 MG tablet Take 2 tablets (40 mg total) by mouth daily. Patient not taking: Reported on 10/11/2015 10/02/15   Shawn C Joy, PA-C  SPIRIVA HANDIHALER 18 MCG inhalation capsule Place 1 capsule (18 mcg total) into inhaler and inhale daily. 08/13/15   Thurnell Lose, MD    Physical Exam: Filed Vitals:   10/11/15 2257 10/12/15 0057 10/12/15 0230 10/12/15 0330  BP:  98/77 100/83 93/65  Pulse:  98  83  Temp:      TempSrc:      Resp:  22 26 16   SpO2: 94% 90%  86%   General: Not in acute distress. HEENT:       Eyes: PERRL, EOMI, no scleral icterus.       ENT: No discharge from the ears and nose, no pharynx injection, no tonsillar enlargement.        Neck: No JVD, no bruit, no mass felt. Heme: No neck lymph node enlargement. Cardiac: S1/S2, RRR, No murmurs, No gallops or rubs. Pulm: Has decreased air movement bilaterally, has mild wheezing, No rales or rubs. Abd: Mildly distended, diffused tenderness, no rebound pain, no organomegaly, BS present. Ext: No pitting leg edema bilaterally. 2+DP/PT pulse bilaterally. Musculoskeletal: No joint deformities, No joint redness or warmth, no limitation of ROM in spin. Skin: No rashes.  Neuro: Alert, oriented X3, cranial nerves II-XII grossly intact, moves all extremities. Psych: Patient is not psychotic, no suicidal or hemocidal ideation.  Labs on Admission:  Basic Metabolic Panel:  Recent Labs Lab 10/12/15 0018  NA 133*  K 4.6  CL 97*  CO2 21*  GLUCOSE 106*  BUN 66*  CREATININE 2.42*  CALCIUM 8.3*   Liver Function Tests: No results for input(s): AST, ALT, ALKPHOS, BILITOT, PROT, ALBUMIN in the last 168 hours. No results for input(s): LIPASE,  AMYLASE in the last 168 hours. No results for input(s): AMMONIA in the last 168 hours. CBC:  Recent Labs Lab 10/12/15 0018 10/12/15 0549  WBC 10.3 10.7*  HGB 15.9 14.5  HCT 46.7 43.6  MCV 89.1 89.5  PLT 160 163   Cardiac Enzymes: No results for input(s): CKTOTAL, CKMB, CKMBINDEX, TROPONINI in the last 168 hours.  BNP (last 3 results)  Recent Labs  04/27/15 0213 08/11/15 1242 09/08/15 0516  BNP 28.1 37.2 61.1    ProBNP (last 3 results) No results for input(s): PROBNP in the last 8760 hours.  CBG: No results for input(s): GLUCAP in the last 168 hours.  Radiological Exams on Admission: Ct Abdomen Pelvis Wo Contrast  10/12/2015  CLINICAL DATA:  Acute onset of generalized abdominal pain and vomiting. Initial encounter. EXAM: CT ABDOMEN AND PELVIS WITHOUT CONTRAST TECHNIQUE: Multidetector CT imaging of the abdomen and pelvis was performed following the standard protocol without IV contrast.  COMPARISON:  CT of the abdomen and pelvis from 05/02/2015 FINDINGS: The visualized lung bases are clear. There is recurrent dilatation of small bowel loops up to 7.0 cm in maximal diameter, with an apparent transition point noted at the lower mid abdomen, and decompressed distal small bowel loops. The colon is still largely filled with stool. This raises concern for small bowel obstruction, most likely secondary to an adhesion. The liver and spleen are unremarkable in appearance. The gallbladder is within normal limits. The pancreas and right adrenal gland are unremarkable. A large 4.1 cm left adrenal adenoma is again noted. The kidneys are unremarkable in appearance. There is no evidence of hydronephrosis. No renal or ureteral stones are seen. No perinephric stranding is appreciated. No free fluid is identified. The small bowel is unremarkable in appearance. The stomach is within normal limits. No acute vascular abnormalities are seen. Mild scattered calcification is noted along the abdominal aorta  and its branches. The patient is status post appendectomy. The bladder is mildly distended and grossly unremarkable. The prostate is borderline enlarged, measuring 4.9 cm in transverse dimension. No inguinal lymphadenopathy is seen. No acute osseous abnormalities are identified. Vacuum phenomenon and disc space narrowing are noted at L5-S1. IMPRESSION: 1. Recurrent dilatation of small bowel loops up to 7.0 cm in maximal diameter, with an apparent transition point at the lower mid abdomen, and decompressed distal small bowel loops. The colon is still largely filled with stool. This is concerning for relatively high-grade small bowel obstruction, most likely secondary to adhesion. 2. Mild scattered calcification along the abdominal aorta and its branches. 3. Large 4.1 cm left adrenal adenoma again noted. 4. Borderline enlarged prostate. Electronically Signed   By: Garald Balding M.D.   On: 10/12/2015 04:18   Dg Chest 2 View  10/11/2015  CLINICAL DATA:  Upper and mid abdominal pain for 3 days, chest pain beginning last night. Poor historian. History of COPD, CHF. EXAM: CHEST  2 VIEW COMPARISON:  Chest radiograph October 02, 2015 FINDINGS: Increased lung volumes with LEFT greater than RIGHT apical lucencies most compatible with bullous changes and COPD with flattened hemidiaphragms. No pleural effusion or focal consolidation. RIGHT midlung zone scarring. Mildly elevated LEFT hemidiaphragm. Cardiomediastinal silhouette is normal. No pneumothorax. Soft tissue planes and included osseous structures are normal. IMPRESSION: COPD, no superimposed acute cardiopulmonary process. Electronically Signed   By: Elon Alas M.D.   On: 10/11/2015 23:51    Assessment/Plan Principal Problem:   SBO (small bowel obstruction) (HCC) Active Problems:   Mass of adrenal gland (HCC)   Tobacco abuse   Hyperthyroidism   COPD exacerbation (HCC)   Chronic systolic congestive heart failure, NYHA class 3 (HCC)   HLD  (hyperlipidemia)   CAD (nonobstructive per cath 2015)   HTN (hypertension)   Abdominal pain   Nausea & vomiting   Chest wall pain   AKI (acute kidney injury) ([place])   SBO: Patient's abdominal pain, nausea and vomiting are due to SBO as evidenced by CT abdomen/pelvis. Patient is hemodynamically stable. Gen. surgery was consulted to the ED.  -will admit to tele bed -Follow up surgeon's recommendation -When necessary Zofran for nausea, morphine for pain -IVF: NS 1L and then 100cc/h - INR/PTT/type & screen -Prn NGT  COPD (chronic obstructive pulmonary disease) ([place]): Patient has decreased air movement and mild wheezing, indicating mild exacerbation. Pt just completed a course of Z-pak recently. -Nebulizers: scheduled Duoneb and prn albuterol -Solu-Medrol 60 mg IV daily  -Mucinex for cough  -Follow up blood  culture x2, sputum culture, Flu pcr  AKI: Likely due to prerenal secondary to dehydration and continuation. - IVF - Check FeNa - Follow up renal function by BMP - Avoid ACEI and NSAIDs  Chronic systolic congestive heart failure, NYHA class 3 ([place]): 2-D echo on 08/12/15 showed EF 50-55%. Patient is not on diuretics at home. No leg edema. CHF is compensated. -Continue aspirin  -check BNP  Tobacco abuse: -Did counseling about importance of quitting smoking -Nicotine patch  Hyperthyroidism: On methimazole. TSH was 0.018, but free T4 and T3 were normal on 08/12/15. -Continue methimazole  HLD: Last LDL was 107 on 08/12/15 -Continue home medications: Lipitor  CAD (nonobstructive per cath 2015): Patient reports having chest tightness. Need to rule out ACS. -Continue aspirin -When necessary morphine for pain -Troponin 3 -Repeated EKG morning  HTN: Not on medications at home. Blood pressure is 98/77. -Observed blood pressure closely  DVT ppx: SCD  Code Status: Full code Family Communication: None at bed side. Disposition Plan: Admit to inpatient   Date of Service  10/12/2015    Ivor Costa Anthony Bolton Pager [phone]  If 7PM-7AM, please contact night-coverage [email or web address] Password [email or web address] 10/12/2015, 6:18 AM

## 2015-10-12 NOTE — Progress Notes (Signed)
[facility] PROGRESS NOTE  ULYSIS NEWLEN [id number] DOB: 09-28-1951 DOA: 10/11/2015 PCP: [name], MD  Brief narrative 65 year old male with history of COPD, hard of hearing, hyperlipidemia, tobacco use, hypothyroidism and coronary artery disease presented to the ED with abdominal pain associated with nausea, vomiting and shortness of breath. Reports abdominal pain for the past 3 days with an episode of vomiting on the day prior to admission. In the ED his initial vitals were stable except for tachycardia. Blood work showed a wbc of 10.3 with acute kidney injury. CT of the abdomen and pelvis showed high-grade small bowel obstruction. Patient admitted for further management. Surgery consulted.  Assessment/Plan: Small bowel obstruction Admit to telemetry. Surgery consult appreciated. Recommend conservative management with pain medications, antiemetic and bowel rest. NG placed on admission but patient pulled out. -Continue serial abdominal exam.  Hypotension Blood pressure persistently in the 80s. Order for 1 L normal saline bolus followed by maintenance fluid. Monitor closely. If persistently hypertensive will admit him to stepdown unit.  COPD Mild wheezing on admission. Continue IV Solu-Medrol and DuoNeb. Blood culture sent on admission. Continue antitussives. Patient recently completed a course of Z-Pak  Acute kidney injury Likely prerenal secondary to dehydration and hypovolemia. Continue aggressive IV fluids. Avoid nephrotoxins.  Chronic systolic CHF Last echo from 11/16 shows EF of 50-55%. Appears hypovolemic. Continue aspirin  Coronary artery disease Report of some chest tightness on admission. Asymptomatic on my evaluation. Serial troponins negative.  Tobacco use Counseled on cessation.   Severe protein calorie malnutrition  Diet: Nothing by mouth  DVT prophylaxis   Code Status: full Code Family Communication: none at bedside Disposition Plan: admit  to tele. Admission to stepdown if persistently hypotensive   Consultants:  [place] [facility]  Procedures:  CT abdomen and pelvis  Antibiotics:  None  HPI/Subjective: Seen and examined. Denies any abdominal pain. Found to be hypotensive  this morning.  Objective: Filed Vitals:   10/12/15 1105 10/12/15 1235  BP: 83/59 77/56  Pulse:  59  Temp:    Resp:  16    Intake/Output Summary (Last 24 hours) at 10/12/15 1247 Last data filed at 10/12/15 0545  Gross per 24 hour  Intake      0 ml  Output     50 ml  Net    -50 ml   There were no vitals filed for this visit.  Exam:   General:  Elderly male lying in bed, hard of hearing, not in distress  HEENT: Dry mucosa, temporal wasting, supple neck  Chest: Clear bilaterally, no added sounds  Cardiovascular: Normal S1 and S2, no murmurs rub or gallop  GI: Soft, nondistended, nontender, bowel sounds present  Musculoskeletal: Warm, no edema  Data Reviewed: Basic Metabolic Panel:  Recent Labs Lab 10/12/15 0018 10/12/15 0549  NA 133* 133*  K 4.6 4.3  CL 97* 101  CO2 21* 21*  GLUCOSE 106* 116*  BUN 66* 59*  CREATININE 2.42* 1.67*  CALCIUM 8.3* 8.4*   Liver Function Tests:  Recent Labs Lab 10/12/15 0549  AST 18  ALT 14*  ALKPHOS 51  BILITOT 0.9  PROT 6.3*  ALBUMIN 3.8    Recent Labs Lab 10/12/15 0549  LIPASE 24   No results for input(s): AMMONIA in the last 168 hours. CBC:  Recent Labs Lab 10/12/15 0018 10/12/15 0549  WBC 10.3 10.7*  HGB 15.9 14.5  HCT 46.7 43.6  MCV 89.1 89.5  PLT 160 163   Cardiac Enzymes:  Recent Labs Lab 10/12/15  [id number] 10/12/15 1039  TROPONINI 0.03 <0.03   BNP (last 3 results)  Recent Labs  08/11/15 1242 09/08/15 0516 10/12/15 0517  BNP 37.2 61.1 16.0    ProBNP (last 3 results) No results for input(s): PROBNP in the last 8760 hours.  CBG:  Recent Labs Lab 10/12/15 0800  GLUCAP 108*    No results found for this or any previous visit (from the past  240 hour(s)).   Studies: Ct Abdomen Pelvis Wo Contrast  10/12/2015  CLINICAL DATA:  Acute onset of generalized abdominal pain and vomiting. Initial encounter. EXAM: CT ABDOMEN AND PELVIS WITHOUT CONTRAST TECHNIQUE: Multidetector CT imaging of the abdomen and pelvis was performed following the standard protocol without IV contrast. COMPARISON:  CT of the abdomen and pelvis from 05/02/2015 FINDINGS: The visualized lung bases are clear. There is recurrent dilatation of small bowel loops up to 7.0 cm in maximal diameter, with an apparent transition point noted at the lower mid abdomen, and decompressed distal small bowel loops. The colon is still largely filled with stool. This raises concern for small bowel obstruction, most likely secondary to an adhesion. The liver and spleen are unremarkable in appearance. The gallbladder is within normal limits. The pancreas and right adrenal gland are unremarkable. A large 4.1 cm left adrenal adenoma is again noted. The kidneys are unremarkable in appearance. There is no evidence of hydronephrosis. No renal or ureteral stones are seen. No perinephric stranding is appreciated. No free fluid is identified. The small bowel is unremarkable in appearance. The stomach is within normal limits. No acute vascular abnormalities are seen. Mild scattered calcification is noted along the abdominal aorta and its branches. The patient is status post appendectomy. The bladder is mildly distended and grossly unremarkable. The prostate is borderline enlarged, measuring 4.9 cm in transverse dimension. No inguinal lymphadenopathy is seen. No acute osseous abnormalities are identified. Vacuum phenomenon and disc space narrowing are noted at L5-S1. IMPRESSION: 1. Recurrent dilatation of small bowel loops up to 7.0 cm in maximal diameter, with an apparent transition point at the lower mid abdomen, and decompressed distal small bowel loops. The colon is still largely filled with stool. This is  concerning for relatively high-grade small bowel obstruction, most likely secondary to adhesion. 2. Mild scattered calcification along the abdominal aorta and its branches. 3. Large 4.1 cm left adrenal adenoma again noted. 4. Borderline enlarged prostate. Electronically Signed   By: Garald Balding M.D.   On: 10/12/2015 04:18   Dg Chest 2 View  10/11/2015  CLINICAL DATA:  Upper and mid abdominal pain for 3 days, chest pain beginning last night. Poor historian. History of COPD, CHF. EXAM: CHEST  2 VIEW COMPARISON:  Chest radiograph October 02, 2015 FINDINGS: Increased lung volumes with LEFT greater than RIGHT apical lucencies most compatible with bullous changes and COPD with flattened hemidiaphragms. No pleural effusion or focal consolidation. RIGHT midlung zone scarring. Mildly elevated LEFT hemidiaphragm. Cardiomediastinal silhouette is normal. No pneumothorax. Soft tissue planes and included osseous structures are normal. IMPRESSION: COPD, no superimposed acute cardiopulmonary process. Electronically Signed   By: Elon Alas M.D.   On: 10/11/2015 23:51    Scheduled Meds: . aspirin  150 mg Rectal Daily  . atorvastatin  40 mg Oral QHS  . dextromethorphan-guaiFENesin  1 tablet Oral BID  . methimazole  20 mg Oral BID  . methylPREDNISolone (SOLU-MEDROL) injection  60 mg Intravenous Daily  . multivitamin with minerals  1 tablet Oral Daily  . sodium chloride  3 mL  Intravenous Q12H   Continuous Infusions: . sodium chloride 100 mL/hr at 10/12/15 0743  . sodium chloride 1,000 mL (10/12/15 1156)      Time spent: [place], [place]  [facility] Pager [phone] If 7PM-7AM, please contact night-coverage at [email or web address], password [facility] 10/12/2015, 12:47 PM  LOS: 0 days

## 2015-10-13 ENCOUNTER — Inpatient Hospital Stay (HOSPITAL_COMMUNITY): Payer: Medicare Other

## 2015-10-13 DIAGNOSIS — Z720 Tobacco use: Secondary | ICD-10-CM

## 2015-10-13 DIAGNOSIS — I959 Hypotension, unspecified: Secondary | ICD-10-CM

## 2015-10-13 LAB — BASIC METABOLIC PANEL
Anion gap: 7 (ref 5–15)
BUN: 37 mg/dL — AB (ref 6–20)
CALCIUM: 8.3 mg/dL — AB (ref 8.9–10.3)
CO2: 19 mmol/L — ABNORMAL LOW (ref 22–32)
CREATININE: 0.65 mg/dL (ref 0.61–1.24)
Chloride: 112 mmol/L — ABNORMAL HIGH (ref 101–111)
GFR calc Af Amer: >60 mL/min (ref >60)
Glucose, Bld: 96 mg/dL (ref 65–99)
POTASSIUM: 4.2 mmol/L (ref 3.5–5.1)
SODIUM: 138 mmol/L (ref 135–145)

## 2015-10-13 LAB — GLUCOSE, CAPILLARY: GLUCOSE-CAPILLARY: 89 mg/dL (ref 65–99)

## 2015-10-13 MED ORDER — PREDNISONE 20 MG PO TABS
40.0000 mg | ORAL_TABLET | Freq: Every day | ORAL | Status: DC
  Administered 2015-10-14: 40 mg via ORAL
  Filled 2015-10-13: qty 2

## 2015-10-13 MED ORDER — METHIMAZOLE 10 MG PO TABS
10.0000 mg | ORAL_TABLET | Freq: Two times a day (BID) | ORAL | Status: DC
  Administered 2015-10-14: 10 mg via ORAL
  Filled 2015-10-13 (×4): qty 1

## 2015-10-13 MED ORDER — ASPIRIN EC 81 MG PO TBEC
81.0000 mg | DELAYED_RELEASE_TABLET | Freq: Every day | ORAL | Status: DC
  Administered 2015-10-13 – 2015-10-14 (×2): 81 mg via ORAL
  Filled 2015-10-13 (×2): qty 1

## 2015-10-13 NOTE — Progress Notes (Signed)
Utilization review completed.  

## 2015-10-13 NOTE — Progress Notes (Addendum)
[facility] PROGRESS NOTE  Anthony Bolton [id number] DOB: 1950/10/29 DOA: 10/11/2015 PCP: [name], MD  Brief narrative 65 year old male with history of COPD, hard of hearing, hyperlipidemia, tobacco use, hypothyroidism and coronary artery disease presented to the ED with abdominal pain associated with nausea, vomiting and shortness of breath. Reports abdominal pain for the past 3 days with an episode of vomiting on the day prior to admission. In the ED his initial vitals were stable except for tachycardia. Blood work showed a wbc of 10.3 with acute kidney injury. CT of the abdomen and pelvis showed high-grade small bowel obstruction. Patient admitted for further management. Surgery consulted. Patient was admitted in August 2016 for small bowel obstruction as well.  Assessment/Plan: Partial Small bowel obstruction -Continue conservative management. Follow-up abdominal x-ray this morning shows resolution of obstruction. Started on clears. Has hostile burden as well and will add bowel regimen. Appreciate surgery consult  Hypotension No clear etiology and no signs of sepsis. Had systolic blood pressure persistently in the 80s yesterday and received almost 4 L having also an bolus followed by maintenance fluid. This morning it is in the 90s with MAP>60. Asymptomatic. Continue to monitor with maintenance fluid. Cortisol was normal. TSH is persistently suppressed. I would reduce his methimazole 10 mg twice daily.  COPD Mild wheezing on admission. Addition to oral prednisone. Continue DuoNeb.. Blood culture sent on admission. Continue antitussives. Patient recently completed a course of Z-Pak  Acute kidney injury Likely prerenal secondary to dehydration and hypovolemia. Resolved with fluids.  Chronic systolic CHF Last echo from 11/16 shows EF of 50-55%. Appears hypovolemic. Continue aspirin  Coronary artery disease Continue home medications.  Tobacco use Counseled on  cessation.   Severe protein calorie malnutrition  Diet: Nothing by mouth  DVT prophylaxis   Code Status: full Code Family Communication: none at bedside Disposition Plan:Transfer to telemetry if blood pressures remain stable during the day.  Consultants:  [place] [facility]  Procedures:  CT abdomen and pelvis  Antibiotics:  None  HPI/Subjective: Seen and examined. Denies any abdominal pain.Was very agitated that he was not allowed to eat anything.   Objective: Filed Vitals:   10/13/15 0900 10/13/15 1000  BP: 94/62 93/58  Pulse: 59 55  Temp:    Resp: 15 23    Intake/Output Summary (Last 24 hours) at 10/13/15 1153 Last data filed at 10/13/15 1000  Gross per 24 hour  Intake 3558.33 ml  Output      0 ml  Net 3558.33 ml   Filed Weights   10/12/15 1635  Weight: 51.7 kg (113 lb 15.7 oz)    Exam:   General:  Elderly male lying in bed, hard of hearing, not in distress  HEENT: Dry mucosa, temporal wasting, supple neck  Chest: Clear bilaterally, no added sounds  Cardiovascular: Normal S1 and S2, no murmurs rub or gallop  GI: Soft, nondistended, nontender, bowel sounds present  Musculoskeletal: Warm, no edema  Data Reviewed: Basic Metabolic Panel:  Recent Labs Lab 10/12/15 0018 10/12/15 0549 10/13/15 0413  NA 133* 133* 138  K 4.6 4.3 4.2  CL 97* 101 112*  CO2 21* 21* 19*  GLUCOSE 106* 116* 96  BUN 66* 59* 37*  CREATININE 2.42* 1.67* 0.65  CALCIUM 8.3* 8.4* 8.3*   Liver Function Tests:  Recent Labs Lab 10/12/15 0549  AST 18  ALT 14*  ALKPHOS 51  BILITOT 0.9  PROT 6.3*  ALBUMIN 3.8    Recent Labs Lab 10/12/15 0549  LIPASE 24  No results for input(s): AMMONIA in the last 168 hours. CBC:  Recent Labs Lab 10/12/15 0018 10/12/15 0549  WBC 10.3 10.7*  HGB 15.9 14.5  HCT 46.7 43.6  MCV 89.1 89.5  PLT 160 163   Cardiac Enzymes:  Recent Labs Lab 10/12/15 0514 10/12/15 1039 10/12/15 1644  TROPONINI 0.03 <0.03 <0.03    BNP (last 3 results)  Recent Labs  08/11/15 1242 09/08/15 0516 10/12/15 0517  BNP 37.2 61.1 16.0    ProBNP (last 3 results) No results for input(s): PROBNP in the last 8760 hours.  CBG:  Recent Labs Lab 10/12/15 0800 10/13/15 0748  GLUCAP 108* 89    Recent Results (from the past 240 hour(s))  MRSA PCR Screening     Status: Abnormal   Collection Time: 10/12/15  4:42 PM  Result Value Ref Range Status   MRSA by PCR POSITIVE (A) NEGATIVE Final    Comment:        The GeneXpert MRSA Assay (FDA approved for NASAL specimens only), is one component of a comprehensive MRSA colonization surveillance program. It is not intended to diagnose MRSA infection nor to guide or monitor treatment for MRSA infections. RESULT CALLED TO, READ BACK BY AND VERIFIED WITH: GARLAND,G RN AT 1952 ON 1.14.17 BY EPPERSON,S      Studies: Ct Abdomen Pelvis Wo Contrast  10/12/2015  CLINICAL DATA:  Acute onset of generalized abdominal pain and vomiting. Initial encounter. EXAM: CT ABDOMEN AND PELVIS WITHOUT CONTRAST TECHNIQUE: Multidetector CT imaging of the abdomen and pelvis was performed following the standard protocol without IV contrast. COMPARISON:  CT of the abdomen and pelvis from 05/02/2015 FINDINGS: The visualized lung bases are clear. There is recurrent dilatation of small bowel loops up to 7.0 cm in maximal diameter, with an apparent transition point noted at the lower mid abdomen, and decompressed distal small bowel loops. The colon is still largely filled with stool. This raises concern for small bowel obstruction, most likely secondary to an adhesion. The liver and spleen are unremarkable in appearance. The gallbladder is within normal limits. The pancreas and right adrenal gland are unremarkable. A large 4.1 cm left adrenal adenoma is again noted. The kidneys are unremarkable in appearance. There is no evidence of hydronephrosis. No renal or ureteral stones are seen. No perinephric  stranding is appreciated. No free fluid is identified. The small bowel is unremarkable in appearance. The stomach is within normal limits. No acute vascular abnormalities are seen. Mild scattered calcification is noted along the abdominal aorta and its branches. The patient is status post appendectomy. The bladder is mildly distended and grossly unremarkable. The prostate is borderline enlarged, measuring 4.9 cm in transverse dimension. No inguinal lymphadenopathy is seen. No acute osseous abnormalities are identified. Vacuum phenomenon and disc space narrowing are noted at L5-S1. IMPRESSION: 1. Recurrent dilatation of small bowel loops up to 7.0 cm in maximal diameter, with an apparent transition point at the lower mid abdomen, and decompressed distal small bowel loops. The colon is still largely filled with stool. This is concerning for relatively high-grade small bowel obstruction, most likely secondary to adhesion. 2. Mild scattered calcification along the abdominal aorta and its branches. 3. Large 4.1 cm left adrenal adenoma again noted. 4. Borderline enlarged prostate. Electronically Signed   By: Garald Balding M.D.   On: 10/12/2015 04:18   Dg Chest 2 View  10/11/2015  CLINICAL DATA:  Upper and mid abdominal pain for 3 days, chest pain beginning last night. Poor historian. History of  COPD, CHF. EXAM: CHEST  2 VIEW COMPARISON:  Chest radiograph October 02, 2015 FINDINGS: Increased lung volumes with LEFT greater than RIGHT apical lucencies most compatible with bullous changes and COPD with flattened hemidiaphragms. No pleural effusion or focal consolidation. RIGHT midlung zone scarring. Mildly elevated LEFT hemidiaphragm. Cardiomediastinal silhouette is normal. No pneumothorax. Soft tissue planes and included osseous structures are normal. IMPRESSION: COPD, no superimposed acute cardiopulmonary process. Electronically Signed   By: Elon Alas M.D.   On: 10/11/2015 23:51   Dg Abd Portable  1v  10/13/2015  CLINICAL DATA:  Abdominal pain and distention EXAM: PORTABLE ABDOMEN - 1 VIEW COMPARISON:  05/08/2015 FINDINGS: There is no obvious free intraperitoneal gas. Prominent stool burden throughout the colon. No disproportionate dilatation of small bowel. No pneumatosis. IMPRESSION: No evidence of small-bowel obstruction. Prominent stool burden throughout the colon. Electronically Signed   By: Marybelle Killings M.D.   On: 10/13/2015 08:33    Scheduled Meds: . antiseptic oral rinse  7 mL Mouth Rinse q12n4p  . aspirin  150 mg Rectal Daily  . atorvastatin  40 mg Oral QHS  . chlorhexidine  15 mL Mouth Rinse BID  . Chlorhexidine Gluconate Cloth  6 each Topical Q0600  . dextromethorphan-guaiFENesin  1 tablet Oral BID  . methimazole  20 mg Oral BID  . methylPREDNISolone (SOLU-MEDROL) injection  60 mg Intravenous Daily  . multivitamin with minerals  1 tablet Oral Daily  . mupirocin ointment  1 application Nasal BID  . sodium chloride  3 mL Intravenous Q12H   Continuous Infusions: . sodium chloride 100 mL/hr at 10/12/15 1645      Time spent: [place], [place] [facility] Pager [phone] If 7PM-7AM, please contact night-coverage at [email or web address], password [facility] 10/13/2015, 11:53 AM  LOS: 1 day

## 2015-10-13 NOTE — Evaluation (Addendum)
Physical Therapy Evaluation Patient Details Name: Anthony Bolton MRN: [id number] DOB: 01-17-1951 Today's Date: 10/13/2015   History of Present Illness  65 year old male with history of COPD, hard of hearing, hyperlipidemia, tobacco use, hypothyroidism and coronary artery disease presented to the ED with abdominal pain associated with nausea, vomiting and shortness of breath  Clinical Impression  Pt admitted with above diagnosis. Pt currently with functional limitations due to the deficits listed below (see PT Problem List). Pt will benefit from skilled PT to increase their independence and safety with mobility to allow discharge to the venue listed below.   BP 93/58, asymptomatic, HR in 80s   Pt cooperative with PT but gait distance limited d/t tray arriving and pt very anxious to eat; will follow and continue to assess for D/C needs     Follow Up Recommendations Home health PT;No PT follow up (depending on progress)    Equipment Recommendations  None recommended by PT    Recommendations for Other Services       Precautions / Restrictions Restrictions Weight Bearing Restrictions: No      Mobility  Bed Mobility Overal bed mobility: Needs Assistance Bed Mobility: Supine to Sit     Supine to sit: Supervision     General bed mobility comments: for safety  Transfers Overall transfer level: Needs assistance Equipment used: None Transfers: Sit to/from Stand Sit to Stand: Min guard         General transfer comment: for safety and line management  Ambulation/Gait Ambulation/Gait assistance: Min guard Ambulation Distance (Feet): 8 Feet (in room) Assistive device: None Gait Pattern/deviations: Step-through pattern     General Gait Details: min/guard for safety and balance, management of lines;  pt food tray arrived and he was very anxious to eat, therefore unable to amb further  Stairs            Wheelchair Mobility    Modified Rankin (Stroke Patients Only)        Balance Overall balance assessment: Needs assistance   Sitting balance-Leahy Scale: Good       Standing balance-Leahy Scale: Fair                               Pertinent Vitals/Pain Pain Assessment: No/denies pain    Home Living Family/patient expects to be discharged to:: Unsure                      Prior Function           Comments: pt guarded when asked about home situation and PLOF; difficult to get info d/t pt being very HOH as well     Hand Dominance   Dominant Hand:  (pt uses L hand to eat, chart states right handed)    Extremity/Trunk Assessment   Upper Extremity Assessment: Defer to OT evaluation           Lower Extremity Assessment: Overall WFL for tasks assessed         Communication   Communication: HOH  Cognition Arousal/Alertness: Awake/alert Behavior During Therapy: Impulsive Overall Cognitive Status: Difficult to assess                      General Comments      Exercises        Assessment/Plan    PT Assessment Patient needs continued PT services  PT Diagnosis Difficulty walking   PT Problem List Decreased  mobility;Decreased balance;Decreased strength;Decreased activity tolerance;Decreased safety awareness  PT Treatment Interventions DME instruction;Gait training;Functional mobility training;Therapeutic activities;Patient/family education;Therapeutic exercise   PT Goals (Current goals can be found in the Care Plan section) Acute Rehab PT Goals Patient Stated Goal: none stated PT Goal Formulation: With patient Time For Goal Achievement: 10/20/15 Potential to Achieve Goals: Good    Frequency Min 3X/week   Barriers to discharge        Co-evaluation               End of Session   Activity Tolerance: Patient tolerated treatment well Patient left: in chair;with call bell/phone within reach Nurse Communication: Mobility status         Time: [id number] PT Time Calculation (min)  (ACUTE ONLY): 22 min   Charges:   PT Evaluation $PT Eval Low Complexity: 1 Procedure     PT G Codes:        [place] 10/14/2015, 1:12 PM

## 2015-10-13 NOTE — Progress Notes (Signed)
Subjective: No complaints. Wants food  Objective: Vital signs in last 24 hours: Temp:  [97.2 F (36.2 C)-97.8 F (36.6 C)] 97.8 F (36.6 C) (01/15 0400) Pulse Rate:  [54-69] 55 (01/15 1000) Resp:  [14-23] 23 (01/15 1000) BP: (77-112)/(52-83) 93/58 mmHg (01/15 1000) SpO2:  [90 %-100 %] 97 % (01/15 1000) Weight:  [51.7 kg (113 lb 15.7 oz)] 51.7 kg (113 lb 15.7 oz) (01/14 1635) Last BM Date: 10/12/15  Intake/Output from previous day: 01/14 0701 - 01/15 0700 In: 3258.3 [I.V.:2258.3; IV Piggyback:1000] Out: -  Intake/Output this shift: Total I/O In: 300 [I.V.:300] Out: -   Resp: clear to auscultation bilaterally Cardio: regular rate and rhythm GI: soft, nontender. xray shows contrast in colon  Lab Results:   Recent Labs  10/12/15 0018 10/12/15 0549  WBC 10.3 10.7*  HGB 15.9 14.5  HCT 46.7 43.6  PLT 160 163   BMET  Recent Labs  10/12/15 0549 10/13/15 0413  NA 133* 138  K 4.3 4.2  CL 101 112*  CO2 21* 19*  GLUCOSE 116* 96  BUN 59* 37*  CREATININE 1.67* 0.65  CALCIUM 8.4* 8.3*   PT/INR  Recent Labs  10/12/15 0514  LABPROT 14.0  INR 1.06   ABG No results for input(s): PHART, HCO3 in the last 72 hours.  Invalid input(s): PCO2, PO2  Studies/Results: Ct Abdomen Pelvis Wo Contrast  10/12/2015  CLINICAL DATA:  Acute onset of generalized abdominal pain and vomiting. Initial encounter. EXAM: CT ABDOMEN AND PELVIS WITHOUT CONTRAST TECHNIQUE: Multidetector CT imaging of the abdomen and pelvis was performed following the standard protocol without IV contrast. COMPARISON:  CT of the abdomen and pelvis from 05/02/2015 FINDINGS: The visualized lung bases are clear. There is recurrent dilatation of small bowel loops up to 7.0 cm in maximal diameter, with an apparent transition point noted at the lower mid abdomen, and decompressed distal small bowel loops. The colon is still largely filled with stool. This raises concern for small bowel obstruction, most likely  secondary to an adhesion. The liver and spleen are unremarkable in appearance. The gallbladder is within normal limits. The pancreas and right adrenal gland are unremarkable. A large 4.1 cm left adrenal adenoma is again noted. The kidneys are unremarkable in appearance. There is no evidence of hydronephrosis. No renal or ureteral stones are seen. No perinephric stranding is appreciated. No free fluid is identified. The small bowel is unremarkable in appearance. The stomach is within normal limits. No acute vascular abnormalities are seen. Mild scattered calcification is noted along the abdominal aorta and its branches. The patient is status post appendectomy. The bladder is mildly distended and grossly unremarkable. The prostate is borderline enlarged, measuring 4.9 cm in transverse dimension. No inguinal lymphadenopathy is seen. No acute osseous abnormalities are identified. Vacuum phenomenon and disc space narrowing are noted at L5-S1. IMPRESSION: 1. Recurrent dilatation of small bowel loops up to 7.0 cm in maximal diameter, with an apparent transition point at the lower mid abdomen, and decompressed distal small bowel loops. The colon is still largely filled with stool. This is concerning for relatively high-grade small bowel obstruction, most likely secondary to adhesion. 2. Mild scattered calcification along the abdominal aorta and its branches. 3. Large 4.1 cm left adrenal adenoma again noted. 4. Borderline enlarged prostate. Electronically Signed   By: Garald Balding M.D.   On: 10/12/2015 04:18   Dg Chest 2 View  10/11/2015  CLINICAL DATA:  Upper and mid abdominal pain for 3 days, chest pain beginning  last night. Poor historian. History of COPD, CHF. EXAM: CHEST  2 VIEW COMPARISON:  Chest radiograph October 02, 2015 FINDINGS: Increased lung volumes with LEFT greater than RIGHT apical lucencies most compatible with bullous changes and COPD with flattened hemidiaphragms. No pleural effusion or focal  consolidation. RIGHT midlung zone scarring. Mildly elevated LEFT hemidiaphragm. Cardiomediastinal silhouette is normal. No pneumothorax. Soft tissue planes and included osseous structures are normal. IMPRESSION: COPD, no superimposed acute cardiopulmonary process. Electronically Signed   By: Elon Alas M.D.   On: 10/11/2015 23:51   Dg Abd Portable 1v  10/13/2015  CLINICAL DATA:  Abdominal pain and distention EXAM: PORTABLE ABDOMEN - 1 VIEW COMPARISON:  05/08/2015 FINDINGS: There is no obvious free intraperitoneal gas. Prominent stool burden throughout the colon. No disproportionate dilatation of small bowel. No pneumatosis. IMPRESSION: No evidence of small-bowel obstruction. Prominent stool burden throughout the colon. Electronically Signed   By: Marybelle Killings M.D.   On: 10/13/2015 08:33    Anti-infectives: Anti-infectives    None      Assessment/Plan: s/p * No surgery found * Advance diet. Would offer clears today OOB  LOS: 1 day    TOTH III,PAUL S 10/13/2015

## 2015-10-14 DIAGNOSIS — E440 Moderate protein-calorie malnutrition: Secondary | ICD-10-CM

## 2015-10-14 DIAGNOSIS — R1084 Generalized abdominal pain: Secondary | ICD-10-CM

## 2015-10-14 DIAGNOSIS — N179 Acute kidney failure, unspecified: Secondary | ICD-10-CM

## 2015-10-14 LAB — GLUCOSE, CAPILLARY: GLUCOSE-CAPILLARY: 88 mg/dL (ref 65–99)

## 2015-10-14 MED ORDER — METHIMAZOLE 10 MG PO TABS: 10.0000 mg | ORAL_TABLET | Freq: Two times a day (BID) | ORAL | Status: DC

## 2015-10-14 MED ORDER — BISACODYL 10 MG RE SUPP
10.0000 mg | Freq: Once | RECTAL | Status: AC
  Administered 2015-10-14: 10 mg via RECTAL
  Filled 2015-10-14: qty 1

## 2015-10-14 MED ORDER — POLYETHYLENE GLYCOL 3350 17 G PO PACK: 17.0000 g | PACK | Freq: Every day | ORAL | Status: DC | PRN

## 2015-10-14 NOTE — Evaluation (Signed)
Occupational Therapy Evaluation Patient Details Name: Anthony Bolton MRN: [id number] DOB: 05-08-1951 Today's Date: 10/14/2015    History of Present Illness 65 year old male with history of COPD, hard of hearing, hyperlipidemia, tobacco use, hypothyroidism and coronary artery disease presented to the ED with abdominal pain associated with nausea, vomiting and shortness of breath   Clinical Impression   Patient presenting with decreased ADL and functional mobility independence secondary to above. Patient independent and homeless PTA. Patient currently functioning at an overall supervision level. Patient will benefit from acute OT to increase overall independence in the areas of ADLs, functional mobility, and overall safety in order to safely discharge back to motel.   Pt with discharge orders in chart. This OT talked with RN and patient's CM. Plan is for patient to discharge to a motel due to being homeless. At this time, think it would be ideal if patient stayed an extra night to regain his overall strength and independence. As stated above, he is supervision for mobility and patient is somewhat unsteady on feet. IF pt discharges today, recommend someone stay with him at least overnight. While still in the hospital, pt will benefit from walking in hallway with nursing staff to help increase overall safety, strength, and independence. Discussed this with patient's RN.    Follow Up Recommendations  No OT follow up;Supervision - Intermittent    Equipment Recommendations  None recommended by OT    Recommendations for Other Services  None at this time    Precautions / Restrictions Precautions Precautions: Fall Restrictions Weight Bearing Restrictions: No    Mobility Bed Mobility Overal bed mobility: Modified Independent  Transfers Overall transfer level: Needs assistance Equipment used: None Transfers: Sit to/from Stand Sit to Stand: Supervision General transfer comment: supervision  for safety    Balance Overall balance assessment: Needs assistance Sitting-balance support: No upper extremity supported;Feet supported Sitting balance-Leahy Scale: Good     Standing balance support: No upper extremity supported;During functional activity Standing balance-Leahy Scale: Fair Standing balance comment: some minor LOB, but pt able to self-correct    ADL Overall ADL's : Needs assistance/impaired General ADL Comments: Pt overall supervision for ADLs and functional mobility. Upon initial stand from bed, pt with minor LOB, but able to self correct. During initial stand, pt made comment "Oh, I'm weak".     Vision Additional Comments: difficult to fully asses secodnary to [facility]          Pertinent Vitals/Pain Pain Assessment: No/denies pain     Hand Dominance     Extremity/Trunk Assessment Upper Extremity Assessment Upper Extremity Assessment: Generalized weakness   Lower Extremity Assessment Lower Extremity Assessment: Defer to PT evaluation   Cervical / Trunk Assessment Cervical / Trunk Assessment: Normal   Communication Communication Communication: HOH   Cognition Arousal/Alertness: Awake/alert Behavior During Therapy: WFL for tasks assessed/performed Overall Cognitive Status: Difficult to assess              Home Living Family/patient expects to be discharged to:: Shelter/Homeless Additional Comments: Pt reports he will be discharging to a motel      Prior Functioning/Environment Comments: pt guarded when asked about home situation and PLOF; difficult to get info d/t pt being very HOH as well    OT Diagnosis: Generalized weakness   OT Problem List: Decreased strength;Decreased activity tolerance;Impaired balance (sitting and/or standing);Decreased safety awareness;Decreased knowledge of use of DME or AE   OT Treatment/Interventions: Self-care/ADL training;Therapeutic exercise;Energy conservation;DME and/or AE instruction;Therapeutic  activities;Patient/family education;Balance training    OT  Goals(Current goals can be found in the care plan section) Acute Rehab OT Goals Patient Stated Goal: none stated OT Goal Formulation: With patient Time For Goal Achievement: 10/28/15 Potential to Achieve Goals: Good ADL Goals Pt Will Perform Grooming: with modified independence;standing Pt Will Perform Lower Body Bathing: with modified independence;sit to/from stand Pt Will Perform Lower Body Dressing: with modified independence;sit to/from stand Pt Will Transfer to Toilet: with modified independence;ambulating;regular height toilet Pt Will Perform Tub/Shower Transfer: Shower transfer;ambulating;with modified independence Additional ADL Goal #1: Pt will be mod I with functional mobility using no AD  OT Frequency: Min 2X/week   Barriers to D/C: Decreased caregiver support   End of Session Nurse Communication: Mobility status;Other (comment) (d/c planning)  Activity Tolerance: Patient tolerated treatment well Patient left: in chair;with call bell/phone within reach;with chair alarm set   Time: [id number] OT Time Calculation (min): 14 min Charges:  OT General Charges $OT Visit: 1 Procedure OT Evaluation $OT Eval Low Complexity: 1 Procedure  Chrys Racer , MS, OTR/L, CLT Pager: [phone]  10/14/2015, 2:36 PM

## 2015-10-14 NOTE — Discharge Instructions (Signed)
Small Bowel Obstruction °A small bowel obstruction means that something is blocking the small bowel. The small bowel is also called the small intestine. It is the long tube that connects the stomach to the colon. An obstruction will stop food and fluids from passing through the small bowel. Treatment depends on what is causing the problem and how bad the problem is. °HOME CARE °· Get a lot of rest. °· Follow your diet as told by your doctor. You may need to: °¨ Only drink clear liquids until you start to get better. °¨ Avoid solid foods as told by your doctor. °· Take over-the-counter and prescription medicines only as told by your doctor. °· Keep all follow-up visits as told by your doctor. This is important. °GET HELP IF: °· You have a fever. °· You have chills. °GET HELP RIGHT AWAY IF: °· You have pain or cramps that get worse. °· You throw up (vomit) blood. °· You have a feeling of being sick to your stomach (nausea) that does not go away. °· You cannot stop throwing up. °· You cannot drink fluids. °· You feel confused. °· You feel dry or thirsty (dehydrated). °· Your belly gets more bloated. °· You feel weak or you pass out (faint). °  °This information is not intended to replace advice given to you by your health care provider. Make sure you discuss any questions you have with your health care provider. °  °Document Released: 10/22/2004 Document Revised: 06/05/2015 Document Reviewed: 11/08/2014 °Elsevier Interactive Patient Education ©2016 [employer] ° °

## 2015-10-14 NOTE — Progress Notes (Signed)
Patient ID: Anthony Bolton, male   DOB: 12-06-1950, 65 y.o.   MRN: [id number]     [place]      [address] [place], [place] [address]    Phone: [phone] FAX: [phone]     Subjective: [facility] BM yesterday. No n/v. Tolerating clears.  Objective:  Vital signs:  Filed Vitals:   10/13/15 1611 10/13/15 1628 10/13/15 1857 10/14/15 0506  BP: 118/86  124/80 120/78  Pulse: 68  63 56  Temp:   97.8 F (36.6 C) 97.5 F (36.4 C)  TempSrc:   Oral Oral  Resp: 18   18  Height:   5' 9"  (1.753 m)   Weight:   54.8 kg (120 lb 13 oz)   SpO2: 97% 97% 98% 98%    Last BM Date: 10/12/15  Intake/Output   Yesterday:  01/15 0701 - 01/16 0700 In: 6761 [P.O.:1425; I.V.:2400] Out: -  This shift:      Physical Exam: General: Pt awake/alert/oriented x4 in no  acute distress Abdomen: Soft.  Nondistended.   Non tender.  No evidence of peritonitis.  No incarcerated hernias.   Problem List:   Principal Problem:   SBO (small bowel obstruction) (HCC) Active Problems:   Mass of adrenal gland (HCC)   Tobacco abuse   Hyperthyroidism   COPD exacerbation (HCC)   Chronic systolic congestive heart failure, NYHA class 3 (HCC)   HLD (hyperlipidemia)   Small bowel obstruction (HCC)   CAD (nonobstructive per cath 2015)   HTN (hypertension)   Abdominal pain   Nausea & vomiting   Chest wall pain   AKI (acute kidney injury) ([place])    Results:   Labs: Results for orders placed or performed during the hospital encounter of 10/11/15 (from the past 48 hour(s))  Troponin I (q 6hr x 3)     Status: None   Collection Time: 10/12/15 10:39 AM  Result Value Ref Range   Troponin I <0.03 <0.031 ng/mL    Comment:        NO INDICATION OF MYOCARDIAL INJURY.   Lactic acid, plasma     Status: None   Collection Time: 10/12/15  1:16 PM  Result Value Ref Range   Lactic Acid, Venous 1.2 0.5 - 2.0 mmol/L  MRSA PCR Screening     Status: Abnormal   Collection Time: 10/12/15  4:42 PM  Result Value Ref Range   MRSA by PCR POSITIVE (A) NEGATIVE    Comment:        The GeneXpert MRSA Assay (FDA approved for NASAL specimens only), is one component of a comprehensive MRSA colonization surveillance program. It is not intended to diagnose MRSA infection nor to guide or monitor treatment for MRSA infections. RESULT CALLED TO, READ BACK BY AND VERIFIED WITH: GARLAND,G RN AT 1952 ON 1.14.17 BY EPPERSON,S   Troponin I (q 6hr x 3)     Status: None   Collection Time: 10/12/15  4:44 PM  Result Value Ref Range   Troponin I <0.03 <0.031 ng/mL    Comment:        NO INDICATION OF MYOCARDIAL INJURY.   TSH     Status: Abnormal   Collection Time: 10/12/15  4:44 PM  Result Value Ref Range   TSH 0.070 (L) 0.350 - 4.500 uIU/mL  Cortisol     Status: None   Collection Time: 10/12/15  4:44 PM  Result Value Ref Range   Cortisol, Plasma 7.4 ug/dL  Comment: (NOTE) AM    6.7 - 22.6 ug/dL PM   <10.0       ug/dL Performed at [facility]   Lactic acid, plasma     Status: None   Collection Time: 10/12/15  4:44 PM  Result Value Ref Range   Lactic Acid, Venous 1.9 0.5 - 2.0 mmol/L  Basic metabolic panel     Status: Abnormal   Collection Time: 10/13/15  4:13 AM  Result Value Ref Range   Sodium 138 135 - 145 mmol/L   Potassium 4.2 3.5 - 5.1 mmol/L   Chloride 112 (H) 101 - 111 mmol/L   CO2 19 (L) 22 - 32 mmol/L   Glucose, Bld 96 65 - 99 mg/dL   BUN 37 (H) 6 - 20 mg/dL   Creatinine, Ser 0.65 0.61 - 1.24 mg/dL    Comment: RESULT REPEATED AND VERIFIED DELTA CHECK NOTED    Calcium 8.3 (L) 8.9 - 10.3 mg/dL   GFR calc non Af Amer >60 >60 mL/min   GFR calc Af Amer >60 >60 mL/min    Comment: (NOTE) The eGFR has been calculated using the CKD EPI equation. This calculation has not been validated in all clinical situations. eGFR's persistently <60 mL/min signify possible Chronic Kidney Disease.    Anion gap 7 5 - 15  Glucose, capillary      Status: None   Collection Time: 10/13/15  7:48 AM  Result Value Ref Range   Glucose-Capillary 89 65 - 99 mg/dL   Comment 1 Notify RN    Comment 2 Document in Chart   Glucose, capillary     Status: None   Collection Time: 10/14/15  7:35 AM  Result Value Ref Range   Glucose-Capillary 88 65 - 99 mg/dL    Imaging / Studies: Dg Abd Portable 1v  10/13/2015  CLINICAL DATA:  Abdominal pain and distention EXAM: PORTABLE ABDOMEN - 1 VIEW COMPARISON:  05/08/2015 FINDINGS: There is no obvious free intraperitoneal gas. Prominent stool burden throughout the colon. No disproportionate dilatation of small bowel. No pneumatosis. IMPRESSION: No evidence of small-bowel obstruction. Prominent stool burden throughout the colon. Electronically Signed   By: Marybelle Killings M.D.   On: 10/13/2015 08:33    Medications / Allergies:  Scheduled Meds: . antiseptic oral rinse  7 mL Mouth Rinse q12n4p  . aspirin EC  81 mg Oral Daily  . atorvastatin  40 mg Oral QHS  . bisacodyl  10 mg Rectal Once  . chlorhexidine  15 mL Mouth Rinse BID  . Chlorhexidine Gluconate Cloth  6 each Topical Q0600  . dextromethorphan-guaiFENesin  1 tablet Oral BID  . methimazole  10 mg Oral BID  . multivitamin with minerals  1 tablet Oral Daily  . mupirocin ointment  1 application Nasal BID  . predniSONE  40 mg Oral Q breakfast  . sodium chloride  3 mL Intravenous Q12H   Continuous Infusions:  PRN Meds:.acetaminophen **OR** acetaminophen, albuterol, ipratropium-albuterol, morphine injection, ondansetron **OR** ondansetron (ZOFRAN) IV, sodium chloride  Antibiotics: Anti-infectives    None        Assessment/Plan SBO-clinically and radiologically resolved.  Advance diet as tolerated. Moderate stool burden on AXR, give dulcoalx suppository.    Erby Pian, [facility] Pager [phone]([phone])   10/14/2015 8:48 AM

## 2015-10-14 NOTE — Progress Notes (Signed)
Spoke with Dr. Clementeen Graham.  Okay with proceding to discharge to home.  Pt does not have family or friends who can stay with him tonight.  Will obtain cab voucher from social work.  NT will walk pt around unit to check for stability.  Iantha Fallen RN 4:34 PM 10/14/2015

## 2015-10-14 NOTE — Progress Notes (Signed)
Pt discharge to motel home via cab voucher.  Pt had prescription in hand and discharge instructions.  RN discussed discharge instructions with patient.  No questions at this time.  Pt able to walk hall and in room by self.  Vitals stable.  Denies pain.  No further concerns at this time.  Iantha Fallen RN 4:36 PM 10/14/2015

## 2015-10-14 NOTE — Discharge Summary (Signed)
Physician Discharge Summary  Anthony Bolton [id number] DOB: [birth year] DOA: 10/11/2015  PCP: [name], MD  Admit date: 10/11/2015 Discharge date: 10/14/2015  Time spent: 30 minutes  Recommendations for Outpatient Follow-up:  1. Discharge home with outpatient PCP follow   Discharge Diagnoses:  Principal Problem:   SBO (small bowel obstruction) ([place])   Active Problems:   Hypotension   Mass of adrenal gland (HCC)   Tobacco abuse   Hyperthyroidism   COPD exacerbation (HCC)   Chronic systolic congestive heart failure, NYHA class 3 (HCC)   Malnutrition of moderate degree (HCC)   HLD (hyperlipidemia)   CAD (nonobstructive per cath 2015)   Abdominal pain   Nausea & vomiting   AKI (acute kidney injury) ([place])   Discharge Condition: Fair  Diet recommendation: Soft  CODE STATUS: Full code   [facility] Weights   10/12/15 1635 10/13/15 1857  Weight: 51.7 kg (113 lb 15.7 oz) 54.8 kg (120 lb 13 oz)    History of present illness:  Please refer to admission H&P for details, in brief, 65 year old male with history of COPD, hard of hearing, hyperlipidemia, tobacco use, hypothyroidism and coronary artery disease presented to the ED with abdominal pain associated with nausea, vomiting and shortness of breath. Reports abdominal pain for the past 3 days with an episode of vomiting on the day prior to admission. In the ED his initial vitals were stable except for tachycardia. Blood work showed a wbc of 10.3 with acute kidney injury. CT of the abdomen and pelvis showed high-grade small bowel obstruction. Patient admitted for further management. Surgery consulted. Patient was admitted in August 2016 for small bowel obstruction as well.  Hospital Course:  Partial Small bowel obstruction -Monitored with bowel rest, nothing by mouth and serial abdominal exam. Follow-up abdominal x-ray this  shows resolution of obstruction. Tolerating advanced diet. Also has significant constipation and was  given Dulcolax up was to be today. Will discharge him on oral MiraLAX. Follow-up with PCP.  Hypotension Possibly dehydration with his bowel obstruction. Required aggressive normal saline bolus upon admission given his systolic blood pressure in the 80s. Blood pressure has now been stable. He is not on any blood pressure medications.  COPD Mild wheezing on admission. Was given oral prednisone and nebs. Has been stable for the past 2 days. Does not need further treatment besides his home medications.  Acute kidney injury Likely prerenal secondary to dehydration and hypovolemia. Resolved with fluids.  Hypothyroidism TSH persistently low in previous labs. I have reduced his Tapazole dose to 10 mg twice a day. Follows with Dr. Loanne Drilling.  Chronic systolic CHF Last echo from 11/16 shows EF of 50-55%. Euvolemic on presentation requiring aggressive IV fluids. Continue aspirin  Coronary artery disease Continue home medications.  Tobacco use Counseled on cessation.    protein calorie malnutrition ( mild to moderate ) Have consulted dietitian prior to discharge  Stable to be discharged home with outpatient PCP follow-up.    Family Communication: none at bedside Disposition Plan: Home with outpatient follow-up  Consultants:  [place] [facility]  Procedures:  CT abdomen and pelvis  Antibiotics:  None  Discharge Exam: Filed Vitals:   10/13/15 1857 10/14/15 0506  BP: 124/80 120/78  Pulse: 63 56  Temp: 97.8 F (36.6 C) 97.5 F (36.4 C)  Resp:  18     General: Elderly male lying in bed, hard of hearing, not in distress  HEENT: Moist mucosa, temporal wasting, supple neck  Chest: Clear bilaterally, no added sounds  Cardiovascular:  Normal S1 and S2, no murmurs rub or gallop  GI: Soft, nondistended, nontender, bowel sounds present  Musculoskeletal: Warm, no edema  Discharge Instructions    Current Discharge Medication List    START taking these medications    Details  polyethylene glycol (MIRALAX) packet Take 17 g by mouth daily as needed. Qty: 14 each, Refills: 0      CONTINUE these medications which have CHANGED   Details  methimazole (TAPAZOLE) 10 MG tablet Take 1 tablet (10 mg total) by mouth 2 (two) times daily. Qty: 60 tablet, Refills: 0      CONTINUE these medications which have NOT CHANGED   Details  albuterol (PROVENTIL) (2.5 MG/3ML) 0.083% nebulizer solution Take 6 mLs (5 mg total) by nebulization every 6 (six) hours as needed for wheezing or shortness of breath. Qty: 75 mL, Refills: 0    aspirin EC 81 MG EC tablet Take 1 tablet (81 mg total) by mouth daily. Qty: 30 tablet, Refills: 0    atorvastatin (LIPITOR) 40 MG tablet Take 1 tablet (40 mg total) by mouth at bedtime. Qty: 30 tablet, Refills: 0    budesonide-formoterol (SYMBICORT) 160-4.5 MCG/ACT inhaler Inhale 2 puffs into the lungs 2 (two) times daily. Qty: 1 Inhaler, Refills: 12    Multiple Vitamin (MULTIVITAMIN WITH MINERALS) TABS tablet Take 1 tablet by mouth daily. Qty: 30 tablet, Refills: 0    SPIRIVA HANDIHALER 18 MCG inhalation capsule Place 1 capsule (18 mcg total) into inhaler and inhale daily. Qty: 30 capsule, Refills: 0      STOP taking these medications     azithromycin (ZITHROMAX Z-PAK) 250 MG tablet      predniSONE (DELTASONE) 20 MG tablet        No Known Allergies Follow-up Information    Follow up with [name], MD In 1 week.   Specialty:  Family Medicine   Contact information:   [address] [place] [id number] [phone]        The results of significant diagnostics from this hospitalization (including imaging, microbiology, ancillary and laboratory) are listed below for reference.    Significant Diagnostic Studies: Ct Abdomen Pelvis Wo Contrast  10/12/2015  CLINICAL DATA:  Acute onset of generalized abdominal pain and vomiting. Initial encounter. EXAM: CT ABDOMEN AND PELVIS WITHOUT CONTRAST TECHNIQUE: Multidetector  CT imaging of the abdomen and pelvis was performed following the standard protocol without IV contrast. COMPARISON:  CT of the abdomen and pelvis from 05/02/2015 FINDINGS: The visualized lung bases are clear. There is recurrent dilatation of small bowel loops up to 7.0 cm in maximal diameter, with an apparent transition point noted at the lower mid abdomen, and decompressed distal small bowel loops. The colon is still largely filled with stool. This raises concern for small bowel obstruction, most likely secondary to an adhesion. The liver and spleen are unremarkable in appearance. The gallbladder is within normal limits. The pancreas and right adrenal gland are unremarkable. A large 4.1 cm left adrenal adenoma is again noted. The kidneys are unremarkable in appearance. There is no evidence of hydronephrosis. No renal or ureteral stones are seen. No perinephric stranding is appreciated. No free fluid is identified. The small bowel is unremarkable in appearance. The stomach is within normal limits. No acute vascular abnormalities are seen. Mild scattered calcification is noted along the abdominal aorta and its branches. The patient is status post appendectomy. The bladder is mildly distended and grossly unremarkable. The prostate is borderline enlarged, measuring 4.9 cm in  transverse dimension. No inguinal lymphadenopathy is seen. No acute osseous abnormalities are identified. Vacuum phenomenon and disc space narrowing are noted at L5-S1. IMPRESSION: 1. Recurrent dilatation of small bowel loops up to 7.0 cm in maximal diameter, with an apparent transition point at the lower mid abdomen, and decompressed distal small bowel loops. The colon is still largely filled with stool. This is concerning for relatively high-grade small bowel obstruction, most likely secondary to adhesion. 2. Mild scattered calcification along the abdominal aorta and its branches. 3. Large 4.1 cm left adrenal adenoma again noted. 4. Borderline  enlarged prostate. Electronically Signed   By: Garald Balding M.D.   On: 10/12/2015 04:18   Dg Chest 2 View  10/11/2015  CLINICAL DATA:  Upper and mid abdominal pain for 3 days, chest pain beginning last night. Poor historian. History of COPD, CHF. EXAM: CHEST  2 VIEW COMPARISON:  Chest radiograph October 02, 2015 FINDINGS: Increased lung volumes with LEFT greater than RIGHT apical lucencies most compatible with bullous changes and COPD with flattened hemidiaphragms. No pleural effusion or focal consolidation. RIGHT midlung zone scarring. Mildly elevated LEFT hemidiaphragm. Cardiomediastinal silhouette is normal. No pneumothorax. Soft tissue planes and included osseous structures are normal. IMPRESSION: COPD, no superimposed acute cardiopulmonary process. Electronically Signed   By: Elon Alas M.D.   On: 10/11/2015 23:51   Dg Chest 2 View  10/02/2015  CLINICAL DATA:  Acute onset of generalized chest pain and shortness of breath. Initial encounter. EXAM: CHEST  2 VIEW COMPARISON:  Chest radiograph performed 09/08/2015 FINDINGS: The lungs are hyperexpanded, with flattening of the hemidiaphragms, compatible with COPD. Underlying peribronchial thickening is seen. A small left pleural effusion is suggested. There is no evidence of pneumothorax. The heart is normal in size; the mediastinal contour is within normal limits. No acute osseous abnormalities are seen. IMPRESSION: Findings of COPD.  Small left pleural effusion suggested. Electronically Signed   By: Garald Balding M.D.   On: 10/02/2015 04:33   Dg Abd Portable 1v  10/13/2015  CLINICAL DATA:  Abdominal pain and distention EXAM: PORTABLE ABDOMEN - 1 VIEW COMPARISON:  05/08/2015 FINDINGS: There is no obvious free intraperitoneal gas. Prominent stool burden throughout the colon. No disproportionate dilatation of small bowel. No pneumatosis. IMPRESSION: No evidence of small-bowel obstruction. Prominent stool burden throughout the colon. Electronically  Signed   By: Marybelle Killings M.D.   On: 10/13/2015 08:33    Microbiology: Recent Results (from the past 240 hour(s))  MRSA PCR Screening     Status: Abnormal   Collection Time: 10/12/15  4:42 PM  Result Value Ref Range Status   MRSA by PCR POSITIVE (A) NEGATIVE Final    Comment:        The GeneXpert MRSA Assay (FDA approved for NASAL specimens only), is one component of a comprehensive MRSA colonization surveillance program. It is not intended to diagnose MRSA infection nor to guide or monitor treatment for MRSA infections. RESULT CALLED TO, READ BACK BY AND VERIFIED WITH: [facility] RN AT 1952 ON 1.14.17 BY EPPERSON,S      Labs: Basic Metabolic Panel:  Recent Labs Lab 10/12/15 0018 10/12/15 0549 10/13/15 0413  NA 133* 133* 138  K 4.6 4.3 4.2  CL 97* 101 112*  CO2 21* 21* 19*  GLUCOSE 106* 116* 96  BUN 66* 59* 37*  CREATININE 2.42* 1.67* 0.65  CALCIUM 8.3* 8.4* 8.3*   Liver Function Tests:  Recent Labs Lab 10/12/15 0549  AST 18  ALT 14*  ALKPHOS 51  BILITOT 0.9  PROT 6.3*  ALBUMIN 3.8    Recent Labs Lab 10/12/15 0549  LIPASE 24   No results for input(s): AMMONIA in the last 168 hours. CBC:  Recent Labs Lab 10/12/15 0018 10/12/15 0549  WBC 10.3 10.7*  HGB 15.9 14.5  HCT 46.7 43.6  MCV 89.1 89.5  PLT 160 163   Cardiac Enzymes:  Recent Labs Lab 10/12/15 0514 10/12/15 1039 10/12/15 1644  TROPONINI 0.03 <0.03 <0.03   BNP: BNP (last 3 results)  Recent Labs  08/11/15 1242 09/08/15 0516 10/12/15 0517  BNP 37.2 61.1 16.0    ProBNP (last 3 results) No results for input(s): PROBNP in the last 8760 hours.  CBG:  Recent Labs Lab 10/12/15 0800 10/13/15 0748 10/14/15 0735  GLUCAP 108* 89 88       Signed:  Louellen Molder MD.  [facility] 10/14/2015, 12:27 PM

## 2015-10-14 NOTE — Progress Notes (Signed)
CSW received referral that pt lives in a motel.   Inappropriate CSW referral as pt has safe place to return to upon discharge.  Per RN, pt does need cab voucher for return to motel.  CSW spoke to CSW Surveyor, quantity, Nathaniel Man and had cab voucher approved.  Pt confirmed he had key to re-enter motel room upon arrival and pt staying at [facility].   CSW provided RN cab voucher to arrange cab via [facility] when pt is ready for transport.  No further social work needs identified at this time.  CSW signing off.   Alison Murray, MSW, [place] [facility] [phone]

## 2015-10-17 ENCOUNTER — Encounter (HOSPITAL_COMMUNITY): Payer: Self-pay | Admitting: Emergency Medicine

## 2015-10-17 ENCOUNTER — Emergency Department (HOSPITAL_COMMUNITY)
Admission: EM | Admit: 2015-10-17 | Discharge: 2015-10-18 | Disposition: A | Discharge status: Home / Self Care | Payer: Medicare Other | Attending: Emergency Medicine | Admitting: Emergency Medicine

## 2015-10-17 ENCOUNTER — Emergency Department (HOSPITAL_COMMUNITY): Payer: Medicare Other

## 2015-10-17 DIAGNOSIS — Z9889 Other specified postprocedural states: Secondary | ICD-10-CM | POA: Insufficient documentation

## 2015-10-17 DIAGNOSIS — Z8669 Personal history of other diseases of the nervous system and sense organs: Secondary | ICD-10-CM | POA: Diagnosis not present

## 2015-10-17 DIAGNOSIS — R0602 Shortness of breath: Secondary | ICD-10-CM | POA: Diagnosis not present

## 2015-10-17 DIAGNOSIS — R000 Tachycardia, unspecified: Secondary | ICD-10-CM | POA: Diagnosis not present

## 2015-10-17 DIAGNOSIS — E785 Hyperlipidemia, unspecified: Secondary | ICD-10-CM | POA: Diagnosis not present

## 2015-10-17 DIAGNOSIS — J441 Chronic obstructive pulmonary disease with (acute) exacerbation: Secondary | ICD-10-CM | POA: Diagnosis not present

## 2015-10-17 DIAGNOSIS — Z7951 Long term (current) use of inhaled steroids: Secondary | ICD-10-CM | POA: Insufficient documentation

## 2015-10-17 DIAGNOSIS — Z9861 Coronary angioplasty status: Secondary | ICD-10-CM | POA: Insufficient documentation

## 2015-10-17 DIAGNOSIS — F17210 Nicotine dependence, cigarettes, uncomplicated: Secondary | ICD-10-CM | POA: Diagnosis not present

## 2015-10-17 DIAGNOSIS — Z7982 Long term (current) use of aspirin: Secondary | ICD-10-CM | POA: Diagnosis not present

## 2015-10-17 DIAGNOSIS — R079 Chest pain, unspecified: Secondary | ICD-10-CM | POA: Diagnosis present

## 2015-10-17 DIAGNOSIS — I509 Heart failure, unspecified: Secondary | ICD-10-CM | POA: Insufficient documentation

## 2015-10-17 DIAGNOSIS — Z79899 Other long term (current) drug therapy: Secondary | ICD-10-CM | POA: Insufficient documentation

## 2015-10-17 LAB — CBC
HCT: 38 % — ABNORMAL LOW (ref 39.0–52.0)
HEMOGLOBIN: 13.1 g/dL (ref 13.0–17.0)
MCH: 30 pg (ref 26.0–34.0)
MCHC: 34.5 g/dL (ref 30.0–36.0)
MCV: 87 fL (ref 78.0–100.0)
Platelets: 146 10*3/uL — ABNORMAL LOW (ref 150–400)
RBC: 4.37 MIL/uL (ref 4.22–5.81)
RDW: 13.3 % (ref 11.5–15.5)
WBC: 7.2 10*3/uL (ref 4.0–10.5)

## 2015-10-17 LAB — BASIC METABOLIC PANEL
ANION GAP: 10 (ref 5–15)
BUN: 13 mg/dL (ref 6–20)
CALCIUM: 8.2 mg/dL — AB (ref 8.9–10.3)
CHLORIDE: 96 mmol/L — AB (ref 101–111)
CO2: 29 mmol/L (ref 22–32)
Creatinine, Ser: 0.61 mg/dL (ref 0.61–1.24)
GFR calc non Af Amer: >60 mL/min (ref >60)
Glucose, Bld: 124 mg/dL — ABNORMAL HIGH (ref 65–99)
POTASSIUM: 3.4 mmol/L — AB (ref 3.5–5.1)
Sodium: 135 mmol/L (ref 135–145)

## 2015-10-17 LAB — I-STAT TROPONIN, ED: TROPONIN I, POC: 0.05 ng/mL (ref 0.00–0.08)

## 2015-10-17 MED ORDER — IPRATROPIUM-ALBUTEROL 0.5-2.5 (3) MG/3ML IN SOLN
3.0000 mL | Freq: Once | RESPIRATORY_TRACT | Status: AC
  Administered 2015-10-17: 3 mL via RESPIRATORY_TRACT
  Filled 2015-10-17: qty 3

## 2015-10-17 MED ORDER — ALBUTEROL SULFATE HFA 108 (90 BASE) MCG/ACT IN AERS
2.0000 | INHALATION_SPRAY | Freq: Once | RESPIRATORY_TRACT | Status: AC
  Administered 2015-10-17: 2 via RESPIRATORY_TRACT
  Filled 2015-10-17: qty 6.7

## 2015-10-17 NOTE — ED Notes (Signed)
Per EMS:  Patient presents to ED from a hotel where he is staying.  States he had CP that began last night.  Was seen 6 days ago for the same.  EMS gave ASA, 1 nitro, and 1 duoneb (EMS states wheezes in all four fields).  Pt hypertensive, HR irregular and tachy, occasional PVCs, but patient comfortable, alert in room.

## 2015-10-17 NOTE — ED Notes (Signed)
MD at bedside. 

## 2015-10-18 MED ORDER — TIOTROPIUM BROMIDE MONOHYDRATE 18 MCG IN CAPS: 18.0000 ug | ORAL_CAPSULE | Freq: Every morning | RESPIRATORY_TRACT | Status: DC

## 2015-10-18 NOTE — ED Provider Notes (Signed)
CSN: [id number]     Arrival date & time 10/17/15  2024 History   First MD Initiated Contact with Patient 10/17/15 2025     Chief Complaint  Patient presents with  . Chest Pain     (Consider location/radiation/quality/duration/timing/severity/associated sxs/prior Treatment) HPI Patient reports remittent chest pains for a few seconds at a time. They have been coming and going for several days. Patient reports he continues to have some cough that is productive of yellow sputum. Ports that he has run out of all of his inhalers. No fever no lower extremity swelling. Past Medical History  Diagnosis Date  . COPD (chronic obstructive pulmonary disease) ([place])   . HOH (hard of hearing)   . CHF (congestive heart failure) (HCC) EF 20 - 25% in 2015  . HLD (hyperlipidemia)    Past Surgical History  Procedure Laterality Date  . Appendectomy    . Left heart catheterization with coronary angiogram N/A 11/07/2013    Procedure: LEFT HEART CATHETERIZATION WITH CORONARY ANGIOGRAM;  Surgeon: Clent Demark, MD;  Location: [facility] C[facility] LAB;  Service: Cardiovascular;  Laterality: N/A;  . Cardiac stents      No stent history   Family History  Problem Relation Age of Onset  . Stroke Mother    Social History  Substance Use Topics  . Smoking status: Current Every Day Smoker -- 0.50 packs/day    Types: Cigarettes  . Smokeless tobacco: Never Used  . Alcohol Use: No    Review of Systems 10 Systems reviewed and are negative for acute change except as noted in the HPI.    Allergies  Review of patient's allergies indicates no known allergies.  Home Medications   Prior to Admission medications   Medication Sig Start Date End Date Taking? Authorizing Provider  aspirin EC 81 MG EC tablet Take 1 tablet (81 mg total) by mouth daily. 03/04/15  Yes Maryann Mikhail, DO  atorvastatin (LIPITOR) 40 MG tablet Take 1 tablet (40 mg total) by mouth at bedtime. 03/04/15  Yes Maryann Mikhail, DO  budesonide-formoterol  (SYMBICORT) 160-4.5 MCG/ACT inhaler Inhale 2 puffs into the lungs 2 (two) times daily. 08/13/15  Yes Thurnell Lose, MD  methimazole (TAPAZOLE) 10 MG tablet Take 1 tablet (10 mg total) by mouth 2 (two) times daily. 10/14/15  Yes Nishant Dhungel, MD  Multiple Vitamin (MULTIVITAMIN WITH MINERALS) TABS tablet Take 1 tablet by mouth daily. 03/04/15  Yes Maryann Mikhail, DO  polyethylene glycol (MIRALAX) packet Take 17 g by mouth daily as needed. 10/14/15  Yes Nishant Dhungel, MD  SPIRIVA HANDIHALER 18 MCG inhalation capsule Place 1 capsule (18 mcg total) into inhaler and inhale daily. 08/13/15  Yes Thurnell Lose, MD  albuterol (PROVENTIL) (2.5 MG/3ML) 0.083% nebulizer solution Take 6 mLs (5 mg total) by nebulization every 6 (six) hours as needed for wheezing or shortness of breath. 08/25/15   Larene Pickett, PA-C  tiotropium (SPIRIVA HANDIHALER) 18 MCG inhalation capsule Place 1 capsule (18 mcg total) into inhaler and inhale every morning. 10/18/15   Charlesetta Shanks, MD   BP 151/97 mmHg  Pulse 101  Resp 18  SpO2 94% Physical Exam  Constitutional: He is oriented to person, place, and time. He appears well-developed and well-nourished.  HENT:  Head: Normocephalic and atraumatic.  Eyes: EOM are normal. Pupils are equal, round, and reactive to light.  Neck: Neck supple.  Cardiovascular: Normal rate, regular rhythm, normal heart sounds and intact distal pulses.   Pulmonary/Chest: Effort normal.  Are soft breath  sounds consistent with COPD. Rare expiratory wheeze.  Abdominal: Soft. Bowel sounds are normal. He exhibits no distension. There is no tenderness.  Musculoskeletal: Normal range of motion. He exhibits no edema or tenderness.  Neurological: He is alert and oriented to person, place, and time. He has normal strength. Coordination normal. GCS eye subscore is 4. GCS verbal subscore is 5. GCS motor subscore is 6.  Skin: Skin is warm, dry and intact.  Psychiatric: He has a normal mood and affect.     ED Course  Procedures (including critical care time) Labs Review Labs Reviewed  BASIC METABOLIC PANEL - Abnormal; Notable for the following:    Potassium 3.4 (*)    Chloride 96 (*)    Glucose, Bld 124 (*)    Calcium 8.2 (*)    All other components within normal limits  CBC - Abnormal; Notable for the following:    HCT 38.0 (*)    Platelets 146 (*)    All other components within normal limits  I-STAT TROPOININ, ED    Imaging Review Dg Chest 2 View  10/17/2015  CLINICAL DATA:  Acute onset of generalized chest pain and wheezing. High blood pressure and arrhythmia. Tachycardia. Initial encounter. EXAM: CHEST  2 VIEW COMPARISON:  Chest radiograph performed 10/11/2015 FINDINGS: The lungs are hyperexpanded, with flattening of the hemidiaphragms, compatible with COPD. Minimal bilateral opacities likely reflect atelectasis, though mild infection could have a similar appearance. No pleural effusion or pneumothorax is seen. The heart is normal in size; the mediastinal contour is within normal limits. No acute osseous abnormalities are seen. IMPRESSION: Findings of COPD. Minimal bilateral opacities likely reflect atelectasis, though mild infection could have a similar appearance, depending on the patient's symptoms. Electronically Signed   By: Garald Balding M.D.   On: 10/17/2015 21:12   I have personally reviewed and evaluated these images and lab results as part of my medical decision-making.   EKG Interpretation None      MDM   Final diagnoses:  COPD exacerbation ([place])   Patient reports very brief, sharp chest pains. Currently he has none. He has COPD but at this time does not appear to be having secondary pneumonia. He does not have fever or general constitutional illness. Shortness of breath is not worse than usual. He does have report is run out of all his inhalers. He is given albuterol through the emergency department and a prescription for Spiriva. Patient's counseled on signs and  symptoms were to return.    Charlesetta Shanks, MD 10/18/15 [phone]

## 2015-10-18 NOTE — ED Notes (Signed)
Left a voicemail with Mariann Laster with Case Management for assistance

## 2015-10-18 NOTE — ED Notes (Signed)
Spoke to MD about placing a request to case management for patient assistance making and getting to appointments at COPD clinic

## 2015-10-18 NOTE — Discharge Instructions (Signed)
Chronic Obstructive Pulmonary Disease Chronic obstructive pulmonary disease (COPD) is a common lung condition in which airflow from the lungs is limited. COPD is a general term that can be used to describe many different lung problems that limit airflow, including both chronic bronchitis and emphysema. If you have COPD, your lung function will probably never return to normal, but there are measures you can take to improve lung function and make yourself feel better. CAUSES   Smoking (common).  Exposure to secondhand smoke.  Genetic problems.  Chronic inflammatory lung diseases or recurrent infections. SYMPTOMS  Shortness of breath, especially with physical activity.  Deep, persistent (chronic) cough with a large amount of thick mucus.  Wheezing.  Rapid breaths (tachypnea).  Gray or bluish discoloration (cyanosis) of the skin, especially in your fingers, toes, or lips.  Fatigue.  Weight loss.  Frequent infections or episodes when breathing symptoms become much worse (exacerbations).  Chest tightness. DIAGNOSIS Your health care provider will take a medical history and perform a physical examination to diagnose COPD. Additional tests for COPD may include:  Lung (pulmonary) function tests.  Chest X-ray.  CT scan.  Blood tests. TREATMENT  Treatment for COPD may include:  Inhaler and nebulizer medicines. These help manage the symptoms of COPD and make your breathing more comfortable.  Supplemental oxygen. Supplemental oxygen is only helpful if you have a low oxygen level in your blood.  Exercise and physical activity. These are beneficial for nearly all people with COPD.  Lung surgery or transplant.  Nutrition therapy to gain weight, if you are underweight.  Pulmonary rehabilitation. This may involve working with a team of health care providers and specialists, such as respiratory, occupational, and physical therapists. HOME CARE INSTRUCTIONS  Take all medicines  (inhaled or pills) as directed by your health care provider.  Avoid over-the-counter medicines or cough syrups that dry up your airway (such as antihistamines) and slow down the elimination of secretions unless instructed otherwise by your health care provider.  If you are a smoker, the most important thing that you can do is stop smoking. Continuing to smoke will cause further lung damage and breathing trouble. Ask your health care provider for help with quitting smoking. He or she can direct you to community resources or hospitals that provide support.  Avoid exposure to irritants such as smoke, chemicals, and fumes that aggravate your breathing.  Use oxygen therapy and pulmonary rehabilitation if directed by your health care provider. If you require home oxygen therapy, ask your health care provider whether you should purchase a pulse oximeter to measure your oxygen level at home.  Avoid contact with individuals who have a contagious illness.  Avoid extreme temperature and humidity changes.  Eat healthy foods. Eating smaller, more frequent meals and resting before meals may help you maintain your strength.  Stay active, but balance activity with periods of rest. Exercise and physical activity will help you maintain your ability to do things you want to do.  Preventing infection and hospitalization is very important when you have COPD. Make sure to receive all the vaccines your health care provider recommends, especially the pneumococcal and influenza vaccines. Ask your health care provider whether you need a pneumonia vaccine.  Learn and use relaxation techniques to manage stress.  Learn and use controlled breathing techniques as directed by your health care provider. Controlled breathing techniques include:  Pursed lip breathing. Start by breathing in (inhaling) through your nose for 1 second. Then, purse your lips as if you were   going to whistle and breathe out (exhale) through the  pursed lips for 2 seconds.  Diaphragmatic breathing. Start by putting one hand on your abdomen just above your waist. Inhale slowly through your nose. The hand on your abdomen should move out. Then purse your lips and exhale slowly. You should be able to feel the hand on your abdomen moving in as you exhale.  Learn and use controlled coughing to clear mucus from your lungs. Controlled coughing is a series of short, progressive coughs. The steps of controlled coughing are: 1. Lean your head slightly forward. 2. Breathe in deeply using diaphragmatic breathing. 3. Try to hold your breath for 3 seconds. 4. Keep your mouth slightly open while coughing twice. 5. Spit any mucus out into a tissue. 6. Rest and repeat the steps once or twice as needed. SEEK MEDICAL CARE IF:  You are coughing up more mucus than usual.  There is a change in the color or thickness of your mucus.  Your breathing is more labored than usual.  Your breathing is faster than usual. SEEK IMMEDIATE MEDICAL CARE IF:  You have shortness of breath while you are resting.  You have shortness of breath that prevents you from:  Being able to talk.  Performing your usual physical activities.  You have chest pain lasting longer than 5 minutes.  Your skin color is more cyanotic than usual.  You measure low oxygen saturations for longer than 5 minutes with a pulse oximeter. MAKE SURE YOU:  Understand these instructions.  Will watch your condition.  Will get help right away if you are not doing well or get worse.   This information is not intended to replace advice given to you by your health care provider. Make sure you discuss any questions you have with your health care provider.   Document Released: 06/24/2005 Document Revised: 10/05/2014 Document Reviewed: 05/11/2013 Elsevier Interactive Patient Education 2016 Elsevier Inc.  

## 2015-10-18 NOTE — ED Notes (Signed)
Pt able to dress and ambulate to wheelchair independently 

## 2015-11-08 ENCOUNTER — Emergency Department (HOSPITAL_COMMUNITY)
Admission: EM | Admit: 2015-11-08 | Discharge: 2015-11-08 | Disposition: A | Discharge status: Home / Self Care | Payer: Medicare Other | Attending: Emergency Medicine | Admitting: Emergency Medicine

## 2015-11-08 ENCOUNTER — Emergency Department (HOSPITAL_COMMUNITY): Payer: Medicare Other

## 2015-11-08 ENCOUNTER — Encounter (HOSPITAL_COMMUNITY): Payer: Self-pay | Admitting: Emergency Medicine

## 2015-11-08 DIAGNOSIS — J449 Chronic obstructive pulmonary disease, unspecified: Secondary | ICD-10-CM

## 2015-11-08 DIAGNOSIS — Z8639 Personal history of other endocrine, nutritional and metabolic disease: Secondary | ICD-10-CM | POA: Insufficient documentation

## 2015-11-08 DIAGNOSIS — R05 Cough: Secondary | ICD-10-CM | POA: Diagnosis not present

## 2015-11-08 DIAGNOSIS — H9190 Unspecified hearing loss, unspecified ear: Secondary | ICD-10-CM | POA: Diagnosis not present

## 2015-11-08 DIAGNOSIS — D649 Anemia, unspecified: Secondary | ICD-10-CM

## 2015-11-08 DIAGNOSIS — R0602 Shortness of breath: Secondary | ICD-10-CM

## 2015-11-08 DIAGNOSIS — J441 Chronic obstructive pulmonary disease with (acute) exacerbation: Secondary | ICD-10-CM | POA: Insufficient documentation

## 2015-11-08 DIAGNOSIS — Z79899 Other long term (current) drug therapy: Secondary | ICD-10-CM | POA: Insufficient documentation

## 2015-11-08 DIAGNOSIS — Z9889 Other specified postprocedural states: Secondary | ICD-10-CM | POA: Diagnosis not present

## 2015-11-08 DIAGNOSIS — R069 Unspecified abnormalities of breathing: Secondary | ICD-10-CM | POA: Diagnosis not present

## 2015-11-08 DIAGNOSIS — F17210 Nicotine dependence, cigarettes, uncomplicated: Secondary | ICD-10-CM | POA: Insufficient documentation

## 2015-11-08 DIAGNOSIS — I509 Heart failure, unspecified: Secondary | ICD-10-CM | POA: Diagnosis not present

## 2015-11-08 LAB — BASIC METABOLIC PANEL
ANION GAP: 8 (ref 5–15)
BUN: 11 mg/dL (ref 6–20)
CHLORIDE: 105 mmol/L (ref 101–111)
CO2: 24 mmol/L (ref 22–32)
CREATININE: 0.69 mg/dL (ref 0.61–1.24)
Calcium: 8.7 mg/dL — ABNORMAL LOW (ref 8.9–10.3)
GFR calc non Af Amer: >60 mL/min (ref >60)
Glucose, Bld: 109 mg/dL — ABNORMAL HIGH (ref 65–99)
Potassium: 4.1 mmol/L (ref 3.5–5.1)
SODIUM: 137 mmol/L (ref 135–145)

## 2015-11-08 LAB — CBC
HCT: 38.2 % — ABNORMAL LOW (ref 39.0–52.0)
HEMOGLOBIN: 12.4 g/dL — AB (ref 13.0–17.0)
MCH: 29 pg (ref 26.0–34.0)
MCHC: 32.5 g/dL (ref 30.0–36.0)
MCV: 89.5 fL (ref 78.0–100.0)
PLATELETS: 190 10*3/uL (ref 150–400)
RBC: 4.27 MIL/uL (ref 4.22–5.81)
RDW: 13.5 % (ref 11.5–15.5)
WBC: 7 10*3/uL (ref 4.0–10.5)

## 2015-11-08 MED ORDER — TIOTROPIUM BROMIDE MONOHYDRATE 18 MCG IN CAPS: 18.0000 ug | ORAL_CAPSULE | Freq: Every day | RESPIRATORY_TRACT | Status: DC

## 2015-11-08 MED ORDER — ALBUTEROL SULFATE HFA 108 (90 BASE) MCG/ACT IN AERS
1.0000 | INHALATION_SPRAY | Freq: Once | RESPIRATORY_TRACT | Status: AC
  Administered 2015-11-08: 2 via RESPIRATORY_TRACT
  Filled 2015-11-08: qty 6.7

## 2015-11-08 MED ORDER — IPRATROPIUM-ALBUTEROL 0.5-2.5 (3) MG/3ML IN SOLN
3.0000 mL | Freq: Once | RESPIRATORY_TRACT | Status: AC
  Administered 2015-11-08: 3 mL via RESPIRATORY_TRACT
  Filled 2015-11-08: qty 3

## 2015-11-08 NOTE — ED Notes (Signed)
Per EMS pt complaint of SOB onset yesterday when ran out of inhaler; hx of COPD. Given 10 mg albuterol, 0.5 mg of atrovent, and 125 mg of solu medrol en route.

## 2015-11-08 NOTE — ED Provider Notes (Signed)
CSN: [id number]     Arrival date & time 11/08/15  1412 History   First MD Initiated Contact with Patient 11/08/15 1507     Chief Complaint  Patient presents with  . Shortness of Breath     (Consider location/radiation/quality/duration/timing/severity/associated sxs/prior Treatment) Patient is a 65 y.o. male presenting with shortness of breath. The history is provided by the patient and medical records. No language interpreter was used.  Shortness of Breath Associated symptoms: no abdominal pain, no fever, no headaches, no neck pain, no rash, no sore throat, no vomiting and no wheezing    Anthony Bolton is a 65 y.o. male  with a PMH of COPD, HLD, CHF who presents to the Emergency Department complaining of progressive shortness of breath worsening over the last two days since he has been out of his Spiriva and albuterol inhalers. Patient denies associated chest pain or fever. Denies change in baseline cough. Duoneb and 125 solumedrol given by EMS PTA. Upon initial examination, patient states much improvement after EMS medications.   Past Medical History  Diagnosis Date  . COPD (chronic obstructive pulmonary disease) ([place])   . HOH (hard of hearing)   . CHF (congestive heart failure) ([facility]) EF 20 - 25% in 2015  . HLD (hyperlipidemia)    Past Surgical History  Procedure Laterality Date  . Appendectomy    . Left heart catheterization with coronary angiogram N/A 11/07/2013    Procedure: LEFT HEART CATHETERIZATION WITH CORONARY ANGIOGRAM;  Surgeon: Clent Demark, MD;  Location: [facility] CATH LAB;  Service: Cardiovascular;  Laterality: N/A;  . Cardiac stents      No stent history   Family History  Problem Relation Age of Onset  . Stroke Mother    Social History  Substance Use Topics  . Smoking status: Current Every Day Smoker -- 0.50 packs/day    Types: Cigarettes  . Smokeless tobacco: Never Used  . Alcohol Use: No    Review of Systems  Constitutional: Negative for fever and chills.   HENT: Negative for congestion and sore throat.   Eyes: Negative for visual disturbance.  Respiratory: Positive for shortness of breath. Negative for chest tightness and wheezing.   Cardiovascular: Negative.   Gastrointestinal: Negative for nausea, vomiting and abdominal pain.  Genitourinary: Negative for dysuria.  Musculoskeletal: Negative for back pain and neck pain.  Skin: Negative for rash.  Neurological: Negative for dizziness, weakness and headaches.      Allergies  Review of patient's allergies indicates no known allergies.  Home Medications   Prior to Admission medications   Medication Sig Start Date End Date Taking? Authorizing Provider  albuterol (PROVENTIL HFA;VENTOLIN HFA) 108 (90 Base) MCG/ACT inhaler Inhale 2 puffs into the lungs every 6 (six) hours as needed for wheezing or shortness of breath.   Yes Historical Provider, MD  albuterol (PROVENTIL) (2.5 MG/3ML) 0.083% nebulizer solution Take 6 mLs (5 mg total) by nebulization every 6 (six) hours as needed for wheezing or shortness of breath. 08/25/15  Yes Larene Pickett, PA-C  ibuprofen (ADVIL,MOTRIN) 200 MG tablet Take 400 mg by mouth every 6 (six) hours as needed for headache, mild pain or moderate pain.   Yes Historical Provider, MD  aspirin EC 81 MG EC tablet Take 1 tablet (81 mg total) by mouth daily. Patient not taking: Reported on 11/08/2015 03/04/15   Velta Addison Mikhail, DO  atorvastatin (LIPITOR) 40 MG tablet Take 1 tablet (40 mg total) by mouth at bedtime. Patient not taking: Reported on 11/08/2015 03/04/15  Maryann Mikhail, DO  budesonide-formoterol (SYMBICORT) 160-4.5 MCG/ACT inhaler Inhale 2 puffs into the lungs 2 (two) times daily. Patient not taking: Reported on 11/08/2015 08/13/15   Thurnell Lose, MD  methimazole (TAPAZOLE) 10 MG tablet Take 1 tablet (10 mg total) by mouth 2 (two) times daily. Patient not taking: Reported on 11/08/2015 10/14/15   Flonnie Overman Dhungel, MD  Multiple Vitamin (MULTIVITAMIN WITH MINERALS)  TABS tablet Take 1 tablet by mouth daily. Patient not taking: Reported on 11/08/2015 03/04/15   Velta Addison Mikhail, DO  polyethylene glycol [facility]) packet Take 17 g by mouth daily as needed. Patient not taking: Reported on 11/08/2015 10/14/15   Nishant Dhungel, MD  tiotropium (SPIRIVA HANDIHALER) 18 MCG inhalation capsule Place 1 capsule (18 mcg total) into inhaler and inhale daily. 11/08/15   Jayma Volpi Pilcher Govani Radloff, PA-C   BP 120/91 mmHg  Pulse 86  Temp(Src) 97.4 F (36.3 C) (Oral)  Resp 20  SpO2 99% Physical Exam  Constitutional: He is oriented to person, place, and time. He appears well-developed and well-nourished.  Hard of hearing, in no acute distress  HENT:  Head: Normocephalic and atraumatic.  Neck: Normal range of motion. Neck supple.  Cardiovascular: Normal rate, regular rhythm, normal heart sounds and intact distal pulses.  Exam reveals no gallop and no friction rub.   No murmur heard. Pulmonary/Chest: Effort normal. No respiratory distress.  Diminished breath sounds bilaterally. No wheezing or crackles.   Abdominal: Soft. He exhibits no distension. There is no tenderness.  Musculoskeletal: He exhibits no edema.  Neurological: He is alert and oriented to person, place, and time.  Skin: Skin is warm and dry. No rash noted.  Psychiatric: He has a normal mood and affect. His behavior is normal. Judgment and thought content normal.  Nursing note and vitals reviewed.   ED Course  Procedures (including critical care time) Labs Review Labs Reviewed  BASIC METABOLIC PANEL - Abnormal; Notable for the following:    Glucose, Bld 109 (*)    Calcium 8.7 (*)    All other components within normal limits  CBC - Abnormal; Notable for the following:    Hemoglobin 12.4 (*)    HCT 38.2 (*)    All other components within normal limits    Imaging Review Dg Chest 2 View  11/08/2015  CLINICAL DATA:  Shortness of breath and cough for a few days. Initial encounter. EXAM: CHEST  2 VIEW COMPARISON:   PA and lateral chest 10/17/2015, 10/11/2015 and 03/25/2015. CT chest 03/25/2015. FINDINGS: The lungs are markedly emphysematous but clear. No pneumothorax or pleural effusion. Heart size is normal. Remote left clavicle fracture is noted. IMPRESSION: Emphysema without acute disease. Electronically Signed   By: Inge Rise M.D.   On: 11/08/2015 15:52   I have personally reviewed and evaluated these images and lab results as part of my medical decision-making.   EKG Interpretation None      MDM   Final diagnoses:  Shortness of breath  Anemia, unspecified anemia type  Chronic obstructive pulmonary disease, unspecified COPD type ([place])   Anthony Bolton presents with worsening shortness of breath c/w previous COPD exacerbations. Pt. Has been out of inhalers for the last two days. Pt. Has been seen in ER multiple times for similar complaints, often after running out of inhalers - states he is having trouble setting up a PCP appointment and financial trouble often prevents him from getting transportation to appointments.  Labs: CBC, BMP Imaging: CXR shows emphysema with no acute disease  Consults:  Care management who informed patient that medicaid/medicare can transport her to PCP appointments.   Therapeutics: Inhaler provided in ED, Duoneb x1 in ED -- Patient reevaluated and patient feels much improved after treatment. O2 93-100% on RA during ED stay.   A&P: COPD - Per care management, they will try to get follow up through [facility] and [facility]. Both I and care management have discussed importance of PCP follow up to avoid running out of inhalers.   Anemia: H&H of 12.4, 38.2 - denies blood in stools. Recommended PCP follow up.   [facility] Jaivon Vanbeek, PA-C 11/08/15 1621  Lacretia Leigh, MD 11/08/15 [phone]

## 2015-11-08 NOTE — Discharge Instructions (Signed)
Refill of your inhalers, continue usual home medications Please follow up with your primary doctor in 3-5 days for discussion of your diagnoses and further evaluation after today's visit You can call medicaid/medicare for transport to your appointments!  Please return to the ER for new or worsening symptoms, any additional concerns.

## 2015-11-08 NOTE — Progress Notes (Signed)
CSW met with patient at bedside. Patient confirms that he does have transportation issues.  CSW arranged transportation and provided patient a taxi voucher. CSW made nurse aware.  Willette Brace [phone] ED CSW 11/08/2015 4:53 PM

## 2015-11-08 NOTE — Progress Notes (Addendum)
Referral to [facility] CM for TCC program for COPD but pt not eligible at this time  Entered in d/c instructions  Lucianne Lei Call on 11/08/2015 This is your assigned Medicaid [place] access doctor If you prefer another contact [place] assigned your doctor *You may receive a bill if you go to any family Dr not assigned to you [address] [address] [place] [address] [phone] Medicaid [place] Access Covered Patient [place]: [place], [place] [address] [email or web address] Use this website to assist with understanding your coverage & to renew application As a Medicaid client you MUST contact DSS/SSI each time you change address, move to another [place] or another state to keep your address updated [place] Medicaid Transportation to Dr appts if you are have full Medicaid: [phone], [phone]

## 2015-11-08 NOTE — Progress Notes (Signed)
[facility] consulted EDSW for cab voucher.
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
## 2015-11-20 ENCOUNTER — Emergency Department (HOSPITAL_COMMUNITY): Payer: Medicare Other

## 2015-11-20 ENCOUNTER — Encounter (HOSPITAL_COMMUNITY): Payer: Self-pay | Admitting: Emergency Medicine

## 2015-11-20 ENCOUNTER — Emergency Department (HOSPITAL_COMMUNITY)
Admission: EM | Admit: 2015-11-20 | Discharge: 2015-11-20 | Disposition: A | Discharge status: Home / Self Care | Payer: Medicare Other | Attending: Emergency Medicine | Admitting: Emergency Medicine

## 2015-11-20 DIAGNOSIS — R0602 Shortness of breath: Secondary | ICD-10-CM | POA: Diagnosis not present

## 2015-11-20 DIAGNOSIS — J441 Chronic obstructive pulmonary disease with (acute) exacerbation: Secondary | ICD-10-CM | POA: Diagnosis not present

## 2015-11-20 DIAGNOSIS — Z79899 Other long term (current) drug therapy: Secondary | ICD-10-CM | POA: Diagnosis not present

## 2015-11-20 DIAGNOSIS — I509 Heart failure, unspecified: Secondary | ICD-10-CM | POA: Insufficient documentation

## 2015-11-20 DIAGNOSIS — Z8669 Personal history of other diseases of the nervous system and sense organs: Secondary | ICD-10-CM | POA: Insufficient documentation

## 2015-11-20 DIAGNOSIS — Z7982 Long term (current) use of aspirin: Secondary | ICD-10-CM | POA: Diagnosis not present

## 2015-11-20 DIAGNOSIS — E785 Hyperlipidemia, unspecified: Secondary | ICD-10-CM | POA: Insufficient documentation

## 2015-11-20 DIAGNOSIS — F17210 Nicotine dependence, cigarettes, uncomplicated: Secondary | ICD-10-CM | POA: Insufficient documentation

## 2015-11-20 LAB — CBC WITH DIFFERENTIAL/PLATELET
Basophils Absolute: 0 10*3/uL (ref 0.0–0.1)
Basophils Relative: 0 %
EOS PCT: 2 %
Eosinophils Absolute: 0.2 10*3/uL (ref 0.0–0.7)
HCT: 43.8 % (ref 39.0–52.0)
Hemoglobin: 14.3 g/dL (ref 13.0–17.0)
LYMPHS ABS: 0.8 10*3/uL (ref 0.7–4.0)
LYMPHS PCT: 8 %
MCH: 29.7 pg (ref 26.0–34.0)
MCHC: 32.6 g/dL (ref 30.0–36.0)
MCV: 90.9 fL (ref 78.0–100.0)
MONO ABS: 0 10*3/uL — AB (ref 0.1–1.0)
MONOS PCT: 0 %
Neutro Abs: 9.1 10*3/uL — ABNORMAL HIGH (ref 1.7–7.7)
Neutrophils Relative %: 90 %
PLATELETS: 169 10*3/uL (ref 150–400)
RBC: 4.82 MIL/uL (ref 4.22–5.81)
RDW: 13.7 % (ref 11.5–15.5)
WBC: 10.1 10*3/uL (ref 4.0–10.5)

## 2015-11-20 LAB — BASIC METABOLIC PANEL
ANION GAP: 12 (ref 5–15)
BUN: 24 mg/dL — ABNORMAL HIGH (ref 6–20)
CALCIUM: 9.7 mg/dL (ref 8.9–10.3)
CO2: 22 mmol/L (ref 22–32)
Chloride: 101 mmol/L (ref 101–111)
Creatinine, Ser: 0.82 mg/dL (ref 0.61–1.24)
GFR calc non Af Amer: >60 mL/min (ref >60)
Glucose, Bld: 81 mg/dL (ref 65–99)
POTASSIUM: 4.3 mmol/L (ref 3.5–5.1)
Sodium: 135 mmol/L (ref 135–145)

## 2015-11-20 LAB — I-STAT TROPONIN, ED: Troponin i, poc: 0.02 ng/mL (ref 0.00–0.08)

## 2015-11-20 LAB — BRAIN NATRIURETIC PEPTIDE: B Natriuretic Peptide: 46 pg/mL (ref 0.0–100.0)

## 2015-11-20 MED ORDER — LEVOFLOXACIN 750 MG PO TABS: 750.0000 mg | ORAL_TABLET | Freq: Every day | ORAL | Status: DC

## 2015-11-20 MED ORDER — METHYLPREDNISOLONE SODIUM SUCC 125 MG IJ SOLR
125.0000 mg | Freq: Once | INTRAMUSCULAR | Status: AC
  Administered 2015-11-20: 125 mg via INTRAVENOUS
  Filled 2015-11-20: qty 2

## 2015-11-20 MED ORDER — LEVOFLOXACIN 750 MG PO TABS
750.0000 mg | ORAL_TABLET | Freq: Once | ORAL | Status: AC
  Administered 2015-11-20: 750 mg via ORAL
  Filled 2015-11-20: qty 1

## 2015-11-20 MED ORDER — ALBUTEROL SULFATE (2.5 MG/3ML) 0.083% IN NEBU
5.0000 mg | INHALATION_SOLUTION | Freq: Once | RESPIRATORY_TRACT | Status: AC
  Administered 2015-11-20: 5 mg via RESPIRATORY_TRACT
  Filled 2015-11-20: qty 6

## 2015-11-20 MED ORDER — IPRATROPIUM-ALBUTEROL 0.5-2.5 (3) MG/3ML IN SOLN
3.0000 mL | Freq: Once | RESPIRATORY_TRACT | Status: AC
  Administered 2015-11-20: 3 mL via RESPIRATORY_TRACT
  Filled 2015-11-20: qty 3

## 2015-11-20 MED ORDER — ALBUTEROL SULFATE HFA 108 (90 BASE) MCG/ACT IN AERS
2.0000 | INHALATION_SPRAY | Freq: Once | RESPIRATORY_TRACT | Status: AC
  Administered 2015-11-20: 2 via RESPIRATORY_TRACT
  Filled 2015-11-20: qty 6.7

## 2015-11-20 NOTE — Discharge Instructions (Signed)
Please follow with your primary care doctor your appointment tomorrow.  Take your antibiotics as directed and to completion.  Do not hesitate to return to the emergency room for any new, worsening or concerning symptoms Chronic Obstructive Pulmonary Disease Chronic obstructive pulmonary disease (COPD) is a common lung condition in which airflow from the lungs is limited. COPD is a general term that can be used to describe many different lung problems that limit airflow, including both chronic bronchitis and emphysema. If you have COPD, your lung function will probably never return to normal, but there are measures you can take to improve lung function and make yourself feel better. CAUSES   Smoking (common).  Exposure to secondhand smoke.  Genetic problems.  Chronic inflammatory lung diseases or recurrent infections. SYMPTOMS  Shortness of breath, especially with physical activity.  Deep, persistent (chronic) cough with a large amount of thick mucus.  Wheezing.  Rapid breaths (tachypnea).  Gray or bluish discoloration (cyanosis) of the skin, especially in your fingers, toes, or lips.  Fatigue.  Weight loss.  Frequent infections or episodes when breathing symptoms become much worse (exacerbations).  Chest tightness. DIAGNOSIS Your health care provider will take a medical history and perform a physical examination to diagnose COPD. Additional tests for COPD may include:  Lung (pulmonary) function tests.  Chest X-ray.  CT scan.  Blood tests. TREATMENT  Treatment for COPD may include:  Inhaler and nebulizer medicines. These help manage the symptoms of COPD and make your breathing more comfortable.  Supplemental oxygen. Supplemental oxygen is only helpful if you have a low oxygen level in your blood.  Exercise and physical activity. These are beneficial for nearly all people with COPD.  Lung surgery or transplant.  Nutrition therapy to gain weight, if you are  underweight.  Pulmonary rehabilitation. This may involve working with a team of health care providers and specialists, such as respiratory, occupational, and physical therapists. HOME CARE INSTRUCTIONS  Take all medicines (inhaled or pills) as directed by your health care provider.  Avoid over-the-counter medicines or cough syrups that dry up your airway (such as antihistamines) and slow down the elimination of secretions unless instructed otherwise by your health care provider.  If you are a smoker, the most important thing that you can do is stop smoking. Continuing to smoke will cause further lung damage and breathing trouble. Ask your health care provider for help with quitting smoking. He or she can direct you to community resources or hospitals that provide support.  Avoid exposure to irritants such as smoke, chemicals, and fumes that aggravate your breathing.  Use oxygen therapy and pulmonary rehabilitation if directed by your health care provider. If you require home oxygen therapy, ask your health care provider whether you should purchase a pulse oximeter to measure your oxygen level at home.  Avoid contact with individuals who have a contagious illness.  Avoid extreme temperature and humidity changes.  Eat healthy foods. Eating smaller, more frequent meals and resting before meals may help you maintain your strength.  Stay active, but balance activity with periods of rest. Exercise and physical activity will help you maintain your ability to do things you want to do.  Preventing infection and hospitalization is very important when you have COPD. Make sure to receive all the vaccines your health care provider recommends, especially the pneumococcal and influenza vaccines. Ask your health care provider whether you need a pneumonia vaccine.  Learn and use relaxation techniques to manage stress.  Learn and use controlled  breathing techniques as directed by your health care provider.  Controlled breathing techniques include:  Pursed lip breathing. Start by breathing in (inhaling) through your nose for 1 second. Then, purse your lips as if you were going to whistle and breathe out (exhale) through the pursed lips for 2 seconds.  Diaphragmatic breathing. Start by putting one hand on your abdomen just above your waist. Inhale slowly through your nose. The hand on your abdomen should move out. Then purse your lips and exhale slowly. You should be able to feel the hand on your abdomen moving in as you exhale.  Learn and use controlled coughing to clear mucus from your lungs. Controlled coughing is a series of short, progressive coughs. The steps of controlled coughing are: 1. Lean your head slightly forward. 2. Breathe in deeply using diaphragmatic breathing. 3. Try to hold your breath for 3 seconds. 4. Keep your mouth slightly open while coughing twice. 5. Spit any mucus out into a tissue. 6. Rest and repeat the steps once or twice as needed. SEEK MEDICAL CARE IF:  You are coughing up more mucus than usual.  There is a change in the color or thickness of your mucus.  Your breathing is more labored than usual.  Your breathing is faster than usual. SEEK IMMEDIATE MEDICAL CARE IF:  You have shortness of breath while you are resting.  You have shortness of breath that prevents you from:  Being able to talk.  Performing your usual physical activities.  You have chest pain lasting longer than 5 minutes.  Your skin color is more cyanotic than usual.  You measure low oxygen saturations for longer than 5 minutes with a pulse oximeter. MAKE SURE YOU:  Understand these instructions.  Will watch your condition.  Will get help right away if you are not doing well or get worse.   This information is not intended to replace advice given to you by your health care provider. Make sure you discuss any questions you have with your health care provider.   Document Released:  06/24/2005 Document Revised: 10/05/2014 Document Reviewed: 05/11/2013 Elsevier Interactive Patient Education [employer].

## 2015-11-20 NOTE — ED Notes (Addendum)
Verbalized understanding discharge instructions. In no acute distress.  Pt given a bus pass.

## 2015-11-20 NOTE — ED Notes (Signed)
Bed: RESA Expected date:  Expected time:  Means of arrival:  Comments: Respiratory distress 

## 2015-11-20 NOTE — ED Notes (Signed)
Pt comes to Ed via EMS c/o is SOB with chest tightness, pulse 85, bp 122/93, rr18, spo2 100 on neb. meds pre arrival 10mg  albuterol/ 0.5mg  Atrovent, 125 solumedrol before arrival. Alert X4 / hard of hearing in left ear. Pt verbalizes he has ran out of his home COPD meds.

## 2015-11-20 NOTE — Progress Notes (Signed)
65 yr old medicare/medicaid [place] access pt  Cm consulted by ED RN , Altha Harm for medication assistance and transportation for pt He confirms pcp is Dr Criss Rosales and he scheduled an appt for 11/21/15 at 1:45 pm but having difficulty with transportation Pt reports calling [facility] beginning on Monday at 0918 11/18/15 but has not received a response yet Pt HOH hears better on the right side Pt did not know he had medicaid Cm assisted him by giving him his medicaid number to provide to providers and pharmacies Pt placed a copy of this information in his wallet Pt states his medicaid card has not arrived and Cm encouraged him to check with DSS to verify it went to correct address Discussed cost of medications should be $3 and less now Pt voiced appreciation and understanding  CM provided pt with a list of medicaid transportation services to include [facility], [facility], medicaid transportation Pt confirms he will call each facility He has a smartphone with internet Pt also given [email or web address] to use to find the best pharmacy to use if he runs into issue with costs again.  Pt states [facility] had been assisting him but he noted at intervals he was paying full price Cm made pt aware that with his medicaid he should not be paying full price at any pharmacy if so he needs to try another pharmacy Pt voiced understanding  CM assisted to take pt to ED lobby to await his ride he called Pt also has a bus pass if needed

## 2015-11-20 NOTE — ED Provider Notes (Signed)
Medical screening examination/treatment/procedure(s) were conducted as a shared visit with non-physician practitioner(s) and myself.  I personally evaluated the patient during the encounter.   EKG Interpretation   Date/Time:  Wednesday November 20 2015 06:24:34 EST Ventricular Rate:  82 PR Interval:  159 QRS Duration: 88 QT Interval:  369 QTC Calculation: 431 R Axis:   34 Text Interpretation:  Sinus rhythm Nonspecific T abnrm, anterolateral  leads No significant change since last tracing Confirmed by Dare Sanger MD,  Blaze Nylund [id number]) on 11/20/2015 8:33:24 AM     65 y.o. male presents with shortness of breath and chest tightness. Ran out of meds. No acute distress on exam. Has f/u tomorrow with PCP, return precautions discussed.   See related encounter note   Leo Grosser, MD 11/20/15 2211

## 2015-11-20 NOTE — ED Notes (Signed)
IV access 20 left wrist

## 2015-11-20 NOTE — ED Provider Notes (Signed)
CSN: [id number]     Arrival date & time 11/20/15  0545 History   First MD Initiated Contact with Patient 11/20/15 [phone]     Chief Complaint  Patient presents with  . Shortness of Breath  . Chest Pain    chest thighness      (Consider location/radiation/quality/duration/timing/severity/associated sxs/prior Treatment) HPI   Blood pressure 132/97, pulse 87, temperature 98 F (36.7 C), temperature source Oral, resp. rate 18, height 5\' 9"  (1.753 m), weight 50.349 kg, SpO2 91 %.  Anthony Bolton is a 65 y.o. male complaining of generalized chest tight tightness with shortness of breath worsening over the course of 2 days. Patient states that he has run out of his inhaler and nebulizer solution several days ago. has appointment with PCP tomorrow but states he couldn't wait. He has a cough productive of sputum which is not normal for him he also has associated rhinorrhea. Patient denies fever, chills, increasing peripheral edema.   Past Medical History  Diagnosis Date  . COPD (chronic obstructive pulmonary disease) ([place])   . HOH (hard of hearing)   . CHF (congestive heart failure) (HCC) EF 20 - 25% in 2015  . HLD (hyperlipidemia)    Past Surgical History  Procedure Laterality Date  . Appendectomy    . Left heart catheterization with coronary angiogram N/A 11/07/2013    Procedure: LEFT HEART CATHETERIZATION WITH CORONARY ANGIOGRAM;  Surgeon: Clent Demark, MD;  Location: [facility] CATH LAB;  Service: Cardiovascular;  Laterality: N/A;  . Cardiac stents      No stent history   Family History  Problem Relation Age of Onset  . Stroke Mother    Social History  Substance Use Topics  . Smoking status: Current Every Day Smoker -- 0.50 packs/day    Types: Cigarettes  . Smokeless tobacco: Never Used  . Alcohol Use: No    Review of Systems  10 systems reviewed and found to be negative, except as noted in the HPI.  Allergies  Review of patient's allergies indicates no known allergies.  Home  Medications   Prior to Admission medications   Medication Sig Start Date End Date Taking? Authorizing Provider  albuterol (PROVENTIL HFA;VENTOLIN HFA) 108 (90 Base) MCG/ACT inhaler Inhale 2 puffs into the lungs every 6 (six) hours as needed for wheezing or shortness of breath.   Yes Historical Provider, MD  albuterol (PROVENTIL) (2.5 MG/3ML) 0.083% nebulizer solution Take 6 mLs (5 mg total) by nebulization every 6 (six) hours as needed for wheezing or shortness of breath. 08/25/15  Yes Larene Pickett, PA-C  ibuprofen (ADVIL,MOTRIN) 200 MG tablet Take 400 mg by mouth every 6 (six) hours as needed for headache, mild pain or moderate pain.   Yes Historical Provider, MD  aspirin EC 81 MG EC tablet Take 1 tablet (81 mg total) by mouth daily. Patient not taking: Reported on 11/08/2015 03/04/15   Velta Addison Mikhail, DO  atorvastatin (LIPITOR) 40 MG tablet Take 1 tablet (40 mg total) by mouth at bedtime. Patient not taking: Reported on 11/08/2015 03/04/15   Velta Addison Mikhail, DO  budesonide-formoterol (SYMBICORT) 160-4.5 MCG/ACT inhaler Inhale 2 puffs into the lungs 2 (two) times daily. Patient not taking: Reported on 11/08/2015 08/13/15   Thurnell Lose, MD  levofloxacin (LEVAQUIN) 750 MG tablet Take 1 tablet (750 mg total) by mouth daily. X 7 days 11/20/15   Monico Blitz, PA-C  methimazole (TAPAZOLE) 10 MG tablet Take 1 tablet (10 mg total) by mouth 2 (two) times daily. Patient not  taking: Reported on 11/08/2015 10/14/15   Nishant Dhungel, MD  Multiple Vitamin (MULTIVITAMIN WITH MINERALS) TABS tablet Take 1 tablet by mouth daily. Patient not taking: Reported on 11/08/2015 03/04/15   Velta Addison Mikhail, DO  polyethylene glycol [facility]) packet Take 17 g by mouth daily as needed. Patient not taking: Reported on 11/20/2015 10/14/15   Nishant Dhungel, MD  tiotropium (SPIRIVA HANDIHALER) 18 MCG inhalation capsule Place 1 capsule (18 mcg total) into inhaler and inhale daily. Patient not taking: Reported on 11/20/2015 11/08/15    [facility] Ward, PA-C   BP 128/96 mmHg  Pulse 83  Temp(Src) 98 F (36.7 C) (Oral)  Resp 16  Ht 5\' 9"  (1.753 m)  Wt 50.349 kg  BMI 16.38 kg/m2  SpO2 89% Physical Exam  Constitutional: He is oriented to person, place, and time. He appears well-developed.  cachectic  HENT:  Head: Normocephalic and atraumatic.  Mouth/Throat: Oropharynx is clear and moist.  Eyes: Conjunctivae and EOM are normal. Pupils are equal, round, and reactive to light.  Cardiovascular: Normal rate, regular rhythm and intact distal pulses.   Pulmonary/Chest: Effort normal. No stridor. No respiratory distress. He has no wheezes. He has no rales. He exhibits no tenderness.  No wheezing, slightly reduced or movement in all fields. Patient is reclining speaking in complete sentences comfortably.  Abdominal: Soft. He exhibits no distension and no mass. There is no tenderness. There is no rebound and no guarding.  Musculoskeletal: Normal range of motion. He exhibits no edema or tenderness.  Neurological: He is alert and oriented to person, place, and time.  Skin: Skin is warm.  Psychiatric: He has a normal mood and affect.  Nursing note and vitals reviewed.   ED Course  Procedures (including critical care time) Labs Review Labs Reviewed  BASIC METABOLIC PANEL - Abnormal; Notable for the following:    BUN 24 (*)    All other components within normal limits  CBC WITH DIFFERENTIAL/PLATELET - Abnormal; Notable for the following:    Neutro Abs 9.1 (*)    Monocytes Absolute 0.0 (*)    All other components within normal limits  BRAIN NATRIURETIC PEPTIDE  I-STAT TROPOININ, ED    Imaging Review Dg Chest 2 View  11/20/2015  CLINICAL DATA:  Acute onset of worsening shortness of breath and chest tightness. Initial encounter. EXAM: CHEST  2 VIEW COMPARISON:  Chest radiograph performed 11/08/2015 FINDINGS: The lungs are hyperexpanded, with flattening of the hemidiaphragms, compatible with COPD. Peribronchial  thickening is noted. Mildly increased interstitial markings appear chronic in nature. There is no definite evidence of superimposed focal opacification, pleural effusion or pneumothorax. The heart is normal in size; the mediastinal contour is within normal limits. No acute osseous abnormalities are seen. IMPRESSION: Findings of COPD. No definite superimposed airspace consolidation, though minimal right basilar opacity cannot be entirely excluded, depending on the patient's symptoms. Electronically Signed   By: Garald Balding M.D.   On: 11/20/2015 06:51   I have personally reviewed and evaluated these images and lab results as part of my medical decision-making.   EKG Interpretation   Date/Time:  Wednesday November 20 2015 06:24:34 EST Ventricular Rate:  82 PR Interval:  159 QRS Duration: 88 QT Interval:  369 QTC Calculation: 431 R Axis:   34 Text Interpretation:  Sinus rhythm Nonspecific T abnrm, anterolateral  leads No significant change since last tracing Confirmed by KNOTT MD,  DANIEL [id number]) on 11/20/2015 8:33:24 AM      MDM   Final diagnoses:  COPD exacerbation (  [place])    Filed Vitals:   11/20/15 0549 11/20/15 0552 11/20/15 0628 11/20/15 0813  BP: 132/97   128/96  Pulse: 87   83  Temp: 98 F (36.7 C)     TempSrc: Oral     Resp: 18   16  Height: 5\' 9"  (1.753 m)     Weight: 50.349 kg     SpO2: 96% 100% 91% 89%    Medications  methylPREDNISolone sodium succinate (SOLU-MEDROL) 125 mg/2 mL injection 125 mg (125 mg Intravenous Given 11/20/15 0649)  ipratropium-albuterol (DUONEB) 0.5-2.5 (3) MG/3ML nebulizer solution 3 mL (3 mLs Nebulization Given 11/20/15 0653)  albuterol (PROVENTIL) (2.5 MG/3ML) 0.083% nebulizer solution 5 mg (5 mg Nebulization Given 11/20/15 0813)  albuterol (PROVENTIL HFA;VENTOLIN HFA) 108 (90 Base) MCG/ACT inhaler 2 puff (2 puffs Inhalation Given 11/20/15 0814)  levofloxacin (LEVAQUIN) tablet 750 mg (750 mg Oral Given 11/20/15 1019)    Anthony Bolton is 65  y.o. male presenting with shortness of breath, he ran out of his inhaler every days ago. Patient is afebrile, saturating adequately on room air at around 90%. No home oxygen. Patient given Solu-Medrol and DuoNeb with good relief of his symptoms.  Chest x-ray without defined infiltrate, troponin negative, Pro BNP negative EKG with no changes.   We'll treat for COPD exacerbation with Levaquin, patient given an inhaler to take home comments critically important that he follows with his primary care tomorrow for outpatient management. Patient ambulated without issue. On my last assessment of this patient's prior to discharge his O2 saturation was greater than 90%.  This is a shared visit with the attending physician who personally evaluated the patient and agrees with the care plan.   Evaluation does not show pathology that would require ongoing emergent intervention or inpatient treatment. Pt is hemodynamically stable and mentating appropriately. Discussed findings and plan with patient/guardian, who agrees with care plan. All questions answered. Return precautions discussed and outpatient follow up given.   New Prescriptions   LEVOFLOXACIN (LEVAQUIN) 750 MG TABLET    Take 1 tablet (750 mg total) by mouth daily. X 7 days         Monico Blitz, PA-C 11/20/15 1020  Leo Grosser, MD 11/20/15 2211

## 2015-11-27 DIAGNOSIS — D3502 Benign neoplasm of left adrenal gland: Secondary | ICD-10-CM

## 2015-11-27 HISTORY — DX: Benign neoplasm of left adrenal gland: D35.02

## 2015-12-02 ENCOUNTER — Emergency Department (HOSPITAL_COMMUNITY): Payer: Medicare Other

## 2015-12-02 ENCOUNTER — Encounter (HOSPITAL_COMMUNITY): Payer: Self-pay

## 2015-12-02 ENCOUNTER — Inpatient Hospital Stay (HOSPITAL_COMMUNITY)
Admission: EM | Admit: 2015-12-02 | Discharge: 2015-12-07 | DRG: 640 | Disposition: A | Discharge status: Nursing Home (Includes ICF) | Payer: Medicare Other | Attending: Internal Medicine | Admitting: Internal Medicine

## 2015-12-02 DIAGNOSIS — Z823 Family history of stroke: Secondary | ICD-10-CM | POA: Diagnosis not present

## 2015-12-02 DIAGNOSIS — R1084 Generalized abdominal pain: Secondary | ICD-10-CM | POA: Diagnosis not present

## 2015-12-02 DIAGNOSIS — E0590 Thyrotoxicosis, unspecified without thyrotoxic crisis or storm: Secondary | ICD-10-CM | POA: Diagnosis present

## 2015-12-02 DIAGNOSIS — E872 Acidosis: Secondary | ICD-10-CM | POA: Diagnosis present

## 2015-12-02 DIAGNOSIS — E46 Unspecified protein-calorie malnutrition: Secondary | ICD-10-CM | POA: Diagnosis not present

## 2015-12-02 DIAGNOSIS — R627 Adult failure to thrive: Secondary | ICD-10-CM | POA: Diagnosis present

## 2015-12-02 DIAGNOSIS — J42 Unspecified chronic bronchitis: Secondary | ICD-10-CM

## 2015-12-02 DIAGNOSIS — R7989 Other specified abnormal findings of blood chemistry: Secondary | ICD-10-CM | POA: Diagnosis not present

## 2015-12-02 DIAGNOSIS — D72829 Elevated white blood cell count, unspecified: Secondary | ICD-10-CM | POA: Diagnosis present

## 2015-12-02 DIAGNOSIS — E785 Hyperlipidemia, unspecified: Secondary | ICD-10-CM | POA: Diagnosis present

## 2015-12-02 DIAGNOSIS — M6282 Rhabdomyolysis: Secondary | ICD-10-CM | POA: Diagnosis present

## 2015-12-02 DIAGNOSIS — H9193 Unspecified hearing loss, bilateral: Secondary | ICD-10-CM | POA: Diagnosis not present

## 2015-12-02 DIAGNOSIS — E876 Hypokalemia: Secondary | ICD-10-CM | POA: Diagnosis present

## 2015-12-02 DIAGNOSIS — T730XXS Starvation, sequela: Secondary | ICD-10-CM | POA: Diagnosis present

## 2015-12-02 DIAGNOSIS — E8889 Other specified metabolic disorders: Secondary | ICD-10-CM | POA: Diagnosis present

## 2015-12-02 DIAGNOSIS — T730XXA Starvation, initial encounter: Secondary | ICD-10-CM | POA: Diagnosis present

## 2015-12-02 DIAGNOSIS — R7401 Elevation of levels of liver transaminase levels: Secondary | ICD-10-CM

## 2015-12-02 DIAGNOSIS — R109 Unspecified abdominal pain: Secondary | ICD-10-CM

## 2015-12-02 DIAGNOSIS — R404 Transient alteration of awareness: Secondary | ICD-10-CM | POA: Diagnosis not present

## 2015-12-02 DIAGNOSIS — R740 Nonspecific elevation of levels of transaminase and lactic acid dehydrogenase [LDH]: Secondary | ICD-10-CM | POA: Diagnosis present

## 2015-12-02 DIAGNOSIS — E440 Moderate protein-calorie malnutrition: Secondary | ICD-10-CM

## 2015-12-02 DIAGNOSIS — Z681 Body mass index (BMI) 19 or less, adult: Secondary | ICD-10-CM

## 2015-12-02 DIAGNOSIS — N289 Disorder of kidney and ureter, unspecified: Secondary | ICD-10-CM | POA: Diagnosis not present

## 2015-12-02 DIAGNOSIS — K56609 Unspecified intestinal obstruction, unspecified as to partial versus complete obstruction: Secondary | ICD-10-CM

## 2015-12-02 DIAGNOSIS — I5022 Chronic systolic (congestive) heart failure: Secondary | ICD-10-CM | POA: Diagnosis not present

## 2015-12-02 DIAGNOSIS — J449 Chronic obstructive pulmonary disease, unspecified: Secondary | ICD-10-CM | POA: Diagnosis present

## 2015-12-02 DIAGNOSIS — K5641 Fecal impaction: Secondary | ICD-10-CM | POA: Diagnosis present

## 2015-12-02 DIAGNOSIS — F17210 Nicotine dependence, cigarettes, uncomplicated: Secondary | ICD-10-CM | POA: Diagnosis present

## 2015-12-02 DIAGNOSIS — X58XXXA Exposure to other specified factors, initial encounter: Secondary | ICD-10-CM | POA: Diagnosis present

## 2015-12-02 DIAGNOSIS — E43 Unspecified severe protein-calorie malnutrition: Secondary | ICD-10-CM | POA: Diagnosis present

## 2015-12-02 DIAGNOSIS — R531 Weakness: Secondary | ICD-10-CM | POA: Diagnosis not present

## 2015-12-02 DIAGNOSIS — K5900 Constipation, unspecified: Secondary | ICD-10-CM | POA: Diagnosis not present

## 2015-12-02 DIAGNOSIS — R509 Fever, unspecified: Secondary | ICD-10-CM | POA: Diagnosis not present

## 2015-12-02 DIAGNOSIS — H9190 Unspecified hearing loss, unspecified ear: Secondary | ICD-10-CM | POA: Diagnosis present

## 2015-12-02 DIAGNOSIS — D649 Anemia, unspecified: Secondary | ICD-10-CM | POA: Diagnosis present

## 2015-12-02 DIAGNOSIS — E860 Dehydration: Secondary | ICD-10-CM | POA: Diagnosis not present

## 2015-12-02 DIAGNOSIS — M7989 Other specified soft tissue disorders: Secondary | ICD-10-CM | POA: Diagnosis not present

## 2015-12-02 DIAGNOSIS — R0602 Shortness of breath: Secondary | ICD-10-CM | POA: Diagnosis not present

## 2015-12-02 DIAGNOSIS — E8729 Other acidosis: Secondary | ICD-10-CM | POA: Diagnosis present

## 2015-12-02 DIAGNOSIS — R945 Abnormal results of liver function studies: Secondary | ICD-10-CM

## 2015-12-02 LAB — COMPREHENSIVE METABOLIC PANEL
ALK PHOS: 58 U/L (ref 38–126)
ALT: 30 U/L (ref 17–63)
AST: 170 U/L — AB (ref 15–41)
Albumin: 3.9 g/dL (ref 3.5–5.0)
Anion gap: 16 — ABNORMAL HIGH (ref 5–15)
BILIRUBIN TOTAL: 0.4 mg/dL (ref 0.3–1.2)
BUN: 37 mg/dL — AB (ref 6–20)
CO2: 17 mmol/L — ABNORMAL LOW (ref 22–32)
CREATININE: 1 mg/dL (ref 0.61–1.24)
Calcium: 8.8 mg/dL — ABNORMAL LOW (ref 8.9–10.3)
Chloride: 101 mmol/L (ref 101–111)
GFR calc Af Amer: >60 mL/min (ref >60)
Glucose, Bld: 93 mg/dL (ref 65–99)
Potassium: 4.5 mmol/L (ref 3.5–5.1)
Sodium: 134 mmol/L — ABNORMAL LOW (ref 135–145)
TOTAL PROTEIN: 6.9 g/dL (ref 6.5–8.1)

## 2015-12-02 LAB — PHOSPHORUS: PHOSPHORUS: 4.4 mg/dL (ref 2.5–4.6)

## 2015-12-02 LAB — LACTIC ACID, PLASMA: LACTIC ACID, VENOUS: 1.7 mmol/L (ref 0.5–2.0)

## 2015-12-02 LAB — CBC
HCT: 44.4 % (ref 39.0–52.0)
HEMOGLOBIN: 14.7 g/dL (ref 13.0–17.0)
MCH: 29.4 pg (ref 26.0–34.0)
MCHC: 33.1 g/dL (ref 30.0–36.0)
MCV: 88.8 fL (ref 78.0–100.0)
PLATELETS: 215 10*3/uL (ref 150–400)
RBC: 5 MIL/uL (ref 4.22–5.81)
RDW: 13.9 % (ref 11.5–15.5)
WBC: 21.5 10*3/uL — ABNORMAL HIGH (ref 4.0–10.5)

## 2015-12-02 LAB — CBG MONITORING, ED: Glucose-Capillary: 90 mg/dL (ref 65–99)

## 2015-12-02 LAB — MAGNESIUM: MAGNESIUM: 2 mg/dL (ref 1.7–2.4)

## 2015-12-02 LAB — TSH: TSH: 0.052 u[IU]/mL — AB (ref 0.350–4.500)

## 2015-12-02 MED ORDER — ALBUTEROL SULFATE HFA 108 (90 BASE) MCG/ACT IN AERS: 2.0000 | INHALATION_SPRAY | Freq: Four times a day (QID) | RESPIRATORY_TRACT | Status: DC | PRN

## 2015-12-02 MED ORDER — HEPARIN SODIUM (PORCINE) 5000 UNIT/ML IJ SOLN
5000.0000 [IU] | Freq: Three times a day (TID) | INTRAMUSCULAR | Status: DC
  Administered 2015-12-02 – 2015-12-07 (×10): 5000 [IU] via SUBCUTANEOUS
  Filled 2015-12-02 (×12): qty 1

## 2015-12-02 MED ORDER — METHIMAZOLE 10 MG PO TABS
10.0000 mg | ORAL_TABLET | Freq: Two times a day (BID) | ORAL | Status: DC
  Administered 2015-12-02 – 2015-12-07 (×10): 10 mg via ORAL
  Filled 2015-12-02 (×11): qty 1

## 2015-12-02 MED ORDER — IBUPROFEN 200 MG PO TABS: 400.0000 mg | ORAL_TABLET | Freq: Four times a day (QID) | ORAL | Status: DC | PRN

## 2015-12-02 MED ORDER — ASPIRIN EC 81 MG PO TBEC
81.0000 mg | DELAYED_RELEASE_TABLET | Freq: Every day | ORAL | Status: DC
  Administered 2015-12-02 – 2015-12-07 (×6): 81 mg via ORAL
  Filled 2015-12-02 (×7): qty 1

## 2015-12-02 MED ORDER — IPRATROPIUM-ALBUTEROL 0.5-2.5 (3) MG/3ML IN SOLN
3.0000 mL | Freq: Once | RESPIRATORY_TRACT | Status: AC
  Administered 2015-12-02: 3 mL via RESPIRATORY_TRACT
  Filled 2015-12-02: qty 3

## 2015-12-02 MED ORDER — THIAMINE HCL 100 MG/ML IJ SOLN
200.0000 mg | Freq: Once | INTRAMUSCULAR | Status: AC
  Administered 2015-12-02: 200 mg via INTRAVENOUS
  Filled 2015-12-02: qty 2

## 2015-12-02 MED ORDER — ALBUTEROL SULFATE (2.5 MG/3ML) 0.083% IN NEBU
5.0000 mg | INHALATION_SOLUTION | Freq: Four times a day (QID) | RESPIRATORY_TRACT | Status: DC | PRN
  Administered 2015-12-03 (×2): 5 mg via RESPIRATORY_TRACT
  Filled 2015-12-02 (×2): qty 6

## 2015-12-02 MED ORDER — SODIUM CHLORIDE 0.9 % IV BOLUS (SEPSIS)
2000.0000 mL | Freq: Once | INTRAVENOUS | Status: AC
  Administered 2015-12-02: 2000 mL via INTRAVENOUS

## 2015-12-02 MED ORDER — ADULT MULTIVITAMIN W/MINERALS CH
1.0000 | ORAL_TABLET | Freq: Every day | ORAL | Status: DC
  Administered 2015-12-02 – 2015-12-07 (×6): 1 via ORAL
  Filled 2015-12-02 (×6): qty 1

## 2015-12-02 MED ORDER — ATORVASTATIN CALCIUM 40 MG PO TABS
40.0000 mg | ORAL_TABLET | Freq: Every day | ORAL | Status: DC
  Administered 2015-12-02: 40 mg via ORAL
  Filled 2015-12-02 (×2): qty 1

## 2015-12-02 MED ORDER — SODIUM CHLORIDE 0.9 % IV SOLN
INTRAVENOUS | Status: DC
  Administered 2015-12-02: 21:00:00 via INTRAVENOUS
  Administered 2015-12-03: 1000 mL via INTRAVENOUS
  Administered 2015-12-03: 05:00:00 via INTRAVENOUS

## 2015-12-02 MED ORDER — MOMETASONE FURO-FORMOTEROL FUM 200-5 MCG/ACT IN AERO
2.0000 | INHALATION_SPRAY | Freq: Two times a day (BID) | RESPIRATORY_TRACT | Status: DC
  Administered 2015-12-02 – 2015-12-07 (×5): 2 via RESPIRATORY_TRACT
  Filled 2015-12-02 (×2): qty 8.8

## 2015-12-02 MED ORDER — TIOTROPIUM BROMIDE MONOHYDRATE 18 MCG IN CAPS
18.0000 ug | ORAL_CAPSULE | Freq: Every day | RESPIRATORY_TRACT | Status: DC
  Filled 2015-12-02: qty 5

## 2015-12-02 NOTE — ED Notes (Signed)
X-ray at bedside

## 2015-12-02 NOTE — ED Notes (Signed)
Per [facility]- Pt called c/o of weakness. Pt ambulatory on scene. Pt alert and active with care. Active bed bugs on scene. Neg for stroke

## 2015-12-02 NOTE — ED Notes (Signed)
Patient made aware that urine sample is needed. Urinal left at bedside.

## 2015-12-02 NOTE — ED Notes (Signed)
Pt states he has had a fever, weakness and shortness of breath for several days. Pt has been unable to get out for food. Last ate 2 days ago states ate just 2 pieces of bread with cheese. Last drank water yesterday. Denies ETOH

## 2015-12-02 NOTE — ED Notes (Signed)
MD at bedside. 

## 2015-12-02 NOTE — ED Notes (Signed)
Pt. Is unable to use the restroom at this time, aware that we need a urine specimen.  

## 2015-12-02 NOTE — H&P (Signed)
[facility] History and Physical  Anthony Bolton [id number] DOB: May 18, 1951 DOA: 12/02/2015  Referring physician: EDP PCP: [name], MD   Chief Complaint: Generalized weakness, bed bugs   HPI: Anthony Bolton is a 65 y.o. male who presents to the ED with inability to ambulate for the past 3 days.  He is unable to stand up and get food.  Also has SOB due to no access to home nebulizer.  He has been off of several meds due to running out of these including tapazole.  He is very dehydrated and hungry he reports on arrival.  No vomiting, no fever, minimal abdominal pain but no worse than usual.  Review of Systems: Systems reviewed.  As above, otherwise negative  Past Medical History  Diagnosis Date  . COPD (chronic obstructive pulmonary disease) ([place])   . HOH (hard of hearing)   . CHF (congestive heart failure) (HCC) EF 20 - 25% in 2015  . HLD (hyperlipidemia)    Past Surgical History  Procedure Laterality Date  . Appendectomy    . Left heart catheterization with coronary angiogram N/A 11/07/2013    Procedure: LEFT HEART CATHETERIZATION WITH CORONARY ANGIOGRAM;  Surgeon: Clent Demark, MD;  Location: [facility] CATH LAB;  Service: Cardiovascular;  Laterality: N/A;  . Cardiac stents      No stent history   Social History:  reports that he has been smoking Cigarettes.  He has been smoking about 0.50 packs per day. He has never used smokeless tobacco. He reports that he does not drink alcohol or use illicit drugs.  No Known Allergies  Family History  Problem Relation Age of Onset  . Stroke Mother      Prior to Admission medications   Medication Sig Start Date End Date Taking? Authorizing Provider  albuterol (PROVENTIL HFA;VENTOLIN HFA) 108 (90 Base) MCG/ACT inhaler Inhale 2 puffs into the lungs every 6 (six) hours as needed for wheezing or shortness of breath.    Historical Provider, MD  albuterol (PROVENTIL) (2.5 MG/3ML) 0.083% nebulizer solution Take 6 mLs (5 mg total)  by nebulization every 6 (six) hours as needed for wheezing or shortness of breath. 08/25/15   Larene Pickett, PA-C  aspirin EC 81 MG EC tablet Take 1 tablet (81 mg total) by mouth daily. Patient not taking: Reported on 11/08/2015 03/04/15   Velta Addison Mikhail, DO  atorvastatin (LIPITOR) 40 MG tablet Take 1 tablet (40 mg total) by mouth at bedtime. Patient not taking: Reported on 11/08/2015 03/04/15   Velta Addison Mikhail, DO  budesonide-formoterol (SYMBICORT) 160-4.5 MCG/ACT inhaler Inhale 2 puffs into the lungs 2 (two) times daily. Patient not taking: Reported on 11/08/2015 08/13/15   Thurnell Lose, MD  ibuprofen (ADVIL,MOTRIN) 200 MG tablet Take 400 mg by mouth every 6 (six) hours as needed for headache, mild pain or moderate pain.    Historical Provider, MD  levofloxacin (LEVAQUIN) 750 MG tablet Take 1 tablet (750 mg total) by mouth daily. X 7 days 11/20/15   Monico Blitz, PA-C  methimazole (TAPAZOLE) 10 MG tablet Take 1 tablet (10 mg total) by mouth 2 (two) times daily. Patient not taking: Reported on 11/08/2015 10/14/15   Flonnie Overman Dhungel, MD  Multiple Vitamin (MULTIVITAMIN WITH MINERALS) TABS tablet Take 1 tablet by mouth daily. Patient not taking: Reported on 11/08/2015 03/04/15   Velta Addison Mikhail, DO  polyethylene glycol [facility]) packet Take 17 g by mouth daily as needed. Patient not taking: Reported on 11/20/2015 10/14/15   Louellen Molder, MD  tiotropium (  SPIRIVA HANDIHALER) 18 MCG inhalation capsule Place 1 capsule (18 mcg total) into inhaler and inhale daily. Patient not taking: Reported on 11/20/2015 11/08/15   Ozella Almond Ward, PA-C   Physical Exam: Danley Danker Vitals:   12/02/15 2045 12/02/15 2059  BP: 100/72   Pulse: 90 95  Temp:    Resp: 17 17    BP 100/72 mmHg  Pulse 95  Temp(Src) 98.4 F (36.9 C) (Rectal)  Resp 17  Ht 5\' 9"  (1.753 m)  Wt 48.535 kg (107 lb)  BMI 15.79 kg/m2  SpO2 95%  General Appearance:    Alert, oriented, no distress, appears stated age  Head:    Normocephalic,  atraumatic  Eyes:    PERRL, EOMI, sclera non-icteric        Nose:   Nares without drainage or epistaxis. Mucosa, turbinates normal  Throat:   Moist mucous membranes. Oropharynx without erythema or exudate.  Neck:   Supple. No carotid bruits.  No thyromegaly.  No lymphadenopathy.   Back:     No CVA tenderness, no spinal tenderness  Lungs:     Clear to auscultation bilaterally, without wheezes, rhonchi or rales  Chest wall:    No tenderness to palpitation  Heart:    Regular rate and rhythm without murmurs, gallops, rubs  Abdomen:     Soft, non-tender, nondistended, normal bowel sounds, no organomegaly  Genitalia:    deferred  Rectal:    deferred  Extremities:   No clubbing, cyanosis or edema.  Pulses:   2+ and symmetric all extremities  Skin:   Skin color, texture, turgor normal, no rashes or lesions  Lymph nodes:   Cervical, supraclavicular, and axillary nodes normal  Neurologic:   CNII-XII intact. Normal strength, sensation and reflexes      throughout    Labs on Admission:  Basic Metabolic Panel:  Recent Labs Lab 12/02/15 1805  NA 134*  K 4.5  CL 101  CO2 17*  GLUCOSE 93  BUN 37*  CREATININE 1.00  CALCIUM 8.8*   Liver Function Tests:  Recent Labs Lab 12/02/15 1805  AST 170*  ALT 30  ALKPHOS 58  BILITOT 0.4  PROT 6.9  ALBUMIN 3.9   No results for input(s): LIPASE, AMYLASE in the last 168 hours. No results for input(s): AMMONIA in the last 168 hours. CBC:  Recent Labs Lab 12/02/15 1805  WBC 21.5*  HGB 14.7  HCT 44.4  MCV 88.8  PLT 215   Cardiac Enzymes: No results for input(s): CKTOTAL, CKMB, CKMBINDEX, TROPONINI in the last 168 hours.  BNP (last 3 results) No results for input(s): PROBNP in the last 8760 hours. CBG:  Recent Labs Lab 12/02/15 1809  GLUCAP 90    Radiological Exams on Admission: Dg Chest 2 View  12/02/2015  CLINICAL DATA:  Shortness of breath. EXAM: CHEST  2 VIEW COMPARISON:  11/20/2015 and 11/08/2015. FINDINGS: The heart size  and mediastinal contours are stable. The lungs remain hyperinflated with flattening of the diaphragm. No superimposed airspace disease, pleural effusion or pneumothorax demonstrated. The bones appear unchanged. IMPRESSION: Stable chronic findings of moderate COPD. No acute superimposed process. Electronically Signed   By: Richardean Sale M.D.   On: 12/02/2015 19:01    EKG: Independently reviewed.  Assessment/Plan Principal Problem:   Malnutrition due to starvation [facility]) Active Problems:   COPD (chronic obstructive pulmonary disease) (HCC)   Chronic systolic congestive heart failure, NYHA class 3 (HCC)   Malnutrition of moderate degree (HCC)   Hard of hearing  High anion gap metabolic acidosis   Leukocytosis   1. Malnutrition due to starvation - 1. Lab work shows NAG metabolic acidosis, this is suspicious for starvation ketosis 2. Urine keytones and lactate are pending 3. IVF to treat dehydration 4. PO4 and Mg labs pending 5. Denies any recent vomiting, minimal abdominal pain is described as hunger, and is already eating in room so doubt SBO at this time (extensive history of this is noted though). 2. Leukocytosis - 1. No obvious source of infection at this point, no fever 2. Monitor with repeat CBC in AM 3. 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 TSH and T4 pending to confirm 2. Resume tapazole 3. Does not appear to be in storm at this time    Code Status: Full  Family Communication: No family in room Disposition Plan: admit to obs   Time spent: 70 min  Lari Linson M. [facility] Pager [phone]  If 7AM-7PM, please contact the day team taking care of the patient [email or web address] Password [email or web address] 12/02/2015, 9:06 PM

## 2015-12-02 NOTE — ED Provider Notes (Addendum)
CSN: [id number]     Arrival date & time 12/02/15  1706 History   First MD Initiated Contact with Patient 12/02/15 1807     Chief Complaint  Patient presents with  . Weakness  . BED BUGS      (Consider location/radiation/quality/duration/timing/severity/associated sxs/prior Treatment) HPI Complains of generalized weakness for the past 3 days. Patient unable to stand up to walk unable to get food. Also complains of shortness of breath. And he has not had access to his home nebulizer.. Denies other complaint. Paramedics noted bed bugs at patient's residence. Patient reports that he is presently hungry and thirsty.  No known fever. No other associated symptoms Past Medical History  Diagnosis Date  . COPD (chronic obstructive pulmonary disease) ([facility])   . HOH (hard of hearing)   . CHF (congestive heart failure) (HCC) EF 20 - 25% in 2015  . HLD (hyperlipidemia)    Past Surgical History  Procedure Laterality Date  . Appendectomy    . Left heart catheterization with coronary angiogram N/A 11/07/2013    Procedure: LEFT HEART CATHETERIZATION WITH CORONARY ANGIOGRAM;  Surgeon: Clent Demark, MD;  Location: [facility] CATH LAB;  Service: Cardiovascular;  Laterality: N/A;  . Cardiac stents      No stent history   Family History  Problem Relation Age of Onset  . Stroke Mother    Social History  Substance Use Topics  . Smoking status: Current Every Day Smoker -- 0.50 packs/day    Types: Cigarettes  . Smokeless tobacco: Never Used  . Alcohol Use: No    Review of Systems  HENT: Negative.   Respiratory: Positive for shortness of breath.   Cardiovascular: Negative.   Gastrointestinal: Negative.   Musculoskeletal: Negative.   Skin: Negative.   Neurological: Positive for weakness.       Generalized weakness  Psychiatric/Behavioral: Negative.   All other systems reviewed and are negative.     Allergies  Review of patient's allergies indicates no known allergies.  Home Medications   Prior  to Admission medications   Medication Sig Start Date End Date Taking? Authorizing Provider  albuterol (PROVENTIL HFA;VENTOLIN HFA) 108 (90 Base) MCG/ACT inhaler Inhale 2 puffs into the lungs every 6 (six) hours as needed for wheezing or shortness of breath.    Historical Provider, MD  albuterol (PROVENTIL) (2.5 MG/3ML) 0.083% nebulizer solution Take 6 mLs (5 mg total) by nebulization every 6 (six) hours as needed for wheezing or shortness of breath. 08/25/15   Larene Pickett, PA-C  aspirin EC 81 MG EC tablet Take 1 tablet (81 mg total) by mouth daily. Patient not taking: Reported on 11/08/2015 03/04/15   Velta Addison Mikhail, DO  atorvastatin (LIPITOR) 40 MG tablet Take 1 tablet (40 mg total) by mouth at bedtime. Patient not taking: Reported on 11/08/2015 03/04/15   Velta Addison Mikhail, DO  budesonide-formoterol (SYMBICORT) 160-4.5 MCG/ACT inhaler Inhale 2 puffs into the lungs 2 (two) times daily. Patient not taking: Reported on 11/08/2015 08/13/15   Thurnell Lose, MD  ibuprofen (ADVIL,MOTRIN) 200 MG tablet Take 400 mg by mouth every 6 (six) hours as needed for headache, mild pain or moderate pain.    Historical Provider, MD  levofloxacin (LEVAQUIN) 750 MG tablet Take 1 tablet (750 mg total) by mouth daily. X 7 days 11/20/15   Monico Blitz, PA-C  methimazole (TAPAZOLE) 10 MG tablet Take 1 tablet (10 mg total) by mouth 2 (two) times daily. Patient not taking: Reported on 11/08/2015 10/14/15   Louellen Molder, MD  Multiple Vitamin (MULTIVITAMIN WITH MINERALS) TABS tablet Take 1 tablet by mouth daily. Patient not taking: Reported on 11/08/2015 03/04/15   Velta Addison Mikhail, DO  polyethylene glycol [facility]) packet Take 17 g by mouth daily as needed. Patient not taking: Reported on 11/20/2015 10/14/15   Nishant Dhungel, MD  tiotropium (SPIRIVA HANDIHALER) 18 MCG inhalation capsule Place 1 capsule (18 mcg total) into inhaler and inhale daily. Patient not taking: Reported on 11/20/2015 11/08/15   [facility], PA-C    BP 90/79 mmHg  Pulse 98  Temp(Src) 98.3 F (36.8 C) (Oral)  Resp 20  Ht 5\' 9"  (1.753 m)  Wt 107 lb (48.535 kg)  BMI 15.79 kg/m2  SpO2 93% Physical Exam  Constitutional: He is oriented to person, place, and time.  cachecticchronically ill-appearing  HENT:  Head: Normocephalic and atraumatic.  Eyes: Conjunctivae are normal. Pupils are equal, round, and reactive to light.  Neck: Neck supple. No tracheal deviation present. No thyromegaly present.  Cardiovascular: Normal rate and regular rhythm.   No murmur heard. Pulmonary/Chest: Effort normal and breath sounds normal.  Abdominal: Soft. Bowel sounds are normal. He exhibits no distension. There is no tenderness.  Musculoskeletal: Normal range of motion. He exhibits no edema or tenderness.  Neurological: He is alert and oriented to person, place, and time. No cranial nerve deficit. Coordination normal.  He comes very lightheaded on standing, unable to stand due to lightheadedness  Skin: Skin is warm and dry. No rash noted.  Psychiatric: He has a normal mood and affect.  Nursing note and vitals reviewed.   ED Course  Procedures (including critical care time) Labs Review Labs Reviewed  CBC  URINALYSIS, ROUTINE W REFLEX MICROSCOPIC (NOT AT [facility])  COMPREHENSIVE METABOLIC PANEL  CBG MONITORING, ED    Imaging Review No results found. I have personally reviewed and evaluated these images and lab results as part of my medical decision-making.   EKG Interpretation None     ED ECG REPORT   Date: 12/03/2015  Rate: 95  Rhythm: normal sinus rhythm  QRS Axis: normal  Intervals: normal  ST/T Wave abnormalities: nonspecific T wave changes  Conduction Disutrbances:none  Narrative Interpretation:   Old EKG Reviewed: unchanged unchanged from 11/20/2015 I have personally reviewed the EKG tracing and disagree with the computerized printout as noted.  Rhythm is normal sinus rhythm not atrial flutter Patient ate a meal in the  emergency department at 8:20 PM he is resting comfortable. Chest xray viewed by me MDM  Patient felt to be severely dehydrated.Dr Alcario Drought consulted and made arrangement for admission Final diagnoses:  None  Dx #1 dehydration #2 renal uinsufficiency  #3 leukocytosis    Orlie Dakin, MD 12/02/15 2028  Orlie Dakin, MD 12/03/15 [phone]

## 2015-12-02 NOTE — ED Notes (Signed)
Bed: [id number] Expected date:  Expected time:  Means of arrival:  Comments: EMS- 64yo M, weakness x 3 days/headache

## 2015-12-02 NOTE — ED Notes (Addendum)
CBG:90 

## 2015-12-03 ENCOUNTER — Inpatient Hospital Stay (HOSPITAL_COMMUNITY): Payer: Medicare Other

## 2015-12-03 DIAGNOSIS — R7401 Elevation of levels of liver transaminase levels: Secondary | ICD-10-CM

## 2015-12-03 DIAGNOSIS — R740 Nonspecific elevation of levels of transaminase and lactic acid dehydrogenase [LDH]: Secondary | ICD-10-CM

## 2015-12-03 DIAGNOSIS — E860 Dehydration: Secondary | ICD-10-CM | POA: Insufficient documentation

## 2015-12-03 DIAGNOSIS — R1084 Generalized abdominal pain: Secondary | ICD-10-CM

## 2015-12-03 LAB — URINALYSIS, ROUTINE W REFLEX MICROSCOPIC
BILIRUBIN URINE: NEGATIVE
GLUCOSE, UA: NEGATIVE mg/dL
Ketones, ur: 15 mg/dL — AB
Leukocytes, UA: NEGATIVE
Nitrite: NEGATIVE
PROTEIN: 30 mg/dL — AB
Specific Gravity, Urine: 1.019 (ref 1.005–1.030)
pH: 5.5 (ref 5.0–8.0)

## 2015-12-03 LAB — CBC
HEMATOCRIT: 37.2 % — AB (ref 39.0–52.0)
Hemoglobin: 12.4 g/dL — ABNORMAL LOW (ref 13.0–17.0)
MCH: 29.7 pg (ref 26.0–34.0)
MCHC: 33.3 g/dL (ref 30.0–36.0)
MCV: 89 fL (ref 78.0–100.0)
Platelets: 176 10*3/uL (ref 150–400)
RBC: 4.18 MIL/uL — AB (ref 4.22–5.81)
RDW: 14.1 % (ref 11.5–15.5)
WBC: 18.5 10*3/uL — AB (ref 4.0–10.5)

## 2015-12-03 LAB — BASIC METABOLIC PANEL
ANION GAP: 9 (ref 5–15)
BUN: 30 mg/dL — ABNORMAL HIGH (ref 6–20)
CHLORIDE: 106 mmol/L (ref 101–111)
CO2: 17 mmol/L — AB (ref 22–32)
Calcium: 7.4 mg/dL — ABNORMAL LOW (ref 8.9–10.3)
Creatinine, Ser: 0.85 mg/dL (ref 0.61–1.24)
GFR calc Af Amer: >60 mL/min (ref >60)
GFR calc non Af Amer: >60 mL/min (ref >60)
GLUCOSE: 92 mg/dL (ref 65–99)
POTASSIUM: 3.9 mmol/L (ref 3.5–5.1)
Sodium: 132 mmol/L — ABNORMAL LOW (ref 135–145)

## 2015-12-03 LAB — MRSA PCR SCREENING: MRSA BY PCR: NEGATIVE

## 2015-12-03 LAB — URINE MICROSCOPIC-ADD ON

## 2015-12-03 LAB — T4, FREE: Free T4: 0.69 ng/dL (ref 0.61–1.12)

## 2015-12-03 LAB — LACTIC ACID, PLASMA: LACTIC ACID, VENOUS: 1.9 mmol/L (ref 0.5–2.0)

## 2015-12-03 MED ORDER — ALBUMIN HUMAN 5 % IV SOLN
12.5000 g | Freq: Once | INTRAVENOUS | Status: AC
  Administered 2015-12-03: 12.5 g via INTRAVENOUS
  Filled 2015-12-03: qty 250

## 2015-12-03 MED ORDER — IOHEXOL 300 MG/ML  SOLN
100.0000 mL | Freq: Once | INTRAMUSCULAR | Status: AC | PRN
  Administered 2015-12-03: 100 mL via INTRAVENOUS

## 2015-12-03 MED ORDER — IOHEXOL 300 MG/ML  SOLN
50.0000 mL | Freq: Once | INTRAMUSCULAR | Status: AC | PRN
  Administered 2015-12-03: 50 mL via ORAL

## 2015-12-03 MED ORDER — ALBUTEROL SULFATE (2.5 MG/3ML) 0.083% IN NEBU
2.5000 mg | INHALATION_SOLUTION | Freq: Four times a day (QID) | RESPIRATORY_TRACT | Status: DC | PRN
  Administered 2015-12-03 – 2015-12-06 (×2): 2.5 mg via RESPIRATORY_TRACT
  Filled 2015-12-03 (×2): qty 3

## 2015-12-03 MED ORDER — IOHEXOL 300 MG/ML  SOLN: 25.0000 mL | INTRAMUSCULAR | Status: AC

## 2015-12-03 MED ORDER — IPRATROPIUM-ALBUTEROL 0.5-2.5 (3) MG/3ML IN SOLN: 3.0000 mL | Freq: Three times a day (TID) | RESPIRATORY_TRACT | Status: DC

## 2015-12-03 NOTE — Progress Notes (Signed)
Md aware of patients BP 80/49. No new order at this time.

## 2015-12-03 NOTE — Progress Notes (Signed)
PROGRESS NOTE  Anthony [place] [id number] DOB: 01/31/1951 DOA: 12/02/2015 PCP: [name], MD Brief History 65 year old male with a history of COPD, systolic CHF, hyperlipidemia, and continued tobacco use presented with three-day history of increasing generalized weakness to the point where he was unable to get out of bed to eat any food or take any of his medications. He states that he has run out of his medications for the past few weeks. He is a difficult historian and is unable to clarify many of his symptoms. He has been having some abdominal pain, although I cannot ascertain how long. However, he denies any vomiting, diarrhea, chest pain, hemoptysis, headache, fevers. He states that he has not had a bowel movement in over 5 days. Upon presentation, the patient was noted to have WBC 21.5 and elevated serum creatinine 1.00. Lactic acid peaked at 1.9. The patient was made for dehydration and starvation ketosis. Assessment/Plan: Constipation/abdominal pain -Concerned about bowel obstruction -Abdominal x-ray shows stool-filled colon with gaseous distention of the small bowel concerning for possible obstruction -The patient was discharged from the hospital on 10/04/2015 after conservative treatment for SBO -CT abdomen pelvis -No vomiting presently -Nothing by mouth for now Leukocytosis -Concerned about intra-abdominal process -Chest x-ray negative for infiltrates -Urinalysis negative for pyuria -Lactic acid 1.9 -Blood cultures 2 sets Starvation ketosis/malnutrition/Dehydration -Nutritionist consult -Continue IV fluids -pt received 2L in ED COPD -Presently stable on room air -Restart Dulera -Duo nebs for intermittent wheezing Hyperlipidemia -Continue Lipitor Hyperthyroidism -Restart Tapazole -No clinical evidence of thyroid storm -TSH 0.052, free T4 [id number] Chronic systolic CHF -[id number] echo EF 50-55 percent -This has improved from previous EF 20-25% -Clinically  euvolemic -Daily weights Transaminasemia -d/c statin -HIV -UDS -Hepatitis B surface antigen, hepatitis C antibody  Family Communication:   No family at beside Disposition Plan:   Home 2-3 days      Procedures/Studies: Dg Chest 2 View  12/02/2015  CLINICAL DATA:  Shortness of breath. EXAM: CHEST  2 VIEW COMPARISON:  11/20/2015 and 11/08/2015. FINDINGS: The heart size and mediastinal contours are stable. The lungs remain hyperinflated with flattening of the diaphragm. No superimposed airspace disease, pleural effusion or pneumothorax demonstrated. The bones appear unchanged. IMPRESSION: Stable chronic findings of moderate COPD. No acute superimposed process. Electronically Signed   By: Richardean Sale M.D.   On: 12/02/2015 19:01   Dg Chest 2 View  11/20/2015  CLINICAL DATA:  Acute onset of worsening shortness of breath and chest tightness. Initial encounter. EXAM: CHEST  2 VIEW COMPARISON:  Chest radiograph performed 11/08/2015 FINDINGS: The lungs are hyperexpanded, with flattening of the hemidiaphragms, compatible with COPD. Peribronchial thickening is noted. Mildly increased interstitial markings appear chronic in nature. There is no definite evidence of superimposed focal opacification, pleural effusion or pneumothorax. The heart is normal in size; the mediastinal contour is within normal limits. No acute osseous abnormalities are seen. IMPRESSION: Findings of COPD. No definite superimposed airspace consolidation, though minimal right basilar opacity cannot be entirely excluded, depending on the patient's symptoms. Electronically Signed   By: Garald Balding M.D.   On: 11/20/2015 06:51   Dg Chest 2 View  11/08/2015  CLINICAL DATA:  Shortness of breath and cough for a few days. Initial encounter. EXAM: CHEST  2 VIEW COMPARISON:  PA and lateral chest 10/17/2015, 10/11/2015 and 03/25/2015. CT chest 03/25/2015. FINDINGS: The lungs are markedly emphysematous but clear. No pneumothorax or pleural  effusion. Heart size is normal. Remote left  clavicle fracture is noted. IMPRESSION: Emphysema without acute disease. Electronically Signed   By: Inge Rise M.D.   On: 11/08/2015 15:52   Dg Abd 1 View  12/02/2015  CLINICAL DATA:  Fever, weakness, and shortness of breath for several days. Abdominal pain. EXAM: ABDOMEN - 1 VIEW COMPARISON:  10/13/2015 FINDINGS: Gas and stool throughout the colon without significant distention. There is gaseous distention of mid abdominal small bowel. Changes could represent small bowel obstruction or severe constipation. No radiopaque stones. Visualized bones appear intact. IMPRESSION: Diffusely stool-filled colon without distention. Mid abdominal gas-filled small bowel. Changes may represent small bowel obstruction or severe constipation. Electronically Signed   By: Lucienne Capers M.D.   On: 12/02/2015 21:19         Subjective: Patient has intermittent abdominal pain.  Denies any fevers, chills, chest pain, shortness breath, vomiting, diarrhea. Complains constipation. Denies any headache, neck pain, rashes.  Objective: Filed Vitals:   12/02/15 2156 12/02/15 2326 12/03/15 0525 12/03/15 0750  BP: 104/71  86/58   Pulse: 84  93 88  Temp: 98.4 F (36.9 C)  98.1 F (36.7 C)   TempSrc: Oral  Oral   Resp: 18  18 18   Height: 5\' 9"  (1.753 m)     Weight: 46.7 kg (102 lb 15.3 oz)     SpO2: 90% 92% 94% 94%    Intake/Output Summary (Last 24 hours) at 12/03/15 1010 Last data filed at 12/03/15 [id number]  Gross per 24 hour  Intake 2446.25 ml  Output    100 ml  Net 2346.25 ml   Weight change:  Exam:   General:  Pt is alert, follows commands appropriately, not in acute distress  HEENT: No icterus, No thrush, No neck mass, [place]/AT  Cardiovascular: RRR, S1/S2, no rubs, no gallops  Respiratory: Scattered bibasilar wheezing. Scattered rales bibasilar.  Abdomen: Soft/+BS, , non distended, no guarding; epigastric and left upper quadrant tenderness without  rebound; no hepatosplenomegaly  Extremities: No edema, No lymphangitis, No petechiae, No rashes, no synovitis; clubbing without any cyanosis  Data Reviewed: Basic Metabolic Panel:  Recent Labs Lab 12/02/15 1805 12/02/15 2059 12/03/15 0027  NA 134*  --  132*  K 4.5  --  3.9  CL 101  --  106  CO2 17*  --  17*  GLUCOSE 93  --  92  BUN 37*  --  30*  CREATININE 1.00  --  0.85  CALCIUM 8.8*  --  7.4*  MG  --  2.0  --   PHOS  --  4.4  --    Liver Function Tests:  Recent Labs Lab 12/02/15 1805  AST 170*  ALT 30  ALKPHOS 58  BILITOT 0.4  PROT 6.9  ALBUMIN 3.9   No results for input(s): LIPASE, AMYLASE in the last 168 hours. No results for input(s): AMMONIA in the last 168 hours. CBC:  Recent Labs Lab 12/02/15 1805 12/03/15 0027  WBC 21.5* 18.5*  HGB 14.7 12.4*  HCT 44.4 37.2*  MCV 88.8 89.0  PLT 215 176   Cardiac Enzymes: No results for input(s): CKTOTAL, CKMB, CKMBINDEX, TROPONINI in the last 168 hours. BNP: Invalid input(s): POCBNP CBG:  Recent Labs Lab 12/02/15 1809  GLUCAP 90    Recent Results (from the past 240 hour(s))  MRSA PCR Screening     Status: None   Collection Time: 12/03/15  8:26 AM  Result Value Ref Range Status   MRSA by PCR NEGATIVE NEGATIVE Final    Comment:  The GeneXpert MRSA Assay (FDA approved for NASAL specimens only), is one component of a comprehensive MRSA colonization surveillance program. It is not intended to diagnose MRSA infection nor to guide or monitor treatment for MRSA infections.      Scheduled Meds: . aspirin EC  81 mg Oral Daily  . atorvastatin  40 mg Oral QHS  . heparin  5,000 Units Subcutaneous 3 times per day  . methimazole  10 mg Oral BID  . mometasone-formoterol  2 puff Inhalation BID  . multivitamin with minerals  1 tablet Oral Daily  . tiotropium  18 mcg Inhalation Daily   Continuous Infusions: . sodium chloride 125 mL/hr at 12/03/15 0506     Suzy Kugel, DO  [facility] Pager [phone]  If 7PM-7AM, please contact night-coverage [email or web address] Password [email or web address] 12/03/2015, 10:10 AM   LOS: 1 day

## 2015-12-03 NOTE — Progress Notes (Signed)
Initial Nutrition Assessment  DOCUMENTATION CODES:   Severe malnutrition in context of social or environmental circumstances, Underweight  INTERVENTION:   Once diet is advanced, may need to monitor magnesium, potassium, and phosphorus daily for at least 3 days, MD to replete as needed, as pt is at risk for refeeding syndrome given severe malnutrition status.  -Diet advancement per MD -Once diet advanced, provide Ensure Enlive po BID, each supplement provides 350 kcal and 20 grams of protein -Patient would benefit from education/resources for free meals and food pantries in [place] area -RD to continue to monitor  NUTRITION DIAGNOSIS:   Malnutrition related to social / environmental circumstances as evidenced by percent weight loss, severe depletion of body fat, severe depletion of muscle mass.  GOAL:   Patient will meet greater than or equal to 90% of their needs  MONITOR:   Diet advancement, Labs, Weight trends, I & O's  REASON FOR ASSESSMENT:   Malnutrition Screening Tool    ASSESSMENT:   65 year old male with a history of COPD, systolic CHF, hyperlipidemia, and continued tobacco use presented with three-day history of increasing generalized weakness to the point where he was unable to get out of bed to eat any food or take any of his medications. He states that he has run out of his medications for the past few weeks. He is a difficult historian and is unable to clarify many of his symptoms. He has been having some abdominal pain, although I cannot ascertain how long. However, he denies any vomiting, diarrhea, chest pain, hemoptysis, headache, fevers. He states that he has not had a bowel movement in over 5 days.  Patient attempted to provide history to RD, however pt would whisper and he was unable to form complete thoughts. Pt asked repeatedly about his inhaler. Could not provide history on weight status and when he last ate. Pt became agitated and states he "wants to get  [place] here". Pt was attempting to drink contrast for CT but did not seem interested when prompted to drink it.  States he is hungry. Per chart review, pt has been unable to afford food and is unable to drive. He would benefit from information regarding free meals and food pantries in [place]. RD attempted to ask pt about these resources but he kept speaking off topic. Pt seemed to be missing some teeth, question chewing ability.  Pt would benefit from nutritional supplementation once diet is advanced. Will attempt but unsure if pt will accept supplements.   Per weight history, pt has lost 9 lb since 2/22 (8% wt loss x 2 weeks, significant for time frame). Pt is underweight.  Nutrition-Focused physical exam completed. Findings are severe fat depletion, severe muscle depletion, and no edema. Recommend monitor for refeeding risk.  Labs reviewed: Low Na Mg/Phos WNL  Diet Order:  Diet NPO time specified Except for: Sips with Meds  Skin:  Reviewed, no issues  Last BM:     Height:   Ht Readings from Last 1 Encounters:  12/02/15 5\' 9"  (1.753 m)    Weight:   Wt Readings from Last 1 Encounters:  12/02/15 102 lb 15.3 oz (46.7 kg)    Ideal Body Weight:  72.7 kg  BMI:  Body mass index is 15.2 kg/(m^2).  Estimated Nutritional Needs:   Kcal:  1600-1800  Protein:  70-80g  Fluid:  1.8-2L/day  EDUCATION NEEDS:   No education needs identified at this time  Clayton Bibles, MS, RD, LDN Pager: [phone] After Hours Pager: [phone]

## 2015-12-03 NOTE — Progress Notes (Signed)
Patient's Albumin completed at 1605.  Patient experienced no adverse reactions. VSS 30 minutes after completion of infusion.

## 2015-12-03 NOTE — Progress Notes (Signed)
Albumin human 5% started per order. Rate initiated at 60 . Vital signs checked before administration and before increasing rate at 120 cc /hr,site - LFA unremarkable. No adverse reactions noted at this time. Will continue to monitor.

## 2015-12-04 DIAGNOSIS — E876 Hypokalemia: Secondary | ICD-10-CM

## 2015-12-04 DIAGNOSIS — K5900 Constipation, unspecified: Secondary | ICD-10-CM

## 2015-12-04 LAB — CBC
HEMATOCRIT: 30.6 % — AB (ref 39.0–52.0)
Hemoglobin: 10.3 g/dL — ABNORMAL LOW (ref 13.0–17.0)
MCH: 29.7 pg (ref 26.0–34.0)
MCHC: 33.7 g/dL (ref 30.0–36.0)
MCV: 88.2 fL (ref 78.0–100.0)
PLATELETS: 150 10*3/uL (ref 150–400)
RBC: 3.47 MIL/uL — ABNORMAL LOW (ref 4.22–5.81)
RDW: 14.3 % (ref 11.5–15.5)
WBC: 12 10*3/uL — AB (ref 4.0–10.5)

## 2015-12-04 LAB — COMPREHENSIVE METABOLIC PANEL
ALK PHOS: 38 U/L (ref 38–126)
ALT: 90 U/L — ABNORMAL HIGH (ref 17–63)
AST: 497 U/L — AB (ref 15–41)
Albumin: 3.1 g/dL — ABNORMAL LOW (ref 3.5–5.0)
Anion gap: 5 (ref 5–15)
BUN: 13 mg/dL (ref 6–20)
CALCIUM: 8 mg/dL — AB (ref 8.9–10.3)
CO2: 22 mmol/L (ref 22–32)
Chloride: 110 mmol/L (ref 101–111)
Creatinine, Ser: 0.47 mg/dL — ABNORMAL LOW (ref 0.61–1.24)
GFR calc Af Amer: >60 mL/min (ref >60)
GLUCOSE: 106 mg/dL — AB (ref 65–99)
POTASSIUM: 3.3 mmol/L — AB (ref 3.5–5.1)
Sodium: 137 mmol/L (ref 135–145)
TOTAL PROTEIN: 4.9 g/dL — AB (ref 6.5–8.1)
Total Bilirubin: 0.4 mg/dL (ref 0.3–1.2)

## 2015-12-04 LAB — HEPATITIS B SURFACE ANTIGEN: Hepatitis B Surface Ag: NEGATIVE

## 2015-12-04 LAB — HIV ANTIBODY (ROUTINE TESTING W REFLEX): HIV Screen 4th Generation wRfx: NONREACTIVE

## 2015-12-04 LAB — HEPATITIS C ANTIBODY: HCV Ab: <0.1 s/co ratio (ref 0.0–0.9)

## 2015-12-04 MED ORDER — MILK AND MOLASSES ENEMA
1.0000 | Freq: Once | RECTAL | Status: DC
  Filled 2015-12-04 (×2): qty 250

## 2015-12-04 MED ORDER — SENNA 8.6 MG PO TABS
2.0000 | ORAL_TABLET | Freq: Every day | ORAL | Status: DC
  Administered 2015-12-04 – 2015-12-05 (×2): 17.2 mg via ORAL
  Filled 2015-12-04 (×2): qty 2

## 2015-12-04 MED ORDER — SODIUM CHLORIDE 0.9 % IV SOLN: INTRAVENOUS | Status: DC

## 2015-12-04 MED ORDER — POTASSIUM CHLORIDE CRYS ER 20 MEQ PO TBCR
40.0000 meq | EXTENDED_RELEASE_TABLET | Freq: Once | ORAL | Status: AC
  Administered 2015-12-04: 40 meq via ORAL
  Filled 2015-12-04: qty 2

## 2015-12-04 MED ORDER — POLYETHYLENE GLYCOL 3350 17 G PO PACK
17.0000 g | PACK | Freq: Two times a day (BID) | ORAL | Status: DC
  Administered 2015-12-04 – 2015-12-05 (×3): 17 g via ORAL
  Filled 2015-12-04: qty 1
  Filled 2015-12-04: qty 2
  Filled 2015-12-04 (×3): qty 1

## 2015-12-04 NOTE — Progress Notes (Signed)
CSW still awaiting PT eval.   CSW will continue to follow for any needs that arise.   Pete Pelt MSW, [facility]  [phone]

## 2015-12-04 NOTE — Progress Notes (Signed)
This CM was contacted by CM from [facility] for [facility] [phone]). They follow pt at home.  Pt lives at a hotel and has been having trouble getting his medications and with transportation. Per Joelene Millin, pt has recently been referred for [facility] transportation and has been connected with a pharmacy that delivers ([place]). CM has also been calling ALFs for Medicaid beds on pt's behave.  This CM contacted unit CSW and will await PT evaluation for additional DC planning assistance. Marney Doctor RN,BSN,NCM [phone]

## 2015-12-04 NOTE — Progress Notes (Signed)
Pt refused molasses/milk enema.

## 2015-12-04 NOTE — Evaluation (Signed)
Physical Therapy Evaluation Patient Details Name: Anthony Bolton MRN: [id number] DOB: [birth year] Today's Date: 12/04/2015   History of Present Illness  65 year old male with history of COPD, hard of hearing, hyperlipidemia, tobacco use, hypothyroidism and coronary artery disease presented to the ED 12/02/15 Anthony Bolton is a 65 y.o. male who presents to the ED 12/02/15  with inability to ambulate . He is unable to stand up and get food. Also has SOB due to no access to home nebulizer.  Clinical Impression  The patient is extremely HOH, reports that he lives i motel. Patient appears to ambulate  Well in room. Did not take in hallway. Used inhaler. Patient will benefit from PT to address problems listed in the note below.   Follow Up Recommendations No PT follow up (will need to check out in [place].)    Equipment Recommendations   (tbd)    Recommendations for Other Services       Precautions / Restrictions Precautions Precautions: Fall Precaution Comments: contact, hair net for bed bugs noted at his hiome      Mobility  Bed Mobility Overal bed mobility: Independent                Transfers Overall transfer level: Needs assistance Equipment used: None Transfers: Sit to/from Stand Sit to Stand: Supervision            Ambulation/Gait Ambulation/Gait assistance: Supervision Ambulation Distance (Feet): 45 Feet Assistive device: None Gait Pattern/deviations: Step-through pattern     General Gait Details: pushed IV ole at times. gait appears teady, unable to get into [place].  Stairs            Wheelchair Mobility    Modified Rankin (Stroke Patients Only)       Balance Overall balance assessment: Needs assistance         Standing balance support: During functional activity;No upper extremity supported Standing balance-Leahy Scale: Fair                               Pertinent Vitals/Pain Pain Assessment: No/denies pain    Home Living  Family/patient expects to be discharged to:: Other (Comment) (motel) Living Arrangements: Alone           Home Layout: One level Home Equipment: None      Prior Function           Comments: pt guarded when asked about home situation and PLOF; difficult to get info d/t pt being very HOH as well     Hand Dominance        Extremity/Trunk Assessment   Upper Extremity Assessment: Generalized weakness           Lower Extremity Assessment: Generalized weakness      Cervical / Trunk Assessment: Kyphotic  Communication   Communication: HOH  Cognition Arousal/Alertness: Awake/alert Behavior During Therapy: WFL for tasks assessed/performed Overall Cognitive Status: Within Functional Limits for tasks assessed                      General Comments      Exercises        Assessment/Plan    PT Assessment Patient needs continued PT services  PT Diagnosis Difficulty walking;Generalized weakness   PT Problem List Decreased strength;Decreased activity tolerance;Decreased balance;Decreased mobility  PT Treatment Interventions DME instruction;Gait training;Functional mobility training;Therapeutic activities   PT Goals (Current goals can be found in the Care Plan section)  Acute Rehab PT Goals Patient Stated Goal: none stated PT Goal Formulation: With patient Time For Goal Achievement: 12/18/15 Potential to Achieve Goals: Good    Frequency Min 3X/week   Barriers to discharge        Co-evaluation               End of Session   Activity Tolerance: Patient tolerated treatment well Patient left: in bed;with call bell/phone within reach;with bed alarm set Nurse Communication: Mobility status         Time: [id number] PT Time Calculation (min) (ACUTE ONLY): 28 min   Charges:   PT Evaluation $PT Eval Low Complexity: 1 Procedure PT Treatments $Gait Training: 8-22 mins   PT G Codes:        Anthony [place]  PT [id number]  12/04/2015, 3:58 PM

## 2015-12-04 NOTE — Progress Notes (Signed)
PROGRESS NOTE    Anthony Bolton  [id number]  DOB: June 04, 1951  DOA: 12/02/2015 PCP: [name], MD Outpatient Specialists:   Hospital course: 65 year old male with a history of COPD, systolic CHF, hyperlipidemia, and continued tobacco use presented with three-day history of increasing generalized weakness to the point where he was unable to get out of bed to eat any food or take any of his medications. He states that he has run out of his medications for the past few weeks. He has been having some abdominal pain. He stated that he had not had a bowel movement in over 5 days. Upon presentation, the patient was noted to have WBC 21.5 and elevated serum creatinine 1.00. Lactic acid peaked at 1.9. The patient was made for dehydration and starvation ketosis.   Assessment & Plan:   Constipation/abdominal pain -Concerned about bowel obstruction -Abdominal x-ray shows stool-filled colon with gaseous distention of the small bowel concerning for possible obstruction -The patient was discharged from the hospital on 10/04/2015 after conservative treatment for SBO -CT abdomen pelvis: No acute findings and no bowel obstruction. Moderate stool burden. - Treat with aggressive bowel regimen for constipation.  Leukocytosis -Concerned about intra-abdominal process -Chest x-ray negative for infiltrates -Urinalysis negative for pyuria -Lactic acid 1.9 -Blood cultures 2 sets: Negative to date. - Improved.  Starvation ketosis/malnutrition/Dehydration -Nutritionist consult -Continue IV fluids for additional 24 hours.  COPD -Presently stable on room air -Restarted Dulera -Duo nebs for intermittent wheezing  Hyperlipidemia -Continue Lipitor  Hyperthyroidism -Restarted Tapazole -No clinical evidence of thyroid storm -TSH 0.052, free T4 [id number]  Chronic systolic CHF -[id number] echo EF 50-55 percent -This has improved from previous EF 20-25% -Clinically euvolemic -Daily  weights  Transaminasemia -d/c statin. Unclear etiology. CT abdomen without acute findings. Follow LFTs in a.m. -HIV: Nonreactive -UDS -Hepatitis B surface antigen, hepatitis C antibody <0.1  Soft blood pressures - Asymptomatic. Not sure if he chronically runs low blood pressures.  Hypokalemia - Replace and follow.  Anemia - Follow CBCs.  DVT prophylaxis: Subcutaneous heparin Code Status: Full Family Communication: None at bedside Disposition Plan: DC home when medically stable, possibly in the next 48 hours.   Consultants:  None  Procedures:  None  Antimicrobials:  None   Subjective: Extremely hard of hearing. Denies complaints. Denies abdominal pain. As per RN, no acute issues.  Objective: Filed Vitals:   12/03/15 2013 12/03/15 2114 12/04/15 0938 12/04/15 1015  BP: 81/58   94/64  Pulse: 80   78  Temp: 97.9 F (36.6 C)     TempSrc: Oral     Resp: 16     Height:      Weight:      SpO2: 95% 94% 95%     Intake/Output Summary (Last 24 hours) at 12/04/15 1832 Last data filed at 12/04/15 1829  Gross per 24 hour  Intake   1195 ml  Output      0 ml  Net   1195 ml   Filed Weights   12/02/15 1729 12/02/15 2156  Weight: 48.535 kg (107 lb) 46.7 kg (102 lb 15.3 oz)    Exam:  General exam: Moderately built and thinly nourished frail middle-aged male, looks older than stated age, lying comfortably supine in bed. Extremely hard of hearing. Respiratory system: Clear. No increased work of breathing. Cardiovascular system: S1 & S2 heard, RRR. No JVD, murmurs, gallops, clicks or pedal edema. Gastrointestinal system: Abdomen is nondistended, soft and nontender. Normal bowel sounds heard. Central nervous system: Alert  and oriented. No focal neurological deficits. Extremities: Symmetric 5 x 5 power.   Data Reviewed: Basic Metabolic Panel:  Recent Labs Lab 12/02/15 1805 12/02/15 2059 12/03/15 0027 12/04/15 0344  NA 134*  --  132* 137  K 4.5  --  3.9 3.3*   CL 101  --  106 110  CO2 17*  --  17* 22  GLUCOSE 93  --  92 106*  BUN 37*  --  30* 13  CREATININE 1.00  --  0.85 0.47*  CALCIUM 8.8*  --  7.4* 8.0*  MG  --  2.0  --   --   PHOS  --  4.4  --   --    Liver Function Tests:  Recent Labs Lab 12/02/15 1805 12/04/15 0344  AST 170* 497*  ALT 30 90*  ALKPHOS 58 38  BILITOT 0.4 0.4  PROT 6.9 4.9*  ALBUMIN 3.9 3.1*   No results for input(s): LIPASE, AMYLASE in the last 168 hours. No results for input(s): AMMONIA in the last 168 hours. CBC:  Recent Labs Lab 12/02/15 1805 12/03/15 0027 12/04/15 0344  WBC 21.5* 18.5* 12.0*  HGB 14.7 12.4* 10.3*  HCT 44.4 37.2* 30.6*  MCV 88.8 89.0 88.2  PLT 215 176 150   Cardiac Enzymes: No results for input(s): CKTOTAL, CKMB, CKMBINDEX, TROPONINI in the last 168 hours. BNP (last 3 results) No results for input(s): PROBNP in the last 8760 hours. CBG:  Recent Labs Lab 12/02/15 1809  GLUCAP 90    Recent Results (from the past 240 hour(s))  MRSA PCR Screening     Status: None   Collection Time: 12/03/15  8:26 AM  Result Value Ref Range Status   MRSA by PCR NEGATIVE NEGATIVE Final    Comment:        The GeneXpert MRSA Assay (FDA approved for NASAL specimens only), is one component of a comprehensive MRSA colonization surveillance program. It is not intended to diagnose MRSA infection nor to guide or monitor treatment for MRSA infections.   Culture, blood (Routine X 2) w Reflex to ID Panel     Status: None (Preliminary result)   Collection Time: 12/03/15 11:07 AM  Result Value Ref Range Status   Specimen Description BLOOD RIGHT ARM  Final   Special Requests BOTTLES DRAWN AEROBIC ONLY 5CC  Final   Culture   Final    NO GROWTH 1 DAY Performed at [facility]    Report Status PENDING  Incomplete  Culture, blood (Routine X 2) w Reflex to ID Panel     Status: None (Preliminary result)   Collection Time: 12/03/15 11:10 AM  Result Value Ref Range Status   Specimen  Description BLOOD RIGHT HAND  Final   Special Requests BOTTLES DRAWN AEROBIC AND ANAEROBIC 5CC  Final   Culture   Final    NO GROWTH 1 DAY Performed at [facility]    Report Status PENDING  Incomplete         Studies: Dg Chest 2 View  12/02/2015  CLINICAL DATA:  Shortness of breath. EXAM: CHEST  2 VIEW COMPARISON:  11/20/2015 and 11/08/2015. FINDINGS: The heart size and mediastinal contours are stable. The lungs remain hyperinflated with flattening of the diaphragm. No superimposed airspace disease, pleural effusion or pneumothorax demonstrated. The bones appear unchanged. IMPRESSION: Stable chronic findings of moderate COPD. No acute superimposed process. Electronically Signed   By: Richardean Sale M.D.   On: 12/02/2015 19:01   Dg Abd 1 View  12/02/2015  CLINICAL DATA:  Fever, weakness, and shortness of breath for several days. Abdominal pain. EXAM: ABDOMEN - 1 VIEW COMPARISON:  10/13/2015 FINDINGS: Gas and stool throughout the colon without significant distention. There is gaseous distention of mid abdominal small bowel. Changes could represent small bowel obstruction or severe constipation. No radiopaque stones. Visualized bones appear intact. IMPRESSION: Diffusely stool-filled colon without distention. Mid abdominal gas-filled small bowel. Changes may represent small bowel obstruction or severe constipation. Electronically Signed   By: Lucienne Capers M.D.   On: 12/02/2015 21:19   Ct Abdomen Pelvis W Contrast  12/03/2015  CLINICAL DATA:  65 year old with a history of COPD and systolic CHF. Weakness. Abdominal pain. EXAM: CT ABDOMEN AND PELVIS WITH CONTRAST TECHNIQUE: Multidetector CT imaging of the abdomen and pelvis was performed using the standard protocol following bolus administration of intravenous contrast. CONTRAST:  65mL OMNIPAQUE IOHEXOL 300 MG/ML SOLN, 162mL OMNIPAQUE IOHEXOL 300 MG/ML SOLN COMPARISON:  10/12/2015 FINDINGS: Lower chest: There is no pleural effusion  identified. The lung bases are clear. Hepatobiliary: No masses or other significant abnormality. Gallbladder is not visualized and may be collapsed. No biliary dilatation noted. Pancreas: No mass, inflammatory changes, or other significant abnormality. Spleen: Within normal limits in size and appearance. Adrenals/Urinary Tract: Left adrenal mass is again identified. This measures 3.7 x 3.8 cm and is unchanged from previous exam compatible with benign adrenal adenoma. No kidney stones or obstructive uropathy identified. Left renal cyst is noted. The urinary bladder is within normal limits. Stomach/Bowel: No pathologic dilatation of the large or small bowel loops. There is moderate stool identified throughout the colon and rectum. The previously noted small bowel obstruction pattern has resolved in the interval. Vascular/lymph nodes: Calcified atherosclerotic disease involves the abdominal aorta. No aneurysm. No enlarged retroperitoneal or mesenteric adenopathy. No enlarged pelvic or inguinal lymph nodes. Reproductive: Prostate gland is enlarged. Other: No free fluid or fluid collections identified. Musculoskeletal: No aggressive lytic or sclerotic bone lesions identified. IMPRESSION: 1. No acute findings no evidence for bowel obstruction. 2. Moderate stool burden identified within the colon. 3. Aortic atherosclerosis. Electronically Signed   By: Kerby Moors M.D.   On: 12/03/2015 17:05        Scheduled Meds: . aspirin EC  81 mg Oral Daily  . heparin  5,000 Units Subcutaneous 3 times per day  . methimazole  10 mg Oral BID  . mometasone-formoterol  2 puff Inhalation BID  . multivitamin with minerals  1 tablet Oral Daily   Continuous Infusions: . sodium chloride 1,000 mL (12/03/15 1316)    Principal Problem:   Malnutrition due to starvation [facility]) Active Problems:   COPD (chronic obstructive pulmonary disease) (HCC)   Chronic systolic congestive heart failure, NYHA class 3 (HCC)   Malnutrition of  moderate degree (HCC)   Hard of hearing   High anion gap metabolic acidosis   Leukocytosis   Transaminasemia   Dehydration    Time spent: 25 minutes.    Vernell Leep, MD, FACP, FHM. [facility] Pager [phone]  If 7PM-7AM, please contact night-coverage [email or web address] Password [email or web address] 12/04/2015, 6:32 PM    LOS: 2 days

## 2015-12-05 ENCOUNTER — Inpatient Hospital Stay (HOSPITAL_COMMUNITY): Payer: Medicare Other

## 2015-12-05 DIAGNOSIS — K5641 Fecal impaction: Secondary | ICD-10-CM

## 2015-12-05 DIAGNOSIS — R7989 Other specified abnormal findings of blood chemistry: Secondary | ICD-10-CM

## 2015-12-05 LAB — COMPREHENSIVE METABOLIC PANEL
ALBUMIN: 2.7 g/dL — AB (ref 3.5–5.0)
ALT: 94 U/L — ABNORMAL HIGH (ref 17–63)
AST: 334 U/L — AB (ref 15–41)
Alkaline Phosphatase: 34 U/L — ABNORMAL LOW (ref 38–126)
Anion gap: 6 (ref 5–15)
BUN: 10 mg/dL (ref 6–20)
CHLORIDE: 110 mmol/L (ref 101–111)
CO2: 25 mmol/L (ref 22–32)
Calcium: 7.9 mg/dL — ABNORMAL LOW (ref 8.9–10.3)
Creatinine, Ser: 0.59 mg/dL — ABNORMAL LOW (ref 0.61–1.24)
GFR calc Af Amer: >60 mL/min (ref >60)
GLUCOSE: 108 mg/dL — AB (ref 65–99)
POTASSIUM: 3.6 mmol/L (ref 3.5–5.1)
SODIUM: 141 mmol/L (ref 135–145)
Total Bilirubin: 0.2 mg/dL — ABNORMAL LOW (ref 0.3–1.2)
Total Protein: 4.7 g/dL — ABNORMAL LOW (ref 6.5–8.1)

## 2015-12-05 LAB — CBC
HCT: 31.4 % — ABNORMAL LOW (ref 39.0–52.0)
Hemoglobin: 10.4 g/dL — ABNORMAL LOW (ref 13.0–17.0)
MCH: 30 pg (ref 26.0–34.0)
MCHC: 33.1 g/dL (ref 30.0–36.0)
MCV: 90.5 fL (ref 78.0–100.0)
PLATELETS: 158 10*3/uL (ref 150–400)
RBC: 3.47 MIL/uL — ABNORMAL LOW (ref 4.22–5.81)
RDW: 14.7 % (ref 11.5–15.5)
WBC: 9.5 10*3/uL (ref 4.0–10.5)

## 2015-12-05 MED ORDER — SENNA 8.6 MG PO TABS
2.0000 | ORAL_TABLET | Freq: Two times a day (BID) | ORAL | Status: DC
Start: 2015-12-05 — End: 2015-12-07
  Administered 2015-12-05 – 2015-12-06 (×2): 17.2 mg via ORAL
  Filled 2015-12-05 (×3): qty 2
  Filled 2015-12-05: qty 4

## 2015-12-05 MED ORDER — TUBERCULIN PPD 5 UNIT/0.1ML ID SOLN
5.0000 [IU] | Freq: Once | INTRADERMAL | Status: AC
  Administered 2015-12-05: 5 [IU] via INTRADERMAL
  Filled 2015-12-05: qty 0.1

## 2015-12-05 MED ORDER — ACETAMINOPHEN 325 MG PO TABS
650.0000 mg | ORAL_TABLET | Freq: Four times a day (QID) | ORAL | Status: DC | PRN
  Administered 2015-12-05: 650 mg via ORAL
  Filled 2015-12-05: qty 2

## 2015-12-05 NOTE — Progress Notes (Signed)
PROGRESS NOTE    Anthony Bolton  [id number]  DOB: 11/18/1950  DOA: 12/02/2015 PCP: [name], MD Outpatient Specialists:   Hospital course: 65 year old male with a history of COPD, systolic CHF, hyperlipidemia, and continued tobacco use presented with three-day history of increasing generalized weakness to the point where he was unable to get out of bed to eat any food or take any of his medications. He states that he has run out of his medications for the past few weeks. He has been having some abdominal pain. He stated that he had not had a bowel movement in over 5 days. Upon presentation, the patient was noted to have WBC 21.5 and elevated serum creatinine 1.00. Lactic acid peaked at 1.9. The patient was admitted for dehydration and starvation ketosis. Treating for constipation/fecal impaction. Evaluating for abnormal LFTs.   Assessment & Plan:   Fecal impaction/abdominal pain -There was initial concern for bowel obstruction. -Abdominal x-ray shows stool-filled colon with gaseous distention of the small bowel concerning for possible obstruction -The patient was discharged from the hospital on 10/04/2015 after conservative treatment for SBO -CT abdomen pelvis: No acute findings and no bowel obstruction. Moderate stool burden. - Treat with aggressive bowel regimen for constipation. Patient declined enema last night but willing to accept it today.  Leukocytosis -Initial concern for intra-abdominal process -Chest x-ray negative for infiltrates -Urinalysis negative for pyuria -Lactic acid 1.9 -Blood cultures 2 sets: Negative to date. - Resolved.? Reactive.  Starvation ketosis/malnutrition/Dehydration -Nutritionist consult-please see below. -Dehydration resolved. Discontinue IV fluids.  COPD -Presently stable on room air -Restarted Dulera -Duo nebs for intermittent wheezing  Hyperlipidemia -Statins held secondary to abnormal LFTs.  Hyperthyroidism -Restarted  Tapazole -No clinical evidence of thyroid storm -TSH 0.052, free T4 Q000111Q  Chronic systolic CHF -[id number] echo EF 50-55 percent -This has improved from previous EF 20-25% -Clinically euvolemic  Transaminasemia -d/c statin. Unclear etiology. CT abdomen without acute findings. Follow LFTs in a.m. -HIV: Nonreactive -UDS -Hepatitis B surface antigen: Negative, hepatitis C antibody <0.1.  - Denies alcohol use. No GI symptoms reported at this time except constipation - Check RUQ ultrasound, hepatitis A workup, serum ferritin and total CK.   Soft blood pressures - Asymptomatic. Appears chronic and intermittent. As per patient, usually has low blood pressures.  Hypokalemia - Replaced.  Anemia - Stable.  DVT prophylaxis: Subcutaneous heparin Code Status: Full Family Communication: None at bedside Disposition Plan: DC home when medically stable, possibly 3/10.   Consultants:  None  Procedures:  None  Antimicrobials:  None   Subjective: Extremely hard of hearing. Constipation and states that he has not had a BM in approximately 2 weeks. Denies abdominal pain, nausea or vomiting. Refused enema last night but willing to take it today. Denies alcohol use.   Objective: Filed Vitals:   12/04/15 0938 12/04/15 1015 12/04/15 2146 12/05/15 0615  BP:  94/64 94/58 123/75  Pulse:  78 71 86  Temp:   98.6 F (37 C) 98.4 F (36.9 C)  TempSrc:   Oral Oral  Resp:   18 18  Height:      Weight:      SpO2: 95%  92% 97%    Intake/Output Summary (Last 24 hours) at 12/05/15 1304 Last data filed at 12/04/15 2300  Gross per 24 hour  Intake    840 ml  Output    260 ml  Net    580 ml   Filed Weights   12/02/15 1729 12/02/15 2156  Weight: 48.535  kg (107 lb) 46.7 kg (102 lb 15.3 oz)    Exam:  General exam: Moderately built and thinly nourished frail middle-aged male, looks older than stated age, lying comfortably supine in bed. Extremely hard of hearing. Respiratory system:  Clear. No increased work of breathing. Cardiovascular system: S1 & S2 heard, RRR. No JVD, murmurs, gallops, clicks or pedal edema. Gastrointestinal system: Abdomen is nondistended, soft and nontender. Normal bowel sounds heard.Lower abdominal laparotomy scar.  Central nervous system: Alert and oriented. No focal neurological deficits. Extremities: Symmetric 5 x 5 power.   Data Reviewed: Basic Metabolic Panel:  Recent Labs Lab 12/02/15 1805 12/02/15 2059 12/03/15 0027 12/04/15 0344 12/05/15 0332  NA 134*  --  132* 137 141  K 4.5  --  3.9 3.3* 3.6  CL 101  --  106 110 110  CO2 17*  --  17* 22 25  GLUCOSE 93  --  92 106* 108*  BUN 37*  --  30* 13 10  CREATININE 1.00  --  0.85 0.47* 0.59*  CALCIUM 8.8*  --  7.4* 8.0* 7.9*  MG  --  2.0  --   --   --   PHOS  --  4.4  --   --   --    Liver Function Tests:  Recent Labs Lab 12/02/15 1805 12/04/15 0344 12/05/15 0332  AST 170* 497* 334*  ALT 30 90* 94*  ALKPHOS 58 38 34*  BILITOT 0.4 0.4 0.2*  PROT 6.9 4.9* 4.7*  ALBUMIN 3.9 3.1* 2.7*   No results for input(s): LIPASE, AMYLASE in the last 168 hours. No results for input(s): AMMONIA in the last 168 hours. CBC:  Recent Labs Lab 12/02/15 1805 12/03/15 0027 12/04/15 0344 12/05/15 0332  WBC 21.5* 18.5* 12.0* 9.5  HGB 14.7 12.4* 10.3* 10.4*  HCT 44.4 37.2* 30.6* 31.4*  MCV 88.8 89.0 88.2 90.5  PLT 215 176 150 158   Cardiac Enzymes: No results for input(s): CKTOTAL, CKMB, CKMBINDEX, TROPONINI in the last 168 hours. BNP (last 3 results) No results for input(s): PROBNP in the last 8760 hours. CBG:  Recent Labs Lab 12/02/15 1809  GLUCAP 90    Recent Results (from the past 240 hour(s))  MRSA PCR Screening     Status: None   Collection Time: 12/03/15  8:26 AM  Result Value Ref Range Status   MRSA by PCR NEGATIVE NEGATIVE Final    Comment:        The GeneXpert MRSA Assay (FDA approved for NASAL specimens only), is one component of a comprehensive MRSA  colonization surveillance program. It is not intended to diagnose MRSA infection nor to guide or monitor treatment for MRSA infections.   Culture, blood (Routine X 2) w Reflex to ID Panel     Status: None (Preliminary result)   Collection Time: 12/03/15 11:07 AM  Result Value Ref Range Status   Specimen Description BLOOD RIGHT ARM  Final   Special Requests BOTTLES DRAWN AEROBIC ONLY 5CC  Final   Culture   Final    NO GROWTH 1 DAY Performed at [facility]    Report Status PENDING  Incomplete  Culture, blood (Routine X 2) w Reflex to ID Panel     Status: None (Preliminary result)   Collection Time: 12/03/15 11:10 AM  Result Value Ref Range Status   Specimen Description BLOOD RIGHT HAND  Final   Special Requests BOTTLES DRAWN AEROBIC AND ANAEROBIC 5CC  Final   Culture   Final  NO GROWTH 1 DAY Performed at [facility]    Report Status PENDING  Incomplete         Studies: Ct Abdomen Pelvis W Contrast  12/03/2015  CLINICAL DATA:  65 year old with a history of COPD and systolic CHF. Weakness. Abdominal pain. EXAM: CT ABDOMEN AND PELVIS WITH CONTRAST TECHNIQUE: Multidetector CT imaging of the abdomen and pelvis was performed using the standard protocol following bolus administration of intravenous contrast. CONTRAST:  17mL OMNIPAQUE IOHEXOL 300 MG/ML SOLN, 123mL OMNIPAQUE IOHEXOL 300 MG/ML SOLN COMPARISON:  10/12/2015 FINDINGS: Lower chest: There is no pleural effusion identified. The lung bases are clear. Hepatobiliary: No masses or other significant abnormality. Gallbladder is not visualized and may be collapsed. No biliary dilatation noted. Pancreas: No mass, inflammatory changes, or other significant abnormality. Spleen: Within normal limits in size and appearance. Adrenals/Urinary Tract: Left adrenal mass is again identified. This measures 3.7 x 3.8 cm and is unchanged from previous exam compatible with benign adrenal adenoma. No kidney stones or obstructive uropathy  identified. Left renal cyst is noted. The urinary bladder is within normal limits. Stomach/Bowel: No pathologic dilatation of the large or small bowel loops. There is moderate stool identified throughout the colon and rectum. The previously noted small bowel obstruction pattern has resolved in the interval. Vascular/lymph nodes: Calcified atherosclerotic disease involves the abdominal aorta. No aneurysm. No enlarged retroperitoneal or mesenteric adenopathy. No enlarged pelvic or inguinal lymph nodes. Reproductive: Prostate gland is enlarged. Other: No free fluid or fluid collections identified. Musculoskeletal: No aggressive lytic or sclerotic bone lesions identified. IMPRESSION: 1. No acute findings no evidence for bowel obstruction. 2. Moderate stool burden identified within the colon. 3. Aortic atherosclerosis. Electronically Signed   By: Kerby Moors M.D.   On: 12/03/2015 17:05        Scheduled Meds: . aspirin EC  81 mg Oral Daily  . heparin  5,000 Units Subcutaneous 3 times per day  . methimazole  10 mg Oral BID  . milk and molasses  1 enema Rectal Once  . mometasone-formoterol  2 puff Inhalation BID  . multivitamin with minerals  1 tablet Oral Daily  . polyethylene glycol  17 g Oral BID  . senna  2 tablet Oral Daily   Continuous Infusions:    Principal Problem:   Malnutrition due to starvation [facility]) Active Problems:   COPD (chronic obstructive pulmonary disease) (HCC)   Chronic systolic congestive heart failure, NYHA class 3 (HCC)   Malnutrition of moderate degree (HCC)   Hard of hearing   High anion gap metabolic acidosis   Leukocytosis   Transaminasemia   Dehydration    Time spent: 25 minutes.    Vernell Leep, MD, FACP, FHM. [facility] Pager [phone]  If 7PM-7AM, please contact night-coverage [email or web address] Password [email or web address] 12/05/2015, 1:04 PM    LOS: 3 days

## 2015-12-05 NOTE — NC FL2 (Signed)
[place] MEDICAID FL2 LEVEL OF CARE SCREENING TOOL     IDENTIFICATION  Patient Name: Anthony Bolton Birthdate: 1951/08/06 Sex: male Admission Date (Current Location): 12/02/2015  [facility] and [place] Number:  Herbalist and Address:  [facility],  [address], [place]      Provider Number: [id number]  Attending Physician Name and Address:  Modena Jansky, MD  Relative Name and Phone Number:       Current Level of Care: Hospital Recommended Level of Care: Long Prior Approval Number:    Date Approved/Denied: 04/29/15 PASRR Number: [id number]  Discharge Plan: SNF    Current Diagnoses: Patient Active Problem List   Diagnosis Date Noted  . Transaminasemia 12/03/2015  . Dehydration   . High anion gap metabolic acidosis [id number]  . Malnutrition due to starvation ([place]) 12/02/2015  . Leukocytosis 12/02/2015  . Abdominal pain 10/12/2015  . Nausea & vomiting 10/12/2015  . Chest wall pain 10/12/2015  . AKI (acute kidney injury) ([place]) 10/12/2015  . SBO (small bowel obstruction) ([place]) 10/12/2015  . Acute respiratory failure ([place]) 08/11/2015  . Hard of hearing 08/11/2015  . CAD (nonobstructive per cath 2015) 08/11/2015  . HTN (hypertension) 08/11/2015  . HLD (hyperlipidemia)   . Malnutrition of moderate degree ([place]) 03/02/2015  . COPD exacerbation ([place]) 03/01/2015  . Chronic systolic congestive heart failure, NYHA class 3 ([place]) 03/01/2015  . Hyperthyroidism 11/06/2013  . Mass of adrenal gland ([place]) 11/04/2013  . Tobacco abuse 11/04/2013  . Hyponatremia 10/31/2013  . COPD (chronic obstructive pulmonary disease) ([place]) 10/31/2013    Orientation RESPIRATION BLADDER Height & Weight     Self, Time, Place  Normal Continent Weight: 102 lb 15.3 oz (46.7 kg) Height:  5\' 9"  (175.3 cm)  BEHAVIORAL SYMPTOMS/MOOD NEUROLOGICAL BOWEL NUTRITION STATUS     (NONE) Continent Diet (Regular)  AMBULATORY STATUS COMMUNICATION OF  NEEDS Skin   Supervision Verbally Normal                       Personal Care Assistance Level of Assistance  Bathing, Feeding, Dressing Bathing Assistance: Limited assistance (supervision) Feeding assistance: Independent Dressing Assistance: Limited assistance (supervision)     Functional Limitations Info  Sight, Hearing, Speech Sight Info: Adequate Hearing Info: Impaired Speech Info: Adequate    SPECIAL CARE FACTORS FREQUENCY  PT (By licensed PT)                    Contractures Contractures Info: Not present    Additional Factors Info  Isolation Precautions Code Status Info: FULL code status Allergies Info: NKDA Psychotropic Info: NA Insulin Sliding Scale Info: NA Isolation Precautions Info: Contact Precautions: 10/12/15 MRSA by PCR     Current Medications (12/05/2015):  This is the current hospital active medication list Current Facility-Administered Medications  Medication Dose Route Frequency Provider Last Rate Last Dose  . acetaminophen (TYLENOL) tablet 650 mg  650 mg Oral Q6H PRN Modena Jansky, MD   650 mg at 12/05/15 1153  . albuterol (PROVENTIL) (2.5 MG/3ML) 0.083% nebulizer solution 2.5 mg  2.5 mg Nebulization Q6H PRN Etta Quill, DO   2.5 mg at 12/03/15 2113  . aspirin EC tablet 81 mg  81 mg Oral Daily Etta Quill, DO   81 mg at 12/05/15 0948  . heparin injection 5,000 Units  5,000 Units Subcutaneous 3 times per day Etta Quill, DO   5,000 Units at 12/04/15 2300  .  methimazole (TAPAZOLE) tablet 10 mg  10 mg Oral BID Etta Quill, DO   10 mg at 12/05/15 1153  . milk and molasses enema  1 enema Rectal Once Modena Jansky, MD   250 mL at 12/04/15 2325  . mometasone-formoterol (DULERA) 200-5 MCG/ACT inhaler 2 puff  2 puff Inhalation BID Etta Quill, DO   2 puff at 12/04/15 [id number]  . multivitamin with minerals tablet 1 tablet  1 tablet Oral Daily Etta Quill, DO   1 tablet at 12/05/15 [phone]  . polyethylene glycol (MIRALAX / GLYCOLAX)  packet 17 g  17 g Oral BID Modena Jansky, MD   17 g at 12/05/15 0948  . senna (SENOKOT) tablet 17.2 mg  2 tablet Oral BID Modena Jansky, MD         Discharge Medications: Please see discharge summary for a list of discharge medications.  Relevant Imaging Results:  Relevant Lab Results:   Additional Information SSN: [id number]  Kasem Mozer, [place] Better A, LCSW

## 2015-12-05 NOTE — Progress Notes (Signed)
Patient refused milk/molassus enema twice today. Patient stated he will drink something but does not want enema. Will continue to monitor.

## 2015-12-05 NOTE — Progress Notes (Signed)
CSW following for potential disposition planning.  Pt sleeping at this time and CSW unable to assess.   Pt is followed by [facility] in the community who have been assisting with resources from the community and [facility] was looking into Assisted Living Facilities for pt care to be managed until pt can live independently again. CSW spoke with case worker with [facility] regarding pt care and pt has agreed to case worker to explore ALF.   CSW will initiate search for ALF to determine if any facility has Medicaid beds available.  Case worker with [facility], [facility] can continue to follow in the community from when pt discharges if Medicaid ALF bed not available when pt medically ready.   CSW to continue to follow.   Alison Murray, MSW, [place] Work [phone]

## 2015-12-05 NOTE — Progress Notes (Signed)
Agree with assessment form Drue Dun RN.

## 2015-12-05 NOTE — Care Management Important Message (Signed)
Important Message  Patient Details IM Letter given to Nora/Case Manager to present to Patient Name: TABB NORIEGA MRN: [id number] Date of Birth: [birth year]   Medicare Important Message Given:  Yes    Camillo Flaming 12/05/2015, 12:41 [place] Message  Patient Details  Name: MANA REUTER MRN: [id number] Date of Birth: 08/20/1951   Medicare Important Message Given:  Yes    Camillo Flaming 12/05/2015, 12:41 PM

## 2015-12-06 DIAGNOSIS — M6282 Rhabdomyolysis: Secondary | ICD-10-CM

## 2015-12-06 LAB — HEPATIC FUNCTION PANEL
ALK PHOS: 37 U/L — AB (ref 38–126)
ALT: 86 U/L — AB (ref 17–63)
AST: 185 U/L — ABNORMAL HIGH (ref 15–41)
Albumin: 2.8 g/dL — ABNORMAL LOW (ref 3.5–5.0)
BILIRUBIN TOTAL: 0.3 mg/dL (ref 0.3–1.2)
Bilirubin, Direct: <0.1 mg/dL — ABNORMAL LOW (ref 0.1–0.5)
TOTAL PROTEIN: 4.8 g/dL — AB (ref 6.5–8.1)

## 2015-12-06 LAB — CK: Total CK: 918 U/L — ABNORMAL HIGH (ref 49–397)

## 2015-12-06 LAB — FERRITIN: Ferritin: 127 ng/mL (ref 24–336)

## 2015-12-06 LAB — GLUCOSE, CAPILLARY: Glucose-Capillary: 140 mg/dL — ABNORMAL HIGH (ref 65–99)

## 2015-12-06 MED ORDER — MILK AND MOLASSES ENEMA
1.0000 | Freq: Once | RECTAL | Status: AC
  Administered 2015-12-06: 250 mL via RECTAL
  Filled 2015-12-06: qty 250

## 2015-12-06 MED ORDER — MAGNESIUM CITRATE PO SOLN
1.0000 | Freq: Once | ORAL | Status: AC
  Administered 2015-12-06: 1 via ORAL
  Filled 2015-12-06: qty 296

## 2015-12-06 NOTE — Progress Notes (Signed)
PT Cancellation Note  Patient Details Name: [name] MRN: [id number] DOB: 1951/03/17   Cancelled Treatment:     per RN pt amb in hallway earlier and just received an enema.  Will try back later as schedule permits.  Pt has been evaluated by an LPT with no HH or equipment needs. Returning home on D/C.   Nathanial Rancher 12/06/2015, 12:51 PM

## 2015-12-06 NOTE — Clinical Social Work Placement (Signed)
   CLINICAL SOCIAL WORK PLACEMENT  NOTE  Date:  12/06/2015  Patient Details  Name: Anthony Bolton MRN: [id number] Date of Birth: [birth year]  Clinical Social Work is seeking post-discharge placement for this patient at the [place] level of care (*CSW will initial, date and re-position this form in  chart as items are completed):  Yes   Patient/family provided with [facility] Work Department's list of facilities offering this level of care within the geographic area requested by the patient (or if unable, by the patient's family).  Yes   Patient/family informed of their freedom to choose among providers that offer the needed level of care, that participate in Medicare, Medicaid or managed care program needed by the patient, have an available bed and are willing to accept the patient.      Patient/family informed of [facility]'s ownership interest in [facility] and [facility], as well as of the fact that they are under no obligation to receive care at these facilities.  PASRR submitted to EDS on 12/05/15     PASRR number received on 12/05/15     Existing PASRR number confirmed on       FL2 transmitted to all facilities in geographic area requested by pt/family on 12/05/15     FL2 transmitted to all facilities within larger geographic area on       Patient informed that his/her managed care company has contracts with or will negotiate with certain facilities, including the following:        Yes   Patient/family informed of bed offers received.  Patient chooses bed at Other - please specify in the comment section below: [facility])     Physician recommends and patient chooses bed at      Patient to be transferred to   on  .  Patient to be transferred to facility by       Patient family notified on   of transfer.  Name of family member notified:        PHYSICIAN Please sign FL2     Additional Comment:     _______________________________________________ Ladell Pier, LCSW 12/06/2015, 5:37 PM

## 2015-12-06 NOTE — Progress Notes (Signed)
PROGRESS NOTE    Anthony Bolton  [id number]  DOB: 08/31/1951  DOA: 12/02/2015 PCP: [name], MD Outpatient Specialists:   Hospital course: 65 year old male with a history of COPD, systolic CHF, hyperlipidemia, and continued tobacco use presented with three-day history of increasing generalized weakness to the point where he was unable to get out of bed to eat any food or take any of his medications. He states that he has run out of his medications for the past few weeks. He has been having some abdominal pain. He stated that he had not had a bowel movement in over 5 days. Upon presentation, the patient was noted to have WBC 21.5 and elevated serum creatinine 1.00. Lactic acid peaked at 1.9. The patient was admitted for dehydration and starvation ketosis. Treating for constipation/fecal impaction. Evaluating for abnormal LFTs.   Assessment & Plan:   Fecal impaction/abdominal pain -There was initial concern for bowel obstruction. -Abdominal x-ray shows stool-filled colon with gaseous distention of the small bowel concerning for possible obstruction -The patient was discharged from the hospital on 10/04/2015 after conservative treatment for SBO -CT abdomen pelvis: No acute findings and no bowel obstruction. Moderate stool burden. - Started on aggressive bowel regimen. Over the last 2 days, despite initially agreeing, patient refused enemas. He finally agreed and received an enema on 3/10 with good effect. -Follow KUB in a.m.   Leukocytosis -Initial concern for intra-abdominal process -Chest x-ray negative for infiltrates -Urinalysis negative for pyuria -Lactic acid 1.9 -Blood cultures 2 sets: Negative to date. - Resolved.? Reactive.  Starvation ketosis/malnutrition/Dehydration -Nutritionist consult-please see below. -Dehydration resolved. Discontinue IV fluids.  COPD -Presently stable on room air -Restarted Dulera -Duo nebs for intermittent  wheezing  Hyperlipidemia -Statins held secondary to abnormal LFTs.  Hyperthyroidism -Restarted Tapazole -No clinical evidence of thyroid storm -TSH 0.052, free T4 [id number]  Chronic systolic CHF -[id number] echo EF 50-55 percent -This has improved from previous EF 20-25% -Clinically euvolemic  Transaminasemia -d/c statin. Unclear etiology. CT abdomen without acute findings. Follow LFTs in a.m. -HIV: Nonreactive -UDS -Hepatitis B surface antigen: Negative, hepatitis C antibody <0.1.  - Denies alcohol use. No GI symptoms reported at this time except constipation - Check RUQ ultrasound-negative, hepatitis A workup-pending, serum ferritin/127  and total CK 918.  - His abnormal LFTs may have been due to rhabdomyolysis-etiology unclear-no reported history of falls. Cannot tell if it's secondary to statins which are currently on hold.  - LFTs improving.  Soft blood pressures - Asymptomatic. Appears chronic and intermittent. As per patient, usually has low blood pressures.  Hypokalemia - Replaced.  Anemia - Stable.  DVT prophylaxis: Subcutaneous heparin Code Status: Full Family Communication: None at bedside Disposition Plan: DC home when medically stable, possibly 3/11.   Consultants:  None  Procedures:  TB skin test placed 3/9 as per clinical social worker recommendations-required for community ALF placement.  Antimicrobials:  None   Subjective: "I'm trying to have a BM". As per RN, following enema, patient had 3 moderate stool BM.   Objective: Filed Vitals:   12/05/15 0615 12/05/15 1500 12/05/15 2109 12/06/15 0631  BP: 123/75 98/69 118/77 122/82  Pulse: 86 79 77 75  Temp: 98.4 F (36.9 C) 99.4 F (37.4 C) 98.6 F (37 C) 97.9 F (36.6 C)  TempSrc: Oral Oral Oral Oral  Resp: 18 18 18 18   Height:      Weight:      SpO2: 97% 95% 98% 98%    Intake/Output Summary (Last 24 hours)  at 12/06/15 1451 Last data filed at 12/06/15 0804  Gross per 24 hour  Intake     720 ml  Output    120 ml  Net    600 ml   Filed Weights   12/02/15 1729 12/02/15 2156  Weight: 48.535 kg (107 lb) 46.7 kg (102 lb 15.3 oz)    Exam:  General exam: Moderately built and thinly nourished frail middle-aged male, looks older than stated age, lying comfortably supine in bed. Extremely hard of hearing. Respiratory system: Clear. No increased work of breathing. Cardiovascular system: S1 & S2 heard, RRR. No JVD, murmurs, gallops, clicks or pedal edema. Gastrointestinal system: Abdomen is nondistended, soft and nontender. Normal bowel sounds heard.Lower abdominal laparotomy scar.  Central nervous system: Alert and oriented. No focal neurological deficits. Extremities: Symmetric 5 x 5 power.   Data Reviewed: Basic Metabolic Panel:  Recent Labs Lab 12/02/15 1805 12/02/15 2059 12/03/15 0027 12/04/15 0344 12/05/15 0332  NA 134*  --  132* 137 141  K 4.5  --  3.9 3.3* 3.6  CL 101  --  106 110 110  CO2 17*  --  17* 22 25  GLUCOSE 93  --  92 106* 108*  BUN 37*  --  30* 13 10  CREATININE 1.00  --  0.85 0.47* 0.59*  CALCIUM 8.8*  --  7.4* 8.0* 7.9*  MG  --  2.0  --   --   --   PHOS  --  4.4  --   --   --    Liver Function Tests:  Recent Labs Lab 12/02/15 1805 12/04/15 0344 12/05/15 0332 12/06/15 0323  AST 170* 497* 334* 185*  ALT 30 90* 94* 86*  ALKPHOS 58 38 34* 37*  BILITOT 0.4 0.4 0.2* 0.3  PROT 6.9 4.9* 4.7* 4.8*  ALBUMIN 3.9 3.1* 2.7* 2.8*   No results for input(s): LIPASE, AMYLASE in the last 168 hours. No results for input(s): AMMONIA in the last 168 hours. CBC:  Recent Labs Lab 12/02/15 1805 12/03/15 0027 12/04/15 0344 12/05/15 0332  WBC 21.5* 18.5* 12.0* 9.5  HGB 14.7 12.4* 10.3* 10.4*  HCT 44.4 37.2* 30.6* 31.4*  MCV 88.8 89.0 88.2 90.5  PLT 215 176 150 158   Cardiac Enzymes:  Recent Labs Lab 12/06/15 0323  CKTOTAL 918*   BNP (last 3 results) No results for input(s): PROBNP in the last 8760 hours. CBG:  Recent Labs Lab  12/02/15 1809 12/06/15 0830  GLUCAP 90 140*    Recent Results (from the past 240 hour(s))  MRSA PCR Screening     Status: None   Collection Time: 12/03/15  8:26 AM  Result Value Ref Range Status   MRSA by PCR NEGATIVE NEGATIVE Final    Comment:        The GeneXpert MRSA Assay (FDA approved for NASAL specimens only), is one component of a comprehensive MRSA colonization surveillance program. It is not intended to diagnose MRSA infection nor to guide or monitor treatment for MRSA infections.   Culture, blood (Routine X 2) w Reflex to ID Panel     Status: None (Preliminary result)   Collection Time: 12/03/15 11:07 AM  Result Value Ref Range Status   Specimen Description BLOOD RIGHT ARM  Final   Special Requests BOTTLES DRAWN AEROBIC ONLY 5CC  Final   Culture   Final    NO GROWTH 3 DAYS Performed at [facility]    Report Status PENDING  Incomplete  Culture, blood (Routine  X 2) w Reflex to ID Panel     Status: None (Preliminary result)   Collection Time: 12/03/15 11:10 AM  Result Value Ref Range Status   Specimen Description BLOOD RIGHT HAND  Final   Special Requests BOTTLES DRAWN AEROBIC AND ANAEROBIC 5CC  Final   Culture   Final    NO GROWTH 3 DAYS Performed at [facility]    Report Status PENDING  Incomplete         Studies: US Abdomen Limited Ruq  Dec 07, 2015  CLINICAL DATA:  Abnormal LFT EXAM: US ABDOMEN LIMITED - RIGHT UPPER QUADRANT COMPARISON:  CT 12/03/2015 FINDINGS: Gallbladder: No gallstones or wall thickening visualized. No sonographic Murphy sign noted by sonographer. Common bile duct: Diameter: 2.9 mm Liver: No focal lesion identified. Within normal limits in parenchymal echogenicity. IMPRESSION: Negative right upper quadrant ultrasound.  No gallstones. Electronically Signed   By: Franchot Gallo M.D.   On: 12-07-15 21:21        Scheduled Meds: . aspirin EC  81 mg Oral Daily  . heparin  5,000 Units Subcutaneous 3 times per day  .  methimazole  10 mg Oral BID  . mometasone-formoterol  2 puff Inhalation BID  . multivitamin with minerals  1 tablet Oral Daily  . polyethylene glycol  17 g Oral BID  . senna  2 tablet Oral BID  . tuberculin  5 Units Intradermal Once   Continuous Infusions:    Principal Problem:   Malnutrition due to starvation [facility]) Active Problems:   COPD (chronic obstructive pulmonary disease) (HCC)   Chronic systolic congestive heart failure, NYHA class 3 (HCC)   Malnutrition of moderate degree (HCC)   Hard of hearing   High anion gap metabolic acidosis   Leukocytosis   Transaminasemia   Dehydration    Time spent: 25 minutes.    Vernell Leep, MD, FACP, FHM. [facility] Pager [phone]  If 7PM-7AM, please contact night-coverage [email or web address] Password [email or web address] 12/06/2015, 2:51 PM    LOS: 4 days

## 2015-12-06 NOTE — NC FL2 (Signed)
[place] MEDICAID FL2 LEVEL OF CARE SCREENING TOOL     IDENTIFICATION  Patient Name: Anthony Bolton Birthdate: 09/01/1951 Sex: male Admission Date (Current Location): 12/02/2015  [facility] and [place] Number:  Herbalist and Address:  [facility],  [place], [place]      Provider Number: [id number]  Attending Physician Name and Address:  Modena Jansky, MD  Relative Name and Phone Number:       Current Level of Care: Hospital Recommended Level of Care: [place] Prior Approval Number:    Date Approved/Denied: 04/29/15 PASRR Number: [id number]  Discharge Plan: Other (Comment) ([place])    Current Diagnoses: Patient Active Problem List   Diagnosis Date Noted  . Transaminasemia 12/03/2015  . Dehydration   . High anion gap metabolic acidosis [id number]  . Malnutrition due to starvation ([place]) 12/02/2015  . Leukocytosis 12/02/2015  . Abdominal pain 10/12/2015  . Nausea & vomiting 10/12/2015  . Chest wall pain 10/12/2015  . AKI (acute kidney injury) ([place]) 10/12/2015  . SBO (small bowel obstruction) ([place]) 10/12/2015  . Acute respiratory failure ([place]) 08/11/2015  . Hard of hearing 08/11/2015  . CAD (nonobstructive per cath 2015) 08/11/2015  . HTN (hypertension) 08/11/2015  . HLD (hyperlipidemia)   . Malnutrition of moderate degree ([place]) 03/02/2015  . COPD exacerbation ([place]) 03/01/2015  . Chronic systolic congestive heart failure, NYHA class 3 ([place]) 03/01/2015  . Hyperthyroidism 11/06/2013  . Mass of adrenal gland ([place]) 11/04/2013  . Tobacco abuse 11/04/2013  . Hyponatremia 10/31/2013  . COPD (chronic obstructive pulmonary disease) ([place]) 10/31/2013    Orientation RESPIRATION BLADDER Height & Weight     Self, Time, Place  Normal Continent Weight: 102 lb 15.3 oz (46.7 kg) Height:  5\' 9"  (175.3 cm)  BEHAVIORAL SYMPTOMS/MOOD NEUROLOGICAL BOWEL NUTRITION STATUS     (NONE) Continent Diet  (Regular)  AMBULATORY STATUS COMMUNICATION OF NEEDS Skin   Supervision Verbally Normal                       Personal Care Assistance Level of Assistance  Bathing, Feeding, Dressing Bathing Assistance: Limited assistance (supervision) Feeding assistance: Independent Dressing Assistance: Limited assistance (supervision)     Functional Limitations Info  Sight, Hearing, Speech Sight Info: Adequate Hearing Info: Impaired Speech Info: Adequate    SPECIAL CARE FACTORS FREQUENCY  PT (By licensed PT)                    Contractures Contractures Info: Not present    Additional Factors Info  Isolation Precautions Code Status Info: FULL code status Allergies Info: NKDA Psychotropic Info: NA Insulin Sliding Scale Info: NA Isolation Precautions Info: Contact Precautions: 10/12/15 MRSA by PCR     Current Medications (12/06/2015):  This is the current hospital active medication list Current Facility-Administered Medications  Medication Dose Route Frequency Provider Last Rate Last Dose  . acetaminophen (TYLENOL) tablet 650 mg  650 mg Oral Q6H PRN Modena Jansky, MD   650 mg at 12/05/15 1153  . albuterol (PROVENTIL) (2.5 MG/3ML) 0.083% nebulizer solution 2.5 mg  2.5 mg Nebulization Q6H PRN Etta Quill, DO   2.5 mg at 12/03/15 2113  . aspirin EC tablet 81 mg  81 mg Oral Daily Etta Quill, DO   81 mg at 12/05/15 [id number]  . heparin injection 5,000 Units  5,000 Units Subcutaneous 3 times per day Etta Quill, DO   Stopped at 12/05/15  2205  . magnesium citrate solution 1 Bottle  1 Bottle Oral Once Modena Jansky, MD      . methimazole (TAPAZOLE) tablet 10 mg  10 mg Oral BID Etta Quill, DO   10 mg at 12/05/15 2110  . milk and molasses enema  1 enema Rectal Once Modena Jansky, MD      . mometasone-formoterol [facility]) 200-5 MCG/ACT inhaler 2 puff  2 puff Inhalation BID Etta Quill, DO   2 puff at 12/04/15 [id number]  . multivitamin with minerals tablet 1 tablet  1  tablet Oral Daily Etta Quill, DO   1 tablet at 12/05/15 0948  . polyethylene glycol (MIRALAX / GLYCOLAX) packet 17 g  17 g Oral BID Modena Jansky, MD   17 g at 12/05/15 2110  . senna (SENOKOT) tablet 17.2 mg  2 tablet Oral BID Modena Jansky, MD   17.2 mg at 12/05/15 2110  . tuberculin injection 5 Units  5 Units Intradermal Once Modena Jansky, MD   5 Units at 12/05/15 1843     Discharge Medications: Please see discharge summary for a list of discharge medications.  Relevant Imaging Results:  Relevant Lab Results:   Additional Information SSN: [id number]  KIDD, Hughes Better A, LCSW

## 2015-12-06 NOTE — Clinical Social Work Note (Signed)
Clinical Social Work Assessment  Patient Details  Name: Anthony Bolton MRN: [id number] Date of Birth: 1951/05/02  Date of referral:  12/06/15               Reason for consult:  Discharge Planning                Permission sought to share information with:  Other ([facility] [employer] caseworker, [facility]) Permission granted to share information::     Name::        Agency::     Relationship::     Contact Information:     Housing/Transportation Living arrangements for the past 2 months:  Hotel/Motel Source of Information:  Patient, Other (Comment Required) ([facility] [employer] caseworker, [facility]) Patient Interpreter Needed:  None Criminal Activity/Legal Involvement Pertinent to Current Situation/Hospitalization:  No - Comment as needed Significant Relationships:  None Lives with:  Self Do you feel safe going back to the place where you live?  No Need for family participation in patient care:  No (Coment)  Care giving concerns:  Pt admitted from motel where pt lives alone. Motel has bed bugs. Pt agreeable to ALF & [facility] was assisting pt prior to admission which ALF process.    Social Worker assessment / plan:    CSW following for psychosocial/disposition needs.   Met with pt at bedside. Pt wants to go to ALF & concerned about returning to motel if he cannot go to ALF straight from [place] ALF came to assess pt today and can accept pt if d/c over the weekend pending TB test being read and pt being provided bridge of medications until Monday when ALF can obtain medications from the pharmacy. CSW has inquired with hospital pharmacy about if bridge medications can be provided.   [facility] caseworker, [facility] updated via telephone.   CSW received phone call from Devoria Albe from [employer] and APS was investigating pt living conditions.   CSW to follow up tomorrow to  determine if pt medically ready and if it can be arranged for pt to go to ALF. Pt is not safe to discharge back to motel at this time due to care needs.   Employment status:  Retired Forensic scientist:  Information systems manager, Medicaid In [place] PT Recommendations:  No Follow Up Information / Referral to community resources:  Other (Comment Required) (Assisted Living)  Patient/Family's Response to care:  Pt alert and oriented. Pt not feeling well. Pt agreeable to ALF. CSW has been working diligently with [facility] [employer] caseworker, [facility], [facility] caseworker, Devoria Albe, and [facility] ALF representative, Aram Beecham to make attempts for pt to go straight to ALF from hospital.   Patient/Family's Understanding of and Emotional Response to Diagnosis, Current Treatment, and Prognosis:  Pt aware that he cannot manage his care back at the motel that is not a good living environment and pt is alone.   Emotional Assessment Appearance:  Appears stated age Attitude/Demeanor/Rapport:  Guarded Affect (typically observed):  Appropriate Orientation:  Oriented to Self, Oriented to Place Alcohol / Substance use:  Not Applicable Psych involvement (Current and /or in the community):  No (Comment)  Discharge Needs  Concerns to be addressed:  Discharge Planning Concerns, Home Safety Concerns Readmission within the last 30 days:  No Current discharge risk:  Lack of support system Barriers to Discharge:  Continued Medical Work up   Alison Murray A, LCSW 12/06/2015,  5:31 PM  [phone]

## 2015-12-07 ENCOUNTER — Inpatient Hospital Stay (HOSPITAL_COMMUNITY): Payer: Medicare Other

## 2015-12-07 DIAGNOSIS — E860 Dehydration: Principal | ICD-10-CM

## 2015-12-07 DIAGNOSIS — I5022 Chronic systolic (congestive) heart failure: Secondary | ICD-10-CM

## 2015-12-07 DIAGNOSIS — D72829 Elevated white blood cell count, unspecified: Secondary | ICD-10-CM

## 2015-12-07 DIAGNOSIS — H9193 Unspecified hearing loss, bilateral: Secondary | ICD-10-CM

## 2015-12-07 LAB — HEPATITIS A ANTIBODY, IGM: Hep A IgM: NEGATIVE

## 2015-12-07 MED ORDER — IBUPROFEN 200 MG PO TABS
400.0000 mg | ORAL_TABLET | Freq: Four times a day (QID) | ORAL | Status: DC | PRN
  Filled 2015-12-07 (×2): qty 4

## 2015-12-07 MED ORDER — ALBUTEROL SULFATE (2.5 MG/3ML) 0.083% IN NEBU: 2.5000 mg | INHALATION_SOLUTION | Freq: Four times a day (QID) | RESPIRATORY_TRACT | Status: DC | PRN

## 2015-12-07 MED ORDER — ADULT MULTIVITAMIN W/MINERALS CH: 1.0000 | ORAL_TABLET | Freq: Every day | ORAL | Status: DC

## 2015-12-07 MED ORDER — SENNA 8.6 MG PO TABS: 2.0000 | ORAL_TABLET | Freq: Every day | ORAL | Status: DC | PRN

## 2015-12-07 MED ORDER — ALBUTEROL SULFATE HFA 108 (90 BASE) MCG/ACT IN AERS
2.0000 | INHALATION_SPRAY | Freq: Four times a day (QID) | RESPIRATORY_TRACT | Status: DC | PRN
  Filled 2015-12-07: qty 6.7

## 2015-12-07 MED ORDER — ASPIRIN 81 MG PO TBEC: 81.0000 mg | DELAYED_RELEASE_TABLET | Freq: Every day | ORAL | Status: DC

## 2015-12-07 MED ORDER — POLYETHYLENE GLYCOL 3350 17 G PO PACK: 17.0000 g | PACK | Freq: Every day | ORAL | Status: DC

## 2015-12-07 MED ORDER — BUDESONIDE-FORMOTEROL FUMARATE 160-4.5 MCG/ACT IN AERO: 2.0000 | INHALATION_SPRAY | Freq: Two times a day (BID) | RESPIRATORY_TRACT | Status: DC

## 2015-12-07 NOTE — Discharge Summary (Addendum)
Physician Discharge Summary  Anthony Bolton  [id number]  DOB: May 03, 1951  DOA: 12/02/2015  PCP: [name], MD  Admit date: 12/02/2015 Discharge date: 12/07/2015  Time spent: Greater than 30 minutes  Recommendations for Outpatient Follow-up:  1. Dr. Lucianne Lei, PCP in 1 week with repeat labs (CBC, CMP and total CK). Please follow final blood culture results that were sent from the hospital. 2. Tuberculin skin test done in the hospital was negative. 3. Recommend repeating full TFTs in 3-4 weeks 4. Patient had initially agreed and arrangements were made for him to go to assisted living facility but he finally declined and wishes to go home.  Discharge Diagnoses:  Principal Problem:   Malnutrition due to starvation [facility]) Active Problems:   COPD (chronic obstructive pulmonary disease) (HCC)   Chronic systolic congestive heart failure, NYHA class 3 (HCC)   Malnutrition of moderate degree (HCC)   Hard of hearing   High anion gap metabolic acidosis   Leukocytosis   Transaminasemia   Dehydration   Discharge Condition: Improved & Stable  Diet recommendation: Heart healthy diet.  Filed Weights   12/02/15 1729 12/02/15 2156  Weight: 48.535 kg (107 lb) 46.7 kg (102 lb 15.3 oz)    History of present illness:  65 year old male with a history of COPD, systolic CHF, hyperlipidemia, and continued tobacco use presented with three-day history of increasing generalized weakness to the point where he was unable to get out of bed to eat any food or take any of his medications. He states that he has run out of his medications for the past few weeks. He has been having some abdominal pain. He stated that he had not had a bowel movement in over 5 days. Upon presentation, the patient was noted to have WBC 21.5 and elevated serum creatinine 1.00. Lactic acid peaked at 1.9. The patient was admitted for dehydration and starvation ketosis. He was evaluated and treated for fecal impaction, elevated  LFTs. He has clinically improved. Prior to hospitalization, attempts were being made for patient to be placed in assisted living facility from the community due to his poor living conditions. As per discussion with clinical social worker today, patient will go to assisted living facility directly from the hospital upon discharge today.  Hospital Course:   Fecal impaction/abdominal pain -There was initial concern for bowel obstruction. -Abdominal x-ray shows stool-filled colon with gaseous distention of the small bowel concerning for possible obstruction -The patient was discharged from the hospital on 10/04/2015 after conservative treatment for SBO -CT abdomen pelvis: No acute findings and no bowel obstruction. Moderate stool burden. - After refusing enemas for a couple of days, he finally consented and received an enema on 12/06/15 with good effect. Patient continues to have multiple BMs with stools. X-rays still show mild to moderate stool burden but no clinical or radiological features of bowel obstruction. We will reduce dose of MiraLAX and senna.   Leukocytosis -Initial concern for intra-abdominal process -Chest x-ray negative for infiltrates -Urinalysis negative for pyuria -Lactic acid 1.9 -Blood cultures 2 sets: Negative to date. - Resolved.? Reactive.  Starvation ketosis/malnutrition/Dehydration -Nutritionist consult-please see below. -Dehydration resolved.  COPD -Presently stable on room air -Restarted Dulera - Stable  Hyperlipidemia - Patient has not been taking several of his home medications for an undetermined period of time. Statins not resumed especially given rhabdomyolysis. This can be reassessed in the future.   Hyperthyroidism -No clinical evidence of thyroid storm. Clinically appears euthyroid.  -TSH 0.052, free T4 0.69 -  As indicated above, it appears as though patient has not taken several of his medications for a long time. Although methimazole was resumed in  the hospital, will not continue it at discharge. Recommend monitoring clinically for features of hyperthyroidism and recheck full TFTs in 3-4 weeks.   Chronic systolic CHF -[id number] echo EF 50-55 percent -This has improved from previous EF 20-25% -Clinically euvolemic  Abnormal LFTs - Unclear etiology. CT abdomen without acute findings.  -HIV: Nonreactive -Hepatitis B surface antigen: Negative, hepatitis C antibody <0.1.  - Denies alcohol use. No GI symptoms reported except constipation - Check RUQ ultrasound-negative, hepatitis A IgM: Negative, serum ferritin/127 and total CK 918.  - His abnormal LFTs may have been due to rhabdomyolysis-etiology unclear-no reported history of falls.  - LFTs improving. Outpatient follow-up.  Soft blood pressures - Asymptomatic. Appears chronic and intermittent. As per patient, usually has low blood pressures.  Hypokalemia - Replaced.  Anemia - Stable.   Rhabdomyolysis - Unclear etiology. No reported history of falls. Does not appear to be taking statins prior to admission. Asymptomatic. LFT abnormality may be secondary to this. Follow CK as OP.  Hard of hearing  Adult failure to thrive - As per clinical social worker, poor living conditions prior to admission and plan was for him to go to assisted living facility which she had agreed to but then finally declined and hence will return home.  Consultants:  None  Procedures:  TB skin test placed 3/9 and red 12/07/15 and negative.  Antimicrobials:  None   Discharge Exam:  Complaints: Patient having multiple BMs after enema on 3/10. Denies abdominal pain, nausea or vomiting. As per RN, no acute issues reported.   Filed Vitals:   12/06/15 1803 12/06/15 2100 12/07/15 0638 12/07/15 0855  BP:  92/63 110/81   Pulse:  86 80   Temp:  99.3 F (37.4 C) 99.1 F (37.3 C)   TempSrc:  Oral Oral   Resp:  18 18   Height:      Weight:      SpO2: 95% 97% 100% 99%    General exam:  Moderately built and thinly nourished frail middle-aged male, looks older than stated age, lying comfortably supine in bed. Extremely hard of hearing. Respiratory system: Clear. No increased work of breathing. Cardiovascular system: S1 & S2 heard, RRR. No JVD, murmurs, gallops, clicks or pedal edema. Gastrointestinal system: Abdomen is nondistended, soft and nontender. Normal bowel sounds heard.Lower abdominal laparotomy scar.  Central nervous system: Alert and oriented. No focal neurological deficits. Extremities: Symmetric 5 x 5 power.  Discharge Instructions      Discharge Instructions    Call MD for:  difficulty breathing, headache or visual disturbances    Complete by:  As directed      Call MD for:  extreme fatigue    Complete by:  As directed      Call MD for:  persistant dizziness or light-headedness    Complete by:  As directed      Call MD for:  persistant nausea and vomiting    Complete by:  As directed      Call MD for:  severe uncontrolled pain    Complete by:  As directed      Call MD for:  temperature >100.4    Complete by:  As directed      Diet - low sodium heart healthy    Complete by:  As directed      Increase activity slowly  Complete by:  As directed             Medication List    STOP taking these medications        atorvastatin 40 MG tablet  Commonly known as:  LIPITOR     levofloxacin 750 MG tablet  Commonly known as:  LEVAQUIN     methimazole 10 MG tablet  Commonly known as:  TAPAZOLE     tiotropium 18 MCG inhalation capsule  Commonly known as:  SPIRIVA HANDIHALER      TAKE these medications        albuterol 108 (90 Base) MCG/ACT inhaler  Commonly known as:  PROVENTIL HFA;VENTOLIN HFA  Inhale 2 puffs into the lungs every 6 (six) hours as needed for wheezing or shortness of breath.     albuterol (2.5 MG/3ML) 0.083% nebulizer solution  Commonly known as:  PROVENTIL  Take 3 mLs (2.5 mg total) by nebulization every 6 (six) hours as  needed for wheezing or shortness of breath.     aspirin 81 MG EC tablet  Take 1 tablet (81 mg total) by mouth daily.     budesonide-formoterol 160-4.5 MCG/ACT inhaler  Commonly known as:  SYMBICORT  Inhale 2 puffs into the lungs 2 (two) times daily.     ibuprofen 200 MG tablet  Commonly known as:  ADVIL,MOTRIN  Take 400 mg by mouth every 6 (six) hours as needed for headache, mild pain or moderate pain.     multivitamin with minerals Tabs tablet  Take 1 tablet by mouth daily.     polyethylene glycol packet  Commonly known as:  MIRALAX / GLYCOLAX  Take 17 g by mouth daily.     senna 8.6 MG Tabs tablet  Commonly known as:  SENOKOT  Take 2 tablets (17.2 mg total) by mouth daily as needed for mild constipation or moderate constipation.       Follow-up Information    Follow up with [name], MD. Schedule an appointment as soon as possible for a visit in 1 week.   Specialty:  Family Medicine   Why:  To be seen with repeat labs (CBC, CMP & CK)   Contact information:   [address] [place] [address] [phone]       Get Medicines reviewed and adjusted: Please take all your medications with you for your next visit with your Primary MD  Please request your Primary MD to go over all hospital tests and procedure/radiological results at the follow up. Please ask your Primary MD to get all Hospital records sent to his/her office.  If you experience worsening of your admission symptoms, develop shortness of breath, life threatening emergency, suicidal or homicidal thoughts you must seek medical attention immediately by calling 911 or calling your MD immediately if symptoms less severe.  You must read complete instructions/literature along with all the possible adverse reactions/side effects for all the Medicines you take and that have been prescribed to you. Take any new Medicines after you have completely understood and accept all the possible adverse reactions/side  effects.   Do not drive when taking pain medications.   Do not take more than prescribed Pain, Sleep and Anxiety Medications  Special Instructions: If you have smoked or chewed Tobacco in the last 2 yrs please stop smoking, stop any regular Alcohol and or any Recreational drug use.  Wear Seat belts while driving.  Please note  You were cared for by a hospitalist during your hospital stay.  Once you are discharged, your primary care physician will handle any further medical issues. Please note that NO REFILLS for any discharge medications will be authorized once you are discharged, as it is imperative that you return to your primary care physician (or establish a relationship with a primary care physician if you do not have one) for your aftercare needs so that they can reassess your need for medications and monitor your lab values.    The results of significant diagnostics from this hospitalization (including imaging, microbiology, ancillary and laboratory) are listed below for reference.    Significant Diagnostic Studies: Dg Chest 2 View  12/02/2015  CLINICAL DATA:  Shortness of breath. EXAM: CHEST  2 VIEW COMPARISON:  11/20/2015 and 11/08/2015. FINDINGS: The heart size and mediastinal contours are stable. The lungs remain hyperinflated with flattening of the diaphragm. No superimposed airspace disease, pleural effusion or pneumothorax demonstrated. The bones appear unchanged. IMPRESSION: Stable chronic findings of moderate COPD. No acute superimposed process. Electronically Signed   By: Richardean Sale M.D.   On: 12/02/2015 19:01   Dg Chest 2 View  11/20/2015  CLINICAL DATA:  Acute onset of worsening shortness of breath and chest tightness. Initial encounter. EXAM: CHEST  2 VIEW COMPARISON:  Chest radiograph performed 11/08/2015 FINDINGS: The lungs are hyperexpanded, with flattening of the hemidiaphragms, compatible with COPD. Peribronchial thickening is noted. Mildly increased interstitial  markings appear chronic in nature. There is no definite evidence of superimposed focal opacification, pleural effusion or pneumothorax. The heart is normal in size; the mediastinal contour is within normal limits. No acute osseous abnormalities are seen. IMPRESSION: Findings of COPD. No definite superimposed airspace consolidation, though minimal right basilar opacity cannot be entirely excluded, depending on the patient's symptoms. Electronically Signed   By: Garald Balding M.D.   On: 11/20/2015 06:51   Dg Chest 2 View  11/08/2015  CLINICAL DATA:  Shortness of breath and cough for a few days. Initial encounter. EXAM: CHEST  2 VIEW COMPARISON:  PA and lateral chest 10/17/2015, 10/11/2015 and 03/25/2015. CT chest 03/25/2015. FINDINGS: The lungs are markedly emphysematous but clear. No pneumothorax or pleural effusion. Heart size is normal. Remote left clavicle fracture is noted. IMPRESSION: Emphysema without acute disease. Electronically Signed   By: Inge Rise M.D.   On: 11/08/2015 15:52   Dg Abd 1 View  12/07/2015  CLINICAL DATA:  Fecal impaction EXAM: ABDOMEN - 1 VIEW COMPARISON:  CT abdomen pelvis dated 12/03/2015 FINDINGS: No evidence of bowel obstruction. Moderate gaseous distention of the transverse colon. Mild to moderate stool in the distal sigmoid/rectum. Visualized osseous structures are within normal limits. IMPRESSION: Mild to moderate stool in the distal sigmoid/ rectum. Electronically Signed   By: Julian Hy M.D.   On: 12/07/2015 09:58   Dg Abd 1 View  12/02/2015  CLINICAL DATA:  Fever, weakness, and shortness of breath for several days. Abdominal pain. EXAM: ABDOMEN - 1 VIEW COMPARISON:  10/13/2015 FINDINGS: Gas and stool throughout the colon without significant distention. There is gaseous distention of mid abdominal small bowel. Changes could represent small bowel obstruction or severe constipation. No radiopaque stones. Visualized bones appear intact. IMPRESSION: Diffusely  stool-filled colon without distention. Mid abdominal gas-filled small bowel. Changes may represent small bowel obstruction or severe constipation. Electronically Signed   By: Lucienne Capers M.D.   On: 12/02/2015 21:19   Ct Abdomen Pelvis W Contrast  12/03/2015  CLINICAL DATA:  65 year old with a history of COPD and systolic CHF. Weakness. Abdominal pain. EXAM:  CT ABDOMEN AND PELVIS WITH CONTRAST TECHNIQUE: Multidetector CT imaging of the abdomen and pelvis was performed using the standard protocol following bolus administration of intravenous contrast. CONTRAST:  6mL OMNIPAQUE IOHEXOL 300 MG/ML SOLN, 128mL OMNIPAQUE IOHEXOL 300 MG/ML SOLN COMPARISON:  10/12/2015 FINDINGS: Lower chest: There is no pleural effusion identified. The lung bases are clear. Hepatobiliary: No masses or other significant abnormality. Gallbladder is not visualized and may be collapsed. No biliary dilatation noted. Pancreas: No mass, inflammatory changes, or other significant abnormality. Spleen: Within normal limits in size and appearance. Adrenals/Urinary Tract: Left adrenal mass is again identified. This measures 3.7 x 3.8 cm and is unchanged from previous exam compatible with benign adrenal adenoma. No kidney stones or obstructive uropathy identified. Left renal cyst is noted. The urinary bladder is within normal limits. Stomach/Bowel: No pathologic dilatation of the large or small bowel loops. There is moderate stool identified throughout the colon and rectum. The previously noted small bowel obstruction pattern has resolved in the interval. Vascular/lymph nodes: Calcified atherosclerotic disease involves the abdominal aorta. No aneurysm. No enlarged retroperitoneal or mesenteric adenopathy. No enlarged pelvic or inguinal lymph nodes. Reproductive: Prostate gland is enlarged. Other: No free fluid or fluid collections identified. Musculoskeletal: No aggressive lytic or sclerotic bone lesions identified. IMPRESSION: 1. No acute  findings no evidence for bowel obstruction. 2. Moderate stool burden identified within the colon. 3. Aortic atherosclerosis. Electronically Signed   By: Kerby Moors M.D.   On: 12/03/2015 17:05   US Abdomen Limited Ruq  12/05/2015  CLINICAL DATA:  Abnormal LFT EXAM: US ABDOMEN LIMITED - RIGHT UPPER QUADRANT COMPARISON:  CT 12/03/2015 FINDINGS: Gallbladder: No gallstones or wall thickening visualized. No sonographic Murphy sign noted by sonographer. Common bile duct: Diameter: 2.9 mm Liver: No focal lesion identified. Within normal limits in parenchymal echogenicity. IMPRESSION: Negative right upper quadrant ultrasound.  No gallstones. Electronically Signed   By: Franchot Gallo M.D.   On: 12/05/2015 21:21    Microbiology: Recent Results (from the past 240 hour(s))  MRSA PCR Screening     Status: None   Collection Time: 12/03/15  8:26 AM  Result Value Ref Range Status   MRSA by PCR NEGATIVE NEGATIVE Final    Comment:        The GeneXpert MRSA Assay (FDA approved for NASAL specimens only), is one component of a comprehensive MRSA colonization surveillance program. It is not intended to diagnose MRSA infection nor to guide or monitor treatment for MRSA infections.   Culture, blood (Routine X 2) w Reflex to ID Panel     Status: None (Preliminary result)   Collection Time: 12/03/15 11:07 AM  Result Value Ref Range Status   Specimen Description BLOOD RIGHT ARM  Final   Special Requests BOTTLES DRAWN AEROBIC ONLY 5CC  Final   Culture   Final    NO GROWTH 3 DAYS Performed at [facility]    Report Status PENDING  Incomplete  Culture, blood (Routine X 2) w Reflex to ID Panel     Status: None (Preliminary result)   Collection Time: 12/03/15 11:10 AM  Result Value Ref Range Status   Specimen Description BLOOD RIGHT HAND  Final   Special Requests BOTTLES DRAWN AEROBIC AND ANAEROBIC 5CC  Final   Culture   Final    NO GROWTH 3 DAYS Performed at [facility]    Report Status  PENDING  Incomplete     Labs: Basic Metabolic Panel:  Recent Labs Lab 12/02/15 1805 12/02/15 2059  12/03/15 0027 12/04/15 0344 12/05/15 0332  NA 134*  --  132* 137 141  K 4.5  --  3.9 3.3* 3.6  CL 101  --  106 110 110  CO2 17*  --  17* 22 25  GLUCOSE 93  --  92 106* 108*  BUN 37*  --  30* 13 10  CREATININE 1.00  --  0.85 0.47* 0.59*  CALCIUM 8.8*  --  7.4* 8.0* 7.9*  MG  --  2.0  --   --   --   PHOS  --  4.4  --   --   --    Liver Function Tests:  Recent Labs Lab 12/02/15 1805 12/04/15 0344 12/05/15 0332 12/06/15 0323  AST 170* 497* 334* 185*  ALT 30 90* 94* 86*  ALKPHOS 58 38 34* 37*  BILITOT 0.4 0.4 0.2* 0.3  PROT 6.9 4.9* 4.7* 4.8*  ALBUMIN 3.9 3.1* 2.7* 2.8*   No results for input(s): LIPASE, AMYLASE in the last 168 hours. No results for input(s): AMMONIA in the last 168 hours. CBC:  Recent Labs Lab 12/02/15 1805 12/03/15 0027 12/04/15 0344 12/05/15 0332  WBC 21.5* 18.5* 12.0* 9.5  HGB 14.7 12.4* 10.3* 10.4*  HCT 44.4 37.2* 30.6* 31.4*  MCV 88.8 89.0 88.2 90.5  PLT 215 176 150 158   Cardiac Enzymes:  Recent Labs Lab 12/06/15 0323  CKTOTAL 918*   BNP: BNP (last 3 results)  Recent Labs  09/08/15 0516 10/12/15 0517 11/20/15 0738  BNP 61.1 16.0 46.0    ProBNP (last 3 results) No results for input(s): PROBNP in the last 8760 hours.  CBG:  Recent Labs Lab 12/02/15 1809 12/06/15 0830  GLUCAP 90 140*    Addendum, addressing coding query  Severe malnutrition in context of social or environmental circumstances, Underweight - Mx per dietician consultation.   Signed:  Vernell Leep, MD, FACP, FHM. [facility] Pager [phone]  If 7PM-7AM, please contact night-coverage [email or web address] Password [facility] 12/07/2015, 12:58 PM

## 2015-12-07 NOTE — Progress Notes (Signed)
CSW continuing to follow.   CSW met with pt this morning as pt had bed at [facility] today.   Pt discussed that he was initially agreeable to going to ALF, but after thinking about it he states that he does not want to go to the ALF because it is in [place] and he wants to be able to eat and smoke whenever he wants and feels that he can manage his care back at the [facility]. Pt states that he catches a cab to go to the grocery store. CSW expressed concern about pt being able to care for himself and discussed that he had agreed with [facility] case worker, [facility]. Pt expressed that he knows he had said he would go, but now he wants to "revoke" what he said and go back to [place] staffed case with CSW director, [facility] and confirmed that there is no other choice rather than let pt return to [employer] as pt is medically ready and notify [facility] and APS.  CSW updated [facility]. CSW left message with [facility] to notify that pt declined ALF. CSW left message with APS caseworker, Devoria Albe.   CSW provided RN cab voucher approved by Engineer, agricultural, [employer]. RN will call for cab when pt ready to leave hospital.  No further social work needs identified at this time.  CSW signing off.   Alison Murray, MSW, LCSW Clinical Social Work Weekend coverage [phone]

## 2015-12-07 NOTE — Discharge Instructions (Signed)
Fecal Impaction A fecal impaction happens when there is a large, firm amount of stool (or feces) that cannot be passed. The impacted stool is usually in the rectum, which is the lowest part of the large bowel. The impacted stool can block the colon and cause significant problems. CAUSES  The longer stool stays in the rectum, the harder it gets. Anything that slows down your bowel movements can lead to fecal impaction, such as:  Constipation. This can be a long-standing (chronic) problem or can happen suddenly (acute).  Painful conditions of the rectum, such as hemorrhoids or anal fissures. The pain of these conditions can make you try to avoid having bowel movements.  Narcotic pain-relieving medicines, such as methadone, morphine, or codeine.  Not drinking enough fluids.  Inactivity and bed rest over long periods of time.  Diseases of the brain or nervous system that damage the nerves controlling the muscles of the intestines. SIGNS AND SYMPTOMS   Lack of normal bowel movements or changes in bowel patterns.  Sense of fullness in the rectum but unable to pass stool.  Pain or cramps in the abdominal area (often after meals).  Thin, watery discharge from the rectum. DIAGNOSIS  Your health care provider may suspect that you have a fecal impaction based on your symptoms and a physical exam. This will include an exam of your rectum. Sometimes X-rays or lab testing may be needed to confirm the diagnosis and to be sure there are no other problems.  TREATMENT   Initially an impaction can be removed manually. Using a gloved finger, your health care provider can remove hard stool from your rectum.  Medicine is sometimes needed. A suppository or enema can be given in the rectum to soften the stool, which can stimulate a bowel movement. Medicines can also be given by mouth (orally).  Though rare, surgery may be needed if the colon has torn (perforated) due to blockage. HOME CARE INSTRUCTIONS    Develop regular bowel habits. This could include getting in the habit of having a bowel movement after your morning cup of coffee or after eating. Be sure to allow yourself enough time on the toilet.  Maintain a high-fiber diet.  Drink enough fluids to keep your urine clear or pale yellow as directed by your health care provider.  Exercise regularly.  If you begin to get constipated, increase the amount of fiber in your diet. Eat plenty of fruits, vegetables, whole wheat breads, bran, oatmeal, and similar products.  Take natural fiber laxatives or other laxatives only as directed by your health care provider. SEEK MEDICAL CARE IF:   You have ongoing rectal pain.  You require enemas or suppositories more than twice a week.  You have rectal bleeding.  You have continued problems, or you develop abdominal pain.  You have thin, pencil-like stools. SEEK IMMEDIATE MEDICAL CARE IF:  You have black or tarry stools. MAKE SURE YOU:   Understand these instructions.  Will watch your condition.  Will get help right away if you are not doing well or get worse.   This information is not intended to replace advice given to you by your health care provider. Make sure you discuss any questions you have with your health care provider.   Document Released: 06/06/2004 Document Revised: 07/05/2013 Document Reviewed: 03/21/2013 Elsevier Interactive Patient Education 2016 [employer].   Rhabdomyolysis Rhabdomyolysis is a condition that results when muscle cells break down and release substances into the blood that can damage the kidneys. It  happens because of damage to the muscles that move bones (skeletal muscle). When you damage this type of muscle, substances inside of your muscle cells are released into your blood. This includes a certain protein called myoglobin. Your kidneys must filter myoglobin from your blood. Large amounts of myoglobin can cause kidney damage or kidney failure. Other  substances that are released by muscle cells may upset the balance of the minerals (electrolytes) in your blood. This makes your blood become too acidic (acidosis). CAUSES This condition is caused by muscle damage. Muscle damage often results from:  Extreme overuse of the muscles.  An injury that crushes or compresses a muscle.  Use of illegal drugs, especially cocaine.  Alcohol abuse. Other possible causes include:  Prescription medicines, such as statins, amphetamines, and opiates.  Infections.  Inherited muscle diseases.  High fever.  Heatstroke.  Dehydration.  Seizures.  Surgery. RISK FACTORS This condition is more likely to develop in:  People who have a family history of muscle disease.  People who participate in extreme sports, such as marathon running.  People who have diabetes.  Older people.  People who abuse drugs or alcohol. SYMPTOMS Symptoms of this condition vary. Some people have very few symptoms, while others have many symptoms. The most common symptoms include:  Muscle pain and swelling.  Muscle weakness.  Dark urine.  Feeling weak and tired. Other symptoms include:  Nausea and vomiting.  Fever.  Pain in the abdomen.  Joint pain. Signs and symptoms of complications from rhabdomyolysis may include:  Heart rhythm abnormalities (arrhythmias).  Seizures.  Reduced urine production because of kidney failure.  Very low blood pressure (shock).  Uncontrolled bleeding. DIAGNOSIS This condition may be diagnosed based on:  Your symptoms and medical history.  A physical exam.  Blood tests to check for:  Muscle breakdown products in the blood (creatine kinase).  Myoglobin.  Acidosis.  Electrolyte imbalances.  Urine tests to check for myoglobin. You may also have other tests to check for causes of muscle damage and to check for complications. TREATMENT Treatment for this condition focuses on keeping up your fluid level,  reversing acidosis, and protecting your kidneys. Treatment may include:  Fluids and medicines given through an IV tube that is inserted into one of your veins.  Medicines, such as:  Sodium bicarbonate to reduce acidosis.  Electrolytes to restore the balance of these minerals in your body.  Hemodialysis. This treatment uses an artificial kidney machine to filter your blood while you recover. You may have this if other treatments are not helping. HOME CARE INSTRUCTIONS  Take medicines only as directed by your health care provider.  Rest at home until your health care provider says that you can return to your normal activities.  Drink enough fluid to keep your urine clear or pale yellow.  Do not exercise with great energy and effort (strenuously). Ask your health care provider what level of exercise is safe for you.  Do not abuse drugs or alcohol. If you are struggling with drug or alcohol use, ask your health care provider for help.  Keep all follow-up visits as directed by your health care provider. This is important. SEEK MEDICAL CARE IF:  You develop symptoms of rhabdomyolysis at home after treatment. SEEK IMMEDIATE MEDICAL CARE IF:  You have a seizure.  You bleed easily or cannot control bleeding.  You cannot make urine.  You have chest pain.  You have trouble breathing.   This information is not intended to replace advice  given to you by your health care provider. Make sure you discuss any questions you have with your health care provider.   Document Released: 08/27/2004 Document Revised: 01/29/2015 Document Reviewed: 09/19/2014 Elsevier Interactive Patient Education [employer].

## 2015-12-08 LAB — CULTURE, BLOOD (ROUTINE X 2)
CULTURE: NO GROWTH
Culture: NO GROWTH

## 2015-12-10 ENCOUNTER — Emergency Department (HOSPITAL_COMMUNITY)
Admission: EM | Admit: 2015-12-10 | Discharge: 2015-12-10 | Disposition: A | Discharge status: Home / Self Care | Payer: Medicare Other | Attending: Emergency Medicine | Admitting: Emergency Medicine

## 2015-12-10 ENCOUNTER — Encounter (HOSPITAL_COMMUNITY): Payer: Self-pay

## 2015-12-10 DIAGNOSIS — Z79899 Other long term (current) drug therapy: Secondary | ICD-10-CM | POA: Insufficient documentation

## 2015-12-10 DIAGNOSIS — I509 Heart failure, unspecified: Secondary | ICD-10-CM | POA: Diagnosis not present

## 2015-12-10 DIAGNOSIS — F17210 Nicotine dependence, cigarettes, uncomplicated: Secondary | ICD-10-CM | POA: Diagnosis not present

## 2015-12-10 DIAGNOSIS — K625 Hemorrhage of anus and rectum: Secondary | ICD-10-CM | POA: Insufficient documentation

## 2015-12-10 DIAGNOSIS — Z9889 Other specified postprocedural states: Secondary | ICD-10-CM | POA: Insufficient documentation

## 2015-12-10 DIAGNOSIS — K644 Residual hemorrhoidal skin tags: Secondary | ICD-10-CM | POA: Insufficient documentation

## 2015-12-10 DIAGNOSIS — Z8639 Personal history of other endocrine, nutritional and metabolic disease: Secondary | ICD-10-CM | POA: Diagnosis not present

## 2015-12-10 DIAGNOSIS — Z7982 Long term (current) use of aspirin: Secondary | ICD-10-CM | POA: Diagnosis not present

## 2015-12-10 DIAGNOSIS — J449 Chronic obstructive pulmonary disease, unspecified: Secondary | ICD-10-CM | POA: Insufficient documentation

## 2015-12-10 DIAGNOSIS — Z8669 Personal history of other diseases of the nervous system and sense organs: Secondary | ICD-10-CM | POA: Diagnosis not present

## 2015-12-10 LAB — COMPREHENSIVE METABOLIC PANEL
ALT: 97 U/L — ABNORMAL HIGH (ref 17–63)
AST: 55 U/L — AB (ref 15–41)
Albumin: 3.9 g/dL (ref 3.5–5.0)
Alkaline Phosphatase: 54 U/L (ref 38–126)
Anion gap: 9 (ref 5–15)
BUN: 11 mg/dL (ref 6–20)
CHLORIDE: 107 mmol/L (ref 101–111)
CO2: 24 mmol/L (ref 22–32)
Calcium: 8.6 mg/dL — ABNORMAL LOW (ref 8.9–10.3)
Creatinine, Ser: 0.66 mg/dL (ref 0.61–1.24)
Glucose, Bld: 89 mg/dL (ref 65–99)
POTASSIUM: 3.7 mmol/L (ref 3.5–5.1)
SODIUM: 140 mmol/L (ref 135–145)
Total Bilirubin: 0.4 mg/dL (ref 0.3–1.2)
Total Protein: 6.8 g/dL (ref 6.5–8.1)

## 2015-12-10 LAB — CBC
HEMATOCRIT: 40 % (ref 39.0–52.0)
Hemoglobin: 12.9 g/dL — ABNORMAL LOW (ref 13.0–17.0)
MCH: 30 pg (ref 26.0–34.0)
MCHC: 32.3 g/dL (ref 30.0–36.0)
MCV: 93 fL (ref 78.0–100.0)
Platelets: 237 10*3/uL (ref 150–400)
RBC: 4.3 MIL/uL (ref 4.22–5.81)
RDW: 15.3 % (ref 11.5–15.5)
WBC: 7.8 10*3/uL (ref 4.0–10.5)

## 2015-12-10 LAB — TYPE AND SCREEN
ABO/RH(D): A POS
Antibody Screen: NEGATIVE

## 2015-12-10 LAB — POC OCCULT BLOOD, ED: FECAL OCCULT BLD: POSITIVE — AB

## 2015-12-10 NOTE — ED Notes (Signed)
Blood Bank Band [id number]

## 2015-12-10 NOTE — Discharge Instructions (Signed)
It was our pleasure to provide your ER care today - we hope that you feel better.  Avoid aspirin or anti-inflammatory medication use, such as ibuprofen/motrin or aleve.  No smoking.  Rest. Drink plenty of fluids.  Follow up with your primary care doctor in the next 1-2 days for recheck.   Also, given streaks of blood in stool, follow up with GI doctor in the coming week - see referral - call office tomorrow AM to arrange appointment.   Return to ER if worse, new symptoms, heavy or recurrent/persistent rectal bleeding, severe abdominal pain, vomiting of blood, weak/faint, fevers, other concern.   Gastrointestinal Bleeding Gastrointestinal (GI) bleeding means there is bleeding somewhere along the digestive tract, between the mouth and anus. CAUSES  There are many different problems that can cause GI bleeding. Possible causes include:  Esophagitis. This is inflammation, irritation, or swelling of the esophagus.  Hemorrhoids.These are veins that are full of blood (engorged) in the rectum. They cause pain, inflammation, and may bleed.  Anal fissures.These are areas of painful tearing which may bleed. They are often caused by passing hard stool.  Diverticulosis.These are pouches that form on the colon over time, with age, and may bleed significantly.  Diverticulitis.This is inflammation in areas with diverticulosis. It can cause pain, fever, and bloody stools, although bleeding is rare.  Polyps and cancer. Colon cancer often starts out as precancerous polyps.  Gastritis and ulcers.Bleeding from the upper gastrointestinal tract (near the stomach) may travel through the intestines and produce black, sometimes tarry, often bad smelling stools. In certain cases, if the bleeding is fast enough, the stools may not be black, but red. This condition may be life-threatening. SYMPTOMS   Vomiting bright red blood or material that looks like coffee grounds.  Bloody, black, or tarry  stools. DIAGNOSIS  Your caregiver may diagnose your condition by taking your history and performing a physical exam. More tests may be needed, including:  X-rays and other imaging tests.  Esophagogastroduodenoscopy (EGD). This test uses a flexible, lighted tube to look at your esophagus, stomach, and small intestine.  Colonoscopy. This test uses a flexible, lighted tube to look at your colon. TREATMENT  Treatment depends on the cause of your bleeding.   For bleeding from the esophagus, stomach, small intestine, or colon, the caregiver doing your EGD or colonoscopy may be able to stop the bleeding as part of the procedure.  Inflammation or infection of the colon can be treated with medicines.  Many rectal problems can be treated with creams, suppositories, or warm baths.  Surgery is sometimes needed.  Blood transfusions are sometimes needed if you have lost a lot of blood. If bleeding is slow, you may be allowed to go home. If there is a lot of bleeding, you will need to stay in the hospital for observation. HOME CARE INSTRUCTIONS   Take any medicines exactly as prescribed.  Keep your stools soft by eating foods that are high in fiber. These foods include whole grains, legumes, fruits, and vegetables. Prunes (1 to 3 a day) work well for many people.  Drink enough fluids to keep your urine clear or pale yellow. SEEK IMMEDIATE MEDICAL CARE IF:   Your bleeding increases.  You feel lightheaded, weak, or you faint.  You have severe cramps in your back or abdomen.  You pass large blood clots in your stool.  Your problems are getting worse. MAKE SURE YOU:   Understand these instructions.  Will watch your condition.  Will get  help right away if you are not doing well or get worse.   This information is not intended to replace advice given to you by your health care provider. Make sure you discuss any questions you have with your health care provider.   Document Released:  09/11/2000 Document Revised: 08/31/2012 Document Reviewed: 03/04/2015 Elsevier Interactive Patient Education [employer].

## 2015-12-10 NOTE — Progress Notes (Signed)
CSW was consulted by EDP to speak with patient regarding living situation.  CSW met with patient at bedside. Patient was alert and oriented. There was no family present. Patient confirms that he lives at the [facility]. Patient states that he has been living at [facility] for [facility] years. Patient states that he feels safe to return back to the motel, and states that he can take care of himself.  Patient was extremely guarded and hostile initially during the interview. Patient stated " We dont have nothing to discuss. Take a hike!  [place] me down as uncooperative".   CSW provided patient with community resources including homeless shelters and food pantries.   Patient states he has no questions for CSW at this time.  Willette Brace [phone] ED CSW 12/10/2015 5:23 PM

## 2015-12-10 NOTE — Progress Notes (Signed)
Pt with 7 ED visits and 3 admissions in the last 6 months  CM noted pt screaming out responses while ED SW attempting to assess him.  CM asked pt to be nice but he continued to be verbally aggressive with elevated tone - HOH  Pt states " I do what I want to"

## 2015-12-10 NOTE — ED Notes (Signed)
Per EMS- Patient states he had dark stools today and bright red blood streaked in the stool.  Patient also c/o constipation x 5 days. Patient denies any N/V/D

## 2015-12-10 NOTE — ED Provider Notes (Addendum)
CSN: [id number]     Arrival date & time 12/10/15  1405 History   First MD Initiated Contact with Patient 12/10/15 1527     Chief Complaint  Patient presents with  . GI Bleeding     (Consider location/radiation/quality/duration/timing/severity/associated sxs/prior Treatment) The history is provided by the patient and the EMS personnel. The history is limited by the condition of the patient.  Patient indicates noted bright red blood mixed w stool today - states he wiped after bm and noted blood on toilet paper. Small amount. Single episode.  No melena or other episodes of blood per rectum.  Pt very poor/limited historian - level 5 caveat.  Pt states some recent problems w constipation although did have bm today. Denies rectal injury or trauma. Pt unsure if hx hemorrhoids. Denies prior rectal bleeding. No abd pain. No vomiting. Pt unsure of wt loss.  Takes baby asa a day, denies other current nsaid use.  No faintness or dizziness.       Past Medical History  Diagnosis Date  . COPD (chronic obstructive pulmonary disease) ([place])   . HOH (hard of hearing)   . CHF (congestive heart failure) ([facility]) EF 20 - 25% in 2015  . HLD (hyperlipidemia)    Past Surgical History  Procedure Laterality Date  . Appendectomy    . Left heart catheterization with coronary angiogram N/A 11/07/2013    Procedure: LEFT HEART CATHETERIZATION WITH CORONARY ANGIOGRAM;  Surgeon: Clent Demark, MD;  Location: [facility] CATH LAB;  Service: Cardiovascular;  Laterality: N/A;  . Cardiac stents      No stent history   Family History  Problem Relation Age of Onset  . Stroke Mother    Social History  Substance Use Topics  . Smoking status: Current Every Day Smoker -- 0.50 packs/day    Types: Cigarettes  . Smokeless tobacco: Never Used  . Alcohol Use: No    Review of Systems  Constitutional: Negative for fever.  HENT: Negative for nosebleeds.   Eyes: Negative for redness.  Respiratory: Negative for shortness of breath.    Cardiovascular: Negative for chest pain.  Gastrointestinal: Negative for vomiting, abdominal pain and diarrhea.  Genitourinary: Negative for hematuria and flank pain.  Musculoskeletal: Negative for back pain and neck pain.  Skin: Negative for rash.  Neurological: Negative for headaches.  Hematological: Does not bruise/bleed easily.  Psychiatric/Behavioral: Negative for confusion.      Allergies  Review of patient's allergies indicates no known allergies.  Home Medications   Prior to Admission medications   Medication Sig Start Date End Date Taking? Authorizing Provider  albuterol (PROVENTIL HFA;VENTOLIN HFA) 108 (90 Base) MCG/ACT inhaler Inhale 2 puffs into the lungs every 6 (six) hours as needed for wheezing or shortness of breath.   Yes Historical Provider, MD  albuterol (PROVENTIL) (2.5 MG/3ML) 0.083% nebulizer solution Take 3 mLs (2.5 mg total) by nebulization every 6 (six) hours as needed for wheezing or shortness of breath. 12/07/15  Yes Modena Jansky, MD  aspirin 81 MG EC tablet Take 1 tablet (81 mg total) by mouth daily. 12/07/15  Yes Modena Jansky, MD  mometasone-formoterol (DULERA) 200-5 MCG/ACT AERO Inhale 1-2 puffs into the lungs as needed for wheezing.   Yes Historical Provider, MD  budesonide-formoterol (SYMBICORT) 160-4.5 MCG/ACT inhaler Inhale 2 puffs into the lungs 2 (two) times daily. Patient not taking: Reported on 12/10/2015 12/07/15   Modena Jansky, MD  Multiple Vitamin (MULTIVITAMIN WITH MINERALS) TABS tablet Take 1 tablet by mouth daily.  Patient not taking: Reported on 12/10/2015 12/07/15   Modena Jansky, MD  polyethylene glycol (MIRALAX / GLYCOLAX) packet Take 17 g by mouth daily. Patient not taking: Reported on 12/10/2015 12/07/15   Modena Jansky, MD  senna (SENOKOT) 8.6 MG TABS tablet Take 2 tablets (17.2 mg total) by mouth daily as needed for mild constipation or moderate constipation. Patient not taking: Reported on 12/10/2015 12/07/15   Modena Jansky, MD   BP 116/91 mmHg  Pulse 80  Temp(Src) 98.2 F (36.8 C) (Oral)  Resp 16  SpO2 96% Physical Exam  Constitutional: He appears well-developed and well-nourished. No distress.  HENT:  Mouth/Throat: Oropharynx is clear and moist.  Eyes: Conjunctivae are normal. No scleral icterus.  Neck: Neck supple. No tracheal deviation present.  Cardiovascular: Normal rate, regular rhythm, normal heart sounds and intact distal pulses.   Pulmonary/Chest: Effort normal and breath sounds normal. No accessory muscle usage. No respiratory distress.  Abdominal: Soft. Bowel sounds are normal. He exhibits no distension and no mass. There is no tenderness. There is no rebound and no guarding.  Genitourinary:  Light brown stool mixed w streak brb, tests hem positive.  External hemorrhoids, not acutely thrombosed or inflamed.   Musculoskeletal: Normal range of motion. He exhibits no edema.  Neurological: He is alert.  Skin: Skin is warm and dry. No rash noted. He is not diaphoretic.  Psychiatric: He has a normal mood and affect.  Nursing note and vitals reviewed.   ED Course  Procedures (including critical care time) Labs Review   Results for orders placed or performed during the hospital encounter of 12/10/15  Comprehensive metabolic panel  Result Value Ref Range   Sodium 140 135 - 145 mmol/L   Potassium 3.7 3.5 - 5.1 mmol/L   Chloride 107 101 - 111 mmol/L   CO2 24 22 - 32 mmol/L   Glucose, Bld 89 65 - 99 mg/dL   BUN 11 6 - 20 mg/dL   Creatinine, Ser 0.66 0.61 - 1.24 mg/dL   Calcium 8.6 (L) 8.9 - 10.3 mg/dL   Total Protein 6.8 6.5 - 8.1 g/dL   Albumin 3.9 3.5 - 5.0 g/dL   AST 55 (H) 15 - 41 U/L   ALT 97 (H) 17 - 63 U/L   Alkaline Phosphatase 54 38 - 126 U/L   Total Bilirubin 0.4 0.3 - 1.2 mg/dL   GFR calc non Af Amer >60 >60 mL/min   GFR calc Af Amer >60 >60 mL/min   Anion gap 9 5 - 15  CBC  Result Value Ref Range   WBC 7.8 4.0 - 10.5 K/uL   RBC 4.30 4.22 - 5.81 MIL/uL    Hemoglobin 12.9 (L) 13.0 - 17.0 g/dL   HCT 40.0 39.0 - 52.0 %   MCV 93.0 78.0 - 100.0 fL   MCH 30.0 26.0 - 34.0 pg   MCHC 32.3 30.0 - 36.0 g/dL   RDW 15.3 11.5 - 15.5 %   Platelets 237 150 - 400 K/uL  POC occult blood, ED  Result Value Ref Range   Fecal Occult Bld POSITIVE (A) NEGATIVE  Type and screen [place]  Result Value Ref Range   ABO/RH(D) A POS    Antibody Screen NEG    Sample Expiration 12/13/2015     Ct Abdomen Pelvis W Contrast  12/03/2015  CLINICAL DATA:  65 year old with a history of COPD and systolic CHF. Weakness. Abdominal pain. EXAM: CT ABDOMEN AND PELVIS WITH CONTRAST TECHNIQUE:  Multidetector CT imaging of the abdomen and pelvis was performed using the standard protocol following bolus administration of intravenous contrast. CONTRAST:  32mL OMNIPAQUE IOHEXOL 300 MG/ML SOLN, 155mL OMNIPAQUE IOHEXOL 300 MG/ML SOLN COMPARISON:  10/12/2015 FINDINGS: Lower chest: There is no pleural effusion identified. The lung bases are clear. Hepatobiliary: No masses or other significant abnormality. Gallbladder is not visualized and may be collapsed. No biliary dilatation noted. Pancreas: No mass, inflammatory changes, or other significant abnormality. Spleen: Within normal limits in size and appearance. Adrenals/Urinary Tract: Left adrenal mass is again identified. This measures 3.7 x 3.8 cm and is unchanged from previous exam compatible with benign adrenal adenoma. No kidney stones or obstructive uropathy identified. Left renal cyst is noted. The urinary bladder is within normal limits. Stomach/Bowel: No pathologic dilatation of the large or small bowel loops. There is moderate stool identified throughout the colon and rectum. The previously noted small bowel obstruction pattern has resolved in the interval. Vascular/lymph nodes: Calcified atherosclerotic disease involves the abdominal aorta. No aneurysm. No enlarged retroperitoneal or mesenteric adenopathy. No enlarged  pelvic or inguinal lymph nodes. Reproductive: Prostate gland is enlarged. Other: No free fluid or fluid collections identified. Musculoskeletal: No aggressive lytic or sclerotic bone lesions identified. IMPRESSION: 1. No acute findings no evidence for bowel obstruction. 2. Moderate stool burden identified within the colon. 3. Aortic atherosclerosis. Electronically Signed   By: Kerby Moors M.D.   On: 12/03/2015 17:05   US Abdomen Limited Ruq  12/05/2015  CLINICAL DATA:  Abnormal LFT EXAM: US ABDOMEN LIMITED - RIGHT UPPER QUADRANT COMPARISON:  CT 12/03/2015 FINDINGS: Gallbladder: No gallstones or wall thickening visualized. No sonographic Murphy sign noted by sonographer. Common bile duct: Diameter: 2.9 mm Liver: No focal lesion identified. Within normal limits in parenchymal echogenicity. IMPRESSION: Negative right upper quadrant ultrasound.  No gallstones. Electronically Signed   By: Franchot Gallo M.D.   On: 12/05/2015 21:21   I have personally reviewed and evaluated these images and lab results as part of my medical decision-making.    MDM   Labs.  Reviewed nursing notes and prior charts for additional history. Recent ct scan neg for acute process.  Pt mainly has light brown stool on exam, with small streaks brb.  hgb is stable as cmopared to prior. bp normal. No faintness or dizziness. Hr normal, c/w prior.    Discussed diff dx w pt including colon ca, avm, diverticula bleeding, fissure, int hemorrhoid, and need for gi follow up.   From recent d/c sw notes had recommend ALF, pt decline.  Pt indicates wants to return home/to his motel.  SW consulted in ED today to provide additional community resources.   Pt currently appears stable for d/c.   Return precautions provided.        Lajean Saver, MD 12/10/15 [phone]

## 2015-12-16 DIAGNOSIS — G4700 Insomnia, unspecified: Secondary | ICD-10-CM | POA: Diagnosis not present

## 2015-12-16 DIAGNOSIS — R634 Abnormal weight loss: Secondary | ICD-10-CM | POA: Diagnosis not present

## 2015-12-16 DIAGNOSIS — J441 Chronic obstructive pulmonary disease with (acute) exacerbation: Secondary | ICD-10-CM | POA: Diagnosis not present

## 2015-12-16 DIAGNOSIS — I10 Essential (primary) hypertension: Secondary | ICD-10-CM | POA: Diagnosis not present

## 2016-01-01 DIAGNOSIS — R634 Abnormal weight loss: Secondary | ICD-10-CM | POA: Diagnosis not present

## 2016-01-01 DIAGNOSIS — J441 Chronic obstructive pulmonary disease with (acute) exacerbation: Secondary | ICD-10-CM | POA: Diagnosis not present

## 2016-01-01 DIAGNOSIS — Z590 Homelessness: Secondary | ICD-10-CM | POA: Diagnosis not present

## 2016-01-01 DIAGNOSIS — I10 Essential (primary) hypertension: Secondary | ICD-10-CM | POA: Diagnosis not present

## 2016-01-07 ENCOUNTER — Emergency Department (HOSPITAL_COMMUNITY): Payer: Medicare Other

## 2016-01-07 ENCOUNTER — Emergency Department (HOSPITAL_COMMUNITY)
Admission: EM | Admit: 2016-01-07 | Discharge: 2016-01-08 | Disposition: A | Discharge status: Home / Self Care | Payer: Medicare Other | Attending: Emergency Medicine | Admitting: Emergency Medicine

## 2016-01-07 ENCOUNTER — Encounter (HOSPITAL_COMMUNITY): Payer: Self-pay | Admitting: Emergency Medicine

## 2016-01-07 DIAGNOSIS — E785 Hyperlipidemia, unspecified: Secondary | ICD-10-CM | POA: Diagnosis not present

## 2016-01-07 DIAGNOSIS — Z79899 Other long term (current) drug therapy: Secondary | ICD-10-CM | POA: Diagnosis not present

## 2016-01-07 DIAGNOSIS — F17210 Nicotine dependence, cigarettes, uncomplicated: Secondary | ICD-10-CM | POA: Diagnosis not present

## 2016-01-07 DIAGNOSIS — R0602 Shortness of breath: Secondary | ICD-10-CM | POA: Diagnosis not present

## 2016-01-07 DIAGNOSIS — Z7982 Long term (current) use of aspirin: Secondary | ICD-10-CM | POA: Insufficient documentation

## 2016-01-07 DIAGNOSIS — I509 Heart failure, unspecified: Secondary | ICD-10-CM | POA: Diagnosis not present

## 2016-01-07 DIAGNOSIS — R069 Unspecified abnormalities of breathing: Secondary | ICD-10-CM | POA: Diagnosis not present

## 2016-01-07 DIAGNOSIS — R0600 Dyspnea, unspecified: Secondary | ICD-10-CM | POA: Diagnosis not present

## 2016-01-07 DIAGNOSIS — J441 Chronic obstructive pulmonary disease with (acute) exacerbation: Secondary | ICD-10-CM | POA: Insufficient documentation

## 2016-01-07 LAB — CBC WITH DIFFERENTIAL/PLATELET
BASOS PCT: 0 %
Basophils Absolute: 0 10*3/uL (ref 0.0–0.1)
EOS ABS: 0.1 10*3/uL (ref 0.0–0.7)
Eosinophils Relative: 1 %
HEMATOCRIT: 37.9 % — AB (ref 39.0–52.0)
HEMOGLOBIN: 12 g/dL — AB (ref 13.0–17.0)
LYMPHS ABS: 0.5 10*3/uL — AB (ref 0.7–4.0)
LYMPHS PCT: 6 %
MCH: 29.5 pg (ref 26.0–34.0)
MCHC: 31.7 g/dL (ref 30.0–36.0)
MCV: 93.1 fL (ref 78.0–100.0)
MONOS PCT: 9 %
Monocytes Absolute: 0.8 10*3/uL (ref 0.1–1.0)
NEUTROS PCT: 84 %
Neutro Abs: 7.4 10*3/uL (ref 1.7–7.7)
Platelets: 150 10*3/uL (ref 150–400)
RBC: 4.07 MIL/uL — AB (ref 4.22–5.81)
RDW: 15.1 % (ref 11.5–15.5)
WBC: 8.8 10*3/uL (ref 4.0–10.5)

## 2016-01-07 LAB — COMPREHENSIVE METABOLIC PANEL
ALK PHOS: 30 U/L — AB (ref 38–126)
ALT: 15 U/L — AB (ref 17–63)
AST: 17 U/L (ref 15–41)
Albumin: 3.1 g/dL — ABNORMAL LOW (ref 3.5–5.0)
Anion gap: 9 (ref 5–15)
BILIRUBIN TOTAL: 0.4 mg/dL (ref 0.3–1.2)
BUN: 21 mg/dL — ABNORMAL HIGH (ref 6–20)
CALCIUM: 8.4 mg/dL — AB (ref 8.9–10.3)
CO2: 28 mmol/L (ref 22–32)
CREATININE: 1.06 mg/dL (ref 0.61–1.24)
Chloride: 103 mmol/L (ref 101–111)
Glucose, Bld: 108 mg/dL — ABNORMAL HIGH (ref 65–99)
Potassium: 4.1 mmol/L (ref 3.5–5.1)
SODIUM: 140 mmol/L (ref 135–145)
TOTAL PROTEIN: 5.1 g/dL — AB (ref 6.5–8.1)

## 2016-01-07 LAB — D-DIMER, QUANTITATIVE (NOT AT ARMC): D DIMER QUANT: 1.89 ug{FEU}/mL — AB (ref 0.00–0.50)

## 2016-01-07 LAB — LIPASE, BLOOD: Lipase: 25 U/L (ref 11–51)

## 2016-01-07 LAB — BRAIN NATRIURETIC PEPTIDE: B NATRIURETIC PEPTIDE 5: 32.2 pg/mL (ref 0.0–100.0)

## 2016-01-07 MED ORDER — IPRATROPIUM-ALBUTEROL 0.5-2.5 (3) MG/3ML IN SOLN
3.0000 mL | Freq: Once | RESPIRATORY_TRACT | Status: AC
  Administered 2016-01-07: 3 mL via RESPIRATORY_TRACT
  Filled 2016-01-07: qty 3

## 2016-01-07 MED ORDER — IPRATROPIUM BROMIDE 0.02 % IN SOLN: 0.5000 mg | Freq: Once | RESPIRATORY_TRACT | Status: DC

## 2016-01-07 MED ORDER — METHYLPREDNISOLONE SODIUM SUCC 125 MG IJ SOLR
125.0000 mg | Freq: Once | INTRAMUSCULAR | Status: AC
Start: 2016-01-07 — End: 2016-01-07
  Administered 2016-01-07: 125 mg via INTRAVENOUS
  Filled 2016-01-07: qty 2

## 2016-01-07 NOTE — ED Provider Notes (Signed)
CSN: [id number]     Arrival date & time 01/07/16  1900 History   First MD Initiated Contact with Patient 01/07/16 1915     Chief Complaint  Patient presents with  . Shortness of Breath     (Consider location/radiation/quality/duration/timing/severity/associated sxs/prior Treatment) HPI Anthony Bolton is a 65 y.o. male with PMH significant for COPD, CHF, and HLD who presents with 3 day history of gradual onset, constant, worsening shortness of breath. Patient states he ran out of his nebulizer medicine. He states he just finished prednisone yesterday, and has been taking amoxicillin for the past 4 days. Patient received 5 mg albuterol and a DuoNeb in route via EMS, and he reports he feels much better. Patient is ambulatory. He does not wear oxygen at home. Associated symptoms include dry cough. He denies chest pain, fever, dizziness, syncope, abdominal pain, nausea, vomiting, diarrhea. No history of DVT/PE.  Per chart review: Patient has had multiple ED visits for COPD exacerbations. He was last seen for this 11/20/2015.  Past Medical History  Diagnosis Date  . COPD (chronic obstructive pulmonary disease) ([place])   . HOH (hard of hearing)   . CHF (congestive heart failure) ([facility]) EF 20 - 25% in 2015  . HLD (hyperlipidemia)    Past Surgical History  Procedure Laterality Date  . Appendectomy    . Left heart catheterization with coronary angiogram N/A 11/07/2013    Procedure: LEFT HEART CATHETERIZATION WITH CORONARY ANGIOGRAM;  Surgeon: Clent Demark, MD;  Location: [facility] CATH LAB;  Service: Cardiovascular;  Laterality: N/A;  . Cardiac stents      No stent history   Family History  Problem Relation Age of Onset  . Stroke Mother    Social History  Substance Use Topics  . Smoking status: Current Every Day Smoker -- 0.50 packs/day    Types: Cigarettes  . Smokeless tobacco: Never Used  . Alcohol Use: No    Review of Systems All other systems negative unless otherwise stated in  HPI    Allergies  Review of patient's allergies indicates no known allergies.  Home Medications   Prior to Admission medications   Medication Sig Start Date End Date Taking? Authorizing Provider  albuterol (PROVENTIL HFA;VENTOLIN HFA) 108 (90 Base) MCG/ACT inhaler Inhale 2 puffs into the lungs every 6 (six) hours as needed for wheezing or shortness of breath. Reported on 01/07/2016   Yes Historical Provider, MD  albuterol (PROVENTIL) (2.5 MG/3ML) 0.083% nebulizer solution Take 3 mLs (2.5 mg total) by nebulization every 6 (six) hours as needed for wheezing or shortness of breath. 12/07/15  Yes Modena Jansky, MD  amoxicillin (AMOXIL) 500 MG capsule Take 1 capsule by mouth 3 (three) times daily. 12/31/15  Yes Historical Provider, MD  aspirin 81 MG EC tablet Take 1 tablet (81 mg total) by mouth daily. 12/07/15  Yes Modena Jansky, MD  budesonide-formoterol (SYMBICORT) 160-4.5 MCG/ACT inhaler Inhale 2 puffs into the lungs 2 (two) times daily. 12/07/15  Yes Modena Jansky, MD  mometasone-formoterol (DULERA) 200-5 MCG/ACT AERO Inhale 1-2 puffs into the lungs as needed for wheezing.   Yes Historical Provider, MD  Multiple Vitamin (MULTIVITAMIN WITH MINERALS) TABS tablet Take 1 tablet by mouth daily. 12/07/15  Yes Modena Jansky, MD  polyethylene glycol (MIRALAX / GLYCOLAX) packet Take 17 g by mouth daily. 12/07/15  Yes Modena Jansky, MD  senna (SENOKOT) 8.6 MG TABS tablet Take 2 tablets (17.2 mg total) by mouth daily as needed for mild constipation or  moderate constipation. 12/07/15  Yes Modena Jansky, MD  traZODone (DESYREL) 50 MG tablet Take 1 tablet by mouth as needed. sleep 12/16/15  Yes Historical Provider, MD   BP 127/93 mmHg  Pulse 75  Temp(Src) 98.4 F (36.9 C) (Oral)  Resp 17  SpO2 96% Physical Exam  Constitutional: He is oriented to person, place, and time. He appears well-developed and well-nourished.  Non-toxic appearance. He does not have a sickly appearance. He does not appear  ill.  Frail thin appearing male who appears older than stated age.   HENT:  Head: Normocephalic and atraumatic.  Mouth/Throat: Oropharynx is clear and moist.  Eyes: Conjunctivae are normal. Pupils are equal, round, and reactive to light.  Neck: Normal range of motion. Neck supple.  Cardiovascular: Normal rate, regular rhythm and normal heart sounds.   No murmur heard. Pulmonary/Chest: Effort normal and breath sounds normal. No accessory muscle usage or stridor. No respiratory distress. He has no wheezes. He has no rhonchi. He has no rales.  99% on RA.  Patient able to speak in full sentences.  Mild prolonged expirations.   Abdominal: Soft. Bowel sounds are normal. He exhibits no distension. There is tenderness (generalized). There is no rebound and no guarding.  Musculoskeletal: Normal range of motion.  Lymphadenopathy:    He has no cervical adenopathy.  Neurological: He is alert and oriented to person, place, and time.  Speech clear without dysarthria.  Skin: Skin is warm and dry.  Psychiatric: He has a normal mood and affect. His behavior is normal.    ED Course  Procedures (including critical care time) Labs Review Labs Reviewed  CBC WITH DIFFERENTIAL/PLATELET - Abnormal; Notable for the following:    RBC 4.07 (*)    Hemoglobin 12.0 (*)    HCT 37.9 (*)    Lymphs Abs 0.5 (*)    All other components within normal limits  COMPREHENSIVE METABOLIC PANEL - Abnormal; Notable for the following:    Glucose, Bld 108 (*)    BUN 21 (*)    Calcium 8.4 (*)    Total Protein 5.1 (*)    Albumin 3.1 (*)    ALT 15 (*)    Alkaline Phosphatase 30 (*)    All other components within normal limits  D-DIMER, QUANTITATIVE (NOT AT [facility]) - Abnormal; Notable for the following:    D-Dimer, Quant 1.89 (*)    All other components within normal limits  BRAIN NATRIURETIC PEPTIDE  LIPASE, BLOOD  I-STAT TROPOININ, ED    Imaging Review Dg Chest 2 View  01/07/2016  CLINICAL DATA:  Shortness of Breath  EXAM: CHEST  2 VIEW COMPARISON:  None. FINDINGS: Cardiac shadow is within normal limits. The lungs are hyperinflated consistent with COPD. No focal infiltrate or sizable effusion is seen. No acute bony abnormality is noted. IMPRESSION: COPD without acute abnormality. Electronically Signed   By: Inez Catalina M.D.   On: 01/07/2016 21:39   I have personally reviewed and evaluated these images and lab results as part of my medical decision-making.   EKG Interpretation   Date/Time:  Tuesday January 07 2016 19:11:46 EDT Ventricular Rate:  86 PR Interval:  128 QRS Duration: 82 QT Interval:  356 QTC Calculation: 426 R Axis:   54 Text Interpretation:  Sinus rhythm Probable left atrial enlargement  Anteroseptal infarct, age indeterminate Baseline wander in lead(s) V3 No  significant change since last tracing Confirmed by LIU MD, DANA [phone]) on  01/07/2016 11:55:29 PM      MDM  Final diagnoses:  Shortness of breath   Patient presents with gradual onset worsening shortness of breath x 3 days.  Ran out of his inhaler.  Denies CP, productive cough.  VSS, NAD.  On exam, heart RRR, lungs CTAB, abdomen soft and benign.  He does have a prolonged expiratory phase, but not wheezing.  Able to speak in full sentences without difficulty.  No evidence of hypoxia on RA, 96%.  He received 5 mg albuterol and duoneb via EMS and reported improvement.  He received IV solumedrol and another duoneb treatment in ED.  He is able to ambulate without hypoxia with oxygen saturation at 95%.  Labs without acute abnormalities, CXR negative, troponin 0.01.  EKG without acute changes.  Doubt ACS. However, given lack of wheezing with typical COPD exacerbation and after discussion of case with Dr. Oleta Mouse, d-dimer obtained and elevated at 1.89.  CTA chest pending at shift change.  Patient care hand off to Dr. Tamera Punt at shift change who will follow up on CTA and appropriate disposition.  If CTA negative, plan to discharge home with  prednisone and albuterol with PCP follow up.   Case has been discussed with Dr. Oleta Mouse who agrees with the above plan.       Gloriann Loan, PA-C 01/08/16 [id number]  Forde Dandy, MD 01/08/16 [phone]

## 2016-01-07 NOTE — ED Notes (Signed)
Per EMS, pt from home with c/o shortness of breath. Given 5 mg albuterol and a duoneb en route. BP-137/116, HR-86, 90% room air.

## 2016-01-07 NOTE — ED Notes (Signed)
Ambulated pt in hallway with pulse ox. O2% remained at 95% while ambulating.

## 2016-01-08 ENCOUNTER — Encounter (HOSPITAL_COMMUNITY): Payer: Self-pay | Admitting: Radiology

## 2016-01-08 ENCOUNTER — Emergency Department (HOSPITAL_COMMUNITY): Payer: Medicare Other

## 2016-01-08 DIAGNOSIS — J441 Chronic obstructive pulmonary disease with (acute) exacerbation: Secondary | ICD-10-CM | POA: Diagnosis not present

## 2016-01-08 DIAGNOSIS — R0600 Dyspnea, unspecified: Secondary | ICD-10-CM | POA: Diagnosis not present

## 2016-01-08 LAB — I-STAT TROPONIN, ED: TROPONIN I, POC: 0.01 ng/mL (ref 0.00–0.08)

## 2016-01-08 MED ORDER — ALBUTEROL SULFATE HFA 108 (90 BASE) MCG/ACT IN AERS: 2.0000 | INHALATION_SPRAY | RESPIRATORY_TRACT | Status: DC | PRN

## 2016-01-08 MED ORDER — PREDNISONE 20 MG PO TABS: 40.0000 mg | ORAL_TABLET | Freq: Every day | ORAL | Status: DC

## 2016-01-08 MED ORDER — IOPAMIDOL (ISOVUE-370) INJECTION 76%
INTRAVENOUS | Status: AC
  Administered 2016-01-08: 100 mL
  Filled 2016-01-08: qty 100

## 2016-01-08 NOTE — ED Notes (Signed)
CT notified that pt has appropriate IV

## 2016-01-08 NOTE — Discharge Instructions (Signed)
Chronic Obstructive Pulmonary Disease Chronic obstructive pulmonary disease (COPD) is a common lung condition in which airflow from the lungs is limited. COPD is a general term that can be used to describe many different lung problems that limit airflow, including both chronic bronchitis and emphysema. If you have COPD, your lung function will probably never return to normal, but there are measures you can take to improve lung function and make yourself feel better. CAUSES   Smoking (common).  Exposure to secondhand smoke.  Genetic problems.  Chronic inflammatory lung diseases or recurrent infections. SYMPTOMS  Shortness of breath, especially with physical activity.  Deep, persistent (chronic) cough with a large amount of thick mucus.  Wheezing.  Rapid breaths (tachypnea).  Gray or bluish discoloration (cyanosis) of the skin, especially in your fingers, toes, or lips.  Fatigue.  Weight loss.  Frequent infections or episodes when breathing symptoms become much worse (exacerbations).  Chest tightness. DIAGNOSIS Your health care provider will take a medical history and perform a physical examination to diagnose COPD. Additional tests for COPD may include:  Lung (pulmonary) function tests.  Chest X-ray.  CT scan.  Blood tests. TREATMENT  Treatment for COPD may include:  Inhaler and nebulizer medicines. These help manage the symptoms of COPD and make your breathing more comfortable.  Supplemental oxygen. Supplemental oxygen is only helpful if you have a low oxygen level in your blood.  Exercise and physical activity. These are beneficial for nearly all people with COPD.  Lung surgery or transplant.  Nutrition therapy to gain weight, if you are underweight.  Pulmonary rehabilitation. This may involve working with a team of health care providers and specialists, such as respiratory, occupational, and physical therapists. HOME CARE INSTRUCTIONS  Take all medicines  (inhaled or pills) as directed by your health care provider.  Avoid over-the-counter medicines or cough syrups that dry up your airway (such as antihistamines) and slow down the elimination of secretions unless instructed otherwise by your health care provider.  If you are a smoker, the most important thing that you can do is stop smoking. Continuing to smoke will cause further lung damage and breathing trouble. Ask your health care provider for help with quitting smoking. He or she can direct you to community resources or hospitals that provide support.  Avoid exposure to irritants such as smoke, chemicals, and fumes that aggravate your breathing.  Use oxygen therapy and pulmonary rehabilitation if directed by your health care provider. If you require home oxygen therapy, ask your health care provider whether you should purchase a pulse oximeter to measure your oxygen level at home.  Avoid contact with individuals who have a contagious illness.  Avoid extreme temperature and humidity changes.  Eat healthy foods. Eating smaller, more frequent meals and resting before meals may help you maintain your strength.  Stay active, but balance activity with periods of rest. Exercise and physical activity will help you maintain your ability to do things you want to do.  Preventing infection and hospitalization is very important when you have COPD. Make sure to receive all the vaccines your health care provider recommends, especially the pneumococcal and influenza vaccines. Ask your health care provider whether you need a pneumonia vaccine.  Learn and use relaxation techniques to manage stress.  Learn and use controlled breathing techniques as directed by your health care provider. Controlled breathing techniques include:  Pursed lip breathing. Start by breathing in (inhaling) through your nose for 1 second. Then, purse your lips as if you were   going to whistle and breathe out (exhale) through the  pursed lips for 2 seconds.  Diaphragmatic breathing. Start by putting one hand on your abdomen just above your waist. Inhale slowly through your nose. The hand on your abdomen should move out. Then purse your lips and exhale slowly. You should be able to feel the hand on your abdomen moving in as you exhale.  Learn and use controlled coughing to clear mucus from your lungs. Controlled coughing is a series of short, progressive coughs. The steps of controlled coughing are: 1. Lean your head slightly forward. 2. Breathe in deeply using diaphragmatic breathing. 3. Try to hold your breath for 3 seconds. 4. Keep your mouth slightly open while coughing twice. 5. Spit any mucus out into a tissue. 6. Rest and repeat the steps once or twice as needed. SEEK MEDICAL CARE IF:  You are coughing up more mucus than usual.  There is a change in the color or thickness of your mucus.  Your breathing is more labored than usual.  Your breathing is faster than usual. SEEK IMMEDIATE MEDICAL CARE IF:  You have shortness of breath while you are resting.  You have shortness of breath that prevents you from:  Being able to talk.  Performing your usual physical activities.  You have chest pain lasting longer than 5 minutes.  Your skin color is more cyanotic than usual.  You measure low oxygen saturations for longer than 5 minutes with a pulse oximeter. MAKE SURE YOU:  Understand these instructions.  Will watch your condition.  Will get help right away if you are not doing well or get worse.   This information is not intended to replace advice given to you by your health care provider. Make sure you discuss any questions you have with your health care provider.   Document Released: 06/24/2005 Document Revised: 10/05/2014 Document Reviewed: 05/11/2013 Elsevier Interactive Patient Education 2016 Elsevier Inc.  

## 2016-01-17 ENCOUNTER — Encounter (HOSPITAL_COMMUNITY): Payer: Self-pay

## 2016-01-17 ENCOUNTER — Emergency Department (HOSPITAL_COMMUNITY)
Admission: EM | Admit: 2016-01-17 | Discharge: 2016-01-17 | Disposition: A | Discharge status: Home / Self Care | Payer: Medicare Other | Attending: Emergency Medicine | Admitting: Emergency Medicine

## 2016-01-17 ENCOUNTER — Emergency Department (HOSPITAL_COMMUNITY): Payer: Medicare Other

## 2016-01-17 DIAGNOSIS — E785 Hyperlipidemia, unspecified: Secondary | ICD-10-CM | POA: Diagnosis not present

## 2016-01-17 DIAGNOSIS — Z7982 Long term (current) use of aspirin: Secondary | ICD-10-CM | POA: Insufficient documentation

## 2016-01-17 DIAGNOSIS — Z9049 Acquired absence of other specified parts of digestive tract: Secondary | ICD-10-CM | POA: Insufficient documentation

## 2016-01-17 DIAGNOSIS — Z9889 Other specified postprocedural states: Secondary | ICD-10-CM | POA: Diagnosis not present

## 2016-01-17 DIAGNOSIS — J439 Emphysema, unspecified: Secondary | ICD-10-CM | POA: Diagnosis not present

## 2016-01-17 DIAGNOSIS — H9202 Otalgia, left ear: Secondary | ICD-10-CM | POA: Insufficient documentation

## 2016-01-17 DIAGNOSIS — J449 Chronic obstructive pulmonary disease, unspecified: Secondary | ICD-10-CM | POA: Diagnosis not present

## 2016-01-17 DIAGNOSIS — G43809 Other migraine, not intractable, without status migrainosus: Secondary | ICD-10-CM

## 2016-01-17 DIAGNOSIS — F17210 Nicotine dependence, cigarettes, uncomplicated: Secondary | ICD-10-CM | POA: Diagnosis not present

## 2016-01-17 DIAGNOSIS — Z7952 Long term (current) use of systemic steroids: Secondary | ICD-10-CM | POA: Diagnosis not present

## 2016-01-17 DIAGNOSIS — I509 Heart failure, unspecified: Secondary | ICD-10-CM | POA: Diagnosis not present

## 2016-01-17 DIAGNOSIS — Z7951 Long term (current) use of inhaled steroids: Secondary | ICD-10-CM | POA: Diagnosis not present

## 2016-01-17 DIAGNOSIS — G43909 Migraine, unspecified, not intractable, without status migrainosus: Secondary | ICD-10-CM | POA: Diagnosis not present

## 2016-01-17 DIAGNOSIS — R51 Headache: Secondary | ICD-10-CM | POA: Diagnosis not present

## 2016-01-17 HISTORY — DX: Migraine, unspecified, not intractable, without status migrainosus: G43.909

## 2016-01-17 LAB — CBC WITH DIFFERENTIAL/PLATELET
Basophils Absolute: 0 10*3/uL (ref 0.0–0.1)
Basophils Relative: 0 %
Eosinophils Absolute: 0.2 10*3/uL (ref 0.0–0.7)
Eosinophils Relative: 4 %
HEMATOCRIT: 42.5 % (ref 39.0–52.0)
HEMOGLOBIN: 14.2 g/dL (ref 13.0–17.0)
LYMPHS PCT: 19 %
Lymphs Abs: 1.2 10*3/uL (ref 0.7–4.0)
MCH: 30.6 pg (ref 26.0–34.0)
MCHC: 33.4 g/dL (ref 30.0–36.0)
MCV: 91.6 fL (ref 78.0–100.0)
MONO ABS: 0.6 10*3/uL (ref 0.1–1.0)
MONOS PCT: 10 %
NEUTROS ABS: 4.2 10*3/uL (ref 1.7–7.7)
NEUTROS PCT: 67 %
Platelets: 137 10*3/uL — ABNORMAL LOW (ref 150–400)
RBC: 4.64 MIL/uL (ref 4.22–5.81)
RDW: 14.3 % (ref 11.5–15.5)
WBC: 6.2 10*3/uL (ref 4.0–10.5)

## 2016-01-17 LAB — BASIC METABOLIC PANEL
ANION GAP: 8 (ref 5–15)
BUN: 11 mg/dL (ref 6–20)
CALCIUM: 8.5 mg/dL — AB (ref 8.9–10.3)
CHLORIDE: 99 mmol/L — AB (ref 101–111)
CO2: 28 mmol/L (ref 22–32)
CREATININE: 0.63 mg/dL (ref 0.61–1.24)
GFR calc Af Amer: >60 mL/min (ref >60)
GFR calc non Af Amer: >60 mL/min (ref >60)
GLUCOSE: 90 mg/dL (ref 65–99)
Potassium: 4.2 mmol/L (ref 3.5–5.1)
Sodium: 135 mmol/L (ref 135–145)

## 2016-01-17 MED ORDER — ALBUTEROL SULFATE HFA 108 (90 BASE) MCG/ACT IN AERS
1.0000 | INHALATION_SPRAY | RESPIRATORY_TRACT | Status: DC | PRN
  Administered 2016-01-17: 2 via RESPIRATORY_TRACT
  Filled 2016-01-17: qty 6.7

## 2016-01-17 MED ORDER — BUDESONIDE-FORMOTEROL FUMARATE 160-4.5 MCG/ACT IN AERO: 2.0000 | INHALATION_SPRAY | Freq: Two times a day (BID) | RESPIRATORY_TRACT | Status: DC

## 2016-01-17 MED ORDER — ACETAMINOPHEN 325 MG PO TABS
650.0000 mg | ORAL_TABLET | Freq: Once | ORAL | Status: AC
  Administered 2016-01-17: 650 mg via ORAL
  Filled 2016-01-17: qty 2

## 2016-01-17 MED ORDER — IPRATROPIUM-ALBUTEROL 0.5-2.5 (3) MG/3ML IN SOLN
3.0000 mL | Freq: Once | RESPIRATORY_TRACT | Status: AC
  Administered 2016-01-17: 3 mL via RESPIRATORY_TRACT
  Filled 2016-01-17: qty 3

## 2016-01-17 NOTE — Discharge Instructions (Signed)
Chronic Obstructive Pulmonary Disease Exacerbation °Chronic obstructive pulmonary disease (COPD) is a common lung condition in which airflow from the lungs is limited. COPD is a general term that can be used to describe many different lung problems that limit airflow, including chronic bronchitis and emphysema. COPD exacerbations are episodes when breathing symptoms become much worse and require extra treatment. Without treatment, COPD exacerbations can be life threatening, and frequent COPD exacerbations can cause further damage to your lungs. °CAUSES °· Respiratory infections. °· Exposure to smoke. °· Exposure to air pollution, chemical fumes, or dust. °Sometimes there is no apparent cause or trigger. °RISK FACTORS °· Smoking cigarettes. °· Older age. °· Frequent prior COPD exacerbations. °SIGNS AND SYMPTOMS °· Increased coughing. °· Increased thick spit (sputum) production. °· Increased wheezing. °· Increased shortness of breath. °· Rapid breathing. °· Chest tightness. °DIAGNOSIS °Your medical history, a physical exam, and tests will help your health care provider make a diagnosis. Tests may include: °· A chest X-ray. °· Basic lab tests. °· Sputum testing. °· An arterial blood gas test. °TREATMENT °Depending on the severity of your COPD exacerbation, you may need to be admitted to a hospital for treatment. Some of the treatments commonly used to treat COPD exacerbations are:  °· Antibiotic medicines. °· Bronchodilators. These are drugs that expand the air passages. They may be given with an inhaler or nebulizer. Spacer devices may be needed to help improve drug delivery. °· Corticosteroid medicines. °· Supplemental oxygen therapy. °· Airway clearing techniques, such as noninvasive ventilation (NIV) and positive expiratory pressure (PEP). These provide respiratory support through a mask or other noninvasive device. °HOME CARE INSTRUCTIONS °· Do not smoke. Quitting smoking is very important to prevent COPD from  getting worse and exacerbations from happening as often. °· Avoid exposure to all substances that irritate the airway, especially to tobacco smoke. °· If you were prescribed an antibiotic medicine, finish it all even if you start to feel better. °· Take all medicines as directed by your health care provider. It is important to use correct technique with inhaled medicines. °· Drink enough fluids to keep your urine clear or pale yellow (unless you have a medical condition that requires fluid restriction). °· Use a cool mist vaporizer. This makes it easier to clear your chest when you cough. °· If you have a home nebulizer and oxygen, continue to use them as directed. °· Maintain all necessary vaccinations to prevent infections. °· Exercise regularly. °· Eat a healthy diet. °· Keep all follow-up appointments as directed by your health care provider. °SEEK IMMEDIATE MEDICAL CARE IF: °· You have worsening shortness of breath. °· You have trouble talking. °· You have severe chest pain. °· You have blood in your sputum. °· You have a fever. °· You have weakness, vomit repeatedly, or faint. °· You feel confused. °· You continue to get worse. °MAKE SURE YOU: °· Understand these instructions. °· Will watch your condition. °· Will get help right away if you are not doing well or get worse. °  °This information is not intended to replace advice given to you by your health care provider. Make sure you discuss any questions you have with your health care provider. °  °Document Released: 07/12/2007 Document Revised: 10/05/2014 Document Reviewed: 05/19/2013 °Elsevier Interactive Patient Education ©2016 [employer] ° °Migraine Headache °A migraine headache is an intense, throbbing pain on one or both sides of your head. A migraine can last for 30 minutes to several hours. °CAUSES  °The exact cause of   a migraine headache is not always known. However, a migraine may be caused when nerves in the brain become irritated and release  chemicals that cause inflammation. This causes pain. °Certain things may also trigger migraines, such as: °· Alcohol. °· Smoking. °· Stress. °· Menstruation. °· Aged cheeses. °· Foods or drinks that contain nitrates, glutamate, aspartame, or tyramine. °· Lack of sleep. °· Chocolate. °· Caffeine. °· Hunger. °· Physical exertion. °· Fatigue. °· Medicines used to treat chest pain (nitroglycerine), birth control pills, estrogen, and some blood pressure medicines. °SIGNS AND SYMPTOMS °· Pain on one or both sides of your head. °· Pulsating or throbbing pain. °· Severe pain that prevents daily activities. °· Pain that is aggravated by any physical activity. °· Nausea, vomiting, or both. °· Dizziness. °· Pain with exposure to bright lights, loud noises, or activity. °· General sensitivity to bright lights, loud noises, or smells. °Before you get a migraine, you may get warning signs that a migraine is coming (aura). An aura may include: °· Seeing flashing lights. °· Seeing bright spots, halos, or zigzag lines. °· Having tunnel vision or blurred vision. °· Having feelings of numbness or tingling. °· Having trouble talking. °· Having muscle weakness. °DIAGNOSIS  °A migraine headache is often diagnosed based on: °· Symptoms. °· Physical exam. °· A CT scan or MRI of your head. These imaging tests cannot diagnose migraines, but they can help rule out other causes of headaches. °TREATMENT °Medicines may be given for pain and nausea. Medicines can also be given to help prevent recurrent migraines.  °HOME CARE INSTRUCTIONS °· Only take over-the-counter or prescription medicines for pain or discomfort as directed by your health care provider. The use of long-term narcotics is not recommended. °· Lie down in a dark, quiet room when you have a migraine. °· Keep a journal to find out what may trigger your migraine headaches. For example, write down: °¨ What you eat and drink. °¨ How much sleep you get. °¨ Any change to your diet or  medicines. °· Limit alcohol consumption. °· Quit smoking if you smoke. °· Get 7-9 hours of sleep, or as recommended by your health care provider. °· Limit stress. °· Keep lights dim if bright lights bother you and make your migraines worse. °SEEK IMMEDIATE MEDICAL CARE IF:  °· Your migraine becomes severe. °· You have a fever. °· You have a stiff neck. °· You have vision loss. °· You have muscular weakness or loss of muscle control. °· You start losing your balance or have trouble walking. °· You feel faint or pass out. °· You have severe symptoms that are different from your first symptoms. °MAKE SURE YOU:  °· Understand these instructions. °· Will watch your condition. °· Will get help right away if you are not doing well or get worse. °  °This information is not intended to replace advice given to you by your health care provider. Make sure you discuss any questions you have with your health care provider. °  °Document Released: 09/14/2005 Document Revised: 10/05/2014 Document Reviewed: 05/22/2013 °Elsevier Interactive Patient Education ©2016 Elsevier Inc. ° °

## 2016-01-17 NOTE — Progress Notes (Addendum)
65 yr old medicare/medicaid [place] access who CM is familiar with  ED RN mentioned pt does not get $ for meds until the first of the month of May Cm discussed with pt how to contact pharmacy for credit of medications until get his check Pt states he is using [facility]  Pt denied church members or good support systems  Pt confirm he did call senior resources for [employer] wheels-pending response for services (but pt phone is d/c) and medicaid transportation Pt reports his cell phone not working CM checked it and it is disconnected Cm recommended [facility] to discuss his options until he get his check for May  Pt states he is paid up at hotel/home until Feb 08 2016 per pt Pt given crisis resources for [employer] that can assist with paying possible bills until he can get his "check"  ED RN updated _ pt asking for food as Cm left his room  Pt given copy of his medicaid # x 2 to assist with pharmacy interactions Cm watched as pt placed them in his wallet Cm had provided this the last time he was seen by CM

## 2016-01-17 NOTE — ED Notes (Signed)
Patient presents from home via ambulance.  He complains of migraine headaches once a month for several years.  The most recent migraine started 3 days ago.  Patient also complains of SOB related to his COPD and that he ran out of his Symbicort.  Patient also c/o bilateral ear pain.  Patient only has $2.50 in his bank account currently and states his SS check doesn't arrive until May 3rd.  Patient denies fever, N/V/D, cough, sore throat and nasal congestion.  On exam, patient is chronically ill appearing and exceptionally hard of hearing.  He appears agitated, but grooming is appropriate.  Because of his hearing difficulties, patient yells his responses and requires increased voice volume by examiner.  Patient has inspiratory wheezing throughout.  WOB is not increased.  S1/S2, +2 radial pulses bilaterally.  Abdomen soft, non-tender to palpation, +BS.  Strength and light touch sensation grossly intact.  PERRL 78mm, MAE.

## 2016-01-17 NOTE — ED Provider Notes (Signed)
CSN: [id number]     Arrival date & time 01/17/16  1054 History   First MD Initiated Contact with Patient 01/17/16 1109     Chief Complaint  Patient presents with  . Migraine  . Otalgia   HPI  Anthony Bolton is a 65 year old male with a past medical history of COPD, CHF, migraines and hearing impairment presenting with a headache and left ear pain. Onset of symptoms was 3 days ago. States that his head hurts all over but he is unable to describe the pain. He has not taken any medications for the pain at home. He states that this is similar to his migraine headaches in the past. He states that his left ear hurts and he has been putting cotton in it to alleviate his symptoms. He is unable to remember when the earache started. He denies ear drainage. He also notes that he is out of his Symbicort. He states that he has shortness of breath but this is unchanged today and he is requesting a refill. He states that he does not have enough money in his account to afford his medications and he does not get his Social Security check until May. He states that he is not concerned about his SOB today; he states this is his baseline but he needs a refill. Patient is extremely hard of hearing somewhat limiting interview and exam. Denies fevers, chills, vision changes, neck pain, chest pain, cough, abdominal pain, nausea, vomiting or diarrhea.  Past Medical History  Diagnosis Date  . COPD (chronic obstructive pulmonary disease) ([place])   . HOH (hard of hearing)   . CHF (congestive heart failure) (HCC) EF 20 - 25% in 2015  . HLD (hyperlipidemia)   . Migraines    Past Surgical History  Procedure Laterality Date  . Appendectomy    . Left heart catheterization with coronary angiogram N/A 11/07/2013    Procedure: LEFT HEART CATHETERIZATION WITH CORONARY ANGIOGRAM;  Surgeon: Clent Demark, MD;  Location: [facility] CATH LAB;  Service: Cardiovascular;  Laterality: N/A;  . Cardiac stents      No stent history   Family History   Problem Relation Age of Onset  . Stroke Mother    Social History  Substance Use Topics  . Smoking status: Current Every Day Smoker -- 0.50 packs/day    Types: Cigarettes  . Smokeless tobacco: Never Used  . Alcohol Use: No    Review of Systems  All other systems reviewed and are negative.     Allergies  Review of patient's allergies indicates no known allergies.  Home Medications   Prior to Admission medications   Medication Sig Start Date End Date Taking? Authorizing Provider  albuterol (PROVENTIL HFA;VENTOLIN HFA) 108 (90 Base) MCG/ACT inhaler Inhale 2 puffs into the lungs every 4 (four) hours as needed for wheezing or shortness of breath. 01/08/16  Yes Kayla Rose, PA-C  albuterol (PROVENTIL) (2.5 MG/3ML) 0.083% nebulizer solution Inhale 3 mLs into the lungs every 4 (four) hours as needed for wheezing.  12/31/15  Yes Historical Provider, MD  aspirin 81 MG EC tablet Take 1 tablet (81 mg total) by mouth daily. Patient taking differently: Take 162 mg by mouth daily. Take 162 mgs daily, may take an additional 162 mgs daily as needed for pain 12/07/15  Yes Modena Jansky, MD  budesonide-formoterol [facility]) 160-4.5 MCG/ACT inhaler Inhale 2 puffs into the lungs 2 (two) times daily. 12/07/15  Yes Modena Jansky, MD  traZODone (DESYREL) 50 MG tablet Take 1  tablet by mouth as needed. sleep 12/16/15  Yes Historical Provider, MD  budesonide-formoterol (SYMBICORT) 160-4.5 MCG/ACT inhaler Inhale 2 puffs into the lungs 2 (two) times daily. 01/17/16   Anthonette Lesage, PA-C  Multiple Vitamin (MULTIVITAMIN WITH MINERALS) TABS tablet Take 1 tablet by mouth daily. 12/07/15   Modena Jansky, MD  polyethylene glycol (MIRALAX / GLYCOLAX) packet Take 17 g by mouth daily. 12/07/15   Modena Jansky, MD  predniSONE (DELTASONE) 20 MG tablet Take 2 tablets (40 mg total) by mouth daily. Patient not taking: Reported on 01/17/2016 01/08/16   Gloriann Loan, PA-C  senna (SENOKOT) 8.6 MG TABS tablet Take 2 tablets  (17.2 mg total) by mouth daily as needed for mild constipation or moderate constipation. 12/07/15   Modena Jansky, MD   BP 133/104 mmHg  Pulse 80  Temp(Src) 97.9 F (36.6 C) (Oral)  Resp 19  SpO2 98% Physical Exam  Constitutional: He appears well-developed and well-nourished. No distress.  Chronically ill-appearing but in no acute distress. Extremely hard of hearing  HENT:  Head: Normocephalic and atraumatic.  Right Ear: External ear normal.  Left Ear: External ear normal.  Mouth/Throat: Oropharynx is clear and moist.  Eyes: Conjunctivae and EOM are normal. Pupils are equal, round, and reactive to light. Right eye exhibits no discharge. Left eye exhibits no discharge. No scleral icterus.  Neck: Normal range of motion. Neck supple.  No nuchal rigidity  Cardiovascular: Normal rate, regular rhythm and normal heart sounds.   Pulmonary/Chest: Effort normal. No respiratory distress. He has wheezes.  Abdominal: Soft. He exhibits no distension. There is no tenderness.  Musculoskeletal: Normal range of motion.  Moves all extremities spontaneously  Lymphadenopathy:    He has no cervical adenopathy.  Neurological: He is alert. Coordination normal.  Cranial nerves 3-12 tested and intact. Strength 5/5 and symmetric. Sensation to light touch intact throughout.   Skin: Skin is warm and dry. No rash noted.  Psychiatric: He has a normal mood and affect. His behavior is normal.  Nursing note and vitals reviewed.   ED Course  Procedures (including critical care time) Labs Review Labs Reviewed  CBC WITH DIFFERENTIAL/PLATELET - Abnormal; Notable for the following:    Platelets 137 (*)    All other components within normal limits  BASIC METABOLIC PANEL - Abnormal; Notable for the following:    Chloride 99 (*)    Calcium 8.5 (*)    All other components within normal limits    Imaging Review Dg Chest 2 View  01/17/2016  CLINICAL DATA:  Per EMS, Pt, from home/hotel, c/o L ear pain and  headache starting last night. SOB. Pain score 8/10. Hx of migraines. Pt reports that the headache started prior to the ear pain. Hx COPD. Hx CHF. Smoker. EXAM: CHEST  2 VIEW COMPARISON:  01/07/2016 FINDINGS: Lungs are hyperinflated. There are extensive emphysematous changes. No focal consolidations or pleural effusions. No pulmonary edema. Heart size is normal. IMPRESSION: Extensive emphysema. No evidence for acute  abnormality. Electronically Signed   By: Nolon Nations M.D.   On: 01/17/2016 13:07   I have personally reviewed and evaluated these images and lab results as part of my medical decision-making.   EKG Interpretation None     2:15 - Re-evaluated pt. Improvement in breath sounds after duoneb with improved SOB. Pt is requesting lunch. Still has not received tylenol.   MDM   Final diagnoses:  Other type of nonintractable migraine  Chronic obstructive pulmonary disease, unspecified COPD type ([place])  65 year old male presenting with migraine and left earache. Patient reports this headache is similar to past migraines. No associated symptoms. Afebrile and hemodynamically stable. Nonfocal neuro exam. No meningeal signs. TMs pearly gray with good visualization of landmarks bilaterally. Lungs with coarse breath sounds and scant wheezing in all fields. Headache treated with Tylenol with improvement in pain. DuoNeb given which improved patient's shortness of breath. No headache red flags including fever, meningeal signs, thunderclap headache, seizure-like activity, vision changes, neuro deficits, change in headache pattern, anticoagulant use or altered mental state. No imaging indicated at this time. Chart review shows the patient is here frequently for refills of his COPD inhalers. He states that he is at his baseline breathing; I do not believe this is an exacerbation. Chest x-ray without acute abnormality. Given albuterol inhaler in emergency department and prescription for his Symbicort. Long  discussion with patient the importance of PCP follow-up so that he does not continue running out of his inhalers. Patient states understanding. Return precautions given in discharge paperwork and discussed with pt at bedside. Pt stable for discharge     Josephina Gip, PA-C 01/17/16 [place], MD 01/17/16 1735

## 2016-01-17 NOTE — Progress Notes (Signed)
CSW was notified by registration that patient has been sitting in lobby for 3 hours. CSW Spoke with patient who confirms that he does not have transportation home. CSW provided patient with taxi voucher.  Willette Brace [id number] ED CSW 01/17/2016 8:15 PM

## 2016-01-17 NOTE — ED Notes (Signed)
Per EMS,  Pt, from home/hotel, c/o L ear pain and headache starting last night.  Pain score 8/10.  Hx of migraines.  Pt reports that the headache started prior to the ear pain.

## 2016-02-12 DIAGNOSIS — I10 Essential (primary) hypertension: Secondary | ICD-10-CM | POA: Diagnosis not present

## 2016-02-12 DIAGNOSIS — R634 Abnormal weight loss: Secondary | ICD-10-CM | POA: Diagnosis not present

## 2016-02-12 DIAGNOSIS — Z681 Body mass index (BMI) 19 or less, adult: Secondary | ICD-10-CM | POA: Diagnosis not present

## 2016-04-06 ENCOUNTER — Emergency Department (HOSPITAL_COMMUNITY)
Admission: EM | Admit: 2016-04-06 | Discharge: 2016-04-06 | Disposition: A | Discharge status: Home / Self Care | Payer: Medicare Other | Attending: Emergency Medicine | Admitting: Emergency Medicine

## 2016-04-06 ENCOUNTER — Encounter (HOSPITAL_COMMUNITY): Payer: Self-pay | Admitting: Emergency Medicine

## 2016-04-06 ENCOUNTER — Emergency Department (HOSPITAL_COMMUNITY): Payer: Medicare Other

## 2016-04-06 DIAGNOSIS — E785 Hyperlipidemia, unspecified: Secondary | ICD-10-CM | POA: Diagnosis not present

## 2016-04-06 DIAGNOSIS — R6883 Chills (without fever): Secondary | ICD-10-CM | POA: Diagnosis not present

## 2016-04-06 DIAGNOSIS — F17210 Nicotine dependence, cigarettes, uncomplicated: Secondary | ICD-10-CM | POA: Diagnosis not present

## 2016-04-06 DIAGNOSIS — R110 Nausea: Secondary | ICD-10-CM | POA: Diagnosis not present

## 2016-04-06 DIAGNOSIS — J449 Chronic obstructive pulmonary disease, unspecified: Secondary | ICD-10-CM | POA: Diagnosis not present

## 2016-04-06 DIAGNOSIS — I509 Heart failure, unspecified: Secondary | ICD-10-CM | POA: Diagnosis not present

## 2016-04-06 DIAGNOSIS — Z7982 Long term (current) use of aspirin: Secondary | ICD-10-CM | POA: Insufficient documentation

## 2016-04-06 DIAGNOSIS — R509 Fever, unspecified: Secondary | ICD-10-CM | POA: Diagnosis not present

## 2016-04-06 DIAGNOSIS — J439 Emphysema, unspecified: Secondary | ICD-10-CM | POA: Diagnosis not present

## 2016-04-06 LAB — URINALYSIS, ROUTINE W REFLEX MICROSCOPIC
Bilirubin Urine: NEGATIVE
Glucose, UA: NEGATIVE mg/dL
HGB URINE DIPSTICK: NEGATIVE
KETONES UR: NEGATIVE mg/dL
LEUKOCYTES UA: NEGATIVE
Nitrite: NEGATIVE
PROTEIN: NEGATIVE mg/dL
Specific Gravity, Urine: 1.014 (ref 1.005–1.030)
pH: 7.5 (ref 5.0–8.0)

## 2016-04-06 LAB — COMPREHENSIVE METABOLIC PANEL
ALT: 17 U/L (ref 17–63)
ANION GAP: 8 (ref 5–15)
AST: 16 U/L (ref 15–41)
Albumin: 4.5 g/dL (ref 3.5–5.0)
Alkaline Phosphatase: 52 U/L (ref 38–126)
BILIRUBIN TOTAL: 0.8 mg/dL (ref 0.3–1.2)
BUN: 9 mg/dL (ref 6–20)
CALCIUM: 9.4 mg/dL (ref 8.9–10.3)
CO2: 28 mmol/L (ref 22–32)
Chloride: 92 mmol/L — ABNORMAL LOW (ref 101–111)
Creatinine, Ser: 0.6 mg/dL — ABNORMAL LOW (ref 0.61–1.24)
GFR calc Af Amer: >60 mL/min (ref >60)
GFR calc non Af Amer: >60 mL/min (ref >60)
Glucose, Bld: 98 mg/dL (ref 65–99)
POTASSIUM: 4.3 mmol/L (ref 3.5–5.1)
Sodium: 128 mmol/L — ABNORMAL LOW (ref 135–145)
TOTAL PROTEIN: 6.9 g/dL (ref 6.5–8.1)

## 2016-04-06 LAB — I-STAT TROPONIN, ED: TROPONIN I, POC: 0.01 ng/mL (ref 0.00–0.08)

## 2016-04-06 LAB — CBC WITH DIFFERENTIAL/PLATELET
Basophils Absolute: 0 10*3/uL (ref 0.0–0.1)
Basophils Relative: 0 %
Eosinophils Absolute: 0.2 10*3/uL (ref 0.0–0.7)
Eosinophils Relative: 3 %
HEMATOCRIT: 40.5 % (ref 39.0–52.0)
Hemoglobin: 14.4 g/dL (ref 13.0–17.0)
LYMPHS ABS: 1.1 10*3/uL (ref 0.7–4.0)
LYMPHS PCT: 16 %
MCH: 31 pg (ref 26.0–34.0)
MCHC: 35.6 g/dL (ref 30.0–36.0)
MCV: 87.3 fL (ref 78.0–100.0)
MONO ABS: 0.5 10*3/uL (ref 0.1–1.0)
MONOS PCT: 8 %
NEUTROS ABS: 5.1 10*3/uL (ref 1.7–7.7)
Neutrophils Relative %: 73 %
Platelets: 178 10*3/uL (ref 150–400)
RBC: 4.64 MIL/uL (ref 4.22–5.81)
RDW: 14 % (ref 11.5–15.5)
WBC: 7 10*3/uL (ref 4.0–10.5)

## 2016-04-06 MED ORDER — ACETAMINOPHEN 325 MG PO TABS
650.0000 mg | ORAL_TABLET | Freq: Once | ORAL | Status: AC
  Administered 2016-04-06: 650 mg via ORAL
  Filled 2016-04-06: qty 2

## 2016-04-06 MED ORDER — SODIUM CHLORIDE 0.9 % IV BOLUS (SEPSIS): 500.0000 mL | Freq: Once | INTRAVENOUS | Status: DC

## 2016-04-06 NOTE — ED Notes (Signed)
Patient trying to give sample at this time.

## 2016-04-06 NOTE — ED Notes (Signed)
Per EMS pt complaint of fever and chills for 2 days; believes he has the flu. Active bed bugs and cockroaches visualized by EMS staff.

## 2016-04-06 NOTE — Progress Notes (Addendum)
Entered in d/c instructions Anthony Bolton On 04/08/2016 You have been scheduled an appointment for follow up care on Wednesday 04/08/16 at 2 pm [address] [place] [address] [phone]  [facility]  A referral has been made to [facility] will receive a call from a staff member for services  [place]. Rosser Parkman [phone]

## 2016-04-06 NOTE — ED Notes (Signed)
Aware we need urine- urinal at bedside

## 2016-04-06 NOTE — Progress Notes (Signed)
Cm Spoke with Renee wilson [phone] to discuss pt for [facility] referral Pt is not meeting referral criteria related to having a teachable caregiver to work with pt when [facility] is closed during the weekends

## 2016-04-06 NOTE — Progress Notes (Addendum)
ED CM noted ED Consult for EDP concern about pt living alone Presented to [facility] ED today with c/o fever and noticeable bugs plus requesting food ED CM Reviewed EDP note and spoke with EDP, Alfonse Spruce about this pt  ED CM reviewed EPIC chart review This Cm familiar with this pt This Cm has assisted this pt with lots of community resources Refer to [facility] April 2017 note This pt has been offered SW services for ALF and has refused ED CM spoke ED SW, L Newsome about this pt Inquired if possible resources for living arrangements for pt  ED CM Met with pt He has bug precautions posted Cm followed appropriate precautions CM spoke with pt about CM consult and EDP concerns CM noted Pt HOH Hears better on right side when he if facing you to read lips.  Cm confirms pt continues to use Meals on wheels, pantries, Medicaid transportation, cabs, last seen pcp a month ago, continues not to have a primary caregiver, is not home bound and preference to remain at [facility] When Cm asked pt if Cm could have ED SW speak with him about another place to stay " I have changed addresses too many times.  It is the cheapest place in town" When Cm asked if Cm would be wasting time contacting the SW the pt changed the subject to his hearing concerns Pt has confirmed he has received an audiologist evaluation and needs to go to an audiology interview prior to getting hearing aids Reports having a DSS Case worker "Ms Berdine Addison who comes to talk to me. Was suppose to help me with SCAT to my doctor appointments. Her number is [phone]."  Pt found this number in his [employer] pcs cell phone at the bedside Pt has reports seeing his pcp "in the last few months", having pharmacy deliver medications and meals on wheels deliver food Pt has his room key and albuterol neb in his jean pocket Pt socks are yellow.  Pt jeans are soiled.  Pt with hospital gown on.  No odor from pt noted. No bugs noted during assessment Noted eggs on pt chest and empty food containers on pt  bedside table  CM left a message at [phone] Pending a return call to Cm mobile number EDP and ED SW updated- Pt preference is to remain at present facility  Pt with [facility] EDE visits x 7 and admissions x 2 in the past 6 months Pt complains of "restless leg and cramps" that he has not discussed with pcp per pt CM encouraged him to discuss with pcp and EDP and discussed with him that there is tx  CM completed [facility] referral for this pt (65 y.o medicare, [place] access pt with pcp listed as veita bland 2 hospital visits, 7 Ed visits in last 6 months living at [facility] PMH COPD, CHF, HOH, Migraines, No good support system, [place] resident seen in [facility] ED 04/06/16)

## 2016-04-06 NOTE — ED Notes (Signed)
Bed: [id number] Expected date: 04/06/16 Expected time: 8:00 AM Means of arrival: Ambulance Comments: EMS 65 yo Fever

## 2016-04-06 NOTE — ED Notes (Signed)
Patient has tried to give sample. Patient unsuccessful. EDP and RN aware

## 2016-04-06 NOTE — ED Notes (Signed)
Lights dimmed for pt comfort
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
## 2016-04-06 NOTE — ED Notes (Addendum)
Pt states he has also had leg cramping and a headache onset of this morning.

## 2016-04-06 NOTE — ED Provider Notes (Signed)
CSN: [id number]     Arrival date & time 04/06/16  0806 History   First MD Initiated Contact with Patient 04/06/16 [phone]     Chief Complaint  Patient presents with  . Chills  . Bed Bugs      (Consider location/radiation/quality/duration/timing/severity/associated sxs/prior Treatment) HPI Comments: 65 year old male with history of COPD, CHF, hyperlipidemia, hard of hearing presents for what he reports is a stomach flu. He says over the last 2 days he has felt sick on his stomach. He reports he has not eaten anything since yesterday. The patient is repetitively asking for food and coffee and something to drink. He says that he does not have a stove at home and this has a microwave. He reports that he does have family but has not seen them in 52 years. He said that he has sons and that he tried to find one of them in [place] recently but was unable to even when he checked at the post office there. He also states that one of his sons is currently in jail. He said he is hoping he can get out of jail so he can help him at home. The patient also reports that over the last 2 days he's been feeling cold even when it's 80 and his home. He does have senior meals delivered to him usually once a day. He does report that he feels he needs help at home but does not have any currently. He denies cough, shortness of breath, diarrhea, urinary changes, chest pain.   Past Medical History  Diagnosis Date  . COPD (chronic obstructive pulmonary disease) ([place])   . HOH (hard of hearing)   . CHF (congestive heart failure) ([facility]) EF 20 - 25% in 2015  . HLD (hyperlipidemia)   . Migraines    Past Surgical History  Procedure Laterality Date  . Appendectomy    . Left heart catheterization with coronary angiogram N/A 11/07/2013    Procedure: LEFT HEART CATHETERIZATION WITH CORONARY ANGIOGRAM;  Surgeon: Clent Demark, MD;  Location: [facility] CATH LAB;  Service: Cardiovascular;  Laterality: N/A;  . Cardiac stents      No stent  history   Family History  Problem Relation Age of Onset  . Stroke Mother    Social History  Substance Use Topics  . Smoking status: Current Every Day Smoker -- 0.50 packs/day    Types: Cigarettes  . Smokeless tobacco: Never Used  . Alcohol Use: No    Review of Systems  Constitutional: Positive for chills. Negative for fever and fatigue.  HENT: Negative for congestion, postnasal drip, rhinorrhea and sinus pressure.   Respiratory: Negative for cough, chest tightness and shortness of breath.   Cardiovascular: Negative for chest pain, palpitations and leg swelling.  Gastrointestinal: Positive for nausea. Negative for vomiting, abdominal pain and diarrhea.  Genitourinary: Negative for dysuria, urgency and hematuria.  Musculoskeletal: Negative for myalgias and back pain.  Skin: Negative for rash.  Neurological: Negative for dizziness, weakness, light-headedness and numbness.  Hematological: Does not bruise/bleed easily.      Allergies  Review of patient's allergies indicates no known allergies.  Home Medications   Prior to Admission medications   Medication Sig Start Date End Date Taking? Authorizing Provider  albuterol (PROVENTIL HFA;VENTOLIN HFA) 108 (90 Base) MCG/ACT inhaler Inhale 2 puffs into the lungs every 4 (four) hours as needed for wheezing or shortness of breath. 01/08/16   Gloriann Loan, PA-C  albuterol (PROVENTIL) (2.5 MG/3ML) 0.083% nebulizer solution Inhale 3 mLs into  the lungs every 4 (four) hours as needed for wheezing.  12/31/15   Historical Provider, MD  aspirin 81 MG EC tablet Take 1 tablet (81 mg total) by mouth daily. Patient taking differently: Take 162 mg by mouth daily. Take 162 mgs daily, may take an additional 162 mgs daily as needed for pain 12/07/15   Modena Jansky, MD  budesonide-formoterol [facility]) 160-4.5 MCG/ACT inhaler Inhale 2 puffs into the lungs 2 (two) times daily. 12/07/15   Modena Jansky, MD  budesonide-formoterol (SYMBICORT) 160-4.5 MCG/ACT  inhaler Inhale 2 puffs into the lungs 2 (two) times daily. 01/17/16   Stevi Barrett, PA-C  Multiple Vitamin (MULTIVITAMIN WITH MINERALS) TABS tablet Take 1 tablet by mouth daily. 12/07/15   Modena Jansky, MD  polyethylene glycol (MIRALAX / GLYCOLAX) packet Take 17 g by mouth daily. 12/07/15   Modena Jansky, MD  predniSONE (DELTASONE) 20 MG tablet Take 2 tablets (40 mg total) by mouth daily. Patient not taking: Reported on 01/17/2016 01/08/16   Gloriann Loan, PA-C  senna (SENOKOT) 8.6 MG TABS tablet Take 2 tablets (17.2 mg total) by mouth daily as needed for mild constipation or moderate constipation. 12/07/15   Modena Jansky, MD  traZODone (DESYREL) 50 MG tablet Take 1 tablet by mouth as needed. sleep 12/16/15   Historical Provider, MD   BP 128/98 mmHg  Pulse 76  Temp(Src) 98 F (36.7 C) (Oral)  Resp 16  SpO2 99% Physical Exam  Constitutional: He is oriented to person, place, and time. He appears well-developed and well-nourished. No distress.  HENT:  Head: Normocephalic and atraumatic.  Right Ear: External ear normal.  Left Ear: External ear normal.  Mouth/Throat: Oropharynx is clear and moist. No oropharyngeal exudate.  Eyes: EOM are normal. Pupils are equal, round, and reactive to light.  Neck: Normal range of motion. Neck supple.  Cardiovascular: Normal rate, regular rhythm and intact distal pulses.   Pulmonary/Chest: Effort normal. No respiratory distress. He has no wheezes. He has no rales.  Abdominal: Soft. He exhibits no distension. There is no tenderness.  Musculoskeletal: He exhibits no edema.  Neurological: He is alert and oriented to person, place, and time. No cranial nerve deficit. He exhibits normal muscle tone.  Skin: Skin is warm and dry. No rash noted. He is not diaphoretic.  Vitals reviewed.   ED Course  Procedures (including critical care time) Labs Review Labs Reviewed  COMPREHENSIVE METABOLIC PANEL - Abnormal; Notable for the following:    Sodium 128 (*)     Chloride 92 (*)    Creatinine, Ser 0.60 (*)    All other components within normal limits  CBC WITH DIFFERENTIAL/PLATELET  URINALYSIS, ROUTINE W REFLEX MICROSCOPIC (NOT AT [facility])  Randolm Idol, ED    Imaging Review Dg Abd Acute W/chest  04/06/2016  CLINICAL DATA:  Fever and chills.  Mid abdominal pain. EXAM: DG ABDOMEN ACUTE W/ 1V CHEST COMPARISON:  01/17/2016 FINDINGS: There is no evidence of dilated bowel loops or free intraperitoneal air. Extensive stool in the colon. No visible fecal impaction. The lungs are hyperinflated with flattening of the diaphragm consistent with emphysema. Heart size and pulmonary vascularity are normal. No acute bone abnormality. 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 IMPRESSION: 1. Emphysema. 2. Extensive stool in the colon without bowel dilatation. Electronically Signed   By: Lorriane Shire M.D.   On: 04/06/2016 09:32   I have personally reviewed and evaluated these images and lab results as part of my medical decision-making.  EKG Interpretation   Date/Time:  Monday April 06 2016 08:23:17 EDT Ventricular Rate:  70 PR Interval:    QRS Duration: 93 QT Interval:  420 QTC Calculation: [id number] R Axis:   -34 Text Interpretation:  Sinus rhythm Left axis deviation No significant  change since last tracing Confirmed by NGUYEN, EMILY ([id number]) on 04/06/2016  9:15:34 AM      MDM  Patient was seen and evaluated in stable condition.  Benign examination. No acute process found on x-ray. UA not consistent with infection. Vital stable and patient afebrile. Patient with mild hyponatremia but otherwise normal laboratory studies. Patient appeared well and ate several meals while in the emergency department. Care management came by and again offered the patient help for possible placement but he refused this. Patient was discharged home in stable condition. Final diagnoses:  Chills    1. Chills    Harvel Quale, MD 04/06/16 [phone]

## 2016-04-06 NOTE — ED Notes (Signed)
Pt tried again for UA for about 10 minutes.  Pt wanting Asprin

## 2016-04-06 NOTE — Discharge Instructions (Signed)
You were seen and evaluated for your chills and feeling like he had a fever. He did not have a fever here. Your laboratory results chest x-ray were unremarkable. Please follow-up outpatient. Please use the resources that were provided to you through case management.  Fatigue Fatigue is feeling tired all of the time, a lack of energy, or a lack of motivation. Occasional or mild fatigue is often a normal response to activity or life in general. However, long-lasting (chronic) or extreme fatigue may indicate an underlying medical condition. HOME CARE INSTRUCTIONS  Watch your fatigue for any changes. The following actions may help to lessen any discomfort you are feeling:  Talk to your health care provider about how much sleep you need each night. Try to get the required amount every night.  Take medicines only as directed by your health care provider.  Eat a healthy and nutritious diet. Ask your health care provider if you need help changing your diet.  Drink enough fluid to keep your urine clear or pale yellow.  Practice ways of relaxing, such as yoga, meditation, massage therapy, or acupuncture.  Exercise regularly.   Change situations that cause you stress. Try to keep your work and personal routine reasonable.  Do not abuse illegal drugs.  Limit alcohol intake to no more than 1 drink per day for nonpregnant women and 2 drinks per day for men. One drink equals 12 ounces of beer, 5 ounces of wine, or 1 ounces of hard liquor.  Take a multivitamin, if directed by your health care provider. SEEK MEDICAL CARE IF:   Your fatigue does not get better.  You have a fever.   You have unintentional weight loss or gain.  You have headaches.   You have difficulty:   Falling asleep.  Sleeping throughout the night.  You feel angry, guilty, anxious, or sad.   You are unable to have a bowel movement (constipation).   You skin is dry.   Your legs or another part of your body is  swollen.  SEEK IMMEDIATE MEDICAL CARE IF:   You feel confused.   Your vision is blurry.  You feel faint or pass out.   You have a severe headache.   You have severe abdominal, pelvic, or back pain.   You have chest pain, shortness of breath, or an irregular or fast heartbeat.   You are unable to urinate or you urinate less than normal.   You develop abnormal bleeding, such as bleeding from the rectum, vagina, nose, lungs, or nipples.  You vomit blood.   You have thoughts about harming yourself or committing suicide.   You are worried that you might harm someone else.    This information is not intended to replace advice given to you by your health care provider. Make sure you discuss any questions you have with your health care provider.   Document Released: 07/12/2007 Document Revised: 10/05/2014 Document Reviewed: 01/16/2014 Elsevier Interactive Patient Education [employer].

## 2016-04-06 NOTE — ED Notes (Signed)
Pt still upset about staff asking for UA.   Given water.

## 2016-04-07 ENCOUNTER — Telehealth: Payer: Self-pay | Admitting: *Deleted

## 2016-05-12 ENCOUNTER — Emergency Department (HOSPITAL_COMMUNITY)
Admission: EM | Admit: 2016-05-12 | Discharge: 2016-05-12 | Disposition: A | Discharge status: Home / Self Care | Payer: Medicare Other | Attending: Emergency Medicine | Admitting: Emergency Medicine

## 2016-05-12 ENCOUNTER — Emergency Department (HOSPITAL_COMMUNITY): Payer: Medicare Other

## 2016-05-12 DIAGNOSIS — F17210 Nicotine dependence, cigarettes, uncomplicated: Secondary | ICD-10-CM | POA: Insufficient documentation

## 2016-05-12 DIAGNOSIS — I110 Hypertensive heart disease with heart failure: Secondary | ICD-10-CM | POA: Insufficient documentation

## 2016-05-12 DIAGNOSIS — Z7952 Long term (current) use of systemic steroids: Secondary | ICD-10-CM | POA: Insufficient documentation

## 2016-05-12 DIAGNOSIS — S199XXA Unspecified injury of neck, initial encounter: Secondary | ICD-10-CM | POA: Diagnosis not present

## 2016-05-12 DIAGNOSIS — Z79899 Other long term (current) drug therapy: Secondary | ICD-10-CM | POA: Diagnosis not present

## 2016-05-12 DIAGNOSIS — W0110XA Fall on same level from slipping, tripping and stumbling with subsequent striking against unspecified object, initial encounter: Secondary | ICD-10-CM | POA: Insufficient documentation

## 2016-05-12 DIAGNOSIS — J441 Chronic obstructive pulmonary disease with (acute) exacerbation: Secondary | ICD-10-CM | POA: Insufficient documentation

## 2016-05-12 DIAGNOSIS — S4992XA Unspecified injury of left shoulder and upper arm, initial encounter: Secondary | ICD-10-CM | POA: Diagnosis not present

## 2016-05-12 DIAGNOSIS — M25512 Pain in left shoulder: Secondary | ICD-10-CM | POA: Diagnosis not present

## 2016-05-12 DIAGNOSIS — S299XXA Unspecified injury of thorax, initial encounter: Secondary | ICD-10-CM | POA: Diagnosis not present

## 2016-05-12 DIAGNOSIS — Z7982 Long term (current) use of aspirin: Secondary | ICD-10-CM | POA: Diagnosis not present

## 2016-05-12 DIAGNOSIS — Y999 Unspecified external cause status: Secondary | ICD-10-CM | POA: Insufficient documentation

## 2016-05-12 DIAGNOSIS — I5022 Chronic systolic (congestive) heart failure: Secondary | ICD-10-CM | POA: Insufficient documentation

## 2016-05-12 DIAGNOSIS — R0781 Pleurodynia: Secondary | ICD-10-CM | POA: Diagnosis not present

## 2016-05-12 DIAGNOSIS — I2510 Atherosclerotic heart disease of native coronary artery without angina pectoris: Secondary | ICD-10-CM | POA: Insufficient documentation

## 2016-05-12 DIAGNOSIS — W19XXXA Unspecified fall, initial encounter: Secondary | ICD-10-CM

## 2016-05-12 DIAGNOSIS — Y939 Activity, unspecified: Secondary | ICD-10-CM | POA: Insufficient documentation

## 2016-05-12 DIAGNOSIS — Y929 Unspecified place or not applicable: Secondary | ICD-10-CM | POA: Insufficient documentation

## 2016-05-12 DIAGNOSIS — S0990XA Unspecified injury of head, initial encounter: Secondary | ICD-10-CM | POA: Diagnosis not present

## 2016-05-12 MED ORDER — ACETAMINOPHEN 325 MG PO TABS
650.0000 mg | ORAL_TABLET | Freq: Once | ORAL | Status: AC
  Administered 2016-05-12: 650 mg via ORAL
  Filled 2016-05-12: qty 2

## 2016-05-12 NOTE — ED Triage Notes (Addendum)
Pt presents from home via [facility] after a mechanical fall. L sided rib pain from where he landed. No crepitus or deformity noted per EMS. Pt is wheezing slightly but hx of COPD. Alert and oriented but HOH.

## 2016-05-12 NOTE — ED Provider Notes (Signed)
[facility] DEPT Provider Note   CSN: [id number] Arrival date & time: 05/12/16  1802     History   Chief Complaint Chief Complaint  Patient presents with  . Fall    HPI Anthony Bolton is a 65 y.o. male.   Fall     Anthony Bolton is a 65 y.o. male with PMH significant for CHF, COPD, HLD, HOH, and migraines who presents with fall from standing today at 11:30 AM.  He states his feet got tripped up.  He landed on his left side/lower chest and then hit his head.  Denies LOC.  Denies syncope, CP, dizziness, SOB, or lightheadedness.  No fever or cough.  No N/V/D or abdominal pain.  No medications PTA.  He is not anticoagulated, does take ASA 81 mg.  He is also complaining of HA c/w previous migraines.   Past Medical History:  Diagnosis Date  . CHF (congestive heart failure) (HCC) EF 20 - 25% in 2015  . COPD (chronic obstructive pulmonary disease) ([place])   . HLD (hyperlipidemia)   . HOH (hard of hearing)   . Migraines     Patient Active Problem List   Diagnosis Date Noted  . Transaminasemia 12/03/2015  . Dehydration   . High anion gap metabolic acidosis 123456  . Malnutrition due to starvation ([place]) 12/02/2015  . Leukocytosis 12/02/2015  . Abdominal pain 10/12/2015  . Nausea & vomiting 10/12/2015  . Chest wall pain 10/12/2015  . AKI (acute kidney injury) ([place]) 10/12/2015  . SBO (small bowel obstruction) ([place]) 10/12/2015  . Acute respiratory failure ([place]) 08/11/2015  . Hard of hearing 08/11/2015  . CAD (nonobstructive per cath 2015) 08/11/2015  . HTN (hypertension) 08/11/2015  . HLD (hyperlipidemia)   . Malnutrition of moderate degree ([place]) 03/02/2015  . COPD exacerbation ([place]) 03/01/2015  . Chronic systolic congestive heart failure, NYHA class 3 ([place]) 03/01/2015  . Hyperthyroidism 11/06/2013  . Mass of adrenal gland ([place]) 11/04/2013  . Tobacco abuse 11/04/2013  . Hyponatremia 10/31/2013  . COPD (chronic obstructive pulmonary disease) ([place]) 10/31/2013     Past Surgical History:  Procedure Laterality Date  . APPENDECTOMY    . cardiac stents     No stent history  . LEFT HEART CATHETERIZATION WITH CORONARY ANGIOGRAM N/A 11/07/2013   Procedure: LEFT HEART CATHETERIZATION WITH CORONARY ANGIOGRAM;  Surgeon: Clent Demark, MD;  Location[facility] CATH LAB;  Service: Cardiovascular;  Laterality: N/A;       Home Medications    Prior to Admission medications   Medication Sig Start Date End Date Taking? Authorizing Provider  albuterol (PROVENTIL HFA;VENTOLIN HFA) 108 (90 Base) MCG/ACT inhaler Inhale 2 puffs into the lungs every 4 (four) hours as needed for wheezing or shortness of breath. 01/08/16   Gloriann Loan, PA-C  albuterol (PROVENTIL) (2.5 MG/3ML) 0.083% nebulizer solution Inhale 3 mLs into the lungs every 4 (four) hours as needed for wheezing.  12/31/15   Historical Provider, MD  aspirin 81 MG EC tablet Take 1 tablet (81 mg total) by mouth daily. Patient taking differently: Take 162 mg by mouth daily. Take 162 mgs daily, may take an additional 162 mgs daily as needed for pain 12/07/15   Modena Jansky, MD  budesonide-formoterol [facility]) 160-4.5 MCG/ACT inhaler Inhale 2 puffs into the lungs 2 (two) times daily. 12/07/15   Modena Jansky, MD  budesonide-formoterol (SYMBICORT) 160-4.5 MCG/ACT inhaler Inhale 2 puffs into the lungs 2 (two) times daily. 01/17/16   Stevi Barrett, PA-C  Multiple Vitamin (MULTIVITAMIN  WITH MINERALS) TABS tablet Take 1 tablet by mouth daily. 12/07/15   Modena Jansky, MD  polyethylene glycol (MIRALAX / GLYCOLAX) packet Take 17 g by mouth daily. 12/07/15   Modena Jansky, MD  predniSONE (DELTASONE) 20 MG tablet Take 2 tablets (40 mg total) by mouth daily. Patient not taking: Reported on 01/17/2016 01/08/16   Gloriann Loan, PA-C  senna (SENOKOT) 8.6 MG TABS tablet Take 2 tablets (17.2 mg total) by mouth daily as needed for mild constipation or moderate constipation. 12/07/15   Modena Jansky, MD  traZODone (DESYREL) 50 MG  tablet Take 1 tablet by mouth as needed. sleep 12/16/15   Historical Provider, MD    Family History Family History  Problem Relation Age of Onset  . Stroke Mother     Social History Social History  Substance Use Topics  . Smoking status: Current Every Day Smoker    Packs/day: 0.50    Types: Cigarettes  . Smokeless tobacco: Never Used  . Alcohol use No     Allergies   Review of patient's allergies indicates no known allergies.   Review of Systems Review of Systems All other systems negative unless otherwise stated in HPI   Physical Exam Updated Vital Signs BP 116/94 (BP Location: Left Arm)   Pulse 93   Temp 98 F (36.7 C) (Oral)   Resp 17   SpO2 94%   Physical Exam  Constitutional: He is oriented to person, place, and time. He appears well-developed and well-nourished.  Non-toxic appearance. He does not have a sickly appearance. He does not appear ill.  HENT:  Head: Normocephalic and atraumatic.  Mouth/Throat: Oropharynx is clear and moist.  Eyes: Conjunctivae are normal. Pupils are equal, round, and reactive to light.  Neck: Normal range of motion. Neck supple.  Cardiovascular: Normal rate and regular rhythm.   Pulmonary/Chest: Effort normal and breath sounds normal. No accessory muscle usage or stridor. No respiratory distress. He has no wheezes. He has no rhonchi. He has no rales.  Abdominal: Soft. Bowel sounds are normal. He exhibits no distension. There is no tenderness.  Musculoskeletal: Normal range of motion. He exhibits tenderness.  Left shoulder mildly TTP. No cervical midline tenderness.  Lymphadenopathy:    He has no cervical adenopathy.  Neurological: He is alert and oriented to person, place, and time.  Speech clear without dysarthria.  Skin: Skin is warm and dry.  Psychiatric: He has a normal mood and affect. His behavior is normal.     ED Treatments / Results  Labs (all labs ordered are listed, but only abnormal results are displayed) Labs  Reviewed - No data to display  EKG  EKG Interpretation None       Radiology Dg Chest 2 View  Result Date: 05/12/2016 CLINICAL DATA:  Status post fall at home, with left shoulder pain. Initial encounter. EXAM: CHEST  2 VIEW COMPARISON:  Chest radiograph performed 04/06/2016 FINDINGS: The lungs are hyperexpanded, with flattening of the hemidiaphragms, compatible with COPD. No pleural effusion or pneumothorax is seen. The heart is normal in size. No acute osseous abnormalities are identified. 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 IMPRESSION: Findings of COPD.  No acute osseous abnormality seen. Electronically Signed   By: Garald Balding M.D.   On: 05/12/2016 19:39   Ct Head Wo Contrast  Result Date: 05/12/2016 CLINICAL DATA:  Fall. EXAM: CT HEAD WITHOUT CONTRAST CT CERVICAL SPINE WITHOUT CONTRAST TECHNIQUE: Multidetector CT imaging of the  head and cervical spine was performed following the standard protocol without intravenous contrast. Multiplanar CT image reconstructions of the cervical spine were also generated. COMPARISON:  CT head 11/07/2013 FINDINGS: CT HEAD FINDINGS Mild atrophy. Negative for hydrocephalus. Mild chronic microvascular ischemic change in the white matter. Negative for acute infarct. Negative for intracranial hemorrhage. No mass or edema. Negative for skull fracture. Ill-defined 6 mm lesion in the right frontal bone unchanged from the prior study. Paranasal sinuses clear. Mastoid sinus is clear. CT CERVICAL SPINE FINDINGS Normal alignment.  Straightening of the cervical lordosis. Disc degeneration and spondylosis C4-5, C5-6, C6-7 of a moderate degree. Negative for cervical spine fracture. IMPRESSION: No acute intracranial abnormality Negative for cervical spine fracture. Electronically Signed   By: Franchot Gallo M.D.   On: 05/12/2016 19:38   Ct Cervical Spine Wo Contrast  Result Date: 05/12/2016 CLINICAL DATA:  Fall. EXAM:  CT HEAD WITHOUT CONTRAST CT CERVICAL SPINE WITHOUT CONTRAST TECHNIQUE: Multidetector CT imaging of the head and cervical spine was performed following the standard protocol without intravenous contrast. Multiplanar CT image reconstructions of the cervical spine were also generated. COMPARISON:  CT head 11/07/2013 FINDINGS: CT HEAD FINDINGS Mild atrophy. Negative for hydrocephalus. Mild chronic microvascular ischemic change in the white matter. Negative for acute infarct. Negative for intracranial hemorrhage. No mass or edema. Negative for skull fracture. Ill-defined 6 mm lesion in the right frontal bone unchanged from the prior study. Paranasal sinuses clear. Mastoid sinus is clear. CT CERVICAL SPINE FINDINGS Normal alignment.  Straightening of the cervical lordosis. Disc degeneration and spondylosis C4-5, C5-6, C6-7 of a moderate degree. Negative for cervical spine fracture. IMPRESSION: No acute intracranial abnormality Negative for cervical spine fracture. Electronically Signed   By: Franchot Gallo M.D.   On: 05/12/2016 19:38   Dg Shoulder Left  Result Date: 05/12/2016 CLINICAL DATA:  Status post fall, with left shoulder pain. Initial encounter. EXAM: LEFT SHOULDER - 2+ VIEW COMPARISON:  Left shoulder radiographs performed 06/15/2011 FINDINGS: There is no evidence of acute fracture or dislocation. 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 The left acromioclavicular joint is grossly unremarkable. The left humeral head remains seated at the glenoid fossa. The visualized portions of the left lung are grossly clear. No definite soft tissue abnormalities are characterized on radiograph. IMPRESSION: No evidence of acute fracture or dislocation. 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 Electronically Signed   By: Garald Balding M.D.   On: 05/12/2016 19:38    Procedures Procedures (including critical care  time)  Medications Ordered in ED Medications  acetaminophen (TYLENOL) tablet 650 mg (650 mg Oral Given 05/12/16 1853)     Initial Impression / Assessment and Plan / ED Course  I have reviewed the triage vital signs and the nursing notes.  Pertinent labs & imaging results that were available during my care of the patient were reviewed by me and considered in my medical decision making (see chart for details).  Clinical Course   Patient presents with mechanical fall.  VSS, NAD. No focal neurological deficits on exam.  No obvious signs of trauma.  Imaging without acute abnormalities. Plan to discharge home with symptomatic treatment.  Return precautions discussed.  Follow up PCP.  Case has been discussed with and seen by Dr. Leonette Monarch who agrees with the above plan for discharge.    Final Clinical Impressions(s) / ED Diagnoses   Final diagnoses:  Fall, initial encounter  Left shoulder pain  Rib pain on left side    New Prescriptions New Prescriptions   No medications on file     Vivien Rossetti 05/12/16 2042    Fatima Blank, MD 05/13/16 [phone]

## 2016-05-12 NOTE — ED Notes (Signed)
Bed: [id number] Expected date: 05/12/16 Expected time: 6:02 PM Means of arrival:  Comments: 31M/fall/wheezing

## 2016-05-12 NOTE — ED Notes (Signed)
Pt is ambulatory and was given bus pass to ride home. Pt was going to be escorted to bus stop by security but upon exiting ED, pt became belligerent and  bounded up out of wheelchair and stated that he would need a taxi voucher or to 'call a taxi so and I can just pay them later.' Charge RN and security made aware of situation.

## 2016-05-27 ENCOUNTER — Telehealth: Payer: Self-pay | Admitting: *Deleted

## 2016-05-30 IMAGING — CR DG CHEST 2V
2 series · 2 of 2 positions shown · non-contrast
Comparison: Chest radiograph performed 11/08/2015

CLINICAL DATA: Acute onset of worsening shortness of breath and
chest tightness. Initial encounter.

EXAM:
CHEST  2 VIEW

[w chest pa]
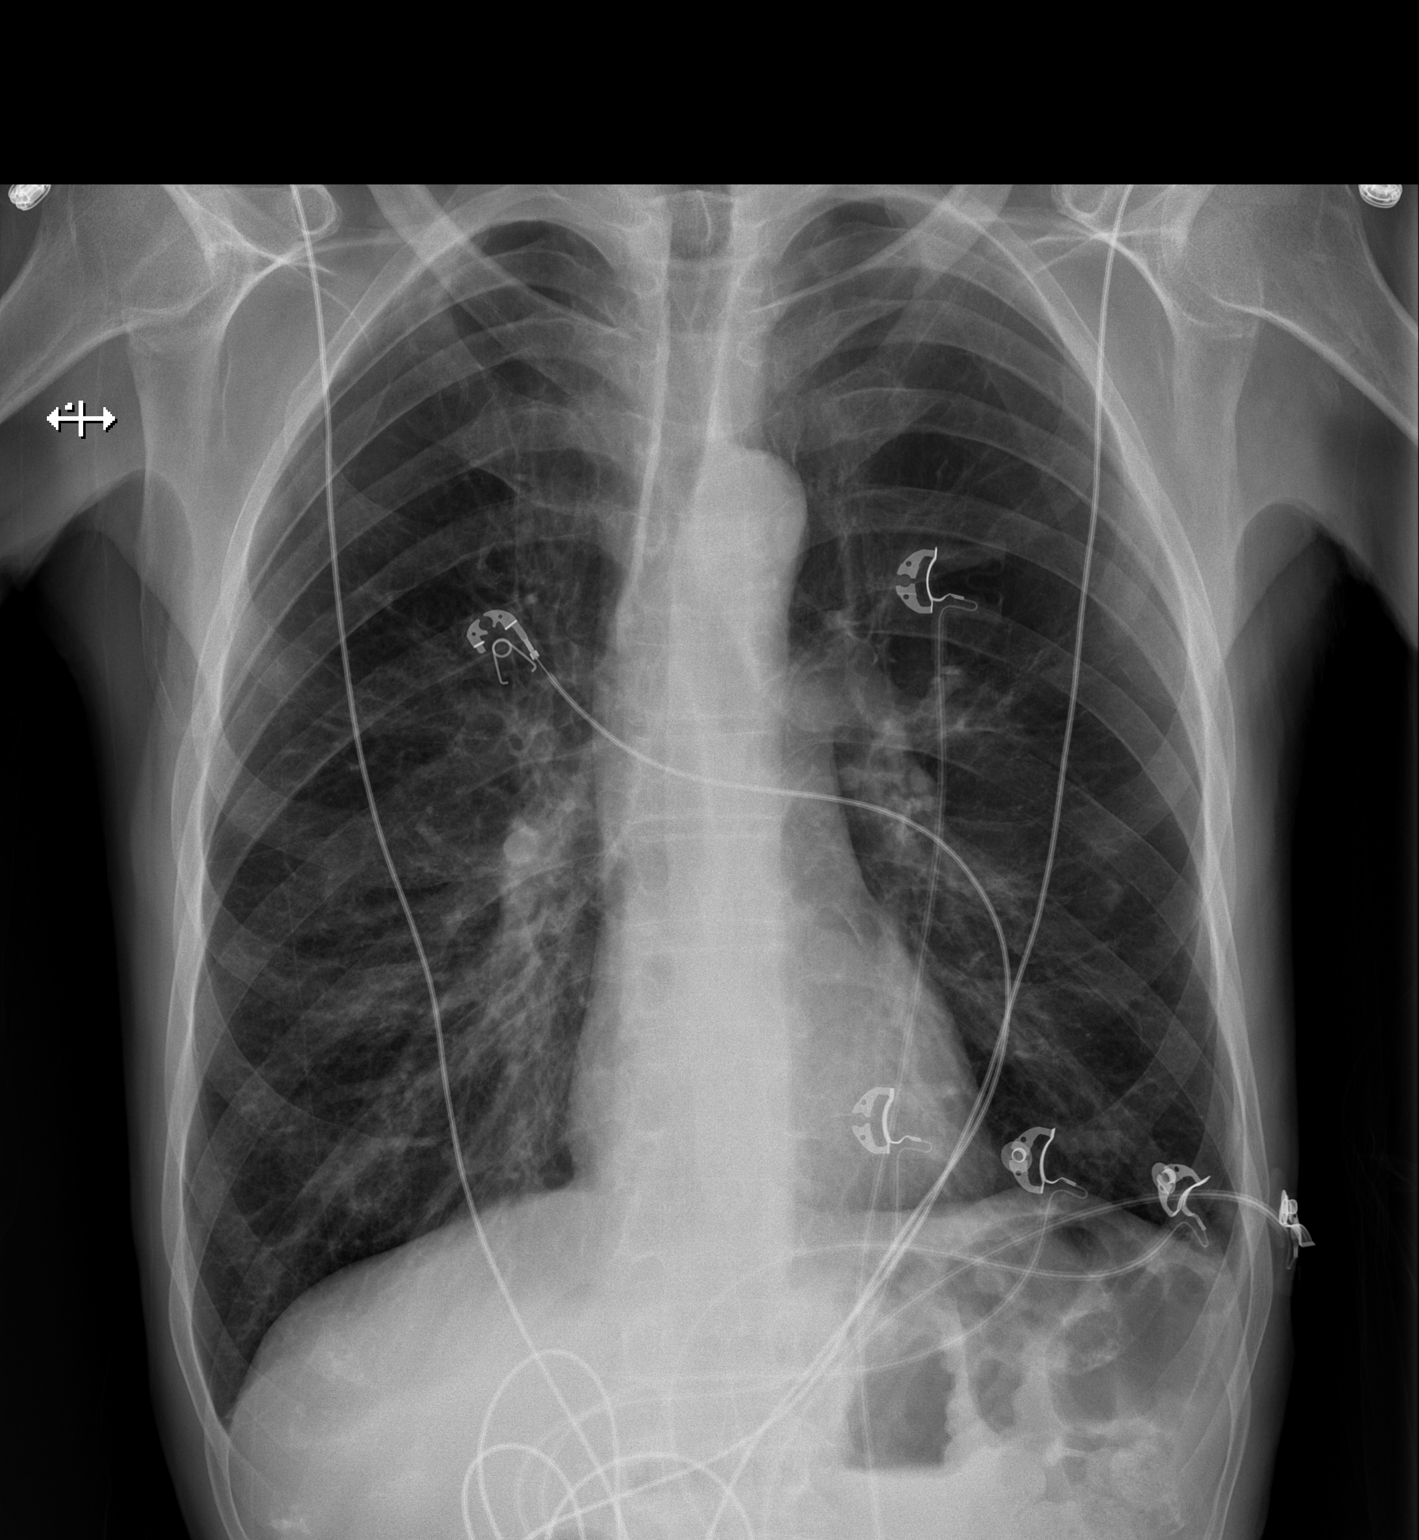

[w chest lat]
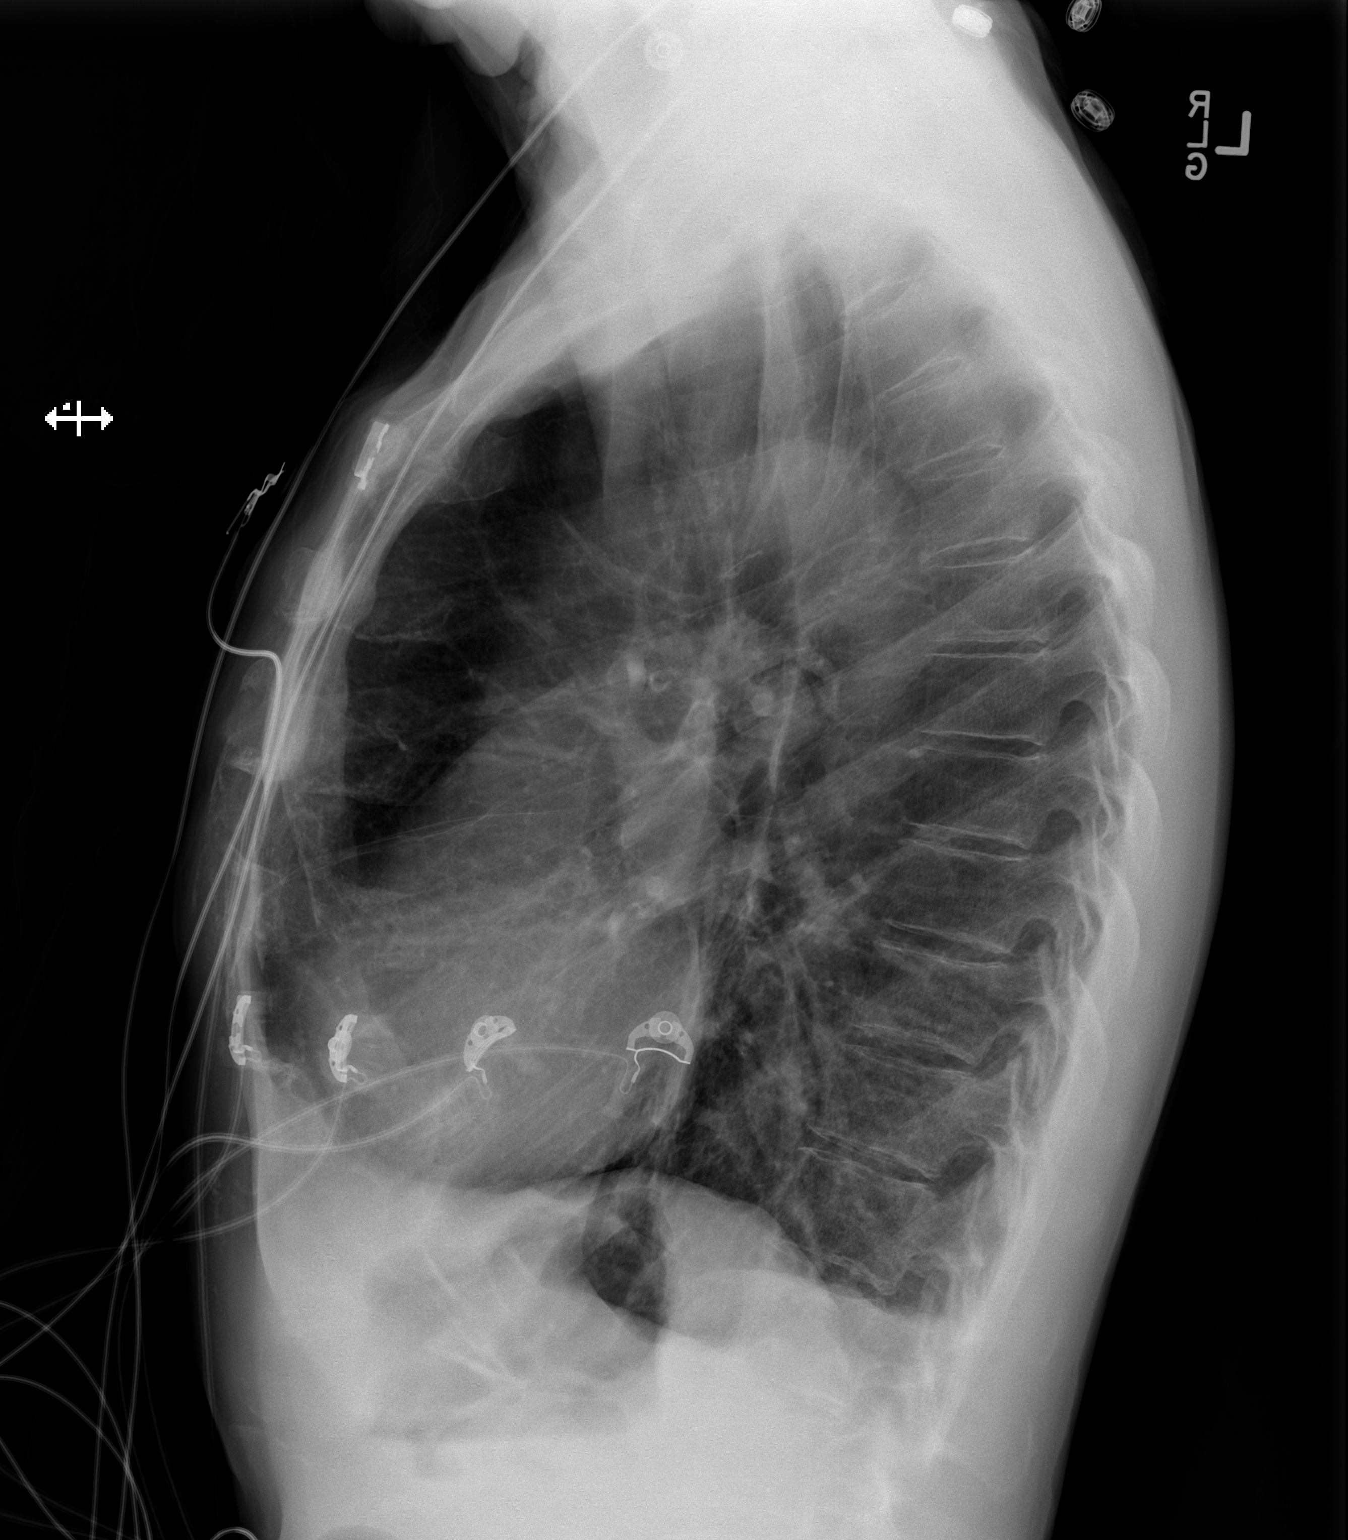

[2 of 2 positions shown; findings below may reference images not displayed]

FINDINGS: The lungs are hyperexpanded, with flattening of the hemidiaphragms,
compatible with COPD. Peribronchial thickening is noted. Mildly
increased interstitial markings appear chronic in nature. There is
no definite evidence of superimposed focal opacification, pleural
effusion or pneumothorax.

The heart is normal in size; the mediastinal contour is within
normal limits. No acute osseous abnormalities are seen.
IMPRESSION: Findings of COPD. No definite superimposed airspace consolidation,
though minimal right basilar opacity cannot be entirely excluded,
depending on the patient's symptoms.

## 2016-06-11 IMAGING — CR DG CHEST 2V
3 series · 3 of 3 positions shown · non-contrast
Comparison: 11/20/2015 and 11/08/2015.

CLINICAL DATA: Shortness of breath.

EXAM:
CHEST  2 VIEW

[w chest lat]
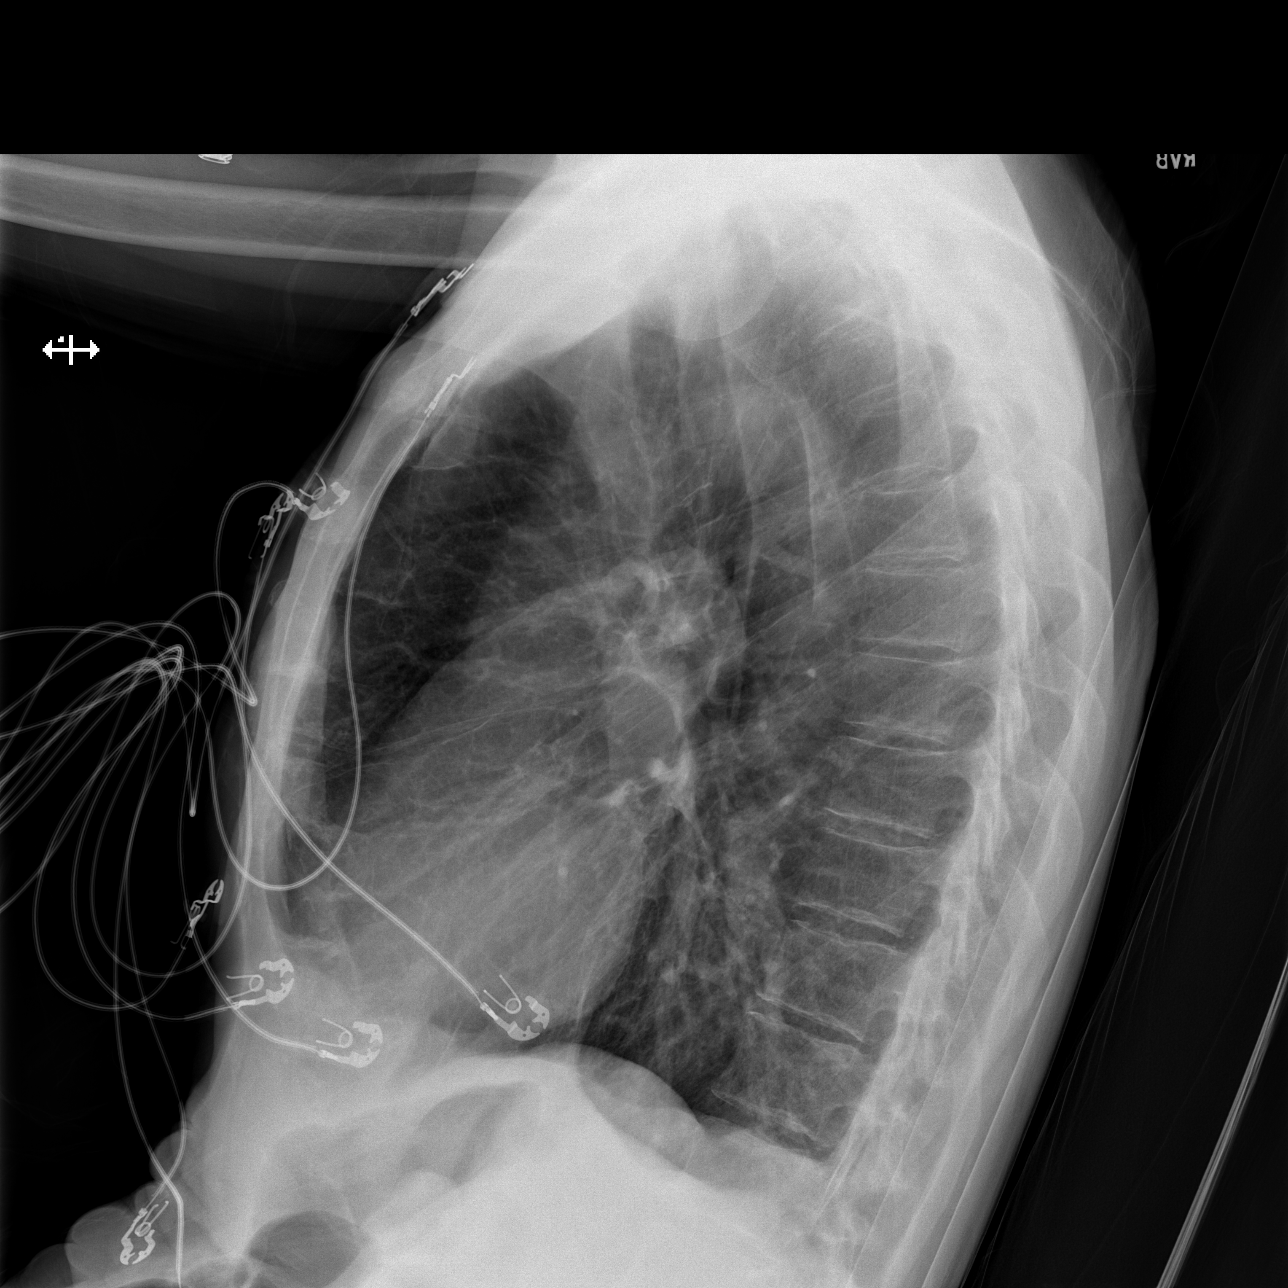

[x chest ap (1 of 2)]
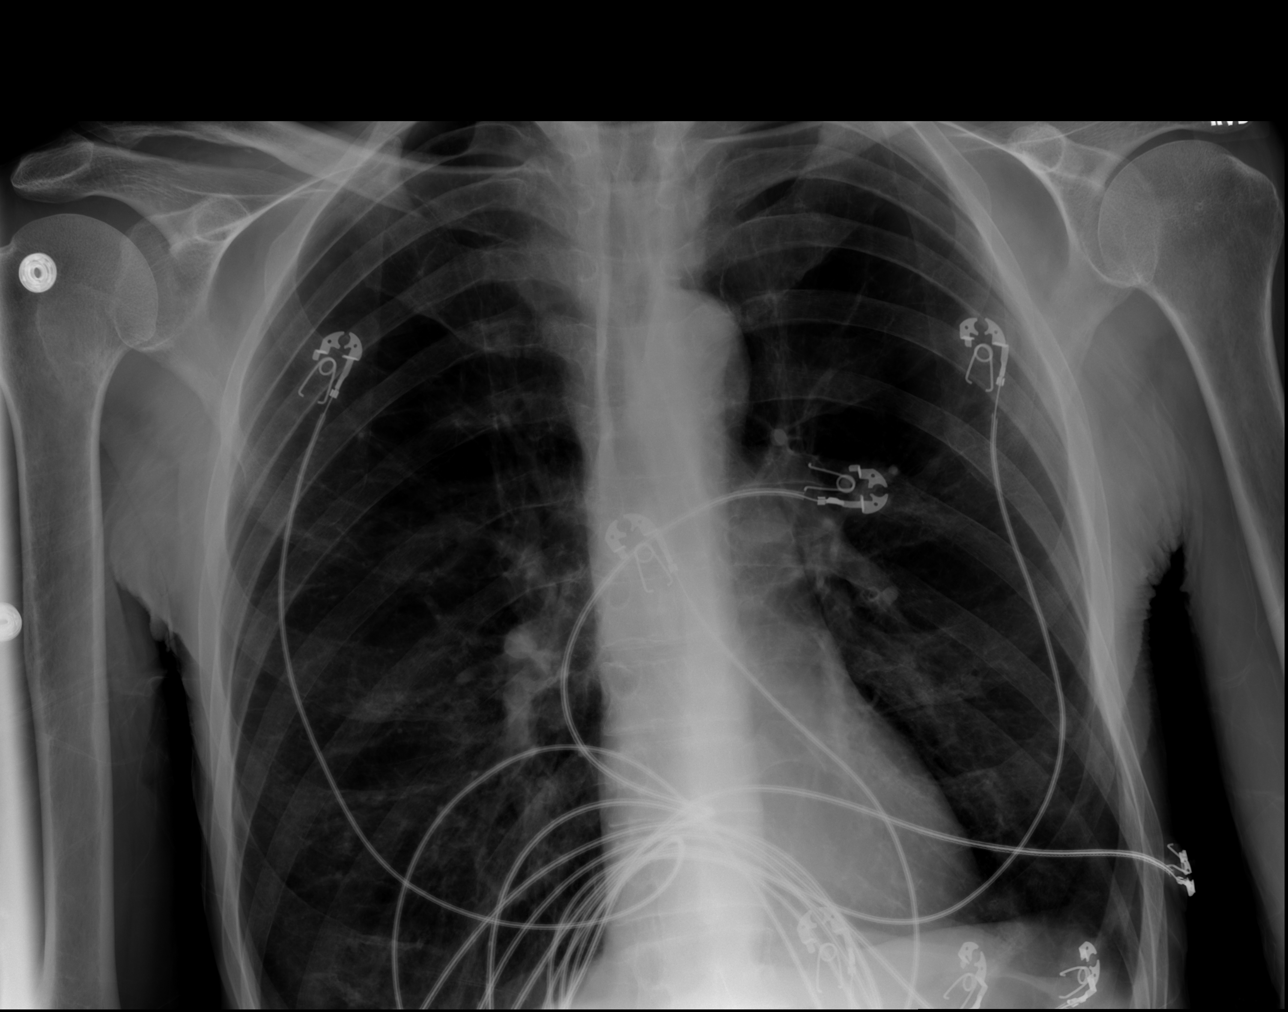

[x chest ap (2 of 2)]
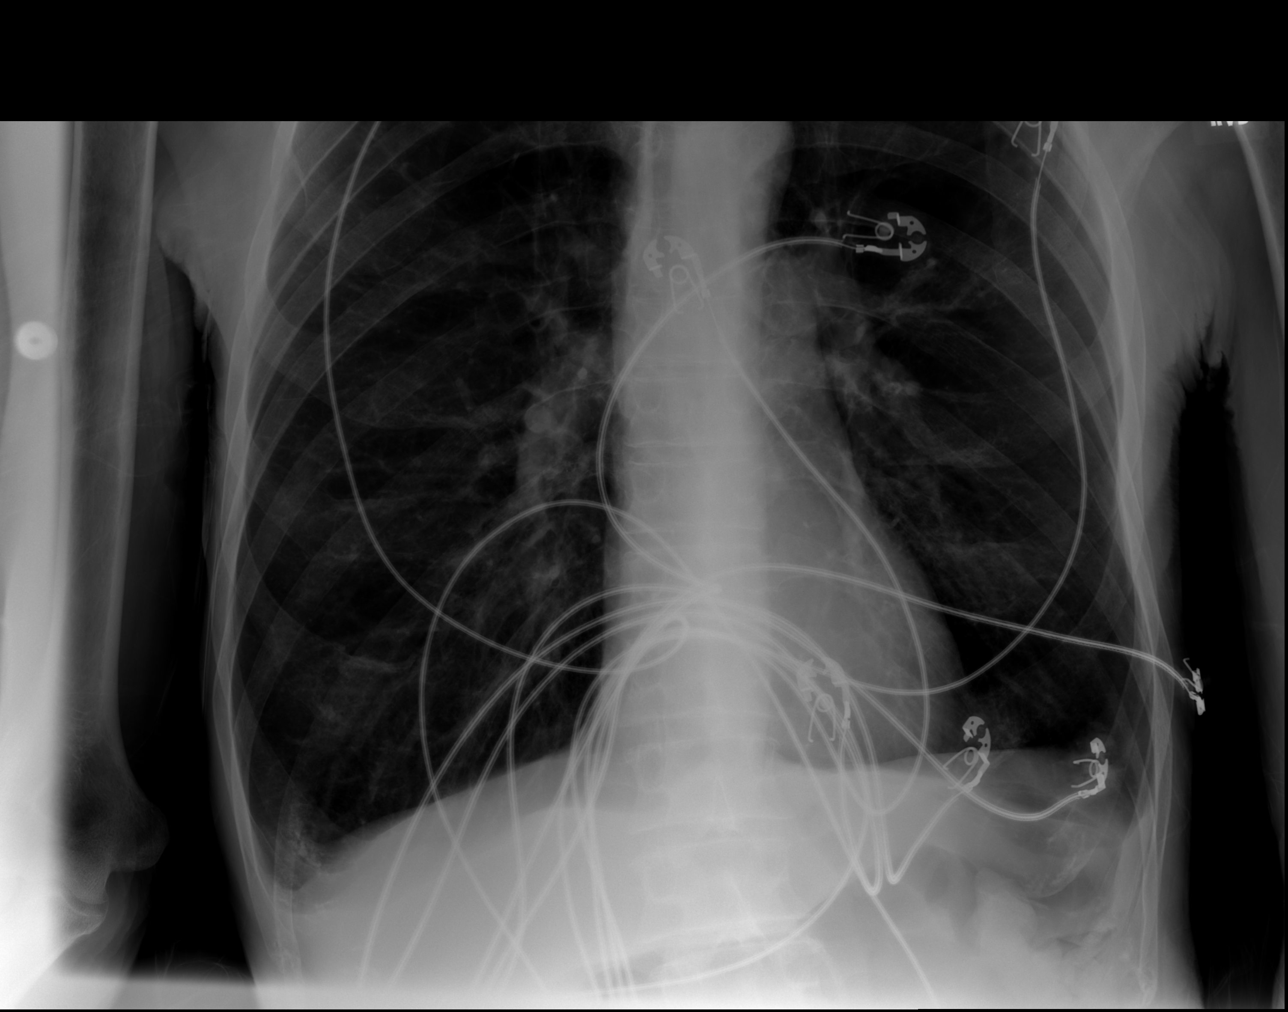

[3 of 3 positions shown; findings below may reference images not displayed]

FINDINGS: The heart size and mediastinal contours are stable. The lungs remain
hyperinflated with flattening of the diaphragm. No superimposed
airspace disease, pleural effusion or pneumothorax demonstrated. The
bones appear unchanged.
IMPRESSION: Stable chronic findings of moderate COPD. No acute superimposed
process.

## 2016-07-01 DIAGNOSIS — I10 Essential (primary) hypertension: Secondary | ICD-10-CM | POA: Diagnosis not present

## 2016-07-01 DIAGNOSIS — G4700 Insomnia, unspecified: Secondary | ICD-10-CM | POA: Diagnosis not present

## 2016-07-01 DIAGNOSIS — J441 Chronic obstructive pulmonary disease with (acute) exacerbation: Secondary | ICD-10-CM | POA: Diagnosis not present

## 2016-07-01 DIAGNOSIS — Z125 Encounter for screening for malignant neoplasm of prostate: Secondary | ICD-10-CM | POA: Diagnosis not present

## 2016-08-12 ENCOUNTER — Emergency Department (HOSPITAL_COMMUNITY)
Admission: EM | Admit: 2016-08-12 | Discharge: 2016-08-13 | Disposition: A | Discharge status: Home / Self Care | Payer: Medicare Other | Attending: Emergency Medicine | Admitting: Emergency Medicine

## 2016-08-12 ENCOUNTER — Encounter (HOSPITAL_COMMUNITY): Payer: Self-pay | Admitting: *Deleted

## 2016-08-12 DIAGNOSIS — I5022 Chronic systolic (congestive) heart failure: Secondary | ICD-10-CM | POA: Diagnosis not present

## 2016-08-12 DIAGNOSIS — R0600 Dyspnea, unspecified: Secondary | ICD-10-CM | POA: Diagnosis present

## 2016-08-12 DIAGNOSIS — F17210 Nicotine dependence, cigarettes, uncomplicated: Secondary | ICD-10-CM | POA: Diagnosis not present

## 2016-08-12 DIAGNOSIS — Z7982 Long term (current) use of aspirin: Secondary | ICD-10-CM | POA: Insufficient documentation

## 2016-08-12 DIAGNOSIS — Z79899 Other long term (current) drug therapy: Secondary | ICD-10-CM | POA: Diagnosis not present

## 2016-08-12 DIAGNOSIS — R0602 Shortness of breath: Secondary | ICD-10-CM | POA: Diagnosis not present

## 2016-08-12 DIAGNOSIS — I110 Hypertensive heart disease with heart failure: Secondary | ICD-10-CM | POA: Insufficient documentation

## 2016-08-12 DIAGNOSIS — J439 Emphysema, unspecified: Secondary | ICD-10-CM | POA: Diagnosis not present

## 2016-08-12 DIAGNOSIS — J441 Chronic obstructive pulmonary disease with (acute) exacerbation: Secondary | ICD-10-CM | POA: Insufficient documentation

## 2016-08-12 NOTE — ED Triage Notes (Signed)
Patient is alert and oriented x4.  He is coming from a hotel where he called EMS for respiratory distress.  Patient has a Hx of COPD and uses an inhaler.  Patient has had 125mg  of solumedrol, 10mg  of albuterol, 0.5mg  of atrovent and 2grams of magnesium.  EMS states that the patient has improved but still has expiratory wheezing.

## 2016-08-12 NOTE — ED Provider Notes (Signed)
[facility] DEPT Provider Note   CSN: [id number] Arrival date & time: 08/12/16  2323  By signing my name below, I, Doran Stabler, attest that this documentation has been prepared under the direction and in the presence of Orpah Greek, MD. Electronically Signed: Doran Stabler, ED Scribe. 08/13/16. 11:33 PM.  History   Chief Complaint Chief Complaint  Patient presents with  . Respiratory Distress    The history is provided by the patient, medical records and the EMS personnel.   HPI Comments: Anthony Bolton is a 65 y.o. male who presents to the Emergency Department via EMS with a PMHx of COPD and CHF complaining of a sudden onset of SOB that began prior to arrival. EMS arrived at a hotel from which the pt had called from and found the pt is respiratory distress. There are no aggravating or alleviating factors. EMS administerd 125mg  of solumedrol, 10 mg of albuterol, 0.5 mg of atrovent and 2 mg of magnesium. Since then, the pt has been improving. Pt states he is out of is Spiriva. Pt denies any fevers, chills, CP, N/V/D or any other symptoms at this time.   Past Medical History:  Diagnosis Date  . CHF (congestive heart failure) (HCC) EF 20 - 25% in 2015  . COPD (chronic obstructive pulmonary disease) ([place])   . HLD (hyperlipidemia)   . HOH (hard of hearing)   . Migraines    Patient Active Problem List   Diagnosis Date Noted  . Transaminasemia 12/03/2015  . Dehydration   . High anion gap metabolic acidosis 123456  . Malnutrition due to starvation ([place]) 12/02/2015  . Leukocytosis 12/02/2015  . Abdominal pain 10/12/2015  . Nausea & vomiting 10/12/2015  . Chest wall pain 10/12/2015  . AKI (acute kidney injury) ([place]) 10/12/2015  . SBO (small bowel obstruction) 10/12/2015  . Acute respiratory failure ([place]) 08/11/2015  . Hard of hearing 08/11/2015  . CAD (nonobstructive per cath 2015) 08/11/2015  . HTN (hypertension) 08/11/2015  . HLD (hyperlipidemia)   .  Malnutrition of moderate degree ([place]) 03/02/2015  . COPD exacerbation ([place]) 03/01/2015  . Chronic systolic congestive heart failure, NYHA class 3 ([place]) 03/01/2015  . Hyperthyroidism 11/06/2013  . Mass of adrenal gland ([place]) 11/04/2013  . Tobacco abuse 11/04/2013  . Hyponatremia 10/31/2013  . COPD (chronic obstructive pulmonary disease) ([place]) 10/31/2013   Past Surgical History:  Procedure Laterality Date  . APPENDECTOMY    . cardiac stents     No stent history  . LEFT HEART CATHETERIZATION WITH CORONARY ANGIOGRAM N/A 11/07/2013   Procedure: LEFT HEART CATHETERIZATION WITH CORONARY ANGIOGRAM;  Surgeon: Clent Demark, MD;  Location: [facility] CATH LAB;  Service: Cardiovascular;  Laterality: N/A;    Home Medications    Prior to Admission medications   Medication Sig Start Date End Date Taking? Authorizing Provider  aspirin 325 MG tablet Take 325 mg by mouth daily.   Yes Historical Provider, MD  albuterol (PROVENTIL HFA;VENTOLIN HFA) 108 (90 Base) MCG/ACT inhaler Inhale 2 puffs into the lungs every 4 (four) hours as needed for wheezing or shortness of breath. 08/13/16   Orpah Greek, MD  albuterol (PROVENTIL) (2.5 MG/3ML) 0.083% nebulizer solution Inhale 3 mLs into the lungs every 4 (four) hours as needed for wheezing. 08/13/16   Orpah Greek, MD  aspirin 81 MG EC tablet Take 1 tablet (81 mg total) by mouth daily. Patient not taking: Reported on 08/13/2016 12/07/15   Modena Jansky, MD  budesonide-formoterol [facility]) 160-4.5 MCG/ACT inhaler  Inhale 2 puffs into the lungs 2 (two) times daily. Patient not taking: Reported on 08/13/2016 12/07/15   Modena Jansky, MD  budesonide-formoterol [facility]) 160-4.5 MCG/ACT inhaler Inhale 2 puffs into the lungs 2 (two) times daily. 08/13/16   Orpah Greek, MD  doxycycline (VIBRAMYCIN) 100 MG capsule Take 1 capsule (100 mg total) by mouth 2 (two) times daily. 08/13/16   Orpah Greek, MD  Multiple Vitamin (MULTIVITAMIN  WITH MINERALS) TABS tablet Take 1 tablet by mouth daily. Patient not taking: Reported on 08/13/2016 12/07/15   Modena Jansky, MD  polyethylene glycol (MIRALAX / GLYCOLAX) packet Take 17 g by mouth daily. Patient not taking: Reported on 08/13/2016 12/07/15   Modena Jansky, MD  predniSONE (DELTASONE) 20 MG tablet 3 tabs po daily x 3 days, then 2 tabs x 3 days, then 1.5 tabs x 3 days, then 1 tab x 3 days, then 0.5 tabs x 3 days 08/13/16   Orpah Greek, MD  senna (SENOKOT) 8.6 MG TABS tablet Take 2 tablets (17.2 mg total) by mouth daily as needed for mild constipation or moderate constipation. Patient not taking: Reported on 08/13/2016 12/07/15   Modena Jansky, MD  tiotropium (SPIRIVA) 18 MCG inhalation capsule Place 1 capsule (18 mcg total) into inhaler and inhale daily. 08/13/16   Orpah Greek, MD    Family History Family History  Problem Relation Age of Onset  . Stroke Mother     Social History Social History  Substance Use Topics  . Smoking status: Current Every Day Smoker    Packs/day: 0.50    Types: Cigarettes  . Smokeless tobacco: Never Used  . Alcohol use No     Allergies   Patient has no known allergies.   Review of Systems Review of Systems  Constitutional: Negative for chills and fever.  Respiratory: Positive for shortness of breath.   Cardiovascular: Negative for chest pain.  Gastrointestinal: Negative for diarrhea, nausea and vomiting.  All other systems reviewed and are negative.  Physical Exam Updated Vital Signs BP 121/98 (BP Location: Left Arm)   Pulse 97   Temp 98.6 F (37 C) (Oral)   Ht 5\' 6"  (1.676 m)   Wt 105 lb (47.6 kg)   SpO2 98%   BMI 16.95 kg/m   Physical Exam  Constitutional: He is oriented to person, place, and time. No distress.  Very thin  HENT:  Head: Normocephalic and atraumatic.  Right Ear: Hearing normal.  Left Ear: Hearing normal.  Nose: Nose normal.  Mouth/Throat: Oropharynx is clear and moist and  mucous membranes are normal.  Eyes: Conjunctivae and EOM are normal. Pupils are equal, round, and reactive to light.  Neck: Normal range of motion. Neck supple.  Cardiovascular: Regular rhythm, S1 normal and S2 normal.  Exam reveals no gallop and no friction rub.   No murmur heard. Pulmonary/Chest: Accessory muscle usage present. No respiratory distress. He has decreased breath sounds. He has wheezes. He exhibits no tenderness.  Abdominal: Soft. Normal appearance and bowel sounds are normal. There is no hepatosplenomegaly. There is no tenderness. There is no rebound, no guarding, no tenderness at McBurney's point and negative Murphy's sign. No hernia.  Musculoskeletal: Normal range of motion.  Neurological: He is alert and oriented to person, place, and time. He has normal strength. No cranial nerve deficit or sensory deficit. Coordination normal. GCS eye subscore is 4. GCS verbal subscore is 5. GCS motor subscore is 6.  Skin: Skin is warm, dry and  intact. No rash noted. No cyanosis.  Psychiatric: He has a normal mood and affect. His speech is normal and behavior is normal. Thought content normal.  Nursing note and vitals reviewed.  ED Treatments / Results  DIAGNOSTIC STUDIES: Oxygen Saturation is 98% on room air, normal by my interpretation.    COORDINATION OF CARE: 11:33 PM Discussed treatment plan with pt at bedside and pt agreed to plan.  Labs (all labs ordered are listed, but only abnormal results are displayed) Labs Reviewed  CBC WITH DIFFERENTIAL/PLATELET - Abnormal; Notable for the following:       Result Value   WBC 11.7 (*)    Neutro Abs 10.4 (*)    All other components within normal limits  COMPREHENSIVE METABOLIC PANEL - Abnormal; Notable for the following:    Sodium 132 (*)    Chloride 96 (*)    Glucose, Bld 142 (*)    BUN 46 (*)    ALT 16 (*)    All other components within normal limits  BLOOD GAS, VENOUS    EKG  EKG Interpretation None       Radiology Dg  Chest 2 View  Result Date: 08/13/2016 CLINICAL DATA:  Initial evaluation for sudden onset shortness of breath. History of COPD, CHF, smoking. EXAM: CHEST  2 VIEW COMPARISON:  Prior radiograph 05/12/2016. FINDINGS: Cardiac and mediastinal silhouettes are stable in size and contour, and remain within normal limits. Lungs are hyperinflated with attenuation of the pulmonary markings, compatible with emphysema. Associated bilateral parenchymal scarring similar to previous. No focal infiltrates identified. No pulmonary edema or pleural effusion. No pneumothorax. No acute osseous abnormality. IMPRESSION: 1. No active cardiopulmonary disease. 2. Severe emphysema. Electronically Signed   By: Jeannine Boga M.D.   On: 08/13/2016 00:42    Procedures Procedures (including critical care time)  Medications Ordered in ED Medications - No data to display   Initial Impression / Assessment and Plan / ED Course  I have reviewed the triage vital signs and the nursing notes.  Pertinent labs & imaging results that were available during my care of the patient were reviewed by me and considered in my medical decision making (see chart for details).  Clinical Course    Patient presents with complaints of shortness of breath. Patient has a history of severe COPD. He is brought to the ER by EMS. He has been given 10 mg of albuterol, 0.5 mg of Atrovent, 125 mg of Solu-Medrol and 2 g of magnesium. Patient significant improvement during transport. Patient is now back to his baseline. Workup negative.  Final Clinical Impressions(s) / ED Diagnoses   Final diagnoses:  COPD exacerbation (HCC)    New Prescriptions New Prescriptions   DOXYCYCLINE (VIBRAMYCIN) 100 MG CAPSULE    Take 1 capsule (100 mg total) by mouth 2 (two) times daily.   PREDNISONE (DELTASONE) 20 MG TABLET    3 tabs po daily x 3 days, then 2 tabs x 3 days, then 1.5 tabs x 3 days, then 1 tab x 3 days, then 0.5 tabs x 3 days   I personally  performed the services described in this documentation, which was scribed in my presence. The recorded information has been reviewed and is accurate.     Orpah Greek, MD 08/13/16 [phone]

## 2016-08-13 ENCOUNTER — Emergency Department (HOSPITAL_COMMUNITY): Payer: Medicare Other

## 2016-08-13 DIAGNOSIS — J441 Chronic obstructive pulmonary disease with (acute) exacerbation: Secondary | ICD-10-CM | POA: Diagnosis not present

## 2016-08-13 DIAGNOSIS — J439 Emphysema, unspecified: Secondary | ICD-10-CM | POA: Diagnosis not present

## 2016-08-13 LAB — COMPREHENSIVE METABOLIC PANEL
ALBUMIN: 4.6 g/dL (ref 3.5–5.0)
ALK PHOS: 46 U/L (ref 38–126)
ALT: 16 U/L — ABNORMAL LOW (ref 17–63)
AST: 29 U/L (ref 15–41)
Anion gap: 12 (ref 5–15)
BILIRUBIN TOTAL: 0.8 mg/dL (ref 0.3–1.2)
BUN: 46 mg/dL — AB (ref 6–20)
CALCIUM: 9.2 mg/dL (ref 8.9–10.3)
CO2: 24 mmol/L (ref 22–32)
Chloride: 96 mmol/L — ABNORMAL LOW (ref 101–111)
Creatinine, Ser: 1.08 mg/dL (ref 0.61–1.24)
GFR calc Af Amer: >60 mL/min (ref >60)
GFR calc non Af Amer: >60 mL/min (ref >60)
GLUCOSE: 142 mg/dL — AB (ref 65–99)
Potassium: 3.5 mmol/L (ref 3.5–5.1)
Sodium: 132 mmol/L — ABNORMAL LOW (ref 135–145)
TOTAL PROTEIN: 6.8 g/dL (ref 6.5–8.1)

## 2016-08-13 LAB — CBC WITH DIFFERENTIAL/PLATELET
BASOS ABS: 0 10*3/uL (ref 0.0–0.1)
BASOS PCT: 0 %
Eosinophils Absolute: 0.1 10*3/uL (ref 0.0–0.7)
Eosinophils Relative: 1 %
HEMATOCRIT: 40.1 % (ref 39.0–52.0)
HEMOGLOBIN: 14.1 g/dL (ref 13.0–17.0)
LYMPHS PCT: 6 %
Lymphs Abs: 0.7 10*3/uL (ref 0.7–4.0)
MCH: 31.7 pg (ref 26.0–34.0)
MCHC: 35.2 g/dL (ref 30.0–36.0)
MCV: 90.1 fL (ref 78.0–100.0)
MONOS PCT: 5 %
Monocytes Absolute: 0.5 10*3/uL (ref 0.1–1.0)
NEUTROS ABS: 10.4 10*3/uL — AB (ref 1.7–7.7)
NEUTROS PCT: 88 %
Platelets: 240 10*3/uL (ref 150–400)
RBC: 4.45 MIL/uL (ref 4.22–5.81)
RDW: 14.9 % (ref 11.5–15.5)
WBC: 11.7 10*3/uL — ABNORMAL HIGH (ref 4.0–10.5)

## 2016-08-13 LAB — BLOOD GAS, VENOUS
ACID-BASE DEFICIT: 0.5 mmol/L (ref 0.0–2.0)
BICARBONATE: 25.2 mmol/L (ref 20.0–28.0)
Drawn by: 29408
FIO2: 21
O2 Saturation: 75 %
PATIENT TEMPERATURE: 98.6
pCO2, Ven: 47.9 mmHg (ref 44.0–60.0)
pH, Ven: 7.341 (ref 7.250–7.430)
pO2, Ven: 43.2 mmHg (ref 32.0–45.0)

## 2016-08-13 MED ORDER — BUDESONIDE-FORMOTEROL FUMARATE 160-4.5 MCG/ACT IN AERO: 2.0000 | INHALATION_SPRAY | Freq: Two times a day (BID) | RESPIRATORY_TRACT | 12 refills | Status: DC

## 2016-08-13 MED ORDER — TIOTROPIUM BROMIDE MONOHYDRATE 18 MCG IN CAPS: 18.0000 ug | ORAL_CAPSULE | Freq: Every day | RESPIRATORY_TRACT | 0 refills | Status: DC

## 2016-08-13 MED ORDER — DOXYCYCLINE HYCLATE 100 MG PO CAPS: 100.0000 mg | ORAL_CAPSULE | Freq: Two times a day (BID) | ORAL | 0 refills | Status: DC

## 2016-08-13 MED ORDER — ALBUTEROL SULFATE (2.5 MG/3ML) 0.083% IN NEBU: 3.0000 mL | INHALATION_SOLUTION | RESPIRATORY_TRACT | 0 refills | Status: DC | PRN

## 2016-08-13 MED ORDER — ALBUTEROL SULFATE HFA 108 (90 BASE) MCG/ACT IN AERS: 2.0000 | INHALATION_SPRAY | RESPIRATORY_TRACT | 0 refills | Status: DC | PRN

## 2016-08-13 MED ORDER — PREDNISONE 20 MG PO TABS: ORAL_TABLET | ORAL | 0 refills | Status: DC

## 2016-08-13 NOTE — ED Notes (Signed)
Patient is alert and oriented x3.  He was given DC instructions and follow up visit instructions.  Patient gave verbal understanding.  He was DC ambulatory under his own power to home.  V/S stable.  He was not showing any signs of distress on DC 

## 2016-09-19 ENCOUNTER — Emergency Department (HOSPITAL_COMMUNITY): Payer: Medicare Other

## 2016-09-19 ENCOUNTER — Encounter (HOSPITAL_COMMUNITY): Payer: Self-pay

## 2016-09-19 ENCOUNTER — Emergency Department (HOSPITAL_COMMUNITY)
Admission: EM | Admit: 2016-09-19 | Discharge: 2016-09-19 | Disposition: A | Discharge status: Home / Self Care | Payer: Medicare Other | Attending: Emergency Medicine | Admitting: Emergency Medicine

## 2016-09-19 DIAGNOSIS — J441 Chronic obstructive pulmonary disease with (acute) exacerbation: Secondary | ICD-10-CM | POA: Insufficient documentation

## 2016-09-19 DIAGNOSIS — I5022 Chronic systolic (congestive) heart failure: Secondary | ICD-10-CM | POA: Insufficient documentation

## 2016-09-19 DIAGNOSIS — R0602 Shortness of breath: Secondary | ICD-10-CM | POA: Diagnosis present

## 2016-09-19 DIAGNOSIS — I110 Hypertensive heart disease with heart failure: Secondary | ICD-10-CM | POA: Diagnosis not present

## 2016-09-19 DIAGNOSIS — Z7982 Long term (current) use of aspirin: Secondary | ICD-10-CM | POA: Diagnosis not present

## 2016-09-19 DIAGNOSIS — I2510 Atherosclerotic heart disease of native coronary artery without angina pectoris: Secondary | ICD-10-CM | POA: Diagnosis not present

## 2016-09-19 DIAGNOSIS — Z955 Presence of coronary angioplasty implant and graft: Secondary | ICD-10-CM | POA: Diagnosis not present

## 2016-09-19 DIAGNOSIS — R0682 Tachypnea, not elsewhere classified: Secondary | ICD-10-CM | POA: Diagnosis not present

## 2016-09-19 DIAGNOSIS — F17210 Nicotine dependence, cigarettes, uncomplicated: Secondary | ICD-10-CM | POA: Diagnosis not present

## 2016-09-19 DIAGNOSIS — R0603 Acute respiratory distress: Secondary | ICD-10-CM | POA: Diagnosis not present

## 2016-09-19 LAB — BASIC METABOLIC PANEL
Anion gap: 11 (ref 5–15)
BUN: 22 mg/dL — AB (ref 6–20)
CHLORIDE: 100 mmol/L — AB (ref 101–111)
CO2: 23 mmol/L (ref 22–32)
Calcium: 8.1 mg/dL — ABNORMAL LOW (ref 8.9–10.3)
Creatinine, Ser: 0.77 mg/dL (ref 0.61–1.24)
GFR calc Af Amer: >60 mL/min (ref >60)
GFR calc non Af Amer: >60 mL/min (ref >60)
GLUCOSE: 109 mg/dL — AB (ref 65–99)
POTASSIUM: 4.1 mmol/L (ref 3.5–5.1)
Sodium: 134 mmol/L — ABNORMAL LOW (ref 135–145)

## 2016-09-19 LAB — CBC WITH DIFFERENTIAL/PLATELET
Basophils Absolute: 0 10*3/uL (ref 0.0–0.1)
Basophils Relative: 0 %
EOS PCT: 0 %
Eosinophils Absolute: 0 10*3/uL (ref 0.0–0.7)
HCT: 35.4 % — ABNORMAL LOW (ref 39.0–52.0)
Hemoglobin: 11.9 g/dL — ABNORMAL LOW (ref 13.0–17.0)
LYMPHS ABS: 0.3 10*3/uL — AB (ref 0.7–4.0)
LYMPHS PCT: 3 %
MCH: 31.4 pg (ref 26.0–34.0)
MCHC: 33.6 g/dL (ref 30.0–36.0)
MCV: 93.4 fL (ref 78.0–100.0)
MONO ABS: 0.1 10*3/uL (ref 0.1–1.0)
Monocytes Relative: 1 %
Neutro Abs: 8.7 10*3/uL — ABNORMAL HIGH (ref 1.7–7.7)
Neutrophils Relative %: 96 %
PLATELETS: 305 10*3/uL (ref 150–400)
RBC: 3.79 MIL/uL — ABNORMAL LOW (ref 4.22–5.81)
RDW: 16.1 % — AB (ref 11.5–15.5)
WBC: 9.1 10*3/uL (ref 4.0–10.5)

## 2016-09-19 LAB — I-STAT TROPONIN, ED: Troponin i, poc: 0.01 ng/mL (ref 0.00–0.08)

## 2016-09-19 MED ORDER — DOXYCYCLINE HYCLATE 100 MG PO CAPS: 100.0000 mg | ORAL_CAPSULE | Freq: Two times a day (BID) | ORAL | 0 refills | Status: DC

## 2016-09-19 MED ORDER — ALBUTEROL SULFATE HFA 108 (90 BASE) MCG/ACT IN AERS
2.0000 | INHALATION_SPRAY | Freq: Once | RESPIRATORY_TRACT | Status: AC
  Administered 2016-09-19: 2 via RESPIRATORY_TRACT
  Filled 2016-09-19: qty 6.7

## 2016-09-19 MED ORDER — IPRATROPIUM-ALBUTEROL 0.5-2.5 (3) MG/3ML IN SOLN
3.0000 mL | Freq: Once | RESPIRATORY_TRACT | Status: AC
  Administered 2016-09-19: 3 mL via RESPIRATORY_TRACT
  Filled 2016-09-19: qty 3

## 2016-09-19 MED ORDER — ALBUTEROL SULFATE HFA 108 (90 BASE) MCG/ACT IN AERS: 2.0000 | INHALATION_SPRAY | RESPIRATORY_TRACT | 0 refills | Status: DC | PRN

## 2016-09-19 MED ORDER — DOXYCYCLINE HYCLATE 100 MG PO TABS
100.0000 mg | ORAL_TABLET | Freq: Once | ORAL | Status: AC
  Administered 2016-09-19: 100 mg via ORAL
  Filled 2016-09-19: qty 1

## 2016-09-19 MED ORDER — BUDESONIDE-FORMOTEROL FUMARATE 160-4.5 MCG/ACT IN AERO: 2.0000 | INHALATION_SPRAY | Freq: Two times a day (BID) | RESPIRATORY_TRACT | 0 refills | Status: DC

## 2016-09-19 MED ORDER — PREDNISONE 20 MG PO TABS
40.0000 mg | ORAL_TABLET | Freq: Once | ORAL | Status: AC
  Administered 2016-09-19: 40 mg via ORAL
  Filled 2016-09-19: qty 2

## 2016-09-19 MED ORDER — TIOTROPIUM BROMIDE MONOHYDRATE 18 MCG IN CAPS
18.0000 ug | ORAL_CAPSULE | Freq: Every day | RESPIRATORY_TRACT | 0 refills | Status: DC
Start: 2016-09-19 — End: 2017-04-07

## 2016-09-19 MED ORDER — PREDNISONE 10 MG PO TABS: 40.0000 mg | ORAL_TABLET | Freq: Every day | ORAL | 0 refills | Status: DC

## 2016-09-19 NOTE — ED Provider Notes (Signed)
[facility] DEPT Provider Note   CSN: [id number] Arrival date & time: 09/19/16  [id number]   By signing my name below, I, Anthony Bolton, attest that this documentation has been prepared under the direction and in the presence of No att. providers found. Electronically signed, Anthony Bolton, ED Scribe. 09/19/16. 10:56 PM.   History   Chief Complaint Chief Complaint  Patient presents with  . Shortness of Breath   The history is provided by the patient and medical records. No language interpreter was used.    HPI Comments: Anthony Bolton is a 65 y.o. male with chronic COPD brought in by EMS who presents to the Emergency Department complaining of acute respiratory distress. Pt reports associated SOB x 4 days, diaphoresis, fatigue, dry cough and leg swelling. He further states that he has run out of his prescribed COPD medications x 1 week (Symbicort albuterol Spiriva). He is hard of hearing, which limits hx.   Past Medical History:  Diagnosis Date  . CHF (congestive heart failure) (HCC) EF 20 - 25% in 2015  . COPD (chronic obstructive pulmonary disease) ([place])   . HLD (hyperlipidemia)   . HOH (hard of hearing)   . Migraines     Patient Active Problem List   Diagnosis Date Noted  . Transaminasemia 12/03/2015  . Dehydration   . High anion gap metabolic acidosis 123456  . Malnutrition due to starvation ([place]) 12/02/2015  . Leukocytosis 12/02/2015  . Abdominal pain 10/12/2015  . Nausea & vomiting 10/12/2015  . Chest wall pain 10/12/2015  . AKI (acute kidney injury) ([place]) 10/12/2015  . SBO (small bowel obstruction) 10/12/2015  . Acute respiratory failure ([place]) 08/11/2015  . Hard of hearing 08/11/2015  . CAD (nonobstructive per cath 2015) 08/11/2015  . HTN (hypertension) 08/11/2015  . HLD (hyperlipidemia)   . Malnutrition of moderate degree ([place]) 03/02/2015  . COPD exacerbation ([place]) 03/01/2015  . Chronic systolic congestive heart failure, NYHA class 3 ([place]) 03/01/2015  .  Hyperthyroidism 11/06/2013  . Mass of adrenal gland ([place]) 11/04/2013  . Tobacco abuse 11/04/2013  . Hyponatremia 10/31/2013  . COPD (chronic obstructive pulmonary disease) ([place]) 10/31/2013    Past Surgical History:  Procedure Laterality Date  . APPENDECTOMY    . cardiac stents     No stent history  . LEFT HEART CATHETERIZATION WITH CORONARY ANGIOGRAM N/A 11/07/2013   Procedure: LEFT HEART CATHETERIZATION WITH CORONARY ANGIOGRAM;  Surgeon: Clent Demark, MD;  Location: [facility] CATH LAB;  Service: Cardiovascular;  Laterality: N/A;       Home Medications    Prior to Admission medications   Medication Sig Start Date End Date Taking? Authorizing Provider  albuterol (PROVENTIL) (2.5 MG/3ML) 0.083% nebulizer solution Inhale 3 mLs into the lungs every 4 (four) hours as needed for wheezing. 08/13/16  Yes Orpah Greek, MD  albuterol (PROVENTIL HFA;VENTOLIN HFA) 108 (90 Base) MCG/ACT inhaler Inhale 2 puffs into the lungs every 4 (four) hours as needed for wheezing or shortness of breath. 09/19/16   Quintella Reichert, MD  aspirin 81 MG EC tablet Take 1 tablet (81 mg total) by mouth daily. Patient not taking: Reported on 08/13/2016 12/07/15   Modena Jansky, MD  budesonide-formoterol [facility]) 160-4.5 MCG/ACT inhaler Inhale 2 puffs into the lungs 2 (two) times daily. 09/19/16   Quintella Reichert, MD  doxycycline (VIBRAMYCIN) 100 MG capsule Take 1 capsule (100 mg total) by mouth 2 (two) times daily. 09/19/16   Quintella Reichert, MD  Multiple Vitamin (MULTIVITAMIN WITH MINERALS) TABS tablet Take  1 tablet by mouth daily. Patient not taking: Reported on 08/13/2016 12/07/15   Modena Jansky, MD  polyethylene glycol (MIRALAX / GLYCOLAX) packet Take 17 g by mouth daily. Patient not taking: Reported on 08/13/2016 12/07/15   Modena Jansky, MD  predniSONE (DELTASONE) 10 MG tablet Take 4 tablets (40 mg total) by mouth daily. 09/19/16   Quintella Reichert, MD  senna (SENOKOT) 8.6 MG TABS tablet Take 2 tablets  (17.2 mg total) by mouth daily as needed for mild constipation or moderate constipation. Patient not taking: Reported on 08/13/2016 12/07/15   Modena Jansky, MD  tiotropium (SPIRIVA) 18 MCG inhalation capsule Place 1 capsule (18 mcg total) into inhaler and inhale daily. 09/19/16   Quintella Reichert, MD    Family History Family History  Problem Relation Age of Onset  . Stroke Mother     Social History Social History  Substance Use Topics  . Smoking status: Current Every Day Smoker    Packs/day: 0.50    Types: Cigarettes  . Smokeless tobacco: Never Used  . Alcohol use No     Allergies   Patient has no known allergies.   Review of Systems Review of Systems  All other systems reviewed and are negative.  A complete 10 system review of systems was obtained and all systems are negative except as noted in the HPI and PMH.    Physical Exam Updated Vital Signs BP 97/63 (BP Location: Right Arm)   Pulse 93   Temp 98.2 F (36.8 C) (Oral)   Resp 17   SpO2 93%   Physical Exam  Constitutional: He is oriented to person, place, and time. He appears well-developed.  thin  HENT:  Head: Normocephalic and atraumatic.  Cardiovascular: Normal rate and regular rhythm.   No murmur heard. Pulmonary/Chest: Effort normal. No respiratory distress.  Occasional end expiratory wheeze  Abdominal: Soft. There is no tenderness. There is no rebound and no guarding.  Musculoskeletal: He exhibits no edema or tenderness.  Neurological: He is alert and oriented to person, place, and time.  Skin: Skin is warm and dry.  Psychiatric: He has a normal mood and affect. His behavior is normal.  Nursing note and vitals reviewed.    ED Treatments / Results  DIAGNOSTIC STUDIES: Oxygen Saturation is 99% on RA, normal by my interpretation.    COORDINATION OF CARE: 10:56 PM Discussed treatment plan with pt at bedside and pt agreed to plan.  Labs (all labs ordered are listed, but only abnormal results are  displayed) Labs Reviewed  BASIC METABOLIC PANEL - Abnormal; Notable for the following:       Result Value   Sodium 134 (*)    Chloride 100 (*)    Glucose, Bld 109 (*)    BUN 22 (*)    Calcium 8.1 (*)    All other components within normal limits  CBC WITH DIFFERENTIAL/PLATELET - Abnormal; Notable for the following:    RBC 3.79 (*)    Hemoglobin 11.9 (*)    HCT 35.4 (*)    RDW 16.1 (*)    Neutro Abs 8.7 (*)    Lymphs Abs 0.3 (*)    All other components within normal limits  I-STAT TROPOININ, ED    EKG  EKG Interpretation  Date/Time:  Saturday September 19 2016 05:47:55 EST Ventricular Rate:  94 PR Interval:    QRS Duration: 87 QT Interval:  351 QTC Calculation: 439 R Axis:   39 Text Interpretation:  Sinus rhythm Probable anteroseptal  infarct, old Confirmed by Hazle Coca [phone]) on 09/19/2016 6:35:43 AM Also confirmed by Hazle Coca [phone]), editor [place]  CCT, [place] ([id number])  on 09/19/2016 8:20:25 AM       Radiology Dg Chest 2 View  Result Date: 09/19/2016 CLINICAL DATA:  Respiratory distress EXAM: CHEST  2 VIEW COMPARISON:  Chest radiograph 08/13/2016 FINDINGS: The lungs are hyperinflated. Cardiomediastinal contours are normal. No pleural effusion or pneumothorax. No pulmonary edema. There is mildly increased airspace opacity in the right lower lobe. IMPRESSION: Increased airspace opacity in the right lower lobe may indicate developing consolidation, including the possibility of pneumonia. Electronically Signed   By: Ulyses Jarred M.D.   On: 09/19/2016 04:46    Procedures Procedures (including critical care time)  Medications Ordered in ED Medications  ipratropium-albuterol (DUONEB) 0.5-2.5 (3) MG/3ML nebulizer solution 3 mL (3 mLs Nebulization Given 09/19/16 0510)  predniSONE (DELTASONE) tablet 40 mg (40 mg Oral Given 09/19/16 0509)  doxycycline (VIBRA-TABS) tablet 100 mg (100 mg Oral Given 09/19/16 0542)  albuterol (PROVENTIL HFA;VENTOLIN HFA) 108 (90 Base)  MCG/ACT inhaler 2 puff (2 puffs Inhalation Given 09/19/16 0701)     Initial Impression / Assessment and Plan / ED Course  I have reviewed the triage vital signs and the nursing notes.  Pertinent labs & imaging results that were available during my care of the patient were reviewed by me and considered in my medical decision making (see chart for details).  Clinical Course     Patient with history of COPD here with increased shortness of breath and cough after running out of his meds one week ago. He has occasional end expiratory wheezes on examination. No respiratory distress. On repeat assessment following up. All nebulizer treatment he reports feeling improved with improved air movement bilaterally. We will treat for COPD exacerbation with steroids, continued albuterol treatments at home. Chest x-ray with possible developing infiltrate, will treat for possible pneumonia. Discussed with patient home care, outpatient follow-up and return precautions.  Final Clinical Impressions(s) / ED Diagnoses   Final diagnoses:  COPD exacerbation [facility])    New Prescriptions Discharge Medication List as of 09/19/2016  6:47 AM    I personally performed the services described in this documentation, which was scribed in my presence. The recorded information has been reviewed and is accurate.    Quintella Reichert, MD 09/19/16 2257

## 2016-09-19 NOTE — ED Notes (Addendum)
Attempted lab draw x 2 but unsuccessful.RN,Lilibeth made aware.

## 2016-09-19 NOTE — ED Notes (Signed)
Pt called EMS for respiratory distress, pt is hard of hearing, hx of COPD, EMS gave 10mg  albuterol, 1 mg atrovent, and solumedrol.

## 2016-09-19 NOTE — ED Notes (Signed)
Pt. From [place]  , [address], [place].

## 2016-09-28 DIAGNOSIS — E43 Unspecified severe protein-calorie malnutrition: Secondary | ICD-10-CM

## 2016-09-28 DIAGNOSIS — D696 Thrombocytopenia, unspecified: Secondary | ICD-10-CM

## 2016-09-28 DIAGNOSIS — D649 Anemia, unspecified: Secondary | ICD-10-CM

## 2016-09-28 DIAGNOSIS — N400 Enlarged prostate without lower urinary tract symptoms: Secondary | ICD-10-CM

## 2016-09-28 HISTORY — DX: Anemia, unspecified: D64.9

## 2016-09-28 HISTORY — DX: Unspecified severe protein-calorie malnutrition: E43

## 2016-09-28 HISTORY — DX: Thrombocytopenia, unspecified: D69.6

## 2016-09-28 HISTORY — DX: Benign prostatic hyperplasia without lower urinary tract symptoms: N40.0

## 2016-10-06 ENCOUNTER — Emergency Department (HOSPITAL_COMMUNITY): Payer: Medicare Other

## 2016-10-06 ENCOUNTER — Encounter (HOSPITAL_COMMUNITY): Payer: Self-pay | Admitting: *Deleted

## 2016-10-06 ENCOUNTER — Observation Stay (HOSPITAL_COMMUNITY)
Admission: EM | Admit: 2016-10-06 | Discharge: 2016-10-07 | Disposition: A | Discharge status: Home / Self Care | Payer: Medicare Other | Attending: Internal Medicine | Admitting: Internal Medicine

## 2016-10-06 DIAGNOSIS — Z681 Body mass index (BMI) 19 or less, adult: Secondary | ICD-10-CM | POA: Diagnosis not present

## 2016-10-06 DIAGNOSIS — F17210 Nicotine dependence, cigarettes, uncomplicated: Secondary | ICD-10-CM | POA: Diagnosis not present

## 2016-10-06 DIAGNOSIS — R918 Other nonspecific abnormal finding of lung field: Secondary | ICD-10-CM | POA: Diagnosis not present

## 2016-10-06 DIAGNOSIS — E785 Hyperlipidemia, unspecified: Secondary | ICD-10-CM | POA: Diagnosis not present

## 2016-10-06 DIAGNOSIS — Z720 Tobacco use: Secondary | ICD-10-CM | POA: Diagnosis present

## 2016-10-06 DIAGNOSIS — J441 Chronic obstructive pulmonary disease with (acute) exacerbation: Secondary | ICD-10-CM | POA: Diagnosis not present

## 2016-10-06 DIAGNOSIS — R0682 Tachypnea, not elsewhere classified: Secondary | ICD-10-CM | POA: Diagnosis not present

## 2016-10-06 DIAGNOSIS — J9601 Acute respiratory failure with hypoxia: Secondary | ICD-10-CM | POA: Diagnosis not present

## 2016-10-06 DIAGNOSIS — I110 Hypertensive heart disease with heart failure: Secondary | ICD-10-CM | POA: Diagnosis not present

## 2016-10-06 DIAGNOSIS — I2510 Atherosclerotic heart disease of native coronary artery without angina pectoris: Secondary | ICD-10-CM | POA: Diagnosis not present

## 2016-10-06 DIAGNOSIS — J438 Other emphysema: Secondary | ICD-10-CM

## 2016-10-06 DIAGNOSIS — E43 Unspecified severe protein-calorie malnutrition: Secondary | ICD-10-CM | POA: Insufficient documentation

## 2016-10-06 DIAGNOSIS — I5042 Chronic combined systolic (congestive) and diastolic (congestive) heart failure: Secondary | ICD-10-CM | POA: Diagnosis not present

## 2016-10-06 DIAGNOSIS — H9190 Unspecified hearing loss, unspecified ear: Secondary | ICD-10-CM | POA: Insufficient documentation

## 2016-10-06 DIAGNOSIS — R0789 Other chest pain: Secondary | ICD-10-CM | POA: Diagnosis not present

## 2016-10-06 DIAGNOSIS — Z955 Presence of coronary angioplasty implant and graft: Secondary | ICD-10-CM | POA: Diagnosis not present

## 2016-10-06 LAB — BASIC METABOLIC PANEL
ANION GAP: 9 (ref 5–15)
BUN: 20 mg/dL (ref 6–20)
CO2: 28 mmol/L (ref 22–32)
Calcium: 9 mg/dL (ref 8.9–10.3)
Chloride: 101 mmol/L (ref 101–111)
Creatinine, Ser: 0.66 mg/dL (ref 0.61–1.24)
GLUCOSE: 107 mg/dL — AB (ref 65–99)
Potassium: 4.1 mmol/L (ref 3.5–5.1)
Sodium: 138 mmol/L (ref 135–145)

## 2016-10-06 LAB — CBC
HEMATOCRIT: 40.7 % (ref 39.0–52.0)
Hemoglobin: 13.3 g/dL (ref 13.0–17.0)
MCH: 30.9 pg (ref 26.0–34.0)
MCHC: 32.7 g/dL (ref 30.0–36.0)
MCV: 94.4 fL (ref 78.0–100.0)
Platelets: 271 10*3/uL (ref 150–400)
RBC: 4.31 MIL/uL (ref 4.22–5.81)
RDW: 14.7 % (ref 11.5–15.5)
WBC: 9.7 10*3/uL (ref 4.0–10.5)

## 2016-10-06 MED ORDER — IPRATROPIUM BROMIDE 0.02 % IN SOLN
1.0000 mg | Freq: Once | RESPIRATORY_TRACT | Status: AC
  Administered 2016-10-06: 1 mg via RESPIRATORY_TRACT
  Filled 2016-10-06: qty 5

## 2016-10-06 MED ORDER — METHYLPREDNISOLONE SODIUM SUCC 125 MG IJ SOLR
125.0000 mg | Freq: Once | INTRAMUSCULAR | Status: AC
  Administered 2016-10-06: 125 mg via INTRAVENOUS
  Filled 2016-10-06: qty 2

## 2016-10-06 MED ORDER — IPRATROPIUM-ALBUTEROL 0.5-2.5 (3) MG/3ML IN SOLN
3.0000 mL | Freq: Once | RESPIRATORY_TRACT | Status: AC
  Administered 2016-10-06: 3 mL via RESPIRATORY_TRACT
  Filled 2016-10-06: qty 3

## 2016-10-06 MED ORDER — SODIUM CHLORIDE 0.9 % IV BOLUS (SEPSIS)
500.0000 mL | Freq: Once | INTRAVENOUS | Status: AC
  Administered 2016-10-06: 500 mL via INTRAVENOUS

## 2016-10-06 MED ORDER — ALBUTEROL SULFATE (2.5 MG/3ML) 0.083% IN NEBU
5.0000 mg | INHALATION_SOLUTION | Freq: Once | RESPIRATORY_TRACT | Status: DC
  Filled 2016-10-06: qty 6

## 2016-10-06 MED ORDER — ALBUTEROL (5 MG/ML) CONTINUOUS INHALATION SOLN
15.0000 mg/h | INHALATION_SOLUTION | RESPIRATORY_TRACT | Status: DC
  Administered 2016-10-06: 15 mg/h via RESPIRATORY_TRACT
  Filled 2016-10-06: qty 20

## 2016-10-06 MED ORDER — DOXYCYCLINE HYCLATE 100 MG IV SOLR
100.0000 mg | Freq: Once | INTRAVENOUS | Status: AC
  Administered 2016-10-06: 100 mg via INTRAVENOUS
  Filled 2016-10-06: qty 100

## 2016-10-06 NOTE — ED Triage Notes (Signed)
Per EMS, pt from home, lives alone, reports resp distress, has hx of COPD. Does not use home O2.  Lives at an [facility] stay [facility].

## 2016-10-06 NOTE — ED Notes (Addendum)
Pt demanding to go home. He has removed all cardiac leads and his IV. When told by staff he has not been released by the physician pt stated "who's going to stop me?".

## 2016-10-06 NOTE — ED Notes (Signed)
Pt demands left bedrail down

## 2016-10-06 NOTE — ED Notes (Signed)
Pt only able to ambulate a few feet before saying "ok that's it". Pt O2 sat only reading up to 84% while ambulating. Pt returned to bed and put back on 2L O2.

## 2016-10-06 NOTE — ED Provider Notes (Signed)
[facility] DEPT [facility] Provider Note   CSN: [id number] Arrival date & time: 10/06/16  [id number]     History   Chief Complaint Chief Complaint  Patient presents with  . Respiratory Distress    HPI Anthony Bolton is a 66 y.o. male.   Shortness of Breath  This is a recurrent problem. The problem occurs continuously.The current episode started yesterday. The problem has been gradually worsening. Associated symptoms include a fever, cough, sputum production and wheezing. It is unknown what precipitated the problem. Risk factors include smoking. He has tried ipratropium inhalers and beta-agonist inhalers for the symptoms. The treatment provided mild relief. He has had prior hospitalizations. He has had prior ED visits. He has had no prior ICU admissions. Associated medical issues include COPD and CAD.    Past Medical History:  Diagnosis Date  . CHF (congestive heart failure) (HCC) EF 20 - 25% in 2015  . COPD (chronic obstructive pulmonary disease) ([place])   . HLD (hyperlipidemia)   . HOH (hard of hearing)   . Migraines     Patient Active Problem List   Diagnosis Date Noted  . Protein-calorie malnutrition, severe 10/07/2016  . Acute respiratory failure with hypoxia ([place]) 10/07/2016  . Other emphysema ([place]) 10/07/2016  . Transaminasemia 12/03/2015  . Dehydration   . High anion gap metabolic acidosis 123456  . Malnutrition due to starvation ([place]) 12/02/2015  . Leukocytosis 12/02/2015  . Abdominal pain 10/12/2015  . Nausea & vomiting 10/12/2015  . Chest wall pain 10/12/2015  . AKI (acute kidney injury) ([place]) 10/12/2015  . SBO (small bowel obstruction) 10/12/2015  . Acute respiratory failure ([place]) 08/11/2015  . Hard of hearing 08/11/2015  . CAD (nonobstructive per cath 2015) 08/11/2015  . HTN (hypertension) 08/11/2015  . HLD (hyperlipidemia)   . Malnutrition of moderate degree ([place]) 03/02/2015  . COPD exacerbation ([place]) 03/01/2015  . Chronic systolic congestive heart  failure, NYHA class 3 ([place]) 03/01/2015  . Hyperthyroidism 11/06/2013  . Mass of adrenal gland ([place]) 11/04/2013  . Tobacco abuse 11/04/2013  . Hyponatremia 10/31/2013  . COPD (chronic obstructive pulmonary disease) ([place]) 10/31/2013    Past Surgical History:  Procedure Laterality Date  . APPENDECTOMY    . cardiac stents     No stent history  . LEFT HEART CATHETERIZATION WITH CORONARY ANGIOGRAM N/A 11/07/2013   Procedure: LEFT HEART CATHETERIZATION WITH CORONARY ANGIOGRAM;  Surgeon: Clent Demark, MD;  Location: [facility] CATH LAB;  Service: Cardiovascular;  Laterality: N/A;       Home Medications    Prior to Admission medications   Medication Sig Start Date End Date Taking? Authorizing Provider  albuterol (PROVENTIL HFA;VENTOLIN HFA) 108 (90 Base) MCG/ACT inhaler Inhale 2 puffs into the lungs every 4 (four) hours as needed for wheezing or shortness of breath. 09/19/16   Quintella Reichert, MD  budesonide-formoterol [facility]) 160-4.5 MCG/ACT inhaler Inhale 2 puffs into the lungs 2 (two) times daily. 09/19/16   Quintella Reichert, MD  doxycycline (VIBRA-TABS) 100 MG tablet Take 1 tablet (100 mg total) by mouth 2 (two) times daily with a meal. 10/07/16   Orson Eva, MD  feeding supplement, ENSURE ENLIVE, (ENSURE ENLIVE) LIQD Take 237 mLs by mouth 3 (three) times daily between meals. 10/07/16   Orson Eva, MD  ipratropium-albuterol (DUONEB) 0.5-2.5 (3) MG/3ML SOLN Take 3 mLs by nebulization every 6 (six) hours as needed (wheezing, shortness of breath). 10/07/16   Orson Eva, MD  predniSONE (DELTASONE) 10 MG tablet Take 5 tablets (50 mg total) by mouth  daily with breakfast. And decrease by one tablet daily 10/09/16   Orson Eva, MD  tiotropium (SPIRIVA) 18 MCG inhalation capsule Place 1 capsule (18 mcg total) into inhaler and inhale daily. 09/19/16   Quintella Reichert, MD    Family History Family History  Problem Relation Age of Onset  . Stroke Mother     Social History Social History  Substance Use  Topics  . Smoking status: Current Every Day Smoker    Packs/day: 0.50    Types: Cigarettes  . Smokeless tobacco: Never Used  . Alcohol use No     Allergies   Patient has no known allergies.   Review of Systems Review of Systems  Constitutional: Positive for fever.  Respiratory: Positive for cough, sputum production, shortness of breath and wheezing.   All other systems reviewed and are negative.    Physical Exam Updated Vital Signs BP (!) 152/68   Pulse 85   Temp 98.3 F (36.8 C) (Oral)   Resp 17   Ht 5\' 9"  (1.753 m)   Wt 110 lb (49.9 kg)   SpO2 97%   BMI 16.24 kg/m   Physical Exam  Constitutional: He is oriented to person, place, and time. He appears well-developed and well-nourished.  HENT:  Head: Normocephalic and atraumatic.  Eyes: Conjunctivae and EOM are normal.  Neck: Normal range of motion.  Cardiovascular: Regular rhythm.  Tachycardia present.  Exam reveals no friction rub.   No murmur heard. Pulmonary/Chest: Effort normal. Tachypnea noted. No respiratory distress. He has decreased breath sounds. He has wheezes.  Abdominal: Soft. He exhibits no distension. There is no tenderness.  Musculoskeletal: Normal range of motion. He exhibits no edema or deformity.  Neurological: He is alert and oriented to person, place, and time. No cranial nerve deficit.  Skin: Skin is warm and dry.  Nursing note and vitals reviewed.    ED Treatments / Results  Labs (all labs ordered are listed, but only abnormal results are displayed) Labs Reviewed  BASIC METABOLIC PANEL - Abnormal; Notable for the following:       Result Value   Glucose, Bld 107 (*)    All other components within normal limits  MRSA PCR SCREENING  CBC    EKG  EKG Interpretation  Date/Time:  Tuesday October 06 2016 16:12:29 EST Ventricular Rate:  103 PR Interval:    QRS Duration: 90 QT Interval:  342 QTC Calculation: 448 R Axis:   74 Text Interpretation:  Sinus tachycardia Biatrial  enlargement No significant change since last tracing Confirmed by [facility] MD, Corene Cornea [phone]) on 10/06/2016 5:21:20 PM       Radiology Dg Chest Portable 1 View  Result Date: 10/06/2016 CLINICAL DATA:  Respiratory distress EXAM: PORTABLE CHEST 1 VIEW COMPARISON:  09/19/2016 FINDINGS: The lungs are hyperinflated. There is no focal infiltrate, consolidation or effusion. Stable cardiomediastinal silhouette. No pneumothorax. IMPRESSION: Hyperinflation without focal infiltrate Electronically Signed   By: Donavan Foil M.D.   On: 10/06/2016 17:59    Procedures Procedures (including critical care time)  Medications Ordered in ED Medications  doxycycline (VIBRA-TABS) tablet 100 mg (100 mg Oral Given 10/07/16 1049)  sodium chloride flush (NS) 0.9 % injection 3 mL (3 mLs Intravenous Given 10/07/16 1000)  sodium chloride flush (NS) 0.9 % injection 3 mL (not administered)  0.9 %  sodium chloride infusion (not administered)  acetaminophen (TYLENOL) tablet 650 mg (650 mg Oral Given 10/07/16 1715)    Or  acetaminophen (TYLENOL) suppository 650 mg ( Rectal See Alternative  10/07/16 1715)  ondansetron (ZOFRAN) tablet 4 mg (not administered)    Or  ondansetron (ZOFRAN) injection 4 mg (not administered)  ipratropium-albuterol (DUONEB) 0.5-2.5 (3) MG/3ML nebulizer solution 3 mL (3 mLs Nebulization Given 10/07/16 1554)  feeding supplement (ENSURE ENLIVE) (ENSURE ENLIVE) liquid 237 mL (237 mLs Oral Given 10/07/16 2015)  predniSONE (DELTASONE) tablet 40 mg (not administered)  predniSONE (DELTASONE) tablet 50 mg (not administered)  ipratropium (ATROVENT) nebulizer solution 1 mg (1 mg Nebulization Given 10/06/16 1636)  methylPREDNISolone sodium succinate (SOLU-MEDROL) 125 mg/2 mL injection 125 mg (125 mg Intravenous Given 10/06/16 1636)  doxycycline (VIBRAMYCIN) 100 mg in dextrose 5 % 250 mL IVPB (0 mg Intravenous Stopped 10/06/16 1930)  ipratropium-albuterol (DUONEB) 0.5-2.5 (3) MG/3ML nebulizer solution 3 mL (3 mLs  Nebulization Given 10/06/16 2002)  methylPREDNISolone sodium succinate (SOLU-MEDROL) 125 mg/2 mL injection 125 mg (125 mg Intravenous Given 10/06/16 2226)  sodium chloride 0.9 % bolus 500 mL (0 mLs Intravenous Stopped 10/06/16 2310)     Initial Impression / Assessment and Plan / ED Course  I have reviewed the triage vital signs and the nursing notes.  Pertinent labs & imaging results that were available during my care of the patient were reviewed by me and considered in my medical decision making (see chart for details).  Clinical Course    Likely COPD exacerbation. Will treat appropriately. Some 'tightness' in chest, suspect it is related to coughing and COPD.   Patient likely COPD exacerbation. Hypoxic on minimal ambulation with significant respiratory distress so we will admit for further antibiotics, steroids and breathing treatments.  Final Clinical Impressions(s) / ED Diagnoses   Final diagnoses:  COPD exacerbation ([place])    New Prescriptions Discharge Medication List as of 10/07/2016  4:18 PM    START taking these medications   Details  doxycycline (VIBRA-TABS) 100 MG tablet Take 1 tablet (100 mg total) by mouth 2 (two) times daily with a meal., Starting Wed 10/07/2016, Normal    feeding supplement, ENSURE ENLIVE, (ENSURE ENLIVE) LIQD Take 237 mLs by mouth 3 (three) times daily between meals., Starting Wed 10/07/2016, Print    predniSONE (DELTASONE) 10 MG tablet Take 5 tablets (50 mg total) by mouth daily with breakfast. And decrease by one tablet daily, Starting Fri 10/09/2016, Normal         Merrily Pew, MD 10/08/16 0011

## 2016-10-06 NOTE — ED Notes (Signed)
Bed: [id number] Expected date:  Expected time:  Means of arrival:  Comments: EMS- 65yo M, SOB/Hx of COPD/Neb given

## 2016-10-07 DIAGNOSIS — J441 Chronic obstructive pulmonary disease with (acute) exacerbation: Principal | ICD-10-CM

## 2016-10-07 DIAGNOSIS — J438 Other emphysema: Secondary | ICD-10-CM

## 2016-10-07 DIAGNOSIS — J9601 Acute respiratory failure with hypoxia: Secondary | ICD-10-CM

## 2016-10-07 DIAGNOSIS — Z720 Tobacco use: Secondary | ICD-10-CM | POA: Diagnosis not present

## 2016-10-07 DIAGNOSIS — E43 Unspecified severe protein-calorie malnutrition: Secondary | ICD-10-CM

## 2016-10-07 LAB — MRSA PCR SCREENING: MRSA BY PCR: NEGATIVE

## 2016-10-07 MED ORDER — ONDANSETRON HCL 4 MG/2ML IJ SOLN: 4.0000 mg | Freq: Four times a day (QID) | INTRAMUSCULAR | Status: DC | PRN

## 2016-10-07 MED ORDER — SODIUM CHLORIDE 0.9% FLUSH
3.0000 mL | Freq: Two times a day (BID) | INTRAVENOUS | Status: DC
  Administered 2016-10-07: 10:00:00 3 mL via INTRAVENOUS

## 2016-10-07 MED ORDER — IPRATROPIUM-ALBUTEROL 0.5-2.5 (3) MG/3ML IN SOLN
3.0000 mL | RESPIRATORY_TRACT | Status: DC
  Administered 2016-10-07: 3 mL via RESPIRATORY_TRACT
  Filled 2016-10-07: qty 3

## 2016-10-07 MED ORDER — ENSURE ENLIVE PO LIQD: 237.0000 mL | Freq: Three times a day (TID) | ORAL | 0 refills | Status: DC

## 2016-10-07 MED ORDER — DOXYCYCLINE HYCLATE 100 MG PO TABS
100.0000 mg | ORAL_TABLET | Freq: Two times a day (BID) | ORAL | Status: DC
  Administered 2016-10-07: 100 mg via ORAL
  Filled 2016-10-07 (×3): qty 1

## 2016-10-07 MED ORDER — SODIUM CHLORIDE 0.9% FLUSH: 3.0000 mL | INTRAVENOUS | Status: DC | PRN

## 2016-10-07 MED ORDER — IPRATROPIUM-ALBUTEROL 0.5-2.5 (3) MG/3ML IN SOLN: 3.0000 mL | Freq: Four times a day (QID) | RESPIRATORY_TRACT | 0 refills | Status: DC | PRN

## 2016-10-07 MED ORDER — ACETAMINOPHEN 650 MG RE SUPP: 650.0000 mg | Freq: Four times a day (QID) | RECTAL | Status: DC | PRN

## 2016-10-07 MED ORDER — ENSURE ENLIVE PO LIQD
237.0000 mL | Freq: Three times a day (TID) | ORAL | Status: DC
  Administered 2016-10-07: 237 mL via ORAL

## 2016-10-07 MED ORDER — PREDNISONE 10 MG PO TABS: 50.0000 mg | ORAL_TABLET | Freq: Every day | ORAL | 0 refills | Status: DC

## 2016-10-07 MED ORDER — ACETAMINOPHEN 325 MG PO TABS
650.0000 mg | ORAL_TABLET | Freq: Four times a day (QID) | ORAL | Status: DC | PRN
  Administered 2016-10-07: 650 mg via ORAL
  Filled 2016-10-07: qty 2

## 2016-10-07 MED ORDER — PREDNISONE 5 MG PO TABS: 50.0000 mg | ORAL_TABLET | Freq: Every day | ORAL | Status: DC

## 2016-10-07 MED ORDER — IPRATROPIUM-ALBUTEROL 0.5-2.5 (3) MG/3ML IN SOLN
3.0000 mL | Freq: Four times a day (QID) | RESPIRATORY_TRACT | Status: DC
  Administered 2016-10-07 (×2): 3 mL via RESPIRATORY_TRACT
  Filled 2016-10-07 (×3): qty 3

## 2016-10-07 MED ORDER — ONDANSETRON HCL 4 MG PO TABS
4.0000 mg | ORAL_TABLET | Freq: Four times a day (QID) | ORAL | Status: DC | PRN
Start: 2016-10-07 — End: 2016-10-08

## 2016-10-07 MED ORDER — DOXYCYCLINE HYCLATE 100 MG PO TABS: 100.0000 mg | ORAL_TABLET | Freq: Two times a day (BID) | ORAL | 0 refills | Status: DC

## 2016-10-07 MED ORDER — IPRATROPIUM-ALBUTEROL 0.5-2.5 (3) MG/3ML IN SOLN: 3.0000 mL | Freq: Four times a day (QID) | RESPIRATORY_TRACT | 0 refills | Status: DC

## 2016-10-07 MED ORDER — PREDNISONE 20 MG PO TABS: 40.0000 mg | ORAL_TABLET | Freq: Once | ORAL | Status: DC

## 2016-10-07 MED ORDER — PREDNISONE 20 MG PO TABS
40.0000 mg | ORAL_TABLET | Freq: Every day | ORAL | Status: DC
  Administered 2016-10-07: 10:00:00 40 mg via ORAL
  Filled 2016-10-07: qty 2

## 2016-10-07 MED ORDER — SODIUM CHLORIDE 0.9 % IV SOLN: 250.0000 mL | INTRAVENOUS | Status: DC | PRN

## 2016-10-07 NOTE — Discharge Summary (Signed)
Physician Discharge Summary  Anthony Bolton [id number] DOB: May 15, 1951 DOA: 10/06/2016  PCP: [name], MD  Admit date: 10/06/2016 Discharge date: 10/07/2016  Admitted From: Home Disposition:  Home  Recommendations for Outpatient Follow-up:  1. Follow up with PCP in 1-2 weeks 2. Please obtain BMP/CBC in one week   Home Health:No Equipment/Devices:No  Discharge Condition: Stable CODE STATUS: FULL Diet recommendation:  Regular   Brief/Interim Summary: 66 year old male with a history of COPD, tobacco use, diastolic CHF presented with 2-3 day history of progressive shortness of breath and nonproductive cough. The patient has also been complaining of subjective fevers for 3 days prior to admission. He has been using his rescue inhaler without much improvement. He is compliant with Symbicort and Spiriva. In the emergency department, the patient desaturated with ambulation. He continued to have audible wheezing despite Solu-Medrol, doxycycline, and duo nebs. As a result, admission was requested.  Discharge Diagnoses:  Acute respiratory failure with hypoxia -Secondary to COPD exacerbation -Improved clinically -Stable on room air at the time of discharge -Ambulatory pulse ox on room air did not show desaturation  COPD exacerbation -Continue prednisone taper over the next 5 days -Continue duo nebs at home when necessary shortness of breath -Continue Symbicort and Spiriva -5 more days of doxycycline after discharge  Tobacco abuse -Tobacco cessation discussed  Severe malnutrition -Continue ensure   Discharge Instructions  Discharge Instructions    Diet general    Complete by:  As directed    Increase activity slowly    Complete by:  As directed      Allergies as of 10/07/2016   No Known Allergies     Medication List    TAKE these medications   albuterol 108 (90 Base) MCG/ACT inhaler Commonly known as:  PROVENTIL HFA;VENTOLIN HFA Inhale 2 puffs into the lungs  every 4 (four) hours as needed for wheezing or shortness of breath. What changed:  Another medication with the same name was removed. Continue taking this medication, and follow the directions you see here.   budesonide-formoterol 160-4.5 MCG/ACT inhaler Commonly known as:  SYMBICORT Inhale 2 puffs into the lungs 2 (two) times daily.   doxycycline 100 MG tablet Commonly known as:  VIBRA-TABS Take 1 tablet (100 mg total) by mouth 2 (two) times daily with a meal.   feeding supplement (ENSURE ENLIVE) Liqd Take 237 mLs by mouth 3 (three) times daily between meals.   ipratropium-albuterol 0.5-2.5 (3) MG/3ML Soln Commonly known as:  DUONEB Take 3 mLs by nebulization every 6 (six) hours as needed (wheezing, shortness of breath).   predniSONE 10 MG tablet Commonly known as:  DELTASONE Take 5 tablets (50 mg total) by mouth daily with breakfast. And decrease by one tablet daily Start taking on:  10/09/2016   tiotropium 18 MCG inhalation capsule Commonly known as:  SPIRIVA Place 1 capsule (18 mcg total) into inhaler and inhale daily.       No Known Allergies  Consultations:  none   Procedures/Studies: Dg Chest 2 View  Result Date: 09/19/2016 CLINICAL DATA:  Respiratory distress EXAM: CHEST  2 VIEW COMPARISON:  Chest radiograph 08/13/2016 FINDINGS: The lungs are hyperinflated. Cardiomediastinal contours are normal. No pleural effusion or pneumothorax. No pulmonary edema. There is mildly increased airspace opacity in the right lower lobe. IMPRESSION: Increased airspace opacity in the right lower lobe may indicate developing consolidation, including the possibility of pneumonia. Electronically Signed   By: Ulyses Jarred M.D.   On: 09/19/2016 04:46   Dg Chest Portable  1 View  Result Date: 10/06/2016 CLINICAL DATA:  Respiratory distress EXAM: PORTABLE CHEST 1 VIEW COMPARISON:  09/19/2016 FINDINGS: The lungs are hyperinflated. There is no focal infiltrate, consolidation or effusion. Stable  cardiomediastinal silhouette. No pneumothorax. IMPRESSION: Hyperinflation without focal infiltrate Electronically Signed   By: Donavan Foil M.D.   On: 10/06/2016 17:59        Discharge Exam: Vitals:   10/07/16 0817 10/07/16 0942  BP: (!) 145/99   Pulse: 85 90  Resp: (!) 22 18  Temp: 98.9 F (37.2 C)    Vitals:   10/07/16 0732 10/07/16 0817 10/07/16 0942 10/07/16 1228  BP:  (!) 145/99    Pulse:  85 90   Resp:  (!) 22 18   Temp:  98.9 F (37.2 C)    TempSrc:  Oral    SpO2:  100% 98% 98%  Weight: 49.9 kg (110 lb)     Height: 5\' 9"  (1.753 m)       General: Pt is alert, awake, not in acute distress Cardiovascular: RRR, S1/S2 +, no rubs, no gallops Respiratory: Bibasilar rales with diminished breath sounds bilateral.no rhonchi Abdominal: Soft, NT, ND, bowel sounds + Extremities: no edema, no cyanosis   The results of significant diagnostics from this hospitalization (including imaging, microbiology, ancillary and laboratory) are listed below for reference.    Significant Diagnostic Studies: Dg Chest 2 View  Result Date: 09/19/2016 CLINICAL DATA:  Respiratory distress EXAM: CHEST  2 VIEW COMPARISON:  Chest radiograph 08/13/2016 FINDINGS: The lungs are hyperinflated. Cardiomediastinal contours are normal. No pleural effusion or pneumothorax. No pulmonary edema. There is mildly increased airspace opacity in the right lower lobe. IMPRESSION: Increased airspace opacity in the right lower lobe may indicate developing consolidation, including the possibility of pneumonia. Electronically Signed   By: Ulyses Jarred M.D.   On: 09/19/2016 04:46   Dg Chest Portable 1 View  Result Date: 10/06/2016 CLINICAL DATA:  Respiratory distress EXAM: PORTABLE CHEST 1 VIEW COMPARISON:  09/19/2016 FINDINGS: The lungs are hyperinflated. There is no focal infiltrate, consolidation or effusion. Stable cardiomediastinal silhouette. No pneumothorax. IMPRESSION: Hyperinflation without focal infiltrate  Electronically Signed   By: Donavan Foil M.D.   On: 10/06/2016 17:59     Microbiology: Recent Results (from the past 240 hour(s))  MRSA PCR Screening     Status: None   Collection Time: 10/07/16  9:30 AM  Result Value Ref Range Status   MRSA by PCR NEGATIVE NEGATIVE Final    Comment:        The GeneXpert MRSA Assay (FDA approved for NASAL specimens only), is one component of a comprehensive MRSA colonization surveillance program. It is not intended to diagnose MRSA infection nor to guide or monitor treatment for MRSA infections.      Labs: Basic Metabolic Panel:  Recent Labs Lab 10/06/16 1630  NA 138  K 4.1  CL 101  CO2 28  GLUCOSE 107*  BUN 20  CREATININE 0.66  CALCIUM 9.0   Liver Function Tests: No results for input(s): AST, ALT, ALKPHOS, BILITOT, PROT, ALBUMIN in the last 168 hours. No results for input(s): LIPASE, AMYLASE in the last 168 hours. No results for input(s): AMMONIA in the last 168 hours. CBC:  Recent Labs Lab 10/06/16 1630  WBC 9.7  HGB 13.3  HCT 40.7  MCV 94.4  PLT 271   Cardiac Enzymes: No results for input(s): CKTOTAL, CKMB, CKMBINDEX, TROPONINI in the last 168 hours. BNP: Invalid input(s): POCBNP CBG: No results for input(s): GLUCAP  in the last 168 hours.  Time coordinating discharge:  Greater than 30 minutes  Signed:  Janeya Deyo, DO [facility] Pager: [phone] 10/07/2016, 3:05 PM

## 2016-10-07 NOTE — Progress Notes (Signed)
Initial Nutrition Assessment  DOCUMENTATION CODES:   Severe malnutrition in context of chronic illness  INTERVENTION:  Ensure Enlive po BID, each supplement provides 350 kcal and 20 grams of protein  Magic cup TID with meals, each supplement provides 290 kcal and 9 grams of protein  NUTRITION DIAGNOSIS:   Malnutrition related to catabolic illness as evidenced by severe depletion of body fat, severe depletion of muscle mass.  GOAL:   Patient will meet greater than or equal to 90% of their needs  MONITOR:   PO intake, Supplement acceptance  REASON FOR ASSESSMENT:   Consult Assessment of nutrition requirement/status  ASSESSMENT:    66 y.o. gentleman with a history of COPD (he is not on home oxygen), active tobacco use with dependence ("I have smoked for 52 years; [place]), prior CHF (LV systolic function documented as normal with EF 50-55% in November 2016), and hard of hearing who presents to the ED for evaluation of progressive DOE, dry cough, chest tightness, and subjective fevers for three days prior to admission.   Met with pt in room today. Pt is poor historian and seemed slightly confused. Pt documented eating 100% meals. Per chart, pt is wt stable. Will order Ensure and Magic Cup. Encourage PO intake.   Medications reviewed and include: doxycycline, prednisone  Labs reviewed: wnl   Nutrition-Focused physical exam completed. Findings are severe fat depletion, severe muscle depletion, and no edema.   Diet Order:  Diet regular Room service appropriate? Yes; Fluid consistency: Thin  Skin:  Reviewed, no issues  Last BM:  none since admit   Height:   Ht Readings from Last 1 Encounters:  10/07/16 5' 9"  (1.753 m)    Weight:   Wt Readings from Last 1 Encounters:  10/07/16 110 lb (49.9 kg)    Ideal Body Weight:  72.7 kg  BMI:  Body mass index is 16.24 kg/m.  Estimated Nutritional Needs:   Kcal:  1800-2000kcal/day   Protein:   75-85g/day   Fluid:  >1.8L/day   EDUCATION NEEDS:   No education needs identified at this time  Koleen Distance, RD, LDN Pager #[phone]

## 2016-10-07 NOTE — H&P (Signed)
History and Physical    Anthony Bolton DOB: Aug 24, 1951 DOA: 10/06/2016  PCP: [name], MD   Patient coming from: Home  Chief Complaint: Shortness of breath  HPI: Anthony Bolton is a 66 y.o. gentleman with a history of COPD (he is not on home oxygen), active tobacco use with dependence ("I have smoked for 52 years; [place]), prior CHF (LV systolic function documented as normal with EF 50-55% in November 2016), and hard of hearing who presents to the ED for evaluation of progressive DOE, dry cough, chest tightness, and subjective fevers for three days prior to admission.  No known sick contacts.  No nausea.  No syncope.  He tells me that he uses Symbicort and Spiriva inhalers at baseline.  He has an albuterol rescue inhaler as well.  He thinks that he would benefit from a home nebulizer machine as well as home oxygen.  ED Course: Patient actually maintains O2 sats in the low 90's on room air, but he desats to 84% with exertion on RA.  The patient had audible wheezing upon arrival.   O2 sats 98% on 2L [place].  The patient has received IV solumedrol x 2, doxycycline, and duonebs.  Chest xray shows hyperinflation but no acute infiltrate.  EKG shows a sinus tachycardia.  Clinically, the patient is improved but oxygen requirement is new.  Hospitalist asked to place in observation.  Review of Systems: As per HPI otherwise 10 point review of systems negative.    Past Medical History:  Diagnosis Date  . CHF (congestive heart failure) (HCC) EF 20 - 25% in 2015  . COPD (chronic obstructive pulmonary disease) ([place])   . HLD (hyperlipidemia)   . HOH (hard of hearing)   . Migraines     Past Surgical History:  Procedure Laterality Date  . APPENDECTOMY    . cardiac stents     No stent history  . LEFT HEART CATHETERIZATION WITH CORONARY ANGIOGRAM N/A 11/07/2013   Procedure: LEFT HEART CATHETERIZATION WITH CORONARY ANGIOGRAM;  Surgeon: Clent Demark, MD;   Location: [facility] CATH LAB;  Service: Cardiovascular;  Laterality: N/A;     reports that he has been smoking Cigarettes.  He has been smoking about 0.50 packs per day. He has never used smokeless tobacco. He reports that he does not drink alcohol or use drugs.  He has three sons but is only in contact with one of them.  No Known Allergies  Family History  Problem Relation Age of Onset  . Stroke Mother      Prior to Admission medications   Medication Sig Start Date End Date Taking? Authorizing Provider  albuterol (PROVENTIL HFA;VENTOLIN HFA) 108 (90 Base) MCG/ACT inhaler Inhale 2 puffs into the lungs every 4 (four) hours as needed for wheezing or shortness of breath. 09/19/16   Quintella Reichert, MD  albuterol (PROVENTIL) (2.5 MG/3ML) 0.083% nebulizer solution Inhale 3 mLs into the lungs every 4 (four) hours as needed for wheezing. 08/13/16   Orpah Greek, MD  budesonide-formoterol (SYMBICORT) 160-4.5 MCG/ACT inhaler Inhale 2 puffs into the lungs 2 (two) times daily. 09/19/16   Quintella Reichert, MD  tiotropium (SPIRIVA) 18 MCG inhalation capsule Place 1 capsule (18 mcg total) into inhaler and inhale daily. 09/19/16   Quintella Reichert, MD    Physical Exam: Vitals:   10/06/16 2200 10/06/16 2201 10/06/16 2230 10/06/16 2300  BP: 130/91 130/91 117/86 121/87  Pulse: 91 90 86 81  Resp:  18  Temp:      TempSrc:      SpO2: 98% 98% 97% 98%      Constitutional: NAD, calm, comfortable, NONtoxic but chronically ill appearing Vitals:   10/06/16 2200 10/06/16 2201 10/06/16 2230 10/06/16 2300  BP: 130/91 130/91 117/86 121/87  Pulse: 91 90 86 81  Resp:  18    Temp:      TempSrc:      SpO2: 98% 98% 97% 98%   Eyes: PERRL, lids and conjunctivae normal ENMT: Mucous membranes are moist. Posterior pharynx clear of any exudate or lesions. Edentulous. Neck: normal appearance, supple Respiratory: Bilateral wheezing.  Normal respiratory effort. No accessory muscle use.  Cardiovascular: Normal  rate, regular rhythm, no murmurs / rubs / gallops. No extremity edema. 2+ pedal pulses. GI: abdomen is soft and compressible.  No distention.  No tenderness.  Bowel sounds are present. Musculoskeletal:  No joint deformity in upper and lower extremities. Good ROM, no contractures. Normal muscle tone.  Skin: no rashes, warm and dry Neurologic: CN 2-12 grossly intact. Sensation intact, Strength symmetric bilaterally, 5/5.  Extremely hard of hearing. Psychiatric: Normal judgment and insight. Alert and oriented x 3. Normal mood.     Labs on Admission: I have personally reviewed following labs and imaging studies  CBC:  Recent Labs Lab 10/06/16 1630  WBC 9.7  HGB 13.3  HCT 40.7  MCV 94.4  PLT 99991111   Basic Metabolic Panel:  Recent Labs Lab 10/06/16 1630  NA 138  K 4.1  CL 101  CO2 28  GLUCOSE 107*  BUN 20  CREATININE 0.66  CALCIUM 9.0   GFR: CrCl cannot be calculated (Unknown ideal weight.).   Radiological Exams on Admission: Dg Chest Portable 1 View  Result Date: 10/06/2016 CLINICAL DATA:  Respiratory distress EXAM: PORTABLE CHEST 1 VIEW COMPARISON:  09/19/2016 FINDINGS: The lungs are hyperinflated. There is no focal infiltrate, consolidation or effusion. Stable cardiomediastinal silhouette. No pneumothorax. IMPRESSION: Hyperinflation without focal infiltrate Electronically Signed   By: Donavan Foil M.D.   On: 10/06/2016 17:59    EKG: Independently reviewed. Sinus tachycardia.  Assessment/Plan Principal Problem:   COPD exacerbation (HCC) Active Problems:   Tobacco abuse      COPD exacerbation with acute hypoxia, new O2 requirement --O2 saturations are acceptable on RA at rest; patient needs O2 with exertion --continue prednisone 40mg  daily --Duonebs q4h --Continue empiric doxycycline --Respiratory therapy --COPD gold protocol --Repeat ambulatory O2 sat on RA in the AM; patient may need home oxygen   DVT prophylaxis: Low risk, outpatient status Code Status:  FULL Family Communication: Patient alone in the ED at time of admission. Disposition Plan: Expect he will discharge in the AM Consults called: NONE Admission status: Place in observation, med surg   TIME SPENT: 69 minutes   Eber Jones MD [facility] Pager [phone]  If 7PM-7AM, please contact night-coverage [email or web address] Password [email or web address]  10/07/2016, 2:07 AM

## 2016-10-07 NOTE — ED Notes (Signed)
PT IS HOH

## 2016-10-07 NOTE — Care Management Obs Status (Signed)
[place] NOTIFICATION   Patient Details  Name: Anthony Bolton MRN: [id number] Date of Birth: [birth year]   Medicare Observation Status Notification Given:  Yes    Dellie Catholic, RN 10/07/2016, 12:53 PM

## 2016-10-13 ENCOUNTER — Emergency Department (HOSPITAL_COMMUNITY)
Admission: EM | Admit: 2016-10-13 | Discharge: 2016-10-14 | Disposition: A | Discharge status: Home / Self Care | Payer: Medicare Other | Attending: Emergency Medicine | Admitting: Emergency Medicine

## 2016-10-13 ENCOUNTER — Emergency Department (HOSPITAL_COMMUNITY): Payer: Medicare Other

## 2016-10-13 ENCOUNTER — Encounter (HOSPITAL_COMMUNITY): Payer: Self-pay | Admitting: Emergency Medicine

## 2016-10-13 DIAGNOSIS — I509 Heart failure, unspecified: Secondary | ICD-10-CM | POA: Insufficient documentation

## 2016-10-13 DIAGNOSIS — R0602 Shortness of breath: Secondary | ICD-10-CM | POA: Diagnosis not present

## 2016-10-13 DIAGNOSIS — F17210 Nicotine dependence, cigarettes, uncomplicated: Secondary | ICD-10-CM | POA: Diagnosis not present

## 2016-10-13 DIAGNOSIS — J441 Chronic obstructive pulmonary disease with (acute) exacerbation: Secondary | ICD-10-CM | POA: Diagnosis not present

## 2016-10-13 DIAGNOSIS — I110 Hypertensive heart disease with heart failure: Secondary | ICD-10-CM | POA: Insufficient documentation

## 2016-10-13 DIAGNOSIS — R05 Cough: Secondary | ICD-10-CM | POA: Diagnosis not present

## 2016-10-13 LAB — BLOOD GAS, VENOUS
ACID-BASE DEFICIT: 0.4 mmol/L (ref 0.0–2.0)
Bicarbonate: 26.9 mmol/L (ref 20.0–28.0)
DRAWN BY: 422461
FIO2: 21
O2 SAT: 80.1 %
PATIENT TEMPERATURE: 98.6
pCO2, Ven: 57.5 mmHg (ref 44.0–60.0)
pH, Ven: 7.292 (ref 7.250–7.430)
pO2, Ven: 47.2 mmHg — ABNORMAL HIGH (ref 32.0–45.0)

## 2016-10-13 LAB — BASIC METABOLIC PANEL
ANION GAP: 9 (ref 5–15)
BUN: 22 mg/dL — AB (ref 6–20)
CHLORIDE: 98 mmol/L — AB (ref 101–111)
CO2: 25 mmol/L (ref 22–32)
Calcium: 9 mg/dL (ref 8.9–10.3)
Creatinine, Ser: 0.6 mg/dL — ABNORMAL LOW (ref 0.61–1.24)
GFR calc non Af Amer: >60 mL/min (ref >60)
Glucose, Bld: 83 mg/dL (ref 65–99)
POTASSIUM: 3.5 mmol/L (ref 3.5–5.1)
SODIUM: 132 mmol/L — AB (ref 135–145)

## 2016-10-13 LAB — CBC WITH DIFFERENTIAL/PLATELET
BASOS ABS: 0 10*3/uL (ref 0.0–0.1)
Basophils Relative: 0 %
EOS ABS: 0.4 10*3/uL (ref 0.0–0.7)
Eosinophils Relative: 4 %
HCT: 40.9 % (ref 39.0–52.0)
HEMOGLOBIN: 13.9 g/dL (ref 13.0–17.0)
LYMPHS ABS: 2 10*3/uL (ref 0.7–4.0)
Lymphocytes Relative: 17 %
MCH: 31.7 pg (ref 26.0–34.0)
MCHC: 34 g/dL (ref 30.0–36.0)
MCV: 93.2 fL (ref 78.0–100.0)
Monocytes Absolute: 0.9 10*3/uL (ref 0.1–1.0)
Monocytes Relative: 8 %
NEUTROS PCT: 71 %
Neutro Abs: 8.4 10*3/uL — ABNORMAL HIGH (ref 1.7–7.7)
Platelets: 229 10*3/uL (ref 150–400)
RBC: 4.39 MIL/uL (ref 4.22–5.81)
RDW: 14.3 % (ref 11.5–15.5)
WBC: 11.8 10*3/uL — AB (ref 4.0–10.5)

## 2016-10-13 LAB — I-STAT TROPONIN, ED: TROPONIN I, POC: 0.01 ng/mL (ref 0.00–0.08)

## 2016-10-13 LAB — BRAIN NATRIURETIC PEPTIDE: B NATRIURETIC PEPTIDE 5: 64.5 pg/mL (ref 0.0–100.0)

## 2016-10-13 MED ORDER — IPRATROPIUM BROMIDE 0.02 % IN SOLN
0.5000 mg | Freq: Once | RESPIRATORY_TRACT | Status: AC
  Administered 2016-10-13: 0.5 mg via RESPIRATORY_TRACT
  Filled 2016-10-13: qty 2.5

## 2016-10-13 MED ORDER — DEXAMETHASONE SODIUM PHOSPHATE 10 MG/ML IJ SOLN
10.0000 mg | Freq: Once | INTRAMUSCULAR | Status: AC
  Administered 2016-10-13: 10 mg via INTRAMUSCULAR
  Filled 2016-10-13: qty 1

## 2016-10-13 MED ORDER — ALBUTEROL (5 MG/ML) CONTINUOUS INHALATION SOLN
5.0000 mg/h | INHALATION_SOLUTION | Freq: Once | RESPIRATORY_TRACT | Status: AC
  Administered 2016-10-13: 5 mg/h via RESPIRATORY_TRACT
  Filled 2016-10-13: qty 20

## 2016-10-13 NOTE — ED Provider Notes (Signed)
[facility] DEPT Provider Note   CSN: [id number] Arrival date & time: 10/13/16  1932  By signing my name below, I, Anthony Bolton, attest that this documentation has been prepared under the direction and in the presence of Antonietta Breach, PA-C  Electronically Signed: Ephriam Bolton, ED Scribe. 10/13/16. 8:32 PM.   History   Chief Complaint Chief Complaint  Patient presents with  . Shortness of Breath   HPI HPI Comments: Anthony Bolton is a 66 y.o. male, with Hx of CHF, COPD, brought in by ambulance, who presents to the Emergency Department complaining of gradual onset shortness of breath that started yesterday evening. He also reports a dry cough that has persisted over the past few days. Pt states that his shortness of breath has improved since arriving to the ED after receiving a breathing treatment by EMS. He also reports that his belly "feels swollen". Pt was seen here on 10/06/2016, seven days ago, with an exacerbation of his COPD. He was discharged with prescriptions for doxycycline and prednisone. Pt reports that he did finish both medications as directed. No fever, vomiting, diarrhea.  The history is provided by the patient. No language interpreter was used.    Past Medical History:  Diagnosis Date  . CHF (congestive heart failure) (HCC) EF 20 - 25% in 2015  . COPD (chronic obstructive pulmonary disease) ([place])   . HLD (hyperlipidemia)   . HOH (hard of hearing)   . Migraines     Patient Active Problem List   Diagnosis Date Noted  . Protein-calorie malnutrition, severe 10/07/2016  . Acute respiratory failure with hypoxia ([place] ) 10/07/2016  . Other emphysema ([place]) 10/07/2016  . Transaminasemia 12/03/2015  . Dehydration   . High anion gap metabolic acidosis 123456  . Malnutrition due to starvation ([place]) 12/02/2015  . Leukocytosis 12/02/2015  . Abdominal pain 10/12/2015  . Nausea & vomiting 10/12/2015  . Chest wall pain 10/12/2015  . AKI (acute kidney injury) ([place])  10/12/2015  . SBO (small bowel obstruction) 10/12/2015  . Acute respiratory failure ([place]) 08/11/2015  . Hard of hearing 08/11/2015  . CAD (nonobstructive per cath 2015) 08/11/2015  . HTN (hypertension) 08/11/2015  . HLD (hyperlipidemia)   . Malnutrition of moderate degree ([place]) 03/02/2015  . COPD exacerbation ([place]) 03/01/2015  . Chronic systolic congestive heart failure, NYHA class 3 ([place]) 03/01/2015  . Hyperthyroidism 11/06/2013  . Mass of adrenal gland ([place]) 11/04/2013  . Tobacco abuse 11/04/2013  . Hyponatremia 10/31/2013  . COPD (chronic obstructive pulmonary disease) ([place]) 10/31/2013    Past Surgical History:  Procedure Laterality Date  . APPENDECTOMY    . cardiac stents     No stent history  . LEFT HEART CATHETERIZATION WITH CORONARY ANGIOGRAM N/A 11/07/2013   Procedure: LEFT HEART CATHETERIZATION WITH CORONARY ANGIOGRAM;  Surgeon: Clent Demark, MD;  Location: [facility] CATH LAB;  Service: Cardiovascular;  Laterality: N/A;     Home Medications    Prior to Admission medications   Medication Sig Start Date End Date Taking? Authorizing Provider  albuterol (PROVENTIL HFA;VENTOLIN HFA) 108 (90 Base) MCG/ACT inhaler Inhale 2 puffs into the lungs every 4 (four) hours as needed for wheezing or shortness of breath. 09/19/16  Yes Quintella Reichert, MD  budesonide-formoterol [facility]) 160-4.5 MCG/ACT inhaler Inhale 2 puffs into the lungs 2 (two) times daily. 09/19/16  Yes Quintella Reichert, MD  tiotropium (SPIRIVA) 18 MCG inhalation capsule Place 1 capsule (18 mcg total) into inhaler and inhale daily. 09/19/16  Yes Quintella Reichert, MD  doxycycline (VIBRA-TABS)  100 MG tablet Take 1 tablet (100 mg total) by mouth 2 (two) times daily with a meal. Patient not taking: Reported on 10/13/2016 10/07/16   Orson Eva, MD  feeding supplement, ENSURE ENLIVE, (ENSURE ENLIVE) LIQD Take 237 mLs by mouth 3 (three) times daily between meals. Patient not taking: Reported on 10/13/2016 10/07/16   Orson Eva, MD    ipratropium-albuterol (DUONEB) 0.5-2.5 (3) MG/3ML SOLN Take 3 mLs by nebulization every 6 (six) hours as needed (wheezing, shortness of breath). 10/07/16   Orson Eva, MD  levofloxacin (LEVAQUIN) 750 MG tablet Take 1 tablet (750 mg total) by mouth daily. 10/14/16   Antonietta Breach, PA-C    Family History Family History  Problem Relation Age of Onset  . Stroke Mother     Social History Social History  Substance Use Topics  . Smoking status: Current Every Day Smoker    Packs/day: 0.50    Types: Cigarettes  . Smokeless tobacco: Never Used  . Alcohol use No     Allergies   Patient has no known allergies.   Review of Systems Review of Systems Ten systems reviewed and are negative for acute change, except as noted in the HPI.    Physical Exam Updated Vital Signs BP 105/84 (BP Location: Right Arm)   Pulse 89   Resp 16   Ht 5\' 9"  (1.753 m)   Wt 49.9 kg   SpO2 94%   BMI 16.24 kg/m   Physical Exam  Constitutional: He is oriented to person, place, and time. He appears well-developed and well-nourished. No distress.  Nontoxic and in NAD  HENT:  Head: Normocephalic and atraumatic.  Eyes: Conjunctivae and EOM are normal. No scleral icterus.  Neck: Normal range of motion.  Cardiovascular: Normal rate, regular rhythm and intact distal pulses.   Pulmonary/Chest: Effort normal. No respiratory distress. He has wheezes.  Prolonged expiratory phase. Faint, diffuse expiratory wheezes. Chest expansion symmetric. No rales or rhonchi. SpO2 99% on room air. No accessory muscle use.  Musculoskeletal: Normal range of motion.  No lower extremity edema.  Neurological: He is alert and oriented to person, place, and time. He exhibits normal muscle tone. Coordination normal.  GCS 15. Patient moving all extremities.  Skin: Skin is warm and dry. No rash noted. He is not diaphoretic. No erythema. No pallor.  Psychiatric: He has a normal mood and affect. His behavior is normal.  Nursing note and  vitals reviewed.    ED Treatments / Results  DIAGNOSTIC STUDIES: Oxygen Saturation is 100% on RA, normal by my interpretation.  COORDINATION OF CARE: 8:20 PM-Discussed treatment plan with pt at bedside and pt agreed to plan.   12:00 AM-Patient reassessed. He is sleeping. No respiratory distress. Lungs clear. SpO2 93% on room air while sleeping. He ambulates in the department without hypoxia.  Labs (all labs ordered are listed, but only abnormal results are displayed) Labs Reviewed  CBC WITH DIFFERENTIAL/PLATELET - Abnormal; Notable for the following:       Result Value   WBC 11.8 (*)    Neutro Abs 8.4 (*)    All other components within normal limits  BASIC METABOLIC PANEL - Abnormal; Notable for the following:    Sodium 132 (*)    Chloride 98 (*)    BUN 22 (*)    Creatinine, Ser 0.60 (*)    All other components within normal limits  BLOOD GAS, VENOUS - Abnormal; Notable for the following:    pO2, Ven 47.2 (*)    All  other components within normal limits  BRAIN NATRIURETIC PEPTIDE  I-STAT TROPOININ, ED    EKG  EKG Interpretation None       Radiology Dg Chest 2 View  Result Date: 10/13/2016 CLINICAL DATA:  Progressive shortness of breath.  Cough. EXAM: CHEST  2 VIEW COMPARISON:  10/06/2016 FINDINGS: Minimal ill-defined patchy opacity in the right middle lobe is new from prior exam. Lungs are hyperinflated in with apical predominant emphysema. Normal heart size and mediastinal contours. No definite pleural effusion, blunting the costophrenic angles secondary to hyperinflation. No pneumothorax. IMPRESSION: No minimal ill-defined patchy opacity in the right middle lobe combined new from prior exam, suspicious for pneumonia. Emphysema. Electronically Signed   By: Jeb Levering M.D.   On: 10/13/2016 22:43    Procedures Procedures (including critical care time)  Medications Ordered in ED Medications  albuterol (PROVENTIL HFA;VENTOLIN HFA) 108 (90 Base) MCG/ACT inhaler 2  puff (not administered)  albuterol (PROVENTIL,VENTOLIN) solution continuous neb (5 mg/hr Nebulization Given 10/13/16 2053)  ipratropium (ATROVENT) nebulizer solution 0.5 mg (0.5 mg Nebulization Given 10/13/16 2053)  dexamethasone (DECADRON) injection 10 mg (10 mg Intramuscular Given 10/13/16 2043)     Initial Impression / Assessment and Plan / ED Course  I have reviewed the triage vital signs and the nursing notes.  Pertinent labs & imaging results that were available during my care of the patient were reviewed by me and considered in my medical decision making (see chart for details).  Clinical Course     66 year old male with a history of COPD presents to the emergency department for evaluation of shortness of breath. Wheezing has resolved with DuoNeb. Decadron also given. On reassessment, patient is sleeping comfortably, in no distress. He is ambulatory in the department without hypoxia. Labs, overall, reassuring. Chest x-ray shows questionable new pneumonia. Given leukocytosis and COPD history, will cover with Levaquin; however, leukocytosis may also be secondary to recent course of steroids. No indication for further emergent workup. Patient advised to see his primary care doctor for repeat evaluation. Return precautions given at discharge. Patient discharged in stable condition with no unaddressed concerns.   Final Clinical Impressions(s) / ED Diagnoses   Final diagnoses:  COPD exacerbation (HCC)    New Prescriptions New Prescriptions   LEVOFLOXACIN (LEVAQUIN) 750 MG TABLET    Take 1 tablet (750 mg total) by mouth daily.   I personally performed the services described in this documentation, which was scribed in my presence. The recorded information has been reviewed and is accurate.      Antonietta Breach, PA-C 10/14/16 [place], MD 10/14/16 [phone]

## 2016-10-13 NOTE — ED Notes (Signed)
Bed: [id number] Expected date:  Expected time:  Means of arrival:  Comments: EMS wheezing/SOB

## 2016-10-13 NOTE — ED Triage Notes (Signed)
Pt is from home and is a smoker with COPD Hx.  Pt was showing 97% on RA upon EMS arrival prior to breathing Tx.  10mg  albuterol .5 Atrovent PTA to [facility].  Pt is A&O x4, HOH.

## 2016-10-14 MED ORDER — ALBUTEROL SULFATE HFA 108 (90 BASE) MCG/ACT IN AERS
2.0000 | INHALATION_SPRAY | Freq: Once | RESPIRATORY_TRACT | Status: AC
  Administered 2016-10-14: 2 via RESPIRATORY_TRACT
  Filled 2016-10-14: qty 6.7

## 2016-10-14 MED ORDER — LEVOFLOXACIN 750 MG PO TABS: 750.0000 mg | ORAL_TABLET | Freq: Every day | ORAL | 0 refills | Status: DC

## 2016-10-14 NOTE — ED Notes (Addendum)
Pt ambulated in hall with NAD.  Pt O2 ranges between 92 and 95% while ambulating

## 2016-10-14 NOTE — Discharge Instructions (Signed)
Take Levaquin as prescribed until finished. Continue with your daily Symbicort and Spiriva. You may use albuterol inhaler, 2 puffs every 4-6 hours, as needed for wheezing, chest tightness, or shortness of breath. Call your primary doctor in the morning to schedule follow-up in the office within the week.

## 2016-10-21 ENCOUNTER — Inpatient Hospital Stay (HOSPITAL_COMMUNITY)
Admission: EM | Admit: 2016-10-21 | Discharge: 2016-10-26 | DRG: 388 | Disposition: A | Discharge status: Home / Self Care | Payer: Medicare Other | Attending: Internal Medicine | Admitting: Internal Medicine

## 2016-10-21 ENCOUNTER — Encounter (HOSPITAL_COMMUNITY): Payer: Self-pay | Admitting: Emergency Medicine

## 2016-10-21 DIAGNOSIS — I7090 Unspecified atherosclerosis: Secondary | ICD-10-CM | POA: Diagnosis present

## 2016-10-21 DIAGNOSIS — E860 Dehydration: Secondary | ICD-10-CM | POA: Diagnosis present

## 2016-10-21 DIAGNOSIS — I110 Hypertensive heart disease with heart failure: Secondary | ICD-10-CM | POA: Diagnosis not present

## 2016-10-21 DIAGNOSIS — N179 Acute kidney failure, unspecified: Secondary | ICD-10-CM | POA: Diagnosis not present

## 2016-10-21 DIAGNOSIS — K567 Ileus, unspecified: Secondary | ICD-10-CM | POA: Diagnosis present

## 2016-10-21 DIAGNOSIS — K565 Intestinal adhesions [bands], unspecified as to partial versus complete obstruction: Secondary | ICD-10-CM

## 2016-10-21 DIAGNOSIS — R339 Retention of urine, unspecified: Secondary | ICD-10-CM | POA: Diagnosis present

## 2016-10-21 DIAGNOSIS — K56609 Unspecified intestinal obstruction, unspecified as to partial versus complete obstruction: Secondary | ICD-10-CM | POA: Diagnosis not present

## 2016-10-21 DIAGNOSIS — F17210 Nicotine dependence, cigarettes, uncomplicated: Secondary | ICD-10-CM | POA: Diagnosis present

## 2016-10-21 DIAGNOSIS — J42 Unspecified chronic bronchitis: Secondary | ICD-10-CM

## 2016-10-21 DIAGNOSIS — I2510 Atherosclerotic heart disease of native coronary artery without angina pectoris: Secondary | ICD-10-CM | POA: Diagnosis present

## 2016-10-21 DIAGNOSIS — D696 Thrombocytopenia, unspecified: Secondary | ICD-10-CM | POA: Diagnosis not present

## 2016-10-21 DIAGNOSIS — R64 Cachexia: Secondary | ICD-10-CM | POA: Diagnosis present

## 2016-10-21 DIAGNOSIS — Z79899 Other long term (current) drug therapy: Secondary | ICD-10-CM

## 2016-10-21 DIAGNOSIS — I10 Essential (primary) hypertension: Secondary | ICD-10-CM | POA: Diagnosis present

## 2016-10-21 DIAGNOSIS — E278 Other specified disorders of adrenal gland: Secondary | ICD-10-CM | POA: Diagnosis present

## 2016-10-21 DIAGNOSIS — Z9049 Acquired absence of other specified parts of digestive tract: Secondary | ICD-10-CM

## 2016-10-21 DIAGNOSIS — H9190 Unspecified hearing loss, unspecified ear: Secondary | ICD-10-CM | POA: Diagnosis present

## 2016-10-21 DIAGNOSIS — D649 Anemia, unspecified: Secondary | ICD-10-CM | POA: Diagnosis present

## 2016-10-21 DIAGNOSIS — R112 Nausea with vomiting, unspecified: Secondary | ICD-10-CM | POA: Diagnosis present

## 2016-10-21 DIAGNOSIS — R627 Adult failure to thrive: Secondary | ICD-10-CM | POA: Diagnosis present

## 2016-10-21 DIAGNOSIS — E0590 Thyrotoxicosis, unspecified without thyrotoxic crisis or storm: Secondary | ICD-10-CM | POA: Diagnosis present

## 2016-10-21 DIAGNOSIS — R1084 Generalized abdominal pain: Secondary | ICD-10-CM | POA: Diagnosis not present

## 2016-10-21 DIAGNOSIS — E43 Unspecified severe protein-calorie malnutrition: Secondary | ICD-10-CM | POA: Diagnosis not present

## 2016-10-21 DIAGNOSIS — I5022 Chronic systolic (congestive) heart failure: Secondary | ICD-10-CM | POA: Diagnosis present

## 2016-10-21 DIAGNOSIS — Z823 Family history of stroke: Secondary | ICD-10-CM

## 2016-10-21 DIAGNOSIS — R1110 Vomiting, unspecified: Secondary | ICD-10-CM | POA: Diagnosis not present

## 2016-10-21 DIAGNOSIS — J449 Chronic obstructive pulmonary disease, unspecified: Secondary | ICD-10-CM | POA: Diagnosis present

## 2016-10-21 DIAGNOSIS — J441 Chronic obstructive pulmonary disease with (acute) exacerbation: Secondary | ICD-10-CM

## 2016-10-21 DIAGNOSIS — I700 Atherosclerosis of aorta: Secondary | ICD-10-CM | POA: Diagnosis present

## 2016-10-21 DIAGNOSIS — Z7982 Long term (current) use of aspirin: Secondary | ICD-10-CM

## 2016-10-21 DIAGNOSIS — E279 Disorder of adrenal gland, unspecified: Secondary | ICD-10-CM | POA: Diagnosis present

## 2016-10-21 NOTE — ED Notes (Signed)
Bed: [id number] Expected date:  Expected time:  Means of arrival:  Comments: 66 yo M  General malaise

## 2016-10-21 NOTE — ED Triage Notes (Signed)
Patient here with complaints of abdominal pain for 4 days. Dry heaving. Tenderness to touch.

## 2016-10-22 ENCOUNTER — Emergency Department (HOSPITAL_COMMUNITY): Payer: Medicare Other

## 2016-10-22 ENCOUNTER — Encounter (HOSPITAL_COMMUNITY): Payer: Self-pay

## 2016-10-22 DIAGNOSIS — K565 Intestinal adhesions [bands], unspecified as to partial versus complete obstruction: Secondary | ICD-10-CM | POA: Diagnosis not present

## 2016-10-22 DIAGNOSIS — R1114 Bilious vomiting: Secondary | ICD-10-CM

## 2016-10-22 DIAGNOSIS — K567 Ileus, unspecified: Secondary | ICD-10-CM | POA: Diagnosis present

## 2016-10-22 DIAGNOSIS — Z823 Family history of stroke: Secondary | ICD-10-CM | POA: Diagnosis not present

## 2016-10-22 DIAGNOSIS — I110 Hypertensive heart disease with heart failure: Secondary | ICD-10-CM | POA: Diagnosis present

## 2016-10-22 DIAGNOSIS — F17210 Nicotine dependence, cigarettes, uncomplicated: Secondary | ICD-10-CM | POA: Diagnosis present

## 2016-10-22 DIAGNOSIS — R339 Retention of urine, unspecified: Secondary | ICD-10-CM | POA: Diagnosis present

## 2016-10-22 DIAGNOSIS — D696 Thrombocytopenia, unspecified: Secondary | ICD-10-CM | POA: Diagnosis present

## 2016-10-22 DIAGNOSIS — Z79899 Other long term (current) drug therapy: Secondary | ICD-10-CM | POA: Diagnosis not present

## 2016-10-22 DIAGNOSIS — R110 Nausea: Secondary | ICD-10-CM | POA: Diagnosis not present

## 2016-10-22 DIAGNOSIS — D649 Anemia, unspecified: Secondary | ICD-10-CM | POA: Diagnosis present

## 2016-10-22 DIAGNOSIS — E860 Dehydration: Secondary | ICD-10-CM | POA: Diagnosis present

## 2016-10-22 DIAGNOSIS — R1110 Vomiting, unspecified: Secondary | ICD-10-CM | POA: Diagnosis not present

## 2016-10-22 DIAGNOSIS — E279 Disorder of adrenal gland, unspecified: Secondary | ICD-10-CM | POA: Diagnosis present

## 2016-10-22 DIAGNOSIS — R627 Adult failure to thrive: Secondary | ICD-10-CM | POA: Diagnosis present

## 2016-10-22 DIAGNOSIS — Z9049 Acquired absence of other specified parts of digestive tract: Secondary | ICD-10-CM | POA: Diagnosis not present

## 2016-10-22 DIAGNOSIS — I5022 Chronic systolic (congestive) heart failure: Secondary | ICD-10-CM | POA: Diagnosis present

## 2016-10-22 DIAGNOSIS — R338 Other retention of urine: Secondary | ICD-10-CM | POA: Diagnosis not present

## 2016-10-22 DIAGNOSIS — I2510 Atherosclerotic heart disease of native coronary artery without angina pectoris: Secondary | ICD-10-CM | POA: Diagnosis present

## 2016-10-22 DIAGNOSIS — E0590 Thyrotoxicosis, unspecified without thyrotoxic crisis or storm: Secondary | ICD-10-CM

## 2016-10-22 DIAGNOSIS — N179 Acute kidney failure, unspecified: Secondary | ICD-10-CM | POA: Diagnosis not present

## 2016-10-22 DIAGNOSIS — K56609 Unspecified intestinal obstruction, unspecified as to partial versus complete obstruction: Secondary | ICD-10-CM | POA: Diagnosis not present

## 2016-10-22 DIAGNOSIS — Z7982 Long term (current) use of aspirin: Secondary | ICD-10-CM | POA: Diagnosis not present

## 2016-10-22 DIAGNOSIS — K5669 Other partial intestinal obstruction: Secondary | ICD-10-CM | POA: Diagnosis not present

## 2016-10-22 DIAGNOSIS — I700 Atherosclerosis of aorta: Secondary | ICD-10-CM | POA: Diagnosis present

## 2016-10-22 DIAGNOSIS — H9190 Unspecified hearing loss, unspecified ear: Secondary | ICD-10-CM | POA: Diagnosis present

## 2016-10-22 DIAGNOSIS — I7090 Unspecified atherosclerosis: Secondary | ICD-10-CM | POA: Diagnosis present

## 2016-10-22 DIAGNOSIS — E43 Unspecified severe protein-calorie malnutrition: Secondary | ICD-10-CM | POA: Diagnosis present

## 2016-10-22 DIAGNOSIS — R64 Cachexia: Secondary | ICD-10-CM | POA: Diagnosis present

## 2016-10-22 LAB — PROTIME-INR
INR: 1.01
Prothrombin Time: 13.3 seconds (ref 11.4–15.2)

## 2016-10-22 LAB — I-STAT CHEM 8, ED
BUN: 46 mg/dL — ABNORMAL HIGH (ref 6–20)
CALCIUM ION: 1.12 mmol/L — AB (ref 1.15–1.40)
Chloride: 93 mmol/L — ABNORMAL LOW (ref 101–111)
Creatinine, Ser: 1.1 mg/dL (ref 0.61–1.24)
GLUCOSE: 155 mg/dL — AB (ref 65–99)
HCT: 54 % — ABNORMAL HIGH (ref 39.0–52.0)
HEMOGLOBIN: 18.4 g/dL — AB (ref 13.0–17.0)
Potassium: 4.7 mmol/L (ref 3.5–5.1)
Sodium: 131 mmol/L — ABNORMAL LOW (ref 135–145)
TCO2: 31 mmol/L (ref 0–100)

## 2016-10-22 LAB — BILIRUBIN, DIRECT: BILIRUBIN DIRECT: 0.4 mg/dL (ref 0.1–0.5)

## 2016-10-22 LAB — COMPREHENSIVE METABOLIC PANEL
ALT: 22 U/L (ref 17–63)
AST: 32 U/L (ref 15–41)
Albumin: 4.5 g/dL (ref 3.5–5.0)
Alkaline Phosphatase: 73 U/L (ref 38–126)
Anion gap: 13 (ref 5–15)
BUN: 42 mg/dL — ABNORMAL HIGH (ref 6–20)
CHLORIDE: 91 mmol/L — AB (ref 101–111)
CO2: 27 mmol/L (ref 22–32)
CREATININE: 1.12 mg/dL (ref 0.61–1.24)
Calcium: 10 mg/dL (ref 8.9–10.3)
GFR calc Af Amer: >60 mL/min (ref >60)
Glucose, Bld: 154 mg/dL — ABNORMAL HIGH (ref 65–99)
Potassium: 4.8 mmol/L (ref 3.5–5.1)
Sodium: 131 mmol/L — ABNORMAL LOW (ref 135–145)
Total Bilirubin: 1.7 mg/dL — ABNORMAL HIGH (ref 0.3–1.2)
Total Protein: 8.2 g/dL — ABNORMAL HIGH (ref 6.5–8.1)

## 2016-10-22 LAB — LIPID PANEL
Cholesterol: 219 mg/dL — ABNORMAL HIGH (ref 0–200)
HDL: 70 mg/dL (ref >40)
LDL CALC: 129 mg/dL — AB (ref 0–99)
Total CHOL/HDL Ratio: 3.1 RATIO
Triglycerides: 101 mg/dL (ref <150)
VLDL: 20 mg/dL (ref 0–40)

## 2016-10-22 LAB — CBC
HCT: 48.9 % (ref 39.0–52.0)
Hemoglobin: 16.9 g/dL (ref 13.0–17.0)
MCH: 31.8 pg (ref 26.0–34.0)
MCHC: 34.6 g/dL (ref 30.0–36.0)
MCV: 91.9 fL (ref 78.0–100.0)
PLATELETS: 179 10*3/uL (ref 150–400)
RBC: 5.32 MIL/uL (ref 4.22–5.81)
RDW: 13.4 % (ref 11.5–15.5)
WBC: 10.4 10*3/uL (ref 4.0–10.5)

## 2016-10-22 LAB — TYPE AND SCREEN
ABO/RH(D): A POS
Antibody Screen: NEGATIVE

## 2016-10-22 LAB — APTT: aPTT: 26 seconds (ref 24–36)

## 2016-10-22 LAB — LIPASE, BLOOD: LIPASE: 22 U/L (ref 11–51)

## 2016-10-22 MED ORDER — MOMETASONE FURO-FORMOTEROL FUM 200-5 MCG/ACT IN AERO
2.0000 | INHALATION_SPRAY | Freq: Two times a day (BID) | RESPIRATORY_TRACT | Status: DC
  Administered 2016-10-22 – 2016-10-26 (×7): 2 via RESPIRATORY_TRACT
  Filled 2016-10-22 (×2): qty 8.8

## 2016-10-22 MED ORDER — SODIUM CHLORIDE 0.9 % IV SOLN
INTRAVENOUS | Status: DC
  Administered 2016-10-22 (×2): via INTRAVENOUS
  Administered 2016-10-23: 75 mL/h via INTRAVENOUS
  Administered 2016-10-24: 20:00:00 via INTRAVENOUS
  Administered 2016-10-24: 75 mL/h via INTRAVENOUS

## 2016-10-22 MED ORDER — IOPAMIDOL (ISOVUE-300) INJECTION 61%
100.0000 mL | Freq: Once | INTRAVENOUS | Status: AC | PRN
  Administered 2016-10-22: 80 mL via INTRAVENOUS

## 2016-10-22 MED ORDER — ONDANSETRON HCL 4 MG/2ML IJ SOLN
4.0000 mg | Freq: Once | INTRAMUSCULAR | Status: AC
  Administered 2016-10-22: 4 mg via INTRAVENOUS
  Filled 2016-10-22: qty 2

## 2016-10-22 MED ORDER — ONDANSETRON HCL 4 MG/2ML IJ SOLN
4.0000 mg | Freq: Three times a day (TID) | INTRAMUSCULAR | Status: DC | PRN
  Administered 2016-10-23: 4 mg via INTRAVENOUS
  Filled 2016-10-22: qty 2

## 2016-10-22 MED ORDER — TIOTROPIUM BROMIDE MONOHYDRATE 18 MCG IN CAPS
18.0000 ug | ORAL_CAPSULE | Freq: Every day | RESPIRATORY_TRACT | Status: DC
  Administered 2016-10-24 – 2016-10-26 (×3): 18 ug via RESPIRATORY_TRACT
  Filled 2016-10-22: qty 5

## 2016-10-22 MED ORDER — ENOXAPARIN SODIUM 40 MG/0.4ML ~~LOC~~ SOLN
40.0000 mg | SUBCUTANEOUS | Status: DC
  Administered 2016-10-22 – 2016-10-25 (×4): 40 mg via SUBCUTANEOUS
  Filled 2016-10-22 (×4): qty 0.4

## 2016-10-22 MED ORDER — MORPHINE SULFATE (PF) 2 MG/ML IV SOLN
2.0000 mg | INTRAVENOUS | Status: DC | PRN
  Administered 2016-10-26 (×2): 2 mg via INTRAVENOUS
  Filled 2016-10-22 (×2): qty 1

## 2016-10-22 MED ORDER — FENTANYL CITRATE (PF) 100 MCG/2ML IJ SOLN
25.0000 ug | Freq: Once | INTRAMUSCULAR | Status: AC
  Administered 2016-10-22: 25 ug via INTRAVENOUS
  Filled 2016-10-22: qty 2

## 2016-10-22 MED ORDER — BENZOCAINE 20 % MT AERO
INHALATION_SPRAY | Freq: Once | OROMUCOSAL | Status: AC
  Administered 2016-10-22: 02:00:00 via OROMUCOSAL
  Filled 2016-10-22: qty 57

## 2016-10-22 MED ORDER — IPRATROPIUM-ALBUTEROL 0.5-2.5 (3) MG/3ML IN SOLN
3.0000 mL | Freq: Four times a day (QID) | RESPIRATORY_TRACT | Status: DC | PRN
  Administered 2016-10-25: 3 mL via RESPIRATORY_TRACT
  Filled 2016-10-22: qty 3

## 2016-10-22 MED ORDER — IOPAMIDOL (ISOVUE-300) INJECTION 61%
INTRAVENOUS | Status: AC
  Administered 2016-10-22: 80 mL via INTRAVENOUS
  Filled 2016-10-22: qty 100

## 2016-10-22 NOTE — Consult Note (Signed)
Reason for Consult:SBO Referring Physician: Dr Crista Curb is an 66 y.o. male.  HPI: Pt presents to ED with nausea, vomiting for 3-4 days.  He states worsening abdominal pain brought him to the ED.  He has a h/o an open appendectomy in the past.  NG inserted in ED with copious bilious output.  CT scan shows SBO with transition point in RLQ.  Unfortunately pt pulled out NG shortly after that.  Past Medical History:  Diagnosis Date  . CHF (congestive heart failure) (HCC) EF 20 - 25% in 2015  . COPD (chronic obstructive pulmonary disease) ([place])   . HLD (hyperlipidemia)   . HOH (hard of hearing)   . Migraines     Past Surgical History:  Procedure Laterality Date  . APPENDECTOMY    . cardiac stents     No stent history  . LEFT HEART CATHETERIZATION WITH CORONARY ANGIOGRAM N/A 11/07/2013   Procedure: LEFT HEART CATHETERIZATION WITH CORONARY ANGIOGRAM;  Surgeon: Clent Demark, MD;  Location: [facility] CATH LAB;  Service: Cardiovascular;  Laterality: N/A;    Family History  Problem Relation Age of Onset  . Stroke Mother     Social History:  reports that he has been smoking Cigarettes.  He has been smoking about 0.50 packs per day. He has never used smokeless tobacco. He reports that he does not drink alcohol or use drugs.  Allergies: No Known Allergies  Medications: I have reviewed the patient's current medications.  Results for orders placed or performed during the hospital encounter of 10/21/16 (from the past 48 hour(s))  Lipase, blood     Status: None   Collection Time: 10/22/16 12:16 AM  Result Value Ref Range   Lipase 22 11 - 51 U/L  Comprehensive metabolic panel     Status: Abnormal   Collection Time: 10/22/16 12:16 AM  Result Value Ref Range   Sodium 131 (L) 135 - 145 mmol/L   Potassium 4.8 3.5 - 5.1 mmol/L   Chloride 91 (L) 101 - 111 mmol/L   CO2 27 22 - 32 mmol/L   Glucose, Bld 154 (H) 65 - 99 mg/dL   BUN 42 (H) 6 - 20 mg/dL   Creatinine, Ser 1.12 0.61 - 1.24  mg/dL   Calcium 10.0 8.9 - 10.3 mg/dL   Total Protein 8.2 (H) 6.5 - 8.1 g/dL   Albumin 4.5 3.5 - 5.0 g/dL   AST 32 15 - 41 U/L   ALT 22 17 - 63 U/L   Alkaline Phosphatase 73 38 - 126 U/L   Total Bilirubin 1.7 (H) 0.3 - 1.2 mg/dL   GFR calc non Af Amer >60 >60 mL/min   GFR calc Af Amer >60 >60 mL/min    Comment: (NOTE) The eGFR has been calculated using the CKD EPI equation. This calculation has not been validated in all clinical situations. eGFR's persistently <60 mL/min signify possible Chronic Kidney Disease.    Anion gap 13 5 - 15  CBC     Status: None   Collection Time: 10/22/16 12:16 AM  Result Value Ref Range   WBC 10.4 4.0 - 10.5 K/uL   RBC 5.32 4.22 - 5.81 MIL/uL   Hemoglobin 16.9 13.0 - 17.0 g/dL   HCT 48.9 39.0 - 52.0 %   MCV 91.9 78.0 - 100.0 fL   MCH 31.8 26.0 - 34.0 pg   MCHC 34.6 30.0 - 36.0 g/dL   RDW 13.4 11.5 - 15.5 %   Platelets 179 150 -  400 K/uL  Bilirubin, direct     Status: None   Collection Time: 10/22/16 12:16 AM  Result Value Ref Range   Bilirubin, Direct 0.4 0.1 - 0.5 mg/dL  I-Stat Chem 8, ED     Status: Abnormal   Collection Time: 10/22/16 12:29 AM  Result Value Ref Range   Sodium 131 (L) 135 - 145 mmol/L   Potassium 4.7 3.5 - 5.1 mmol/L   Chloride 93 (L) 101 - 111 mmol/L   BUN 46 (H) 6 - 20 mg/dL   Creatinine, Ser 1.10 0.61 - 1.24 mg/dL   Glucose, Bld 155 (H) 65 - 99 mg/dL   Calcium, Ion 1.12 (L) 1.15 - 1.40 mmol/L   TCO2 31 0 - 100 mmol/L   Hemoglobin 18.4 (H) 13.0 - 17.0 g/dL   HCT 54.0 (H) 39.0 - 52.0 %    Ct Abdomen Pelvis W Contrast  Result Date: 10/22/2016 CLINICAL DATA:  66 year old male with abdominal pain, nausea vomiting. Concern for small bowel obstruction. EXAM: CT ABDOMEN AND PELVIS WITH CONTRAST TECHNIQUE: Multidetector CT imaging of the abdomen and pelvis was performed using the standard protocol following bolus administration of intravenous contrast. CONTRAST:  100 cc Isovue-300 COMPARISON:  Abdominal CT dated 12/03/2015  FINDINGS: Lower chest: The visualized lung bases are clear. No intra-abdominal free air or free fluid. Hepatobiliary: No focal liver abnormality is seen. No gallstones, gallbladder wall thickening, or biliary dilatation. Pancreas: Unremarkable. No pancreatic ductal dilatation or surrounding inflammatory changes. Spleen: Normal in size without focal abnormality. Adrenals/Urinary Tract: There is a 3.9 x 3.7 cm heterogeneously enhancing low attenuating left adrenal mass similar to prior CT previously described as adenoma. The right adrenal gland appears unremarkable. The kidneys, visualized ureters, and urinary bladder appear unremarkable. Stomach/Bowel: An enteric tube is noted with tip in the proximal stomach. Multiple dilated and fluid-filled loops of small bowel noted throughout the abdomen measuring up to 6 cm in diameter. The distal and terminal ileum are collapsed. A transition zone is noted in the right hemiabdomen where there is swirling of the mesentery. Findings may be related to mesenteric volvulus or adhesions. Vascular/Lymphatic: There is calcified and noncalcified atheroma plaque along the course of the aorta. There has been significant interval progression of the noncalcified plaque along the course of the infrarenal abdominal aorta compared to the CT of 12/03/2015 suggestive of interval development of mural hemorrhage. There is approximately 50% narrowing of the aortic lumen at the level of L3 vertebra. The origins of the celiac axis, SMA, IMA as well as the origins of the renal arteries remain patent. The IVC is unremarkable. The SMV, visualized splenic vein, and main portal vein are patent. No portal venous gas identified. There is no adenopathy. Reproductive: Enlarged and heterogeneous prostate gland measuring 5.5 cm in transverse axial diameter. Other: There is loss of subcutaneous fat and cachexia. Musculoskeletal: No acute or significant osseous findings. IMPRESSION: High-grade small bowel  obstruction with transition zone in the right hemiabdomen likely related to adhesions or mesenteric volvulus. No pneumatosis or portal venous gas identified. No free air. Significant interval progression of aortic atherosclerotic disease with noncalcified atheroma plaques along the course of the abdominal aorta. There is associated approximately 50% luminal narrowing at the level of L3 vertebra. Rapid progression since the CT of 12/03/2015 suggests interval mural hemorrhage. Stable heterogeneously enhancing left adrenal mass. Electronically Signed   By: Anner Crete M.D.   On: 10/22/2016 03:13   Dg Abd Acute W/chest  Result Date: 10/22/2016 CLINICAL DATA:  Initial  evaluation for acute vomiting. EXAM: DG ABDOMEN ACUTE W/ 1V CHEST COMPARISON:  Prior radiograph from 10/13/2016. FINDINGS: Cardiac and mediastinal silhouettes are stable in size and contour, and remain within normal limits. Scattered atheromatous plaque noted within the aortic arch. Lungs are hyperinflated with severe attenuation of the pulmonary markings, compatible with emphysema. No focal infiltrates identified. No pulmonary edema or definite pleural effusion. No definite pneumothorax, although evaluation mildly limited by technique. Multiple dilated gas and fluid filled loops of small bowel seen within the abdomen. These measure up to approximately 5 cm in diameter. Air-fluid level seen on decubitus view. Findings concerning for small bowel obstruction. There is seen within the transverse colon. No free air on lateral decubitus view. No appreciable soft tissue mass or abnormal calcification. No acute osseous abnormality. IMPRESSION: 1. Multiple dilated loops of small bowel with associated air-fluid levels, concerning for small bowel obstruction. This could be further assessed with dedicated cross-sectional imaging of the abdomen. 2. Emphysema.  No other active cardiopulmonary disease identified. Electronically Signed   By: Jeannine Boga  M.D.   On: 10/22/2016 01:44    Review of Systems  Constitutional: Negative for chills and fever.  HENT: Negative for ear pain and hearing loss.   Eyes: Negative for blurred vision and double vision.  Respiratory: Negative for cough and shortness of breath.   Cardiovascular: Negative for chest pain and palpitations.  Gastrointestinal: Positive for nausea and vomiting.  Genitourinary: Negative for dysuria and urgency.  Musculoskeletal: Negative for joint pain and myalgias.  Skin: Negative for itching and rash.  Neurological: Negative for dizziness and headaches.   Blood pressure 114/87, pulse 74, temperature 97.8 F (36.6 C), temperature source Oral, resp. rate 14, SpO2 93 %. Physical Exam  Constitutional: He is oriented to person, place, and time. He appears well-developed and well-nourished.  HENT:  Head: Normocephalic and atraumatic.  Eyes: Conjunctivae and EOM are normal. Pupils are equal, round, and reactive to light.  Neck: Normal range of motion. Neck supple.  Cardiovascular: Normal rate and regular rhythm.   Respiratory: Effort normal and breath sounds normal.  GI: Soft. He exhibits distension. There is no tenderness. There is no rebound and no guarding.  Musculoskeletal: Normal range of motion.  Neurological: He is alert and oriented to person, place, and time.  Skin: Skin is warm and dry.    Assessment/Plan: 66 y.o. M with abdominal pain, nausea and vomiting.  Reinsert NG.  NPO.  Pt is not a good surgical candidate due to his multiple co-morbidities.  Will follow with you.  Keatyn Luck C. 2/[id number]/6644, 0:34 AM

## 2016-10-22 NOTE — ED Provider Notes (Signed)
[facility] DEPT Provider Note   CSN: [id number] Arrival date & time: 10/21/16  2342  By signing my name below, I, Julien Nordmann, attest that this documentation has been prepared under the direction and in the presence of Briget Shaheed, MD.  Electronically Signed: Julien Nordmann, ED Scribe. 10/22/16. 12:22 AM.    History   Chief Complaint Chief Complaint  Patient presents with  . Abdominal Pain   The history is provided by the EMS personnel. History limited by: pt is not cooperating  No language interpreter was used.  Abdominal Pain   This is a new problem. The problem has not changed since onset.The pain is located in the generalized abdominal region. The pain is severe. Associated symptoms include nausea and vomiting. Nothing aggravates the symptoms. Nothing relieves the symptoms. Past workup includes surgery. His past medical history does not include Crohn's disease.   LEVEL V CAVEAT: HPI and ROS limited due to not willing to answer questions  HPI Comments: FAVIO BREZINA is a 66 y.o. male brought in by ambulance, who has a PMhx of CHF, COPD, HLD, dehydration, SBO, HTN presents to the Emergency Department presenting with gradual worsening generalized abdominal pain x 4 days. He has been vomiting bile as well. Pt has a hx of multiple abdominal surgical hx in the past. Pt is reluctant to answer questions when asked.  Past Medical History:  Diagnosis Date  . CHF (congestive heart failure) (HCC) EF 20 - 25% in 2015  . COPD (chronic obstructive pulmonary disease) ([place])   . HLD (hyperlipidemia)   . HOH (hard of hearing)   . Migraines     Patient Active Problem List   Diagnosis Date Noted  . Protein-calorie malnutrition, severe 10/07/2016  . Acute respiratory failure with hypoxia ([place]) 10/07/2016  . Other emphysema ([place]) 10/07/2016  . Transaminasemia 12/03/2015  . Dehydration   . High anion gap metabolic acidosis 123456  . Malnutrition due to starvation ([place])  12/02/2015  . Leukocytosis 12/02/2015  . Abdominal pain 10/12/2015  . Nausea & vomiting 10/12/2015  . Chest wall pain 10/12/2015  . AKI (acute kidney injury) ([place]) 10/12/2015  . SBO (small bowel obstruction) 10/12/2015  . Acute respiratory failure ([place]) 08/11/2015  . Hard of hearing 08/11/2015  . CAD (nonobstructive per cath 2015) 08/11/2015  . HTN (hypertension) 08/11/2015  . HLD (hyperlipidemia)   . Malnutrition of moderate degree ([place]) 03/02/2015  . COPD exacerbation ([place]) 03/01/2015  . Chronic systolic congestive heart failure, NYHA class 3 ([place]) 03/01/2015  . Hyperthyroidism 11/06/2013  . Mass of adrenal gland ([place]) 11/04/2013  . Tobacco abuse 11/04/2013  . Hyponatremia 10/31/2013  . COPD (chronic obstructive pulmonary disease) ([place]) 10/31/2013    Past Surgical History:  Procedure Laterality Date  . APPENDECTOMY    . cardiac stents     No stent history  . LEFT HEART CATHETERIZATION WITH CORONARY ANGIOGRAM N/A 11/07/2013   Procedure: LEFT HEART CATHETERIZATION WITH CORONARY ANGIOGRAM;  Surgeon: Clent Demark, MD;  Location: [facility] CATH LAB;  Service: Cardiovascular;  Laterality: N/A;       Home Medications    Prior to Admission medications   Medication Sig Start Date End Date Taking? Authorizing Provider  albuterol (PROVENTIL HFA;VENTOLIN HFA) 108 (90 Base) MCG/ACT inhaler Inhale 2 puffs into the lungs every 4 (four) hours as needed for wheezing or shortness of breath. 09/19/16   Quintella Reichert, MD  budesonide-formoterol [facility]) 160-4.5 MCG/ACT inhaler Inhale 2 puffs into the lungs 2 (two) times daily. 09/19/16  Quintella Reichert, MD  doxycycline (VIBRA-TABS) 100 MG tablet Take 1 tablet (100 mg total) by mouth 2 (two) times daily with a meal. Patient not taking: Reported on 10/13/2016 10/07/16   Orson Eva, MD  feeding supplement, ENSURE ENLIVE, (ENSURE ENLIVE) LIQD Take 237 mLs by mouth 3 (three) times daily between meals. Patient not taking: Reported on 10/13/2016 10/07/16    Orson Eva, MD  ipratropium-albuterol (DUONEB) 0.5-2.5 (3) MG/3ML SOLN Take 3 mLs by nebulization every 6 (six) hours as needed (wheezing, shortness of breath). 10/07/16   Orson Eva, MD  levofloxacin (LEVAQUIN) 750 MG tablet Take 1 tablet (750 mg total) by mouth daily. 10/14/16   Antonietta Breach, PA-C  tiotropium (SPIRIVA) 18 MCG inhalation capsule Place 1 capsule (18 mcg total) into inhaler and inhale daily. 09/19/16   Quintella Reichert, MD    Family History Family History  Problem Relation Age of Onset  . Stroke Mother     Social History Social History  Substance Use Topics  . Smoking status: Current Every Day Smoker    Packs/day: 0.50    Types: Cigarettes  . Smokeless tobacco: Never Used  . Alcohol use No     Allergies   Patient has no known allergies.   Review of Systems Review of Systems  Gastrointestinal: Positive for abdominal pain, nausea and vomiting.  All other systems reviewed and are negative.  LEVEL V CAVEAT: HPI and ROS limited due to pt reluctant to answer questions.   Physical Exam Updated Vital Signs BP 114/87 (BP Location: Left Arm)   Pulse 74   Temp 97.8 F (36.6 C) (Oral)   Resp 14   SpO2 93%   Physical Exam  Constitutional: He appears well-developed.  Chronically ill appearing  HENT:  Head: Normocephalic and atraumatic.  Mouth/Throat: Oropharynx is clear and moist. No oropharyngeal exudate.  Moist mucous membranes. No exudates.   Eyes: Conjunctivae and EOM are normal. Pupils are equal, round, and reactive to light.  Neck: Normal range of motion. Neck supple. No JVD present. No tracheal deviation present.  No carotid bruits. Trachea midline.   Cardiovascular: Normal rate, regular rhythm, normal heart sounds and intact distal pulses.  Exam reveals no gallop and no friction rub.   No murmur heard. RRR.   Pulmonary/Chest: Effort normal. No stridor. No respiratory distress. He has decreased breath sounds. He has no wheezes. He has no rales.  Diminished  breath sounds bilaterally  Abdominal: Soft. He exhibits distension. Bowel sounds are decreased. There is tenderness. There is guarding. There is no rebound.  Musculoskeletal: Normal range of motion. He exhibits no deformity.  Lymphadenopathy:    He has no cervical adenopathy.  Neurological: He is alert. He has normal reflexes. He displays normal reflexes.  DTRs intact, intact distal pulses  Skin: Skin is warm and dry. Capillary refill takes less than 2 seconds.  Psychiatric: He has a normal mood and affect.  Nursing note and vitals reviewed.    ED Treatments / Results   Vitals:   10/21/16 2347  BP: 114/87  Pulse: 74  Resp: 14  Temp: 97.8 F (36.6 C)    Results for orders placed or performed during the hospital encounter of 10/21/16  Lipase, blood  Result Value Ref Range   Lipase 22 11 - 51 U/L  Comprehensive metabolic panel  Result Value Ref Range   Sodium 131 (L) 135 - 145 mmol/L   Potassium 4.8 3.5 - 5.1 mmol/L   Chloride 91 (L) 101 - 111 mmol/L  CO2 27 22 - 32 mmol/L   Glucose, Bld 154 (H) 65 - 99 mg/dL   BUN 42 (H) 6 - 20 mg/dL   Creatinine, Ser 1.12 0.61 - 1.24 mg/dL   Calcium 10.0 8.9 - 10.3 mg/dL   Total Protein 8.2 (H) 6.5 - 8.1 g/dL   Albumin 4.5 3.5 - 5.0 g/dL   AST 32 15 - 41 U/L   ALT 22 17 - 63 U/L   Alkaline Phosphatase 73 38 - 126 U/L   Total Bilirubin 1.7 (H) 0.3 - 1.2 mg/dL   GFR calc non Af Amer >60 >60 mL/min   GFR calc Af Amer >60 >60 mL/min   Anion gap 13 5 - 15  CBC  Result Value Ref Range   WBC 10.4 4.0 - 10.5 K/uL   RBC 5.32 4.22 - 5.81 MIL/uL   Hemoglobin 16.9 13.0 - 17.0 g/dL   HCT 48.9 39.0 - 52.0 %   MCV 91.9 78.0 - 100.0 fL   MCH 31.8 26.0 - 34.0 pg   MCHC 34.6 30.0 - 36.0 g/dL   RDW 13.4 11.5 - 15.5 %   Platelets 179 150 - 400 K/uL  Bilirubin, direct  Result Value Ref Range   Bilirubin, Direct 0.4 0.1 - 0.5 mg/dL  I-Stat Chem 8, ED  Result Value Ref Range   Sodium 131 (L) 135 - 145 mmol/L   Potassium 4.7 3.5 - 5.1 mmol/L     Chloride 93 (L) 101 - 111 mmol/L   BUN 46 (H) 6 - 20 mg/dL   Creatinine, Ser 1.10 0.61 - 1.24 mg/dL   Glucose, Bld 155 (H) 65 - 99 mg/dL   Calcium, Ion 1.12 (L) 1.15 - 1.40 mmol/L   TCO2 31 0 - 100 mmol/L   Hemoglobin 18.4 (H) 13.0 - 17.0 g/dL   HCT 54.0 (H) 39.0 - 52.0 %   Dg Chest 2 View  Result Date: 10/13/2016 CLINICAL DATA:  Progressive shortness of breath.  Cough. EXAM: CHEST  2 VIEW COMPARISON:  10/06/2016 FINDINGS: Minimal ill-defined patchy opacity in the right middle lobe is new from prior exam. Lungs are hyperinflated in with apical predominant emphysema. Normal heart size and mediastinal contours. No definite pleural effusion, blunting the costophrenic angles secondary to hyperinflation. No pneumothorax. IMPRESSION: No minimal ill-defined patchy opacity in the right middle lobe combined new from prior exam, suspicious for pneumonia. Emphysema. Electronically Signed   By: Jeb Levering M.D.   On: 10/13/2016 22:43   Ct Abdomen Pelvis W Contrast  Result Date: 10/22/2016 CLINICAL DATA:  66 year old male with abdominal pain, nausea vomiting. Concern for small bowel obstruction. EXAM: CT ABDOMEN AND PELVIS WITH CONTRAST TECHNIQUE: Multidetector CT imaging of the abdomen and pelvis was performed using the standard protocol following bolus administration of intravenous contrast. CONTRAST:  100 cc Isovue-300 COMPARISON:  Abdominal CT dated 12/03/2015 FINDINGS: Lower chest: The visualized lung bases are clear. No intra-abdominal free air or free fluid. Hepatobiliary: No focal liver abnormality is seen. No gallstones, gallbladder wall thickening, or biliary dilatation. Pancreas: Unremarkable. No pancreatic ductal dilatation or surrounding inflammatory changes. Spleen: Normal in size without focal abnormality. Adrenals/Urinary Tract: There is a 3.9 x 3.7 cm heterogeneously enhancing low attenuating left adrenal mass similar to prior CT previously described as adenoma. The right adrenal gland  appears unremarkable. The kidneys, visualized ureters, and urinary bladder appear unremarkable. Stomach/Bowel: An enteric tube is noted with tip in the proximal stomach. Multiple dilated and fluid-filled loops of small bowel noted throughout the abdomen  measuring up to 6 cm in diameter. The distal and terminal ileum are collapsed. A transition zone is noted in the right hemiabdomen where there is swirling of the mesentery. Findings may be related to mesenteric volvulus or adhesions. Vascular/Lymphatic: There is calcified and noncalcified atheroma plaque along the course of the aorta. There has been significant interval progression of the noncalcified plaque along the course of the infrarenal abdominal aorta compared to the CT of 12/03/2015 suggestive of interval development of mural hemorrhage. There is approximately 50% narrowing of the aortic lumen at the level of L3 vertebra. The origins of the celiac axis, SMA, IMA as well as the origins of the renal arteries remain patent. The IVC is unremarkable. The SMV, visualized splenic vein, and main portal vein are patent. No portal venous gas identified. There is no adenopathy. Reproductive: Enlarged and heterogeneous prostate gland measuring 5.5 cm in transverse axial diameter. Other: There is loss of subcutaneous fat and cachexia. Musculoskeletal: No acute or significant osseous findings. IMPRESSION: High-grade small bowel obstruction with transition zone in the right hemiabdomen likely related to adhesions or mesenteric volvulus. No pneumatosis or portal venous gas identified. No free air. Significant interval progression of aortic atherosclerotic disease with noncalcified atheroma plaques along the course of the abdominal aorta. There is associated approximately 50% luminal narrowing at the level of L3 vertebra. Rapid progression since the CT of 12/03/2015 suggests interval mural hemorrhage. Stable heterogeneously enhancing left adrenal mass. Electronically Signed    By: Anner Crete M.D.   On: 10/22/2016 03:13   Dg Chest Portable 1 View  Result Date: 10/06/2016 CLINICAL DATA:  Respiratory distress EXAM: PORTABLE CHEST 1 VIEW COMPARISON:  09/19/2016 FINDINGS: The lungs are hyperinflated. There is no focal infiltrate, consolidation or effusion. Stable cardiomediastinal silhouette. No pneumothorax. IMPRESSION: Hyperinflation without focal infiltrate Electronically Signed   By: Donavan Foil M.D.   On: 10/06/2016 17:59   Dg Abd Acute W/chest  Result Date: 10/22/2016 CLINICAL DATA:  Initial evaluation for acute vomiting. EXAM: DG ABDOMEN ACUTE W/ 1V CHEST COMPARISON:  Prior radiograph from 10/13/2016. FINDINGS: Cardiac and mediastinal silhouettes are stable in size and contour, and remain within normal limits. Scattered atheromatous plaque noted within the aortic arch. Lungs are hyperinflated with severe attenuation of the pulmonary markings, compatible with emphysema. No focal infiltrates identified. No pulmonary edema or definite pleural effusion. No definite pneumothorax, although evaluation mildly limited by technique. Multiple dilated gas and fluid filled loops of small bowel seen within the abdomen. These measure up to approximately 5 cm in diameter. Air-fluid level seen on decubitus view. Findings concerning for small bowel obstruction. There is seen within the transverse colon. No free air on lateral decubitus view. No appreciable soft tissue mass or abnormal calcification. No acute osseous abnormality. IMPRESSION: 1. Multiple dilated loops of small bowel with associated air-fluid levels, concerning for small bowel obstruction. This could be further assessed with dedicated cross-sectional imaging of the abdomen. 2. Emphysema.  No other active cardiopulmonary disease identified. Electronically Signed   By: Jeannine Boga M.D.   On: 10/22/2016 01:44     COORDINATION OF CARE:  12:22 AM Discussed treatment plan with pt at bedside and pt agreed to  plan.   Procedures Procedures (including critical care time)  Medications Ordered in ED Medications  0.9 %  sodium chloride infusion (not administered)  fentaNYL (SUBLIMAZE) injection 25 mcg (not administered)  ondansetron (ZOFRAN) injection 4 mg (4 mg Intravenous Given 10/22/16 0027)  Benzocaine (HURRCAINE) 20 % mouth spray ( Mouth/Throat  Given 10/22/16 0218)  iopamidol (ISOVUE-300) 61 % injection 100 mL (80 mLs Intravenous Contrast Given 10/22/16 0234)   Case d/w Dr. Marcello Moores of surgery [facility] to consult.   Final Clinical Impressions(s) / ED Diagnoses  High grade SBO:  NGT placed with copious output surgery to consult medicine to admit   Zakyia Gagan, MD 10/22/16 [phone]

## 2016-10-22 NOTE — Progress Notes (Signed)
Patient continues to refuse placement of NG tube.

## 2016-10-22 NOTE — H&P (Addendum)
History and Physical:    Anthony Bolton   [id number] DOB: 12-Oct-1950 DOA: 10/21/2016  Referring MD/provider: April Palumbo, MD PCP: [name], MD   Patient coming from:   Chief Complaint: Nausea and vomiting x 3 days.  History of Present Illness:   Anthony Bolton is an 66 y.o. male with a PMH of prior appendectomy, COPD, chronic systolic CHF (EF 123XX123 percent in 2015), nonobstructive CAD per cardiac catheterization also in 2015, prior SBO that resolved with conservative management, was brought to the ED via EMS for evaluation of a 4 day history of worsening generalized abdominal pain associated with multiple episodes of vomiting. According to the ED physician's notes, the patient was not cooperative with answering any questions. He was noted to be vomiting bile. When I try to engage him in conversation, he stares off and does not respond to my questions although it is clear he is doing so willfully.  He did ask me for some water.  ED Course:  Imaging showed findings of a high grade SBO. The patient had an NG tube placed in the ED with a large volume of bilious material drained however he subsequently pulled the NG tube out and refused to have it replaced. Labs were notable for normal lipase, sodium 131, BUN 42, creatinine 1.12 (baseline creatinine 0.6). General surgery consulted with recommendations to maintain nothing by mouth status and continue NG decompression. He is not felt to be a good surgical candidate due to his multiple comorbidities.  ROS:   Review of Systems  Unable to perform ROS: Psychiatric disorder  Patient appears to hear my questions, but refuses to answer any of them.  Past Medical History:   Past Medical History:  Diagnosis Date  . CHF (congestive heart failure) (HCC) EF 20 - 25% in 2015  . COPD (chronic obstructive pulmonary disease) ([place])   . HLD (hyperlipidemia)   . HOH (hard of hearing)   . Migraines     Past Surgical History:   Past  Surgical History:  Procedure Laterality Date  . APPENDECTOMY    . cardiac stents     No stent history  . LEFT HEART CATHETERIZATION WITH CORONARY ANGIOGRAM N/A 11/07/2013   Procedure: LEFT HEART CATHETERIZATION WITH CORONARY ANGIOGRAM;  Surgeon: Clent Demark, MD;  Location: [facility] CATH LAB;  Service: Cardiovascular;  Laterality: N/A;    Social History:   Social History   Social History  . Marital status: Single    Spouse name: N/A  . Number of children: N/A  . Years of education: N/A   Occupational History  . Not on file.   Social History Main Topics  . Smoking status: Current Every Day Smoker    Packs/day: 0.50    Types: Cigarettes  . Smokeless tobacco: Never Used  . Alcohol use No  . Drug use: No  . Sexual activity: No   Other Topics Concern  . Not on file   Social History Narrative  . No narrative on file    Allergies   Patient has no known allergies.  Family history:   Family History  Problem Relation Age of Onset  . Stroke Mother     Current Medications:   Prior to Admission medications   Medication Sig Start Date End Date Taking? Authorizing Provider  albuterol (PROVENTIL HFA;VENTOLIN HFA) 108 (90 Base) MCG/ACT inhaler Inhale 2 puffs into the lungs every 4 (four) hours as needed for wheezing or shortness of breath. 09/19/16  Yes Quintella Reichert,  MD  budesonide-formoterol (SYMBICORT) 160-4.5 MCG/ACT inhaler Inhale 2 puffs into the lungs 2 (two) times daily. 09/19/16  Yes Quintella Reichert, MD  ipratropium-albuterol (DUONEB) 0.5-2.5 (3) MG/3ML SOLN Take 3 mLs by nebulization every 6 (six) hours as needed (wheezing, shortness of breath). 10/07/16  Yes Orson Eva, MD  tiotropium (SPIRIVA) 18 MCG inhalation capsule Place 1 capsule (18 mcg total) into inhaler and inhale daily. 09/19/16  Yes Quintella Reichert, MD  doxycycline (VIBRA-TABS) 100 MG tablet Take 1 tablet (100 mg total) by mouth 2 (two) times daily with a meal. Patient not taking: Reported on 10/13/2016 10/07/16    Orson Eva, MD  feeding supplement, ENSURE ENLIVE, (ENSURE ENLIVE) LIQD Take 237 mLs by mouth 3 (three) times daily between meals. Patient not taking: Reported on 10/13/2016 10/07/16   Orson Eva, MD  levofloxacin (LEVAQUIN) 750 MG tablet Take 1 tablet (750 mg total) by mouth daily. Patient not taking: Reported on 10/22/2016 10/14/16   Antonietta Breach, PA-C    Physical Exam:   Vitals:   10/21/16 2347 10/22/16 0706 10/22/16 0837 10/22/16 0909  BP: 114/87 108/92 108/89   Pulse: 74 98 (!) 104   Resp: 14 18 18    Temp: 97.8 F (36.6 C)  98.6 F (37 C)   TempSrc: Oral  Oral   SpO2: 93% 96% 94%   Weight:    48.4 kg (106 lb 11.2 oz)     Physical Exam: Blood pressure 108/89, pulse (!) 104, temperature 98.6 F (37 C), temperature source Oral, resp. rate 18, weight 48.4 kg (106 lb 11.2 oz), SpO2 94 %. Gen: No acute distress. Was sleeping, aroused to gentle touch, but refuses to answer any questions. Cachectic appearing. Head: Normocephalic, atraumatic. Eyes: Pupils equal, round and reactive to light. Extraocular movements intact.  Sclerae nonicteric. No lid lag. Mouth: Edentulous, mucous membranes mildly dry. Neck: Supple, no thyromegaly, no lymphadenopathy, no jugular venous distention. Chest: Lungs are clear to auscultation, diminished bilaterally with fair air movement. No rales, rhonchi or wheezes.  CV: Heart sounds are regular with an S1, S2. No murmurs, rubs, clicks, or gallops.  Abdomen: Soft, nontender, mildly distended with normal active bowel sounds. No hepatosplenomegaly or palpable masses. Extremities: Extremities are without clubbing, edema, or cyanosis. Pedal pulses 2+. Skin: Warm and dry. No rashes, lesions or wounds. Neuro: Alert but unable to assess orientation; grossly nonfocal.  Psych: Insight is poor and judgment is impaired. Mood and affect flat.   Data Review:    Labs: Basic Metabolic Panel:  Recent Labs Lab 10/22/16 0016 10/22/16 0029  NA 131* 131*  K 4.8 4.7    CL 91* 93*  CO2 27  --   GLUCOSE 154* 155*  BUN 42* 46*  CREATININE 1.12 1.10  CALCIUM 10.0  --    Liver Function Tests:  Recent Labs Lab 10/22/16 0016  AST 32  ALT 22  ALKPHOS 73  BILITOT 1.7*  PROT 8.2*  ALBUMIN 4.5    Recent Labs Lab 10/22/16 0016  LIPASE 22   No results for input(s): AMMONIA in the last 168 hours. CBC:  Recent Labs Lab 10/22/16 0016 10/22/16 0029  WBC 10.4  --   HGB 16.9 18.4*  HCT 48.9 54.0*  MCV 91.9  --   PLT 179  --    Cardiac Enzymes: No results for input(s): CKTOTAL, CKMB, CKMBINDEX, TROPONINI in the last 168 hours.  BNP (last 3 results) No results for input(s): PROBNP in the last 8760 hours. CBG: No results for input(s): GLUCAP in  the last 168 hours.  Urinalysis    Component Value Date/Time   COLORURINE YELLOW 04/06/2016 1550   APPEARANCEUR CLEAR 04/06/2016 1550   LABSPEC 1.014 04/06/2016 1550   PHURINE 7.5 04/06/2016 1550   GLUCOSEU NEGATIVE 04/06/2016 1550   HGBUR NEGATIVE 04/06/2016 1550   BILIRUBINUR NEGATIVE 04/06/2016 1550   KETONESUR NEGATIVE 04/06/2016 1550   PROTEINUR NEGATIVE 04/06/2016 1550   UROBILINOGEN 0.2 05/02/2015 1439   NITRITE NEGATIVE 04/06/2016 1550   LEUKOCYTESUR NEGATIVE 04/06/2016 1550      Radiographic Studies: Ct Abdomen Pelvis W Contrast  Result Date: 10/22/2016 CLINICAL DATA:  66 year old male with abdominal pain, nausea vomiting. Concern for small bowel obstruction. EXAM: CT ABDOMEN AND PELVIS WITH CONTRAST TECHNIQUE: Multidetector CT imaging of the abdomen and pelvis was performed using the standard protocol following bolus administration of intravenous contrast. CONTRAST:  100 cc Isovue-300 COMPARISON:  Abdominal CT dated 12/03/2015 FINDINGS: Lower chest: The visualized lung bases are clear. No intra-abdominal free air or free fluid. Hepatobiliary: No focal liver abnormality is seen. No gallstones, gallbladder wall thickening, or biliary dilatation. Pancreas: Unremarkable. No pancreatic  ductal dilatation or surrounding inflammatory changes. Spleen: Normal in size without focal abnormality. Adrenals/Urinary Tract: There is a 3.9 x 3.7 cm heterogeneously enhancing low attenuating left adrenal mass similar to prior CT previously described as adenoma. The right adrenal gland appears unremarkable. The kidneys, visualized ureters, and urinary bladder appear unremarkable. Stomach/Bowel: An enteric tube is noted with tip in the proximal stomach. Multiple dilated and fluid-filled loops of small bowel noted throughout the abdomen measuring up to 6 cm in diameter. The distal and terminal ileum are collapsed. A transition zone is noted in the right hemiabdomen where there is swirling of the mesentery. Findings may be related to mesenteric volvulus or adhesions. Vascular/Lymphatic: There is calcified and noncalcified atheroma plaque along the course of the aorta. There has been significant interval progression of the noncalcified plaque along the course of the infrarenal abdominal aorta compared to the CT of 12/03/2015 suggestive of interval development of mural hemorrhage. There is approximately 50% narrowing of the aortic lumen at the level of L3 vertebra. The origins of the celiac axis, SMA, IMA as well as the origins of the renal arteries remain patent. The IVC is unremarkable. The SMV, visualized splenic vein, and main portal vein are patent. No portal venous gas identified. There is no adenopathy. Reproductive: Enlarged and heterogeneous prostate gland measuring 5.5 cm in transverse axial diameter. Other: There is loss of subcutaneous fat and cachexia. Musculoskeletal: No acute or significant osseous findings. IMPRESSION: High-grade small bowel obstruction with transition zone in the right hemiabdomen likely related to adhesions or mesenteric volvulus. No pneumatosis or portal venous gas identified. No free air. Significant interval progression of aortic atherosclerotic disease with noncalcified atheroma  plaques along the course of the abdominal aorta. There is associated approximately 50% luminal narrowing at the level of L3 vertebra. Rapid progression since the CT of 12/03/2015 suggests interval mural hemorrhage. Stable heterogeneously enhancing left adrenal mass. Electronically Signed   By: Anner Crete M.D.   On: 10/22/2016 03:13   Dg Abd Acute W/chest  Result Date: 10/22/2016 CLINICAL DATA:  Initial evaluation for acute vomiting. EXAM: DG ABDOMEN ACUTE W/ 1V CHEST COMPARISON:  Prior radiograph from 10/13/2016. FINDINGS: Cardiac and mediastinal silhouettes are stable in size and contour, and remain within normal limits. Scattered atheromatous plaque noted within the aortic arch. Lungs are hyperinflated with severe attenuation of the pulmonary markings, compatible with emphysema. No focal infiltrates  identified. No pulmonary edema or definite pleural effusion. No definite pneumothorax, although evaluation mildly limited by technique. Multiple dilated gas and fluid filled loops of small bowel seen within the abdomen. These measure up to approximately 5 cm in diameter. Air-fluid level seen on decubitus view. Findings concerning for small bowel obstruction. There is seen within the transverse colon. No free air on lateral decubitus view. No appreciable soft tissue mass or abnormal calcification. No acute osseous abnormality. IMPRESSION: 1. Multiple dilated loops of small bowel with associated air-fluid levels, concerning for small bowel obstruction. This could be further assessed with dedicated cross-sectional imaging of the abdomen. 2. Emphysema.  No other active cardiopulmonary disease identified. Electronically Signed   By: Jeannine Boga M.D.   On: 10/22/2016 01:44     EKG: None ordered.   Assessment/Plan:   Principal Problem:   SBO (small bowel obstruction) with abdominal pain, nausea and vomiting Images reviewed. Evaluated by surgery, not felt to be a good surgical candidate.  Patient  pulled NG tube out after initial decompression, and refuses to have it replaced.  Continue NPO.  Active Problems:   Mass of adrenal gland (HCC) Stable on imaging.    Hyperthyroidism Not currently being treated.    Chronic systolic congestive heart failure, NYHA class 3 (HCC) No evidence of decompensation. EF 20-25 percent in 2015.    CAD (nonobstructive per cath 2015)/Atheroma of artery/Aortic atherosclerosis ([place]) Will need to be on anti-platelet therapy once it is ascertained that no surgery indicated. Check FLP. May benefit from statin.    AKI (acute kidney injury) (HCC)/Dehydration Creatinine has doubled over baseline values.  Likely from dehydration.  Hydrate and monitor.    Protein-calorie malnutrition, severe Cachectic appearing.  Dietician consult when diet advanced.    COPD Continue Spiriva, Duonebs PRN and Symbicort.   Attestation regarding necessity of inpatient status:   The appropriate admission status for this patient is INPATIENT. Inpatient status is judged to be reasonable and necessary in order to provide the required intensity of service to ensure the patient's safety. The patient's presenting symptoms, physical exam findings, and initial radiographic and laboratory data in the context of their chronic comorbidities is felt to place them at high risk for further clinical deterioration. Furthermore, it is not anticipated that the patient will be medically stable for discharge from the hospital within 2 midnights of admission. The following factors support the admission status of inpatient.    The patient's presenting symptoms include nausea, vomiting, abdominal pain.  The worrisome physical exam findings include abdominal distention, bilious vomiting.  The initial radiographic and laboratory data are worrisome because of high grade SBO.  The chronic co-morbidities include COPD, CAD, aortic atherosclerosis with rapid progression concerning for a mural hemorrhage,  adrenal mass, severe protein calorie malnutrition.   * I certify that at the point of admission it is my clinical judgment that the patient will require inpatient hospital care spanning beyond 2 midnights from the point of admission due to high intensity of service, high risk for further deterioration and high frequency of surveillance required.*    Other information:   DVT prophylaxis: Lovenox ordered. Code Status: Full code. Family Communication: No family at the bedside.  Disposition Plan: Home when SBO relieved, unpredictable. Consults called: Surgery: Dr. Leighton Ruff. Admission status: Inpatient.   The medical decision making on this patient was of high complexity and the patient is at high risk for clinical deterioration, therefore this is a level 3 visit.   Rosabell Geyer [facility] Pager  [phone] Cell: [phone]   If 7PM-7AM, please contact night-coverage [email or web address] Password [email or web address] 10/22/2016, 11:20 AM

## 2016-10-22 NOTE — ED Notes (Signed)
NGT inserted right nare 26 cm mark secured per protocal by nasal device.  Aprrox  1 liter greenish brown liquid out  with suction initially and NGT placed to 20 cm  intermittent wall  Suction. Anthony Bolton tolerated well.

## 2016-10-22 NOTE — Progress Notes (Signed)
This is a no charge note  Pending admission per Dr. Nicholes Stairs  66 year old male with past medical history of hyperlipidemia, COPD, CHF, migraine headaches, who presents with nausea, vomiting and abdominal for 4 days. Found to have high-grade small bowel obstruction. Surgery was consulted. Dr. Marcello Moores will see pt in AM.   Ivor Costa, MD  [facility] Pager [phone]  If 7PM-7AM, please contact night-coverage [email or web address] Password [email or web address] 10/22/2016, 4:14 AM

## 2016-10-22 NOTE — ED Notes (Signed)
Patient pulled out second NG tube stating "I don't need that".

## 2016-10-22 NOTE — ED Notes (Addendum)
Patient removed NG tube stating that he was "tired of it".

## 2016-10-22 NOTE — ED Notes (Signed)
Patient has pulled out NG tube twice. Attempted to reinsert with off-going RN however patient refused and continues stating "I do not need or want that. Attempted to redirect and inform him of why he needed it only for him to proceed saying, "if you put it in I am going to pull it right back out."

## 2016-10-23 ENCOUNTER — Inpatient Hospital Stay (HOSPITAL_COMMUNITY): Payer: Medicare Other

## 2016-10-23 DIAGNOSIS — N179 Acute kidney failure, unspecified: Secondary | ICD-10-CM

## 2016-10-23 DIAGNOSIS — K56609 Unspecified intestinal obstruction, unspecified as to partial versus complete obstruction: Principal | ICD-10-CM

## 2016-10-23 DIAGNOSIS — I5022 Chronic systolic (congestive) heart failure: Secondary | ICD-10-CM

## 2016-10-23 DIAGNOSIS — I2510 Atherosclerotic heart disease of native coronary artery without angina pectoris: Secondary | ICD-10-CM

## 2016-10-23 DIAGNOSIS — E279 Disorder of adrenal gland, unspecified: Secondary | ICD-10-CM

## 2016-10-23 DIAGNOSIS — I2583 Coronary atherosclerosis due to lipid rich plaque: Secondary | ICD-10-CM

## 2016-10-23 DIAGNOSIS — E43 Unspecified severe protein-calorie malnutrition: Secondary | ICD-10-CM

## 2016-10-23 DIAGNOSIS — I7090 Unspecified atherosclerosis: Secondary | ICD-10-CM

## 2016-10-23 DIAGNOSIS — I700 Atherosclerosis of aorta: Secondary | ICD-10-CM

## 2016-10-23 LAB — BASIC METABOLIC PANEL
Anion gap: 15 (ref 5–15)
BUN: 50 mg/dL — ABNORMAL HIGH (ref 6–20)
CALCIUM: 8.6 mg/dL — AB (ref 8.9–10.3)
CO2: 22 mmol/L (ref 22–32)
CREATININE: 1 mg/dL (ref 0.61–1.24)
Chloride: 102 mmol/L (ref 101–111)
Glucose, Bld: 101 mg/dL — ABNORMAL HIGH (ref 65–99)
Potassium: 4.7 mmol/L (ref 3.5–5.1)
SODIUM: 139 mmol/L (ref 135–145)

## 2016-10-23 NOTE — Progress Notes (Signed)
Pt states that he does not feel like he needs to urinate at this time, states that he 'is fine' and 'leave me alone'. Patient educated on why he should not have a high volume of urine in his bladder. Patient refusing bladder scan at this time, patient refusing in and out cath at this time. No complaints of any abdominal pain or urge to void.  Will continue to monitor.  Anthony Bolton

## 2016-10-23 NOTE — Progress Notes (Signed)
Pt complained that staff took his cell phone from him.  Cell phone dialed and found under the fitted sheet by his head.  Roselind Rily

## 2016-10-23 NOTE — Progress Notes (Signed)
PROGRESS NOTE  Anthony Bolton  [id number] DOB: 1951/01/28  DOA: 10/21/2016 PCP: [name], MD   Brief Narrative:  Anthony Bolton is an 66 y.o. male with a PMH of prior appendectomy, COPD, chronic systolic CHF (EF [id number] percent in 2015), nonobstructive CAD per cardiac catheterization also in 2015, prior SBO that resolved with conservative management, was brought to the ED via EMS for evaluation of a 4 day history of worsening generalized abdominal pain associated with multiple episodes of vomiting. According to the ED physician's notes, the patient was not cooperative with answering any questions. He was noted to be vomiting bile. He did not engage admitting physician in conversation. Imaging study showed high-grade SBO. NG tube placed in ED with large volume bilious material drained but subsequently pulled out NG tube and refused to have it replaced. General surgery consulted. Baseline living situation and mental status not known-social worker consulted for assistance.   Assessment & Plan:   Principal Problem:   SBO (small bowel obstruction) Active Problems:   COPD (chronic obstructive pulmonary disease) (HCC)   Mass of adrenal gland (HCC)   Hyperthyroidism   Chronic systolic congestive heart failure, NYHA class 3 (HCC)   CAD (nonobstructive per cath 2015)   HTN (hypertension)   Nausea & vomiting   AKI (acute kidney injury) ([place])   Dehydration   Protein-calorie malnutrition, severe   Atheroma of artery   Aortic atherosclerosis ([place])   1. Small bowel obstruction: Presented with nonbloody emesis. CT abdomen showed SBO with transition point in RLQ. NG tube inserted in ED with copious bilious output, then he dislodged NJ tube and refuses to have it replaced including this a.m. Treating with bowel rest/NPO and IV fluids. KUB 1/26 shows ileus or distal SBO. Surgical consultation and follow-up appreciated. Continue NPO. Not a good surgical candidate. Follow KUB in a.m. 2. Chronic  systolic CHF: Clinically compensated. 3. CAD/abdominal aortic atherosclerotic disease with nonspecific calcified atheroma: Nonobstructive per 2015. Ideally should be on antiplatelet therapy after current acute presentation and statins but compliance may be an issue. 4. Acute kidney injury: Secondary to GI losses. Resolved. Continue IV fluids while nothing by mouth. 5. Severe protein calorie malnutrition: Dietitian consultation when able to take by mouth. 6. Mass of adrenal gland: Stable on imaging. 7. Hyperthyroid: Currently not being treated. Clinically appears euthyroid. 8. COPD: Stable without clinical bronchospasm. 9. Hard of hearing:   DVT prophylaxis: Lovenox Code Status: Full Family Communication: None at bedside Disposition Plan: DC home when medically stable.   Consultants:   General surgery  Procedures:   Dislodged NG tube that was placed in ED and refuses to have it replaced.  Antimicrobials:   None    Subjective: Seen this morning. Does not say much. Oriented to self.? Hard of hearing. Asking for ice water to drink. Denies pain. As per nursing, couple of large BM's overnight.  Objective:  Vitals:   10/22/16 2105 10/22/16 2117 10/23/16 0502 10/23/16 1352  BP:  (!) 101/91 119/78 (!) 89/66  Pulse:  (!) 102 (!) 101 90  Resp:  18 16 16   Temp:  99.4 F (37.4 C) 98.4 F (36.9 C) 97.5 F (36.4 C)  TempSrc:  Oral Oral Oral  SpO2: 91% 93% 96% 92%  Weight:      Height:        Intake/Output Summary (Last 24 hours) at 10/23/16 1410 Last data filed at 10/23/16 1352  Gross per 24 hour  Intake  500 ml  Output              500 ml  Net                0 ml   Filed Weights   10/22/16 0909  Weight: 48.4 kg (106 lb 11.2 oz)    Examination:  General exam: Small built, frail, chronically ill-looking middle-aged male lying comfortably culture in bed. Respiratory system: Clear to auscultation. Respiratory effort normal. Cardiovascular system: S1 & S2  heard, RRR. No JVD, murmurs, rubs, gallops or clicks. No pedal edema. Gastrointestinal system: Abdomen is nondistended, soft and nontender. Subumbilical midline laparotomy scar. No organomegaly or masses felt. Normal bowel sounds heard. Central nervous system: Alert and oriented to self only. No focal neurological deficits. Extremities: Symmetric 5 x 5 power. Skin: No rashes, lesions or ulcers Psychiatry: Judgement and insight appear impaired and unable to assess completely at this time.     Data Reviewed: I have personally reviewed following labs and imaging studies  CBC:  Recent Labs Lab 10/22/16 0016 10/22/16 0029  WBC 10.4  --   HGB 16.9 18.4*  HCT 48.9 54.0*  MCV 91.9  --   PLT 179  --    Basic Metabolic Panel:  Recent Labs Lab 10/22/16 0016 10/22/16 0029 10/23/16 0502  NA 131* 131* 139  K 4.8 4.7 4.7  CL 91* 93* 102  CO2 27  --  22  GLUCOSE 154* 155* 101*  BUN 42* 46* 50*  CREATININE 1.12 1.10 1.00  CALCIUM 10.0  --  8.6*   GFR: Estimated Creatinine Clearance: 49.7 mL/min (by C-G formula based on SCr of 1 mg/dL). Liver Function Tests:  Recent Labs Lab 10/22/16 0016  AST 32  ALT 22  ALKPHOS 73  BILITOT 1.7*  PROT 8.2*  ALBUMIN 4.5    Recent Labs Lab 10/22/16 0016  LIPASE 22   No results for input(s): AMMONIA in the last 168 hours. Coagulation Profile:  Recent Labs Lab 10/22/16 0458  INR 1.01   Cardiac Enzymes: No results for input(s): CKTOTAL, CKMB, CKMBINDEX, TROPONINI in the last 168 hours. BNP (last 3 results) No results for input(s): PROBNP in the last 8760 hours. HbA1C: No results for input(s): HGBA1C in the last 72 hours. CBG: No results for input(s): GLUCAP in the last 168 hours. Lipid Profile:  Recent Labs  10/22/16 0013  CHOL 219*  HDL 70  LDLCALC 129*  TRIG 101  CHOLHDL 3.1   Thyroid Function Tests: No results for input(s): TSH, T4TOTAL, FREET4, T3FREE, THYROIDAB in the last 72 hours. Anemia Panel: No results for  input(s): VITAMINB12, FOLATE, FERRITIN, TIBC, IRON, RETICCTPCT in the last 72 hours.  Sepsis Labs:  Recent Labs Lab 10/22/16 0016  WBC 10.4    No results found for this or any previous visit (from the past 240 hour(s)).       Radiology Studies: Dg Abd 1 View  Result Date: 10/23/2016 CLINICAL DATA:  Nausea. EXAM: ABDOMEN - 1 VIEW COMPARISON:  CT scan and radiographs of October 22, 2016. FINDINGS: Mildly dilated small bowel loops are again noted concerning for ileus or distal small bowel obstruction. No definite colonic dilatation is noted. No radio-opaque calculi or other significant radiographic abnormality are seen. IMPRESSION: Mildly dilated small bowel loops concerning for ileus or distal small bowel obstruction. Electronically Signed   By: Marijo Conception, M.D.   On: 10/23/2016 09:25   Ct Abdomen Pelvis W Contrast  Result Date: 10/22/2016 CLINICAL DATA:  66 year old male with abdominal pain, nausea vomiting. Concern for small bowel obstruction. EXAM: CT ABDOMEN AND PELVIS WITH CONTRAST TECHNIQUE: Multidetector CT imaging of the abdomen and pelvis was performed using the standard protocol following bolus administration of intravenous contrast. CONTRAST:  100 cc Isovue-300 COMPARISON:  Abdominal CT dated 12/03/2015 FINDINGS: Lower chest: The visualized lung bases are clear. No intra-abdominal free air or free fluid. Hepatobiliary: No focal liver abnormality is seen. No gallstones, gallbladder wall thickening, or biliary dilatation. Pancreas: Unremarkable. No pancreatic ductal dilatation or surrounding inflammatory changes. Spleen: Normal in size without focal abnormality. Adrenals/Urinary Tract: There is a 3.9 x 3.7 cm heterogeneously enhancing low attenuating left adrenal mass similar to prior CT previously described as adenoma. The right adrenal gland appears unremarkable. The kidneys, visualized ureters, and urinary bladder appear unremarkable. Stomach/Bowel: An enteric tube is noted with  tip in the proximal stomach. Multiple dilated and fluid-filled loops of small bowel noted throughout the abdomen measuring up to 6 cm in diameter. The distal and terminal ileum are collapsed. A transition zone is noted in the right hemiabdomen where there is swirling of the mesentery. Findings may be related to mesenteric volvulus or adhesions. Vascular/Lymphatic: There is calcified and noncalcified atheroma plaque along the course of the aorta. There has been significant interval progression of the noncalcified plaque along the course of the infrarenal abdominal aorta compared to the CT of 12/03/2015 suggestive of interval development of mural hemorrhage. There is approximately 50% narrowing of the aortic lumen at the level of L3 vertebra. The origins of the celiac axis, SMA, IMA as well as the origins of the renal arteries remain patent. The IVC is unremarkable. The SMV, visualized splenic vein, and main portal vein are patent. No portal venous gas identified. There is no adenopathy. Reproductive: Enlarged and heterogeneous prostate gland measuring 5.5 cm in transverse axial diameter. Other: There is loss of subcutaneous fat and cachexia. Musculoskeletal: No acute or significant osseous findings. IMPRESSION: High-grade small bowel obstruction with transition zone in the right hemiabdomen likely related to adhesions or mesenteric volvulus. No pneumatosis or portal venous gas identified. No free air. Significant interval progression of aortic atherosclerotic disease with noncalcified atheroma plaques along the course of the abdominal aorta. There is associated approximately 50% luminal narrowing at the level of L3 vertebra. Rapid progression since the CT of 12/03/2015 suggests interval mural hemorrhage. Stable heterogeneously enhancing left adrenal mass. Electronically Signed   By: Anner Crete M.D.   On: 10/22/2016 03:13   Dg Abd Acute W/chest  Result Date: 10/22/2016 CLINICAL DATA:  Initial evaluation for  acute vomiting. EXAM: DG ABDOMEN ACUTE W/ 1V CHEST COMPARISON:  Prior radiograph from 10/13/2016. FINDINGS: Cardiac and mediastinal silhouettes are stable in size and contour, and remain within normal limits. Scattered atheromatous plaque noted within the aortic arch. Lungs are hyperinflated with severe attenuation of the pulmonary markings, compatible with emphysema. No focal infiltrates identified. No pulmonary edema or definite pleural effusion. No definite pneumothorax, although evaluation mildly limited by technique. Multiple dilated gas and fluid filled loops of small bowel seen within the abdomen. These measure up to approximately 5 cm in diameter. Air-fluid level seen on decubitus view. Findings concerning for small bowel obstruction. There is seen within the transverse colon. No free air on lateral decubitus view. No appreciable soft tissue mass or abnormal calcification. No acute osseous abnormality. IMPRESSION: 1. Multiple dilated loops of small bowel with associated air-fluid levels, concerning for small bowel obstruction. This could be further assessed with dedicated cross-sectional  imaging of the abdomen. 2. Emphysema.  No other active cardiopulmonary disease identified. Electronically Signed   By: Jeannine Boga M.D.   On: 10/22/2016 01:44        Scheduled Meds: . enoxaparin (LOVENOX) injection  40 mg Subcutaneous Q24H  . mometasone-formoterol  2 puff Inhalation BID  . tiotropium  18 mcg Inhalation Daily   Continuous Infusions: . sodium chloride 125 mL/hr at 10/22/16 2256     LOS: 1 day     [facility], MD [facility] Pager [phone]  If 7PM-7AM, please contact night-coverage [email or web address] Password [email or web address] 10/23/2016, 2:10 PM

## 2016-10-23 NOTE — Progress Notes (Signed)
Patient ID: Anthony Bolton, male   DOB: [birth year], 66 y.o.   MRN: [id number]  [facility] Surgery Progress Note     Subjective: Denies any current abdominal pain, nausea, or vomiting. States that he is hungry. Had multiple BM's yesterday but denies passing any flatus. Continues to refuse NG tube.  Objective: Vital signs in last 24 hours: Temp:  [98.4 F (36.9 C)-99.4 F (37.4 C)] 98.4 F (36.9 C) (01/26 0502) Pulse Rate:  [96-102] 101 (01/26 0502) Resp:  [16-18] 16 (01/26 0502) BP: (101-119)/(78-91) 119/78 (01/26 0502) SpO2:  [91 %-96 %] 96 % (01/26 0502) Last BM Date: 10/23/16  Intake/Output from previous day: 01/25 0701 - 01/26 0700 In: 1250 [I.V.:1250] Out: 500 [Urine:500] Intake/Output this shift: No intake/output data recorded.  PE: Gen:  Alert, NAD, cooperative Pulm:  CTAB, no W/R/R, effort normal Abd: Soft, mild distension, hypoactive BS, no TTP Ext:  No erythema, edema, or tenderness   Lab Results:   Recent Labs  10/22/16 0016 10/22/16 0029  WBC 10.4  --   HGB 16.9 18.4*  HCT 48.9 54.0*  PLT 179  --    BMET  Recent Labs  10/22/16 0016 10/22/16 0029 10/23/16 0502  NA 131* 131* 139  K 4.8 4.7 4.7  CL 91* 93* 102  CO2 27  --  22  GLUCOSE 154* 155* 101*  BUN 42* 46* 50*  CREATININE 1.12 1.10 1.00  CALCIUM 10.0  --  8.6*   PT/INR  Recent Labs  10/22/16 0458  LABPROT 13.3  INR 1.01   CMP     Component Value Date/Time   NA 139 10/23/2016 0502   K 4.7 10/23/2016 0502   CL 102 10/23/2016 0502   CO2 22 10/23/2016 0502   GLUCOSE 101 (H) 10/23/2016 0502   BUN 50 (H) 10/23/2016 0502   CREATININE 1.00 10/23/2016 0502   CALCIUM 8.6 (L) 10/23/2016 0502   PROT 8.2 (H) 10/22/2016 0016   ALBUMIN 4.5 10/22/2016 0016   AST 32 10/22/2016 0016   ALT 22 10/22/2016 0016   ALKPHOS 73 10/22/2016 0016   BILITOT 1.7 (H) 10/22/2016 0016   GFRNONAA >60 10/23/2016 0502   GFRAA >60 10/23/2016 0502   Lipase     Component Value Date/Time    LIPASE 22 10/22/2016 0016       Studies/Results: Ct Abdomen Pelvis W Contrast  Result Date: 10/22/2016 CLINICAL DATA:  66 year old male with abdominal pain, nausea vomiting. Concern for small bowel obstruction. EXAM: CT ABDOMEN AND PELVIS WITH CONTRAST TECHNIQUE: Multidetector CT imaging of the abdomen and pelvis was performed using the standard protocol following bolus administration of intravenous contrast. CONTRAST:  100 cc Isovue-300 COMPARISON:  Abdominal CT dated 12/03/2015 FINDINGS: Lower chest: The visualized lung bases are clear. No intra-abdominal free air or free fluid. Hepatobiliary: No focal liver abnormality is seen. No gallstones, gallbladder wall thickening, or biliary dilatation. Pancreas: Unremarkable. No pancreatic ductal dilatation or surrounding inflammatory changes. Spleen: Normal in size without focal abnormality. Adrenals/Urinary Tract: There is a 3.9 x 3.7 cm heterogeneously enhancing low attenuating left adrenal mass similar to prior CT previously described as adenoma. The right adrenal gland appears unremarkable. The kidneys, visualized ureters, and urinary bladder appear unremarkable. Stomach/Bowel: An enteric tube is noted with tip in the proximal stomach. Multiple dilated and fluid-filled loops of small bowel noted throughout the abdomen measuring up to 6 cm in diameter. The distal and terminal ileum are collapsed. A transition zone is noted in the right hemiabdomen where there is  swirling of the mesentery. Findings may be related to mesenteric volvulus or adhesions. Vascular/Lymphatic: There is calcified and noncalcified atheroma plaque along the course of the aorta. There has been significant interval progression of the noncalcified plaque along the course of the infrarenal abdominal aorta compared to the CT of 12/03/2015 suggestive of interval development of mural hemorrhage. There is approximately 50% narrowing of the aortic lumen at the level of L3 vertebra. The origins of  the celiac axis, SMA, IMA as well as the origins of the renal arteries remain patent. The IVC is unremarkable. The SMV, visualized splenic vein, and main portal vein are patent. No portal venous gas identified. There is no adenopathy. Reproductive: Enlarged and heterogeneous prostate gland measuring 5.5 cm in transverse axial diameter. Other: There is loss of subcutaneous fat and cachexia. Musculoskeletal: No acute or significant osseous findings. IMPRESSION: High-grade small bowel obstruction with transition zone in the right hemiabdomen likely related to adhesions or mesenteric volvulus. No pneumatosis or portal venous gas identified. No free air. Significant interval progression of aortic atherosclerotic disease with noncalcified atheroma plaques along the course of the abdominal aorta. There is associated approximately 50% luminal narrowing at the level of L3 vertebra. Rapid progression since the CT of 12/03/2015 suggests interval mural hemorrhage. Stable heterogeneously enhancing left adrenal mass. Electronically Signed   By: Anner Crete M.D.   On: 10/22/2016 03:13   Dg Abd Acute W/chest  Result Date: 10/22/2016 CLINICAL DATA:  Initial evaluation for acute vomiting. EXAM: DG ABDOMEN ACUTE W/ 1V CHEST COMPARISON:  Prior radiograph from 10/13/2016. FINDINGS: Cardiac and mediastinal silhouettes are stable in size and contour, and remain within normal limits. Scattered atheromatous plaque noted within the aortic arch. Lungs are hyperinflated with severe attenuation of the pulmonary markings, compatible with emphysema. No focal infiltrates identified. No pulmonary edema or definite pleural effusion. No definite pneumothorax, although evaluation mildly limited by technique. Multiple dilated gas and fluid filled loops of small bowel seen within the abdomen. These measure up to approximately 5 cm in diameter. Air-fluid level seen on decubitus view. Findings concerning for small bowel obstruction. There is seen  within the transverse colon. No free air on lateral decubitus view. No appreciable soft tissue mass or abnormal calcification. No acute osseous abnormality. IMPRESSION: 1. Multiple dilated loops of small bowel with associated air-fluid levels, concerning for small bowel obstruction. This could be further assessed with dedicated cross-sectional imaging of the abdomen. 2. Emphysema.  No other active cardiopulmonary disease identified. Electronically Signed   By: Jeannine Boga M.D.   On: 10/22/2016 01:44    Anti-infectives: Anti-infectives    None       Assessment/Plan SBO - CT scan shows SBO with transition point in RLQ - NG inserted in ED with copious bilious output, patient refusing reinsertion NG - XR this AM shows mildly dilated small bowel loops concerning for ileus or distal small bowel obstruction  CHF - EF 20-25 percent in 2015 CAD COPD  ID - none FEN - IVF, NPO, refusing NG tube VTE - SCDs, lovenox  Plan - Patient continues to refuse NG tube. Recommend continuing NPO. Mobilize/PT. Not a good surgical candidate. Repeat XR in AM   LOS: 1 day    Jerrye Beavers , [facility] Surgery 10/23/2016, 9:26 AM Pager: [phone] Consults: [phone] Mon-Fri 7:00 am-4:30 pm Sat-Sun 7:00 am-11:30 am

## 2016-10-23 NOTE — Progress Notes (Signed)
Patient complaining of nausea; no vomiting noted at this time.  Educated patient on the benefits of NG tube, patient refused placement at this time.  Instructed to call if he changes his mind.  Call light within reach.

## 2016-10-23 NOTE — Progress Notes (Signed)
Pt only let me I/O cath him long enough to get out 269mL of urine.

## 2016-10-24 ENCOUNTER — Inpatient Hospital Stay (HOSPITAL_COMMUNITY): Payer: Medicare Other

## 2016-10-24 DIAGNOSIS — R338 Other retention of urine: Secondary | ICD-10-CM

## 2016-10-24 LAB — CBC
HEMATOCRIT: 33 % — AB (ref 39.0–52.0)
Hemoglobin: 11 g/dL — ABNORMAL LOW (ref 13.0–17.0)
MCH: 31.9 pg (ref 26.0–34.0)
MCHC: 33.3 g/dL (ref 30.0–36.0)
MCV: 95.7 fL (ref 78.0–100.0)
Platelets: 126 10*3/uL — ABNORMAL LOW (ref 150–400)
RBC: 3.45 MIL/uL — ABNORMAL LOW (ref 4.22–5.81)
RDW: 13.7 % (ref 11.5–15.5)
WBC: 8 10*3/uL (ref 4.0–10.5)

## 2016-10-24 MED ORDER — KETOROLAC TROMETHAMINE 15 MG/ML IJ SOLN
15.0000 mg | Freq: Once | INTRAMUSCULAR | Status: AC
  Administered 2016-10-24: 15 mg via INTRAVENOUS
  Filled 2016-10-24: qty 1

## 2016-10-24 NOTE — Progress Notes (Signed)
PROGRESS NOTE  CASSANOVA VINSON  [id number] DOB: 12-11-1950  DOA: 10/21/2016 PCP: [name], MD   Brief Narrative:  BOWEN MIJARES is an 66 y.o. male with a PMH of prior appendectomy, COPD, chronic systolic CHF (EF 123XX123 percent in 2015), nonobstructive CAD per cardiac catheterization also in 2015, prior SBO that resolved with conservative management, was brought to the ED via EMS for evaluation of a 4 day history of worsening generalized abdominal pain associated with multiple episodes of vomiting. According to the ED physician's notes, the patient was not cooperative with answering any questions. He was noted to be vomiting bile. He did not engage admitting physician in conversation. Imaging study showed high-grade SBO. NG tube placed in ED with large volume bilious material drained but subsequently pulled out NG tube and refused to have it replaced. General surgery consulted. Baseline living situation and mental status not known-social worker consulted for assistance.   Assessment & Plan:   Principal Problem:   SBO (small bowel obstruction) Active Problems:   COPD (chronic obstructive pulmonary disease) (HCC)   Mass of adrenal gland (HCC)   Hyperthyroidism   Chronic systolic congestive heart failure, NYHA class 3 (HCC)   CAD (nonobstructive per cath 2015)   HTN (hypertension)   Nausea & vomiting   AKI (acute kidney injury) ([place])   Dehydration   Protein-calorie malnutrition, severe   Atheroma of artery   Aortic atherosclerosis ([place])   1. Small bowel obstruction: Presented with nonbloody emesis. CT abdomen showed SBO with transition point in RLQ. NG tube inserted in ED with copious bilious output, then he dislodged NJ tube and refused to have it replaced. Treating with bowel rest/NPO and IV fluids. KUB 1/26 shows ileus or distal SBO. Surgical consultation and follow-up appreciated. Not a good surgical candidate. Clinically improving. Surgeons starting him on clear  liquids. 2. Chronic systolic CHF: Clinically compensated. 3. CAD/abdominal aortic atherosclerotic disease with nonspecific calcified atheroma: Nonobstructive per 2015. Ideally should be on antiplatelet therapy after current acute presentation and statins but compliance may be an issue. 4. Acute kidney injury: Secondary to GI losses. Resolved. Continue IV fluids while oral intake improves. 5. Severe protein calorie malnutrition: Dietitian consultation when able to take by mouth. 6. Mass of adrenal gland: Stable on imaging. 7. Hyperthyroid: Currently not being treated. Clinically appears euthyroid. 8. COPD: Stable without clinical bronchospasm. 9. Hard of hearing: 10. Anemia and thrombocytopenia: Unclear etiology. Some of his anemia may be dilutional. No bleeding reported. Follow CBCs. 11. Acute urinary retention: Noted 1/26. Patient did not cooperate fully with in and out catheterization. Since then has been voiding. Monitor.   DVT prophylaxis: Lovenox Code Status: Full Family Communication: None at bedside Disposition Plan: DC home when medically stable. Patient states that he lives in a motel and will not be able to return until he gets paid on 10/31/2016. Clinical social work input pending.   Consultants:   General surgery  Procedures:   Dislodged NG tube that was placed in ED and refuses to have it replaced.  Antimicrobials:   None    Subjective: No abdominal pain reported. Glad that he will be getting something to drink this morning. Overnight events noted, urinary retention, incomplete attempts to in and out. Has been voiding since.  Objective:  Vitals:   10/23/16 2011 10/23/16 2133 10/24/16 0531 10/24/16 1125  BP:  117/63 104/75   Pulse:  71 71   Resp:  16 16   Temp:  98 F (36.7 C) 98.8 F (  37.1 C)   TempSrc:  Oral Oral   SpO2: 95% 97% 97% 92%  Weight:      Height:        Intake/Output Summary (Last 24 hours) at 10/24/16 1451 Last data filed at 10/24/16  1200  Gross per 24 hour  Intake          2420.83 ml  Output             1000 ml  Net          1420.83 ml   Filed Weights   10/22/16 0909  Weight: 48.4 kg (106 lb 11.2 oz)    Examination:  General exam: Small built, frail, chronically ill-looking middle-aged male lying comfortably in bed. Respiratory system: Clear to auscultation. Respiratory effort normal. Cardiovascular system: S1 & S2 heard, RRR. No JVD, murmurs, rubs, gallops or clicks. No pedal edema. Gastrointestinal system: Abdomen is nondistended, soft and nontender. Subumbilical midline laparotomy scar. No organomegaly or masses felt. Normal bowel sounds heard. Central nervous system: Alert and oriented to self only. No focal neurological deficits. Extremely hard of hearing. Extremities: Symmetric 5 x 5 power. Skin: No rashes, lesions or ulcers Psychiatry: Judgement and insight appear impaired and unable to assess completely at this time.     Data Reviewed: I have personally reviewed following labs and imaging studies  CBC:  Recent Labs Lab 10/22/16 0016 10/22/16 0029 10/24/16 0510  WBC 10.4  --  8.0  HGB 16.9 18.4* 11.0*  HCT 48.9 54.0* 33.0*  MCV 91.9  --  95.7  PLT 179  --  123XX123*   Basic Metabolic Panel:  Recent Labs Lab 10/22/16 0016 10/22/16 0029 10/23/16 0502  NA 131* 131* 139  K 4.8 4.7 4.7  CL 91* 93* 102  CO2 27  --  22  GLUCOSE 154* 155* 101*  BUN 42* 46* 50*  CREATININE 1.12 1.10 1.00  CALCIUM 10.0  --  8.6*   GFR: Estimated Creatinine Clearance: 49.7 mL/min (by C-G formula based on SCr of 1 mg/dL). Liver Function Tests:  Recent Labs Lab 10/22/16 0016  AST 32  ALT 22  ALKPHOS 73  BILITOT 1.7*  PROT 8.2*  ALBUMIN 4.5    Recent Labs Lab 10/22/16 0016  LIPASE 22   No results for input(s): AMMONIA in the last 168 hours. Coagulation Profile:  Recent Labs Lab 10/22/16 0458  INR 1.01   Cardiac Enzymes: No results for input(s): CKTOTAL, CKMB, CKMBINDEX, TROPONINI in the  last 168 hours. BNP (last 3 results) No results for input(s): PROBNP in the last 8760 hours. HbA1C: No results for input(s): HGBA1C in the last 72 hours. CBG: No results for input(s): GLUCAP in the last 168 hours. Lipid Profile:  Recent Labs  10/22/16 0013  CHOL 219*  HDL 70  LDLCALC 129*  TRIG 101  CHOLHDL 3.1   Thyroid Function Tests: No results for input(s): TSH, T4TOTAL, FREET4, T3FREE, THYROIDAB in the last 72 hours. Anemia Panel: No results for input(s): VITAMINB12, FOLATE, FERRITIN, TIBC, IRON, RETICCTPCT in the last 72 hours.  Sepsis Labs:  Recent Labs Lab 10/22/16 0016 10/24/16 0510  WBC 10.4 8.0    No results found for this or any previous visit (from the past 240 hour(s)).       Radiology Studies: Dg Abd 1 View  Result Date: 10/23/2016 CLINICAL DATA:  Nausea. EXAM: ABDOMEN - 1 VIEW COMPARISON:  CT scan and radiographs of October 22, 2016. FINDINGS: Mildly dilated small bowel loops are again noted concerning  for ileus or distal small bowel obstruction. No definite colonic dilatation is noted. No radio-opaque calculi or other significant radiographic abnormality are seen. IMPRESSION: Mildly dilated small bowel loops concerning for ileus or distal small bowel obstruction. Electronically Signed   By: Marijo Conception, M.D.   On: 10/23/2016 09:25   Dg Abd Portable 1v  Result Date: 10/24/2016 CLINICAL DATA:  Small-bowel obstruction EXAM: PORTABLE ABDOMEN - 1 VIEW COMPARISON:  10/23/2016 FINDINGS: Gas-filled large and small bowel loops similar to yesterday. No bowel wall thickening. Upright view not obtained to evaluate for air-fluid level or free air. No renal calculi.  No acute skeletal abnormality IMPRESSION: Mildly distended large and small bowel loops unchanged most consistent with ileus. Electronically Signed   By: Franchot Gallo M.D.   On: 10/24/2016 06:39        Scheduled Meds: . enoxaparin (LOVENOX) injection  40 mg Subcutaneous Q24H  .  mometasone-formoterol  2 puff Inhalation BID  . tiotropium  18 mcg Inhalation Daily   Continuous Infusions: . sodium chloride 75 mL/hr (10/24/16 0423)     LOS: 2 days     [facility] MD [facility] Pager [phone]  If 7PM-7AM, please contact night-coverage [email or web address] Password [email or web address] 10/24/2016, 2:51 PM

## 2016-10-24 NOTE — Accreditation Note (Signed)
Patient voided 500 cc at 0100.  Woke up and wanted to urinate and he was able to do that without any problem.  Still no NGT.

## 2016-10-24 NOTE — Evaluation (Signed)
Physical Therapy Evaluation Patient Details Name: Anthony Bolton MRN: [id number] DOB: [birth year] Today's Date: 10/24/2016   History of Present Illness  Pt presents to ED with nausea, vomiting for 3-4 days.  He states worsening abdominal pain brought him to the ED.  He has a h/o an open appendectomy in the past.  NG inserted in ED with copious bilious output.  CT scan shows SBO with transition point in RLQ.  Pt has pulled out 2 NG tubes during hospital stay and won't allow any re-insertion  Clinical Impression  Pt admitted with above diagnosis. Pt currently with functional limitations due to the deficits listed below (see PT Problem List). Pt will benefit from skilled PT to increase their independence and safety with mobility to allow discharge to the venue listed below.  Due to [facility] and the fact that pt agitates easily, it was difficult to get clear PLOF, but it appears he lived in extended stay motel and ambulated without AD.  Pt ambulated with RW during eval, but would not let go of it to allow PT to assess gait without AD.  At this point recommend RW, but will continue to assess. He was able to ambulate 28' with RW and min/guard with o2 92% on room air with 2/4 dyspnea.  Do not feel he will need any post acute PT, but will follow acutely.     Follow Up Recommendations No PT follow up    Equipment Recommendations  Rolling walker with 5" wheels    Recommendations for Other Services       Precautions / Restrictions Precautions Precautions: Fall Precaution Comments: impulsive and HOH      Mobility  Bed Mobility Overal bed mobility: Modified Independent                Transfers Overall transfer level: Modified independent                  Ambulation/Gait Ambulation/Gait assistance: Min guard Ambulation Distance (Feet): 182 Feet Assistive device: Rolling walker (2 wheeled) Gait Pattern/deviations: Trunk flexed;Step-through pattern     General Gait Details: Pt  ambulated fairly steady with RW, although at end of gait appeared more fatigued with o2 sat 92% on RA.  Unable to assess ambulation as pt would not let go of it when PT encouraged him to.  Stairs            Wheelchair Mobility    Modified Rankin (Stroke Patients Only)       Balance Overall balance assessment: Needs assistance   Sitting balance-Leahy Scale: Good       Standing balance-Leahy Scale: Fair                               Pertinent Vitals/Pain Pain Assessment: Faces Faces Pain Scale: No hurt    Home Living Family/patient expects to be discharged to:: Other (Comment) (extended stay hotel)                      Prior Function Level of Independence: Independent         Comments: Pt guarded about PLOF and very HOH, but it seems he ambulated without AD as best PT could figure out.       Hand Dominance        Extremity/Trunk Assessment   Upper Extremity Assessment Upper Extremity Assessment: Overall WFL for tasks assessed    Lower Extremity Assessment Lower Extremity  Assessment: Overall WFL for tasks assessed       Communication   Communication: HOH  Cognition Arousal/Alertness: Awake/alert Behavior During Therapy: Impulsive;Agitated Overall Cognitive Status: Within Functional Limits for tasks assessed                 General Comments: Pt slightly agitated at times when PT trying to ask questions    General Comments General comments (skin integrity, edema, etc.): Pt with gauze in L ear.     Exercises     Assessment/Plan    PT Assessment Patient needs continued PT services  PT Problem List Decreased balance;Decreased activity tolerance;Decreased mobility;Decreased safety awareness          PT Treatment Interventions Gait training;Stair training;Functional mobility training;Therapeutic activities;Therapeutic exercise;Balance training;DME instruction    PT Goals (Current goals can be found in the Care Plan  section)  Acute Rehab PT Goals Patient Stated Goal: didn't state PT Goal Formulation: Patient unable to participate in goal setting Time For Goal Achievement: 11/07/16 Potential to Achieve Goals: Good    Frequency Min 3X/week   Barriers to discharge Decreased caregiver support      Co-evaluation               End of Session Equipment Utilized During Treatment: Gait belt Activity Tolerance: Patient tolerated treatment well Patient left: in bed;with call bell/phone within reach;with bed alarm set (refused to sit in chair) Nurse Communication: Mobility status         Time: [id number] PT Time Calculation (min) (ACUTE ONLY): 12 min   Charges:   PT Evaluation $PT Eval Low Complexity: 1 Procedure     PT G Codes:        Janeth Terry LUBECK 10/24/2016, 11:32 AM

## 2016-10-24 NOTE — Progress Notes (Signed)
Assessment Principal Problem:   SBO (small bowel obstruction)-clinically improving  Plan:  Clear liquids.   LOS: 2 days        Subjective: Passing gas and moving bowels.  Would like something to drink.  Objective: Vital signs in last 24 hours: Temp:  [97.5 F (36.4 C)-98.8 F (37.1 C)] 98.8 F (37.1 C) (01/27 0531) Pulse Rate:  [71-90] 71 (01/27 0531) Resp:  [16] 16 (01/27 0531) BP: (89-117)/(63-75) 104/75 (01/27 0531) SpO2:  [92 %-97 %] 97 % (01/27 0531) Last BM Date: 10/23/16  Intake/Output from previous day: 01/26 0701 - 01/27 0700 In: 1850.8 [I.V.:1850.8] Out: 700 [Urine:700] Intake/Output this shift: No intake/output data recorded.  PE: General- In NAD Abdomen-soft, flat, not tender  Lab Results:   Recent Labs  10/22/16 0016 10/22/16 0029 10/24/16 0510  WBC 10.4  --  8.0  HGB 16.9 18.4* 11.0*  HCT 48.9 54.0* 33.0*  PLT 179  --  126*   BMET  Recent Labs  10/22/16 0016 10/22/16 0029 10/23/16 0502  NA 131* 131* 139  K 4.8 4.7 4.7  CL 91* 93* 102  CO2 27  --  22  GLUCOSE 154* 155* 101*  BUN 42* 46* 50*  CREATININE 1.12 1.10 1.00  CALCIUM 10.0  --  8.6*   PT/INR  Recent Labs  10/22/16 0458  LABPROT 13.3  INR 1.01   Comprehensive Metabolic Panel:    Component Value Date/Time   NA 139 10/23/2016 0502   NA 131 (L) 10/22/2016 0029   K 4.7 10/23/2016 0502   K 4.7 10/22/2016 0029   CL 102 10/23/2016 0502   CL 93 (L) 10/22/2016 0029   CO2 22 10/23/2016 0502   CO2 27 10/22/2016 0016   BUN 50 (H) 10/23/2016 0502   BUN 46 (H) 10/22/2016 0029   CREATININE 1.00 10/23/2016 0502   CREATININE 1.10 10/22/2016 0029   GLUCOSE 101 (H) 10/23/2016 0502   GLUCOSE 155 (H) 10/22/2016 0029   CALCIUM 8.6 (L) 10/23/2016 0502   CALCIUM 10.0 10/22/2016 0016   AST 32 10/22/2016 0016   AST 29 08/13/2016 0035   ALT 22 10/22/2016 0016   ALT 16 (L) 08/13/2016 0035   ALKPHOS 73 10/22/2016 0016   ALKPHOS 46 08/13/2016 0035   BILITOT 1.7 (H) 10/22/2016 0016    BILITOT 0.8 08/13/2016 0035   PROT 8.2 (H) 10/22/2016 0016   PROT 6.8 08/13/2016 0035   ALBUMIN 4.5 10/22/2016 0016   ALBUMIN 4.6 08/13/2016 0035     Studies/Results: Dg Abd 1 View  Result Date: 10/23/2016 CLINICAL DATA:  Nausea. EXAM: ABDOMEN - 1 VIEW COMPARISON:  CT scan and radiographs of October 22, 2016. FINDINGS: Mildly dilated small bowel loops are again noted concerning for ileus or distal small bowel obstruction. No definite colonic dilatation is noted. No radio-opaque calculi or other significant radiographic abnormality are seen. IMPRESSION: Mildly dilated small bowel loops concerning for ileus or distal small bowel obstruction. Electronically Signed   By: Marijo Conception, M.D.   On: 10/23/2016 09:25   Dg Abd Portable 1v  Result Date: 10/24/2016 CLINICAL DATA:  Small-bowel obstruction EXAM: PORTABLE ABDOMEN - 1 VIEW COMPARISON:  10/23/2016 FINDINGS: Gas-filled large and small bowel loops similar to yesterday. No bowel wall thickening. Upright view not obtained to evaluate for air-fluid level or free air. No renal calculi.  No acute skeletal abnormality IMPRESSION: Mildly distended large and small bowel loops unchanged most consistent with ileus. Electronically Signed   By: Franchot Gallo M.D.  On: 10/24/2016 06:39    Anti-infectives: Anti-infectives    None       Araiya Tilmon J 10/24/2016

## 2016-10-24 NOTE — Progress Notes (Signed)
Physical therapy states they will see patient around noon, Social Work also states they are aware of patients case, and they will be in contact with patient either today or tomorrow.

## 2016-10-24 NOTE — Progress Notes (Signed)
Patient has orders in place for NG tube - upon morning assessment there is no NG tube in place and previous RN reports off during shift change that patient has not had an NG tube for the past 2 days and that he pulled 2 NG tubes out and refused to allow reinsertion of tube.

## 2016-10-25 LAB — CBC
HEMATOCRIT: 30.1 % — AB (ref 39.0–52.0)
Hemoglobin: 10.2 g/dL — ABNORMAL LOW (ref 13.0–17.0)
MCH: 31.4 pg (ref 26.0–34.0)
MCHC: 33.9 g/dL (ref 30.0–36.0)
MCV: 92.6 fL (ref 78.0–100.0)
Platelets: 126 10*3/uL — ABNORMAL LOW (ref 150–400)
RBC: 3.25 MIL/uL — ABNORMAL LOW (ref 4.22–5.81)
RDW: 13 % (ref 11.5–15.5)
WBC: 8.6 10*3/uL (ref 4.0–10.5)

## 2016-10-25 NOTE — Progress Notes (Signed)
  Subjective: Patient sleeping soundly - able to arouse No complaints - wants something to eat No nausea or vomiting Tolerating clears  Objective: Vital signs in last 24 hours: Temp:  [98 F (36.7 C)-98.6 F (37 C)] 98.6 F (37 C) (01/28 0457) Pulse Rate:  [77-78] 77 (01/28 0457) Resp:  [14-16] 14 (01/28 0457) BP: (109-114)/(74-75) 114/75 (01/28 0457) SpO2:  [92 %-98 %] 98 % (01/28 0457) Last BM Date: 10/23/16  Intake/Output from previous day: 01/27 0701 - 01/28 0700 In: [id number] [P.O.:360; I.V.:1325] Out: 925 [Urine:925] Intake/Output this shift: No intake/output data recorded.  General appearance: disheveled appearance; no distress GI: soft, non-tender; bowel sounds normal; no masses,  no organomegaly  Lab Results:   Recent Labs  10/24/16 0510 10/25/16 0511  WBC 8.0 8.6  HGB 11.0* 10.2*  HCT 33.0* 30.1*  PLT 126* 126*   BMET  Recent Labs  10/23/16 0502  NA 139  K 4.7  CL 102  CO2 22  GLUCOSE 101*  BUN 50*  CREATININE 1.00  CALCIUM 8.6*   PT/INR No results for input(s): LABPROT, INR in the last 72 hours. ABG No results for input(s): PHART, HCO3 in the last 72 hours.  Invalid input(s): PCO2, PO2  Studies/Results: Dg Abd 1 View  Result Date: 10/23/2016 CLINICAL DATA:  Nausea. EXAM: ABDOMEN - 1 VIEW COMPARISON:  CT scan and radiographs of October 22, 2016. FINDINGS: Mildly dilated small bowel loops are again noted concerning for ileus or distal small bowel obstruction. No definite colonic dilatation is noted. No radio-opaque calculi or other significant radiographic abnormality are seen. IMPRESSION: Mildly dilated small bowel loops concerning for ileus or distal small bowel obstruction. Electronically Signed   By: Marijo Conception, M.D.   On: 10/23/2016 09:25   Dg Abd Portable 1v  Result Date: 10/24/2016 CLINICAL DATA:  Small-bowel obstruction EXAM: PORTABLE ABDOMEN - 1 VIEW COMPARISON:  10/23/2016 FINDINGS: Gas-filled large and small bowel loops similar  to yesterday. No bowel wall thickening. Upright view not obtained to evaluate for air-fluid level or free air. No renal calculi.  No acute skeletal abnormality IMPRESSION: Mildly distended large and small bowel loops unchanged most consistent with ileus. Electronically Signed   By: Franchot Gallo M.D.   On: 10/24/2016 06:39    Anti-infectives: Anti-infectives    None      Assessment/Plan: SBO - appears to be resolving without surgery  Advance to full liquids Encourage ambulation   LOS: 3 days    Nohemy Koop K. 10/25/2016

## 2016-10-25 NOTE — Progress Notes (Signed)
PROGRESS NOTE  Anthony Bolton  [id number] DOB: 1951-04-16  DOA: 10/21/2016 PCP: [name], MD   Brief Narrative:  Anthony Bolton is an 66 y.o. male with a PMH of prior appendectomy, COPD, chronic systolic CHF (EF 123XX123 percent in 2015), nonobstructive CAD per cardiac catheterization also in 2015, prior SBO that resolved with conservative management, was brought to the ED via EMS for evaluation of a 4 day history of worsening generalized abdominal pain associated with multiple episodes of vomiting. According to the ED physician's notes, the patient was not cooperative with answering any questions. He was noted to be vomiting bile. He did not engage admitting physician in conversation. Imaging study showed high-grade SBO. NG tube placed in ED with large volume bilious material drained but subsequently pulled out NG tube and refused to have it replaced. General surgery consulted. Baseline living situation and mental status not known-social worker consulted for assistance.   Assessment & Plan:   Principal Problem:   SBO (small bowel obstruction) Active Problems:   COPD (chronic obstructive pulmonary disease) (HCC)   Mass of adrenal gland (HCC)   Hyperthyroidism   Chronic systolic congestive heart failure, NYHA class 3 (HCC)   CAD (nonobstructive per cath 2015)   HTN (hypertension)   Nausea & vomiting   AKI (acute kidney injury) ([place])   Dehydration   Protein-calorie malnutrition, severe   Atheroma of artery   Aortic atherosclerosis ([place])   1. Small bowel obstruction: Presented with nonbloody emesis. CT abdomen showed SBO with transition point in RLQ. NG tube inserted in ED with copious bilious output, then he dislodged NJ tube and refused to have it replaced. Treating with bowel rest/NPO and IV fluids. KUB 1/26 shows ileus or distal SBO. Surgical consultation and follow-up appreciated. Not a good surgical candidate. Clinically improving. Advancing diet to full liquids. Advance diet  as tolerated. 2. Chronic systolic CHF: Clinically compensated. 3. CAD/abdominal aortic atherosclerotic disease with nonspecific calcified atheroma: Nonobstructive per 2015. Ideally should be on antiplatelet therapy after current acute presentation and statins but compliance may be an issue. 4. Acute kidney injury: Secondary to GI losses. Resolved.  5. Severe protein calorie malnutrition: Dietitian consultation when able to take by mouth. 6. Mass of adrenal gland: Stable on imaging. 7. Hyperthyroid: Currently not being treated. Clinically appears euthyroid. 8. COPD: Stable without clinical bronchospasm. 9. Hard of hearing: 10. Anemia and thrombocytopenia: Unclear etiology. Some of his anemia may be dilutional. No bleeding reported. Follow CBCs. 11. Acute urinary retention: Noted 1/26. Patient did not cooperate fully with in and out catheterization. Since then has been voiding. Monitor.   DVT prophylaxis: Lovenox Code Status: Full Family Communication: None at bedside Disposition Plan: DC home when medically stable, possibly 1/29. Patient states that he lives in a motel and will not be able to return until he gets paid on 10/31/2016. Clinical social work input pending.   Consultants:   General surgery  Procedures:   Dislodged NG tube that was placed in ED and refuses to have it replaced.  Antimicrobials:   None    Subjective: Extremely hard of hearing. Asking for something to eat (sausages and hamburger). Denies abdominal pain. Passing flatus. No BM for 2 days. No nausea or vomiting. Denies difficulty urinating. As per RN, no acute issues.  Objective:  Vitals:   10/24/16 2122 10/25/16 0109 10/25/16 0457 10/25/16 1019  BP: 109/74  114/75   Pulse: 77  77   Resp: 14  14   Temp: 98 F (36.7  C)  98.6 F (37 C)   TempSrc: Oral  Oral   SpO2: 98% 95% 98% 95%  Weight:      Height:        Intake/Output Summary (Last 24 hours) at 10/25/16 1348 Last data filed at 10/25/16 1315   Gross per 24 hour  Intake             1355 ml  Output             1350 ml  Net                5 ml   Filed Weights   10/22/16 0909  Weight: 48.4 kg (106 lb 11.2 oz)    Examination:  General exam: Small built, frail, chronically ill-looking middle-aged male sitting up comfortably in chair having breakfast this morning. Respiratory system: Clear to auscultation. Respiratory effort normal. Cardiovascular system: S1 & S2 heard, RRR. No JVD, murmurs, rubs, gallops or clicks. No pedal edema. Gastrointestinal system: Abdomen is nondistended, soft and nontender. Subumbilical midline laparotomy scar. No organomegaly or masses felt. Normal bowel sounds heard. Central nervous system: Alert and oriented to person and place. No focal neurological deficits. Extremely hard of hearing. Extremities: Symmetric 5 x 5 power. Skin: No rashes, lesions or ulcers Psychiatry: Judgement and insight appear impaired and unable to assess completely at this time.     Data Reviewed: I have personally reviewed following labs and imaging studies  CBC:  Recent Labs Lab 10/22/16 0016 10/22/16 0029 10/24/16 0510 10/25/16 0511  WBC 10.4  --  8.0 8.6  HGB 16.9 18.4* 11.0* 10.2*  HCT 48.9 54.0* 33.0* 30.1*  MCV 91.9  --  95.7 92.6  PLT 179  --  126* 123XX123*   Basic Metabolic Panel:  Recent Labs Lab 10/22/16 0016 10/22/16 0029 10/23/16 0502  NA 131* 131* 139  K 4.8 4.7 4.7  CL 91* 93* 102  CO2 27  --  22  GLUCOSE 154* 155* 101*  BUN 42* 46* 50*  CREATININE 1.12 1.10 1.00  CALCIUM 10.0  --  8.6*   GFR: Estimated Creatinine Clearance: 49.7 mL/min (by C-G formula based on SCr of 1 mg/dL). Liver Function Tests:  Recent Labs Lab 10/22/16 0016  AST 32  ALT 22  ALKPHOS 73  BILITOT 1.7*  PROT 8.2*  ALBUMIN 4.5    Recent Labs Lab 10/22/16 0016  LIPASE 22   No results for input(s): AMMONIA in the last 168 hours. Coagulation Profile:  Recent Labs Lab 10/22/16 0458  INR 1.01   Cardiac  Enzymes: No results for input(s): CKTOTAL, CKMB, CKMBINDEX, TROPONINI in the last 168 hours. BNP (last 3 results) No results for input(s): PROBNP in the last 8760 hours. HbA1C: No results for input(s): HGBA1C in the last 72 hours. CBG: No results for input(s): GLUCAP in the last 168 hours. Lipid Profile: No results for input(s): CHOL, HDL, LDLCALC, TRIG, CHOLHDL, LDLDIRECT in the last 72 hours. Thyroid Function Tests: No results for input(s): TSH, T4TOTAL, FREET4, T3FREE, THYROIDAB in the last 72 hours. Anemia Panel: No results for input(s): VITAMINB12, FOLATE, FERRITIN, TIBC, IRON, RETICCTPCT in the last 72 hours.  Sepsis Labs:  Recent Labs Lab 10/22/16 0016 10/24/16 0510 10/25/16 0511  WBC 10.4 8.0 8.6    No results found for this or any previous visit (from the past 240 hour(s)).       Radiology Studies: Dg Abd Portable 1v  Result Date: 10/24/2016 CLINICAL DATA:  Small-bowel obstruction EXAM: PORTABLE ABDOMEN -  1 VIEW COMPARISON:  10/23/2016 FINDINGS: Gas-filled large and small bowel loops similar to yesterday. No bowel wall thickening. Upright view not obtained to evaluate for air-fluid level or free air. No renal calculi.  No acute skeletal abnormality IMPRESSION: Mildly distended large and small bowel loops unchanged most consistent with ileus. Electronically Signed   By: Franchot Gallo M.D.   On: 10/24/2016 06:39        Scheduled Meds: . enoxaparin (LOVENOX) injection  40 mg Subcutaneous Q24H  . mometasone-formoterol  2 puff Inhalation BID  . tiotropium  18 mcg Inhalation Daily   Continuous Infusions: . sodium chloride 50 mL/hr at 10/24/16 2015     LOS: 3 days     [facility], MD [facility] Pager [phone]  If 7PM-7AM, please contact night-coverage [email or web address] Password [email or web address] 10/25/2016, 1:48 PM

## 2016-10-26 DIAGNOSIS — I10 Essential (primary) hypertension: Secondary | ICD-10-CM

## 2016-10-26 DIAGNOSIS — J42 Unspecified chronic bronchitis: Secondary | ICD-10-CM

## 2016-10-26 LAB — CBC
HEMATOCRIT: 32.9 % — AB (ref 39.0–52.0)
HEMOGLOBIN: 11.4 g/dL — AB (ref 13.0–17.0)
MCH: 31.4 pg (ref 26.0–34.0)
MCHC: 34.7 g/dL (ref 30.0–36.0)
MCV: 90.6 fL (ref 78.0–100.0)
Platelets: 142 10*3/uL — ABNORMAL LOW (ref 150–400)
RBC: 3.63 MIL/uL — ABNORMAL LOW (ref 4.22–5.81)
RDW: 13 % (ref 11.5–15.5)
WBC: 11.5 10*3/uL — ABNORMAL HIGH (ref 4.0–10.5)

## 2016-10-26 MED ORDER — BISACODYL 10 MG RE SUPP: 10.0000 mg | Freq: Every day | RECTAL | Status: DC | PRN

## 2016-10-26 MED ORDER — ASPIRIN EC 81 MG PO TBEC: 81.0000 mg | DELAYED_RELEASE_TABLET | Freq: Every day | ORAL | 0 refills | Status: DC

## 2016-10-26 NOTE — Care Management Important Message (Signed)
Important Message  Patient Details  Name: Anthony Bolton MRN: [id number] Date of Birth: 1951/03/07   Medicare Important Message Given:  Yes    [name] 10/26/2016, 4:47 [place] Message  Patient Details  Name: Anthony Bolton MRN: [id number] Date of Birth: [birth year]   Medicare Important Message Given:  Yes    [name] 10/26/2016, 4:47 PM

## 2016-10-26 NOTE — Progress Notes (Signed)
Spoke with patient at bedside. States he plans to return to his extended stay hotel, he needs a taxi voucher to get there. He has a PCP Dr. Criss Rosales, will make appt prior to d/c if possible. He uses [facility], they deliver his medications, script should be sent to them. Contacted them and they have filled scripts as recently as a week ago written by Dr. Criss Rosales, they will deliver meds to patient. Patient is not longer active with [facility] due to change in Medicaid, spoke with [facility] [place] ([facility] [phone]), she is very familiar with the patient but no longer provides support d/t above. She was very helpful providing collateral information. Patient states he will get his check the first of Feb so he will have money to pay his bills and go the the doctor. He confirmed his cell phone and number as [phone], he doesn't have his charger but confirms it works. He states he had meals on wheels at one time but they quit coming after a recent hospital stay. Contacted [employer] at [phone] to see if we could restart that. Discussed with patient that he needs to f/u with audiology for hearing aides, per [facility] he has missed final appt x 2, he understands he needs to f/u. He agrees to [facility] f/u, contacted [place] and referral place.

## 2016-10-26 NOTE — Progress Notes (Signed)
Patient discharged via taxi to [facility] at [address]. [place], [place] [address].

## 2016-10-26 NOTE — Discharge Summary (Signed)
Physician Discharge Summary  Anthony Bolton [id number] DOB: [birth year]  PCP: Anthony Peers, MD  Admit date: 10/21/2016 Discharge date: 10/26/2016  Recommendations for Outpatient Follow-up:  1. Dr. Lucianne Bolton, PCP in one week with repeat labs (CBC & BMP).  Home Health: None Equipment/Devices: Bolton walker with 5 inch wheels.    Discharge Condition: Improved and stable.  CODE STATUS: Full  Diet recommendation: Heart healthy diet.  Discharge Diagnoses:  Principal Problem:   SBO (small bowel obstruction) Active Problems:   COPD (chronic obstructive pulmonary disease) (HCC)   Mass of adrenal gland (HCC)   Hyperthyroidism   Chronic systolic congestive heart failure, NYHA class 3 (HCC)   CAD (nonobstructive per cath 2015)   HTN (hypertension)   Nausea & vomiting   AKI (acute kidney injury) ([place])   Dehydration   Protein-calorie malnutrition, severe   Atheroma of artery   Aortic atherosclerosis ([place])   Brief/Interim Summary: Anthony Bolton an 66 y.o.malewith a PMH of prior appendectomy, COPD, chronic systolic CHF (EF 123XX123 percent in 2015), nonobstructive CAD per cardiac catheterization also in 2015, prior SBO that resolved with conservative management, was brought to the ED via EMS for evaluation of a 4 day history of worsening generalized abdominal pain associated with multiple episodes of vomiting. According to the ED physician's notes, the patient was not cooperative with answering any questions. He was noted to be vomiting bile. He did not engage admitting physician in conversation. Imaging study showed high-grade SBO. NG tube placed in ED with large volume bilious material drained but subsequently pulled out NG tube and refused to have it replaced. General surgery consulted.   Assessment & Plan:   1. Small bowel obstruction: Presented with nonbloody emesis. CT abdomen showed SBO with transition point in RLQ. NG tube inserted in ED with copious bilious output, then  he dislodged NJ tube and refused to have it replaced. Treated conservatively with bowel rest/NPO and IV fluids. KUB 1/26 shows ileus or distal SBO. Surgical consultation and follow-up appreciated. Not a good surgical candidate. Patient had a BM's and passing flatus. Diet was gradually advanced which she has tolerated. He has tolerated regular diet today without nausea, vomiting or abdominal pain. Surgery has cleared him for discharge home. 2. Chronic systolic CHF: Clinically compensated. Not on diuretics prior to admission. 3. CAD/abdominal aortic atherosclerotic disease with nonspecific calcified atheroma: Nonobstructive per 2015. Ideally should be on antiplatelet therapy after current acute presentation and statins but compliance may be an issue. Started enteric-coated aspirin 81 MG daily at discharge. Consider statins during outpatient follow-up. 4. Acute kidney injury: Secondary to GI losses. Resolved.  5. Severe protein calorie malnutrition:  patient's prior to admission poor social support limits adequately addressing this problem. 6. Mass of adrenal gland: Stable on imaging. 7. Hyperthyroid: Currently not being treated. Clinically appears euthyroid. 8. COPD: Stable without clinical bronchospasm. Continue prior to admission home medications. 9. Hard of hearing: As per discussion with case management, patient has missed multiple appointments for outpatient Audiology evaluation. Patient was advised to pursue this with his PCPs assistance. 10. Anemia and thrombocytopenia: Unclear etiology. Some of his anemia may be dilutional. No bleeding reported. Stable. Follow CBC in a week's time as outpatient. 11. Acute urinary retention: Noted 1/26. Patient did not cooperate fully with in and out catheterization. Since then has been voiding. Resolved. 12. Adult failure to thrive: As per case management, patient stays at an extended stay hotel and plans to return there after discharge. He follows up with his  listed PCP above. Case management will attempt to make appointment with PCP prior to discharge. He stated that he had Meals on Wheels at one time but they quit coming after a recent hospital stay. Case management attempting to restart the Meals on Wheels. Patient understands need to follow-up with outpatient audiology. He also agreed to TAH and follow-up. Clinical social worker also evaluated patient. Patient has a son but not very active in patient's life. He has a male friend who assists him at times. He was provided with a taxi voucher to return home. [facility] evaluated patient and has been assigned to a community [facility] RNCM as well as [facility] LCSW for another evaluation and assistance as outpatient.     Consultants:   General surgery  Procedures:   Dislodged NG tube that was placed in ED and refuses to have it replaced.   Discharge Instructions  Discharge Instructions    AMB Referral to [facility]    Complete by:  As directed    Please assign to [facility] and [facility] LCSW. Written consent obtained. Discharging back to hotel- Anthony Bolton- today 10/26/16. High risk for readmit. Please see liaison notes. Needs assist with transportation, meals on wheels. Very hard of hearing. History of COPD, CHF. Visits could possibly take place at PCP office. Please see inpatient RNCM notes as well for further details as well. Thanks and please call with questions. Anthony Bolton, [place], [facility] Liaison-[phone]   Reason for consult:  Please assign to [facility] RNCM and [facility] LCSW   Diagnoses of:   COPD/ Pneumonia Heart Failure     Expected date of contact:  1-3 days (reserved for hospital discharges)   Call MD for:    Complete by:  As directed    Difficulty urinating.   Call MD for:  difficulty breathing, headache or visual disturbances    Complete by:  As directed    Call MD for:  extreme fatigue    Complete by:  As directed    Call MD for:  persistant  dizziness or light-headedness    Complete by:  As directed    Call MD for:  persistant nausea and vomiting    Complete by:  As directed    Call MD for:  severe uncontrolled pain    Complete by:  As directed    Diet - low sodium heart healthy    Complete by:  As directed    Increase activity slowly    Complete by:  As directed        Medication List    STOP taking these medications   doxycycline 100 MG tablet Commonly known as:  VIBRA-TABS   feeding supplement (ENSURE ENLIVE) Liqd   levofloxacin 750 MG tablet Commonly known as:  LEVAQUIN     TAKE these medications   albuterol 108 (90 Base) MCG/ACT inhaler Commonly known as:  PROVENTIL HFA;VENTOLIN HFA Inhale 2 puffs into the lungs every 4 (four) hours as needed for wheezing or shortness of breath.   aspirin EC 81 MG tablet Take 1 tablet (81 mg total) by mouth daily.   budesonide-formoterol 160-4.5 MCG/ACT inhaler Commonly known as:  SYMBICORT Inhale 2 puffs into the lungs 2 (two) times daily.   ipratropium-albuterol 0.5-2.5 (3) MG/3ML Soln Commonly known as:  DUONEB Take 3 mLs by nebulization every 6 (six) hours as needed (wheezing, shortness of breath).   tiotropium 18 MCG inhalation capsule Commonly known as:  SPIRIVA Place 1 capsule (18 mcg  total) into inhaler and inhale daily.      Follow-up Information    Anthony Peers, MD. Schedule an appointment as soon as possible for a visit in 1 week(s).   Specialty:  Family Medicine Why:  To be seen with repeat labs (CBC & BMP). Contact information: [address] [place] [address] [phone]          No Known Allergies    Procedures/Studies: Dg Chest 2 View  Result Date: 10/13/2016 CLINICAL DATA:  Progressive shortness of breath.  Cough. EXAM: CHEST  2 VIEW COMPARISON:  10/06/2016 FINDINGS: Minimal ill-defined patchy opacity in the right middle lobe is new from prior exam. Lungs are hyperinflated in with apical predominant emphysema. Normal  heart size and mediastinal contours. No definite pleural effusion, blunting the costophrenic angles secondary to hyperinflation. No pneumothorax. IMPRESSION: No minimal ill-defined patchy opacity in the right middle lobe combined new from prior exam, suspicious for pneumonia. Emphysema. Electronically Signed   By: Jeb Levering M.D.   On: 10/13/2016 22:43   Dg Abd 1 View  Result Date: 10/23/2016 CLINICAL DATA:  Nausea. EXAM: ABDOMEN - 1 VIEW COMPARISON:  CT scan and radiographs of October 22, 2016. FINDINGS: Mildly dilated small bowel loops are again noted concerning for ileus or distal small bowel obstruction. No definite colonic dilatation is noted. No radio-opaque calculi or other significant radiographic abnormality are seen. IMPRESSION: Mildly dilated small bowel loops concerning for ileus or distal small bowel obstruction. Electronically Signed   By: Marijo Conception, M.D.   On: 10/23/2016 09:25   Ct Abdomen Pelvis W Contrast  Result Date: 10/22/2016 CLINICAL DATA:  66 year old male with abdominal pain, nausea vomiting. Concern for small bowel obstruction. EXAM: CT ABDOMEN AND PELVIS WITH CONTRAST TECHNIQUE: Multidetector CT imaging of the abdomen and pelvis was performed using the standard protocol following bolus administration of intravenous contrast. CONTRAST:  100 cc Isovue-300 COMPARISON:  Abdominal CT dated 12/03/2015 FINDINGS: Lower chest: The visualized lung bases are clear. No intra-abdominal free air or free fluid. Hepatobiliary: No focal liver abnormality is seen. No gallstones, gallbladder wall thickening, or biliary dilatation. Pancreas: Unremarkable. No pancreatic ductal dilatation or surrounding inflammatory changes. Spleen: Normal in size without focal abnormality. Adrenals/Urinary Tract: There is a 3.9 x 3.7 cm heterogeneously enhancing low attenuating left adrenal mass similar to prior CT previously described as adenoma. The right adrenal gland appears unremarkable. The kidneys,  visualized ureters, and urinary bladder appear unremarkable. Stomach/Bowel: An enteric tube is noted with tip in the proximal stomach. Multiple dilated and fluid-filled loops of small bowel noted throughout the abdomen measuring up to 6 cm in diameter. The distal and terminal ileum are collapsed. A transition zone is noted in the right hemiabdomen where there is swirling of the mesentery. Findings may be related to mesenteric volvulus or adhesions. Vascular/Lymphatic: There is calcified and noncalcified atheroma plaque along the course of the aorta. There has been significant interval progression of the noncalcified plaque along the course of the infrarenal abdominal aorta compared to the CT of 12/03/2015 suggestive of interval development of mural hemorrhage. There is approximately 50% narrowing of the aortic lumen at the level of L3 vertebra. The origins of the celiac axis, SMA, IMA as well as the origins of the renal arteries remain patent. The IVC is unremarkable. The SMV, visualized splenic vein, and main portal vein are patent. No portal venous gas identified. There is no adenopathy. Reproductive: Enlarged and heterogeneous prostate gland measuring 5.5 cm in transverse  axial diameter. Other: There is loss of subcutaneous fat and cachexia. Musculoskeletal: No acute or significant osseous findings. IMPRESSION: High-grade small bowel obstruction with transition zone in the right hemiabdomen likely related to adhesions or mesenteric volvulus. No pneumatosis or portal venous gas identified. No free air. Significant interval progression of aortic atherosclerotic disease with noncalcified atheroma plaques along the course of the abdominal aorta. There is associated approximately 50% luminal narrowing at the level of L3 vertebra. Rapid progression since the CT of 12/03/2015 suggests interval mural hemorrhage. Stable heterogeneously enhancing left adrenal mass. Electronically Signed   By: Anner Crete M.D.   On:  10/22/2016 03:13   Dg Chest Portable 1 View  Result Date: 10/06/2016 CLINICAL DATA:  Respiratory distress EXAM: PORTABLE CHEST 1 VIEW COMPARISON:  09/19/2016 FINDINGS: The lungs are hyperinflated. There is no focal infiltrate, consolidation or effusion. Stable cardiomediastinal silhouette. No pneumothorax. IMPRESSION: Hyperinflation without focal infiltrate Electronically Signed   By: Donavan Foil M.D.   On: 10/06/2016 17:59   Dg Abd Acute W/chest  Result Date: 10/22/2016 CLINICAL DATA:  Initial evaluation for acute vomiting. EXAM: DG ABDOMEN ACUTE W/ 1V CHEST COMPARISON:  Prior radiograph from 10/13/2016. FINDINGS: Cardiac and mediastinal silhouettes are stable in size and contour, and remain within normal limits. Scattered atheromatous plaque noted within the aortic arch. Lungs are hyperinflated with severe attenuation of the pulmonary markings, compatible with emphysema. No focal infiltrates identified. No pulmonary edema or definite pleural effusion. No definite pneumothorax, although evaluation mildly limited by technique. Multiple dilated gas and fluid filled loops of small bowel seen within the abdomen. These measure up to approximately 5 cm in diameter. Air-fluid level seen on decubitus view. Findings concerning for small bowel obstruction. There is seen within the transverse colon. No free air on lateral decubitus view. No appreciable soft tissue mass or abnormal calcification. No acute osseous abnormality. IMPRESSION: 1. Multiple dilated loops of small bowel with associated air-fluid levels, concerning for small bowel obstruction. This could be further assessed with dedicated cross-sectional imaging of the abdomen. 2. Emphysema.  No other active cardiopulmonary disease identified. Electronically Signed   By: Jeannine Boga M.D.   On: 10/22/2016 01:44   Dg Abd Portable 1v  Result Date: 10/24/2016 CLINICAL DATA:  Small-bowel obstruction EXAM: PORTABLE ABDOMEN - 1 VIEW COMPARISON:   10/23/2016 FINDINGS: Gas-filled large and small bowel loops similar to yesterday. No bowel wall thickening. Upright view not obtained to evaluate for air-fluid level or free air. No renal calculi.  No acute skeletal abnormality IMPRESSION: Mildly distended large and small bowel loops unchanged most consistent with ileus. Electronically Signed   By: Franchot Gallo M.D.   On: 10/24/2016 06:39      Subjective: Feels well. Denies complaints. Had 1 BM recorded last night. Tolerating soft diet without abdominal pain, nausea or vomiting. Voiding without urinary difficulties. As per RN, no acute issues.  Discharge Exam:  Vitals:   10/25/16 1416 10/25/16 2106 10/26/16 0431 10/26/16 0840  BP: 115/83  127/80   Pulse: 70  91 62  Resp: 16  18 17   Temp: 98.5 F (36.9 C)  99.5 F (37.5 C)   TempSrc: Oral  Oral   SpO2: 98% 96% 99% 97%  Weight:      Height:        General exam: Small built, frail, chronically ill-looking middle-aged male sitting up comfortably in chair. Respiratory system: Clear to auscultation. Respiratory effort normal. Cardiovascular system: S1 & S2 heard, RRR. No JVD, murmurs, rubs, gallops or  clicks. No pedal edema. Gastrointestinal system: Abdomen is nondistended, soft and nontender. Subumbilical midline laparotomy scar. No organomegaly or masses felt. Normal bowel sounds heard. Central nervous system: Alert and oriented. No focal neurological deficits. Extremely hard of hearing in left ear. Extremities: Symmetric 5 x 5 power. Skin: No rashes, lesions or ulcers Psychiatry: Judgement and insight appear impaired.     The results of significant diagnostics from this hospitalization (including imaging, microbiology, ancillary and laboratory) are listed below for reference.     Microbiology: No results found for this or any previous visit (from the past 240 hour(s)).   Labs: BNP (last 3 results)  Recent Labs  11/20/15 0738 01/07/16 1939 10/13/16 2046  BNP 46.0 32.2  [id number]   Basic Metabolic Panel:  Recent Labs Lab 10/22/16 0016 10/22/16 0029 10/23/16 0502  NA 131* 131* 139  K 4.8 4.7 4.7  CL 91* 93* 102  CO2 27  --  22  GLUCOSE 154* 155* 101*  BUN 42* 46* 50*  CREATININE 1.12 1.10 1.00  CALCIUM 10.0  --  8.6*   Liver Function Tests:  Recent Labs Lab 10/22/16 0016  AST 32  ALT 22  ALKPHOS 73  BILITOT 1.7*  PROT 8.2*  ALBUMIN 4.5    Recent Labs Lab 10/22/16 0016  LIPASE 22   CBC:  Recent Labs Lab 10/22/16 0016 10/22/16 0029 10/24/16 0510 10/25/16 0511 10/26/16 0423  WBC 10.4  --  8.0 8.6 11.5*  HGB 16.9 18.4* 11.0* 10.2* 11.4*  HCT 48.9 54.0* 33.0* 30.1* 32.9*  MCV 91.9  --  95.7 92.6 90.6  PLT 179  --  126* 126* 142*      Time coordinating discharge: Over 30 minutes  SIGNED:  Vernell Leep, MD, FACP, FHM. [facility] Pager [phone]  If 7PM-7AM, please contact night-coverage [email or web address] Password [email or web address] 10/26/2016, 2:20 PM

## 2016-10-26 NOTE — Clinical Social Work Note (Addendum)
Clinical Social Work Assessment  Patient Details  Name: Anthony Bolton MRN: [id number] Date of Birth: Nov 26, 1950  Date of referral:  10/26/16               Reason for consult:  Discharge Planning                Permission sought to share information with:    Permission granted to share information::     Name::      Darden Dates, [place]::     Relationship::   Ramond Craver  Contact Information:   [phone], [phone],   Housing/Transportation Living arrangements for the past 2 months:   (Extended Stay) Source of Information:  Patient Patient Interpreter Needed:  None Criminal Activity/Legal Involvement Pertinent to Current Situation/Hospitalization:  No - Comment as needed Significant Relationships:  Adult Children Lives with:  Self Do you feel safe going back to the place where you live?  Yes Need for family participation in patient care:  Yes (Comment)  Care giving concerns: Inquire about patient living situation and assist with transportation    Facilities manager / plan:  LCSWA met with patient at bedside, pt. Agreeable to talk with LCSWA. Patient has difficulties with hearing. Patient reports he has been living at [facility] extended stay for a year. Patient plans to return upon discharge. Patient report his son lives in [place] about an hour away and is not very active in patient life.LCSWA called patient friend Peter Congo listed in chart, she reports she is familiar with patient, known him all her life. She has helped patient out in the past but has not so in a while. She reports he usually get his other friends to assist. She plans to follow up with patient at discharge.   Patient reports he will need a Chief Strategy Officer home, [place] left voucher at front desk with nurse.  Per notes RNCM has assisted with home care and patient follow up appointment.   Employment status:  Unemployed Forensic scientist:  Medicare PT Recommendations:  Not assessed at this  time Information / Referral to community resources:     Patient/Family's Response to care: Agreeable and responding well to care. Patient reports he is waiting to have a bowel movement before discharge.   Patient/Family's Understanding of and Emotional Response to Diagnosis, Current Treatment, and Prognosis:  No family at bedside.   Emotional Assessment Appearance:  Appears stated age Attitude/Demeanor/Rapport:   (Pt. has difficulites hearing ) Affect (typically observed):  Accepting Orientation:  Oriented to Self, Oriented to Place, Oriented to  Time, Oriented to Situation Alcohol / Substance use:  Not Applicable Psych involvement (Current and /or in the community):  No (Comment)  Discharge Needs  Concerns to be addressed:  Discharge Planning Concerns Readmission within the last 30 days:  No Current discharge risk:  None Barriers to Discharge:  No Barriers Identified   Lia Hopping, [place] 10/26/2016, 12:20 PM

## 2016-10-26 NOTE — Progress Notes (Signed)
This patient is Not eligible for [facility] Care Management Services.     Reason: Not a beneficiary currently attributed to one of the [place]. Membership roster used to verify non- eligible status.    I have submitted request with IT to have [place] removed from [facility] chart as it incorrect.   Per [facility]

## 2016-10-26 NOTE — Progress Notes (Signed)
Pt been c/o cramping in both his legs.  Gave 2 mg morphine times 2.  Will continue to monitor

## 2016-10-26 NOTE — Discharge Instructions (Addendum)
Small Bowel Obstruction °A small bowel obstruction is a blockage in the small bowel. The small bowel, which is also called the small intestine, is a long, slender tube that connects the stomach to the colon. When a person eats and drinks, food and fluids go from the stomach to the small bowel. This is where most of the nutrients in the food and fluids are absorbed. °A small bowel obstruction will prevent food and fluids from passing through the small bowel as they normally do during digestion. The small bowel can become partially or completely blocked. This can cause symptoms such as abdominal pain, vomiting, and bloating. If this condition is not treated, it can be dangerous because the small bowel could rupture. °What are the causes? °Common causes of this condition include: °· Scar tissue from previous surgery or radiation treatment. °· Recent surgery. This may cause the movements of the bowel to slow down and cause food to block the intestine. °· Hernias. °· Inflammatory bowel disease (colitis). °· Twisting of the bowel (volvulus). °· Tumors. °· A foreign body. °· Slipping of a part of the bowel into another part (intussusception). °What are the signs or symptoms? °Symptoms of this condition include: °· Abdominal pain. This may be dull cramps or sharp pain. It may occur in one area, or it may be present in the entire abdomen. Pain can range from mild to severe, depending on the degree of obstruction. °· Nausea and vomiting. Vomit may be greenish or a yellow bile color. °· Abdominal bloating. °· Constipation. °· Lack of passing gas. °· Frequent belching. °· Diarrhea. This may occur if the obstruction is partial and runny stool is able to leak around the obstruction. °How is this diagnosed? °This condition may be diagnosed based on a physical exam, medical history, and X-rays of the abdomen. You may also have other tests, such as a CT scan of the abdomen and pelvis. °How is this treated? °Treatment for this  condition depends on the cause and severity of the problem. Treatment options may include: °· Bed rest along with fluids and pain medicines that are given through an IV tube inserted into one of your veins. Sometimes, this is all that is needed for the obstruction to improve. °· Following a simple diet. In some cases, a clear liquid diet may be required for several days. This allows the bowel to rest. °· Placement of a small tube (nasogastric tube) into the stomach. When the bowel is blocked, it usually swells up like a balloon that is filled with air and fluids. The air and fluids may be removed by suction through the nasogastric tube. This can help with pain, discomfort, and nausea. It can also help the obstruction to clear up faster. °· Surgery. This may be required if other treatments do not work. Bowel obstruction from a hernia may require early surgery and can be an emergency procedure. Surgery may also be required for scar tissue that causes frequent or severe obstructions. °Follow these instructions at home: °· Get plenty of rest. °· Follow instructions from your health care provider about eating restrictions. You may need to avoid solid foods and consume only clear liquids until your condition improves. °· Take over-the-counter and prescription medicines only as told by your health care provider. °· Keep all follow-up visits as told by your health care provider. This is important. °Contact a health care provider if: °· You have a fever. °· You have chills. °Get help right away if: °· You have increased   pain or cramping.  You vomit blood.  You have uncontrolled vomiting or nausea.  You cannot drink fluids because of vomiting or pain.  You develop confusion.  You begin feeling very dry or thirsty (dehydrated).  You have severe bloating.  You feel extremely weak or you faint. This information is not intended to replace advice given to you by your health care provider. Make sure you discuss any  questions you have with your health care provider. Document Released: 12/01/2005 Document Revised: 05/11/2016 Document Reviewed: 11/08/2014 Elsevier Interactive Patient Education  2017 [employer].   Additional discharge instructions:  Please get your medications reviewed and adjusted by your Primary MD.  Please request your Primary MD to go over all Hospital Tests and Procedure/Radiological results at the follow up, please get all Hospital records sent to your Prim MD by signing hospital release before you go home.  If you had Pneumonia of Lung problems at the Hospital: Please get a 2 view Chest X ray done in 6-8 weeks after hospital discharge or sooner if instructed by your Primary MD.  If you have Congestive Heart Failure: Please call your Cardiologist or Primary MD anytime you have any of the following symptoms:  1) 3 pound weight gain in 24 hours or 5 pounds in 1 week  2) shortness of breath, with or without a dry hacking cough  3) swelling in the hands, feet or stomach  4) if you have to sleep on extra pillows at night in order to breathe  Follow cardiac low salt diet and 1.5 lit/day fluid restriction.  If you have diabetes Accuchecks 4 times/day, Once in AM empty stomach and then before each meal. Log in all results and show them to your primary doctor at your next visit. If any glucose reading is under 80 or above 300 call your primary MD immediately.  If you have Seizure/Convulsions/Epilepsy: Please do not drive, operate heavy machinery, participate in activities at heights or participate in high speed sports until you have seen by Primary MD or a Neurologist and advised to do so again.  If you had Gastrointestinal Bleeding: Please ask your Primary MD to check a complete blood count within one week of discharge or at your next visit. Your endoscopic/colonoscopic biopsies that are pending at the time of discharge, will also need to followed by your Primary MD.  Get  Medicines reviewed and adjusted. Please take all your medications with you for your next visit with your Primary MD  Please request your Primary MD to go over all hospital tests and procedure/radiological results at the follow up, please ask your Primary MD to get all Hospital records sent to his/her office.  If you experience worsening of your admission symptoms, develop shortness of breath, life threatening emergency, suicidal or homicidal thoughts you must seek medical attention immediately by calling 911 or calling your MD immediately  if symptoms less severe.  You must read complete instructions/literature along with all the possible adverse reactions/side effects for all the Medicines you take and that have been prescribed to you. Take any new Medicines after you have completely understood and accpet all the possible adverse reactions/side effects.   Do not drive or operate heavy machinery when taking Pain medications.   Do not take more than prescribed Pain, Sleep and Anxiety Medications  Special Instructions: If you have smoked or chewed Tobacco  in the last 2 yrs please stop smoking, stop any regular Alcohol  and or any Recreational drug use.  Wear  Seat belts while driving.  Please note You were cared for by a hospitalist during your hospital stay. If you have any questions about your discharge medications or the care you received while you were in the hospital after you are discharged, you can call the unit and asked to speak with the hospitalist on call if the hospitalist that took care of you is not available. Once you are discharged, your primary care physician will handle any further medical issues. Please note that NO REFILLS for any discharge medications will be authorized once you are discharged, as it is imperative that you return to your primary care physician (or establish a relationship with a primary care physician if you do not have one) for your aftercare needs so that they  can reassess your need for medications and monitor your lab values.  You can reach the hospitalist office at phone [phone] or fax [phone]   If you do not have a primary care physician, you can call [phone] for a physician referral.

## 2016-10-26 NOTE — Consult Note (Signed)
   [facility] Inpatient Consult   10/26/2016  BURNS SKLUZACEK 1951/02/10 [id number]   Confirmed with [place] Management office that [place] banner is incorrect and Mr. Bleazard is not eligible after all for [facility] Care Management services. Inpatient RNCM was also made aware by [place] Management office. Called into patient's room and asked his nurse to please relay message to Mr. Pawelski that [place] Management cannot follow after all due to him not being in the [place] registry.   Marthenia Rolling, MSN-Ed, RN,BSN [facility] Liaison [phone]

## 2016-10-26 NOTE — Progress Notes (Signed)
  Subjective: No changes overnight No complaints - wants something to eat No nausea or vomiting Tolerating fulls  Objective: Vital signs in last 24 hours: Temp:  [98.5 F (36.9 C)-99.5 F (37.5 C)] 99.5 F (37.5 C) (01/29 0431) Pulse Rate:  [62-91] 62 (01/29 0840) Resp:  [16-18] 17 (01/29 0840) BP: (115-127)/(80-83) 127/80 (01/29 0431) SpO2:  [95 %-99 %] 97 % (01/29 0840) Last BM Date: 10/23/16  Intake/Output from previous day: 01/28 0701 - 01/29 0700 In: 660 [P.O.:660] Out: 3225 [Urine:3225] Intake/Output this shift: No intake/output data recorded.  General appearance: disheveled appearance; no distress GI: soft, non-tender; bowel sounds normal; no masses,  no organomegaly  Lab Results:   Recent Labs  10/25/16 0511 10/26/16 0423  WBC 8.6 11.5*  HGB 10.2* 11.4*  HCT 30.1* 32.9*  PLT 126* 142*   BMET No results for input(s): NA, K, CL, CO2, GLUCOSE, BUN, CREATININE, CALCIUM in the last 72 hours. PT/INR No results for input(s): LABPROT, INR in the last 72 hours. ABG No results for input(s): PHART, HCO3 in the last 72 hours.  Invalid input(s): PCO2, PO2  Studies/Results: No results found.  Anti-infectives: Anti-infectives    None      Assessment/Plan: SBO - resolving with non-operative management  Advance to soft diet Encourage ambulation From surgical standpoint he is nearly ready for discharge   LOS: 4 days    Clovis Riley 10/26/2016

## 2016-10-26 NOTE — Consult Note (Signed)
   [facility] CM Inpatient Consult   10/26/2016  SCOTTE GILDEA Aug 11, 1951 [id number]     Received call from inpatient RNCM for [place] Management referral. Reports Mr. Both is a high risk for readmission. He could use assistance with transportation, meals, and disease and symptom management. Vinnie Level, inpatient [facility], indicates Mr. Carraher is very hard of hearing. He has had several appointments for hearing aides but has missed the appointments. Transportation likely an issue for him for his appointments. Inpatient RNCM asks that [place] Management also follow up on meals on wheels for Mr. Broderick as she has called [employer] to request restart of services. Please see inpatient RNCM notes for further details.   Spoke with Mr. Andreoli nurse prior to bedside visit who reiterated writer needs to speak loudly when speaking with patient. Mr. Hoffecker was resting upon bedside visit, but was very receptive to discuss [place] Management program. He was agreeable and [facility] Care Management written consent was obtained.   He confirms best contact number is [phone]. Confirms Primary Care MD is Dr Criss Rosales. Endorses he lives at the [employer]. Obtains his medications thru [facility]. Endorses he uses a taxi to appointments so transportation is an issue for him as well as meals. Discussed that possible visits could take place at his MD appointments. He is agreeable to this. Mr. Pinales was very receptive and appreciative of the follow up . Explained that writer will make referral to [place] as well as [facility] LCSW.  [facility] Care Management packet provided along with contact information. Will make inpatient RNCM aware that consent was obtained for [facility] Care Management.   Will request to be assigned to [place] as well as [facility] LCSW.   Marthenia Rolling, MSN-Ed, RN,BSN [facility] Liaison [phone]

## 2016-11-06 ENCOUNTER — Encounter (HOSPITAL_COMMUNITY): Payer: Self-pay | Admitting: *Deleted

## 2016-11-06 ENCOUNTER — Emergency Department (HOSPITAL_COMMUNITY)
Admission: EM | Admit: 2016-11-06 | Discharge: 2016-11-06 | Disposition: A | Discharge status: Home / Self Care | Payer: Medicare Other | Attending: Emergency Medicine | Admitting: Emergency Medicine

## 2016-11-06 DIAGNOSIS — R1030 Lower abdominal pain, unspecified: Secondary | ICD-10-CM | POA: Insufficient documentation

## 2016-11-06 DIAGNOSIS — K2970 Gastritis, unspecified, without bleeding: Secondary | ICD-10-CM | POA: Diagnosis not present

## 2016-11-06 DIAGNOSIS — Z5321 Procedure and treatment not carried out due to patient leaving prior to being seen by health care provider: Secondary | ICD-10-CM | POA: Insufficient documentation

## 2016-11-06 DIAGNOSIS — R1111 Vomiting without nausea: Secondary | ICD-10-CM | POA: Diagnosis not present

## 2016-11-06 NOTE — ED Notes (Signed)
Called patient name twice in lobby to collect labs and no responce

## 2016-11-06 NOTE — ED Triage Notes (Signed)
Per EMS, pt complains of lower abdominal pain and distension. Pt states pain has been going on for 3 days. Pt states he vomited today. Pt states he has not eaten in 2 days. CBG 119.

## 2016-11-06 NOTE — ED Notes (Signed)
Called pt for room placement no response. 

## 2016-12-03 ENCOUNTER — Emergency Department (HOSPITAL_COMMUNITY): Payer: Medicare Other

## 2016-12-03 ENCOUNTER — Inpatient Hospital Stay (HOSPITAL_COMMUNITY)
Admission: EM | Admit: 2016-12-03 | Discharge: 2016-12-10 | DRG: 441 | Disposition: A | Discharge status: Skilled Nursing Facility | Payer: Medicare Other | Attending: Internal Medicine | Admitting: Internal Medicine

## 2016-12-03 ENCOUNTER — Encounter (HOSPITAL_COMMUNITY): Payer: Self-pay

## 2016-12-03 DIAGNOSIS — J441 Chronic obstructive pulmonary disease with (acute) exacerbation: Secondary | ICD-10-CM | POA: Diagnosis present

## 2016-12-03 DIAGNOSIS — R748 Abnormal levels of other serum enzymes: Secondary | ICD-10-CM | POA: Diagnosis present

## 2016-12-03 DIAGNOSIS — I2510 Atherosclerotic heart disease of native coronary artery without angina pectoris: Secondary | ICD-10-CM | POA: Diagnosis present

## 2016-12-03 DIAGNOSIS — Z681 Body mass index (BMI) 19 or less, adult: Secondary | ICD-10-CM

## 2016-12-03 DIAGNOSIS — R778 Other specified abnormalities of plasma proteins: Secondary | ICD-10-CM

## 2016-12-03 DIAGNOSIS — Z79899 Other long term (current) drug therapy: Secondary | ICD-10-CM

## 2016-12-03 DIAGNOSIS — R945 Abnormal results of liver function studies: Secondary | ICD-10-CM | POA: Diagnosis not present

## 2016-12-03 DIAGNOSIS — J449 Chronic obstructive pulmonary disease, unspecified: Secondary | ICD-10-CM | POA: Diagnosis present

## 2016-12-03 DIAGNOSIS — T391X4A Poisoning by 4-Aminophenol derivatives, undetermined, initial encounter: Secondary | ICD-10-CM

## 2016-12-03 DIAGNOSIS — E872 Acidosis, unspecified: Secondary | ICD-10-CM

## 2016-12-03 DIAGNOSIS — E860 Dehydration: Secondary | ICD-10-CM | POA: Diagnosis present

## 2016-12-03 DIAGNOSIS — I959 Hypotension, unspecified: Secondary | ICD-10-CM | POA: Diagnosis not present

## 2016-12-03 DIAGNOSIS — D61818 Other pancytopenia: Secondary | ICD-10-CM | POA: Diagnosis not present

## 2016-12-03 DIAGNOSIS — K7200 Acute and subacute hepatic failure without coma: Secondary | ICD-10-CM | POA: Diagnosis not present

## 2016-12-03 DIAGNOSIS — Z9049 Acquired absence of other specified parts of digestive tract: Secondary | ICD-10-CM

## 2016-12-03 DIAGNOSIS — F17210 Nicotine dependence, cigarettes, uncomplicated: Secondary | ICD-10-CM | POA: Diagnosis present

## 2016-12-03 DIAGNOSIS — R0902 Hypoxemia: Secondary | ICD-10-CM

## 2016-12-03 DIAGNOSIS — R079 Chest pain, unspecified: Secondary | ICD-10-CM | POA: Diagnosis not present

## 2016-12-03 DIAGNOSIS — K759 Inflammatory liver disease, unspecified: Secondary | ICD-10-CM

## 2016-12-03 DIAGNOSIS — R7989 Other specified abnormal findings of blood chemistry: Secondary | ICD-10-CM

## 2016-12-03 DIAGNOSIS — R0602 Shortness of breath: Secondary | ICD-10-CM

## 2016-12-03 DIAGNOSIS — Z720 Tobacco use: Secondary | ICD-10-CM | POA: Diagnosis present

## 2016-12-03 DIAGNOSIS — I429 Cardiomyopathy, unspecified: Secondary | ICD-10-CM | POA: Diagnosis not present

## 2016-12-03 DIAGNOSIS — E43 Unspecified severe protein-calorie malnutrition: Secondary | ICD-10-CM | POA: Diagnosis present

## 2016-12-03 DIAGNOSIS — D649 Anemia, unspecified: Secondary | ICD-10-CM

## 2016-12-03 DIAGNOSIS — Z7982 Long term (current) use of aspirin: Secondary | ICD-10-CM

## 2016-12-03 DIAGNOSIS — R946 Abnormal results of thyroid function studies: Secondary | ICD-10-CM

## 2016-12-03 DIAGNOSIS — I10 Essential (primary) hypertension: Secondary | ICD-10-CM | POA: Diagnosis present

## 2016-12-03 DIAGNOSIS — Z823 Family history of stroke: Secondary | ICD-10-CM

## 2016-12-03 DIAGNOSIS — S36119A Unspecified injury of liver, initial encounter: Secondary | ICD-10-CM | POA: Diagnosis present

## 2016-12-03 DIAGNOSIS — Z9119 Patient's noncompliance with other medical treatment and regimen: Secondary | ICD-10-CM

## 2016-12-03 DIAGNOSIS — R740 Nonspecific elevation of levels of transaminase and lactic acid dehydrogenase [LDH]: Secondary | ICD-10-CM | POA: Diagnosis present

## 2016-12-03 DIAGNOSIS — H9190 Unspecified hearing loss, unspecified ear: Secondary | ICD-10-CM

## 2016-12-03 DIAGNOSIS — R1310 Dysphagia, unspecified: Secondary | ICD-10-CM | POA: Diagnosis present

## 2016-12-03 DIAGNOSIS — K7290 Hepatic failure, unspecified without coma: Secondary | ICD-10-CM

## 2016-12-03 DIAGNOSIS — T391X1A Poisoning by 4-Aminophenol derivatives, accidental (unintentional), initial encounter: Secondary | ICD-10-CM | POA: Diagnosis present

## 2016-12-03 HISTORY — DX: Benign neoplasm of left adrenal gland: D35.02

## 2016-12-03 HISTORY — DX: Thrombocytopenia, unspecified: D69.6

## 2016-12-03 HISTORY — DX: Benign prostatic hyperplasia without lower urinary tract symptoms: N40.0

## 2016-12-03 HISTORY — DX: Unspecified severe protein-calorie malnutrition: E43

## 2016-12-03 HISTORY — DX: Anemia, unspecified: D64.9

## 2016-12-03 HISTORY — DX: Unspecified intestinal obstruction, unspecified as to partial versus complete obstruction: K56.609

## 2016-12-03 LAB — CBC WITH DIFFERENTIAL/PLATELET
BASOS ABS: 0 10*3/uL (ref 0.0–0.1)
BASOS PCT: 0 %
EOS PCT: 0 %
Eosinophils Absolute: 0 10*3/uL (ref 0.0–0.7)
HCT: 38 % — ABNORMAL LOW (ref 39.0–52.0)
Hemoglobin: 13 g/dL (ref 13.0–17.0)
LYMPHS PCT: 5 %
Lymphs Abs: 0.6 10*3/uL — ABNORMAL LOW (ref 0.7–4.0)
MCH: 29.7 pg (ref 26.0–34.0)
MCHC: 34.2 g/dL (ref 30.0–36.0)
MCV: 86.8 fL (ref 78.0–100.0)
Monocytes Absolute: 0.2 10*3/uL (ref 0.1–1.0)
Monocytes Relative: 1 %
Neutro Abs: 11.2 10*3/uL — ABNORMAL HIGH (ref 1.7–7.7)
Neutrophils Relative %: 94 %
PLATELETS: 157 10*3/uL (ref 150–400)
RBC: 4.38 MIL/uL (ref 4.22–5.81)
RDW: 14.2 % (ref 11.5–15.5)
WBC: 12 10*3/uL — AB (ref 4.0–10.5)

## 2016-12-03 LAB — COMPREHENSIVE METABOLIC PANEL
ALK PHOS: 136 U/L — AB (ref 38–126)
ALT: 4493 U/L — AB (ref 17–63)
AST: 1744 U/L — ABNORMAL HIGH (ref 15–41)
Albumin: 3.2 g/dL — ABNORMAL LOW (ref 3.5–5.0)
Anion gap: 14 (ref 5–15)
BUN: 60 mg/dL — AB (ref 6–20)
CO2: 19 mmol/L — ABNORMAL LOW (ref 22–32)
Calcium: 8.5 mg/dL — ABNORMAL LOW (ref 8.9–10.3)
Chloride: 102 mmol/L (ref 101–111)
Creatinine, Ser: 0.93 mg/dL (ref 0.61–1.24)
GFR calc non Af Amer: >60 mL/min (ref >60)
Glucose, Bld: 100 mg/dL — ABNORMAL HIGH (ref 65–99)
Potassium: 4 mmol/L (ref 3.5–5.1)
SODIUM: 135 mmol/L (ref 135–145)
TOTAL PROTEIN: 5.8 g/dL — AB (ref 6.5–8.1)
Total Bilirubin: 2.1 mg/dL — ABNORMAL HIGH (ref 0.3–1.2)

## 2016-12-03 LAB — I-STAT TROPONIN, ED
Troponin i, poc: 0.09 ng/mL (ref 0.00–0.08)
Troponin i, poc: 0.09 ng/mL (ref 0.00–0.08)

## 2016-12-03 LAB — PROTIME-INR
INR: 1.53
Prothrombin Time: 18.5 seconds — ABNORMAL HIGH (ref 11.4–15.2)

## 2016-12-03 LAB — URINALYSIS, ROUTINE W REFLEX MICROSCOPIC
BILIRUBIN URINE: NEGATIVE
GLUCOSE, UA: NEGATIVE mg/dL
KETONES UR: 5 mg/dL — AB
LEUKOCYTES UA: NEGATIVE
NITRITE: NEGATIVE
PH: 6 (ref 5.0–8.0)
Protein, ur: NEGATIVE mg/dL
RBC / HPF: NONE SEEN RBC/hpf (ref 0–5)
SQUAMOUS EPITHELIAL / LPF: NONE SEEN
Specific Gravity, Urine: 1.026 (ref 1.005–1.030)

## 2016-12-03 LAB — I-STAT CG4 LACTIC ACID, ED
Lactic Acid, Venous: 1.52 mmol/L (ref 0.5–1.9)
Lactic Acid, Venous: 2.18 mmol/L (ref 0.5–1.9)

## 2016-12-03 LAB — BLOOD GAS, VENOUS
Acid-base deficit: 5.6 mmol/L — ABNORMAL HIGH (ref 0.0–2.0)
BICARBONATE: 18.9 mmol/L — AB (ref 20.0–28.0)
O2 Content: 2 L/min
O2 Saturation: 84.2 %
PCO2 VEN: 35.3 mmHg — AB (ref 44.0–60.0)
PH VEN: 7.347 (ref 7.250–7.430)
PO2 VEN: 53.8 mmHg — AB (ref 32.0–45.0)
Patient temperature: 98.3

## 2016-12-03 LAB — BRAIN NATRIURETIC PEPTIDE: B Natriuretic Peptide: 342.8 pg/mL — ABNORMAL HIGH (ref 0.0–100.0)

## 2016-12-03 LAB — ACETAMINOPHEN LEVEL: Acetaminophen (Tylenol), Serum: 72 ug/mL — ABNORMAL HIGH (ref 10–30)

## 2016-12-03 MED ORDER — SODIUM CHLORIDE 0.9 % IV BOLUS (SEPSIS)
1000.0000 mL | Freq: Once | INTRAVENOUS | Status: AC
  Administered 2016-12-03: 1000 mL via INTRAVENOUS

## 2016-12-03 MED ORDER — SODIUM CHLORIDE 0.9 % IV BOLUS (SEPSIS)
500.0000 mL | Freq: Once | INTRAVENOUS | Status: AC
  Administered 2016-12-03: 500 mL via INTRAVENOUS

## 2016-12-03 MED ORDER — SODIUM CHLORIDE 0.9 % IV SOLN
500.0000 mg | Freq: Two times a day (BID) | INTRAVENOUS | Status: DC
  Filled 2016-12-03: qty 500

## 2016-12-03 MED ORDER — DEXTROSE 5 % IV SOLN
15.0000 mg/kg/h | INTRAVENOUS | Status: DC
  Administered 2016-12-04: 15 mg/kg/h via INTRAVENOUS
  Filled 2016-12-03: qty 150

## 2016-12-03 MED ORDER — DEXTROSE 5 % IV SOLN
1.0000 g | Freq: Three times a day (TID) | INTRAVENOUS | Status: DC
  Administered 2016-12-04: 1 g via INTRAVENOUS
  Filled 2016-12-03 (×2): qty 1

## 2016-12-03 MED ORDER — ASPIRIN 81 MG PO CHEW
324.0000 mg | CHEWABLE_TABLET | Freq: Once | ORAL | Status: AC
  Administered 2016-12-03: 324 mg via ORAL
  Filled 2016-12-03: qty 4

## 2016-12-03 MED ORDER — IOPAMIDOL (ISOVUE-370) INJECTION 76%
INTRAVENOUS | Status: AC
  Filled 2016-12-03: qty 100

## 2016-12-03 MED ORDER — DEXTROSE 5 % IV SOLN
2.0000 g | Freq: Once | INTRAVENOUS | Status: AC
  Administered 2016-12-03: 2 g via INTRAVENOUS
  Filled 2016-12-03: qty 2

## 2016-12-03 MED ORDER — VANCOMYCIN HCL IN DEXTROSE 1-5 GM/200ML-% IV SOLN
1000.0000 mg | Freq: Once | INTRAVENOUS | Status: AC
Start: 2016-12-03 — End: 2016-12-03
  Administered 2016-12-03: 1000 mg via INTRAVENOUS
  Filled 2016-12-03: qty 200

## 2016-12-03 MED ORDER — ACETYLCYSTEINE LOAD VIA INFUSION
150.0000 mg/kg | Freq: Once | INTRAVENOUS | Status: AC
  Administered 2016-12-04: 7830 mg via INTRAVENOUS
  Filled 2016-12-03: qty 196

## 2016-12-03 MED ORDER — IOPAMIDOL (ISOVUE-370) INJECTION 76%
100.0000 mL | Freq: Once | INTRAVENOUS | Status: AC | PRN
  Administered 2016-12-03: 100 mL via INTRAVENOUS

## 2016-12-03 MED ORDER — IPRATROPIUM BROMIDE 0.02 % IN SOLN
1.5000 mg | Freq: Once | RESPIRATORY_TRACT | Status: AC
  Administered 2016-12-03: 1.5 mg via RESPIRATORY_TRACT
  Filled 2016-12-03: qty 7.5

## 2016-12-03 MED ORDER — ALBUTEROL (5 MG/ML) CONTINUOUS INHALATION SOLN
10.0000 mg/h | INHALATION_SOLUTION | Freq: Once | RESPIRATORY_TRACT | Status: AC
  Administered 2016-12-03: 10 mg/h via RESPIRATORY_TRACT
  Filled 2016-12-03: qty 20

## 2016-12-03 NOTE — ED Triage Notes (Signed)
Per EMS patient is experiencing shortness of breath.  EMS was called to the [facility] twice today due to the patients shortness of breath.  Patient was given Albuterol.  Patient has a history of COPD.  Upon auscultation lungs were clear and diminished.

## 2016-12-03 NOTE — Progress Notes (Signed)
Pharmacy Antibiotic Note  Anthony Bolton is a 66 y.o. male admitted on 12/03/2016 with HCAP.  Pharmacy has been consulted for cefepime/vancomycin dosing.   Pt is a11 year old male presents with concern for chest pain, shortness of breath, and dysuria.    Plan:  Vancomycin 1000 mg IV x1, then 500 mg IV q12h, target trough 15-20  Cefepime 2 gr IV x1, then cefepime 1 gr IV q8h  Monitor clinical course, renal function, cultures as available   Height: 5\' 9"  (175.3 cm) Weight: 115 lb (52.2 kg) IBW/kg (Calculated) : 70.7  Temp (24hrs), Avg:97.6 F (36.4 C), Min:97.6 F (36.4 C), Max:97.6 F (36.4 C)   Recent Labs Lab 12/03/16 2010  WBC 12.0*  CREATININE 0.93  LATICACIDVEN 2.18*    Estimated Creatinine Clearance: 57.7 mL/min (by C-G formula based on SCr of 0.93 mg/dL).    No Known Allergies  Antimicrobials this admission: 3/8 cefepime >>  3/8 vancomycin >>   Dose adjustments this admission: ---  Microbiology results: 3/8 BCx: sent   Thank you for allowing pharmacy to be a part of this patient's care.  Anthony Bolton, PharmD, BCPS Pager [phone] 12/03/2016 9:45 PM

## 2016-12-03 NOTE — Progress Notes (Addendum)
MEDICATION RELATED CONSULT NOTE - INITIAL   Pharmacy Consult for Acetadote Indication: Tylenol overdose  No Known Allergies  Patient Measurements: Height: 5\' 9"  (175.3 cm) Weight: 115 lb (52.2 kg) IBW/kg (Calculated) : 70.7   Vital Signs: Temp: 97.6 F (36.4 C) (03/08 1821) Temp Source: Oral (03/08 1821) BP: 120/91 (03/08 2254) Pulse Rate: 119 (03/08 2254) Intake/Output from previous day: No intake/output data recorded. Intake/Output from this shift: Total I/O In: 1500 [IV Piggyback:1500] Out: -   Labs:  Recent Labs  12/03/16 2010  WBC 12.0*  HGB 13.0  HCT 38.0*  PLT 157  CREATININE 0.93  ALBUMIN 3.2*  PROT 5.8*  AST 1,744*  ALT 4,493*  ALKPHOS 136*  BILITOT 2.1*   Estimated Creatinine Clearance: 57.7 mL/min (by C-G formula based on SCr of 0.93 mg/dL).   Microbiology: No results found for this or any previous visit (from the past 720 hour(s)).  Medical History: Past Medical History:  Diagnosis Date  . CHF (congestive heart failure) (HCC) EF 20 - 25% in 2015  . COPD (chronic obstructive pulmonary disease) ([place])   . HLD (hyperlipidemia)   . HOH (hard of hearing)   . Migraines     Medications:  Scheduled:  . acetylcysteine  150 mg/kg Intravenous Once  . iopamidol       Infusions:  . [START ON 12/04/2016] acetylcysteine    . [START ON 12/04/2016] ceFEPime (MAXIPIME) IV    . [START ON 12/04/2016] vancomycin      Assessment: 45 yoM c/o CP, SOB and dysuria.  Labs reveal AST=1744, ALT=4493, Tbill=2.1 and apap=72. Patient denies using tylenol. Goal of Therapy:  Prevent liver toxicity  Plan:  Acetadote 150 mg/kg over 1 hour then 15 mg/kg/hr F/u LFTs/Tbili/INR and apap levels and PC recommendations  Dorrene German 12/03/2016,11:58 PM

## 2016-12-03 NOTE — ED Notes (Signed)
Asked pt if he could give a urine sample. Pt stated he is not able to, but will let us know.

## 2016-12-03 NOTE — ED Provider Notes (Addendum)
[facility] DEPT Provider Note   CSN: [id number] Arrival date & time: 12/03/16  1758     History   Chief Complaint Chief Complaint  Patient presents with  . Shortness of Breath  . Chest Pain  . Dysuria    HPI Anthony Bolton is a 66 y.o. male.  HPI   66 year old male presents with concern for chest pain, shortness of breath, and dysuria. Patient is a poor historian, with poor hearing, and is slow to answer questions and difficult to understand.  Reports having chest pain and shortness of breath starting last night. Also noted some history of cough. Denies known fevers. Has had dysuria for 2 days.  Reports nausea and vomiting.  Reports he has RUQ abdominal pain with deep breaths. Denies etoh use, new medications, drugs. Denies using tylenol, however unclear if he is history is accurate.  Reports he took advil.   Past Medical History:  Diagnosis Date  . CHF (congestive heart failure) (HCC) EF 20 - 25% in 2015  . COPD (chronic obstructive pulmonary disease) ([place])   . HLD (hyperlipidemia)   . HOH (hard of hearing)   . Migraines     Patient Active Problem List   Diagnosis Date Noted  . Acetaminophen toxicity 12/04/2016  . ALF (acute liver failure) 12/04/2016  . 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 Aortic atherosclerosis ([place]) 10/22/2016  . Protein-calorie malnutrition, severe 10/07/2016  . Acute respiratory failure with hypoxia ([place]) 10/07/2016  . Other emphysema ([place]) 10/07/2016  . Transaminasemia 12/03/2015  . Dehydration   . High anion gap metabolic acidosis 20/25/4270  . Malnutrition due to starvation ([place]) 12/02/2015  . Leukocytosis 12/02/2015  . Abdominal pain 10/12/2015  . Nausea & vomiting 10/12/2015  . Chest wall pain 10/12/2015  . AKI (acute kidney injury) ([place]) 10/12/2015  . SBO (small bowel obstruction) 10/12/2015  . Acute respiratory failure ([place]) 08/11/2015  . Hard of hearing 08/11/2015  . CAD (nonobstructive per cath 2015) 08/11/2015  . HTN  (hypertension) 08/11/2015  . HLD (hyperlipidemia)   . Malnutrition of moderate degree ([place]) 03/02/2015  . COPD exacerbation ([place]) 03/01/2015  . Chronic systolic congestive heart failure, NYHA class 3 ([place]) 03/01/2015  . Hyperthyroidism 11/06/2013  . Mass of adrenal gland ([place]) 11/04/2013  . Tobacco abuse 11/04/2013  . Hyponatremia 10/31/2013  . COPD (chronic obstructive pulmonary disease) ([place]) 10/31/2013    Past Surgical History:  Procedure Laterality Date  . APPENDECTOMY    . cardiac stents     No stent history  . LEFT HEART CATHETERIZATION WITH CORONARY ANGIOGRAM N/A 11/07/2013   Procedure: LEFT HEART CATHETERIZATION WITH CORONARY ANGIOGRAM;  Surgeon: Clent Demark, MD;  Location: [facility] CATH LAB;  Service: Cardiovascular;  Laterality: N/A;       Home Medications    Prior to Admission medications   Medication Sig Start Date End Date Taking? Authorizing Provider  albuterol (PROVENTIL HFA;VENTOLIN HFA) 108 (90 Base) MCG/ACT inhaler Inhale 2 puffs into the lungs every 4 (four) hours as needed for wheezing or shortness of breath. 09/19/16  Yes Quintella Reichert, MD  aspirin EC 81 MG tablet Take 1 tablet (81 mg total) by mouth daily. 10/26/16  Yes Modena Jansky, MD  budesonide-formoterol (SYMBICORT) 160-4.5 MCG/ACT inhaler Inhale 2 puffs into the lungs 2 (two) times daily. 09/19/16  Yes Quintella Reichert, MD  ipratropium-albuterol (DUONEB) 0.5-2.5 (3) MG/3ML SOLN Take 3 mLs by nebulization every 6 (six) hours as needed (wheezing, shortness of breath). 10/07/16  Yes Orson Eva, MD  tiotropium (  SPIRIVA) 18 MCG inhalation capsule Place 1 capsule (18 mcg total) into inhaler and inhale daily. 09/19/16  Yes Quintella Reichert, MD    Family History Family History  Problem Relation Age of Onset  . Stroke Mother     Social History Social History  Substance Use Topics  . Smoking status: Current Every Day Smoker    Packs/day: 0.50    Types: Cigarettes  . Smokeless tobacco: Never Used  . Alcohol  use No     Allergies   Patient has no known allergies.   Review of Systems Review of Systems  Unable to perform ROS: Other  Constitutional: Positive for appetite change.  HENT: Negative for congestion.   Respiratory: Positive for cough, shortness of breath and wheezing.   Cardiovascular: Positive for chest pain. Negative for leg swelling.  Gastrointestinal: Positive for abdominal pain (only with deep breaths), nausea and vomiting.  Genitourinary: Positive for dysuria.  Skin: Negative for rash.  Neurological: Negative for headaches.     Physical Exam Updated Vital Signs BP 124/99 (BP Location: Right Arm)   Pulse 83   Temp 97.6 F (36.4 C) (Oral)   Resp 25   Ht 5\' 9"  (1.753 m)   Wt 115 lb (52.2 kg)   SpO2 92%   BMI 16.98 kg/m   Physical Exam  Constitutional: He appears cachectic. He has a sickly appearance. He appears ill. No distress.  HENT:  Head: Normocephalic and atraumatic.  Eyes: Conjunctivae and EOM are normal.  Neck: Normal range of motion.  Cardiovascular: Regular rhythm, normal heart sounds and intact distal pulses.  Tachycardia present.  Exam reveals no gallop and no friction rub.   No murmur heard. Pulmonary/Chest: Effort normal. Tachypnea noted. No respiratory distress. He has decreased breath sounds. He has wheezes. He has no rales.  Abdominal: Soft. He exhibits no distension. There is no tenderness. There is no guarding.  Flinches then reports he is "ticklish" and denies tenderness  Musculoskeletal: He exhibits no edema.  Neurological: He is alert.  Skin: Skin is warm and dry. He is not diaphoretic.  Nursing note and vitals reviewed.    ED Treatments / Results  Labs (all labs ordered are listed, but only abnormal results are displayed) Labs Reviewed  COMPREHENSIVE METABOLIC PANEL - Abnormal; Notable for the following:       Result Value   CO2 19 (*)    Glucose, Bld 100 (*)    BUN 60 (*)    Calcium 8.5 (*)    Total Protein 5.8 (*)    Albumin  3.2 (*)    AST 1,744 (*)    ALT 4,493 (*)    Alkaline Phosphatase 136 (*)    Total Bilirubin 2.1 (*)    All other components within normal limits  CBC WITH DIFFERENTIAL/PLATELET - Abnormal; Notable for the following:    WBC 12.0 (*)    HCT 38.0 (*)    Neutro Abs 11.2 (*)    Lymphs Abs 0.6 (*)    All other components within normal limits  URINALYSIS, ROUTINE W REFLEX MICROSCOPIC - Abnormal; Notable for the following:    Hgb urine dipstick SMALL (*)    Ketones, ur 5 (*)    Bacteria, UA RARE (*)    All other components within normal limits  BRAIN NATRIURETIC PEPTIDE - Abnormal; Notable for the following:    B Natriuretic Peptide 342.8 (*)    All other components within normal limits  BLOOD GAS, VENOUS - Abnormal; Notable for the following:  pCO2, Ven 35.3 (*)    pO2, Ven 53.8 (*)    Bicarbonate 18.9 (*)    Acid-base deficit 5.6 (*)    All other components within normal limits  ACETAMINOPHEN LEVEL - Abnormal; Notable for the following:    Acetaminophen (Tylenol), Serum 72 (*)    All other components within normal limits  PROTIME-INR - Abnormal; Notable for the following:    Prothrombin Time 18.5 (*)    All other components within normal limits  I-STAT TROPOININ, ED - Abnormal; Notable for the following:    Troponin i, poc 0.09 (*)    All other components within normal limits  I-STAT CG4 LACTIC ACID, ED - Abnormal; Notable for the following:    Lactic Acid, Venous 2.18 (*)    All other components within normal limits  I-STAT TROPOININ, ED - Abnormal; Notable for the following:    Troponin i, poc 0.09 (*)    All other components within normal limits  CULTURE, BLOOD (ROUTINE X 2)  CULTURE, BLOOD (ROUTINE X 2)  LIPASE, BLOOD  ETHANOL  RAPID URINE DRUG SCREEN, HOSP PERFORMED  HEPATITIS PANEL, ACUTE  HIV ANTIBODY (ROUTINE TESTING)  SALICYLATE LEVEL  ACETAMINOPHEN LEVEL  COMPREHENSIVE METABOLIC PANEL  AMMONIA  TROPONIN I  TROPONIN I  TROPONIN I  CBC  COMPREHENSIVE  METABOLIC PANEL  PROTIME-INR  I-STAT CG4 LACTIC ACID, ED    EKG  EKG Interpretation  Date/Time:  Thursday December 03 2016 18:30:36 EST Ventricular Rate:  126 PR Interval:    QRS Duration: 83 QT Interval:  374 QTC Calculation: 542 R Axis:   -51 Text Interpretation:  Sinus tachycardia Left anterior fascicular block Repol abnrm suggests ischemia, diffuse leads Prolonged QT interval TW abnormality in anterior leads new from prior  Confirmed by [facility] MD, [place] ([id number]) on 12/03/2016 7:05:21 PM       Radiology Dg Chest 2 View  Result Date: 12/03/2016 CLINICAL DATA:  Chest pain EXAM: CHEST  2 VIEW COMPARISON:  10/22/2016, 10/13/2016 FINDINGS: The lungs are hyperinflated and there is bullous emphysematous disease within the upper lobes. There is mild left pleural scarring. There is no acute infiltrate. Stable cardiomediastinal silhouette. No pneumothorax. IMPRESSION: 1. Hyperinflation with bullous emphysematous disease bilaterally. 2. No acute infiltrate Electronically Signed   By: Donavan Foil M.D.   On: 12/03/2016 19:01   Ct Angio Chest Pe W And/or Wo Contrast  Result Date: 12/03/2016 CLINICAL DATA:  Shortness of breath. Chest pain for 1 day, dysuria for 2 days. History of COPD. EXAM: CT ANGIOGRAPHY CHEST WITH CONTRAST TECHNIQUE: Multidetector CT imaging of the chest was performed using the standard protocol during bolus administration of intravenous contrast. Multiplanar CT image reconstructions and MIPs were obtained to evaluate the vascular anatomy. CONTRAST:  100 cc Isovue 370 COMPARISON:  Chest radiograph December 03, 2016 1848 hours and CT chest January 08, 2016 FINDINGS: Mild respiratory motion degraded examination. CARDIOVASCULAR: Adequate contrast opacification of the pulmonary artery's. Main pulmonary artery is not enlarged. No pulmonary arterial filling defects to the level of the subsegmental branches. Respiratory motion results in heterogeneous density RIGHT lower lobe subsegmental  pulmonary arteries. Heart size is normal, no right heart strain. No pericardial effusion. Thoracic aorta is normal course and caliber, LEFT vertebral artery arises directly from the op aortic arch, normal variant. Trace calcific atherosclerosis of the aortic arch. MEDIASTINUM/NODES: No lymphadenopathy by CT size criteria. LUNGS/PLEURA: Tracheobronchial tree is patent, no pneumothorax. Mild chronic bronchial wall thickening. Severe centrilobular emphysema. No pleural effusion or focal consolidation. Hyperventilation.  UPPER ABDOMEN: Included view of the abdomen is nonacute. Stable 3.9 cm benign LEFT adrenal adenoma (7 Hounsfield units). MUSCULOSKELETAL: Visualized soft tissues and included osseous structures are nonacute. Old posterior LEFT rib fractures. Respiratory motion at multiple levels results in spurious appearance of rib fractures. Review of the MIP images confirms the above findings. IMPRESSION: No acute pulmonary embolism. Stable emphysema and chronic bronchitis. Stable large LEFT adrenal benign adenoma. Electronically Signed   By: Elon Alas M.D.   On: 12/03/2016 21:58   US Abdomen Limited Ruq  Result Date: 12/04/2016 CLINICAL DATA:  Initial evaluation for elevated liver enzymes, bilirubin. Question hepatitis. EXAM: US ABDOMEN LIMITED - RIGHT UPPER QUADRANT COMPARISON:  Prior radiograph from 10/24/2016. FINDINGS: Gallbladder: No gallstones or wall thickening visualized. No sonographic Murphy sign noted by sonographer. Common bile duct: Diameter: 4.2 mm Liver: No focal lesion identified. Within normal limits in parenchymal echogenicity. IMPRESSION: Normal right upper quadrant ultrasound. No sonographic evidence for acute abnormality identified. Electronically Signed   By: Jeannine Boga M.D.   On: 12/04/2016 00:14    Procedures Procedures (including critical care time)  Medications Ordered in ED Medications  iopamidol (ISOVUE-370) 76 % injection (not administered)  vancomycin  (VANCOCIN) 500 mg in sodium chloride 0.9 % 100 mL IVPB (not administered)  ceFEPIme (MAXIPIME) 1 g in dextrose 5 % 50 mL IVPB (not administered)  acetylcysteine (ACETADOTE) 40 mg/mL load via infusion 7,830 mg (7,830 mg Intravenous Bolus from Bag 12/04/16 0055)    Followed by  acetylcysteine (ACETADOTE) 30,000 mg in dextrose 5 % 750 mL (40 mg/mL) infusion (not administered)  ipratropium-albuterol (DUONEB) 0.5-2.5 (3) MG/3ML nebulizer solution 3 mL (not administered)  ipratropium-albuterol (DUONEB) 0.5-2.5 (3) MG/3ML nebulizer solution 3 mL (not administered)  sodium chloride flush (NS) 0.9 % injection 3 mL (not administered)  0.9 %  sodium chloride infusion (not administered)  ondansetron (ZOFRAN) tablet 4 mg (not administered)    Or  ondansetron (ZOFRAN) injection 4 mg (not administered)  sodium chloride 0.9 % bolus 1,000 mL (0 mLs Intravenous Stopped 12/03/16 2055)    And  sodium chloride 0.9 % bolus 500 mL (0 mLs Intravenous Stopped 12/03/16 2233)  ceFEPIme (MAXIPIME) 2 g in dextrose 5 % 50 mL IVPB (0 g Intravenous Stopped 12/03/16 2233)  vancomycin (VANCOCIN) IVPB 1000 mg/200 mL premix (0 mg Intravenous Stopped 12/03/16 2233)  sodium chloride 0.9 % bolus 1,000 mL (0 mLs Intravenous Stopped 12/03/16 2233)  albuterol (PROVENTIL,VENTOLIN) solution continuous neb (10 mg/hr Nebulization Given 12/03/16 2008)  ipratropium (ATROVENT) nebulizer solution 1.5 mg (1.5 mg Nebulization Given 12/03/16 2008)  iopamidol (ISOVUE-370) 76 % injection 100 mL (100 mLs Intravenous Contrast Given 12/03/16 2130)  aspirin chewable tablet 324 mg (324 mg Oral Given 12/03/16 2258)   CRITICAL CARE Performed by: Alvino Chapel   Total critical care time: 60 minutes  Critical care time was exclusive of separately billable procedures and treating other patients.  Critical care was necessary to treat or prevent imminent or life-threatening deterioration.  Critical care was time spent personally by me on the following  activities: development of treatment plan with patient and/or surrogate as well as nursing, discussions with consultants, evaluation of patient's response to treatment, examination of patient, obtaining history from patient or surrogate, ordering and performing treatments and interventions, ordering and review of laboratory studies, ordering and review of radiographic studies, pulse oximetry and re-evaluation of patient's condition.   Initial Impression / Assessment and Plan / ED Course  I have reviewed the triage vital signs and  the nursing notes.  Pertinent labs & imaging results that were available during my care of the patient were reviewed by me and considered in my medical decision making (see chart for details).     66 year old male with a history of COPD, CHF, hyperlipidemia, hypertension, coronary artery disease, hyperthyroidism 24hr of fluid, maybe longer presents with concern for chest pain, shortness of breath and dysuria.  On arrival to the emergency department, patient with blood pressures 68G systolic, with heart rate in the upper 120s. He described cough as well as dysuria, and was empirically given vancomycin and cefepime for coverage of possible healthcare associated pneumonia or UTI. He was also given 30 mL/kg of normal saline. Given chest pain, shortness of breath, and vital signs, PE study was ordered which showed no sign of acute PE. There is no signs of pneumonia or UTI. White count with mild elevation. Lactic acid mildly elevated at 2.1. Unclear if vital sign abnormalities are secondary to sepsis versus dehydration.  Blood cultures pending. Vital signs improved. Lactic acid trended down to normal.  Regarding chest pain, possible etiologies include ACS or COPD exacerbation. CT PE study shows no sign of pulmonary embolus. EKG shows ST depressions in the anterolateral leads, and patient with a troponin elevation in 0.09. Discussed with the cardiac fellow, however given patient's  multiple other acute medical problems at this time have low suspicion for ACS. Feels EKG changes and troponin elevation are likely secondary to strain in setting of acute illness. Given nebulizers with improvement of SOB, suspect some element of COPD.   CMP returned showing LFT elevation with AST 1700s and ALG nearly 5000.  Ordered hepatitis panel and Tylenol levels., Levels returned elevated at 70. Patient was started on IV N-acetylcysteine. Discussed with poison control, who recommends continuing N-acetylcysteine for approximately 24 hours, possibly longer in setting of unknown ingestion. Salicylate and etoh level ordered and pending. Discussed with Gastroenterology, Dr. Henrene Pastor, and feel symptoms may be shock liver versus possible tylenol overdose and report they will see him in the morning.  Patient to be admitted to hospitalist service for concern for acute hepatitis, with possible sepsis of unknown etiology vs dehydration, elevated troponin.     Final Clinical Impressions(s) / ED Diagnoses   Final diagnoses:  Hepatitis  Acetaminophen overdose of undetermined intent, initial encounter  Lactic acidosis  Hypotension, unspecified hypotension type  Chest pain, unspecified type  Elevated troponin    New Prescriptions New Prescriptions   No medications on file     Gareth Morgan, MD 12/04/16 [place], MD 12/04/16 [phone]

## 2016-12-03 NOTE — ED Triage Notes (Signed)
Patient c/o chest pain that started last night, dysuria x 2 days.. increased SOB today.

## 2016-12-04 ENCOUNTER — Encounter (HOSPITAL_COMMUNITY): Payer: Self-pay

## 2016-12-04 DIAGNOSIS — I428 Other cardiomyopathies: Secondary | ICD-10-CM | POA: Diagnosis not present

## 2016-12-04 DIAGNOSIS — T391X1A Poisoning by 4-Aminophenol derivatives, accidental (unintentional), initial encounter: Secondary | ICD-10-CM

## 2016-12-04 DIAGNOSIS — M6281 Muscle weakness (generalized): Secondary | ICD-10-CM | POA: Diagnosis not present

## 2016-12-04 DIAGNOSIS — I2510 Atherosclerotic heart disease of native coronary artery without angina pectoris: Secondary | ICD-10-CM | POA: Diagnosis present

## 2016-12-04 DIAGNOSIS — R740 Nonspecific elevation of levels of transaminase and lactic acid dehydrogenase [LDH]: Secondary | ICD-10-CM | POA: Diagnosis not present

## 2016-12-04 DIAGNOSIS — J42 Unspecified chronic bronchitis: Secondary | ICD-10-CM | POA: Diagnosis not present

## 2016-12-04 DIAGNOSIS — J441 Chronic obstructive pulmonary disease with (acute) exacerbation: Secondary | ICD-10-CM | POA: Diagnosis present

## 2016-12-04 DIAGNOSIS — Z9049 Acquired absence of other specified parts of digestive tract: Secondary | ICD-10-CM | POA: Diagnosis not present

## 2016-12-04 DIAGNOSIS — D649 Anemia, unspecified: Secondary | ICD-10-CM | POA: Diagnosis not present

## 2016-12-04 DIAGNOSIS — T50901A Poisoning by unspecified drugs, medicaments and biological substances, accidental (unintentional), initial encounter: Secondary | ICD-10-CM | POA: Diagnosis not present

## 2016-12-04 DIAGNOSIS — R748 Abnormal levels of other serum enzymes: Secondary | ICD-10-CM | POA: Diagnosis not present

## 2016-12-04 DIAGNOSIS — K7200 Acute and subacute hepatic failure without coma: Secondary | ICD-10-CM | POA: Diagnosis not present

## 2016-12-04 DIAGNOSIS — T391X4A Poisoning by 4-Aminophenol derivatives, undetermined, initial encounter: Secondary | ICD-10-CM | POA: Diagnosis not present

## 2016-12-04 DIAGNOSIS — E872 Acidosis: Secondary | ICD-10-CM

## 2016-12-04 DIAGNOSIS — E860 Dehydration: Secondary | ICD-10-CM | POA: Diagnosis not present

## 2016-12-04 DIAGNOSIS — I10 Essential (primary) hypertension: Secondary | ICD-10-CM | POA: Diagnosis present

## 2016-12-04 DIAGNOSIS — T391X1D Poisoning by 4-Aminophenol derivatives, accidental (unintentional), subsequent encounter: Secondary | ICD-10-CM | POA: Diagnosis not present

## 2016-12-04 DIAGNOSIS — R079 Chest pain, unspecified: Secondary | ICD-10-CM | POA: Diagnosis not present

## 2016-12-04 DIAGNOSIS — Z79899 Other long term (current) drug therapy: Secondary | ICD-10-CM | POA: Diagnosis not present

## 2016-12-04 DIAGNOSIS — K7290 Hepatic failure, unspecified without coma: Secondary | ICD-10-CM | POA: Diagnosis not present

## 2016-12-04 DIAGNOSIS — S36119A Unspecified injury of liver, initial encounter: Secondary | ICD-10-CM | POA: Diagnosis present

## 2016-12-04 DIAGNOSIS — R0602 Shortness of breath: Secondary | ICD-10-CM | POA: Diagnosis not present

## 2016-12-04 DIAGNOSIS — D61818 Other pancytopenia: Secondary | ICD-10-CM | POA: Diagnosis present

## 2016-12-04 DIAGNOSIS — E43 Unspecified severe protein-calorie malnutrition: Secondary | ICD-10-CM | POA: Diagnosis present

## 2016-12-04 DIAGNOSIS — F17210 Nicotine dependence, cigarettes, uncomplicated: Secondary | ICD-10-CM | POA: Diagnosis not present

## 2016-12-04 DIAGNOSIS — H9193 Unspecified hearing loss, bilateral: Secondary | ICD-10-CM | POA: Diagnosis not present

## 2016-12-04 DIAGNOSIS — Z823 Family history of stroke: Secondary | ICD-10-CM | POA: Diagnosis not present

## 2016-12-04 DIAGNOSIS — R945 Abnormal results of liver function studies: Secondary | ICD-10-CM | POA: Diagnosis not present

## 2016-12-04 DIAGNOSIS — R7989 Other specified abnormal findings of blood chemistry: Secondary | ICD-10-CM | POA: Diagnosis present

## 2016-12-04 DIAGNOSIS — Z7982 Long term (current) use of aspirin: Secondary | ICD-10-CM | POA: Diagnosis not present

## 2016-12-04 DIAGNOSIS — I429 Cardiomyopathy, unspecified: Secondary | ICD-10-CM | POA: Diagnosis present

## 2016-12-04 DIAGNOSIS — J449 Chronic obstructive pulmonary disease, unspecified: Secondary | ICD-10-CM | POA: Diagnosis present

## 2016-12-04 DIAGNOSIS — Z720 Tobacco use: Secondary | ICD-10-CM | POA: Diagnosis not present

## 2016-12-04 DIAGNOSIS — H9190 Unspecified hearing loss, unspecified ear: Secondary | ICD-10-CM | POA: Diagnosis present

## 2016-12-04 DIAGNOSIS — Z681 Body mass index (BMI) 19 or less, adult: Secondary | ICD-10-CM | POA: Diagnosis not present

## 2016-12-04 DIAGNOSIS — R262 Difficulty in walking, not elsewhere classified: Secondary | ICD-10-CM | POA: Diagnosis not present

## 2016-12-04 DIAGNOSIS — R1310 Dysphagia, unspecified: Secondary | ICD-10-CM | POA: Diagnosis present

## 2016-12-04 DIAGNOSIS — D696 Thrombocytopenia, unspecified: Secondary | ICD-10-CM | POA: Diagnosis not present

## 2016-12-04 DIAGNOSIS — R4182 Altered mental status, unspecified: Secondary | ICD-10-CM | POA: Diagnosis not present

## 2016-12-04 DIAGNOSIS — R946 Abnormal results of thyroid function studies: Secondary | ICD-10-CM | POA: Diagnosis not present

## 2016-12-04 DIAGNOSIS — Z9119 Patient's noncompliance with other medical treatment and regimen: Secondary | ICD-10-CM | POA: Diagnosis not present

## 2016-12-04 LAB — COMPREHENSIVE METABOLIC PANEL
ALBUMIN: 2.8 g/dL — AB (ref 3.5–5.0)
ALT: 4248 U/L — ABNORMAL HIGH (ref 17–63)
ALT: 4254 U/L — ABNORMAL HIGH (ref 17–63)
ANION GAP: 12 (ref 5–15)
ANION GAP: 9 (ref 5–15)
AST: 1467 U/L — ABNORMAL HIGH (ref 15–41)
AST: 1213 U/L — ABNORMAL HIGH (ref 15–41)
Albumin: 3.1 g/dL — ABNORMAL LOW (ref 3.5–5.0)
Alkaline Phosphatase: 125 U/L (ref 38–126)
Alkaline Phosphatase: 116 U/L (ref 38–126)
BILIRUBIN TOTAL: 1.7 mg/dL — AB (ref 0.3–1.2)
BILIRUBIN TOTAL: 1.9 mg/dL — AB (ref 0.3–1.2)
BUN: 42 mg/dL — ABNORMAL HIGH (ref 6–20)
BUN: 49 mg/dL — AB (ref 6–20)
CALCIUM: 8 mg/dL — AB (ref 8.9–10.3)
CO2: 21 mmol/L — ABNORMAL LOW (ref 22–32)
CO2: 18 mmol/L — ABNORMAL LOW (ref 22–32)
Calcium: 7.9 mg/dL — ABNORMAL LOW (ref 8.9–10.3)
Chloride: 107 mmol/L (ref 101–111)
Chloride: 106 mmol/L (ref 101–111)
Creatinine, Ser: 0.74 mg/dL (ref 0.61–1.24)
Creatinine, Ser: 0.76 mg/dL (ref 0.61–1.24)
GFR calc Af Amer: >60 mL/min (ref >60)
GLUCOSE: 105 mg/dL — AB (ref 65–99)
Glucose, Bld: 107 mg/dL — ABNORMAL HIGH (ref 65–99)
POTASSIUM: 3.7 mmol/L (ref 3.5–5.1)
Potassium: 3.5 mmol/L (ref 3.5–5.1)
Sodium: 137 mmol/L (ref 135–145)
Sodium: 136 mmol/L (ref 135–145)
TOTAL PROTEIN: 5.4 g/dL — AB (ref 6.5–8.1)
TOTAL PROTEIN: 5.7 g/dL — AB (ref 6.5–8.1)

## 2016-12-04 LAB — HEPATIC FUNCTION PANEL
ALBUMIN: 2.7 g/dL — AB (ref 3.5–5.0)
ALT: 3596 U/L — AB (ref 17–63)
AST: 722 U/L — AB (ref 15–41)
Alkaline Phosphatase: 105 U/L (ref 38–126)
Bilirubin, Direct: 0.7 mg/dL — ABNORMAL HIGH (ref 0.1–0.5)
Indirect Bilirubin: 0.7 mg/dL (ref 0.3–0.9)
Total Bilirubin: 1.4 mg/dL — ABNORMAL HIGH (ref 0.3–1.2)
Total Protein: 5 g/dL — ABNORMAL LOW (ref 6.5–8.1)

## 2016-12-04 LAB — MRSA PCR SCREENING: MRSA by PCR: NEGATIVE

## 2016-12-04 LAB — RAPID URINE DRUG SCREEN, HOSP PERFORMED
AMPHETAMINES: NOT DETECTED
BENZODIAZEPINES: NOT DETECTED
Barbiturates: NOT DETECTED
COCAINE: NOT DETECTED
OPIATES: NOT DETECTED
TETRAHYDROCANNABINOL: NOT DETECTED

## 2016-12-04 LAB — PROTIME-INR
INR: 1.66
INR: 1.57
PROTHROMBIN TIME: 19.8 s — AB (ref 11.4–15.2)
PROTHROMBIN TIME: 19 s — AB (ref 11.4–15.2)

## 2016-12-04 LAB — CBC
HEMATOCRIT: 37.9 % — AB (ref 39.0–52.0)
HEMOGLOBIN: 12.5 g/dL — AB (ref 13.0–17.0)
MCH: 29.3 pg (ref 26.0–34.0)
MCHC: 33 g/dL (ref 30.0–36.0)
MCV: 88.8 fL (ref 78.0–100.0)
Platelets: 145 10*3/uL — ABNORMAL LOW (ref 150–400)
RBC: 4.27 MIL/uL (ref 4.22–5.81)
RDW: 14.2 % (ref 11.5–15.5)
WBC: 9.6 10*3/uL (ref 4.0–10.5)

## 2016-12-04 LAB — TROPONIN I
TROPONIN I: 0.06 ng/mL — AB (ref <0.03)
Troponin I: 0.1 ng/mL (ref <0.03)
Troponin I: 0.05 ng/mL (ref <0.03)

## 2016-12-04 LAB — LIPASE, BLOOD: Lipase: 23 U/L (ref 11–51)

## 2016-12-04 LAB — ACETAMINOPHEN LEVEL
Acetaminophen (Tylenol), Serum: 49 ug/mL — ABNORMAL HIGH (ref 10–30)
Acetaminophen (Tylenol), Serum: 32 ug/mL — ABNORMAL HIGH (ref 10–30)

## 2016-12-04 LAB — SALICYLATE LEVEL: Salicylate Lvl: <7 mg/dL (ref 2.8–30.0)

## 2016-12-04 LAB — MONONUCLEOSIS SCREEN: Mono Screen: NEGATIVE

## 2016-12-04 LAB — AMMONIA: AMMONIA: 27 umol/L (ref 9–35)

## 2016-12-04 LAB — ETHANOL: Alcohol, Ethyl (B): <5 mg/dL (ref <5)

## 2016-12-04 MED ORDER — SODIUM CHLORIDE 0.9 % IV SOLN
INTRAVENOUS | Status: DC
  Administered 2016-12-04 – 2016-12-05 (×3): via INTRAVENOUS

## 2016-12-04 MED ORDER — ONDANSETRON HCL 4 MG/2ML IJ SOLN
4.0000 mg | Freq: Four times a day (QID) | INTRAMUSCULAR | Status: DC | PRN
  Administered 2016-12-09: 4 mg via INTRAVENOUS
  Filled 2016-12-04: qty 2

## 2016-12-04 MED ORDER — ONDANSETRON HCL 4 MG PO TABS
4.0000 mg | ORAL_TABLET | Freq: Four times a day (QID) | ORAL | Status: DC | PRN
  Administered 2016-12-06: 4 mg via ORAL
  Filled 2016-12-04: qty 1

## 2016-12-04 MED ORDER — IPRATROPIUM-ALBUTEROL 0.5-2.5 (3) MG/3ML IN SOLN
3.0000 mL | Freq: Four times a day (QID) | RESPIRATORY_TRACT | Status: DC
  Administered 2016-12-04 (×2): 3 mL via RESPIRATORY_TRACT
  Filled 2016-12-04 (×2): qty 3

## 2016-12-04 MED ORDER — IPRATROPIUM-ALBUTEROL 0.5-2.5 (3) MG/3ML IN SOLN
3.0000 mL | Freq: Two times a day (BID) | RESPIRATORY_TRACT | Status: DC
  Administered 2016-12-04: 3 mL via RESPIRATORY_TRACT
  Filled 2016-12-04 (×2): qty 3

## 2016-12-04 MED ORDER — IPRATROPIUM-ALBUTEROL 0.5-2.5 (3) MG/3ML IN SOLN
3.0000 mL | Freq: Four times a day (QID) | RESPIRATORY_TRACT | Status: DC | PRN
  Administered 2016-12-05: 3 mL via RESPIRATORY_TRACT
  Filled 2016-12-04: qty 3

## 2016-12-04 MED ORDER — ORAL CARE MOUTH RINSE
15.0000 mL | Freq: Two times a day (BID) | OROMUCOSAL | Status: DC
  Administered 2016-12-05 – 2016-12-08 (×6): 15 mL via OROMUCOSAL

## 2016-12-04 MED ORDER — SODIUM CHLORIDE 0.9% FLUSH
3.0000 mL | Freq: Two times a day (BID) | INTRAVENOUS | Status: DC
  Administered 2016-12-04 – 2016-12-10 (×11): 3 mL via INTRAVENOUS

## 2016-12-04 NOTE — Care Management Note (Signed)
Case Management Note  Patient Details  Name: DANIYAL TABOR MRN: [id number] Date of Birth: Jan 23, 1951  Subjective/Objective:   Pt refused to talk to CM or answer questions.  Per chart, pt lives @ motel.  Will continue to follow.                    Expected Discharge Plan:  Skilled Nursing Facility  In-House Referral:  Clinical Social Work  Discharge planning Services  CM Consult  Status of Service:  In process, will continue to follow  Berton Mount, RN 12/04/2016, 3:49 PM

## 2016-12-04 NOTE — Progress Notes (Addendum)
MD called and inquired about labs ordered but not collected. Phlebotomy contacted and asked to collect lab draws early. Ordered time for labs were 1400. Phlebotomist attempted to collect; unsuccessfully. Another phlebotomist en route. Patient tolerated well. Bed is low and brakes are locked. Bed alarm activated. Call bell and personal items within reach. Will continue to monitor.

## 2016-12-04 NOTE — Progress Notes (Signed)
Pt arrived from [facility]. Pt is alert and oriented x 4 but is confused, very hard of hearing, and speech is garbled and difficult to understand.  Vital signs stable, HR 115, BP 128/94, RR 20, SpO2 100% on 4LNC.  Pt belongings consisted of only his clothing (t-shirt and pajama pants), which was removed and placed in a pt belonging bag.  MD Hal Hope notified of his arrival.  Will continue to monitor.

## 2016-12-04 NOTE — Consult Note (Signed)
HPI :  66 y/o male with history of CHF, COPD, CAD, hearing loss, who presented to the ER with shortness of breath and a headache. He is a very poor historian and difficult to clarify what and how much he took for headache treatment, but reported to tylenol intake for headache. In the ER he had an elevated lactic acid, CXR showed COPD changes, also had CTA chest which ruled out PE. EKG showed some ST depression and mildly positive troponin, cardiology contacted with low suspicion for ACS.   His labs were remarkable for significant transaminitis with ALT in 4000s. INR overnight elevated to 1.6. No known history of liver disease but patient cannot report much history. RUQ [place] did not show any significant abnormalities. Patient can state date and knows where he is, but difficult to assess baseline mental status and answering to questions as he does have significant hearing loss. Trying to get out of bed with multiple nurses at bedside keeping him in the bed.   Past Medical History:  Diagnosis Date  . CHF (congestive heart failure) (HCC) EF 20 - 25% in 2015  . COPD (chronic obstructive pulmonary disease) ([place])   . HLD (hyperlipidemia)   . HOH (hard of hearing)   . Migraines      Past Surgical History:  Procedure Laterality Date  . APPENDECTOMY    . cardiac stents     No stent history  . LEFT HEART CATHETERIZATION WITH CORONARY ANGIOGRAM N/A 11/07/2013   Procedure: LEFT HEART CATHETERIZATION WITH CORONARY ANGIOGRAM;  Surgeon: Clent Demark, MD;  Location: [facility] CATH LAB;  Service: Cardiovascular;  Laterality: N/A;   Family History  Problem Relation Age of Onset  . Stroke Mother    Social History  Substance Use Topics  . Smoking status: Current Every Day Smoker    Packs/day: 0.50    Types: Cigarettes  . Smokeless tobacco: Never Used  . Alcohol use No   Current Facility-Administered Medications  Medication Dose Route Frequency Provider Last Rate Last Dose  . 0.9 %  sodium chloride  infusion   Intravenous Continuous Lily Kocher, MD 50 mL/hr at 12/04/16 0332    . acetylcysteine (ACETADOTE) 30,000 mg in dextrose 5 % 750 mL (40 mg/mL) infusion  15 mg/kg/hr Intravenous Continuous Gareth Morgan, MD 19.6 mL/hr at 12/04/16 0156 15 mg/kg/hr at 12/04/16 0156  . iopamidol (ISOVUE-370) 76 % injection           . ipratropium-albuterol (DUONEB) 0.5-2.5 (3) MG/3ML nebulizer solution 3 mL  3 mL Nebulization Q6H PRN Lily Kocher, MD      . ipratropium-albuterol (DUONEB) 0.5-2.5 (3) MG/3ML nebulizer solution 3 mL  3 mL Nebulization QID Lily Kocher, MD   3 mL at 12/04/16 0746  . MEDLINE mouth rinse  15 mL Mouth Rinse BID Rise Patience, MD      . ondansetron [facility]) tablet 4 mg  4 mg Oral Q6H PRN Lily Kocher, MD       Or  . ondansetron [facility]) injection 4 mg  4 mg Intravenous Q6H PRN Lily Kocher, MD      . sodium chloride flush (NS) 0.9 % injection 3 mL  3 mL Intravenous Q12H Lily Kocher, MD   3 mL at 12/04/16 0335   No Known Allergies   Review of Systems: All systems reviewed and negative except where noted in HPI.    Dg Chest 2 View  Result Date: 12/03/2016 CLINICAL DATA:  Chest pain EXAM: CHEST  2 VIEW COMPARISON:  10/22/2016, 10/13/2016 FINDINGS: The lungs are hyperinflated and there is bullous emphysematous disease within the upper lobes. There is mild left pleural scarring. There is no acute infiltrate. Stable cardiomediastinal silhouette. No pneumothorax. IMPRESSION: 1. Hyperinflation with bullous emphysematous disease bilaterally. 2. No acute infiltrate Electronically Signed   By: Donavan Foil M.D.   On: 12/03/2016 19:01   Ct Angio Chest Pe W And/or Wo Contrast  Result Date: 12/03/2016 CLINICAL DATA:  Shortness of breath. Chest pain for 1 day, dysuria for 2 days. History of COPD. EXAM: CT ANGIOGRAPHY CHEST WITH CONTRAST TECHNIQUE: Multidetector CT imaging of the chest was performed using the standard protocol during bolus administration of intravenous contrast.  Multiplanar CT image reconstructions and MIPs were obtained to evaluate the vascular anatomy. CONTRAST:  100 cc Isovue 370 COMPARISON:  Chest radiograph December 03, 2016 1848 hours and CT chest January 08, 2016 FINDINGS: Mild respiratory motion degraded examination. CARDIOVASCULAR: Adequate contrast opacification of the pulmonary artery's. Main pulmonary artery is not enlarged. No pulmonary arterial filling defects to the level of the subsegmental branches. Respiratory motion results in heterogeneous density RIGHT lower lobe subsegmental pulmonary arteries. Heart size is normal, no right heart strain. No pericardial effusion. Thoracic aorta is normal course and caliber, LEFT vertebral artery arises directly from the op aortic arch, normal variant. Trace calcific atherosclerosis of the aortic arch. MEDIASTINUM/NODES: No lymphadenopathy by CT size criteria. LUNGS/PLEURA: Tracheobronchial tree is patent, no pneumothorax. Mild chronic bronchial wall thickening. Severe centrilobular emphysema. No pleural effusion or focal consolidation. Hyperventilation. UPPER ABDOMEN: Included view of the abdomen is nonacute. Stable 3.9 cm benign LEFT adrenal adenoma (7 Hounsfield units). MUSCULOSKELETAL: Visualized soft tissues and included osseous structures are nonacute. Old posterior LEFT rib fractures. Respiratory motion at multiple levels results in spurious appearance of rib fractures. Review of the MIP images confirms the above findings. IMPRESSION: No acute pulmonary embolism. Stable emphysema and chronic bronchitis. Stable large LEFT adrenal benign adenoma. Electronically Signed   By: Elon Alas M.D.   On: 12/03/2016 21:58   US Abdomen Limited Ruq  Result Date: 12/04/2016 CLINICAL DATA:  Initial evaluation for elevated liver enzymes, bilirubin. Question hepatitis. EXAM: US ABDOMEN LIMITED - RIGHT UPPER QUADRANT COMPARISON:  Prior radiograph from 10/24/2016. FINDINGS: Gallbladder: No gallstones or wall thickening  visualized. No sonographic Murphy sign noted by sonographer. Common bile duct: Diameter: 4.2 mm Liver: No focal lesion identified. Within normal limits in parenchymal echogenicity. IMPRESSION: Normal right upper quadrant ultrasound. No sonographic evidence for acute abnormality identified. Electronically Signed   By: Jeannine Boga M.D.   On: 12/04/2016 00:14    Lab Results  Component Value Date   WBC 9.6 12/04/2016   HGB 12.5 (L) 12/04/2016   HCT 37.9 (L) 12/04/2016   MCV 88.8 12/04/2016   PLT 145 (L) 12/04/2016    Lab Results  Component Value Date   CREATININE 0.76 12/04/2016   BUN 42 (H) 12/04/2016   NA 136 12/04/2016   K 3.5 12/04/2016   CL 106 12/04/2016   CO2 18 (L) 12/04/2016    Lab Results  Component Value Date   ALT 4,254 (H) 12/04/2016   AST 1,213 (H) 12/04/2016   ALKPHOS 116 12/04/2016   BILITOT 1.9 (H) 12/04/2016    Lab Results  Component Value Date   INR 1.66 12/04/2016   INR 1.53 12/03/2016   INR 1.01 10/22/2016      Physical Exam: BP (!) 139/94 (BP Location: Left Leg)   Pulse (!) 115   Temp 98.7 F (  37.1 C) (Axillary)   Resp 18   Ht 5\' 9"  (1.753 m)   Wt 107 lb 9.4 oz (48.8 kg)   SpO2 99%   BMI 15.89 kg/m  Constitutional:  male in no acute distress. HEENT:  Conjunctivae are normal. No scleral icterus. Neck supple.  Cardiovascular: tachycardic, regular rhythm.  Pulmonary/chest: Effort normal and breath sounds normal. Abdominal: Soft, nondistended, nontender. There are no masses palpable. No hepatomegaly. Extremities: no edema Lymphadenopathy: No cervical adenopathy noted. Neurological: Alert and oriented to person place and time. Unable to assess for asterixis, patient not following directions Skin: Skin is warm and dry. No rashes noted.    ASSESSMENT AND PLAN: 66 y/o male presenting with acute liver injury with profound transaminitis and newly elevated INR to 1.6. We are not sure what his baseline mental status is but he does appear  somewhat confused, and I am concerned for acute live failure, likely due to acetominophen toxicity based on labs but will ensure no other pathology. Given his comorbidities he may not be a transplant candidate but will reach out to one of the transplant centers to discuss his case and get their opinion.   At this time recommend: - frequent neuro checks, if overt encephalopathy develops he would need intubation - discussed with primary service, they will make critical care aware in case he decompensates further, currently on step-down unit - continue NAC protocol and trend acetominophen levels which are downtrending - repeat INR later this morning and trend - repeat LFTs this afternoon - await acute hepatitis panel - check monospot / EBV, IgG, ANA, SMA, ceruloplasmin, HSV IgM, CMV titers  - US doppler of liver, ensure no thrombosis  We will follow closely, please page with questions or changes in his status.   Chester Cellar, MD [facility] Pager [phone]

## 2016-12-04 NOTE — H&P (Signed)
History and Physical    Anthony Bolton:[id number] DOB: 25-Apr-1951 DOA: 12/03/2016  PCP: [name], MD   Patient coming from: [facility]  Chief Complaint: Chest pain and shortness of breath  HPI: Anthony Bolton is a 66 y.o. gentleman with a history of nonobstructing CAD, COPD (he is not on home oxygen), active tobacco use with dependence, prior CHF (LV systolic function normal on last echo in 2016), and hard of hearing who called EMS for evaluation of chest pain and shortness of breath, onset today.  The patient is a poor historian.  He tells me that he had a migraine headache on Tuesday and Wednesday and he took unclear amount of acetaminophen, NSAIDs, and aspirin.  The headache resolved, but today he has had chest tightness and shortness of breath.  No nausea, vomiting, or diaphoresis.  No light-headedness or dizziness.  No syncope.  No abdominal pain.  No diarrhea.  No new oxygen requirement.  By the time I saw him in the ED, he was chest pain free but still short of breath.  ED Course: He received a nebulizer treatment en route to the hospital.  In the ED, lactic acid level was 2.18 so he was treated per sepsis protocol with NS 30cc/kg (2.5L), empiric cefepime and vancomycin, and nebulizer treatments.  Chest xray showed chronic emphysematous changes but no acute process.  He was sent for CTA of the chest to rule out PE.  VBG showed pH 7.346, pCO2 35, pO2 54, bicarb 19.    He also had a troponin of 0.09 with EKG changes (diffuse ST depressions, probable repolarization abnormality).  Per ED attending, case was discussed with cardiology attending on call.  Low index of suspicion for ACS.  Serial troponin recommended for now.  Additional labs were concerning for acute liver injury with AST 1744, ALT 4493, Alk Phos 136, T bil 2.  BUN elevated to 60 but normal creatinine.  Serum bicarb 19.  Acetaminophen level elevated at 72.  EtOH level negative.  Salicylate level pending.  Acute  hepatitis panel and HIV pending.  RUQ ultrasound unremarkable.    Poison control contacted by the ED attending.  IV N-acetylcysteine recommended for at least 24 hours.  Case discussed with GI attending on call, Dr. Henrene Pastor with [facility].  Per the ED attending, he has said that the patient can be managed in the [facility] system for now.  No recommendation for transfer.  Patient will be seen by GI attending in the AM.  Case also discussed with critical care attending on call; admission deferred to the hospitalist at this time.   Review of Systems: 10 systems reviewed and negative, except as stated in the HPI.   Past Medical History:  Diagnosis Date  . CHF (congestive heart failure) (HCC) EF 20 - 25% in 2015  . COPD (chronic obstructive pulmonary disease) ([place])   . HLD (hyperlipidemia)   . HOH (hard of hearing)   . Migraines     Past Surgical History:  Procedure Laterality Date  . APPENDECTOMY    . cardiac stents     No stent history  . LEFT HEART CATHETERIZATION WITH CORONARY ANGIOGRAM N/A 11/07/2013   Procedure: LEFT HEART CATHETERIZATION WITH CORONARY ANGIOGRAM;  Surgeon: Clent Demark, MD;  Location: [facility] CATH LAB;  Service: Cardiovascular;  Laterality: N/A;     reports that he has been smoking Cigarettes.  He has been smoking about 0.50 packs per day. He has never used smokeless tobacco. He reports  that he does not drink alcohol or use drugs.  Adamantly denies EtOH or recreational drug use.  Admits to active tobacco use.  No Known Allergies  Family History  Problem Relation Age of Onset  . Stroke Mother      Prior to Admission medications   Medication Sig Start Date End Date Taking? Authorizing Provider  albuterol (PROVENTIL HFA;VENTOLIN HFA) 108 (90 Base) MCG/ACT inhaler Inhale 2 puffs into the lungs every 4 (four) hours as needed for wheezing or shortness of breath. 09/19/16  Yes Quintella Reichert, MD  aspirin EC 81 MG tablet Take 1 tablet (81 mg total) by mouth daily. 10/26/16  Yes  Modena Jansky, MD  budesonide-formoterol (SYMBICORT) 160-4.5 MCG/ACT inhaler Inhale 2 puffs into the lungs 2 (two) times daily. 09/19/16  Yes Quintella Reichert, MD  ipratropium-albuterol (DUONEB) 0.5-2.5 (3) MG/3ML SOLN Take 3 mLs by nebulization every 6 (six) hours as needed (wheezing, shortness of breath). 10/07/16  Yes Orson Eva, MD  tiotropium (SPIRIVA) 18 MCG inhalation capsule Place 1 capsule (18 mcg total) into inhaler and inhale daily. 09/19/16  Yes Quintella Reichert, MD    Physical Exam: Vitals:   12/03/16 2100 12/03/16 2147 12/03/16 2254 12/04/16 0049  BP: 117/81 125/99 120/91 124/99  Pulse: 113 113 119 83  Resp: 19 16 20 25   Temp:      TempSrc:      SpO2: 93% 99% 100% 92%  Weight:      Height:          Constitutional: NAD, calm, unkempt appearance but rather NONtoxic appearing Vitals:   12/03/16 2100 12/03/16 2147 12/03/16 2254 12/04/16 0049  BP: 117/81 125/99 120/91 124/99  Pulse: 113 113 119 83  Resp: 19 16 20 25   Temp:      TempSrc:      SpO2: 93% 99% 100% 92%  Weight:      Height:       Eyes: PERRL, lids and conjunctivae normal ENMT: Mucous membranes are very DRY (tongue sticking to roof of mouth; difficulty enunciating). Posterior pharynx clear of any exudate or lesions. Edentulous. Neck: normal appearance, supple, no masses Respiratory: No wheezing or ronchi on my exam but he speaks in short sentences and is mildly tachypnic.  No accessory muscle use.  Cardiovascular: Tachycardic but regular.  No murmurs / rubs / gallops. No extremity edema. 2+ pedal pulses.  GI: abdomen is soft and compressible.  No distention.  No tenderness.  No masses palpated.  Bowel sounds are present. Musculoskeletal:  No joint deformity in upper and lower extremities. Good ROM, no contractures. Normal muscle tone.  Skin: no rashes, warm and dry, multiple bruises, dirty nail beds Neurologic: CN 2-12 grossly intact. Sensation intact, Strength symmetric bilaterally, 5/5.  No pronator  drift. Psychiatric: Alert and oriented x 3 but he is extremely hard of hearing, which is a barrier to discerning insight.  He cannot tell me who the [place] president is, but it's not clear if he understands the question.  Mood seems appropriate but judgment is impaired.    Labs on Admission: I have personally reviewed following labs and imaging studies  CBC:  Recent Labs Lab 12/03/16 2010  WBC 12.0*  NEUTROABS 11.2*  HGB 13.0  HCT 38.0*  MCV 86.8  PLT 387   Basic Metabolic Panel:  Recent Labs Lab 12/03/16 2010  NA 135  K 4.0  CL 102  CO2 19*  GLUCOSE 100*  BUN 60*  CREATININE 0.93  CALCIUM 8.5*   GFR: Estimated  Creatinine Clearance: 57.7 mL/min (by C-G formula based on SCr of 0.93 mg/dL). Liver Function Tests:  Recent Labs Lab 12/03/16 2010  AST 1,744*  ALT 4,493*  ALKPHOS 136*  BILITOT 2.1*  PROT 5.8*  ALBUMIN 3.2*    Recent Labs Lab 12/03/16 2333  LIPASE 23   Coagulation Profile:  Recent Labs Lab 12/03/16 1950  INR 1.53   Urine analysis:    Component Value Date/Time   COLORURINE YELLOW 12/03/2016 2224   APPEARANCEUR CLEAR 12/03/2016 2224   LABSPEC 1.026 12/03/2016 2224   PHURINE 6.0 12/03/2016 2224   GLUCOSEU NEGATIVE 12/03/2016 2224   HGBUR SMALL (A) 12/03/2016 2224   BILIRUBINUR NEGATIVE 12/03/2016 2224   KETONESUR 5 (A) 12/03/2016 2224   PROTEINUR NEGATIVE 12/03/2016 2224   UROBILINOGEN 0.2 05/02/2015 1439   NITRITE NEGATIVE 12/03/2016 2224   LEUKOCYTESUR NEGATIVE 12/03/2016 2224   Sepsis Labs:  Lactic acid level 2.18, repeat 1.52  Radiological Exams on Admission: Dg Chest 2 View  Result Date: 12/03/2016 CLINICAL DATA:  Chest pain EXAM: CHEST  2 VIEW COMPARISON:  10/22/2016, 10/13/2016 FINDINGS: The lungs are hyperinflated and there is bullous emphysematous disease within the upper lobes. There is mild left pleural scarring. There is no acute infiltrate. Stable cardiomediastinal silhouette. No pneumothorax. IMPRESSION: 1.  Hyperinflation with bullous emphysematous disease bilaterally. 2. No acute infiltrate Electronically Signed   By: Donavan Foil M.D.   On: 12/03/2016 19:01   Ct Angio Chest Pe W And/or Wo Contrast  Result Date: 12/03/2016 CLINICAL DATA:  Shortness of breath. Chest pain for 1 day, dysuria for 2 days. History of COPD. EXAM: CT ANGIOGRAPHY CHEST WITH CONTRAST TECHNIQUE: Multidetector CT imaging of the chest was performed using the standard protocol during bolus administration of intravenous contrast. Multiplanar CT image reconstructions and MIPs were obtained to evaluate the vascular anatomy. CONTRAST:  100 cc Isovue 370 COMPARISON:  Chest radiograph December 03, 2016 1848 hours and CT chest January 08, 2016 FINDINGS: Mild respiratory motion degraded examination. CARDIOVASCULAR: Adequate contrast opacification of the pulmonary artery's. Main pulmonary artery is not enlarged. No pulmonary arterial filling defects to the level of the subsegmental branches. Respiratory motion results in heterogeneous density RIGHT lower lobe subsegmental pulmonary arteries. Heart size is normal, no right heart strain. No pericardial effusion. Thoracic aorta is normal course and caliber, LEFT vertebral artery arises directly from the op aortic arch, normal variant. Trace calcific atherosclerosis of the aortic arch. MEDIASTINUM/NODES: No lymphadenopathy by CT size criteria. LUNGS/PLEURA: Tracheobronchial tree is patent, no pneumothorax. Mild chronic bronchial wall thickening. Severe centrilobular emphysema. No pleural effusion or focal consolidation. Hyperventilation. UPPER ABDOMEN: Included view of the abdomen is nonacute. Stable 3.9 cm benign LEFT adrenal adenoma (7 Hounsfield units). MUSCULOSKELETAL: Visualized soft tissues and included osseous structures are nonacute. Old posterior LEFT rib fractures. Respiratory motion at multiple levels results in spurious appearance of rib fractures. Review of the MIP images confirms the above findings.  IMPRESSION: No acute pulmonary embolism. Stable emphysema and chronic bronchitis. Stable large LEFT adrenal benign adenoma. Electronically Signed   By: Elon Alas M.D.   On: 12/03/2016 21:58   US Abdomen Limited Ruq  Result Date: 12/04/2016 CLINICAL DATA:  Initial evaluation for elevated liver enzymes, bilirubin. Question hepatitis. EXAM: US ABDOMEN LIMITED - RIGHT UPPER QUADRANT COMPARISON:  Prior radiograph from 10/24/2016. FINDINGS: Gallbladder: No gallstones or wall thickening visualized. No sonographic Murphy sign noted by sonographer. Common bile duct: Diameter: 4.2 mm Liver: No focal lesion identified. Within normal limits in parenchymal echogenicity. IMPRESSION:  Normal right upper quadrant ultrasound. No sonographic evidence for acute abnormality identified. Electronically Signed   By: Jeannine Boga M.D.   On: 12/04/2016 00:14    EKG: Independently reviewed by me.  Sinus tachycardia.  Probable LVH with repolarization abnormality.  Diffuse ST depression, no specific pattern.    Assessment/Plan Principal Problem:   Acetaminophen toxicity Active Problems:   COPD (chronic obstructive pulmonary disease) (HCC)   Tobacco abuse   Hard of hearing   CAD (nonobstructive per cath 2015)   HTN (hypertension)   ALF (acute liver failure)      Acetaminophen toxicity, unintentional overdose while treating migraine headache earlier this week, causing liver injury (not convinced that he is truly encephalopathic at this point) --Admit to stepdown unit (patient is critically ill; [facility] will [facility] to [facility] due [facility] bed availability). --Repeat CMP NOW to look for LFT trend.  If LFTs are rising, I will reconsider referral to academic center tonight. --IV N-acetylcysteine per protocol for at least 24 hours.  Repeat CMP, INR in the AM. --No role for HD at this point --Salicylate level and ammonia level pending --Acute hepatitis panel and HIV pending  Nonanion gap metabolic acidosis, attributed  to acute toxicity at this point. --Follow labs.  S/P aggressive hydration in the ED.  Dehydration --S/P aggressive hydration in the ED.   --Gentle maintenance fluids for now --NPO except ice chips and water until liver status is clearer --Oral care protocol  Concern for sepsis with lactic acidosis in the ED.  Lactate now normal. --Low suspicion for sepsis due to acute infection at this point --Blood cultures pending --Can probably STOP antibiotics in the AM --Urine consistent with dehydration  Elevated troponin of unclear significance, history of nonobstructive CAD --Serial troponin for now --Will not give any more ASA until salicylate level is known --Telemetry monitoring  COPD --Appears compensated --Hold inhalers for now --Scheduled duonebs while inpatient --Oxygen as needed, O2 sat 92-94%  Active tobacco use --Patient has no plans to stop smoking      DVT prophylaxis: SCDs Code Status: FULL Family Communication: Patient alone in the ED at time of admission.  Spoke to one of his emergency contacts listed on the facesheet, by phone.  Will need social worker to help with locating his son (did not answer at the number listed tonight). Disposition Plan: To be determined. Consults called: Gastroenterology should see the patient in the AM.  Critical Care will need to be reconsulted if clinical status deteriorates.  Cardiology will need to be reconsulted if there is true concern for ACS. Admission status: Inpatient, stepdown unit.   TIME SPENT: 80 minutes   Eber Jones MD [facility] Pager [phone]  If 7PM-7AM, please contact night-coverage [email or web address] Password [email or web address]  12/04/2016, 1:07 AM

## 2016-12-04 NOTE — Progress Notes (Signed)
Pt has a greenish area of discoloration on his anterior right lower leg. The skin is intact and the area did not wash clean. He has no complaints of pain in this leg.  Will pass on to day RN.

## 2016-12-04 NOTE — Progress Notes (Signed)
Pts. Son Dellis Filbert called at 2030 on 12/04/2016.  Basic update given. He is prepared for a more in depth conversation concerning prognosis and goals of care with either Dr. Havery Moros or Dr. Maryland Pink in the morning. He says he will be available anytime after 1030 on 12/05/2016. Phone number is [phone].  Secondary phone number is [phone]. Secondary line belongs to Ava, who is Jeffrey's wife.

## 2016-12-04 NOTE — Progress Notes (Signed)
[facility] PROGRESS NOTE  TORIAN QUINTERO FUX:[id number] DOB: 06/15/1951 DOA: 12/03/2016  PCP: [name], MD  Brief History/Interval Summary: 66 y.o. gentleman with a history of nonobstructing CAD, COPD (he is not on home oxygen), active tobacco use with dependence, prior CHF (LV systolic function normal on last echo in 2016), and hard of hearing who called EMS for evaluation of chest pain and shortness of breath. The patient is a poor historian. Patient was experiencing migraine headaches and took unclear amount of acetaminophen, NSAIDs and aspirin. No clear reason was found for his chest pain or shortness of breath. However, he was found to have elevated liver function tests. He also had elevated Tylenol level. He was admitted for further management of Tylenol toxicity.   Reason for Visit: Acetaminophen toxicity with acute liver failure  Consultants: Gastroenterology. Critical care medicine.  Procedures: None  Antibiotics: He was started on vancomycin and cefepime which will be discontinued today. 3/9  Subjective/Interval History: Patient seems to be somewhat agitated. He is asking for his inhaler. He does answer some questions appropriately. Very poor historian.  ROS: Unable to do as he is extremely poor historian  Objective:  Vital Signs  Vitals:   12/04/16 1000 12/04/16 1100 12/04/16 1146 12/04/16 1200  BP: (!) 137/99 (!) 124/100  (!) 135/97  Pulse: (!) 104 (!) 108  (!) 114  Resp: 18 18  (!) 24  Temp:      TempSrc:      SpO2: 100% 100% 100% 100%  Weight:      Height:        Intake/Output Summary (Last 24 hours) at 12/04/16 1228 Last data filed at 12/04/16 1200  Gross per 24 hour  Intake           2971.7 ml  Output             1055 ml  Net           1916.7 ml   Filed Weights   12/03/16 1821 12/03/16 2010 12/04/16 0318  Weight: 49.9 kg (110 lb) 52.2 kg (115 lb) 48.8 kg (107 lb 9.4 oz)    General appearance: alert, distracted and no distress Resp:  clear to auscultation bilaterally Cardio: regular rate and rhythm, S1, S2 normal, no murmur, click, rub or gallop GI: soft, non-tender; bowel sounds normal; no masses,  no organomegaly Extremities: extremities normal, atraumatic, no cyanosis or edema Neurologic: Awake and alert. Somewhat distracted. No obvious facial asymmetry. Moving all his extremities.  Lab Results:  Data Reviewed: I have personally reviewed following labs and imaging studies  CBC:  Recent Labs Lab 12/03/16 2010 12/04/16 0348  WBC 12.0* 9.6  NEUTROABS 11.2*  --   HGB 13.0 12.5*  HCT 38.0* 37.9*  MCV 86.8 88.8  PLT 157 145*    Basic Metabolic Panel:  Recent Labs Lab 12/03/16 2010 12/04/16 0033 12/04/16 0348  NA 135 137 136  K 4.0 3.7 3.5  CL 102 107 106  CO2 19* 21* 18*  GLUCOSE 100* 107* 105*  BUN 60* 49* 42*  CREATININE 0.93 0.74 0.76  CALCIUM 8.5* 7.9* 8.0*    GFR: Estimated Creatinine Clearance: 62.7 mL/min (by C-G formula based on SCr of 0.76 mg/dL).  Liver Function Tests:  Recent Labs Lab 12/03/16 2010 12/04/16 0033 12/04/16 0348  AST 1,744* 1,467* 1,213*  ALT 4,493* 4,248* 4,254*  ALKPHOS 136* 125 116  BILITOT 2.1* 1.7* 1.9*  PROT 5.8* 5.7* 5.4*  ALBUMIN 3.2* 3.1* 2.8*  Recent Labs Lab 12/03/16 2333  LIPASE 23    Recent Labs Lab 12/04/16 0108  AMMONIA 27    Coagulation Profile:  Recent Labs Lab 12/03/16 1950 12/04/16 0348  INR 1.53 1.66    Cardiac Enzymes:  Recent Labs Lab 12/04/16 0807  TROPONINI 0.06*    Recent Results (from the past 240 hour(s))  Blood Culture (routine x 2)     Status: None (Preliminary result)   Collection Time: 12/03/16  7:25 PM  Result Value Ref Range Status   Specimen Description BLOOD LEFT HAND  Final   Special Requests BOTTLES DRAWN AEROBIC AND ANAEROBIC 5CC EACH  Final   Culture   Final    NO GROWTH < 12 HOURS Performed at [facility], [place] [address], [place], [place] [address]    Report Status PENDING   Incomplete  Blood Culture (routine x 2)     Status: None (Preliminary result)   Collection Time: 12/03/16  7:50 PM  Result Value Ref Range Status   Specimen Description BLOOD BLOOD RIGHT FOREARM  Final   Special Requests BOTTLES DRAWN AEROBIC AND ANAEROBIC 5CC EACH  Final   Culture   Final    NO GROWTH < 12 HOURS Performed at [facility], [place] [address], [place], [place] [address]    Report Status PENDING  Incomplete  MRSA PCR Screening     Status: None   Collection Time: 12/04/16  3:27 AM  Result Value Ref Range Status   MRSA by PCR NEGATIVE NEGATIVE Final    Comment:        The GeneXpert MRSA Assay (FDA approved for NASAL specimens only), is one component of a comprehensive MRSA colonization surveillance program. It is not intended to diagnose MRSA infection nor to guide or monitor treatment for MRSA infections.       Radiology Studies: Dg Chest 2 View  Result Date: 12/03/2016 CLINICAL DATA:  Chest pain EXAM: CHEST  2 VIEW COMPARISON:  10/22/2016, 10/13/2016 FINDINGS: The lungs are hyperinflated and there is bullous emphysematous disease within the upper lobes. There is mild left pleural scarring. There is no acute infiltrate. Stable cardiomediastinal silhouette. No pneumothorax. IMPRESSION: 1. Hyperinflation with bullous emphysematous disease bilaterally. 2. No acute infiltrate Electronically Signed   By: Donavan Foil M.D.   On: 12/03/2016 19:01   Ct Angio Chest Pe W And/or Wo Contrast  Result Date: 12/03/2016 CLINICAL DATA:  Shortness of breath. Chest pain for 1 day, dysuria for 2 days. History of COPD. EXAM: CT ANGIOGRAPHY CHEST WITH CONTRAST TECHNIQUE: Multidetector CT imaging of the chest was performed using the standard protocol during bolus administration of intravenous contrast. Multiplanar CT image reconstructions and MIPs were obtained to evaluate the vascular anatomy. CONTRAST:  100 cc Isovue 370 COMPARISON:  Chest radiograph December 03, 2016 1848 hours and CT  chest January 08, 2016 FINDINGS: Mild respiratory motion degraded examination. CARDIOVASCULAR: Adequate contrast opacification of the pulmonary artery's. Main pulmonary artery is not enlarged. No pulmonary arterial filling defects to the level of the subsegmental branches. Respiratory motion results in heterogeneous density RIGHT lower lobe subsegmental pulmonary arteries. Heart size is normal, no right heart strain. No pericardial effusion. Thoracic aorta is normal course and caliber, LEFT vertebral artery arises directly from the op aortic arch, normal variant. Trace calcific atherosclerosis of the aortic arch. MEDIASTINUM/NODES: No lymphadenopathy by CT size criteria. LUNGS/PLEURA: Tracheobronchial tree is patent, no pneumothorax. Mild chronic bronchial wall thickening. Severe centrilobular emphysema. No pleural effusion or focal consolidation.  Hyperventilation. UPPER ABDOMEN: Included view of the abdomen is nonacute. Stable 3.9 cm benign LEFT adrenal adenoma (7 Hounsfield units). MUSCULOSKELETAL: Visualized soft tissues and included osseous structures are nonacute. Old posterior LEFT rib fractures. Respiratory motion at multiple levels results in spurious appearance of rib fractures. Review of the MIP images confirms the above findings. IMPRESSION: No acute pulmonary embolism. Stable emphysema and chronic bronchitis. Stable large LEFT adrenal benign adenoma. Electronically Signed   By: Elon Alas M.D.   On: 12/03/2016 21:58   US Abdomen Limited Ruq  Result Date: 12/04/2016 CLINICAL DATA:  Initial evaluation for elevated liver enzymes, bilirubin. Question hepatitis. EXAM: US ABDOMEN LIMITED - RIGHT UPPER QUADRANT COMPARISON:  Prior radiograph from 10/24/2016. FINDINGS: Gallbladder: No gallstones or wall thickening visualized. No sonographic Murphy sign noted by sonographer. Common bile duct: Diameter: 4.2 mm Liver: No focal lesion identified. Within normal limits in parenchymal echogenicity. IMPRESSION:  Normal right upper quadrant ultrasound. No sonographic evidence for acute abnormality identified. Electronically Signed   By: Jeannine Boga M.D.   On: 12/04/2016 00:14     Medications:  Scheduled: . ipratropium-albuterol  3 mL Nebulization QID  . mouth rinse  15 mL Mouth Rinse BID  . sodium chloride flush  3 mL Intravenous Q12H   Continuous: . sodium chloride 50 mL/hr at 12/04/16 0332  . acetylcysteine 15 mg/kg/hr (12/04/16 0156)   RCB:[id number], ondansetron **OR** ondansetron (ZOFRAN) IV  Assessment/Plan:  Principal Problem:   Acetaminophen toxicity Active Problems:   COPD (chronic obstructive pulmonary disease) (HCC)   Tobacco abuse   Hard of hearing   CAD (nonobstructive per cath 2015)   HTN (hypertension)   ALF (acute liver failure)    Acetaminophen toxicity , acute liver failure This appears to be an unintentional overdose while he was treating his migraine headache earlier this week. Acetaminophen levels appear to be decreasing. LFTs significantly elevated. Gastroenterology is following. Patient is on intravenous N-acetylcysteine. Poison control was contacted. Continue to trend LFTs and INR. Ammonia level is normal. Right upper quadrant ultrasound did not show any acute findings. Doppler studies ordered by gastroenterology. Hepatitis panel and HIV is pending. We'll consult critical care medicine to assess patient in case he decompensates. Discussed with Dr. Havery Moros with gastroenterology.  Nonanion gap metabolic acidosis Possibly due to the above. Continue to monitor closely. Continue IV fluids.   Dehydration S/P aggressive hydration in the ED. Gentle maintenance fluids for now.   Concern for sepsis with lactic acidosis in the ED Lactate now normal. Patient was empirically started on broad-spectrum antibiotics. Patient is afebrile. Has a normal WBC. No foci of infection is noted. Blood cultures are pending. I think it's reasonable to stop his  antibiotics for now.  Elevated troponin of unclear significance, history of nonobstructive CAD Troponins are only minimally elevated without any significant rise. EKG was of poor quality. Patient denies any chest pain currently. Proceed with echocardiogram. Repeat EKG. Monitor on telemetry.  History of COPD Appears compensated. Scheduled duonebs while inpatient. Oxygen as needed, O2 sat 92-94%.  Active tobacco use Patient has no plans to stop smoking.   DVT Prophylaxis: SCD's   Code Status: Full code  Family Communication: Unable to reach family  Disposition Plan: Management as outlined above. Will remain in step down unit for now.    LOS: 0 days   [facility] Pager [phone] 12/04/2016, 12:28 PM  If 7PM-7AM, please contact night-coverage at [email or web address], password [facility]

## 2016-12-04 NOTE — Progress Notes (Signed)
CSW called by 4e to inform that APS worker has come to speak with patient.    CSW met with APS worker Seymour Bars [phone]) who reports that concerns had been raised prior to pt admission- pt lives in [place] and concerns that he is incapable of self care at this time  APS worker inquired about placement- CSW informed that we could order to PT to make rehab recommendations but that patient does not have payor source for long term placement   Seymour Bars requested we contact her with any further information about pt plan or concerns for wellbeing- CSW confirmed that pt has PT ordered.  Jorge Ny, LCSW Clinical Social Worker [phone]

## 2016-12-04 NOTE — Plan of Care (Signed)
Problem: Safety: Goal: Ability to remain free from injury will improve Outcome: Progressing Pt alert and oriented; educated on safety measures including use of call light to request assistance. Bed alarm activated; bed is low and brakes are locked.  Problem: Pain Managment: Goal: General experience of comfort will improve Outcome: Progressing No c/o pain or discomfort present at this time

## 2016-12-04 NOTE — Consult Note (Signed)
PULMONARY / CRITICAL CARE MEDICINE   Name: Anthony Bolton MRN: [id number] DOB: March 12, 1951    ADMISSION DATE:  12/03/2016 CONSULTATION DATE:  3/9  REFERRING MD: [facility]  CHIEF COMPLAINT: Chest pain  HISTORY OF PRESENT ILLNESS:   66 yo male, life long smoker, Known CAD/COPD, CHF who was transported per EMS to ED with CC: chest pain and SOB. Trop I 0.006. He was worked up and AST/ALT were elevated. Tylenol levels were elevated at 70 and mucomyst was instituted and he was admitted to SDU. He is functionally deaf(dont know if this is he baseline or has worsened from NSAIS and tylenol ingestion) and is a poor historian. His tylenol levels are declining and follow up LFT will be needed. PCCM will be available PRN.  PAST MEDICAL HISTORY :  He  has a past medical history of Adrenal adenoma, left (11/2015); Anemia (09/2016); CHF (congestive heart failure) ([place]) (10/2013); COPD (chronic obstructive pulmonary disease) ([place]); HLD (hyperlipidemia); HOH (hard of hearing); Migraines; Prostate hypertrophy (09/2016); Protein-calorie malnutrition, severe ([place]) (09/2016); SBO (small bowel obstruction) (03/2015, 09/2016); and Thrombocytopenia ([place]) (09/2016).  PAST SURGICAL HISTORY: He  has a past surgical history that includes Appendectomy; left heart catheterization with coronary angiogram (N/A, 11/07/2013); and cardiac stents.  No Known Allergies  No current facility-administered medications on file prior to encounter.    Current Outpatient Prescriptions on File Prior to Encounter  Medication Sig  . albuterol (PROVENTIL HFA;VENTOLIN HFA) 108 (90 Base) MCG/ACT inhaler Inhale 2 puffs into the lungs every 4 (four) hours as needed for wheezing or shortness of breath.  Marland Kitchen aspirin EC 81 MG tablet Take 1 tablet (81 mg total) by mouth daily.  . budesonide-formoterol (SYMBICORT) 160-4.5 MCG/ACT inhaler Inhale 2 puffs into the lungs 2 (two) times daily.  Marland Kitchen ipratropium-albuterol (DUONEB) 0.5-2.5 (3) MG/3ML SOLN Take 3  mLs by nebulization every 6 (six) hours as needed (wheezing, shortness of breath).  . tiotropium (SPIRIVA) 18 MCG inhalation capsule Place 1 capsule (18 mcg total) into inhaler and inhale daily.  . [DISCONTINUED] ramipril (ALTACE) 1.25 MG capsule Take 1 capsule (1.25 mg total) by mouth daily. (Patient not taking: Reported on 10/10/2014)    FAMILY HISTORY:  His indicated that the status of his mother is unknown.    SOCIAL HISTORY: He  reports that he has been smoking Cigarettes.  He has been smoking about 0.50 packs per day. He has never used smokeless tobacco. He reports that he does not drink alcohol or use drugs.  REVIEW OF SYSTEMS:   NA  SUBJECTIVE:  HOH  VITAL SIGNS: BP (!) 135/97   Pulse (!) 114   Temp 98.7 F (37.1 C) (Axillary)   Resp (!) 24   Ht 5\' 9"  (1.753 m)   Wt 48.8 kg (107 lb 9.4 oz)   SpO2 100%   BMI 15.89 kg/m   HEMODYNAMICS:    VENTILATOR SETTINGS:    INTAKE / OUTPUT: I/O last 3 completed shifts: In: 2814.9 [I.V.:265.9; IV Piggyback:2549] Out: 325 [Urine:325]  PHYSICAL EXAMINATION: General:  Disheveled wm , deaf Neuro:  Deaf but maex 4. Speech is loudly not understandable. HEENT: No JVD/LAN Cardiovascular: HSD RRR Lungs: +rhonchi Abdomen: +bs Musculoskeletal:  Intact Skin:  Rt leg discoloration   LABS:  BMET  Recent Labs Lab 12/03/16 2010 12/04/16 0033 12/04/16 0348  NA 135 137 136  K 4.0 3.7 3.5  CL 102 107 106  CO2 19* 21* 18*  BUN 60* 49* 42*  CREATININE 0.93 0.74 0.76  GLUCOSE 100* 107*  105*    Electrolytes  Recent Labs Lab 12/03/16 2010 12/04/16 0033 12/04/16 0348  CALCIUM 8.5* 7.9* 8.0*    CBC  Recent Labs Lab 12/03/16 2010 12/04/16 0348  WBC 12.0* 9.6  HGB 13.0 12.5*  HCT 38.0* 37.9*  PLT 157 145*    Coag's  Recent Labs Lab 12/03/16 1950 12/04/16 0348  INR 1.53 1.66    Sepsis Markers  Recent Labs Lab 12/03/16 2010 12/03/16 2333  LATICACIDVEN 2.18* 1.52    ABG No results for input(s):  PHART, PCO2ART, PO2ART in the last 168 hours.  Liver Enzymes  Recent Labs Lab 12/03/16 2010 12/04/16 0033 12/04/16 0348  AST 1,744* 1,467* 1,213*  ALT 4,493* 4,248* 4,254*  ALKPHOS 136* 125 116  BILITOT 2.1* 1.7* 1.9*  ALBUMIN 3.2* 3.1* 2.8*    Cardiac Enzymes  Recent Labs Lab 12/04/16 0807  TROPONINI 0.06*    Glucose No results for input(s): GLUCAP in the last 168 hours.  Imaging Dg Chest 2 View  Result Date: 12/03/2016 CLINICAL DATA:  Chest pain EXAM: CHEST  2 VIEW COMPARISON:  10/22/2016, 10/13/2016 FINDINGS: The lungs are hyperinflated and there is bullous emphysematous disease within the upper lobes. There is mild left pleural scarring. There is no acute infiltrate. Stable cardiomediastinal silhouette. No pneumothorax. IMPRESSION: 1. Hyperinflation with bullous emphysematous disease bilaterally. 2. No acute infiltrate Electronically Signed   By: Donavan Foil M.D.   On: 12/03/2016 19:01   Ct Angio Chest Pe W And/or Wo Contrast  Result Date: 12/03/2016 CLINICAL DATA:  Shortness of breath. Chest pain for 1 day, dysuria for 2 days. History of COPD. EXAM: CT ANGIOGRAPHY CHEST WITH CONTRAST TECHNIQUE: Multidetector CT imaging of the chest was performed using the standard protocol during bolus administration of intravenous contrast. Multiplanar CT image reconstructions and MIPs were obtained to evaluate the vascular anatomy. CONTRAST:  100 cc Isovue 370 COMPARISON:  Chest radiograph December 03, 2016 1848 hours and CT chest January 08, 2016 FINDINGS: Mild respiratory motion degraded examination. CARDIOVASCULAR: Adequate contrast opacification of the pulmonary artery's. Main pulmonary artery is not enlarged. No pulmonary arterial filling defects to the level of the subsegmental branches. Respiratory motion results in heterogeneous density RIGHT lower lobe subsegmental pulmonary arteries. Heart size is normal, no right heart strain. No pericardial effusion. Thoracic aorta is normal course and  caliber, LEFT vertebral artery arises directly from the op aortic arch, normal variant. Trace calcific atherosclerosis of the aortic arch. MEDIASTINUM/NODES: No lymphadenopathy by CT size criteria. LUNGS/PLEURA: Tracheobronchial tree is patent, no pneumothorax. Mild chronic bronchial wall thickening. Severe centrilobular emphysema. No pleural effusion or focal consolidation. Hyperventilation. UPPER ABDOMEN: Included view of the abdomen is nonacute. Stable 3.9 cm benign LEFT adrenal adenoma (7 Hounsfield units). MUSCULOSKELETAL: Visualized soft tissues and included osseous structures are nonacute. Old posterior LEFT rib fractures. Respiratory motion at multiple levels results in spurious appearance of rib fractures. Review of the MIP images confirms the above findings. IMPRESSION: No acute pulmonary embolism. Stable emphysema and chronic bronchitis. Stable large LEFT adrenal benign adenoma. Electronically Signed   By: Elon Alas M.D.   On: 12/03/2016 21:58   US Abdomen Limited Ruq  Result Date: 12/04/2016 CLINICAL DATA:  Initial evaluation for elevated liver enzymes, bilirubin. Question hepatitis. EXAM: US ABDOMEN LIMITED - RIGHT UPPER QUADRANT COMPARISON:  Prior radiograph from 10/24/2016. FINDINGS: Gallbladder: No gallstones or wall thickening visualized. No sonographic Murphy sign noted by sonographer. Common bile duct: Diameter: 4.2 mm Liver: No focal lesion identified. Within normal limits in parenchymal echogenicity.  IMPRESSION: Normal right upper quadrant ultrasound. No sonographic evidence for acute abnormality identified. Electronically Signed   By: Jeannine Boga M.D.   On: 12/04/2016 00:14     STUDIES:    CULTURES: 3/8 bcx2>>  ANTIBIOTICS:   SIGNIFICANT EVENTS:   LINES/TUBES:   DISCUSSION: 66 yo male, life long smoker, Known CAD/COPD, CHF who was transported per EMS to ED with CC: chest pain and SOB. Trop I 0.006. He was worked up and AST/ALT were elevated. Tylenol  levels were elevated at 70 and mucomyst was instituted and he was admitted to SDU. He is functionally deaf(dont know if this is he baseline or has worsened from NSAIS and tylenol ingestion) and is a poor historian. His tylenol levels are declining and follow up LFT will be needed. PCCM will be available PRN.   ASSESSMENT / PLAN:  PULMONARY A: Copd with ongoing tobacco abuse Currently O 2 dependent P:   Pulmonary toilet O2 as needed Leave in SDU for now  CARDIOVASCULAR A:  CAD P:  Per [facility]  RENAL Lab Results  Component Value Date   CREATININE 0.76 12/04/2016   CREATININE 0.74 12/04/2016   CREATININE 0.93 12/03/2016    Recent Labs Lab 12/03/16 2010 12/04/16 0033 12/04/16 0348  K 4.0 3.7 3.5       A:   No acute issue P:   Follow labs  GASTROINTESTINAL A:   Elevated lft, ALT 4254, AST 1213 presumed secondary to elevated tylenol levels. Tylenol >70-. 32 ASA nl  P:   Acetylcysteine per pharmacy and [facility] following  HEMATOLOGIC A:   No acute issues P:  Follow labs  INFECTIOUS A:   Being treated for ? pna P:   Consider dc abx  ENDOCRINE A:   No acute issue P:   Follow labs  NEUROLOGIC A:   Extremely HOH ? confusion P:   RASS goal: o No sedation   FAMILY  - Updates: No family at bedside. He is so hard of hearing no meaningful conversation is possible. ? From chronic Nsaid/tylenol use.  - Inter-disciplinary family meet or Palliative Care meeting due by:  day 7    [facility] ACNP Maryanna Shape PCCM Pager [phone] till 3 pm If no answer page [phone] 12/04/2016, 12:33 PM   STAFF NOTE: I, Merrie Roof, MD FACP have personally reviewed patient's available data, including medical history, events of note, physical examination and test results as part of my evaluation. I have discussed with resident/NP and other care providers such as pharmacist, RN and RRT. In addition, I personally evaluated patient and elicited key findings of:  Awake, speaking  But not clearly, not sure about his baseline, lungs clear, liver wnl aize on percussion, bicarb 18, ph wnl, AST./ALT noted, fully alert, this may be chronic tyenal toxicity vs acute, I am reassured by bicarb at 18, lactic and ph, and flat LFT and good neurostatus but concerning with INR and degree of LFt AST rise at peak, would keep in sdu and re assess LFT, inr, neurostatus trends and assess chem in am, maintain acetycysteine UNTIL LFT resolve, does not need ICU at this stage but we are certainly aware of his circumstances, call  Over weekend if changes / declines   Lavon Paganini. Titus Mould, MD, [place] Pgr: [facility] 12/04/2016 2:20 PM

## 2016-12-04 NOTE — Plan of Care (Signed)
Problem: Safety: Goal: Ability to remain free from injury will improve Outcome: Progressing Pt has agreed to call for assistance with toileting, however he has demonstrated that he can be impulsive and unaware of his own personal safety.

## 2016-12-04 NOTE — Progress Notes (Signed)
Initial Nutrition Assessment  DOCUMENTATION CODES:   Severe malnutrition in context of chronic illness, Underweight  INTERVENTION:    Advance diet as medically appropriate, add supplements when/as able  NUTRITION DIAGNOSIS:   Malnutrition related to chronic illness as evidenced by severe depletion of body fat, severe depletion of muscle mass  GOAL:   Patient will meet greater than or equal to 90% of their needs  MONITOR:   PO intake, Supplement acceptance, Labs, Weight trends, I & O's  REASON FOR ASSESSMENT:   Malnutrition Screening Tool  ASSESSMENT:   66 y/o Male with history of CHF, COPD, CAD, hearing loss, who presented to the ER with shortness of breath and a headache. He is a very poor historian and difficult to clarify what and how much he took for headache treatment, but reported to tylenol intake for headache. In the ER he had an elevated lactic acid, CXR showed COPD changes, also had CTA chest which ruled out PE. EKG showed some ST depression and mildly positive troponin, cardiology contacted with low suspicion for ACS.   Pt presented with acute liver injury, profound transaminitis and newly elevated INR. RD unable to obtain nutrition hx; pt unresponsive to interview questions. Pt seen per Clinical Nutrition during previous hospital admissions. Identified with severe malnutrition which is ongoing. Labs reviewed.  BUN 42 (H).  Calcium 8.0 (L). Medications reviewed.  Nutrition-Focused physical exam completed. Findings are severe fat depletion, severe muscle depletion, and no edema.   Diet Order:  Diet NPO time specified Except for: Ice Chips, Sips with Meds  Skin:  Reviewed, no issues  Last BM:  N/A  Height:   Ht Readings from Last 1 Encounters:  12/04/16 5\' 9"  (1.753 m)    Weight:   Wt Readings from Last 1 Encounters:  12/04/16 107 lb 9.4 oz (48.8 kg)    Ideal Body Weight:  73 kg  BMI:  Body mass index is 15.89 kg/m.  Estimated Nutritional Needs:    Kcal:  1700-1900  Protein:  80-90 gm  Fluid:  1.7-1.9 L  EDUCATION NEEDS:   No education needs identified at this time  Arthur Holms, RD, LDN Pager #: [phone] After-Hours Pager #: [phone]

## 2016-12-05 DIAGNOSIS — R778 Other specified abnormalities of plasma proteins: Secondary | ICD-10-CM

## 2016-12-05 DIAGNOSIS — R7989 Other specified abnormal findings of blood chemistry: Secondary | ICD-10-CM

## 2016-12-05 DIAGNOSIS — R740 Nonspecific elevation of levels of transaminase and lactic acid dehydrogenase [LDH]: Secondary | ICD-10-CM

## 2016-12-05 DIAGNOSIS — Z720 Tobacco use: Secondary | ICD-10-CM

## 2016-12-05 DIAGNOSIS — R079 Chest pain, unspecified: Secondary | ICD-10-CM

## 2016-12-05 DIAGNOSIS — J42 Unspecified chronic bronchitis: Secondary | ICD-10-CM

## 2016-12-05 LAB — COMPREHENSIVE METABOLIC PANEL
ALK PHOS: 118 U/L (ref 38–126)
ALT: 2587 U/L — AB (ref 17–63)
AST: 397 U/L — ABNORMAL HIGH (ref 15–41)
Albumin: 2.4 g/dL — ABNORMAL LOW (ref 3.5–5.0)
Anion gap: 11 (ref 5–15)
BUN: 25 mg/dL — ABNORMAL HIGH (ref 6–20)
CALCIUM: 8.2 mg/dL — AB (ref 8.9–10.3)
CO2: 19 mmol/L — AB (ref 22–32)
CREATININE: 0.57 mg/dL — AB (ref 0.61–1.24)
Chloride: 110 mmol/L (ref 101–111)
GFR calc non Af Amer: >60 mL/min (ref >60)
GLUCOSE: 108 mg/dL — AB (ref 65–99)
Potassium: 3.8 mmol/L (ref 3.5–5.1)
SODIUM: 140 mmol/L (ref 135–145)
Total Bilirubin: 1.3 mg/dL — ABNORMAL HIGH (ref 0.3–1.2)
Total Protein: 4.8 g/dL — ABNORMAL LOW (ref 6.5–8.1)

## 2016-12-05 LAB — CBC
HEMATOCRIT: 33.5 % — AB (ref 39.0–52.0)
Hemoglobin: 10.9 g/dL — ABNORMAL LOW (ref 13.0–17.0)
MCH: 29 pg (ref 26.0–34.0)
MCHC: 32.5 g/dL (ref 30.0–36.0)
MCV: 89.1 fL (ref 78.0–100.0)
Platelets: 115 10*3/uL — ABNORMAL LOW (ref 150–400)
RBC: 3.76 MIL/uL — ABNORMAL LOW (ref 4.22–5.81)
RDW: 14.6 % (ref 11.5–15.5)
WBC: 3.6 10*3/uL — ABNORMAL LOW (ref 4.0–10.5)

## 2016-12-05 LAB — CERULOPLASMIN: CERULOPLASMIN: 21.5 mg/dL (ref 16.0–31.0)

## 2016-12-05 LAB — HEPATITIS PANEL, ACUTE
HCV Ab: <0.1 s/co ratio (ref 0.0–0.9)
HEP B S AG: NEGATIVE
Hep A IgM: NEGATIVE
Hep B C IgM: NEGATIVE

## 2016-12-05 LAB — IGG: IGG (IMMUNOGLOBIN G), SERUM: 581 mg/dL — AB (ref 700–1600)

## 2016-12-05 LAB — ACETAMINOPHEN LEVEL: Acetaminophen (Tylenol), Serum: <10 ug/mL — ABNORMAL LOW (ref 10–30)

## 2016-12-05 LAB — PROTIME-INR
INR: 1.39
Prothrombin Time: 17.2 seconds — ABNORMAL HIGH (ref 11.4–15.2)

## 2016-12-05 LAB — HIV ANTIBODY (ROUTINE TESTING W REFLEX): HIV Screen 4th Generation wRfx: NONREACTIVE

## 2016-12-05 MED ORDER — LEVALBUTEROL HCL 0.63 MG/3ML IN NEBU
0.6300 mg | INHALATION_SOLUTION | RESPIRATORY_TRACT | Status: DC | PRN
  Administered 2016-12-05 – 2016-12-10 (×7): 0.63 mg via RESPIRATORY_TRACT
  Filled 2016-12-05 (×7): qty 3

## 2016-12-05 MED ORDER — IPRATROPIUM BROMIDE 0.02 % IN SOLN
0.5000 mg | RESPIRATORY_TRACT | Status: DC | PRN
  Administered 2016-12-05 – 2016-12-07 (×3): 0.5 mg via RESPIRATORY_TRACT
  Filled 2016-12-05 (×3): qty 2.5

## 2016-12-05 MED ORDER — MOMETASONE FURO-FORMOTEROL FUM 100-5 MCG/ACT IN AERO
2.0000 | INHALATION_SPRAY | Freq: Two times a day (BID) | RESPIRATORY_TRACT | Status: DC
  Administered 2016-12-05 – 2016-12-10 (×8): 2 via RESPIRATORY_TRACT
  Filled 2016-12-05 (×2): qty 8.8

## 2016-12-05 MED ORDER — PANTOPRAZOLE SODIUM 40 MG PO TBEC
40.0000 mg | DELAYED_RELEASE_TABLET | Freq: Every day | ORAL | Status: DC
  Administered 2016-12-05 – 2016-12-10 (×6): 40 mg via ORAL
  Filled 2016-12-05 (×6): qty 1

## 2016-12-05 MED ORDER — TIOTROPIUM BROMIDE MONOHYDRATE 18 MCG IN CAPS
18.0000 ug | ORAL_CAPSULE | Freq: Every day | RESPIRATORY_TRACT | Status: DC
  Administered 2016-12-05 – 2016-12-10 (×5): 18 ug via RESPIRATORY_TRACT
  Filled 2016-12-05 (×2): qty 5

## 2016-12-05 MED ORDER — IBUPROFEN 400 MG PO TABS
400.0000 mg | ORAL_TABLET | Freq: Three times a day (TID) | ORAL | Status: DC | PRN
  Administered 2016-12-05 – 2016-12-10 (×6): 400 mg via ORAL
  Filled 2016-12-05 (×3): qty 1
  Filled 2016-12-05: qty 2
  Filled 2016-12-05: qty 1
  Filled 2016-12-05: qty 2
  Filled 2016-12-05: qty 1
  Filled 2016-12-05: qty 2

## 2016-12-05 MED ORDER — NICOTINE 14 MG/24HR TD PT24
14.0000 mg | MEDICATED_PATCH | Freq: Every day | TRANSDERMAL | Status: DC
  Administered 2016-12-05 – 2016-12-10 (×6): 14 mg via TRANSDERMAL
  Filled 2016-12-05 (×6): qty 1

## 2016-12-05 NOTE — Progress Notes (Signed)
[facility] PROGRESS NOTE  Anthony Bolton JEH:[id number] DOB: [birth year] DOA: 12/03/2016  PCP: [name], MD  Brief History/Interval Summary: 66 y.o. gentleman with a history of nonobstructing CAD, COPD (he is not on home oxygen), active tobacco use with dependence, prior CHF (LV systolic function normal on last echo in 2016), and hard of hearing who called EMS for evaluation of chest pain and shortness of breath. The patient is a poor historian. Patient was experiencing migraine headaches and took unclear amount of acetaminophen, NSAIDs and aspirin. No clear reason was found for his chest pain or shortness of breath. However, he was found to have elevated liver function tests. He also had elevated Tylenol level. He was admitted for further management of Tylenol toxicity.   Reason for Visit: Acetaminophen toxicity with acute liver failure  Consultants: Gastroenterology. Critical care medicine.  Procedures: None  Antibiotics: He was started on vancomycin and cefepime which was discontinued on 3/9.  Subjective/Interval History: Patient is much more calm today compared to yesterday. He does have hearing impairment. Denies any chest pain this morning. States that he is hungry.   ROS: Unable to do as he is extremely poor historian  Objective:  Vital Signs  Vitals:   12/04/16 1652 12/04/16 1955 12/04/16 2340 12/05/16 0400  BP: (!) 127/93 (!) 131/97 (!) 127/98 (!) 136/96  Pulse:  (!) 116 (!) 119 (!) 112  Resp:  (!) 25 (!) 29 19  Temp: 98.3 F (36.8 C) 97.3 F (36.3 C) 97.9 F (36.6 C) 97.4 F (36.3 C)  TempSrc: Oral Oral Axillary Oral  SpO2:  99% 99% 100%  Weight:      Height:        Intake/Output Summary (Last 24 hours) at 12/05/16 0733 Last data filed at 12/05/16 0600  Gross per 24 hour  Intake             1812 ml  Output             1105 ml  Net              707 ml   Filed Weights   12/03/16 1821 12/03/16 2010 12/04/16 0318  Weight: 49.9 kg (110 lb) 52.2  kg (115 lb) 48.8 kg (107 lb 9.4 oz)    General appearance: alert, distracted and no distress Resp: clear to auscultation bilaterally Cardio: regular rate and rhythm, S1, S2 normal, no murmur, click, rub or gallop GI: soft, non-tender; bowel sounds normal; no masses,  no organomegaly Extremities: extremities normal, atraumatic, no cyanosis or edema Neurologic: Awake and alert. Somewhat distracted. No obvious facial asymmetry. Moving all his extremities.  Lab Results:  Data Reviewed: I have personally reviewed following labs and imaging studies  CBC:  Recent Labs Lab 12/03/16 2010 12/04/16 0348 12/05/16 0136  WBC 12.0* 9.6 3.6*  NEUTROABS 11.2*  --   --   HGB 13.0 12.5* 10.9*  HCT 38.0* 37.9* 33.5*  MCV 86.8 88.8 89.1  PLT 157 145* 115*    Basic Metabolic Panel:  Recent Labs Lab 12/03/16 2010 12/04/16 0033 12/04/16 0348 12/05/16 0136  NA 135 137 136 140  K 4.0 3.7 3.5 3.8  CL 102 107 106 110  CO2 19* 21* 18* 19*  GLUCOSE 100* 107* 105* 108*  BUN 60* 49* 42* 25*  CREATININE 0.93 0.74 0.76 0.57*  CALCIUM 8.5* 7.9* 8.0* 8.2*    GFR: Estimated Creatinine Clearance: 62.7 mL/min (by C-G formula based on SCr of 0.57 mg/dL (L)).  Liver Function  Tests:  Recent Labs Lab 12/03/16 2010 12/04/16 0033 12/04/16 0348 12/04/16 1324 12/05/16 0136  AST 1,744* 1,467* 1,213* 722* 397*  ALT 4,493* 4,248* 4,254* 3,596* 2,587*  ALKPHOS 136* 125 116 105 118  BILITOT 2.1* 1.7* 1.9* 1.4* 1.3*  PROT 5.8* 5.7* 5.4* 5.0* 4.8*  ALBUMIN 3.2* 3.1* 2.8* 2.7* 2.4*     Recent Labs Lab 12/03/16 2333  LIPASE 23    Recent Labs Lab 12/04/16 0108  AMMONIA 27    Coagulation Profile:  Recent Labs Lab 12/03/16 1950 12/04/16 0348 12/04/16 1324 12/05/16 0136  INR 1.53 1.66 1.57 1.39    Cardiac Enzymes:  Recent Labs Lab 12/04/16 0807 12/04/16 1324 12/04/16 2015  TROPONINI 0.06* 0.10* 0.05*    Recent Results (from the past 240 hour(s))  Blood Culture (routine x 2)      Status: None (Preliminary result)   Collection Time: 12/03/16  7:25 PM  Result Value Ref Range Status   Specimen Description BLOOD LEFT HAND  Final   Special Requests BOTTLES DRAWN AEROBIC AND ANAEROBIC 5CC EACH  Final   Culture   Final    NO GROWTH < 12 HOURS Performed at [facility], [address], [place], [place] [address]    Report Status PENDING  Incomplete  Blood Culture (routine x 2)     Status: None (Preliminary result)   Collection Time: 12/03/16  7:50 PM  Result Value Ref Range Status   Specimen Description BLOOD BLOOD RIGHT FOREARM  Final   Special Requests BOTTLES DRAWN AEROBIC AND ANAEROBIC 5CC EACH  Final   Culture   Final    NO GROWTH < 12 HOURS Performed at [facility], [address], [place], [place] [address]    Report Status PENDING  Incomplete  MRSA PCR Screening     Status: None   Collection Time: 12/04/16  3:27 AM  Result Value Ref Range Status   MRSA by PCR NEGATIVE NEGATIVE Final    Comment:        The GeneXpert MRSA Assay (FDA approved for NASAL specimens only), is one component of a comprehensive MRSA colonization surveillance program. It is not intended to diagnose MRSA infection nor to guide or monitor treatment for MRSA infections.       Radiology Studies: Dg Chest 2 View  Result Date: 12/03/2016 CLINICAL DATA:  Chest pain EXAM: CHEST  2 VIEW COMPARISON:  10/22/2016, 10/13/2016 FINDINGS: The lungs are hyperinflated and there is bullous emphysematous disease within the upper lobes. There is mild left pleural scarring. There is no acute infiltrate. Stable cardiomediastinal silhouette. No pneumothorax. IMPRESSION: 1. Hyperinflation with bullous emphysematous disease bilaterally. 2. No acute infiltrate Electronically Signed   By: Donavan Foil M.D.   On: 12/03/2016 19:01   Ct Angio Chest Pe W And/or Wo Contrast  Result Date: 12/03/2016 CLINICAL DATA:  Shortness of breath. Chest pain for 1 day, dysuria for 2 days. History of COPD.  EXAM: CT ANGIOGRAPHY CHEST WITH CONTRAST TECHNIQUE: Multidetector CT imaging of the chest was performed using the standard protocol during bolus administration of intravenous contrast. Multiplanar CT image reconstructions and MIPs were obtained to evaluate the vascular anatomy. CONTRAST:  100 cc Isovue 370 COMPARISON:  Chest radiograph December 03, 2016 1848 hours and CT chest January 08, 2016 FINDINGS: Mild respiratory motion degraded examination. CARDIOVASCULAR: Adequate contrast opacification of the pulmonary artery's. Main pulmonary artery is not enlarged. No pulmonary arterial filling defects to the level of the subsegmental branches. Respiratory motion results in  heterogeneous density RIGHT lower lobe subsegmental pulmonary arteries. Heart size is normal, no right heart strain. No pericardial effusion. Thoracic aorta is normal course and caliber, LEFT vertebral artery arises directly from the op aortic arch, normal variant. Trace calcific atherosclerosis of the aortic arch. MEDIASTINUM/NODES: No lymphadenopathy by CT size criteria. LUNGS/PLEURA: Tracheobronchial tree is patent, no pneumothorax. Mild chronic bronchial wall thickening. Severe centrilobular emphysema. No pleural effusion or focal consolidation. Hyperventilation. UPPER ABDOMEN: Included view of the abdomen is nonacute. Stable 3.9 cm benign LEFT adrenal adenoma (7 Hounsfield units). MUSCULOSKELETAL: Visualized soft tissues and included osseous structures are nonacute. Old posterior LEFT rib fractures. Respiratory motion at multiple levels results in spurious appearance of rib fractures. Review of the MIP images confirms the above findings. IMPRESSION: No acute pulmonary embolism. Stable emphysema and chronic bronchitis. Stable large LEFT adrenal benign adenoma. Electronically Signed   By: Elon Alas M.D.   On: 12/03/2016 21:58   US Abdomen Limited Ruq  Result Date: 12/04/2016 CLINICAL DATA:  Initial evaluation for elevated liver enzymes,  bilirubin. Question hepatitis. EXAM: US ABDOMEN LIMITED - RIGHT UPPER QUADRANT COMPARISON:  Prior radiograph from 10/24/2016. FINDINGS: Gallbladder: No gallstones or wall thickening visualized. No sonographic Murphy sign noted by sonographer. Common bile duct: Diameter: 4.2 mm Liver: No focal lesion identified. Within normal limits in parenchymal echogenicity. IMPRESSION: Normal right upper quadrant ultrasound. No sonographic evidence for acute abnormality identified. Electronically Signed   By: Jeannine Boga M.D.   On: 12/04/2016 00:14     Medications:  Scheduled: . mouth rinse  15 mL Mouth Rinse BID  . mometasone-formoterol  2 puff Inhalation BID  . sodium chloride flush  3 mL Intravenous Q12H  . tiotropium  18 mcg Inhalation Daily   Continuous: . sodium chloride 50 mL/hr at 12/04/16 2307   OMV:[id number], levalbuterol, ondansetron **OR** ondansetron (ZOFRAN) IV  Assessment/Plan:  Principal Problem:   Acetaminophen toxicity Active Problems:   COPD (chronic obstructive pulmonary disease) (HCC)   Tobacco abuse   Hard of hearing   CAD (nonobstructive per cath 2015)   HTN (hypertension)   ALF (acute liver failure)   Acetaminophen overdose of undetermined intent    Acetaminophen toxicity , acute liver injury This appears to be an unintentional overdose while he was treating his migraine headache earlier this week. Acetaminophen levels were elevated. Patient was started on intravenous N-acetylcysteine. Poison control was contacted. Patient's acetaminophen levels have become undetectable. LFTs are trending down. INR has improved. N-acetylcysteine was discontinued overnight per poison control. Ammonia level was normal. Right upper quadrant ultrasound did not show any acute findings. Doppler studies ordered by gastroenterology. Hepatitis panel is negative. HIV is nonreactive. Appreciate critical care medicine input. Gastroenterology continues to follow.   Nonanion gap metabolic  acidosis Possibly due to the above. Stable. Continue to monitor closely. Continue IV fluids.   Dehydration S/P aggressive hydration in the ED. Gentle maintenance fluids for now. Hydration status has improved.  Concern for sepsis with lactic acidosis in the ED Lactate now normal. Patient was empirically started on broad-spectrum antibiotics. Patient is afebrile. Has a normal WBC. No foci of infection is noted. Blood cultures are negative so far. Antibiotics were discontinued. He remains afebrile. WBC remains normal.  Elevated troponin of unclear significance, history of nonobstructive CAD Troponins are only minimally elevated without any significant rise. EKG was of poor quality. Repeat EKG did not show ischemic changes. Patient denies any chest pain currently. Echocardiogram is pending. Thyroid function test will be ordered. He is noted to  be on albuterol nebulizers, which could be the reason for his tachycardia. Switch over to Xopenex.   History of COPD Appears compensated. Seems to be stable. Nebulizer treatments as needed.  Active tobacco use Patient has no plans to stop smoking. Nicotine patch.  DVT Prophylaxis: SCD's   Code Status: Full code  Family Communication: Discussed with Son.  Disposition Plan: Management as outlined above. Will remain in step down unit for today.    LOS: 1 day   [facility] Pager [phone] 12/05/2016, 7:33 AM  If 7PM-7AM, please contact night-coverage at [email or web address], password [facility]

## 2016-12-05 NOTE — Progress Notes (Signed)
MEDICATION RELATED CONSULT NOTE - Follow-up    Pharmacy Consult for Acetadote Indication: Tylenol overdose  No Known Allergies  Patient Measurements: Height: 5\' 9"  (175.3 cm) Weight: 107 lb 9.4 oz (48.8 kg) IBW/kg (Calculated) : 70.7   Vital Signs: Temp: 97.9 F (36.6 C) (03/09 2340) Temp Source: Axillary (03/09 2340) BP: 127/98 (03/09 2340) Pulse Rate: 119 (03/09 2340) Intake/Output from previous day: 03/09 0701 - 03/10 0700 In: 1512 [P.O.:120; I.V.:1392] Out: 1105 [Urine:1105] Intake/Output from this shift: Total I/O In: 348 [I.V.:348] Out: 275 [Urine:275]  Labs:  Recent Labs  12/03/16 2010 12/04/16 0033 12/04/16 0348 12/04/16 1324 12/05/16 0136  WBC 12.0*  --  9.6  --  3.6*  HGB 13.0  --  12.5*  --  10.9*  HCT 38.0*  --  37.9*  --  33.5*  PLT 157  --  145*  --  115*  CREATININE 0.93 0.74 0.76  --  0.57*  ALBUMIN 3.2* 3.1* 2.8* 2.7* 2.4*  PROT 5.8* 5.7* 5.4* 5.0* 4.8*  AST 1,744* 1,467* 1,213* 722* 397*  ALT 4,493* 4,248* 4,254* 3,596* 2,587*  ALKPHOS 136* 125 116 105 118  BILITOT 2.1* 1.7* 1.9* 1.4* 1.3*  BILIDIR  --   --   --  0.7*  --   IBILI  --   --   --  0.7  --    Estimated Creatinine Clearance: 62.7 mL/min (by C-G formula based on SCr of 0.57 mg/dL (L)).  Assessment: 18 yoM admitted with Tylenol overdose.   3/10 0130 labs: ALT 2587, AST 397, INR 1.39, CO2 19, APAP <10  Spoke with [facility] who recommends discontinuing Acetadote at this time with INR <  2, APAP < 10, and LFTs trending down.  Goal of Therapy:  Prevent liver toxicity  Plan:  Acetadote discontinued by [facility] on call NP per Poison Control recommendations Pharmacy to sign off - please reconsult if needed  Sherlon Handing, PharmD, BCPS Clinical pharmacist, pager [phone] 12/05/2016,3:09 AM

## 2016-12-05 NOTE — Clinical Social Work Note (Addendum)
Clinical Social Work Assessment  Patient Details  Name: Anthony Bolton MRN: [id number] Date of Birth: 07/12/1951  Date of referral:  12/05/16               Reason for consult:  Abuse/Neglect, Facility Placement, Discharge Planning, Family Concerns                Permission sought to share information with:  Case Manager, Family Supports, Other (APS) Permission granted to share information::     Name::        Agency::  APS (involved with self neglect)  Relationship::  Anthony Bolton, son (best contact)   Contact Information:     Housing/Transportation Living arrangements for the past 2 months:  Hotel/Motel IT trainer) Source of Information:  Scientist, water quality, Tourist information centre manager, Adult Children, Other (Comment Required) (APS) Patient Interpreter Needed:  None Criminal Activity/Legal Involvement Pertinent to Current Situation/Hospitalization:  No - Comment as needed Significant Relationships:  Warehouse manager, Adult Children, Friend Lives with:  Self Do you feel safe going back to the place where you live?  No Need for family participation in patient care:  Yes (Comment) (APS involved, unable to care for himself)  Care giving concerns:  LCSW made contact with MD and FSF:SELTRVU regarding care of patient prior to admission. Patient was living in the [facility] [employer]) well over a year and was independent with ADLs.  Reports motel cleans every 7 days and found patient in his room covered in his urine and blood and feces which alerted EMS and son regarding situation.   Son reports patient did have [facility] and had an RN assigned to him for assistance, however he lost the [place] and has not applied for medicaid.  Reports this has been in the last few weeks has has lost his RN.  Reports his RN was helping him with ALF placement and a free hearing aide as he is very HOH but he refused both.  Son reports he can be very stubborn.    Son reports patient cannot care for himself and cannot  return to the motel.  Son lost touch with patient for a few months, but knew he was going back and forth staying at the [facility] in which he left his name and number if needs or in the event of an emergency.  Son is willing to assist with help for patient and does feel SNF would be best option for patient at DC as his motel room was covered in blood and feces, bed bugs and his weight was significantly low.  LCSW explained APS was involved and son agreed as he feels patient cannot make decisions for himself. He denies patient has hx of SA or MH or cognitive problems, just cannot care for himself.   At this time we are awaiting PT/OT consult as well as patient to improve medically.  Per notes: chart review about a year ago, patient was working with partnership for community care and also LCSW at [employer] for placement at ALF: [facility]) in which he was accepted and last minute declined because he wanted to be able to smoke cigarettes and come and go as he pleases.  He was discharged back to the motel at that time.  He was then seen in early 2018 by [facility] SW where he again went back to the motel and has steadily declined since then.    Social Worker assessment / plan:  Completed consult and assessment.  Son  in agreement and feels this is the safest plan for patient as he does not have anywhere to go at discharge due to unsafe living situation. APS remains involved, call placed this weekend to worker with son's information. PT/OT consult pending.  LCSW completed FL2.   Employment status:  Retired Forensic scientist:  Medicare PT Recommendations:  Not assessed at this time [place] / Referral to community resources:  [place]  Patient/Family's Response to care:  Son concerned with patient and worried about his current health and disposition.  Apprecitiave of assistance from MD and APS.  Patient/Family's Understanding of and Emotional Response to Diagnosis, Current  Treatment, and Prognosis:  Family understands current needs and prognosis. Patient is confused, HOH and difficult to engage to explain situation/plan.  Emotional Assessment Appearance:  Appears older than stated age Attitude/Demeanor/Rapport:  Apprehensive Affect (typically observed):  Frustrated (hard of hearing) Orientation:  Oriented to Self, Oriented to Place Alcohol / Substance use:  Not Applicable Psych involvement (Current and /or in the community):  No (Comment)  Discharge Needs  Concerns to be addressed:  Adjustment to Illness, Discharge Planning Concerns Readmission within the last 30 days:  Yes Current discharge risk:  Other (refusal of services/capacity) Barriers to Discharge:  Continued Medical Work up   [place], LCSW 12/05/2016, 11:06 AM

## 2016-12-05 NOTE — Plan of Care (Signed)
Problem: Skin Integrity: Goal: Risk for impaired skin integrity will decrease Outcome: Progressing Discussed hand hygiene with using the urinal with sanitizer given and some teach back displayed.

## 2016-12-05 NOTE — Evaluation (Signed)
Physical Therapy Evaluation Patient Details Name: Anthony Bolton MRN: [id number] DOB: [birth year] Today's Date: 12/05/2016   History of Present Illness  Pt is a 66 y/o male admitted secondary to chest pain and SOB, found to have acetaminophen toxicity. PMH including but not limited to COPD and CHF.  Clinical Impression  Pt presented supine in bed with HOB elevated, awake and willing to participate in therapy session. Pt is extremely HOH which made communication very difficult. Pt also becoming agitated with questions regarding mobility. Unsure of PLOF as there was no family or caregiver present. Pt performed bed mobility with mod I and sit-to-stand transfer with min A for stability. Pt would continue to benefit from skilled physical therapy services at this time while admitted and after d/c to address his below listed limitations in order to improve his overall safety and independence with functional mobility.      Follow Up Recommendations SNF    Equipment Recommendations  None recommended by PT    Recommendations for Other Services       Precautions / Restrictions Precautions Precautions: Fall Restrictions Weight Bearing Restrictions: No      Mobility  Bed Mobility Overal bed mobility: Modified Independent                Transfers Overall transfer level: Needs assistance Equipment used: None Transfers: Sit to/from Stand Sit to Stand: Min assist         General transfer comment: min A for stability  Ambulation/Gait             General Gait Details: pt refused to ambulate or further participate in evaluation  Stairs            Wheelchair Mobility    Modified Rankin (Stroke Patients Only)       Balance Overall balance assessment: Needs assistance Sitting-balance support: Feet supported Sitting balance-Leahy Scale: Fair     Standing balance support: During functional activity;Single extremity supported Standing balance-Leahy Scale:  Poor Standing balance comment: pt reliant on at least one UE support                             Pertinent Vitals/Pain Pain Assessment: Faces Faces Pain Scale: No hurt    Home Living Family/patient expects to be discharged to:: Other (Comment)                 Additional Comments: per pt's medical record, pt lives at a motel    Prior Function           Comments: pt would not answer any questions and very HOH, making communication difficult     Hand Dominance        Extremity/Trunk Assessment   Upper Extremity Assessment Upper Extremity Assessment: Overall WFL for tasks assessed    Lower Extremity Assessment Lower Extremity Assessment: Generalized weakness       Communication   Communication: HOH  Cognition Arousal/Alertness: Awake/alert Behavior During Therapy: WFL for tasks assessed/performed Overall Cognitive Status: Difficult to assess                      General Comments      Exercises     Assessment/Plan    PT Assessment Patient needs continued PT services  PT Problem List Decreased strength;Decreased activity tolerance;Decreased balance;Decreased mobility;Decreased coordination;Decreased cognition;Decreased knowledge of use of DME;Decreased safety awareness;Cardiopulmonary status limiting activity       PT Treatment Interventions DME  instruction;Gait training;Stair training;Functional mobility training;Therapeutic activities;Therapeutic exercise;Balance training;Neuromuscular re-education;Cognitive remediation;Patient/family education    PT Goals (Current goals can be found in the Care Plan section)  Acute Rehab PT Goals Patient Stated Goal: to pee PT Goal Formulation: With patient Time For Goal Achievement: 12/19/16 Potential to Achieve Goals: Fair    Frequency Min 3X/week   Barriers to discharge        Co-evaluation               End of Session Equipment Utilized During Treatment: Oxygen Activity  Tolerance: Treatment limited secondary to agitation Patient left: in bed;with call bell/phone within reach;with bed alarm set Nurse Communication: Mobility status PT Visit Diagnosis: Other abnormalities of gait and mobility (R26.89)         Time: 5974-7185 PT Time Calculation (min) (ACUTE ONLY): 11 min   Charges:   PT Evaluation $PT Eval Moderate Complexity: 1 Procedure     PT G CodesClearnce Sorrel Markel Kurtenbach 12/05/2016, 12:53 PM Sherie Don, [place], DPT [phone]

## 2016-12-05 NOTE — Evaluation (Signed)
Clinical/Bedside Swallow Evaluation Patient Details  Name: MELFORD TULLIER MRN: [id number] Date of Birth: March 27, 1951  Today's Date: 12/05/2016 Time: SLP Start Time (ACUTE ONLY): 1535 SLP Stop Time (ACUTE ONLY): 1545 SLP Time Calculation (min) (ACUTE ONLY): 10 min  Past Medical History:  Past Medical History:  Diagnosis Date  . Adrenal adenoma, left 11/2015   adenoma on 2015 MRI, stable per CT 11/2015.   Marland Kitchen Anemia 09/2016   normocytic.  Hgb 10.2 on 10/25/16  . CHF (congestive heart failure) ([place]) 10/2013   per [facility]: LVEF 20-25% 10/2013, 50-50% 07/2015  . COPD (chronic obstructive pulmonary disease) ([place])   . HLD (hyperlipidemia)   . HOH (hard of hearing)   . Migraines   . Prostate hypertrophy 09/2016   per CT 10/22/16.    . Protein-calorie malnutrition, severe ([place]) 09/2016   adult failure to thrive dx during 09/2016 admission  . SBO (small bowel obstruction) 03/2015, 09/2016  . Thrombocytopenia ([place]) 09/2016   platelets 126K 10/25/16.     Past Surgical History:  Past Surgical History:  Procedure Laterality Date  . APPENDECTOMY    . cardiac stents     No stent history  . LEFT HEART CATHETERIZATION WITH CORONARY ANGIOGRAM N/A 11/07/2013   Procedure: LEFT HEART CATHETERIZATION WITH CORONARY ANGIOGRAM;  Surgeon: Clent Demark, MD;  Location: [facility] C[facility] LAB;  Service: Cardiovascular;  Laterality: N/A;   HPI:  Pt is a 66 y/o male admitted secondary to chest pain and SOB, found to have acetaminophen toxicity. PMH including but not limited to COPD and CHF. Cleaning staff found patient in his motel room covered in his urine and blood and feces which alerted EMS and son regarding situation. CXR 12/03/16 showed hyperinflation with bullous emphysematous disease bilaterally, no acute infiltrate. No prior swallowing evaluations in chart.    Assessment / Plan / Recommendation Clinical Impression  Patient presents with oropharyngeal swallow which appears grossly within functional limits at bedside and  appearance of adequate airway protection. Patient HOH and was slightly irritiated during evaluation, limiting ability to complete full oral motor examination. No overt signs of aspiration noted despite challenging with multiple sips of thin liquid via cup. Patient with prolonged mastication of regular solid, secondary to lack of dentition. Patient states he typically avoids harder foods such as salad, raw vegetables. Recommend initiating soft diet (dys 3) with thin liquids, medications whole with liquid. No further follow-up with SLP recommended at this time. SLP will s/o.  SLP Visit Diagnosis: Dysphagia, unspecified (R13.10)    Aspiration Risk  Mild aspiration risk    Diet Recommendation Dysphagia 3 (Mech soft);Thin liquid   Liquid Administration via: Cup Medication Administration: Whole meds with liquid Supervision: Patient able to self feed Compensations: Minimize environmental distractions;Slow rate;Small sips/bites Postural Changes: Seated upright at 90 degrees    Other  Recommendations Oral Care Recommendations: Oral care BID   Follow up Recommendations None      Frequency and Duration            Prognosis Prognosis for Safe Diet Advancement: Good      Swallow Study   General Date of Onset: 12/03/16 HPI: Pt is a 66 y/o male admitted secondary to chest pain and SOB, found to have acetaminophen toxicity. PMH including but not limited to COPD and CHF. Cleaning staff found patient in his motel room covered in his urine and blood and feces which alerted EMS and son regarding situation. CXR 12/03/16 showed hyperinflation with bullous emphysematous disease bilaterally, no acute infiltrate. No  prior swallowing evaluations in chart.  Type of Study: Bedside Swallow Evaluation Previous Swallow Assessment: none in chart Diet Prior to this Study: Thin liquids Temperature Spikes Noted: No Respiratory Status: Nasal cannula History of Recent Intubation: No Behavior/Cognition:  Alert;Cooperative Oral Cavity Assessment: Within Functional Limits Oral Care Completed by SLP: No Oral Cavity - Dentition: Edentulous Vision: Functional for self-feeding Self-Feeding Abilities: Able to feed self Patient Positioning: Upright in bed Baseline Vocal Quality: Hoarse Volitional Cough: Cognitively unable to elicit Volitional Swallow:  (pt reduced ability to follow commands)    Oral/Motor/Sensory Function Overall Oral Motor/Sensory Function: Within functional limits   Ice Chips Ice chips: Within functional limits Presentation: Spoon   Thin Liquid Thin Liquid: Within functional limits Presentation: Cup    Nectar Thick Nectar Thick Liquid: Not tested   Honey Thick Honey Thick Liquid: Not tested   Puree Puree: Within functional limits   Solid   GO   Solid: Impaired Presentation: Self Fed Oral Phase Impairments: Impaired mastication Oral Phase Functional Implications: Impaired mastication        Aliene Altes 12/05/2016,3:54 PM  Deneise Lever, [place] CF-SLP Speech-Language Pathologist [phone]

## 2016-12-05 NOTE — Progress Notes (Signed)
Progress Note   Subjective  Patient hungry and asking to eat. Hard of hearing, difficult for him to understand questions. He does endorse some heartburn which is bothering him. Otherwise mental status appears stable compared to yesterday   Objective   Vital signs in last 24 hours: Temp:  [97.3 F (36.3 C)-98.3 F (36.8 C)] 97.9 F (36.6 C) (03/10 0800) Pulse Rate:  [104-119] 112 (03/10 0400) Resp:  [18-29] 19 (03/10 0400) BP: (124-137)/(93-100) 136/96 (03/10 0400) SpO2:  [99 %-100 %] 100 % (03/10 0400) Last BM Date:  (PTA) General:    White male resting in bed Heart:  tachycardic; no murmurs Lungs: Respirations even and unlabored, Abdomen:  Soft, mild right sided tenderness and nondistended.  Extremities:  Without edema. Neurologic:  Alert and oriented. Unable to assess for asterixis, patient not able to understand or hear instructions t.  Intake/Output from previous day: 03/09 0701 - 03/10 0700 In: 1862 [P.O.:120; I.V.:1742] Out: 1105 [Urine:1105] Intake/Output this shift: No intake/output data recorded.  Lab Results:  Recent Labs  12/03/16 2010 12/04/16 0348 12/05/16 0136  WBC 12.0* 9.6 3.6*  HGB 13.0 12.5* 10.9*  HCT 38.0* 37.9* 33.5*  PLT 157 145* 115*   BMET  Recent Labs  12/04/16 0033 12/04/16 0348 12/05/16 0136  NA 137 136 140  K 3.7 3.5 3.8  CL 107 106 110  CO2 21* 18* 19*  GLUCOSE 107* 105* 108*  BUN 49* 42* 25*  CREATININE 0.74 0.76 0.57*  CALCIUM 7.9* 8.0* 8.2*   LFT  Recent Labs  12/04/16 1324 12/05/16 0136  PROT 5.0* 4.8*  ALBUMIN 2.7* 2.4*  AST 722* 397*  ALT 3,596* 2,587*  ALKPHOS 105 118  BILITOT 1.4* 1.3*  BILIDIR 0.7*  --   IBILI 0.7  --    PT/INR  Recent Labs  12/04/16 1324 12/05/16 0136  LABPROT 19.0* 17.2*  INR 1.57 1.39    Studies/Results: Dg Chest 2 View  Result Date: 12/03/2016 CLINICAL DATA:  Chest pain EXAM: CHEST  2 VIEW COMPARISON:  10/22/2016, 10/13/2016 FINDINGS: The lungs are hyperinflated  and there is bullous emphysematous disease within the upper lobes. There is mild left pleural scarring. There is no acute infiltrate. Stable cardiomediastinal silhouette. No pneumothorax. IMPRESSION: 1. Hyperinflation with bullous emphysematous disease bilaterally. 2. No acute infiltrate Electronically Signed   By: Donavan Foil M.D.   On: 12/03/2016 19:01   Ct Angio Chest Pe W And/or Wo Contrast  Result Date: 12/03/2016 CLINICAL DATA:  Shortness of breath. Chest pain for 1 day, dysuria for 2 days. History of COPD. EXAM: CT ANGIOGRAPHY CHEST WITH CONTRAST TECHNIQUE: Multidetector CT imaging of the chest was performed using the standard protocol during bolus administration of intravenous contrast. Multiplanar CT image reconstructions and MIPs were obtained to evaluate the vascular anatomy. CONTRAST:  100 cc Isovue 370 COMPARISON:  Chest radiograph December 03, 2016 1848 hours and CT chest January 08, 2016 FINDINGS: Mild respiratory motion degraded examination. CARDIOVASCULAR: Adequate contrast opacification of the pulmonary artery's. Main pulmonary artery is not enlarged. No pulmonary arterial filling defects to the level of the subsegmental branches. Respiratory motion results in heterogeneous density RIGHT lower lobe subsegmental pulmonary arteries. Heart size is normal, no right heart strain. No pericardial effusion. Thoracic aorta is normal course and caliber, LEFT vertebral artery arises directly from the op aortic arch, normal variant. Trace calcific atherosclerosis of the aortic arch. MEDIASTINUM/NODES: No lymphadenopathy by CT size criteria. LUNGS/PLEURA: Tracheobronchial tree is patent, no pneumothorax. Mild chronic  bronchial wall thickening. Severe centrilobular emphysema. No pleural effusion or focal consolidation. Hyperventilation. UPPER ABDOMEN: Included view of the abdomen is nonacute. Stable 3.9 cm benign LEFT adrenal adenoma (7 Hounsfield units). MUSCULOSKELETAL: Visualized soft tissues and included  osseous structures are nonacute. Old posterior LEFT rib fractures. Respiratory motion at multiple levels results in spurious appearance of rib fractures. Review of the MIP images confirms the above findings. IMPRESSION: No acute pulmonary embolism. Stable emphysema and chronic bronchitis. Stable large LEFT adrenal benign adenoma. Electronically Signed   By: Elon Alas M.D.   On: 12/03/2016 21:58   US Abdomen Limited Ruq  Result Date: 12/04/2016 CLINICAL DATA:  Initial evaluation for elevated liver enzymes, bilirubin. Question hepatitis. EXAM: US ABDOMEN LIMITED - RIGHT UPPER QUADRANT COMPARISON:  Prior radiograph from 10/24/2016. FINDINGS: Gallbladder: No gallstones or wall thickening visualized. No sonographic Murphy sign noted by sonographer. Common bile duct: Diameter: 4.2 mm Liver: No focal lesion identified. Within normal limits in parenchymal echogenicity. IMPRESSION: Normal right upper quadrant ultrasound. No sonographic evidence for acute abnormality identified. Electronically Signed   By: Jeannine Boga M.D.   On: 12/04/2016 00:14       Assessment / Plan:   66 y/o who presented with profound new transaminitis with ALT in 4000s and elevated INR to 1.6, concerning for acute liver failure at time of admission. Patient is hard of hearing and mental status difficult to assess but has appeared stable. Fortunately he has improved and does not appear to have developed liver failure, he has had an acute liver injury due to acetaminophen toxicity based on labs thus far. Other serologies pending. His INR and labs downtrended with NAC protocol which is very encouraging, hopefully this trend continues. Should he worsen, I don't think he is a good transplant candidate given his multiple medical problems and social situation.  Recommend: - NAC protocol per poison control center, they have recommended stopping it given tylenol level now undetectable and LFTs downtrending - continue to trend INR  and LFTs - advance diet as tolerated - protonix 40mg  po daily for reflux symptoms - await pending serologies  Please call with questions or changes in his status.   Leonard Cellar, MD [facility] Gastroenterology Pager [phone]

## 2016-12-06 ENCOUNTER — Inpatient Hospital Stay (HOSPITAL_COMMUNITY): Payer: Medicare Other

## 2016-12-06 DIAGNOSIS — D649 Anemia, unspecified: Secondary | ICD-10-CM

## 2016-12-06 DIAGNOSIS — R079 Chest pain, unspecified: Secondary | ICD-10-CM

## 2016-12-06 DIAGNOSIS — R946 Abnormal results of thyroid function studies: Secondary | ICD-10-CM

## 2016-12-06 LAB — ECHOCARDIOGRAM COMPLETE
AO mean calculated velocity dopler: 93.5 cm/s
AV pk vel: 127 cm/s
AV vel: 3.32
AVAREAMEANV: 3 cm2
AVAREAMEANVIN: 1.89 cm2/m2
AVAREAVTI: 3.11 cm2
AVAREAVTIIND: 2.09 cm2/m2
AVCELMEANRAT: 0.61
AVG: 4 mmHg
AVPG: 6 mmHg
Ao pk vel: 0.63 m/s
CHL CUP AV PEAK INDEX: 1.96
CHL CUP TV REG PEAK VELOCITY: 280 cm/s
E decel time: 264 msec
E/e' ratio: 6.03
FS: 30 % (ref 28–44)
HEIGHTINCHES: 69 in
IV/PV OW: 1.22
LA ID, A-P, ES: 27 mm
LA diam end sys: 27 mm
LA diam index: 1.7 cm/m2
LA vol A4C: 23.2 ml
LDCA: 4.91 cm2
LV E/e' medial: 6.03
LV E/e'average: 6.03
LV TDI E'MEDIAL: 4.95
LV e' LATERAL: 6.92 cm/s
LVOT SV: 73 mL
LVOT VTI: 14.8 cm
LVOTD: 25 mm
LVOTPV: 80.5 cm/s
LVOTVTI: 0.68 cm
Lateral S' vel: 6.9 cm/s
MV Dec: 264
MVPKAVEL: 56.3 m/s
MVPKEVEL: 41.7 m/s
PW: 9.98 mm — AB (ref 0.6–1.1)
RV TAPSE: 16.3 mm
RV sys press: 39 mmHg
TDI e' lateral: 6.92
TRMAXVEL: 280 cm/s
VTI: 21.9 cm
Valve area index: 2.09
Valve area: 3.32 cm2
WEIGHTICAEL: 1735.46 [oz_av]

## 2016-12-06 LAB — COMPREHENSIVE METABOLIC PANEL
ALT: 1702 U/L — AB (ref 17–63)
AST: 176 U/L — ABNORMAL HIGH (ref 15–41)
Albumin: 2.3 g/dL — ABNORMAL LOW (ref 3.5–5.0)
Alkaline Phosphatase: 130 U/L — ABNORMAL HIGH (ref 38–126)
Anion gap: 4 — ABNORMAL LOW (ref 5–15)
BILIRUBIN TOTAL: 1.4 mg/dL — AB (ref 0.3–1.2)
BUN: 19 mg/dL (ref 6–20)
CO2: 23 mmol/L (ref 22–32)
CREATININE: 0.38 mg/dL — AB (ref 0.61–1.24)
Calcium: 8.3 mg/dL — ABNORMAL LOW (ref 8.9–10.3)
Chloride: 110 mmol/L (ref 101–111)
GFR calc Af Amer: >60 mL/min (ref >60)
Glucose, Bld: 112 mg/dL — ABNORMAL HIGH (ref 65–99)
Potassium: 3.4 mmol/L — ABNORMAL LOW (ref 3.5–5.1)
Sodium: 137 mmol/L (ref 135–145)
Total Protein: 4.5 g/dL — ABNORMAL LOW (ref 6.5–8.1)

## 2016-12-06 LAB — ANTI-SMOOTH MUSCLE ANTIBODY, IGG: F-Actin IgG: 8 Units (ref 0–19)

## 2016-12-06 LAB — CBC
HEMATOCRIT: 29.4 % — AB (ref 39.0–52.0)
Hemoglobin: 9.6 g/dL — ABNORMAL LOW (ref 13.0–17.0)
MCH: 29.4 pg (ref 26.0–34.0)
MCHC: 32.7 g/dL (ref 30.0–36.0)
MCV: 89.9 fL (ref 78.0–100.0)
Platelets: 75 10*3/uL — ABNORMAL LOW (ref 150–400)
RBC: 3.27 MIL/uL — ABNORMAL LOW (ref 4.22–5.81)
RDW: 14.6 % (ref 11.5–15.5)
WBC: 2.6 10*3/uL — AB (ref 4.0–10.5)

## 2016-12-06 LAB — TSH: TSH: 0.097 u[IU]/mL — ABNORMAL LOW (ref 0.350–4.500)

## 2016-12-06 LAB — T4, FREE: Free T4: 1.17 ng/dL — ABNORMAL HIGH (ref 0.61–1.12)

## 2016-12-06 MED ORDER — POTASSIUM CHLORIDE CRYS ER 20 MEQ PO TBCR
40.0000 meq | EXTENDED_RELEASE_TABLET | Freq: Once | ORAL | Status: AC
  Administered 2016-12-06: 40 meq via ORAL
  Filled 2016-12-06: qty 2

## 2016-12-06 MED ORDER — METOPROLOL TARTRATE 12.5 MG HALF TABLET
12.5000 mg | ORAL_TABLET | Freq: Two times a day (BID) | ORAL | Status: DC
  Administered 2016-12-06 – 2016-12-10 (×10): 12.5 mg via ORAL
  Filled 2016-12-06 (×10): qty 1

## 2016-12-06 NOTE — Consult Note (Signed)
Reason for Consult: To determine Capacity.  Attempt to interview patient for capacity determination unsuccessful. Patient refused to answer questions, claiming that he is tired and says he is cooperative with treatment.  I spoke with his assigned RN who confirm that patient is accepting treatment but needs capacity determination for placement when he is ready for discharge. Plan: Re-consult psych service for capacity when patient is ready to be interviewed.  Corena Pilgrim, MD

## 2016-12-06 NOTE — Progress Notes (Signed)
Patient arrived on unit via bed from [address].  No family at bedside. Telemetry placed per MD order and CMT notified.

## 2016-12-06 NOTE — Progress Notes (Addendum)
      Progress Note   Subjective  Patient is eating okay. Says he has "indigestion". No bowel movements. LFTs continue to improve.    Objective   Vital signs in last 24 hours: Temp:  [97.5 F (36.4 C)-98.7 F (37.1 C)] 97.8 F (36.6 C) (03/11 0800) Pulse Rate:  [94-130] 94 (03/11 0600) Resp:  [14-23] 17 (03/11 0600) BP: (118-146)/(86-102) 127/98 (03/11 0600) SpO2:  [92 %-100 %] 100 % (03/11 0813) Last BM Date:  (PTA) General:    white male in NAD, very hard of hearing Heart:  Regular rate and rhythm; Lungs: Respirations even and unlabored,  Abdomen:  Soft, nontender and nondistended.  Extremities:  Without edema. Neurologic:  Alert and oriented, difficulty hearing Psych:  Cooperative. Normal mood and affect.  Intake/Output from previous day: 03/10 0701 - 03/11 0700 In: 1675.8 [P.O.:480; I.V.:1195.8] Out: 1125 [Urine:1125] Intake/Output this shift: No intake/output data recorded.  Lab Results:  Recent Labs  12/04/16 0348 12/05/16 0136 12/06/16 0436  WBC 9.6 3.6* 2.6*  HGB 12.5* 10.9* 9.6*  HCT 37.9* 33.5* 29.4*  PLT 145* 115* 75*   BMET  Recent Labs  12/04/16 0348 12/05/16 0136 12/06/16 0436  NA 136 140 137  K 3.5 3.8 3.4*  CL 106 110 110  CO2 18* 19* 23  GLUCOSE 105* 108* 112*  BUN 42* 25* 19  CREATININE 0.76 0.57* 0.38*  CALCIUM 8.0* 8.2* 8.3*   LFT  Recent Labs  12/04/16 1324  12/06/16 0436  PROT 5.0*  < > 4.5*  ALBUMIN 2.7*  < > 2.3*  AST 722*  < > 176*  ALT 3,596*  < > 1,702*  ALKPHOS 105  < > 130*  BILITOT 1.4*  < > 1.4*  BILIDIR 0.7*  --   --   IBILI 0.7  --   --   < > = values in this interval not displayed. PT/INR  Recent Labs  12/04/16 1324 12/05/16 0136  LABPROT 19.0* 17.2*  INR 1.57 1.39    Studies/Results: No results found.     Assessment / Plan:   66 y/o male who presented with profound new transaminitis with ALT in 4000s and elevated INR to 1.6, concerning for acute liver failure at time of admission. Mental  status appeared stable throughout. Fortunately he has improved with NAC, INR and ALT downtrending. He has had an acute liver injury due to acetaminophen toxicity based on labs thus far. Serologic workup negative to date with a few pending.  Of note, pancytopenia has developed. No bowel movements or evidence of GI bleeding. Also noted is social / living situation which is being addressed by Education officer, museum.  Recommend: - daily LFTs and INR - protonix 40mg  po daily for reflux symptoms - await pending serologies - pancytopenia workup per primary team - no further acetaminophen as outpatient, hopefully social situation can be improved to prevent occurences in the future  Overall, he continues to recover from acetominophen toxicity, and expect LAEs to continue to downtrend at this point given trend. I think he can transfer to regular ward today.  GI will sign off for now, please re-consult with additional questions concerns.    Harwick Cellar, MD [facility] Gastroenterology Pager [phone]

## 2016-12-06 NOTE — Progress Notes (Signed)
[facility] PROGRESS NOTE  [name] OXB:[id number] DOB: [birth year] DOA: 12/03/2016  PCP: [name], MD  Brief History/Interval Summary: 66 y.o. gentleman with a history of nonobstructing CAD, COPD (he is not on home oxygen), active tobacco use with dependence, prior CHF (LV systolic function normal on last echo in 2016), and hard of hearing who called EMS for evaluation of chest pain and shortness of breath. The patient is a poor historian. Patient was experiencing migraine headaches and took unclear amount of acetaminophen, NSAIDs and aspirin. No clear reason was found for his chest pain or shortness of breath. However, he was found to have elevated liver function tests. He also had elevated Tylenol level. He was admitted for further management of Tylenol toxicity.   Reason for Visit: Acetaminophen toxicity with acute liver failure  Consultants: Gastroenterology. Critical care medicine.  Procedures: None  Antibiotics: He was started on vancomycin and cefepime which was discontinued on 3/9.  Subjective/Interval History: Patient complains of feeling congested in his chest. Denies any chest pain per se. Denies any nausea or vomiting. No abdominal pain. He does have hearing impairment.    Objective:  Vital Signs  Vitals:   12/06/16 0356 12/06/16 0400 12/06/16 0500 12/06/16 0600  BP: (!) 138/97 (!) 129/99 (!) 127/97 (!) 127/98  Pulse: (!) 102 (!) 103 96 94  Resp: 14 19 15 17   Temp: 97.5 F (36.4 C)     TempSrc: Oral     SpO2: 100% 100% 100% 100%  Weight:      Height:        Intake/Output Summary (Last 24 hours) at 12/06/16 0747 Last data filed at 12/06/16 0643  Gross per 24 hour  Intake          1675.83 ml  Output             1125 ml  Net           550.83 ml   Filed Weights   12/03/16 1821 12/03/16 2010 12/04/16 0318  Weight: 49.9 kg (110 lb) 52.2 kg (115 lb) 48.8 kg (107 lb 9.4 oz)    General appearance: alert, distracted and no distress Resp: clear  to auscultation bilaterally. Coarse breath sounds are noted. Cardio: regular rate and rhythm, S1, S2 normal, no murmur, click, rub or gallop GI: soft, non-tender; bowel sounds normal; no masses,  no organomegaly Extremities: extremities normal, atraumatic, no cyanosis or edema Neurologic: Awake and alert. Somewhat distracted. No obvious facial asymmetry. Moving all his extremities.  Lab Results:  Data Reviewed: I have personally reviewed following labs and imaging studies  CBC:  Recent Labs Lab 12/03/16 2010 12/04/16 0348 12/05/16 0136 12/06/16 0436  WBC 12.0* 9.6 3.6* 2.6*  NEUTROABS 11.2*  --   --   --   HGB 13.0 12.5* 10.9* 9.6*  HCT 38.0* 37.9* 33.5* 29.4*  MCV 86.8 88.8 89.1 89.9  PLT 157 145* 115* 75*    Basic Metabolic Panel:  Recent Labs Lab 12/03/16 2010 12/04/16 0033 12/04/16 0348 12/05/16 0136 12/06/16 0436  NA 135 137 136 140 137  K 4.0 3.7 3.5 3.8 3.4*  CL 102 107 106 110 110  CO2 19* 21* 18* 19* 23  GLUCOSE 100* 107* 105* 108* 112*  BUN 60* 49* 42* 25* 19  CREATININE 0.93 0.74 0.76 0.57* 0.38*  CALCIUM 8.5* 7.9* 8.0* 8.2* 8.3*    GFR: Estimated Creatinine Clearance: 62.7 mL/min (by C-G formula based on SCr of 0.38 mg/dL (L)).  Liver Function Tests:  Recent Labs Lab 12/04/16 0033 12/04/16 0348 12/04/16 1324 12/05/16 0136 12/06/16 0436  AST 1,467* 1,213* 722* 397* 176*  ALT 4,248* 4,254* 3,596* 2,587* 1,702*  ALKPHOS 125 116 105 118 130*  BILITOT 1.7* 1.9* 1.4* 1.3* 1.4*  PROT 5.7* 5.4* 5.0* 4.8* 4.5*  ALBUMIN 3.1* 2.8* 2.7* 2.4* 2.3*     Recent Labs Lab 12/03/16 2333  LIPASE 23    Recent Labs Lab 12/04/16 0108  AMMONIA 27    Coagulation Profile:  Recent Labs Lab 12/03/16 1950 12/04/16 0348 12/04/16 1324 12/05/16 0136  INR 1.53 1.66 1.57 1.39    Cardiac Enzymes:  Recent Labs Lab 12/04/16 0807 12/04/16 1324 12/04/16 2015  TROPONINI 0.06* 0.10* 0.05*    Recent Results (from the past 240 hour(s))  Blood  Culture (routine x 2)     Status: None (Preliminary result)   Collection Time: 12/03/16  7:25 PM  Result Value Ref Range Status   Specimen Description BLOOD LEFT HAND  Final   Special Requests BOTTLES DRAWN AEROBIC AND ANAEROBIC 5CC EACH  Final   Culture   Final    NO GROWTH 2 DAYS Performed at [facility] [address] [place], [place] [address]    Report Status PENDING  Incomplete  Blood Culture (routine x 2)     Status: None (Preliminary result)   Collection Time: 12/03/16  7:50 PM  Result Value Ref Range Status   Specimen Description BLOOD BLOOD RIGHT FOREARM  Final   Special Requests BOTTLES DRAWN AEROBIC AND ANAEROBIC 5CC EACH  Final   Culture   Final    NO GROWTH 2 DAYS Performed at [facility], [place] [address], [place],  [address]    Report Status PENDING  Incomplete  MRSA PCR Screening     Status: None   Collection Time: 12/04/16  3:27 AM  Result Value Ref Range Status   MRSA by PCR NEGATIVE NEGATIVE Final    Comment:        The GeneXpert MRSA Assay (FDA approved for NASAL specimens only), is one component of a comprehensive MRSA colonization surveillance program. It is not intended to diagnose MRSA infection nor to guide or monitor treatment for MRSA infections.       Radiology Studies: No results found.   Medications:  Scheduled: . mouth rinse  15 mL Mouth Rinse BID  . mometasone-formoterol  2 puff Inhalation BID  . nicotine  14 mg Transdermal Daily  . pantoprazole  40 mg Oral Daily  . sodium chloride flush  3 mL Intravenous Q12H  . tiotropium  18 mcg Inhalation Daily   Continuous: . sodium chloride 50 mL/hr at 12/06/16 0342   PXT:GGYIRSWNI, ipratropium, levalbuterol, ondansetron **OR** ondansetron (ZOFRAN) IV  Assessment/Plan:  Principal Problem:   Acetaminophen toxicity Active Problems:   COPD (chronic obstructive pulmonary disease) (HCC)   Tobacco abuse   Hard of hearing   CAD (nonobstructive per cath 2015)   HTN  (hypertension)   ALF (acute liver failure)   Acetaminophen overdose of undetermined intent   Elevated troponin    Acetaminophen toxicity , acute liver injury This appears to be an unintentional overdose while he was treating his migraine headache earlier this week. Acetaminophen levels were elevated. Patient was started on intravenous N-acetylcysteine. Poison control was contacted. Patient's acetaminophen levels have become undetectable. LFTs are trending down. INR has improved. N-acetylcysteine was discontinued after approximately 24 hours per poison control. Ammonia level was normal. Right upper quadrant ultrasound did not show  any acute findings. Doppler studies ordered by gastroenterology is pending. Hepatitis panel is negative. HIV is nonreactive. Appreciate critical care medicine input. Gastroenterology continues to follow.   Nonanion gap metabolic acidosis Possibly due to the above. Much improved. Continue IV fluids.   Dehydration S/P aggressive hydration in the ED. Hydration status has improved. Cut back further on IV fluids.  Concern for sepsis with lactic acidosis in the ED Lactate became normal. Patient was empirically started on broad-spectrum antibiotics. Patient is afebrile. Has a normal WBC. No foci of infection is noted. Blood cultures are negative so far. Antibiotics were discontinued. He remains afebrile. WBC remains normal.  Elevated troponin of unclear significance, history of nonobstructive CAD Troponins are only minimally elevated without any significant rise. EKG was of poor quality. Repeat EKG did not show ischemic changes. Patient denies any chest pain currently. Echocardiogram is still pending. Thyroid function test results reviewed. Stable. He was noted to be on albuterol nebulizers, which could have beeen the reason for his tachycardia. Switched over to [employer]. Heart rate has shown some improvement. Will initiate cardioselective beta blocker such as metoprolol at  low dose.  Abnormal TFT's TSH is 0.09. Free T4 mildly elevated at 1.17. Patient does not have any other signs or symptoms of hyperthyroidism except for sinus tachycardia. He'll be started on metoprolol. Would recommend that these thyroid function test repeated in the outpatient setting. Will not initiate definitive treatment considering his other acute problems.  Thrombocytopenia Platelet counts noted to be low this morning. No evidence of bleeding. He is not on any heparin products. Continue to monitor.  History of COPD Appears compensated. Seems to be stable. Nebulizer treatments as needed.  Active tobacco use Patient has no plans to stop smoking. Nicotine patch.  Normocytic Anemia No evidence for bleeding. Check anemia panel. Drop in hemoglobin, is probably due to dilution.  Social situation Patient apparently lives in a motel. When he was picked up by EMS he was found to be living in squalid conditions. There was feces and urine around the room. Social worker has been consulted. Patient will benefit from going to skilled nursing facility for short-term rehabilitation to begin with. Looks like his son wants the same. Patient apparently has refused placement to skilled nursing facility in the past.We will get psychiatry to see him to determine capacity.  DVT Prophylaxis: SCD's   Code Status: Full code  Family Communication: Discussed with Son.  Disposition Plan: Management as outlined above. Stable for transfer to telemetry.    LOS: 2 days   [facility] Pager [phone] 12/06/2016, 7:47 AM  If 7PM-7AM, please contact night-coverage at [email or web address], password [facility]

## 2016-12-07 DIAGNOSIS — D696 Thrombocytopenia, unspecified: Secondary | ICD-10-CM

## 2016-12-07 DIAGNOSIS — Z79899 Other long term (current) drug therapy: Secondary | ICD-10-CM

## 2016-12-07 DIAGNOSIS — F17210 Nicotine dependence, cigarettes, uncomplicated: Secondary | ICD-10-CM

## 2016-12-07 LAB — COMPREHENSIVE METABOLIC PANEL
ALBUMIN: 2.4 g/dL — AB (ref 3.5–5.0)
ALK PHOS: 113 U/L (ref 38–126)
ALT: 1111 U/L — AB (ref 17–63)
ANION GAP: 8 (ref 5–15)
AST: 98 U/L — ABNORMAL HIGH (ref 15–41)
BUN: 15 mg/dL (ref 6–20)
CALCIUM: 8.2 mg/dL — AB (ref 8.9–10.3)
CHLORIDE: 105 mmol/L (ref 101–111)
CO2: 24 mmol/L (ref 22–32)
Creatinine, Ser: 0.4 mg/dL — ABNORMAL LOW (ref 0.61–1.24)
GFR calc Af Amer: >60 mL/min (ref >60)
GFR calc non Af Amer: >60 mL/min (ref >60)
GLUCOSE: 122 mg/dL — AB (ref 65–99)
Potassium: 3.3 mmol/L — ABNORMAL LOW (ref 3.5–5.1)
SODIUM: 137 mmol/L (ref 135–145)
Total Bilirubin: 0.7 mg/dL (ref 0.3–1.2)
Total Protein: 4.8 g/dL — ABNORMAL LOW (ref 6.5–8.1)

## 2016-12-07 LAB — CBC
HCT: 28.7 % — ABNORMAL LOW (ref 39.0–52.0)
HEMOGLOBIN: 9.4 g/dL — AB (ref 13.0–17.0)
MCH: 29 pg (ref 26.0–34.0)
MCHC: 32.8 g/dL (ref 30.0–36.0)
MCV: 88.6 fL (ref 78.0–100.0)
PLATELETS: 59 10*3/uL — AB (ref 150–400)
RBC: 3.24 MIL/uL — AB (ref 4.22–5.81)
RDW: 14.2 % (ref 11.5–15.5)
WBC: 5.4 10*3/uL (ref 4.0–10.5)

## 2016-12-07 LAB — FOLATE: Folate: 11.2 ng/mL (ref >5.9)

## 2016-12-07 LAB — RETICULOCYTES
RBC.: 3.24 MIL/uL — ABNORMAL LOW (ref 4.22–5.81)
Retic Ct Pct: <0.4 % — ABNORMAL LOW (ref 0.4–3.1)

## 2016-12-07 LAB — ANTINUCLEAR ANTIBODIES, IFA: ANA Ab, IFA: NEGATIVE

## 2016-12-07 LAB — IRON AND TIBC
IRON: 58 ug/dL (ref 45–182)
SATURATION RATIOS: 27 % (ref 17.9–39.5)
TIBC: 217 ug/dL — ABNORMAL LOW (ref 250–450)
UIBC: 159 ug/dL

## 2016-12-07 LAB — FERRITIN: FERRITIN: 497 ng/mL — AB (ref 24–336)

## 2016-12-07 LAB — VITAMIN B12: Vitamin B-12: 1424 pg/mL — ABNORMAL HIGH (ref 180–914)

## 2016-12-07 NOTE — Progress Notes (Signed)
Physical Therapy Treatment Patient Details Name: Anthony Bolton MRN: [id number] DOB: 03-28-1951 Today's Date: 12/07/2016    History of Present Illness Pt is a 66 y/o male admitted secondary to chest pain and SOB, found to have acetaminophen toxicity. PMH including but not limited to COPD and CHF.    PT Comments    Pt agitated that he was asked to mobilize, but participated minimally after some encouragement and grumbling.  Bed mobility at mod I, and otherwise needed at least min assist over a short distance with RW.   Follow Up Recommendations  SNF     Equipment Recommendations  None recommended by PT    Recommendations for Other Services       Precautions / Restrictions Precautions Precautions: Fall    Mobility  Bed Mobility Overal bed mobility: Modified Independent                Transfers Overall transfer level: Needs assistance Equipment used: Rolling walker (2 wheeled) Transfers: Sit to/from Stand Sit to Stand: Min assist         General transfer comment: for stability  Ambulation/Gait Ambulation/Gait assistance: Min assist Ambulation Distance (Feet): 40 Feet Assistive device: Rolling walker (2 wheeled) Gait Pattern/deviations: Step-through pattern Gait velocity: slower Gait velocity interpretation: Below normal speed for age/gender General Gait Details: generally steady with RW with cues for better use of RW during turns, but not sure if he used any assistive device PTA.  Pt could not be encouraged to ambulate further than 40 feet.   Stairs            Wheelchair Mobility    Modified Rankin (Stroke Patients Only)       Balance Overall balance assessment: Needs assistance   Sitting balance-Leahy Scale: Fair       Standing balance-Leahy Scale: Poor                      Cognition Arousal/Alertness: Awake/alert   Overall Cognitive Status: Difficult to assess                      Exercises      General  Comments        Pertinent Vitals/Pain Pain Assessment: Faces Faces Pain Scale: Hurts little more Pain Location: vague location Pain Descriptors / Indicators: Grimacing Pain Intervention(s): Monitored during session;Repositioned    Home Living                      Prior Function            PT Goals (current goals can now be found in the care plan section) Acute Rehab PT Goals PT Goal Formulation: With patient Time For Goal Achievement: 12/19/16 Potential to Achieve Goals: Fair Progress towards PT goals: Progressing toward goals    Frequency    Min 3X/week      PT Plan Current plan remains appropriate    Co-evaluation             End of Session   Activity Tolerance: Treatment limited secondary to agitation;Patient limited by fatigue Patient left: in bed;with call bell/phone within reach;with bed alarm set Nurse Communication: Mobility status PT Visit Diagnosis: Other abnormalities of gait and mobility (R26.89);Unsteadiness on feet (R26.81)     Time: 1420-1440 PT Time Calculation (min) (ACUTE ONLY): 20 min  Charges:  $Gait Training: 8-22 mins  G CodesTessie Fass Zoriah Pulice 12/07/2016, 3:52 PM 12/07/2016  Donnella Sham, [place] [phone]  (pager)

## 2016-12-07 NOTE — Consult Note (Signed)
[facility] Consult   Reason for Consult:  Capacity evaluation, AMS, unintentional drug overdose Referring Physician:  Dr. Maryland Pink Patient Identification: Anthony Bolton MRN:  [id number] Principal Diagnosis: Acetaminophen toxicity Diagnosis:   Patient Active Problem List   Diagnosis Date Noted  . Abnormal thyroid function test [R94.6]   . Normocytic anemia [D64.9]   . Elevated troponin [R74.8]   . Acetaminophen toxicity [T39.1X1A] 12/04/2016  . ALF (acute liver failure) [K72.00] 12/04/2016  . Acetaminophen overdose of undetermined intent [T39.1X4A]   . 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 Aortic atherosclerosis ([place]) [I70.0] 10/22/2016  . Protein-calorie malnutrition, severe [E43] 10/07/2016  . Acute respiratory failure with hypoxia ([place]) [J96.01] 10/07/2016  . Other emphysema ([place]) [J43.8] 10/07/2016  . Transaminitis [R74.0] 12/03/2015  . Dehydration [E86.0]   . High anion gap metabolic acidosis [W09.8] 12/02/2015  . Malnutrition due to starvation ([place]) [E46] 12/02/2015  . Leukocytosis [D72.829] 12/02/2015  . Abdominal pain [R10.9] 10/12/2015  . Nausea & vomiting [R11.2] 10/12/2015  . Chest pain [R07.9] 10/12/2015  . AKI (acute kidney injury) ([place]) [N17.9] 10/12/2015  . SBO (small bowel obstruction) [K56.609] 10/12/2015  . Acute respiratory failure ([place]) [J96.00] 08/11/2015  . Hard of hearing [H91.90] 08/11/2015  . CAD (nonobstructive per cath 2015) [I25.10] 08/11/2015  . HTN (hypertension) [I10] 08/11/2015  . HLD (hyperlipidemia) [E78.5]   . Malnutrition of moderate degree ([place]) [E44.0] 03/02/2015  . COPD exacerbation ([place]) [J44.1] 03/01/2015  . Chronic systolic congestive heart failure, NYHA class 3 ([place]) [I50.22] 03/01/2015  . Hyperthyroidism [E05.90] 11/06/2013  . Mass of adrenal gland ([place]) [E27.9] 11/04/2013  . Tobacco abuse [Z72.0] 11/04/2013  . Hyponatremia [E87.1] 10/31/2013  . COPD (chronic obstructive pulmonary disease) ([place]) [J44.9]  10/31/2013    Total Time spent with patient: 45 minutes  Subjective:   Anthony Bolton is a 66 y.o. male patient admitted with unintentional acetaminophen overdose.  HPI:  Anthony Bolton is a 66 y.o.  male with a history of nonobstructing CAD, COPD (he is not on home oxygen), active tobacco use with dependence, prior CHF (LV systolic function normal on last echo in 2016), and hard of hearing who called EMS for evaluation of chest pain and shortness of breath. He thad a migraine headache on Tuesday and Wednesday and he took unclear amount of acetaminophen, NSAIDs, and aspirin.   Psychiatric consultation has requested for capacity evaluation as patient cannot care for himself and required out-of-home placement. Reportedly patient has been living in a motel, and has poor surroundings when emergency medical services brought him from there to the hospital. Patient is a poor historian reports he does not know or does not remember when asked questions. Patient could not even answer simple questions like his first name, last name, not oriented to year, day, date, season  and patient does not have any awareness of his medical problems. She has no capacity to understand his medical problems, emotional problems, memory problems and seek appropriate outpatient medication management. Patient has no capacity to leave himself alone any longer after this evaluation.   Past Psychiatric History: None  Risk to Self: Is patient at risk for suicide?: No Risk to Others:   Prior Inpatient Therapy:   Prior Outpatient Therapy:    Past Medical History:  Past Medical History:  Diagnosis Date  . Adrenal adenoma, left 11/2015   adenoma on 2015 MRI, stable per CT 11/2015.   Marland Kitchen Anemia 09/2016   normocytic.  Hgb 10.2 on 10/25/16  . CHF (congestive heart  failure) ([place]) 10/2013   per [facility]: LVEF 20-25% 10/2013, 50-50% 07/2015  . COPD (chronic obstructive pulmonary disease) ([place])   . HLD (hyperlipidemia)   . HOH (hard of  hearing)   . Migraines   . Prostate hypertrophy 09/2016   per CT 10/22/16.    . Protein-calorie malnutrition, severe ([place]) 09/2016   adult failure to thrive dx during 09/2016 admission  . SBO (small bowel obstruction) 03/2015, 09/2016  . Thrombocytopenia ([place]) 09/2016   platelets 126K 10/25/16.      Past Surgical History:  Procedure Laterality Date  . APPENDECTOMY    . cardiac stents     No stent history  . LEFT HEART CATHETERIZATION WITH CORONARY ANGIOGRAM N/A 11/07/2013   Procedure: LEFT HEART CATHETERIZATION WITH CORONARY ANGIOGRAM;  Surgeon: Clent Demark, MD;  Location: [facility] CATH LAB;  Service: Cardiovascular;  Laterality: N/A;   Family History:  Family History  Problem Relation Age of Onset  . Stroke Mother    Family Psychiatric  History: Noncontributory Social History:  History  Alcohol Use No     History  Drug Use No    Social History   Social History  . Marital status: Single    Spouse name: N/A  . Number of children: N/A  . Years of education: N/A   Social History Main Topics  . Smoking status: Current Every Day Smoker    Packs/day: 0.50    Types: Cigarettes  . Smokeless tobacco: Never Used  . Alcohol use No  . Drug use: No  . Sexual activity: No   Other Topics Concern  . None   Social History Narrative  . None   Additional Social History:    Allergies:  No Known Allergies  Labs:  Results for orders placed or performed during the hospital encounter of 12/03/16 (from the past 48 hour(s))  TSH     Status: Abnormal   Collection Time: 12/06/16  4:36 AM  Result Value Ref Range   TSH 0.097 (L) 0.350 - 4.500 uIU/mL    Comment: Performed by a 3rd Generation assay with a functional sensitivity of <=0.01 uIU/mL.  T4, free     Status: Abnormal   Collection Time: 12/06/16  4:36 AM  Result Value Ref Range   Free T4 1.17 (H) 0.61 - 1.12 ng/dL    Comment: (NOTE) Biotin ingestion may interfere with free T4 tests. If the results are inconsistent with the TSH  level, previous test results, or the clinical presentation, then consider biotin interference. If needed, order repeat testing after stopping biotin.   CBC     Status: Abnormal   Collection Time: 12/06/16  4:36 AM  Result Value Ref Range   WBC 2.6 (L) 4.0 - 10.5 K/uL   RBC 3.27 (L) 4.22 - 5.81 MIL/uL   Hemoglobin 9.6 (L) 13.0 - 17.0 g/dL   HCT 29.4 (L) 39.0 - 52.0 %   MCV 89.9 78.0 - 100.0 fL   MCH 29.4 26.0 - 34.0 pg   MCHC 32.7 30.0 - 36.0 g/dL   RDW 14.6 11.5 - 15.5 %   Platelets 75 (L) 150 - 400 K/uL    Comment: CONSISTENT WITH PREVIOUS RESULT  Comprehensive metabolic panel     Status: Abnormal   Collection Time: 12/06/16  4:36 AM  Result Value Ref Range   Sodium 137 135 - 145 mmol/L   Potassium 3.4 (L) 3.5 - 5.1 mmol/L   Chloride 110 101 - 111 mmol/L   CO2 23 22 - 32  mmol/L   Glucose, Bld 112 (H) 65 - 99 mg/dL   BUN 19 6 - 20 mg/dL   Creatinine, Ser 0.38 (L) 0.61 - 1.24 mg/dL   Calcium 8.3 (L) 8.9 - 10.3 mg/dL   Total Protein 4.5 (L) 6.5 - 8.1 g/dL   Albumin 2.3 (L) 3.5 - 5.0 g/dL   AST 176 (H) 15 - 41 U/L   ALT 1,702 (H) 17 - 63 U/L   Alkaline Phosphatase 130 (H) 38 - 126 U/L   Total Bilirubin 1.4 (H) 0.3 - 1.2 mg/dL   GFR calc non Af Amer >60 >60 mL/min   GFR calc Af Amer >60 >60 mL/min    Comment: (NOTE) The eGFR has been calculated using the CKD EPI equation. This calculation has not been validated in all clinical situations. eGFR's persistently <60 mL/min signify possible Chronic Kidney Disease.    Anion gap 4 (L) 5 - 15  CBC     Status: Abnormal   Collection Time: 12/07/16  6:39 AM  Result Value Ref Range   WBC 5.4 4.0 - 10.5 K/uL   RBC 3.24 (L) 4.22 - 5.81 MIL/uL   Hemoglobin 9.4 (L) 13.0 - 17.0 g/dL   HCT 28.7 (L) 39.0 - 52.0 %   MCV 88.6 78.0 - 100.0 fL   MCH 29.0 26.0 - 34.0 pg   MCHC 32.8 30.0 - 36.0 g/dL   RDW 14.2 11.5 - 15.5 %   Platelets 59 (L) 150 - 400 K/uL    Comment: SPECIMEN CHECKED FOR CLOTS PLATELET COUNT CONFIRMED BY SMEAR    Comprehensive metabolic panel     Status: Abnormal   Collection Time: 12/07/16  6:39 AM  Result Value Ref Range   Sodium 137 135 - 145 mmol/L   Potassium 3.3 (L) 3.5 - 5.1 mmol/L   Chloride 105 101 - 111 mmol/L   CO2 24 22 - 32 mmol/L   Glucose, Bld 122 (H) 65 - 99 mg/dL   BUN 15 6 - 20 mg/dL   Creatinine, Ser 0.40 (L) 0.61 - 1.24 mg/dL   Calcium 8.2 (L) 8.9 - 10.3 mg/dL   Total Protein 4.8 (L) 6.5 - 8.1 g/dL   Albumin 2.4 (L) 3.5 - 5.0 g/dL   AST 98 (H) 15 - 41 U/L   ALT 1,111 (H) 17 - 63 U/L   Alkaline Phosphatase 113 38 - 126 U/L   Total Bilirubin 0.7 0.3 - 1.2 mg/dL   GFR calc non Af Amer >60 >60 mL/min   GFR calc Af Amer >60 >60 mL/min    Comment: (NOTE) The eGFR has been calculated using the CKD EPI equation. This calculation has not been validated in all clinical situations. eGFR's persistently <60 mL/min signify possible Chronic Kidney Disease.    Anion gap 8 5 - 15  Vitamin B12     Status: Abnormal   Collection Time: 12/07/16  6:39 AM  Result Value Ref Range   Vitamin B-12 1,424 (H) 180 - 914 pg/mL    Comment: (NOTE) This assay is not validated for testing neonatal or myeloproliferative syndrome specimens for Vitamin B12 levels.   Folate     Status: None   Collection Time: 12/07/16  6:39 AM  Result Value Ref Range   Folate 11.2 >5.9 ng/mL  Iron and TIBC     Status: Abnormal   Collection Time: 12/07/16  6:39 AM  Result Value Ref Range   Iron 58 45 - 182 ug/dL   TIBC 217 (L) 250 - 450  ug/dL   Saturation Ratios 27 17.9 - 39.5 %   UIBC 159 ug/dL  Ferritin     Status: Abnormal   Collection Time: 12/07/16  6:39 AM  Result Value Ref Range   Ferritin 497 (H) 24 - 336 ng/mL  Reticulocytes     Status: Abnormal   Collection Time: 12/07/16  6:39 AM  Result Value Ref Range   Retic Ct Pct <0.4 (L) 0.4 - 3.1 %   RBC. 3.24 (L) 4.22 - 5.81 MIL/uL   Retic Count, Manual NOT CALCULATED 19.0 - 186.0 K/uL    Current Facility-Administered Medications  Medication Dose  Route Frequency Provider Last Rate Last Dose  . ibuprofen (ADVIL,MOTRIN) tablet 400 mg  400 mg Oral TID PRN Bonnielee Haff, MD   400 mg at 12/06/16 2240  . ipratropium (ATROVENT) nebulizer solution 0.5 mg  0.5 mg Nebulization Q4H PRN Bonnielee Haff, MD   0.5 mg at 12/05/16 2329  . levalbuterol (XOPENEX) nebulizer solution 0.63 mg  0.63 mg Nebulization Q4H PRN Bonnielee Haff, MD   0.63 mg at 12/05/16 2329  . MEDLINE mouth rinse  15 mL Mouth Rinse BID Rise Patience, MD   15 mL at 12/07/16 1000  . metoprolol tartrate (LOPRESSOR) tablet 12.5 mg  12.5 mg Oral BID Bonnielee Haff, MD   12.5 mg at 12/07/16 1052  . mometasone-formoterol (DULERA) 100-5 MCG/ACT inhaler 2 puff  2 puff Inhalation BID Bonnielee Haff, MD   2 puff at 12/07/16 0900  . nicotine (NICODERM CQ - dosed in mg/24 hours) patch 14 mg  14 mg Transdermal Daily Bonnielee Haff, MD   14 mg at 12/07/16 1051  . ondansetron (ZOFRAN) tablet 4 mg  4 mg Oral Q6H PRN Lily Kocher, MD   4 mg at 12/06/16 1043   Or  . ondansetron (ZOFRAN) injection 4 mg  4 mg Intravenous Q6H PRN Lily Kocher, MD      . pantoprazole (PROTONIX) EC tablet 40 mg  40 mg Oral Daily Manus Gunning, MD   40 mg at 12/07/16 1052  . sodium chloride flush (NS) 0.9 % injection 3 mL  3 mL Intravenous Q12H Lily Kocher, MD   3 mL at 12/07/16 1000  . tiotropium (SPIRIVA) inhalation capsule 18 mcg  18 mcg Inhalation Daily Bonnielee Haff, MD   18 mcg at 12/07/16 0900    Musculoskeletal: Strength & Muscle Tone: decreased Gait & Station: unable to stand Patient leans: N/A  Psychiatric Specialty Exam: Physical Exam as per history and physical    ROS unable to perform as patient is a poor historian unable to assess simple questions .  Blood pressure 129/83, pulse 78, temperature 98 F (36.7 C), temperature source Oral, resp. rate 18, height _0  (1.753 m), weight 50.4 kg (111 lb 1.8 oz), SpO2 98 %.Body mass index is 16.41 kg/m.  General Appearance: Bizarre and  Disheveled  Eye Contact:  Fair  Speech:  Slow and Slurred  Volume:  Decreased  Mood:  Anxious  Affect:  Constricted and Inappropriate  Thought Process:  Irrelevant  Orientation:  Negative  Thought Content:  Rumination  Suicidal Thoughts:  No  Homicidal Thoughts:  No  Memory:  Immediate;   Fair Recent;   Poor Remote;   Poor  Judgement:  Impaired  Insight:  Lacking  Psychomotor Activity:  Psychomotor Retardation  Concentration:  Concentration: Poor and Attention Span: Poor  Recall:  Poor  Fund of Knowledge:  Poor  Language:  Fair  Akathisia:  Negative  Handed:  Right  AIMS (if indicated):     Assets:  Others:  TBD  ADL's:  Impaired  Cognition:  Impaired,  Severe  Sleep:        Treatment Plan Summary: 65 years old male with multiple medical problems including chronic obstructive pulmonary disease, hard of hearing, unintentional a stimulant toxicity with acute liver failure and unable to care for himself in a motel.  Based on my evaluation patient does not meet criteria for capacity to make his own medical decisions OR living arrangements.  Please contact patient's son for obtaining medical care power of attorney, and case manager/social service has been to assess the family members regarding appropriate documentation needs.  Patient will benefit from skilled nursing facility placement once medically stable.   Disposition: No evidence of imminent risk to self or others at present.   Supportive therapy provided about ongoing stressors.  Ambrose Finland, MD 12/07/2016 3:01 PM

## 2016-12-07 NOTE — Progress Notes (Signed)
[facility] PROGRESS NOTE  Anthony Bolton ZHG:[id number] DOB: 06/14/1951 DOA: 12/03/2016  PCP: [name], MD  Brief History/Interval Summary: 66 y.o. gentleman with a history of nonobstructing CAD, COPD (he is not on home oxygen), active tobacco use with dependence, prior CHF (LV systolic function normal on last echo in 2016), and hard of hearing who called EMS for evaluation of chest pain and shortness of breath. The patient is a poor historian. Patient was experiencing migraine headaches and took unclear amount of acetaminophen, NSAIDs and aspirin. No clear reason was found for his chest pain or shortness of breath. However, he was found to have elevated liver function tests. He also had elevated Tylenol level. He was admitted for further management of Tylenol toxicity.   Reason for Visit: Acetaminophen toxicity with acute liver failure  Consultants: Gastroenterology. Critical care medicine.  Procedures:   Transthoracic echocardiogram Study Conclusions  - Procedure narrative: Transthoracic echocardiography. Image   quality was suboptimal. - Left ventricle: The cavity size was normal. Systolic function was   mildly reduced. The estimated ejection fraction was 45%. Diffuse   hypokinesis. Doppler parameters are consistent with abnormal left   ventricular relaxation (grade 1 diastolic dysfunction). MIld   septal thickening. - Aortic valve: There was trivial regurgitation. - Aorta: Borderline to mild aortic root dilatation. Aortic root   dimension: 39 mm (ED). - Mitral valve: Mildly thickened leaflets . - Right ventricle: Systolic function was reduced. - Tricuspid valve: There was mild regurgitation. - Pulmonary arteries: PA peak pressure: 46 mm Hg (S). - Inferior vena cava: The vessel was dilated. The respirophasic   diameter changes were blunted (< 50%), consistent with elevated   central venous pressure. Estimated CVP 15 mmHg.  Antibiotics: He was started on  vancomycin and cefepime which was discontinued on 3/9.  Subjective/Interval History: Patient denies any complaints this morning. Very hard of hearing. Not very communicative.   Objective:  Vital Signs  Vitals:   12/07/16 0536 12/07/16 0900 12/07/16 0916 12/07/16 0919  BP: 132/82   129/83  Pulse: 80   78  Resp: 18   18  Temp: 98.1 F (36.7 C)   98 F (36.7 C)  TempSrc: Oral   Oral  SpO2: 99% 98% 97% 98%  Weight:      Height:        Intake/Output Summary (Last 24 hours) at 12/07/16 0946 Last data filed at 12/07/16 0921  Gross per 24 hour  Intake           423.33 ml  Output              900 ml  Net          -476.67 ml   Filed Weights   12/04/16 0318 12/06/16 1454 12/06/16 2124  Weight: 48.8 kg (107 lb 9.4 oz) 49.2 kg (108 lb 7.5 oz) 50.4 kg (111 lb 1.8 oz)    General appearance: alert, distracted and no distress Resp: clear to auscultation bilaterally. Coarse breath sounds are noted.N o wheezing, crackles or rhonchi. Cardio: regular rate and rhythm, S1, S2 normal, no murmur, click, rub or gallop GI: soft, non-tender; bowel sounds normal; no masses,  no organomegaly Extremities: extremities normal, atraumatic, no cyanosis or edema Neurologic: Awake and alert. Somewhat distracted. No obvious facial asymmetry. Moving all his extremities.  Lab Results:  Data Reviewed: I have personally reviewed following labs and imaging studies  CBC:  Recent Labs Lab 12/03/16 2010 12/04/16 1962 12/05/16 0136 12/06/16 2297 12/07/16 9892  WBC 12.0* 9.6 3.6* 2.6* 5.4  NEUTROABS 11.2*  --   --   --   --   HGB 13.0 12.5* 10.9* 9.6* 9.4*  HCT 38.0* 37.9* 33.5* 29.4* 28.7*  MCV 86.8 88.8 89.1 89.9 88.6  PLT 157 145* 115* 75* 59*    Basic Metabolic Panel:  Recent Labs Lab 12/04/16 0033 12/04/16 0348 12/05/16 0136 12/06/16 0436 12/07/16 0639  NA 137 136 140 137 137  K 3.7 3.5 3.8 3.4* 3.3*  CL 107 106 110 110 105  CO2 21* 18* 19* 23 24  GLUCOSE 107* 105* 108* 112* 122*    BUN 49* 42* 25* 19 15  CREATININE 0.74 0.76 0.57* 0.38* 0.40*  CALCIUM 7.9* 8.0* 8.2* 8.3* 8.2*    GFR: Estimated Creatinine Clearance: 64.8 mL/min (by C-G formula based on SCr of 0.4 mg/dL (L)).  Liver Function Tests:  Recent Labs Lab 12/04/16 0348 12/04/16 1324 12/05/16 0136 12/06/16 0436 12/07/16 0639  AST 1,213* 722* 397* 176* 98*  ALT 4,254* 3,596* 2,587* 1,702* 1,111*  ALKPHOS 116 105 118 130* 113  BILITOT 1.9* 1.4* 1.3* 1.4* 0.7  PROT 5.4* 5.0* 4.8* 4.5* 4.8*  ALBUMIN 2.8* 2.7* 2.4* 2.3* 2.4*     Recent Labs Lab 12/03/16 2333  LIPASE 23    Recent Labs Lab 12/04/16 0108  AMMONIA 27    Coagulation Profile:  Recent Labs Lab 12/03/16 1950 12/04/16 0348 12/04/16 1324 12/05/16 0136  INR 1.53 1.66 1.57 1.39    Cardiac Enzymes:  Recent Labs Lab 12/04/16 0807 12/04/16 1324 12/04/16 2015  TROPONINI 0.06* 0.10* 0.05*    Recent Results (from the past 240 hour(s))  Blood Culture (routine x 2)     Status: None (Preliminary result)   Collection Time: 12/03/16  7:25 PM  Result Value Ref Range Status   Specimen Description BLOOD LEFT HAND  Final   Special Requests BOTTLES DRAWN AEROBIC AND ANAEROBIC 5CC EACH  Final   Culture   Final    NO GROWTH 3 DAYS Performed at [facility], [place] [address], [place], [place] [address]    Report Status PENDING  Incomplete  Blood Culture (routine x 2)     Status: None (Preliminary result)   Collection Time: 12/03/16  7:50 PM  Result Value Ref Range Status   Specimen Description BLOOD BLOOD RIGHT FOREARM  Final   Special Requests BOTTLES DRAWN AEROBIC AND ANAEROBIC 5CC EACH  Final   Culture   Final    NO GROWTH 3 DAYS Performed at [facility], [place] [address] [place], [place] [address]    Report Status PENDING  Incomplete  MRSA PCR Screening     Status: None   Collection Time: 12/04/16  3:27 AM  Result Value Ref Range Status   MRSA by PCR NEGATIVE NEGATIVE Final    Comment:        The GeneXpert  MRSA Assay (FDA approved for NASAL specimens only), is one component of a comprehensive MRSA colonization surveillance program. It is not intended to diagnose MRSA infection nor to guide or monitor treatment for MRSA infections.       Radiology Studies: No results found.   Medications:  Scheduled: . mouth rinse  15 mL Mouth Rinse BID  . metoprolol tartrate  12.5 mg Oral BID  . mometasone-formoterol  2 puff Inhalation BID  . nicotine  14 mg Transdermal Daily  . pantoprazole  40 mg Oral Daily  . sodium chloride flush  3 mL Intravenous Q12H  .  tiotropium  18 mcg Inhalation Daily   Continuous:  HFW:YOVZCHYIF, ipratropium, levalbuterol, ondansetron **OR** ondansetron (ZOFRAN) IV  Assessment/Plan:  Principal Problem:   Acetaminophen toxicity Active Problems:   COPD (chronic obstructive pulmonary disease) (HCC)   Tobacco abuse   Hard of hearing   CAD (nonobstructive per cath 2015)   HTN (hypertension)   ALF (acute liver failure)   Acetaminophen overdose of undetermined intent   Elevated troponin   Abnormal thyroid function test   Normocytic anemia    Acetaminophen toxicity , acute liver injury This appears to be an unintentional overdose while he was treating his migraine headache earlier this week. Acetaminophen levels were elevated. Patient was started on intravenous N-acetylcysteine. Poison control was contacted. Patient's acetaminophen levels have become undetectable. LFTs are trending down. INR has improved. N-acetylcysteine was discontinued after approximately 24 hours per poison control. Ammonia level was normal. Right upper quadrant ultrasound did not show any acute findings. Hepatitis panel is negative. HIV is nonreactive.   Nonanion gap metabolic acidosis Possibly due to the above. Much improved.   Dehydration S/P aggressive hydration in the ED. Hydration status has improved.   Concern for sepsis with lactic acidosis in the ED Sepsis was ruled out.  Elevated lactic acid level, likely due to liver injury. Lactate became normal. Patient was empirically started on broad-spectrum antibiotics. Patient is afebrile. Has a normal WBC. No foci of infection is noted. Blood cultures are negative so far. Antibiotics were discontinued. He remains afebrile. WBC remains normal.  Elevated troponin/history of nonobstructive CAD/history of nonischemic cardiomyopathy Troponins are only minimally elevated without any significant rise. EKG was of poor quality. Repeat EKG did not show ischemic changes. Patient denies any chest pain currently. Echocardiogram is as above. Diminished ejection fraction as noted. Old records reviewed. Patient was noted to have nonischemic cardiomyopathy back in 2015. He underwent a cardiac catheterization at that time which did not reveal any coronary artery disease. He was placed on ACE inhibitor and beta blocker. His ejection fraction actually improved. Patient likely has not been compliant. Placed back on beta blocker. Heart rate has improved. No further cardiac workup at this time.   Abnormal TFT's TSH is 0.09. Free T4 mildly elevated at 1.17. Patient does not have any other signs or symptoms of hyperthyroidism except for sinus tachycardia. He was started on metoprolol. Would recommend that these thyroid function test repeated in the outpatient setting. Will not initiate definitive treatment considering his other acute problems.  Thrombocytopenia Platelet counts remain low. No evidence for bleeding. He is not on any heparin products. Etiology is unclear. O-27 and folic acid levels are normal. Downward trend could be due to acute illness and might improve eventually. Continue to trend.   History of COPD Appears compensated. Seems to be stable. Nebulizer treatments as needed.  Active tobacco use Patient has no plans to stop smoking. Nicotine patch.  Normocytic Anemia No evidence for bleeding. Anemia panel reviewed. No clear-cut  deficiency is identified. Hemoglobin stable.   Social situation Patient apparently lives in a motel. When he was picked up by EMS he was found to be living in squalid conditions. There was feces and urine around the room. Social worker has been consulted. Patient will benefit from going to skilled nursing facility for short-term rehabilitation to begin with. Looks like his son wants the same. Patient apparently has refused placement to skilled nursing facility in the past.We will get psychiatry to see him to determine capacity.  DVT Prophylaxis: SCD's   Code Status:  Full code  Family Communication: Tried calling son with no success today. We will try again later. Disposition Plan: Management as outlined above. Continue to mobilize. Await psychiatry input. Anticipate discharge in the next 2-3 days.    LOS: 3 days   [facility] Pager [phone] 12/07/2016, 9:46 AM  If 7PM-7AM, please contact night-coverage at [email or web address], password [email or web address]

## 2016-12-08 DIAGNOSIS — I428 Other cardiomyopathies: Secondary | ICD-10-CM

## 2016-12-08 LAB — COMPREHENSIVE METABOLIC PANEL
ALBUMIN: 2.4 g/dL — AB (ref 3.5–5.0)
ALT: 830 U/L — ABNORMAL HIGH (ref 17–63)
AST: 56 U/L — AB (ref 15–41)
Alkaline Phosphatase: 109 U/L (ref 38–126)
Anion gap: 9 (ref 5–15)
BILIRUBIN TOTAL: 0.9 mg/dL (ref 0.3–1.2)
BUN: 13 mg/dL (ref 6–20)
CHLORIDE: 103 mmol/L (ref 101–111)
CO2: 24 mmol/L (ref 22–32)
CREATININE: 0.36 mg/dL — AB (ref 0.61–1.24)
Calcium: 8 mg/dL — ABNORMAL LOW (ref 8.9–10.3)
GFR calc Af Amer: >60 mL/min (ref >60)
GFR calc non Af Amer: >60 mL/min (ref >60)
GLUCOSE: 98 mg/dL (ref 65–99)
Potassium: 3.5 mmol/L (ref 3.5–5.1)
Sodium: 136 mmol/L (ref 135–145)
Total Protein: 4.8 g/dL — ABNORMAL LOW (ref 6.5–8.1)

## 2016-12-08 LAB — CULTURE, BLOOD (ROUTINE X 2)
CULTURE: NO GROWTH
Culture: NO GROWTH

## 2016-12-08 LAB — HSV(HERPES SMPLX)ABS-I+II(IGG+IGM)-BLD
HSV 1 GLYCOPROTEIN G AB, IGG: 0.98 {index} — AB (ref 0.00–0.90)
HSV 2 Glycoprotein G Ab, IgG: <0.91 index (ref 0.00–0.90)
HSVI/II Comb IgM: <0.91 Ratio (ref 0.00–0.90)

## 2016-12-08 LAB — CBC
HEMATOCRIT: 27.6 % — AB (ref 39.0–52.0)
Hemoglobin: 9.1 g/dL — ABNORMAL LOW (ref 13.0–17.0)
MCH: 29.2 pg (ref 26.0–34.0)
MCHC: 33 g/dL (ref 30.0–36.0)
MCV: 88.5 fL (ref 78.0–100.0)
PLATELETS: 57 10*3/uL — AB (ref 150–400)
RBC: 3.12 MIL/uL — AB (ref 4.22–5.81)
RDW: 14 % (ref 11.5–15.5)
WBC: 11.2 10*3/uL — AB (ref 4.0–10.5)

## 2016-12-08 LAB — CMV DNA, QUANTITATIVE, PCR
CMV DNA Quant: NEGATIVE IU/mL
Log10 CMV Qn DNA Pl: UNDETERMINED log10 IU/mL

## 2016-12-08 MED ORDER — ENSURE ENLIVE PO LIQD
237.0000 mL | Freq: Three times a day (TID) | ORAL | Status: DC
  Administered 2016-12-09 – 2016-12-10 (×4): 237 mL via ORAL

## 2016-12-08 NOTE — Care Management Important Message (Signed)
Important Message  Patient Details  Name: Anthony Bolton MRN: [id number] Date of Birth: March 20, 1951   Medicare Important Message Given:  Yes    Orbie Pyo 12/08/2016, 12:40 PM

## 2016-12-08 NOTE — [place] FL2 (Signed)
[place] MEDICAID FL2 LEVEL OF CARE SCREENING TOOL     IDENTIFICATION  Patient Name: Anthony Bolton Birthdate: 1951/08/12 Sex: male Admission Date (Current Location): 12/03/2016  [facility] and [place] Number:  Herbalist and Address:  [facility]. [facility], [place] [address] [place], [place] [address]      Provider Number: [id number]  Attending Physician Name and Address:  Bonnielee Haff, MD  Relative Name and Phone Number:  Ronrico Dupin - son - [phone]    Current Level of Care: Hospital Recommended Level of Care: [employer] Prior Approval Number:    Date Approved/Denied:   PASRR Number: [id number]  Discharge Plan: SNF    Current Diagnoses: Patient Active Problem List   Diagnosis Date Noted  . Abnormal thyroid function test   . Normocytic anemia   . Elevated troponin   . Acetaminophen toxicity 12/04/2016  . ALF (acute liver failure) 12/04/2016  . Acetaminophen overdose of undetermined intent   . 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 Aortic atherosclerosis ([place]) 10/22/2016  . Protein-calorie malnutrition, severe 10/07/2016  . Acute respiratory failure with hypoxia ([place]) 10/07/2016  . Other emphysema ([place]) 10/07/2016  . Transaminitis 12/03/2015  . Dehydration   . High anion gap metabolic acidosis 84/69/6295  . Malnutrition due to starvation ([place]) 12/02/2015  . Leukocytosis 12/02/2015  . Abdominal pain 10/12/2015  . Nausea & vomiting 10/12/2015  . Chest pain 10/12/2015  . AKI (acute kidney injury) ([place]) 10/12/2015  . SBO (small bowel obstruction) 10/12/2015  . Acute respiratory failure ([place]) 08/11/2015  . Hard of hearing 08/11/2015  . CAD (nonobstructive per cath 2015) 08/11/2015  . HTN (hypertension) 08/11/2015  . HLD (hyperlipidemia)   . Malnutrition of moderate degree ([place]) 03/02/2015  . COPD exacerbation ([place]) 03/01/2015  . Chronic systolic congestive heart failure, NYHA class 3 ([place]) 03/01/2015  . Hyperthyroidism  11/06/2013  . Mass of adrenal gland ([place]) 11/04/2013  . Tobacco abuse 11/04/2013  . Hyponatremia 10/31/2013  . COPD (chronic obstructive pulmonary disease) ([place]) 10/31/2013    Orientation RESPIRATION BLADDER Height & Weight     Self, Time, Situation, Place  O2 (2 Liters) Incontinent Weight: 47.4 kg (104 lb 8 oz) Height:  5\' 9"  (175.3 cm)  BEHAVIORAL SYMPTOMS/MOOD NEUROLOGICAL BOWEL NUTRITION STATUS      Continent Diet (DYS 3)  AMBULATORY STATUS COMMUNICATION OF NEEDS Skin   Limited Assist Verbally Normal                       Personal Care Assistance Level of Assistance  Bathing, Feeding, Dressing Bathing Assistance: Limited assistance Feeding assistance: Independent Dressing Assistance: Limited assistance     Functional Limitations Info  Sight, Hearing, Speech Sight Info: Adequate Hearing Info: Impaired (Hard of Hearing) Speech Info: Adequate    SPECIAL CARE FACTORS FREQUENCY  PT (By licensed PT), Speech therapy     PT Frequency: PT Eval completed on 12/05/2016 with recommendations of 3x/week minimum.      Speech Therapy Frequency: SLP Eval completed on 12/05/2016 with no follow up recommendations.      Contractures Contractures Info: Not present    Additional Factors Info  Code Status, Allergies Code Status Info: Full Code Allergies Info: NKDA           Current Medications (12/08/2016):  This is the current hospital active medication list Current Facility-Administered Medications  Medication Dose Route Frequency Provider Last Rate Last Dose  . ibuprofen (ADVIL,MOTRIN) tablet 400 mg  400 mg  Oral TID PRN Bonnielee Haff, MD   400 mg at 12/06/16 2240  . ipratropium (ATROVENT) nebulizer solution 0.5 mg  0.5 mg Nebulization Q4H PRN Bonnielee Haff, MD   0.5 mg at 12/07/16 2217  . levalbuterol (XOPENEX) nebulizer solution 0.63 mg  0.63 mg Nebulization Q4H PRN Bonnielee Haff, MD   0.63 mg at 12/07/16 2217  . MEDLINE mouth rinse  15 mL Mouth Rinse BID Rise Patience, MD   15 mL at 12/07/16 2218  . metoprolol tartrate (LOPRESSOR) tablet 12.5 mg  12.5 mg Oral BID Bonnielee Haff, MD   12.5 mg at 12/07/16 2217  . mometasone-formoterol (DULERA) 100-5 MCG/ACT inhaler 2 puff  2 puff Inhalation BID Bonnielee Haff, MD   2 puff at 12/08/16 1030  . nicotine (NICODERM CQ - dosed in mg/24 hours) patch 14 mg  14 mg Transdermal Daily Bonnielee Haff, MD   14 mg at 12/07/16 1051  . ondansetron (ZOFRAN) tablet 4 mg  4 mg Oral Q6H PRN Lily Kocher, MD   4 mg at 12/06/16 1043   Or  . ondansetron (ZOFRAN) injection 4 mg  4 mg Intravenous Q6H PRN Lily Kocher, MD      . pantoprazole (PROTONIX) EC tablet 40 mg  40 mg Oral Daily Manus Gunning, MD   40 mg at 12/07/16 1052  . sodium chloride flush (NS) 0.9 % injection 3 mL  3 mL Intravenous Q12H Lily Kocher, MD   3 mL at 12/07/16 2218  . tiotropium (SPIRIVA) inhalation capsule 18 mcg  18 mcg Inhalation Daily Bonnielee Haff, MD   18 mcg at 12/08/16 1030     Discharge Medications: Please see discharge summary for a list of discharge medications.  Relevant Imaging Results:  Relevant Lab Results:   Additional Information SSN:  [id number]  Lajoyce Lauber Work [phone]

## 2016-12-08 NOTE — Progress Notes (Addendum)
[facility] PROGRESS NOTE  LENORRIS KARGER GQQ:[id number] DOB: 16-Mar-1951 DOA: 12/03/2016  PCP: [name], MD  Brief History/Interval Summary: 66 y.o. gentleman with a history of nonobstructing CAD, COPD (he is not on home oxygen), active tobacco use with dependence, prior CHF (LV systolic function normal on last echo in 2016), and hard of hearing who called EMS for evaluation of chest pain and shortness of breath. The patient is a poor historian. Patient was experiencing migraine headaches and took unclear amount of acetaminophen, NSAIDs and aspirin. No clear reason was found for his chest pain or shortness of breath. However, he was found to have elevated liver function tests. He also had elevated Tylenol level. He was admitted for further management of Tylenol toxicity.   Reason for Visit: Acetaminophen toxicity with acute liver failure  Consultants: Gastroenterology. Critical care medicine.  Procedures:   Transthoracic echocardiogram Study Conclusions  - Procedure narrative: Transthoracic echocardiography. Image   quality was suboptimal. - Left ventricle: The cavity size was normal. Systolic function was   mildly reduced. The estimated ejection fraction was 45%. Diffuse   hypokinesis. Doppler parameters are consistent with abnormal left   ventricular relaxation (grade 1 diastolic dysfunction). MIld   septal thickening. - Aortic valve: There was trivial regurgitation. - Aorta: Borderline to mild aortic root dilatation. Aortic root   dimension: 39 mm (ED). - Mitral valve: Mildly thickened leaflets . - Right ventricle: Systolic function was reduced. - Tricuspid valve: There was mild regurgitation. - Pulmonary arteries: PA peak pressure: 46 mm Hg (S). - Inferior vena cava: The vessel was dilated. The respirophasic   diameter changes were blunted (< 50%), consistent with elevated   central venous pressure. Estimated CVP 15 mmHg.  Antibiotics: He was started on  vancomycin and cefepime which was discontinued on 3/9.  Subjective/Interval History: Patient denies any complaints this morning. Very hard of hearing. Not very communicative.   Objective:  Vital Signs  Vitals:   12/08/16 0643 12/08/16 1017 12/08/16 1030 12/08/16 1032  BP: 135/86 128/80    Pulse: 73 76    Resp: 18 14    Temp: 99.1 F (37.3 C) 98.3 F (36.8 C)    TempSrc: Oral Oral    SpO2: 100% 100% 99% 99%  Weight:      Height:        Intake/Output Summary (Last 24 hours) at 12/08/16 1203 Last data filed at 12/08/16 1017  Gross per 24 hour  Intake              480 ml  Output              900 ml  Net             -420 ml   Filed Weights   12/06/16 1454 12/06/16 2124 12/07/16 2150  Weight: 49.2 kg (108 lb 7.5 oz) 50.4 kg (111 lb 1.8 oz) 47.4 kg (104 lb 8 oz)    General appearance: alert, distracted and no distress Resp: clear to auscultation bilaterally. Coarse breath sounds are noted. No wheezing, crackles or rhonchi. Cardio: regular rate and rhythm, S1, S2 normal, no murmur, click, rub or gallop GI: soft, non-tender; bowel sounds normal; no masses,  no organomegaly Extremities: extremities normal, atraumatic, no cyanosis or edema Neurologic: Awake and alert. Somewhat distracted. No obvious facial asymmetry. Moving all his extremities.  Lab Results:  Data Reviewed: I have personally reviewed following labs and imaging studies  CBC:  Recent Labs Lab 12/03/16 2010 12/04/16 0348 12/05/16  0136 12/06/16 0436 12/07/16 0639  WBC 12.0* 9.6 3.6* 2.6* 5.4  NEUTROABS 11.2*  --   --   --   --   HGB 13.0 12.5* 10.9* 9.6* 9.4*  HCT 38.0* 37.9* 33.5* 29.4* 28.7*  MCV 86.8 88.8 89.1 89.9 88.6  PLT 157 145* 115* 75* 59*    Basic Metabolic Panel:  Recent Labs Lab 12/04/16 0033 12/04/16 0348 12/05/16 0136 12/06/16 0436 12/07/16 0639  NA 137 136 140 137 137  K 3.7 3.5 3.8 3.4* 3.3*  CL 107 106 110 110 105  CO2 21* 18* 19* 23 24  GLUCOSE 107* 105* 108* 112* 122*    BUN 49* 42* 25* 19 15  CREATININE 0.74 0.76 0.57* 0.38* 0.40*  CALCIUM 7.9* 8.0* 8.2* 8.3* 8.2*    GFR: Estimated Creatinine Clearance: 60.9 mL/min (by C-G formula based on SCr of 0.4 mg/dL (L)).  Liver Function Tests:  Recent Labs Lab 12/04/16 0348 12/04/16 1324 12/05/16 0136 12/06/16 0436 12/07/16 0639  AST 1,213* 722* 397* 176* 98*  ALT 4,254* 3,596* 2,587* 1,702* 1,111*  ALKPHOS 116 105 118 130* 113  BILITOT 1.9* 1.4* 1.3* 1.4* 0.7  PROT 5.4* 5.0* 4.8* 4.5* 4.8*  ALBUMIN 2.8* 2.7* 2.4* 2.3* 2.4*     Recent Labs Lab 12/03/16 2333  LIPASE 23    Recent Labs Lab 12/04/16 0108  AMMONIA 27    Coagulation Profile:  Recent Labs Lab 12/03/16 1950 12/04/16 0348 12/04/16 1324 12/05/16 0136  INR 1.53 1.66 1.57 1.39    Cardiac Enzymes:  Recent Labs Lab 12/04/16 0807 12/04/16 1324 12/04/16 2015  TROPONINI 0.06* 0.10* 0.05*    Recent Results (from the past 240 hour(s))  Blood Culture (routine x 2)     Status: None (Preliminary result)   Collection Time: 12/03/16  7:25 PM  Result Value Ref Range Status   Specimen Description BLOOD LEFT HAND  Final   Special Requests BOTTLES DRAWN AEROBIC AND ANAEROBIC 5CC EACH  Final   Culture   Final    NO GROWTH 4 DAYS Performed at [facility], [place] [address] [place], [place] [address]    Report Status PENDING  Incomplete  Blood Culture (routine x 2)     Status: None (Preliminary result)   Collection Time: 12/03/16  7:50 PM  Result Value Ref Range Status   Specimen Description BLOOD BLOOD RIGHT FOREARM  Final   Special Requests BOTTLES DRAWN AEROBIC AND ANAEROBIC 5CC EACH  Final   Culture   Final    NO GROWTH 4 DAYS Performed at [facility], [place] [address], [place], [place] [address]    Report Status PENDING  Incomplete  MRSA PCR Screening     Status: None   Collection Time: 12/04/16  3:27 AM  Result Value Ref Range Status   MRSA by PCR NEGATIVE NEGATIVE Final    Comment:        The GeneXpert  MRSA Assay (FDA approved for NASAL specimens only), is one component of a comprehensive MRSA colonization surveillance program. It is not intended to diagnose MRSA infection nor to guide or monitor treatment for MRSA infections.       Radiology Studies: No results found.   Medications:  Scheduled: . mouth rinse  15 mL Mouth Rinse BID  . metoprolol tartrate  12.5 mg Oral BID  . mometasone-formoterol  2 puff Inhalation BID  . nicotine  14 mg Transdermal Daily  . pantoprazole  40 mg Oral Daily  . sodium chloride  flush  3 mL Intravenous Q12H  . tiotropium  18 mcg Inhalation Daily   Continuous:  QQP:[id number], ipratropium, levalbuterol, ondansetron **OR** ondansetron (ZOFRAN) IV  Assessment/Plan:  Principal Problem:   Acetaminophen toxicity Active Problems:   COPD (chronic obstructive pulmonary disease) (HCC)   Tobacco abuse   Hard of hearing   CAD (nonobstructive per cath 2015)   HTN (hypertension)   ALF (acute liver failure)   Acetaminophen overdose of undetermined intent   Elevated troponin   Abnormal thyroid function test   Normocytic anemia    Acetaminophen toxicity , acute liver injury This appears to be an unintentional overdose while he was treating his migraine headache earlier this week. Acetaminophen levels were elevated. Patient was started on intravenous N-acetylcysteine. Poison control was contacted. Patient's acetaminophen levels have become undetectable. LFTs are trending down. INR has improved. N-acetylcysteine was discontinued after approximately 24 hours per poison control. Ammonia level was normal. Right upper quadrant ultrasound did not show any acute findings. Hepatitis panel is negative. HIV is nonreactive. Await further improvement in LFTs.  Nonanion gap metabolic acidosis Possibly due to the above. Much improved.   Dehydration S/P aggressive hydration in the ED. Hydration status has improved.   Concern for sepsis with lactic acidosis in  the ED Sepsis was ruled out. Elevated lactic acid level, likely due to liver injury. Lactate became normal. Patient was empirically started on broad-spectrum antibiotics. Patient is afebrile. Has a normal WBC. No foci of infection is noted. Blood cultures are negative so far. Antibiotics were discontinued. He remains afebrile. WBC remains normal.  Elevated troponin/history of nonobstructive CAD/history of nonischemic cardiomyopathy Troponins were only minimally elevated without any significant rise. EKG was of poor quality. Repeat EKG did not show ischemic changes. Patient denies any chest pain currently. Echocardiogram is as above. Diminished ejection fraction as noted. Old records reviewed. Patient was noted to have nonischemic cardiomyopathy back in 2015. He underwent a cardiac catheterization at that time which did not reveal any coronary artery disease. He was placed on ACE inhibitor and beta blocker. His ejection fraction actually improved. Patient likely has not been compliant. Placed back on beta blocker. Heart rate has improved. No further cardiac workup at this time.  Outpatient follow-up with Dr. Terrence Dupont. Patient is currently euvolemic.  Abnormal TFT's TSH is 0.09. Free T4 mildly elevated at 1.17. Patient does not have any other signs or symptoms of hyperthyroidism except for sinus tachycardia. He was started on metoprolol. Would recommend that these thyroid function test repeated in the outpatient setting. Will not initiate definitive treatment considering his other acute problems.  Thrombocytopenia Platelet counts remain low. No evidence for bleeding. He is not on any heparin products. Etiology is unclear. Z-12 and folic acid levels are normal. Downward trend could be due to acute illness and might improve eventually. Continue to trend. Labs not done today. Will be ordered.  History of COPD Appears compensated. Seems to be stable. Nebulizer treatments as needed.  Active tobacco  use Patient has no plans to stop smoking. Nicotine patch.  Normocytic Anemia No evidence for bleeding. Anemia panel reviewed. No clear-cut deficiency is identified. Hemoglobin stable.   Social situation Patient apparently lives in a motel. When he was picked up by EMS he was found to be living in squalid conditions. There was feces and urine around the room. Social worker has been consulted. Patient will benefit from going to skilled nursing facility for short-term rehabilitation to begin with. Looks like his son wants the same. Patient apparently  has refused placement to skilled nursing facility in the past.Psychiatry was consulted to assess capacity. Seen by them. They have determined that the patient lacks capacity.  DVT Prophylaxis: SCD's   Code Status: Full code  Family Communication: Tried calling son with no success today. We will try again later. Disposition Plan: Management as outlined above. Continue to mobilize. Await further improvement in LFTs and stabilization of platelet counts.    LOS: 4 days   [facility] Pager [phone] 12/08/2016, 12:03 PM  If 7PM-7AM, please contact night-coverage at [email or web address], password [facility]

## 2016-12-08 NOTE — Progress Notes (Signed)
Nutrition Follow-up  DOCUMENTATION CODES:   Severe malnutrition in context of chronic illness, Underweight  INTERVENTION:  Provide Ensure Enlive po TID, each supplement provides 350 kcal and 20 grams of protein.  Encourage adequate PO intake.   NUTRITION DIAGNOSIS:   Malnutrition related to chronic illness as evidenced by severe depletion of body fat, severe depletion of muscle mass; ongoing  GOAL:   Patient will meet greater than or equal to 90% of their needs; progressing  MONITOR:   PO intake, Supplement acceptance, Labs, Weight trends, I & O's  REASON FOR ASSESSMENT:   Malnutrition Screening Tool    ASSESSMENT:   66 y/o Male with history of CHF, COPD, CAD, hearing loss, who presented to the ER with shortness of breath and a headache. He is a very poor historian and difficult to clarify what and how much he took for headache treatment, but reported to tylenol intake for headache. In the ER he had an elevated lactic acid, CXR showed COPD changes, also had CTA chest which ruled out PE. EKG showed some ST depression and mildly positive troponin, cardiology contacted with low suspicion for ACS.   Meal completion has been varied from 10-100%. Pt was asleep during time of visit and did not wake. Pt is currently on a dysphagia 3 diet with thin liquids. RD to order Ensure to aid in caloric and protein needs. Labs and medications reviewed.   Diet Order:  DIET DYS 3 Room service appropriate? Yes; Fluid consistency: Thin  Skin:  Reviewed, no issues  Last BM:  3/10  Height:   Ht Readings from Last 1 Encounters:  12/04/16 5\' 9"  (1.753 m)    Weight:   Wt Readings from Last 1 Encounters:  12/07/16 104 lb 8 oz (47.4 kg)    Ideal Body Weight:  73 kg  BMI:  Body mass index is 15.43 kg/m.  Estimated Nutritional Needs:   Kcal:  1700-1900  Protein:  80-90 gm  Fluid:  1.7-1.9 L  EDUCATION NEEDS:   No education needs identified at this time  Corrin Parker, MS, RD,  LDN Pager # [phone] After hours/ weekend pager # [phone]

## 2016-12-08 NOTE — Progress Notes (Signed)
Social work consult received for short term rehab.  Patient is a current resident of [name], however PT is recommending ST rehab.  Per psych consult, patient lacks capacity to make medical decisions.  CSW Intern called patient's next of kin - Anthony Bolton (son) twice at [phone] (12:29 pm left message on voicemail 1:27 pm - did not leave voicemail). CSW Intern also located a different phone number for Anthony Bolton in chart [phone]) and it is not in service.  CSW Intern called patient's friend Anthony Bolton (listed in chart as friend) at [phone] and unable to leave message. Reviewed previous CSW's 3/9 progress note which made reference to contact with Adult Protective Services. CSW Intern contacted APS and spoke with Adult Services Social Worker Anthony Bolton at [phone] who confirmed that [name] social worker is Anthony Bolton [phone]).  Anthony Bolton is attempting to locate Anthony Bolton for information regarding patient's guardianship  and will give CSW Intern a call back with that information.   Placement is pending determination of patient's guardianship status with the Department of Social Services.    Anthony Bolton, [place] Intern Social Work

## 2016-12-09 ENCOUNTER — Inpatient Hospital Stay (HOSPITAL_COMMUNITY): Payer: Medicare Other

## 2016-12-09 DIAGNOSIS — T391X4A Poisoning by 4-Aminophenol derivatives, undetermined, initial encounter: Secondary | ICD-10-CM

## 2016-12-09 DIAGNOSIS — R946 Abnormal results of thyroid function studies: Secondary | ICD-10-CM

## 2016-12-09 LAB — COMPREHENSIVE METABOLIC PANEL
ALT: 633 U/L — AB (ref 17–63)
AST: 40 U/L (ref 15–41)
Albumin: 2.5 g/dL — ABNORMAL LOW (ref 3.5–5.0)
Alkaline Phosphatase: 100 U/L (ref 38–126)
Anion gap: 9 (ref 5–15)
BUN: 11 mg/dL (ref 6–20)
CHLORIDE: 103 mmol/L (ref 101–111)
CO2: 25 mmol/L (ref 22–32)
CREATININE: 0.37 mg/dL — AB (ref 0.61–1.24)
Calcium: 8.1 mg/dL — ABNORMAL LOW (ref 8.9–10.3)
Glucose, Bld: 93 mg/dL (ref 65–99)
POTASSIUM: 3.5 mmol/L (ref 3.5–5.1)
Sodium: 137 mmol/L (ref 135–145)
Total Bilirubin: 0.9 mg/dL (ref 0.3–1.2)
Total Protein: 4.9 g/dL — ABNORMAL LOW (ref 6.5–8.1)

## 2016-12-09 LAB — CBC
HCT: 28.4 % — ABNORMAL LOW (ref 39.0–52.0)
Hemoglobin: 9.5 g/dL — ABNORMAL LOW (ref 13.0–17.0)
MCH: 29.6 pg (ref 26.0–34.0)
MCHC: 33.5 g/dL (ref 30.0–36.0)
MCV: 88.5 fL (ref 78.0–100.0)
PLATELETS: 59 10*3/uL — AB (ref 150–400)
RBC: 3.21 MIL/uL — AB (ref 4.22–5.81)
RDW: 14.1 % (ref 11.5–15.5)
WBC: 10.4 10*3/uL (ref 4.0–10.5)

## 2016-12-09 MED ORDER — ENSURE ENLIVE PO LIQD: 237.0000 mL | Freq: Three times a day (TID) | ORAL | 12 refills | Status: DC

## 2016-12-09 MED ORDER — MOMETASONE FURO-FORMOTEROL FUM 100-5 MCG/ACT IN AERO: 2.0000 | INHALATION_SPRAY | Freq: Two times a day (BID) | RESPIRATORY_TRACT | 0 refills | Status: DC

## 2016-12-09 MED ORDER — NICOTINE 14 MG/24HR TD PT24: 14.0000 mg | MEDICATED_PATCH | Freq: Every day | TRANSDERMAL | 0 refills | Status: DC

## 2016-12-09 MED ORDER — POLYETHYLENE GLYCOL 3350 17 G PO PACK
17.0000 g | PACK | Freq: Every day | ORAL | Status: DC
  Administered 2016-12-09 – 2016-12-10 (×2): 17 g via ORAL
  Filled 2016-12-09 (×2): qty 1

## 2016-12-09 MED ORDER — PANTOPRAZOLE SODIUM 40 MG PO TBEC: 40.0000 mg | DELAYED_RELEASE_TABLET | Freq: Every day | ORAL | 1 refills | Status: DC

## 2016-12-09 MED ORDER — METOPROLOL TARTRATE 25 MG PO TABS: 12.5000 mg | ORAL_TABLET | Freq: Two times a day (BID) | ORAL | 0 refills | Status: DC

## 2016-12-09 NOTE — Clinical Social Work Placement (Addendum)
  12/09/16 - CSW informed that patient will not discharge today. Santiago Glad, admissions director with [facility] and patient's son Dellis Filbert contacted and advised.  CLINICAL SOCIAL WORK PLACEMENT  NOTE  Date:  12/09/2016  Patient Details  Name: Anthony Bolton MRN: [id number] Date of Birth: 1951-05-13  Clinical Social Work is seeking post-discharge placement for this patient at the [place] level of care (*CSW will initial, date and re-position this form in  chart as items are completed):  Yes (Son [place] emailed SNF list)   Patient/family provided with [place] Work Department's list of facilities offering this level of care within the geographic area requested by the patient (or if unable, by the patient's family).  Yes   Patient/family informed of their freedom to choose among providers that offer the needed level of care, that participate in Medicare, Medicaid or managed care program needed by the patient, have an available bed and are willing to accept the patient.  Yes   Patient/family informed of [place]'s ownership interest in [facility] and [facility], as well as of the fact that they are under no obligation to receive care at these facilities.  PASRR submitted to EDS on 12/08/16     PASRR number received on 12/08/16     Existing PASRR number confirmed on       FL2 transmitted to all facilities in geographic area requested by pt/family on 12/08/16     FL2 transmitted to all facilities within larger geographic area on       Patient informed that his/her managed care company has contracts with or will negotiate with certain facilities, including the following:        Yes (Son Dellis Filbert provided with facility responses)   Patient/family informed of bed offers received.  Patient chooses bed at       Physician recommends and patient chooses bed at      Patient to be transferred to   on  .  Patient to be transferred to  facility by       Patient family notified on   of transfer.  Name of family member notified:        PHYSICIAN       Additional Comment:    _______________________________________________ Sable Feil, LCSW 12/09/2016, 11:02 AM

## 2016-12-09 NOTE — Discharge Summary (Addendum)
Physician Discharge Summary  ADALID BECKMANN MRN: [id number] DOB/AGE: 03-Jun-1951 66 y.o.  PCP: [name], MD   Admit date: 12/03/2016 Discharge date: 12/09/2016  Discharge Diagnoses:    Principal Problem:   Acetaminophen toxicity Active Problems:   COPD (chronic obstructive pulmonary disease) (HCC)   Tobacco abuse   Hard of hearing   CAD (nonobstructive per cath 2015)   HTN (hypertension)   ALF (acute liver failure)   Acetaminophen overdose of undetermined intent   Elevated troponin   Abnormal thyroid function test   Normocytic anemia  Addendum Patient due to discharge today however, began feeling a "tightness" in his chest, requiring 2 PRN breathing treatments during shift and 2L of 02 required. Chest x-ray showed COPD, without any acute changes Patients sats 100% on room air at rest, but drops to 86% with ambulation. Placed on 2 L of oxygen    Follow-up recommendations for PCP   Follow-up with PCP in 3-5 days , including all  additional recommended appointments as below Closely monitor CBC, CMP, PT/INR  Fall precaution Repeat thyroid function tests in 4-6 weeks  Dysphagia 3 (Mech soft);Thin liquid  Liquid Administration via: Cup Medication Administration: Whole meds with liquid Supervision: Patient able to self feed Compensations: Minimize environmental distractions;Slow rate;Small sips/bites Postural Changes: Seated upright at 90 degrees   Patient lacks capacity to make his own medical decisions     Current Discharge Medication List    START taking these medications   Details  feeding supplement, ENSURE ENLIVE, (ENSURE ENLIVE) LIQD Take 237 mLs by mouth 3 (three) times daily between meals. Qty: 237 mL, Refills: 12    metoprolol tartrate (LOPRESSOR) 25 MG tablet Take 0.5 tablets (12.5 mg total) by mouth 2 (two) times daily. Qty: 60 tablet, Refills: 0    mometasone-formoterol (DULERA) 100-5 MCG/ACT AERO Inhale 2 puffs into the lungs 2 (two) times  daily. Qty: 1 Inhaler, Refills: 0    nicotine (NICODERM CQ - DOSED IN MG/24 HOURS) 14 mg/24hr patch Place 1 patch (14 mg total) onto the skin daily. Qty: 28 patch, Refills: 0    pantoprazole (PROTONIX) 40 MG tablet Take 1 tablet (40 mg total) by mouth daily. Qty: 30 tablet, Refills: 1      CONTINUE these medications which have NOT CHANGED   Details  albuterol (PROVENTIL HFA;VENTOLIN HFA) 108 (90 Base) MCG/ACT inhaler Inhale 2 puffs into the lungs every 4 (four) hours as needed for wheezing or shortness of breath. Qty: 1 Inhaler, Refills: 0    aspirin EC 81 MG tablet Take 1 tablet (81 mg total) by mouth daily. Qty: 30 tablet, Refills: 0    budesonide-formoterol (SYMBICORT) 160-4.5 MCG/ACT inhaler Inhale 2 puffs into the lungs 2 (two) times daily. Qty: 1 Inhaler, Refills: 0    ipratropium-albuterol (DUONEB) 0.5-2.5 (3) MG/3ML SOLN Take 3 mLs by nebulization every 6 (six) hours as needed (wheezing, shortness of breath). Qty: 360 mL, Refills: 0    tiotropium (SPIRIVA) 18 MCG inhalation capsule Place 1 capsule (18 mcg total) into inhaler and inhale daily. Qty: 30 capsule, Refills: 0         Discharge Condition: *Stable   Discharge Instructions Get Medicines reviewed and adjusted: Please take all your medications with you for your next visit with your Primary MD  Please request your Primary MD to go over all hospital tests and procedure/radiological results at the follow up, please ask your Primary MD to get all Hospital records sent to his/her office.  If you experience worsening of  your admission symptoms, develop shortness of breath, life threatening emergency, suicidal or homicidal thoughts you must seek medical attention immediately by calling 911 or calling your MD immediately if symptoms less severe.  You must read complete instructions/literature along with all the possible adverse reactions/side effects for all the Medicines you take and that have been prescribed to you.  Take any new Medicines after you have completely understood and accpet all the possible adverse reactions/side effects.   Do not drive when taking Pain medications.   Do not take more than prescribed Pain, Sleep and Anxiety Medications  Special Instructions: If you have smoked or chewed Tobacco in the last 2 yrs please stop smoking, stop any regular Alcohol and or any Recreational drug use.  Wear Seat belts while driving.  Please note  You were cared for by a hospitalist during your hospital stay. Once you are discharged, your primary care physician will handle any further medical issues. Please note that NO REFILLS for any discharge medications will be authorized once you are discharged, as it is imperative that you return to your primary care physician (or establish a relationship with a primary care physician if you do not have one) for your aftercare needs so that they can reassess your need for medications and monitor your lab values.  Discharge Instructions    Diet - low sodium heart healthy    Complete by:  As directed    Increase activity slowly    Complete by:  As directed        No Known Allergies    Disposition: 01-Home or Self Care   Consults:  Gastroenterology    Significant Diagnostic Studies:  Dg Chest 2 View  Result Date: 12/03/2016 CLINICAL DATA:  Chest pain EXAM: CHEST  2 VIEW COMPARISON:  10/22/2016, 10/13/2016 FINDINGS: The lungs are hyperinflated and there is bullous emphysematous disease within the upper lobes. There is mild left pleural scarring. There is no acute infiltrate. Stable cardiomediastinal silhouette. No pneumothorax. IMPRESSION: 1. Hyperinflation with bullous emphysematous disease bilaterally. 2. No acute infiltrate Electronically Signed   By: Donavan Foil M.D.   On: 12/03/2016 19:01   Ct Angio Chest Pe W And/or Wo Contrast  Result Date: 12/03/2016 CLINICAL DATA:  Shortness of breath. Chest pain for 1 day, dysuria for 2 days. History of  COPD. EXAM: CT ANGIOGRAPHY CHEST WITH CONTRAST TECHNIQUE: Multidetector CT imaging of the chest was performed using the standard protocol during bolus administration of intravenous contrast. Multiplanar CT image reconstructions and MIPs were obtained to evaluate the vascular anatomy. CONTRAST:  100 cc Isovue 370 COMPARISON:  Chest radiograph December 03, 2016 1848 hours and CT chest January 08, 2016 FINDINGS: Mild respiratory motion degraded examination. CARDIOVASCULAR: Adequate contrast opacification of the pulmonary artery's. Main pulmonary artery is not enlarged. No pulmonary arterial filling defects to the level of the subsegmental branches. Respiratory motion results in heterogeneous density RIGHT lower lobe subsegmental pulmonary arteries. Heart size is normal, no right heart strain. No pericardial effusion. Thoracic aorta is normal course and caliber, LEFT vertebral artery arises directly from the op aortic arch, normal variant. Trace calcific atherosclerosis of the aortic arch. MEDIASTINUM/NODES: No lymphadenopathy by CT size criteria. LUNGS/PLEURA: Tracheobronchial tree is patent, no pneumothorax. Mild chronic bronchial wall thickening. Severe centrilobular emphysema. No pleural effusion or focal consolidation. Hyperventilation. UPPER ABDOMEN: Included view of the abdomen is nonacute. Stable 3.9 cm benign LEFT adrenal adenoma (7 Hounsfield units). MUSCULOSKELETAL: Visualized soft tissues and included osseous structures are nonacute. Old posterior LEFT  rib fractures. Respiratory motion at multiple levels results in spurious appearance of rib fractures. Review of the MIP images confirms the above findings. IMPRESSION: No acute pulmonary embolism. Stable emphysema and chronic bronchitis. Stable large LEFT adrenal benign adenoma. Electronically Signed   By: Elon Alas M.D.   On: 12/03/2016 21:58   US Abdomen Limited Ruq  Result Date: 12/04/2016 CLINICAL DATA:  Initial evaluation for elevated liver enzymes,  bilirubin. Question hepatitis. EXAM: US ABDOMEN LIMITED - RIGHT UPPER QUADRANT COMPARISON:  Prior radiograph from 10/24/2016. FINDINGS: Gallbladder: No gallstones or wall thickening visualized. No sonographic Murphy sign noted by sonographer. Common bile duct: Diameter: 4.2 mm Liver: No focal lesion identified. Within normal limits in parenchymal echogenicity. IMPRESSION: Normal right upper quadrant ultrasound. No sonographic evidence for acute abnormality identified. Electronically Signed   By: Jeannine Boga M.D.   On: 12/04/2016 00:14        Filed Weights   12/06/16 2124 12/07/16 2150 12/08/16 2237  Weight: 50.4 kg (111 lb 1.8 oz) 47.4 kg (104 lb 8 oz) 48 kg (105 lb 12.8 oz)     Microbiology: Recent Results (from the past 240 hour(s))  Blood Culture (routine x 2)     Status: None   Collection Time: 12/03/16  7:25 PM  Result Value Ref Range Status   Specimen Description BLOOD LEFT HAND  Final   Special Requests BOTTLES DRAWN AEROBIC AND ANAEROBIC 5CC EACH  Final   Culture   Final    NO GROWTH 5 DAYS Performed at [facility], [place] [address], [place], [place] [address]    Report Status 12/08/2016 FINAL  Final  Blood Culture (routine x 2)     Status: None   Collection Time: 12/03/16  7:50 PM  Result Value Ref Range Status   Specimen Description BLOOD BLOOD RIGHT FOREARM  Final   Special Requests BOTTLES DRAWN AEROBIC AND ANAEROBIC 5CC EACH  Final   Culture   Final    NO GROWTH 5 DAYS Performed at [facility], [place] [address] [place], [place] [address]    Report Status 12/08/2016 FINAL  Final  MRSA PCR Screening     Status: None   Collection Time: 12/04/16  3:27 AM  Result Value Ref Range Status   MRSA by PCR NEGATIVE NEGATIVE Final    Comment:        The GeneXpert MRSA Assay (FDA approved for NASAL specimens only), is one component of a comprehensive MRSA colonization surveillance program. It is not intended to diagnose MRSA infection nor to guide  or monitor treatment for MRSA infections.        Blood Culture    Component Value Date/Time   SDES BLOOD BLOOD RIGHT FOREARM 12/03/2016 1950   SPECREQUEST BOTTLES DRAWN AEROBIC AND ANAEROBIC 5CC EACH 12/03/2016 1950   CULT  12/03/2016 1950    NO GROWTH 5 DAYS Performed at [facility], [place] [address], [place], [place] [address]    REPTSTATUS 12/08/2016 FINAL 12/03/2016 1950      Labs: Results for orders placed or performed during the hospital encounter of 12/03/16 (from the past 48 hour(s))  CBC     Status: Abnormal   Collection Time: 12/08/16 12:53 PM  Result Value Ref Range   WBC 11.2 (H) 4.0 - 10.5 K/uL   RBC 3.12 (L) 4.22 - 5.81 MIL/uL   Hemoglobin 9.1 (L) 13.0 - 17.0 g/dL   HCT 27.6 (L) 39.0 - 52.0 %   MCV 88.5 78.0 -  100.0 fL   MCH 29.2 26.0 - 34.0 pg   MCHC 33.0 30.0 - 36.0 g/dL   RDW 14.0 11.5 - 15.5 %   Platelets 57 (L) 150 - 400 K/uL    Comment: CONSISTENT WITH PREVIOUS RESULT  Comprehensive metabolic panel     Status: Abnormal   Collection Time: 12/08/16 12:53 PM  Result Value Ref Range   Sodium 136 135 - 145 mmol/L   Potassium 3.5 3.5 - 5.1 mmol/L   Chloride 103 101 - 111 mmol/L   CO2 24 22 - 32 mmol/L   Glucose, Bld 98 65 - 99 mg/dL   BUN 13 6 - 20 mg/dL   Creatinine, Ser 0.36 (L) 0.61 - 1.24 mg/dL   Calcium 8.0 (L) 8.9 - 10.3 mg/dL   Total Protein 4.8 (L) 6.5 - 8.1 g/dL   Albumin 2.4 (L) 3.5 - 5.0 g/dL   AST 56 (H) 15 - 41 U/L   ALT 830 (H) 17 - 63 U/L   Alkaline Phosphatase 109 38 - 126 U/L   Total Bilirubin 0.9 0.3 - 1.2 mg/dL   GFR calc non Af Amer >60 >60 mL/min   GFR calc Af Amer >60 >60 mL/min    Comment: (NOTE) The eGFR has been calculated using the CKD EPI equation. This calculation has not been validated in all clinical situations. eGFR's persistently <60 mL/min signify possible Chronic Kidney Disease.    Anion gap 9 5 - 15  CBC     Status: Abnormal   Collection Time: 12/09/16  5:50 AM  Result Value Ref Range   WBC 10.4  4.0 - 10.5 K/uL   RBC 3.21 (L) 4.22 - 5.81 MIL/uL   Hemoglobin 9.5 (L) 13.0 - 17.0 g/dL    Comment: CONSISTENT WITH PREVIOUS RESULT   HCT 28.4 (L) 39.0 - 52.0 %   MCV 88.5 78.0 - 100.0 fL   MCH 29.6 26.0 - 34.0 pg   MCHC 33.5 30.0 - 36.0 g/dL   RDW 14.1 11.5 - 15.5 %   Platelets 59 (L) 150 - 400 K/uL    Comment: CONSISTENT WITH PREVIOUS RESULT  Comprehensive metabolic panel     Status: Abnormal   Collection Time: 12/09/16  5:50 AM  Result Value Ref Range   Sodium 137 135 - 145 mmol/L   Potassium 3.5 3.5 - 5.1 mmol/L   Chloride 103 101 - 111 mmol/L   CO2 25 22 - 32 mmol/L   Glucose, Bld 93 65 - 99 mg/dL   BUN 11 6 - 20 mg/dL   Creatinine, Ser 0.37 (L) 0.61 - 1.24 mg/dL   Calcium 8.1 (L) 8.9 - 10.3 mg/dL   Total Protein 4.9 (L) 6.5 - 8.1 g/dL   Albumin 2.5 (L) 3.5 - 5.0 g/dL   AST 40 15 - 41 U/L   ALT 633 (H) 17 - 63 U/L   Alkaline Phosphatase 100 38 - 126 U/L   Total Bilirubin 0.9 0.3 - 1.2 mg/dL   GFR calc non Af Amer >60 >60 mL/min   GFR calc Af Amer >60 >60 mL/min    Comment: (NOTE) The eGFR has been calculated using the CKD EPI equation. This calculation has not been validated in all clinical situations. eGFR's persistently <60 mL/min signify possible Chronic Kidney Disease.    Anion gap 9 5 - 15     Lipid Panel     Component Value Date/Time   CHOL 219 (H) 10/22/2016 0013   TRIG 101 10/22/2016 0013   HDL  70 10/22/2016 0013   CHOLHDL 3.1 10/22/2016 0013   VLDL 20 10/22/2016 0013   LDLCALC 129 (H) 10/22/2016 0013     Lab Results  Component Value Date   HGBA1C 5.6 08/12/2015   HGBA1C 5.8 (H) 08/11/2015     Lab Results  Component Value Date   LDLCALC 129 (H) 10/22/2016   CREATININE 0.37 (L) 12/09/2016     HPI :  66 y.o.gentleman with a history of nonobstructing CAD, COPD (he is not on home oxygen), active tobacco use with dependence, prior CHF (LV systolic function normal on last echo in 2016), and hard of hearing who called EMS for evaluation of  chest pain and shortness of breath. The patient is a poor historian. Patient was experiencing migraine headaches and took unclear amount of acetaminophen, NSAIDs and aspirin. No clear reason was found for his chest pain or shortness of breath. However, he was found to have elevated liver function tests. He also had elevated Tylenol level. He was admitted for further management of Tylenol toxicity  HOSPITAL COURSE:   Acetaminophen toxicity , acute liver injury This appears to be an unintentional overdose while he was treating his migraine headache earlier this week. Acetaminophen levels were elevated. Patient was started on intravenous N-acetylcysteine. Poison control was contacted. Patient's acetaminophen levels have become undetectable. LFTs are trending down. INR has improved. N-acetylcysteine was discontinued after approximately 24 hours per poison control. Ammonia level was normal. Right upper quadrant ultrasound did not show any acute findings. Hepatitis panel is negative. HIV is nonreactive. Continue to monitor liver function tests closely AST 40, ALT 633, total bilirubin 0.9 prior to discharge  Nonanion gap metabolic acidosis Possibly due to the above. Much improved.   Dehydration S/P aggressive hydration in the ED. Hydration status has improved.   Concern for sepsis with lactic acidosis in the ED, sepsis ruled out   Elevated lactic acid level, likely due to liver injury. Lactate became normal. Patient was empirically started on broad-spectrum antibiotics. Patient is afebrile. Has a normal WBC. No foci of infection is noted. Blood cultures are negative so far. Antibiotics were discontinued. He remains afebrile. WBC remains normal.  Elevated troponin/history of nonobstructive CAD/history of nonischemic cardiomyopathy Troponins were only minimally elevated without any significant rise. EKG was of poor quality. Repeat EKG did not show ischemic changes. Patient denies any chest pain currently.  Echocardiogram is as above. Diminished ejection fraction as noted. Old records reviewed. Patient was noted to have nonischemic cardiomyopathy back in 2015. He underwent a cardiac catheterization at that time which did not reveal any coronary artery disease. He was placed on ACE inhibitor and beta blocker. His ejection fraction actually improved. Patient likely has not been compliant. Placed back on beta blocker. Heart rate has improved. No further cardiac workup at this time.  Outpatient follow-up with Dr. Terrence Dupont. Patient is currently euvolemic.  Abnormal thyroid function tests TSH is 0.09. Free T4 mildly elevated at 1.17. Patient does not have any other signs or symptoms of hyperthyroidism except for sinus tachycardia. He was started on metoprolol. Would recommend that these thyroid function test repeated in the outpatient setting in 4-6 weeks.     Thrombocytopenia Platelet counts remain low. No evidence for bleeding. He is not on any heparin products. Etiology is unclear. J-62 and folic acid levels are normal. Downward trend could be due to acute illness and might improve eventually. Continue to trend. Labs not done today.    History of COPD Appears compensated. Seems to be stable.  Nebulizer treatments as needed.  Active tobacco use Patient has no plans to stop smoking. Nicotine patch.  Normocytic Anemia No evidence for bleeding. Anemia panel reviewed. No clear-cut deficiency is identified. Hemoglobin stable.   Social situation Patient apparently lives in a motel. When he was picked up by EMS he was found to be living in squalid conditions. There was feces and urine around the room. Social worker has been consulted. Patient will benefit from going to skilled nursing facility for short-term rehabilitation to begin with. Looks like his son wants the same. Patient apparently has refused placement to skilled nursing facility in the past.Psychiatry was consulted to assess capacity. Seen by  them. They have determined that the patient does not have  capacity.   Discharge Exam:   Blood pressure 102/76, pulse 74, temperature 98.6 F (37 C), temperature source Oral, resp. rate 16, height 5' 9"  (1.753 m), weight 48 kg (105 lb 12.8 oz), SpO2 99 %.   General appearance: alert, distracted and no distress Resp: clear to auscultation bilaterally. Coarse breath sounds are noted. No wheezing, crackles or rhonchi. Cardio: regular rate and rhythm, S1, S2 normal, no murmur, click, rub or gallop GI: soft, non-tender; bowel sounds normal; no masses,  no organomegaly Extremities: extremities normal, atraumatic, no cyanosis or edema Neurologic: Awake and alert. Somewhat distracted. No obvious facial asymmetry. Moving all his extremities   Follow-up Information    [name], MD. Call.   Specialty:  Family Medicine Why:  hospital follow ,  Contact information: [address] [place] [address] [phone]           Signed: Reyne Dumas 12/09/2016, 12:12 PM        Time spent >45 mins

## 2016-12-09 NOTE — Progress Notes (Signed)
Late entry Patient due to discharge today however, began feeling a "tightness" in his chest, requiring 2 PRN breathing treatments during shift and 2L of 02 required. MD made aware, chest x-ray was ordered. Patients sats 100% on room air at rest, but drops to 86% with ambulation.

## 2016-12-09 NOTE — Consult Note (Signed)
   [facility] Inpatient Consult   12/09/2016  Anthony Bolton 1951-09-26 [id number]     Patient screened for potential [facility] Care Management services. Chart reviewed. Noted current discharge plan is for  SNF. Spoke with inpatient RNCM to confirm.  There are no identifiable [facility] Care Management needs at this time. If patient's post hospital needs change, please place a [facility] Care Management consult. For questions please contact:  Marthenia Rolling, [place], RN,BSN [facility] Liaison [phone]

## 2016-12-09 NOTE — Progress Notes (Signed)
PT Cancellation Note  Patient Details Name: Anthony Bolton MRN: [id number] DOB: 10/27/1950   Cancelled Treatment:    Reason Eval/Treat Not Completed: Patient at procedure or test/unavailable;Medical issues which prohibited therapy.  Initially was SOB wanting a nebulizer, then going to CT for chest.  Will see as able 3/15. 12/09/2016  Donnella Sham, [name] [phone]  (pager)   [name] 12/09/2016, 3:17 PM

## 2016-12-10 DIAGNOSIS — R031 Nonspecific low blood-pressure reading: Secondary | ICD-10-CM | POA: Diagnosis not present

## 2016-12-10 DIAGNOSIS — I2510 Atherosclerotic heart disease of native coronary artery without angina pectoris: Secondary | ICD-10-CM | POA: Diagnosis not present

## 2016-12-10 DIAGNOSIS — Z720 Tobacco use: Secondary | ICD-10-CM | POA: Diagnosis not present

## 2016-12-10 DIAGNOSIS — I429 Cardiomyopathy, unspecified: Secondary | ICD-10-CM | POA: Diagnosis not present

## 2016-12-10 DIAGNOSIS — T391X1A Poisoning by 4-Aminophenol derivatives, accidental (unintentional), initial encounter: Secondary | ICD-10-CM | POA: Diagnosis not present

## 2016-12-10 DIAGNOSIS — I10 Essential (primary) hypertension: Secondary | ICD-10-CM | POA: Diagnosis not present

## 2016-12-10 DIAGNOSIS — R42 Dizziness and giddiness: Secondary | ICD-10-CM | POA: Diagnosis not present

## 2016-12-10 DIAGNOSIS — R7989 Other specified abnormal findings of blood chemistry: Secondary | ICD-10-CM | POA: Diagnosis not present

## 2016-12-10 DIAGNOSIS — K7290 Hepatic failure, unspecified without coma: Secondary | ICD-10-CM | POA: Diagnosis not present

## 2016-12-10 DIAGNOSIS — M6281 Muscle weakness (generalized): Secondary | ICD-10-CM | POA: Diagnosis not present

## 2016-12-10 DIAGNOSIS — T391X1D Poisoning by 4-Aminophenol derivatives, accidental (unintentional), subsequent encounter: Secondary | ICD-10-CM | POA: Diagnosis not present

## 2016-12-10 DIAGNOSIS — K7200 Acute and subacute hepatic failure without coma: Secondary | ICD-10-CM | POA: Diagnosis not present

## 2016-12-10 DIAGNOSIS — R262 Difficulty in walking, not elsewhere classified: Secondary | ICD-10-CM | POA: Diagnosis not present

## 2016-12-10 DIAGNOSIS — R4182 Altered mental status, unspecified: Secondary | ICD-10-CM | POA: Diagnosis not present

## 2016-12-10 DIAGNOSIS — D649 Anemia, unspecified: Secondary | ICD-10-CM | POA: Diagnosis not present

## 2016-12-10 DIAGNOSIS — T391X4A Poisoning by 4-Aminophenol derivatives, undetermined, initial encounter: Secondary | ICD-10-CM | POA: Diagnosis not present

## 2016-12-10 DIAGNOSIS — H9190 Unspecified hearing loss, unspecified ear: Secondary | ICD-10-CM | POA: Diagnosis not present

## 2016-12-10 DIAGNOSIS — T50901A Poisoning by unspecified drugs, medicaments and biological substances, accidental (unintentional), initial encounter: Secondary | ICD-10-CM | POA: Diagnosis not present

## 2016-12-10 DIAGNOSIS — R946 Abnormal results of thyroid function studies: Secondary | ICD-10-CM | POA: Diagnosis not present

## 2016-12-10 DIAGNOSIS — J449 Chronic obstructive pulmonary disease, unspecified: Secondary | ICD-10-CM | POA: Diagnosis not present

## 2016-12-10 MED ORDER — POLYETHYLENE GLYCOL 3350 17 G PO PACK: 17.0000 g | PACK | Freq: Every day | ORAL | 0 refills | Status: DC

## 2016-12-10 MED ORDER — PREDNISONE 5 MG PO TABS: ORAL_TABLET | ORAL | 0 refills | Status: DC

## 2016-12-10 MED ORDER — MOMETASONE FURO-FORMOTEROL FUM 100-5 MCG/ACT IN AERO: 2.0000 | INHALATION_SPRAY | Freq: Two times a day (BID) | RESPIRATORY_TRACT | 0 refills | Status: DC

## 2016-12-10 NOTE — Progress Notes (Signed)
Patient discharged to [facility], report called to Levada Dy, RN. IV and tele were discontinued. Attempted to review AVS with patient, who screamed "it's not going to work" attempted to contact family members listed in chart, left contact number on voicemail.  AVS and discharge envelope will be sent with transport, so facility can give it to family.  Patient on 2L of O2, v/s stable, no complaints Transport called

## 2016-12-10 NOTE — Progress Notes (Signed)
Physical Therapy Treatment Patient Details Name: ALOYS HUPFER MRN: [id number] DOB: [birth year] Today's Date: 12/10/2016    History of Present Illness Pt is a 66 y/o male admitted secondary to chest pain and SOB, found to have acetaminophen toxicity. PMH including but not limited to COPD and CHF.    PT Comments    Pt more participative or less resistive than last treatment.  On 3 L [place] pt's sats stayed in low to mid 90's level, ambulating with RW   Follow Up Recommendations  SNF     Equipment Recommendations  None recommended by PT    Recommendations for Other Services       Precautions / Restrictions Precautions Precautions: Fall Restrictions Weight Bearing Restrictions: No    Mobility  Bed Mobility Overal bed mobility: Modified Independent                Transfers Overall transfer level: Needs assistance Equipment used: Rolling walker (2 wheeled)   Sit to Stand: Min assist         General transfer comment: for stability  Ambulation/Gait Ambulation/Gait assistance: Min assist Ambulation Distance (Feet): 120 Feet Assistive device: Rolling walker (2 wheeled) Gait Pattern/deviations: Step-through pattern Gait velocity: slower Gait velocity interpretation: Below normal speed for age/gender General Gait Details: cues to use RW more safely and maneuver the RW more safely.  Wide BOS, staying too far behine the RW  Sats on 3L were at low to mid 90's and EHR in the low 90's   Stairs            Wheelchair Mobility    Modified Rankin (Stroke Patients Only)       Balance Overall balance assessment: Needs assistance Sitting-balance support: Feet supported Sitting balance-Leahy Scale: Fair     Standing balance support: During functional activity;Bilateral upper extremity supported Standing balance-Leahy Scale: Poor Standing balance comment: pt reliant on the RW                    Cognition Arousal/Alertness: Awake/alert Behavior  During Therapy: WFL for tasks assessed/performed Overall Cognitive Status: Difficult to assess                      Exercises      General Comments        Pertinent Vitals/Pain Pain Assessment: No/denies pain    Home Living                      Prior Function            PT Goals (current goals can now be found in the care plan section) Acute Rehab PT Goals PT Goal Formulation: With patient Time For Goal Achievement: 12/19/16 Potential to Achieve Goals: Fair    Frequency    Min 3X/week      PT Plan Current plan remains appropriate    Co-evaluation             End of Session Equipment Utilized During Treatment: Oxygen Activity Tolerance: Patient limited by fatigue Patient left: in bed;with call bell/phone within reach;with bed alarm set   PT Visit Diagnosis: Unsteadiness on feet (R26.81);Other abnormalities of gait and mobility (R26.89)     Time: 3267-1245 PT Time Calculation (min) (ACUTE ONLY): 15 min  Charges:  $Gait Training: 8-22 mins                    G Codes:       Chrissie Noa  V Trisha Ken 12/10/2016, 12:50 PM 12/10/2016  Donnella Sham, PT [phone]  (pager)

## 2016-12-10 NOTE — Clinical Social Work Placement (Signed)
   CLINICAL SOCIAL WORK PLACEMENT  NOTE 12/10/16 - DISCHARGED TO [place] VIA AMBULANCE  Date:  12/10/2016  Patient Details  Name: Anthony Bolton MRN: [id number] Date of Birth: 02/24/1951  Clinical Social Work is seeking post-discharge placement for this patient at the [place] level of care (*CSW will initial, date and re-position this form in  chart as items are completed):  Yes ([facility] emailed SNF list)   Patient/family provided with [facility] Work Department's list of facilities offering this level of care within the geographic area requested by the patient (or if unable, by the patient's family).  Yes   Patient/family informed of their freedom to choose among providers that offer the needed level of care, that participate in Medicare, Medicaid or managed care program needed by the patient, have an available bed and are willing to accept the patient.  Yes   Patient/family informed of [facility]'s ownership interest in [facility] and [facility], as well as of the fact that they are under no obligation to receive care at these facilities.  PASRR submitted to EDS on 12/08/16     PASRR number received on 12/08/16     Existing PASRR number confirmed on       FL2 transmitted to all facilities in geographic area requested by pt/family on 12/08/16     FL2 transmitted to all facilities within larger geographic area on       Patient informed that his/her managed care company has contracts with or will negotiate with certain facilities, including the following:        Yes (Son Dell[facility] Filbert provided with facility responses)   Patient/family informed of bed offers received.  Patient chooses bed at  [facility]     Physician recommends and patient chooses bed at      Patient to be transferred to  [facility] on  12/10/16.  Patient to be transferred to facility by  ambulance     Patient family notified on   12/10/16 of transfer.  Name of family member notified:    Clovia Cuff contacted [phone]).     PHYSICIAN       Additional Comment:    _______________________________________________ Sable Feil, LCSW 12/10/2016, 3:30 PM

## 2016-12-10 NOTE — Discharge Summary (Addendum)
Physician Discharge Summary  DHRUVA ORNDOFF MRN: [id number] DOB/AGE: [birth year] 66 y.o.  PCP: [name], MD   Admit date: 12/03/2016 Discharge date: 12/10/2016  Discharge Diagnoses:    Principal Problem:   Acetaminophen toxicity Active Problems:   COPD (chronic obstructive pulmonary disease) (HCC)   Tobacco abuse   Hard of hearing   CAD (nonobstructive per cath 2015)   HTN (hypertension)   ALF (acute liver failure)   Acetaminophen overdose of undetermined intent   Elevated troponin   Abnormal thyroid function test   Normocytic anemia    Follow-up recommendations Follow-up with PCP in 3-5 days , including all  additional recommended appointments as below Closely monitor CBC, CMP, PT/INR  Fall precaution Repeat thyroid function tests in 4-6 weeks  Dysphagia 3 (Mech soft);Thin liquid   Liquid Administration via: Cup Medication Administration: Whole meds with liquid Supervision: Patient able to self feed Compensations: Minimize environmental distractions;Slow rate;Small sips/bites Postural Changes: Seated upright at 90 degrees   Patient lacks capacity to make his own medical decisions Stable large LEFT adrenal benign adenoma , needs outpatient follow-up by PCP  He will need 2 L of oxygen continuous for his COPD    Current Discharge Medication List    START taking these medications   Details  feeding supplement, ENSURE ENLIVE, (ENSURE ENLIVE) LIQD Take 237 mLs by mouth 3 (three) times daily between meals. Qty: 237 mL, Refills: 12    metoprolol tartrate (LOPRESSOR) 25 MG tablet Take 0.5 tablets (12.5 mg total) by mouth 2 (two) times daily. Qty: 60 tablet, Refills: 0    mometasone-formoterol (DULERA) 100-5 MCG/ACT AERO Inhale 2 puffs into the lungs 2 (two) times daily at 10 AM and 5 PM. Qty: 1 Inhaler, Refills: 0    nicotine (NICODERM CQ - DOSED IN MG/24 HOURS) 14 mg/24hr patch Place 1 patch (14 mg total) onto the skin daily. Qty: 28 patch, Refills: 0     pantoprazole (PROTONIX) 40 MG tablet Take 1 tablet (40 mg total) by mouth daily. Qty: 30 tablet, Refills: 1    polyethylene glycol (MIRALAX / GLYCOLAX) packet Take 17 g by mouth daily. Qty: 14 each, Refills: 0    predniSONE (DELTASONE) 5 MG tablet 8 tablets for 2 days, 7 tablets for 2 days, 6 tablets for 2 days, 5 tablets for 2 days, 4 tablets for 2 days, 3 tablets for 2 days, 2 tablets for 2 days, 1 tablet for 2 days then discontinue Qty: 100 tablet, Refills: 0      CONTINUE these medications which have NOT CHANGED   Details  albuterol (PROVENTIL HFA;VENTOLIN HFA) 108 (90 Base) MCG/ACT inhaler Inhale 2 puffs into the lungs every 4 (four) hours as needed for wheezing or shortness of breath. Qty: 1 Inhaler, Refills: 0    aspirin EC 81 MG tablet Take 1 tablet (81 mg total) by mouth daily. Qty: 30 tablet, Refills: 0    ipratropium-albuterol (DUONEB) 0.5-2.5 (3) MG/3ML SOLN Take 3 mLs by nebulization every 6 (six) hours as needed (wheezing, shortness of breath). Qty: 360 mL, Refills: 0    tiotropium (SPIRIVA) 18 MCG inhalation capsule Place 1 capsule (18 mcg total) into inhaler and inhale daily. Qty: 30 capsule, Refills: 0      STOP taking these medications     budesonide-formoterol (SYMBICORT) 160-4.5 MCG/ACT inhaler          Discharge Condition: *Stable   Discharge Instructions Get Medicines reviewed and adjusted: Please take all your medications with you for your next visit with  your Primary MD  Please request your Primary MD to go over all hospital tests and procedure/radiological results at the follow up, please ask your Primary MD to get all Hospital records sent to his/her office.  If you experience worsening of your admission symptoms, develop shortness of breath, life threatening emergency, suicidal or homicidal thoughts you must seek medical attention immediately by calling 911 or calling your MD immediately if symptoms less severe.  You must read complete  instructions/literature along with all the possible adverse reactions/side effects for all the Medicines you take and that have been prescribed to you. Take any new Medicines after you have completely understood and accpet all the possible adverse reactions/side effects.   Do not drive when taking Pain medications.   Do not take more than prescribed Pain, Sleep and Anxiety Medications  Special Instructions: If you have smoked or chewed Tobacco in the last 2 yrs please stop smoking, stop any regular Alcohol and or any Recreational drug use.  Wear Seat belts while driving.  Please note  You were cared for by a hospitalist during your hospital stay. Once you are discharged, your primary care physician will handle any further medical issues. Please note that NO REFILLS for any discharge medications will be authorized once you are discharged, as it is imperative that you return to your primary care physician (or establish a relationship with a primary care physician if you do not have one) for your aftercare needs so that they can reassess your need for medications and monitor your lab values.  Discharge Instructions    Diet - low sodium heart healthy    Complete by:  As directed    Increase activity slowly    Complete by:  As directed        No Known Allergies    Disposition: 01-Home or Self Care   Consults:  Gastroenterology    Significant Diagnostic Studies:  Dg Chest 2 View  Result Date: 12/09/2016 CLINICAL DATA:  Shortness of breath. EXAM: CHEST  2 VIEW COMPARISON:  CT 12/03/2016 . FINDINGS: Mediastinum hilar structures normal. Lungs are clear. COPD cannot be excluded . Mild left base pleural thickening noted consistent with scarring. Stable elevation left hemidiaphragm. Heart size normal. IMPRESSION: 1. COPD. Mild left base pleural thickening consistent with scarring. 2. No acute cardiopulmonary disease. Electronically Signed   By: Marcello Moores  Register   On: 12/09/2016 15:25    Dg Chest 2 View  Result Date: 12/03/2016 CLINICAL DATA:  Chest pain EXAM: CHEST  2 VIEW COMPARISON:  10/22/2016, 10/13/2016 FINDINGS: The lungs are hyperinflated and there is bullous emphysematous disease within the upper lobes. There is mild left pleural scarring. There is no acute infiltrate. Stable cardiomediastinal silhouette. No pneumothorax. IMPRESSION: 1. Hyperinflation with bullous emphysematous disease bilaterally. 2. No acute infiltrate Electronically Signed   By: Donavan Foil M.D.   On: 12/03/2016 19:01   Ct Angio Chest Pe W And/or Wo Contrast  Result Date: 12/03/2016 CLINICAL DATA:  Shortness of breath. Chest pain for 1 day, dysuria for 2 days. History of COPD. EXAM: CT ANGIOGRAPHY CHEST WITH CONTRAST TECHNIQUE: Multidetector CT imaging of the chest was performed using the standard protocol during bolus administration of intravenous contrast. Multiplanar CT image reconstructions and MIPs were obtained to evaluate the vascular anatomy. CONTRAST:  100 cc Isovue 370 COMPARISON:  Chest radiograph December 03, 2016 1848 hours and CT chest January 08, 2016 FINDINGS: Mild respiratory motion degraded examination. CARDIOVASCULAR: Adequate contrast opacification of the pulmonary artery's. Main pulmonary  artery is not enlarged. No pulmonary arterial filling defects to the level of the subsegmental branches. Respiratory motion results in heterogeneous density RIGHT lower lobe subsegmental pulmonary arteries. Heart size is normal, no right heart strain. No pericardial effusion. Thoracic aorta is normal course and caliber, LEFT vertebral artery arises directly from the op aortic arch, normal variant. Trace calcific atherosclerosis of the aortic arch. MEDIASTINUM/NODES: No lymphadenopathy by CT size criteria. LUNGS/PLEURA: Tracheobronchial tree is patent, no pneumothorax. Mild chronic bronchial wall thickening. Severe centrilobular emphysema. No pleural effusion or focal consolidation. Hyperventilation. UPPER  ABDOMEN: Included view of the abdomen is nonacute. Stable 3.9 cm benign LEFT adrenal adenoma (7 Hounsfield units). MUSCULOSKELETAL: Visualized soft tissues and included osseous structures are nonacute. Old posterior LEFT rib fractures. Respiratory motion at multiple levels results in spurious appearance of rib fractures. Review of the MIP images confirms the above findings. IMPRESSION: No acute pulmonary embolism. Stable emphysema and chronic bronchitis. Stable large LEFT adrenal benign adenoma. Electronically Signed   By: Elon Alas M.D.   On: 12/03/2016 21:58   US Abdomen Limited Ruq  Result Date: 12/04/2016 CLINICAL DATA:  Initial evaluation for elevated liver enzymes, bilirubin. Question hepatitis. EXAM: US ABDOMEN LIMITED - RIGHT UPPER QUADRANT COMPARISON:  Prior radiograph from 10/24/2016. FINDINGS: Gallbladder: No gallstones or wall thickening visualized. No sonographic Murphy sign noted by sonographer. Common bile duct: Diameter: 4.2 mm Liver: No focal lesion identified. Within normal limits in parenchymal echogenicity. IMPRESSION: Normal right upper quadrant ultrasound. No sonographic evidence for acute abnormality identified. Electronically Signed   By: Jeannine Boga M.D.   On: 12/04/2016 00:14        Filed Weights   12/07/16 2150 12/08/16 2237 12/09/16 2128  Weight: 47.4 kg (104 lb 8 oz) 48 kg (105 lb 12.8 oz) 45.9 kg (101 lb 3.2 oz)     Microbiology: Recent Results (from the past 240 hour(s))  Blood Culture (routine x 2)     Status: None   Collection Time: 12/03/16  7:25 PM  Result Value Ref Range Status   Specimen Description BLOOD LEFT HAND  Final   Special Requests BOTTLES DRAWN AEROBIC AND ANAEROBIC 5CC EACH  Final   Culture   Final    NO GROWTH 5 DAYS Performed at [facility] [address] [place], [place] [address]    Report Status 12/08/2016 FINAL  Final  Blood Culture (routine x 2)     Status: None   Collection Time: 12/03/16  7:50 PM  Result  Value Ref Range Status   Specimen Description BLOOD BLOOD RIGHT FOREARM  Final   Special Requests BOTTLES DRAWN AEROBIC AND ANAEROBIC 5CC EACH  Final   Culture   Final    NO GROWTH 5 DAYS Performed at [facility], [place] [address] [place], [place] [address]    Report Status 12/08/2016 FINAL  Final  MRSA PCR Screening     Status: None   Collection Time: 12/04/16  3:27 AM  Result Value Ref Range Status   MRSA by PCR NEGATIVE NEGATIVE Final    Comment:        The GeneXpert MRSA Assay (FDA approved for NASAL specimens only), is one component of a comprehensive MRSA colonization surveillance program. It is not intended to diagnose MRSA infection nor to guide or monitor treatment for MRSA infections.        Blood Culture    Component Value Date/Time   SDES BLOOD BLOOD RIGHT FOREARM 12/03/2016 1950   SPECREQUEST BOTTLES  DRAWN AEROBIC AND ANAEROBIC 5CC EACH 12/03/2016 1950   CULT  12/03/2016 1950    NO GROWTH 5 DAYS Performed at [facility], [place] [address], [place], [place] [address]    REPTSTATUS 12/08/2016 FINAL 12/03/2016 1950      Labs: Results for orders placed or performed during the hospital encounter of 12/03/16 (from the past 48 hour(s))  CBC     Status: Abnormal   Collection Time: 12/08/16 12:53 PM  Result Value Ref Range   WBC 11.2 (H) 4.0 - 10.5 K/uL   RBC 3.12 (L) 4.22 - 5.81 MIL/uL   Hemoglobin 9.1 (L) 13.0 - 17.0 g/dL   HCT 27.6 (L) 39.0 - 52.0 %   MCV 88.5 78.0 - 100.0 fL   MCH 29.2 26.0 - 34.0 pg   MCHC 33.0 30.0 - 36.0 g/dL   RDW 14.0 11.5 - 15.5 %   Platelets 57 (L) 150 - 400 K/uL    Comment: CONSISTENT WITH PREVIOUS RESULT  Comprehensive metabolic panel     Status: Abnormal   Collection Time: 12/08/16 12:53 PM  Result Value Ref Range   Sodium 136 135 - 145 mmol/L   Potassium 3.5 3.5 - 5.1 mmol/L   Chloride 103 101 - 111 mmol/L   CO2 24 22 - 32 mmol/L   Glucose, Bld 98 65 - 99 mg/dL   BUN 13 6 - 20 mg/dL   Creatinine, Ser 0.36  (L) 0.61 - 1.24 mg/dL   Calcium 8.0 (L) 8.9 - 10.3 mg/dL   Total Protein 4.8 (L) 6.5 - 8.1 g/dL   Albumin 2.4 (L) 3.5 - 5.0 g/dL   AST 56 (H) 15 - 41 U/L   ALT 830 (H) 17 - 63 U/L   Alkaline Phosphatase 109 38 - 126 U/L   Total Bilirubin 0.9 0.3 - 1.2 mg/dL   GFR calc non Af Amer >60 >60 mL/min   GFR calc Af Amer >60 >60 mL/min    Comment: (NOTE) The eGFR has been calculated using the CKD EPI equation. This calculation has not been validated in all clinical situations. eGFR's persistently <60 mL/min signify possible Chronic Kidney Disease.    Anion gap 9 5 - 15  CBC     Status: Abnormal   Collection Time: 12/09/16  5:50 AM  Result Value Ref Range   WBC 10.4 4.0 - 10.5 K/uL   RBC 3.21 (L) 4.22 - 5.81 MIL/uL   Hemoglobin 9.5 (L) 13.0 - 17.0 g/dL    Comment: CONSISTENT WITH PREVIOUS RESULT   HCT 28.4 (L) 39.0 - 52.0 %   MCV 88.5 78.0 - 100.0 fL   MCH 29.6 26.0 - 34.0 pg   MCHC 33.5 30.0 - 36.0 g/dL   RDW 14.1 11.5 - 15.5 %   Platelets 59 (L) 150 - 400 K/uL    Comment: CONSISTENT WITH PREVIOUS RESULT  Comprehensive metabolic panel     Status: Abnormal   Collection Time: 12/09/16  5:50 AM  Result Value Ref Range   Sodium 137 135 - 145 mmol/L   Potassium 3.5 3.5 - 5.1 mmol/L   Chloride 103 101 - 111 mmol/L   CO2 25 22 - 32 mmol/L   Glucose, Bld 93 65 - 99 mg/dL   BUN 11 6 - 20 mg/dL   Creatinine, Ser 0.37 (L) 0.61 - 1.24 mg/dL   Calcium 8.1 (L) 8.9 - 10.3 mg/dL   Total Protein 4.9 (L) 6.5 - 8.1 g/dL   Albumin 2.5 (L) 3.5 - 5.0  g/dL   AST 40 15 - 41 U/L   ALT 633 (H) 17 - 63 U/L   Alkaline Phosphatase 100 38 - 126 U/L   Total Bilirubin 0.9 0.3 - 1.2 mg/dL   GFR calc non Af Amer >60 >60 mL/min   GFR calc Af Amer >60 >60 mL/min    Comment: (NOTE) The eGFR has been calculated using the CKD EPI equation. This calculation has not been validated in all clinical situations. eGFR's persistently <60 mL/min signify possible Chronic Kidney Disease.    Anion gap 9 5 - 15      Lipid Panel     Component Value Date/Time   CHOL 219 (H) 10/22/2016 0013   TRIG 101 10/22/2016 0013   HDL 70 10/22/2016 0013   CHOLHDL 3.1 10/22/2016 0013   VLDL 20 10/22/2016 0013   LDLCALC 129 (H) 10/22/2016 0013     Lab Results  Component Value Date   HGBA1C 5.6 08/12/2015   HGBA1C 5.8 (H) 08/11/2015     Lab Results  Component Value Date   LDLCALC 129 (H) 10/22/2016   CREATININE 0.37 (L) 12/09/2016     HPI :  66 y.o.gentleman with a history of nonobstructing CAD, COPD (he is not on home oxygen), active tobacco use with dependence, prior CHF (LV systolic function normal on last echo in 2016), and hard of hearing who called EMS for evaluation of chest pain and shortness of breath. The patient is a poor historian. Patient was experiencing migraine headaches and took unclear amount of acetaminophen, NSAIDs and aspirin. No clear reason was found for his chest pain or shortness of breath. However, he was found to have elevated liver function tests. He also had elevated Tylenol level. He was admitted for further management of Tylenol toxicity  HOSPITAL COURSE:   Acetaminophen toxicity , acute liver injury This appears to be an unintentional overdose while he was treating his migraine headache earlier this week. Acetaminophen levels were elevated. Patient was started on intravenous N-acetylcysteine. Poison control was contacted. Patient's acetaminophen levels have become undetectable. LFTs are trending down. INR has improved. N-acetylcysteine was discontinued after approximately 24 hours per poison control. Ammonia level was normal. Right upper quadrant ultrasound did not show any acute findings. Hepatitis panel is negative. HIV is nonreactive. Continue to monitor liver function tests closely AST 40, ALT 633, total bilirubin 0.9 prior to discharge  Nonanion gap metabolic acidosis Possibly due to the above. Much improved.   Dehydration S/P aggressive hydration in the ED.  Hydration status has improved.   Concern for sepsis with lactic acidosis in the ED, sepsis ruled out   Elevated lactic acid level, likely due to liver injury. Lactate became normal. Patient was empirically started on broad-spectrum antibiotics. Patient is afebrile. Has a normal WBC. No foci of infection is noted. Blood cultures are negative so far. Antibiotics were discontinued. He remains afebrile. WBC remains normal.  Elevated troponin/history of nonobstructive CAD/history of nonischemic cardiomyopathy Troponins were only minimally elevated without any significant rise. EKG was of poor quality. Repeat EKG did not show ischemic changes. Patient denies any chest pain currently. Echocardiogram is as above. Diminished ejection fraction as noted. Old records reviewed. Patient was noted to have nonischemic cardiomyopathy back in 2015. He underwent a cardiac catheterization at that time which did not reveal any coronary artery disease. He was placed on ACE inhibitor and beta blocker. His ejection fraction actually improved. Patient likely has not been compliant. Placed back on beta blocker. Heart rate has improved.  No further cardiac workup at this time.  Outpatient follow-up with Dr. Terrence Dupont. Patient is currently euvolemic.  Abnormal thyroid function tests TSH is 0.09. Free T4 mildly elevated at 1.17. Patient does not have any other signs or symptoms of hyperthyroidism except for sinus tachycardia. He was started on metoprolol. Would recommend that these thyroid function test repeated in the outpatient setting in 4-6 weeks.     Thrombocytopenia Platelet counts remain low. No evidence for bleeding. He is not on any heparin products. Etiology is unclear. I-94 and folic acid levels are normal. Downward trend could be due to acute illness and might improve eventually. Continue to trend. Labs not done today.    Shortness of breath/minus COPD exacerbation CT scan 12/03/16 without any evidence of pulmonary  embolism, stable emphysema and chronic bronchitis, no pneumonia We will continue Dulera, Spiriva, low-dose prednisone with taper Suspect patient will need oxygen therapy long-term, placed on 2 L continuous  Active tobacco use Patient has no plans to stop smoking. Nicotine patch.  Normocytic Anemia No evidence for bleeding. Anemia panel reviewed. No clear-cut deficiency is identified. Hemoglobin stable.   Social situation Patient apparently lives in a motel. When he was picked up by EMS he was found to be living in squalid conditions. There was feces and urine around the room. Social worker has been consulted. Patient will benefit from going to skilled nursing facility for short-term rehabilitation to begin with. Looks like his son wants the same. Patient apparently has refused placement to skilled nursing facility in the past.Psychiatry was consulted to assess capacity. Seen by them. They have determined that the patient does not have  capacity.   Discharge Exam:   Blood pressure 92/62, pulse 83, temperature 99.3 F (37.4 C), temperature source Oral, resp. rate 16, height 5' 9"  (1.753 m), weight 45.9 kg (101 lb 3.2 oz), SpO2 99 %.   General appearance: alert, distracted and no distress Resp: clear to auscultation bilaterally. Coarse breath sounds are noted. No wheezing, crackles or rhonchi. Cardio: regular rate and rhythm, S1, S2 normal, no murmur, click, rub or gallop GI: soft, non-tender; bowel sounds normal; no masses,  no organomegaly Extremities: extremities normal, atraumatic, no cyanosis or edema Neurologic: Awake and alert. Somewhat distracted. No obvious facial asymmetry. Moving all his extremities    Contact information for follow-up providers    Call [name], MD.   Specialty:  Family Medicine Why:  Please contact office for a hospital follow up appointment. Thank you.  Contact information: [address] [place] [address] [phone]             Contact information for after-discharge care    [facility] .   Specialty:  [place] information: [address] [place] [phone]                  Signed: Reyne Dumas 12/10/2016, 8:33 AM        Time spent >45 mins

## 2016-12-11 DIAGNOSIS — J449 Chronic obstructive pulmonary disease, unspecified: Secondary | ICD-10-CM | POA: Diagnosis not present

## 2016-12-11 DIAGNOSIS — I429 Cardiomyopathy, unspecified: Secondary | ICD-10-CM | POA: Diagnosis not present

## 2016-12-11 DIAGNOSIS — I2510 Atherosclerotic heart disease of native coronary artery without angina pectoris: Secondary | ICD-10-CM | POA: Diagnosis not present

## 2016-12-11 DIAGNOSIS — T391X1D Poisoning by 4-Aminophenol derivatives, accidental (unintentional), subsequent encounter: Secondary | ICD-10-CM | POA: Diagnosis not present

## 2016-12-17 DIAGNOSIS — R42 Dizziness and giddiness: Secondary | ICD-10-CM | POA: Diagnosis not present

## 2016-12-17 DIAGNOSIS — I10 Essential (primary) hypertension: Secondary | ICD-10-CM | POA: Diagnosis not present

## 2016-12-17 DIAGNOSIS — R031 Nonspecific low blood-pressure reading: Secondary | ICD-10-CM | POA: Diagnosis not present

## 2016-12-31 ENCOUNTER — Other Ambulatory Visit: Payer: Self-pay

## 2016-12-31 NOTE — Patient Outreach (Signed)
[facility]) Care Management  12/31/2016  NAVARRO NINE December 05, 1950 [id number]     Transition of Care Referral  Referral Date: 12/29/16 Referral Source: [facility] Date of Discharge: 12/30/16 Facility: [facility]: Medicare   Outreach attempt # 1 to patient. No answer at present. RN CM left HIPAA compliant voicemail message along with contact info.      Plan: RN CM will make outreach attempt to patient within three business days.   Enzo Montgomery, RN,BSN,CCM [facility] Telephonic Care Management Coordinator Direct Phone: [phone] Toll Free: [phone] Fax: [phone]

## 2017-01-01 DIAGNOSIS — D696 Thrombocytopenia, unspecified: Secondary | ICD-10-CM | POA: Diagnosis not present

## 2017-01-01 DIAGNOSIS — I2510 Atherosclerotic heart disease of native coronary artery without angina pectoris: Secondary | ICD-10-CM | POA: Diagnosis not present

## 2017-01-01 DIAGNOSIS — D649 Anemia, unspecified: Secondary | ICD-10-CM | POA: Diagnosis not present

## 2017-01-01 DIAGNOSIS — I509 Heart failure, unspecified: Secondary | ICD-10-CM | POA: Diagnosis not present

## 2017-01-01 DIAGNOSIS — I110 Hypertensive heart disease with heart failure: Secondary | ICD-10-CM | POA: Diagnosis not present

## 2017-01-01 DIAGNOSIS — J449 Chronic obstructive pulmonary disease, unspecified: Secondary | ICD-10-CM | POA: Diagnosis not present

## 2017-01-04 ENCOUNTER — Encounter: Payer: Self-pay | Admitting: *Deleted

## 2017-01-04 ENCOUNTER — Other Ambulatory Visit: Payer: Self-pay
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
## 2017-01-05 DIAGNOSIS — D649 Anemia, unspecified: Secondary | ICD-10-CM | POA: Diagnosis not present

## 2017-01-05 DIAGNOSIS — I110 Hypertensive heart disease with heart failure: Secondary | ICD-10-CM | POA: Diagnosis not present

## 2017-01-05 DIAGNOSIS — I2510 Atherosclerotic heart disease of native coronary artery without angina pectoris: Secondary | ICD-10-CM | POA: Diagnosis not present

## 2017-01-05 DIAGNOSIS — J449 Chronic obstructive pulmonary disease, unspecified: Secondary | ICD-10-CM | POA: Diagnosis not present

## 2017-01-05 DIAGNOSIS — D696 Thrombocytopenia, unspecified: Secondary | ICD-10-CM | POA: Diagnosis not present

## 2017-01-05 DIAGNOSIS — I509 Heart failure, unspecified: Secondary | ICD-10-CM | POA: Diagnosis not present

## 2017-01-06 ENCOUNTER — Other Ambulatory Visit: Payer: Self-pay | Admitting: Pharmacist

## 2017-01-06 ENCOUNTER — Other Ambulatory Visit: Payer: Self-pay | Admitting: *Deleted

## 2017-01-06 NOTE — Patient Outreach (Signed)
[facility]) Care Management  01/06/2017  Anthony Bolton October 25, 1950 [id number]   Patient had been referred to [place] by [facility] RN Telephonic for assistance with medication management.  Received in basket message from [place] whom contacted patient, that patient wants to be left alone and not share his business.   Plan:  Will not open pharmacy case and will not contact patient per patient telling LCSW he wishes to be left alone.   Karrie Meres, PharmD, [place] [phone]

## 2017-01-06 NOTE — Patient Outreach (Signed)
[facility]) Care Management  01/06/2017  Anthony Bolton 1950-11-19 [id number]  CSW was able to make brief contact with patient today to introduce self and explain role.  Patient became quite agitated with CSW, reporting that he is not interested in services, simply wanting to be left alone.  Patient further reported, "I am a private person, no one needs to know my business".  Patient was not the least bit receptive to hearing about the services CSW had to offer, nor was patient willing to meet with CSW for a home visit.  Patient reported, "I have my doors padlocked and I don't intend to let anyone in".  Patient ranted for a few more minutes before finally terminating the call.  CSW will refrain from opening patient's case at this time, per patient's request. Nat Christen, BSW, MSW, LCSW  Licensed Clinical Social Worker  [facility]  Mailing [place]. [address] [place], [place] [address] Physical Address-[address], [place], [place] [address] Toll Free Main # [phone] Fax # [phone] Cell # [phone]  Office # [phone] [email or web address]

## 2017-01-08 ENCOUNTER — Other Ambulatory Visit: Payer: Self-pay

## 2017-01-08 DIAGNOSIS — I509 Heart failure, unspecified: Secondary | ICD-10-CM | POA: Diagnosis not present

## 2017-01-08 DIAGNOSIS — I2510 Atherosclerotic heart disease of native coronary artery without angina pectoris: Secondary | ICD-10-CM | POA: Diagnosis not present

## 2017-01-08 DIAGNOSIS — D696 Thrombocytopenia, unspecified: Secondary | ICD-10-CM | POA: Diagnosis not present

## 2017-01-08 DIAGNOSIS — I110 Hypertensive heart disease with heart failure: Secondary | ICD-10-CM | POA: Diagnosis not present

## 2017-01-08 DIAGNOSIS — J449 Chronic obstructive pulmonary disease, unspecified: Secondary | ICD-10-CM | POA: Diagnosis not present

## 2017-01-08 DIAGNOSIS — D649 Anemia, unspecified: Secondary | ICD-10-CM | POA: Diagnosis not present

## 2017-01-08 NOTE — Patient Outreach (Signed)
[facility]) Care Management  01/08/17  Anthony Bolton [birth year] [id number]  SW and Pharmacy have closed episodes due to patient's adamant refusal when SW made outreach to patient. Per review, it does not appear that anyone contacted the patient's son. Patient is reportedly very hard of hearing on the phone and RNCM is concerned he may not have understood what the purpose of [facility] outreach call.   Successful outreach completed with patient's son, Anthony Bolton. Patient identification verified.  RNCM advised patient's son that our SW had outreached his father and that he had adamantly refused our services. Per Anthony Bolton, he said that it may have alarmed his father if he heard the word social worker because he fears she would try to put him in a facility.  He stated that he believes that his father would benefit from our services and RNCM advised that if he continues to refuse, we have to close our case. He stated that he is going to see his father on Saturday and will talk with him and see if he is willing to allow services or if he truly does not want any further outreach.  Per Anthony Bolton, patient lives at [facility] [address], which is a motel that provides living arrangements for those needing a long term place to stay. He stated that he has been frustrated since moving in because he did not have a mircowave in his room. He reported that he got tired of waiting, so he had a friend take him to [place] to buy one. He stated that maintenance then came to his room with a microwave and when he did not hear them at the door, they let themselves in and he became very upset. He stated that his father is a private person and not very social. For the most part, he believes that his father does want to be alone, but believes that if he understood the services [facility] Care Management could provide, he would be willing to allow them to outreach and visit with him.   He stated that he has  been keeping his room clean and he also has a small fridge and a hot plate.   Patient currently has a rolling walker and with PT, has been ambulatory to about 80 feet. He reported that patient is wanting to get a hearing aid.   Anthony Bolton stated that he would like for [facility] to follow patient and complete a home visit to see what needs patient has and to assist with his care. He understands that if his father does refuse our services, we cannot make him participate as it is a voluntary program and he is agreeable. He stated he would visit with him this weekend and share information about [facility] Care Management. He will return call next week to [facility] to discuss whether or not to close this case or continue to follow.  Plan: RNCM will hold off on opening case pending patient's acceptance of services. Patient's son, Anthony Bolton to visit with him this weekend and update RNCM next week. [place] R. Tamecka Milham, RN, BSN, [place] Management Coordinator [phone]

## 2017-01-12 ENCOUNTER — Other Ambulatory Visit: Payer: Self-pay

## 2017-01-12 DIAGNOSIS — Z0000 Encounter for general adult medical examination without abnormal findings: Secondary | ICD-10-CM | POA: Diagnosis not present

## 2017-01-12 NOTE — Patient Outreach (Addendum)
[facility] [facility]) Care Management  01/12/17  RYAAN VANWAGONER [birth year] [id number]  Received inbound call from patient's son, Erasto Sleight. He stated that he had the opportunity to visit with his father this past weekend and explain what [place] [facility] services were. He stated that after talking with his father and letting him know that a nurse would be checking on him and that we could assist with setting up transportation and any other needs, he was receptive. He indicted that it was likely that he did not hear or understand his conversation that was had with the [facility] SW when she called, or that as soon as he heard the word "social worker," he just shut down the conversation as he has had negative experiences in the past with social workers trying to place him. RNCM advised will talk with [facility] SW and let her know patient has had negative experience in the past and that he is now open to our services.  Patient's son lives in [place], approximately 1.5 hours away and cannot be available for a home visit during the work week. He is open to [facility] reaching out to him during the visit if any questions arise, and would like an update.   PATIENT IS AGREEABLE TO A HOME VISIT, BUT NEEDS A TEXT MESSAGE TO SCHEDULE AS HE CANNOT HEAR ON THE PHONE. HE WOULD PREFER A FEW HOURS (1-2 hours)  NOTICE OF WHEN [facility] ARRIVES. Also, please send a text when you arrive to let him know of arrival.   Plan: RNCM to complete a home visit this week and his son will notify him.   RNCM will establish plan of care during initial home visit with patient.  RNCM will notify [facility] SW and [facility] pharmacy of patient misunderstanding of [facility] services and now open to home visit this week.  [place] R. Leray Garverick, RN, BSN, [place] Management Coordinator [phone]

## 2017-01-13 ENCOUNTER — Other Ambulatory Visit: Payer: Self-pay

## 2017-01-13 DIAGNOSIS — J449 Chronic obstructive pulmonary disease, unspecified: Secondary | ICD-10-CM | POA: Diagnosis not present

## 2017-01-13 DIAGNOSIS — D696 Thrombocytopenia, unspecified: Secondary | ICD-10-CM | POA: Diagnosis not present

## 2017-01-13 DIAGNOSIS — D649 Anemia, unspecified: Secondary | ICD-10-CM | POA: Diagnosis not present

## 2017-01-13 DIAGNOSIS — I110 Hypertensive heart disease with heart failure: Secondary | ICD-10-CM | POA: Diagnosis not present

## 2017-01-13 DIAGNOSIS — I509 Heart failure, unspecified: Secondary | ICD-10-CM | POA: Diagnosis not present

## 2017-01-13 DIAGNOSIS — I2510 Atherosclerotic heart disease of native coronary artery without angina pectoris: Secondary | ICD-10-CM | POA: Diagnosis not present

## 2017-01-13 NOTE — Patient Outreach (Signed)
[facility]) Care Management  01/13/17  Anthony Bolton 1951-02-15 [id number]  Successful outreach completed with patient at his cell number, however he had a lot of difficulty hearing and understanding what RNCM was trying to say. He was very pleasant on the phone and seemed to be expecting a call from [facility]. He verbalized he was agreeable to a home visit, but was unable to clearly establish date and time.  RNCM sent patient a text with no PHI stating RNCM name and appointment scheduled and patient texted back that it was fine. He also was agreeable to allow a co-visit if appropriate.  Plan: RNCM will complete home visit and complete initial assessment. Will establish a care plan based on patient's needs.  [place] R. Jossue Rubenstein, RN, BSN, [place] Management Coordinator [phone]

## 2017-01-14 ENCOUNTER — Other Ambulatory Visit: Payer: Self-pay

## 2017-01-15 DIAGNOSIS — I110 Hypertensive heart disease with heart failure: Secondary | ICD-10-CM | POA: Diagnosis not present

## 2017-01-15 DIAGNOSIS — J449 Chronic obstructive pulmonary disease, unspecified: Secondary | ICD-10-CM | POA: Diagnosis not present

## 2017-01-15 DIAGNOSIS — D696 Thrombocytopenia, unspecified: Secondary | ICD-10-CM | POA: Diagnosis not present

## 2017-01-15 DIAGNOSIS — D649 Anemia, unspecified: Secondary | ICD-10-CM | POA: Diagnosis not present

## 2017-01-15 DIAGNOSIS — I2510 Atherosclerotic heart disease of native coronary artery without angina pectoris: Secondary | ICD-10-CM | POA: Diagnosis not present

## 2017-01-15 DIAGNOSIS — I509 Heart failure, unspecified: Secondary | ICD-10-CM | POA: Diagnosis not present
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
## 2017-01-19 NOTE — Patient Outreach (Signed)
[facility]) Care Management  01/19/2017  Anthony Bolton June 30, 1951 [id number]   Mailed out community resources per [employer]

## 2017-01-20 DIAGNOSIS — I509 Heart failure, unspecified: Secondary | ICD-10-CM | POA: Diagnosis not present

## 2017-01-20 DIAGNOSIS — D696 Thrombocytopenia, unspecified: Secondary | ICD-10-CM | POA: Diagnosis not present

## 2017-01-20 DIAGNOSIS — I2510 Atherosclerotic heart disease of native coronary artery without angina pectoris: Secondary | ICD-10-CM | POA: Diagnosis not present

## 2017-01-20 DIAGNOSIS — I110 Hypertensive heart disease with heart failure: Secondary | ICD-10-CM | POA: Diagnosis not present

## 2017-01-20 DIAGNOSIS — D649 Anemia, unspecified: Secondary | ICD-10-CM | POA: Diagnosis not present

## 2017-01-20 DIAGNOSIS — J449 Chronic obstructive pulmonary disease, unspecified: Secondary | ICD-10-CM | POA: Diagnosis not present

## 2017-01-22 DIAGNOSIS — I110 Hypertensive heart disease with heart failure: Secondary | ICD-10-CM | POA: Diagnosis not present

## 2017-01-22 DIAGNOSIS — D696 Thrombocytopenia, unspecified: Secondary | ICD-10-CM | POA: Diagnosis not present

## 2017-01-22 DIAGNOSIS — I509 Heart failure, unspecified: Secondary | ICD-10-CM | POA: Diagnosis not present

## 2017-01-22 DIAGNOSIS — D649 Anemia, unspecified: Secondary | ICD-10-CM | POA: Diagnosis not present

## 2017-01-22 DIAGNOSIS — J449 Chronic obstructive pulmonary disease, unspecified: Secondary | ICD-10-CM | POA: Diagnosis not present

## 2017-01-22 DIAGNOSIS — I2510 Atherosclerotic heart disease of native coronary artery without angina pectoris: Secondary | ICD-10-CM | POA: Diagnosis not present

## 2017-01-29 ENCOUNTER — Other Ambulatory Visit: Payer: Self-pay | Admitting: *Deleted

## 2017-01-29 ENCOUNTER — Other Ambulatory Visit: Payer: Self-pay

## 2017-01-30 ENCOUNTER — Encounter: Payer: Self-pay | Admitting: *Deleted

## 2017-01-30 NOTE — Patient Outreach (Signed)
[facility]) Care Management  01/30/2017  Anthony Bolton 1951-02-06 [id number]   CSW was able to make initial contact with patient today to perform assessment, as well as assess and assist with social work needs and services, when CSW met with patient at patient's hotel room.  CSW introduced self, explained role and types of services provided through [place] [facility] ([place] [facility]).  CSW further explained to patient that CSW works with patient's RNCM, also with [place] [facility], Tish Men. CSW then explained the reason for the call, indicating that [name] thought that patient would benefit from social work services and resources to assist with obtaining transportation to and from physician appointments, as well as finances to obtain food and pay medical expenses.  CSW obtained two HIPAA compliant identifiers from patient, which included patient's name and date of birth. Patient is extremely hard of hearing so CSW practically had to scream to be heard.  Patient has an order for hearing aides, but missed his appointment at the [place] to be fitted for them.  CSW encouraged patient to reschedule his appointment and CSW will make transportation arrangements.  Patient reported that he really needs a rolling walker, having fallen several times within the last year, a few times resulting in injuries.  CSW agreed to purchase patient a rolling walker through [place], as CSW has completed a Chiropodist Form and patient definitely qualifies. Patient is interested in applying for Food Stamps, as well as obtaining his Adult Medicaid card, both through the [place].  Patient would also like to be referred to [employer] as well as [employer], to receive reliable and affordable transportation to and from all  his physician appointments.  Last, patient would like to be referred to [employer] through [place].  CSW agreed to complete all applications, and place all referrals on patient's behalf, due to patient's hearing impairment.  CSW will follow-up with patient within the next two weeks to report findings. Nat Christen, BSW, MSW, LCSW  Licensed Education officer, [facility]  Mailing [place] [address], [place], [place] [address] Physical Address-[address], [place], [place] [address] Toll Free Main # [phone] Fax # [phone] Cell # [phone]  Office # [phone] [email or web address]

## 2017-02-05 ENCOUNTER — Other Ambulatory Visit: Payer: Self-pay

## 2017-02-05 ENCOUNTER — Other Ambulatory Visit: Payer: Self-pay | Admitting: *Deleted

## 2017-02-05 NOTE — Patient Outreach (Signed)
[facility]) Care Management  02/05/2017  Anthony Bolton 1951/01/24 [id number]   CSW received a call from patient's RNCM with [place] [facility], Tish Men indicating that patient will needs transportation assistance to upcoming appointment with primary care physician.  [name] went on to say that patient's appointment with Dr. Lucianne Lei is scheduled for Tuesday, May 15th at noon at [address] [place] [address] [place], [place], [place] [address].  CSW has submitted a transportation request to Freda Jackson, Care Management Assistant with [place] [facility], who will make arrangements for patient through [employer].  Patient's SCAT Paramedic) application is still pending.  Patient is also awaiting a return call from his Adult Medicaid case worker regarding Medicaid Transportation. Nat Christen, BSW, MSW, LCSW  Licensed Education officer, environmental Health System  Mailing [place] [address] [place], [place] [address] Physical Address-[address], [place], [place] [address] Toll Free Main # [phone] Fax # [phone] Cell # [phone]  Office # [phone] [email or web address]

## 2017-02-05 NOTE — Patient Outreach (Signed)
[facility]) Care Management  Late entry for 01/29/2017  Anthony Bolton 1950/12/09 [id number]  RNCM arrived for appointment as scheduled with patient for home visit. However, patient was confused and thought that RNCM had just left and had called a taxi to pick him up to get groceries.RNCM was only able to visit with patient briefly before taxi arrived. [facility] social worker had arrived early and completed her visit prior to [name] arrival and he thought he was done for the day. Patient currently does not complain of any shortness of breath or any chest pain. His respirations were even and unlabored. His SpO2 was 96%. He stated that he does not have any needs or concerns. Will reschedule home visit for another day.  [name], RN, BSN, [place] Management Coordinator [phone]

## 2017-02-05 NOTE — Patient Outreach (Signed)
[place] [facility]) Care Management   Late entry for 02/04/2017  Anthony Bolton 08-17-1951 [id number]  Received text message from patient requesting to know when our next appointment was for a home visit. He also indicated that he needed transportation for Tuesday 5/15 for an appointment with Dr. Criss Rosales at noon.  RNCM scheduled appointment for home visit for next week and also notified Nat Christen, SW of patient's need for transportation for his appointment next week.  Plan: Will follow up with patient next week at home visit.  [place] R. Dresden Lozito, RN, BSN, [place] Management Coordinator [phone]

## 2017-02-09 ENCOUNTER — Other Ambulatory Visit: Payer: Self-pay

## 2017-02-09 ENCOUNTER — Other Ambulatory Visit: Payer: Self-pay | Admitting: *Deleted

## 2017-02-09 NOTE — Patient Outreach (Signed)
[facility]) Care Management  02/09/2017  Anthony Bolton 13-Dec-1950 [id number]   CSW was able to follow-up with patient today to discuss recent referrals made to various community agencies and resources, on patient's behalf. CSW was able to check the status of patient on the waiting list for [employer], through [employer] of [facility], which is quite extensive. Due to the severity of patient's malnutrition, CSW has made a referral to Kennyth Lose, Care Management Assistant with [place] [facility], for patient to receive a 30-day supply of Mobile Meals, at the expense of [place] [facility]. [name] will notify CSW when these meals are scheduled to begin so that CSW can notify patient. CSW has requested that patient be eligible to receive $100 [employer] through the [place] with [employer], having already completed an application for [employer].  Patient is an Adult Medicaid recipient, which automatically qualifies him for the funds, as he is below the poverty guidelines for the [place] for fiscal year 2018.  CSW will use the $100.00 Visa gift card to purchase patient a rolling walker for home use.  CSW will plan to deliver the rolling walker to patient during the next scheduled home visit. CSW has completed a SCAT Paramedic) application for patient and submitted directly to [employer]) for processing.  Patient is aware that a representative with [employer] will be contacting him directly to complete a telephone interview.  In the meantime, CSW has agreed to arrange transportation services for patient through [employer].  Patient has an appointment with his Primary Care Physician, Dr. Lucianne Lei, scheduled for Wednesday, May 16th at 11:00am, for which [employer] has been arranged via Freda Jackson, Care Management  Assistant, also with [place] [facility]. In talking with patient's Adult Medicaid Case Worker at the [place], patient is not eligible to receive Food Stamps at this time.  However, patient's case worker agreed to submit a [employer] application on patient's behalf, realizing that patient is severely hard of hearing.  Patient's case worker also agreed to mail patient another Adult Medicaid coverage card, as patient admits to misplacing his original.  CSW agreed to meet with patient in one week to perform a routine home visit. Nat Christen, BSW, MSW, LCSW  Licensed Education officer, environmental Health System  Mailing [place] [address] [place], [place] [address] Physical Address-[address], [place], [place] [address] Toll Free Main # [phone] Fax # [phone] Cell # [phone]  Office # [phone] [email or web address]

## 2017-02-10 DIAGNOSIS — J441 Chronic obstructive pulmonary disease with (acute) exacerbation: Secondary | ICD-10-CM | POA: Diagnosis not present

## 2017-02-10 DIAGNOSIS — H900 Conductive hearing loss, bilateral: Secondary | ICD-10-CM | POA: Diagnosis not present

## 2017-02-10 DIAGNOSIS — I10 Essential (primary) hypertension: Secondary | ICD-10-CM | POA: Diagnosis not present

## 2017-02-10 DIAGNOSIS — G4700 Insomnia, unspecified: Secondary | ICD-10-CM | POA: Diagnosis not present

## 2017-02-10 DIAGNOSIS — N401 Enlarged prostate with lower urinary tract symptoms: Secondary | ICD-10-CM | POA: Diagnosis not present

## 2017-02-10 DIAGNOSIS — Z590 Homelessness: Secondary | ICD-10-CM | POA: Diagnosis not present

## 2017-02-10 DIAGNOSIS — R634 Abnormal weight loss: Secondary | ICD-10-CM | POA: Diagnosis not present

## 2017-02-10 NOTE — Patient Outreach (Signed)
[place] [facility]) Care Management  02/10/2017  NORA ROOKE 04-12-1951 [id number]   Request received from Nat Christen to request 30 day Mobile Meals at [place].  Referral made 02/10/17.  Meals will begin 02/11/17.  Programmer, multimedia will notify patient.

## 2017-02-16 ENCOUNTER — Other Ambulatory Visit: Payer: Self-pay | Admitting: *Deleted

## 2017-02-17 NOTE — Patient Outreach (Signed)
[facility]) Care Management  02/17/2017  Anthony Bolton 1951/06/05 [id number]   CSW attempted to meet with patient today to perform a routine home visit; however, patient was not available at the time of CSW's arrival.  CSW is aware that patient is very hard of hearing; therefore, CSW knocked on patient's door on several different occasions, without success. CSW could hear patient's television blaring on the other side of the door but patient never answered the door. CSW tried opening patient's door, but it was locked from the inside. CSW also tried calling patient on his cell phone, but received no answer. CSW was unable to leave a message.  CSW will continue to try and make contact with patient to reschedule the visit. Nat Christen, BSW, MSW, LCSW  Licensed Education officer, environmental Health System  Mailing [place] [address] [place], [place] [address] Physical Address-[address], [place], [place] [address] Toll Free Main # [phone] Fax # [phone] Cell # [phone]  Office # [phone] [email or web address]

## 2017-02-19 NOTE — Patient Outreach (Signed)
[place] [facility]) Care Management   Late entry for 02/09/2017  Anthony Bolton 15-Nov-1950 [id number]  Anthony Bolton is an 66 y.o. male   Home visit completed with patient. Patient was awaiting transportation for a doctor's appointment with his PCP.  Subjective:   Objective:   ROS  Physical Exam  Constitutional: He is oriented to person, place, and time.  Cardiovascular: Normal rate, regular rhythm and normal heart sounds.   Respiratory: Effort normal and breath sounds normal.  Neurological: He is alert and oriented to person, place, and time.  Skin: Skin is warm and dry.    Encounter Medications:   Outpatient Encounter Prescriptions as of 02/09/2017  Medication Sig  . albuterol (PROVENTIL HFA;VENTOLIN HFA) 108 (90 Base) MCG/ACT inhaler Inhale 2 puffs into the lungs every 4 (four) hours as needed for wheezing or shortness of breath.  . metoprolol tartrate (LOPRESSOR) 25 MG tablet Take 0.5 tablets (12.5 mg total) by mouth 2 (two) times daily.  . mometasone-formoterol (DULERA) 100-5 MCG/ACT AERO Inhale 2 puffs into the lungs 2 (two) times daily at 10 AM and 5 PM.  . pantoprazole (PROTONIX) 40 MG tablet Take 1 tablet (40 mg total) by mouth daily.  . polyethylene glycol (MIRALAX / GLYCOLAX) packet Take 17 g by mouth daily.  [place] Kitchen tiotropium (SPIRIVA) 18 MCG inhalation capsule Place 1 capsule (18 mcg total) into inhaler and inhale daily.  [place] Kitchen aspirin EC 81 MG tablet Take 1 tablet (81 mg total) by mouth daily.  . feeding supplement, ENSURE ENLIVE, (ENSURE ENLIVE) LIQD Take 237 mLs by mouth 3 (three) times daily between meals. (Patient not taking: Reported on 01/14/2017)  . ipratropium-albuterol (DUONEB) 0.5-2.5 (3) MG/3ML SOLN Take 3 mLs by nebulization every 6 (six) hours as needed (wheezing, shortness of breath). (Patient not taking: Reported on 01/14/2017)  . nicotine (NICODERM CQ - DOSED IN MG/24 HOURS) 14 mg/24hr patch Place 1 patch (14 mg total) onto the skin daily. (Patient  not taking: Reported on 02/09/2017)  . predniSONE (DELTASONE) 5 MG tablet 8 tablets for 2 days, 7 tablets for 2 days, 6 tablets for 2 days, 5 tablets for 2 days, 4 tablets for 2 days, 3 tablets for 2 days, 2 tablets for 2 days, 1 tablet for 2 days then discontinue (Patient not taking: Reported on 01/14/2017)   No facility-administered encounter medications on file as of 02/09/2017.     Functional Status:   In your present state of health, do you have any difficulty performing the following activities: 01/30/2017 01/14/2017  Hearing? Tempie Donning  Vision? Y Y  Difficulty concentrating or making decisions? N N  Walking or climbing stairs? N N  Dressing or bathing? N N  Doing errands, shopping? N N  Preparing Food and eating ? N N  Using the Toilet? N N  In the past six months, have you accidently leaked urine? N N  Do you have problems with loss of bowel control? N N  Managing your Medications? N N  Managing your Finances? N N  Housekeeping or managing your Housekeeping? Y Y  Some recent data might be hidden    Fall/Depression Screening:    Fall Risk  01/30/2017 01/14/2017 01/04/2017  Falls in the past year? Yes Yes No  Number falls in past yr: 2 or more 1 -  Injury with Fall? Yes Yes -  Risk Factor Category  High Fall Risk High Fall Risk -  Risk for fall due to : History of fall(s);Impaired balance/gait;Impaired mobility History  of fall(s);Impaired balance/gait Impaired balance/gait;Impaired mobility  Follow up Education provided;Falls prevention discussed Falls evaluation completed;Education provided;Falls prevention discussed -   PHQ 2/9 Scores 01/30/2017 01/14/2017 01/04/2017  PHQ - 2 Score 0 0 -  Exception Documentation - - Other- indicate reason in comment box  Not completed - - pt. very HOH, call completed with son    Assessment:  RNCM was able to assess patient prior to his transportation arriving. Patient has no complaints or concerns.  Patient is not having any shortness of breath and no chest  pain. He stated that he has been feeling pretty good.  While waiting for his ride, RNCM assessed that he had all of his medications and he was missing his baby aspirin. Patient stated he could not find it at the store so he picked up some ibuprofen and has been taking this instead.   RNCM contacted pateint's doctor's office to let them know prior to his appointment that he was taking ibuprofen instead of baby aspirin and receptionist stated that patient does not have an appointment today. Confirmed with patient that he thought he had an appointment with his PCP and RNCM advised he does not have one scheduled. RNCM was able to schedule appointment for patient for the following day.  RNCM contacted [employer], LCSW and advised his transportation did not show up today, but it worked out because he did not have an appointment. Advised patient does have an appointment scheduled for tomorrow May 16 at 11:00 with PCP (Dr. Criss Rosales) and will need transportation. She will take care of scheduling.  Patient with no other concerns at present and expressed gratitude for assisting with appointment.  Plan: Will follow up with patient within next month to re-evaluate respiratory status.  [place] R. Nimo Verastegui, RN, BSN, [place] Management Coordinator [phone]

## 2017-03-09 ENCOUNTER — Other Ambulatory Visit: Payer: Self-pay | Admitting: *Deleted

## 2017-03-09 NOTE — Patient Outreach (Signed)
[facility] [facility]) Care Management  03/09/2017  Anthony Bolton 01-Oct-1950 [id number]   CSW made an attempt to try and contact patient today to reschedule the routine home visit, for which patient was unavailable; however, patient did not answer the phone at the time of CSW's call.  A HIPAA compliant message was left for patient on voicemail.  CSW is currently awaiting a return call. CSW will make a second outreach attempt within the next week, if CSW does not receive a return call from patient in the meantime. Nat Christen, BSW, MSW, LCSW  Licensed Education officer, environmental Health System  Mailing [place] [address], [place], [place] [address] Physical Address-[address], [place], [place] [address] Toll Free Main # [phone] Fax # [phone] Cell # [phone]  Office # [phone] [email or web address]

## 2017-03-15 ENCOUNTER — Other Ambulatory Visit: Payer: Self-pay | Admitting: *Deleted

## 2017-03-15 NOTE — Patient Outreach (Signed)
[place] [facility]) Care Management  03/15/2017  Anthony Bolton [birth year] [id number]   CSW made a second attempt to try and contact patient today to follow-up regarding social work services and resources, as well as assess and assist with social needs and services, without success.  A HIPAA compliant message was left for patient on voicemail.  CSW is currently awaiting a return call. CSW will make a third and final outreach attempt within the next two weeks, when CSW returns to the office from vacation, if CSW does not receive a return call from patient in the meantime. Nat Christen, BSW, MSW, LCSW  Licensed Education officer, environmental Health System  Mailing [place] [address] [place], [place] [address] Physical Address-[address], [place], [place] [address] Toll Free Main # [phone] Fax # [phone] Cell # [phone]  Office # [phone] [email or web address]

## 2017-03-23 ENCOUNTER — Other Ambulatory Visit: Payer: Self-pay

## 2017-03-23 NOTE — Patient Outreach (Signed)
[place] [facility]) Care Management  03/23/17  Anthony Bolton [birth year] [id number]  Attempted to reach patient without success. Left patient a HIPAA compliant voicemail with RNCM contact and invited callback to schedule home visit.  [place] R. Jermon Chalfant, RN, BSN, [place] Management Coordinator [phone]

## 2017-03-29 ENCOUNTER — Other Ambulatory Visit: Payer: Self-pay

## 2017-03-29 NOTE — Patient Outreach (Signed)
[facility] [facility]) Care Management  03/29/17  Anthony Bolton March 04, 1951 [id number]  Second attempt to reach patient without success. RNCM sent a HIPAA compliant text with RNCM name and agency, requesting to talk with patient since he is hard of hearing on the phone. RNCM has previously had  success with scheduling home visits via text. However, patient has did not respond.   Plan: If patient does not respond, will make a 3rd attempt later this week to reach patient.  [place] R. Shakela Donati, RN, BSN, [place] Management Coordinator [phone]

## 2017-04-02 ENCOUNTER — Emergency Department (HOSPITAL_COMMUNITY): Payer: Medicare Other

## 2017-04-02 ENCOUNTER — Encounter (HOSPITAL_COMMUNITY): Payer: Self-pay

## 2017-04-02 ENCOUNTER — Other Ambulatory Visit: Payer: Self-pay

## 2017-04-02 ENCOUNTER — Other Ambulatory Visit: Payer: Self-pay | Admitting: *Deleted

## 2017-04-02 ENCOUNTER — Inpatient Hospital Stay (HOSPITAL_COMMUNITY)
Admission: EM | Admit: 2017-04-02 | Discharge: 2017-04-07 | DRG: 871 | Disposition: A | Discharge status: Hospice | Payer: Medicare Other | Attending: Internal Medicine | Admitting: Internal Medicine

## 2017-04-02 ENCOUNTER — Encounter: Payer: Self-pay | Admitting: *Deleted

## 2017-04-02 DIAGNOSIS — E876 Hypokalemia: Secondary | ICD-10-CM | POA: Diagnosis not present

## 2017-04-02 DIAGNOSIS — C787 Secondary malignant neoplasm of liver and intrahepatic bile duct: Secondary | ICD-10-CM | POA: Diagnosis present

## 2017-04-02 DIAGNOSIS — A419 Sepsis, unspecified organism: Principal | ICD-10-CM | POA: Diagnosis present

## 2017-04-02 DIAGNOSIS — I10 Essential (primary) hypertension: Secondary | ICD-10-CM | POA: Diagnosis present

## 2017-04-02 DIAGNOSIS — R7401 Elevation of levels of liver transaminase levels: Secondary | ICD-10-CM | POA: Diagnosis present

## 2017-04-02 DIAGNOSIS — R627 Adult failure to thrive: Secondary | ICD-10-CM | POA: Diagnosis present

## 2017-04-02 DIAGNOSIS — J189 Pneumonia, unspecified organism: Secondary | ICD-10-CM | POA: Diagnosis present

## 2017-04-02 DIAGNOSIS — I5022 Chronic systolic (congestive) heart failure: Secondary | ICD-10-CM | POA: Diagnosis present

## 2017-04-02 DIAGNOSIS — J449 Chronic obstructive pulmonary disease, unspecified: Secondary | ICD-10-CM | POA: Diagnosis not present

## 2017-04-02 DIAGNOSIS — R0902 Hypoxemia: Secondary | ICD-10-CM | POA: Diagnosis not present

## 2017-04-02 DIAGNOSIS — K7682 Hepatic encephalopathy: Secondary | ICD-10-CM

## 2017-04-02 DIAGNOSIS — Z66 Do not resuscitate: Secondary | ICD-10-CM | POA: Diagnosis present

## 2017-04-02 DIAGNOSIS — R7989 Other specified abnormal findings of blood chemistry: Secondary | ICD-10-CM

## 2017-04-02 DIAGNOSIS — Z681 Body mass index (BMI) 19 or less, adult: Secondary | ICD-10-CM

## 2017-04-02 DIAGNOSIS — T391X1D Poisoning by 4-Aminophenol derivatives, accidental (unintentional), subsequent encounter: Secondary | ICD-10-CM

## 2017-04-02 DIAGNOSIS — R64 Cachexia: Secondary | ICD-10-CM | POA: Diagnosis present

## 2017-04-02 DIAGNOSIS — K7290 Hepatic failure, unspecified without coma: Secondary | ICD-10-CM

## 2017-04-02 DIAGNOSIS — C259 Malignant neoplasm of pancreas, unspecified: Secondary | ICD-10-CM

## 2017-04-02 DIAGNOSIS — E871 Hypo-osmolality and hyponatremia: Secondary | ICD-10-CM | POA: Diagnosis present

## 2017-04-02 DIAGNOSIS — E860 Dehydration: Secondary | ICD-10-CM | POA: Diagnosis present

## 2017-04-02 DIAGNOSIS — J9601 Acute respiratory failure with hypoxia: Secondary | ICD-10-CM | POA: Diagnosis not present

## 2017-04-02 DIAGNOSIS — R410 Disorientation, unspecified: Secondary | ICD-10-CM | POA: Diagnosis not present

## 2017-04-02 DIAGNOSIS — D72829 Elevated white blood cell count, unspecified: Secondary | ICD-10-CM | POA: Diagnosis present

## 2017-04-02 DIAGNOSIS — R531 Weakness: Secondary | ICD-10-CM | POA: Diagnosis not present

## 2017-04-02 DIAGNOSIS — K7200 Acute and subacute hepatic failure without coma: Secondary | ICD-10-CM | POA: Diagnosis not present

## 2017-04-02 DIAGNOSIS — Z79899 Other long term (current) drug therapy: Secondary | ICD-10-CM

## 2017-04-02 DIAGNOSIS — R000 Tachycardia, unspecified: Secondary | ICD-10-CM

## 2017-04-02 DIAGNOSIS — J690 Pneumonitis due to inhalation of food and vomit: Secondary | ICD-10-CM | POA: Diagnosis present

## 2017-04-02 DIAGNOSIS — K920 Hematemesis: Secondary | ICD-10-CM | POA: Diagnosis present

## 2017-04-02 DIAGNOSIS — E785 Hyperlipidemia, unspecified: Secondary | ICD-10-CM | POA: Diagnosis present

## 2017-04-02 DIAGNOSIS — R740 Nonspecific elevation of levels of transaminase and lactic acid dehydrogenase [LDH]: Secondary | ICD-10-CM | POA: Diagnosis present

## 2017-04-02 DIAGNOSIS — N179 Acute kidney failure, unspecified: Secondary | ICD-10-CM | POA: Diagnosis not present

## 2017-04-02 DIAGNOSIS — R778 Other specified abnormalities of plasma proteins: Secondary | ICD-10-CM | POA: Diagnosis present

## 2017-04-02 DIAGNOSIS — I110 Hypertensive heart disease with heart failure: Secondary | ICD-10-CM | POA: Diagnosis present

## 2017-04-02 DIAGNOSIS — Z720 Tobacco use: Secondary | ICD-10-CM | POA: Diagnosis present

## 2017-04-02 DIAGNOSIS — F17210 Nicotine dependence, cigarettes, uncomplicated: Secondary | ICD-10-CM | POA: Diagnosis present

## 2017-04-02 DIAGNOSIS — H9190 Unspecified hearing loss, unspecified ear: Secondary | ICD-10-CM | POA: Diagnosis present

## 2017-04-02 DIAGNOSIS — Z7982 Long term (current) use of aspirin: Secondary | ICD-10-CM

## 2017-04-02 DIAGNOSIS — I248 Other forms of acute ischemic heart disease: Secondary | ICD-10-CM | POA: Diagnosis present

## 2017-04-02 DIAGNOSIS — I2510 Atherosclerotic heart disease of native coronary artery without angina pectoris: Secondary | ICD-10-CM | POA: Diagnosis present

## 2017-04-02 DIAGNOSIS — R932 Abnormal findings on diagnostic imaging of liver and biliary tract: Secondary | ICD-10-CM

## 2017-04-02 DIAGNOSIS — D649 Anemia, unspecified: Secondary | ICD-10-CM | POA: Diagnosis present

## 2017-04-02 DIAGNOSIS — E43 Unspecified severe protein-calorie malnutrition: Secondary | ICD-10-CM | POA: Diagnosis present

## 2017-04-02 DIAGNOSIS — Z7951 Long term (current) use of inhaled steroids: Secondary | ICD-10-CM

## 2017-04-02 DIAGNOSIS — I428 Other cardiomyopathies: Secondary | ICD-10-CM | POA: Diagnosis present

## 2017-04-02 DIAGNOSIS — C799 Secondary malignant neoplasm of unspecified site: Secondary | ICD-10-CM

## 2017-04-02 DIAGNOSIS — R945 Abnormal results of liver function studies: Secondary | ICD-10-CM

## 2017-04-02 DIAGNOSIS — K8689 Other specified diseases of pancreas: Secondary | ICD-10-CM

## 2017-04-02 DIAGNOSIS — Z7189 Other specified counseling: Secondary | ICD-10-CM

## 2017-04-02 DIAGNOSIS — Z515 Encounter for palliative care: Secondary | ICD-10-CM | POA: Diagnosis present

## 2017-04-02 LAB — PROTIME-INR
INR: 2.2
Prothrombin Time: 24.8 seconds — ABNORMAL HIGH (ref 11.4–15.2)

## 2017-04-02 LAB — I-STAT CG4 LACTIC ACID, ED: Lactic Acid, Venous: 3.75 mmol/L (ref 0.5–1.9)

## 2017-04-02 LAB — TYPE AND SCREEN
ABO/RH(D): A POS
ANTIBODY SCREEN: NEGATIVE

## 2017-04-02 LAB — ACETAMINOPHEN LEVEL

## 2017-04-02 MED ORDER — PANTOPRAZOLE SODIUM 40 MG IV SOLR
40.0000 mg | Freq: Two times a day (BID) | INTRAVENOUS | Status: DC
  Administered 2017-04-06 – 2017-04-07 (×3): 40 mg via INTRAVENOUS
  Filled 2017-04-02 (×3): qty 40

## 2017-04-02 MED ORDER — SODIUM CHLORIDE 0.9 % IV BOLUS (SEPSIS)
500.0000 mL | Freq: Once | INTRAVENOUS | Status: AC
  Administered 2017-04-02: 500 mL via INTRAVENOUS

## 2017-04-02 MED ORDER — SODIUM CHLORIDE 0.9 % IV BOLUS (SEPSIS)
1000.0000 mL | Freq: Once | INTRAVENOUS | Status: AC
  Administered 2017-04-03: 1000 mL via INTRAVENOUS

## 2017-04-02 MED ORDER — SODIUM CHLORIDE 0.9 % IV SOLN
80.0000 mg | Freq: Once | INTRAVENOUS | Status: AC
  Administered 2017-04-02: 23:00:00 80 mg via INTRAVENOUS
  Filled 2017-04-02: qty 80

## 2017-04-02 MED ORDER — SODIUM CHLORIDE 0.9 % IV SOLN
8.0000 mg/h | INTRAVENOUS | Status: AC
  Administered 2017-04-02 – 2017-04-05 (×6): 8 mg/h via INTRAVENOUS
  Filled 2017-04-02 (×11): qty 80

## 2017-04-02 NOTE — ED Notes (Signed)
Cg-4 reported to Dr. Colvin Caroli

## 2017-04-02 NOTE — ED Notes (Signed)
Blood collected by edp

## 2017-04-02 NOTE — ED Notes (Signed)
2L NS infusions started

## 2017-04-02 NOTE — Patient Outreach (Signed)
[facility]) Care Management  04/02/17  Anthony Bolton 12-Feb-1951 [id number]  Third and final outreach attempt completed without success. RNCM was unable to leave a voicemail due to message on phone stating patient is unable to take cals at this time.   Attempted to reach patient's son, Anthony Bolton at [phone] without success. Left HIPAA compliant voicemail with RNCM contact information and requested callback.  Plan- RNCM will send outreach barrier letter and wait 10 business days before completing case closure if no return call has been received.  Anthony R. Astella Desir, RN, BSN, [place] Management Coordinator [phone]

## 2017-04-02 NOTE — ED Notes (Signed)
QNS blood draw,  Still need blue top tube.

## 2017-04-02 NOTE — ED Notes (Signed)
Dr. Johnney Killian at bedside to assess patient.

## 2017-04-02 NOTE — Patient Outreach (Signed)
[facility]) Care Management  04/02/2017  ESA RADEN 01-23-1951 [id number]  CSW made a third and final attempt to try and contact patient today to perform phone assessment, as well as assess and assist with social work needs and services, without success.  A HIPAA compliant message was left for patient on voicemail.  CSW continues to await a return call.  CSW will mail an outreach letter to patient's home, encouraging patient to contact CSW at their earliest convenience, if patient is interested in receiving social work services through [place] with [facility] [employer].  If CSW does not receive a return call from patient within the next 10 business days, CSW will proceed with case closure.  Required number of phone attempts will have been made and outreach letter mailed.   Nat Christen, BSW, MSW, LCSW  Licensed Education officer, environmental Health System  Mailing [place] [address] [place], [place] [address] Physical Address-[address], [place], [place] [address] Toll Free Main # [phone] Fax # [phone] Cell # [phone]  Office # [phone] [email or web address]

## 2017-04-02 NOTE — ED Notes (Signed)
QNS and reject x2,  Per nurse wait and try to collect blood after fluids.

## 2017-04-02 NOTE — ED Provider Notes (Signed)
[facility] DEPT Provider Note   CSN: [id number] Arrival date & time: 04/02/17  2130     History   Chief Complaint Chief Complaint  Patient presents with  . Jaundice    HPI Anthony Bolton is a 66 y.o. male.  HPI Patient is brought in by EMS. He had been staying and extended stay motel. The manager had not seen him in several days so she had EMS check on the patient. He was found on the floor incontinent of stool and urine. He was alert but confused. Patient follows commands. He reports he was unable to get his medications for several days. He is a poor historian and doesn't really explain what happened. He denies pain at this time. Past Medical History:  Diagnosis Date  . Adrenal adenoma, left 11/2015   adenoma on 2015 MRI, stable per CT 11/2015.   Marland Kitchen Anemia 09/2016   normocytic.  Hgb 10.2 on 10/25/16  . CHF (congestive heart failure) ([place]) 10/2013   per [facility]: LVEF 20-25% 10/2013, 50-50% 07/2015  . COPD (chronic obstructive pulmonary disease) ([place])   . HLD (hyperlipidemia)   . HOH (hard of hearing)   . Migraines   . Prostate hypertrophy 09/2016   per CT 10/22/16.    . Protein-calorie malnutrition, severe ([place]) 09/2016   adult failure to thrive dx during 09/2016 admission  . SBO (small bowel obstruction) ([place]) 03/2015, 09/2016  . Thrombocytopenia ([place]) 09/2016   platelets 126K 10/25/16.      Patient Active Problem List   Diagnosis Date Noted  . Abnormal thyroid function test   . Normocytic anemia   . Elevated troponin   . Acetaminophen toxicity 12/04/2016  . ALF (acute liver failure) 12/04/2016  . Acetaminophen overdose of undetermined intent   . 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 Aortic atherosclerosis ([place]) 10/22/2016  . Protein-calorie malnutrition, severe 10/07/2016  . Acute respiratory failure with hypoxia ([place]) 10/07/2016  . Other emphysema ([place]) 10/07/2016  . Transaminitis 12/03/2015  . Dehydration   . High anion gap metabolic acidosis 95/28/4132  .  Malnutrition due to starvation ([place]) 12/02/2015  . Leukocytosis 12/02/2015  . Abdominal pain 10/12/2015  . Nausea & vomiting 10/12/2015  . Chest pain 10/12/2015  . AKI (acute kidney injury) ([place]) 10/12/2015  . SBO (small bowel obstruction) ([place]) 10/12/2015  . Acute respiratory failure ([place]) 08/11/2015  . Hard of hearing 08/11/2015  . CAD (nonobstructive per cath 2015) 08/11/2015  . HTN (hypertension) 08/11/2015  . HLD (hyperlipidemia)   . Malnutrition of moderate degree ([place]) 03/02/2015  . COPD exacerbation ([place]) 03/01/2015  . Chronic systolic congestive heart failure, NYHA class 3 ([place]) 03/01/2015  . Hyperthyroidism 11/06/2013  . Mass of adrenal gland ([place]) 11/04/2013  . Tobacco abuse 11/04/2013  . Hyponatremia 10/31/2013  . COPD (chronic obstructive pulmonary disease) ([place]) 10/31/2013    Past Surgical History:  Procedure Laterality Date  . APPENDECTOMY    . cardiac stents     No stent history  . LEFT HEART CATHETERIZATION WITH CORONARY ANGIOGRAM N/A 11/07/2013   Procedure: LEFT HEART CATHETERIZATION WITH CORONARY ANGIOGRAM;  Surgeon: Clent Demark, MD;  Location: [facility] CATH LAB;  Service: Cardiovascular;  Laterality: N/A;       Home Medications    Prior to Admission medications   Medication Sig Start Date End Date Taking? Authorizing Provider  albuterol (PROVENTIL HFA;VENTOLIN HFA) 108 (90 Base) MCG/ACT inhaler Inhale 2 puffs into the lungs every 4 (four) hours as needed for wheezing or shortness of breath. 09/19/16  Quintella Reichert, MD  aspirin EC 81 MG tablet Take 1 tablet (81 mg total) by mouth daily. 10/26/16   Hongalgi, Lenis Dickinson, MD  feeding supplement, ENSURE ENLIVE, (ENSURE ENLIVE) LIQD Take 237 mLs by mouth 3 (three) times daily between meals. Patient not taking: Reported on 01/14/2017 12/09/16   Reyne Dumas, MD  ipratropium-albuterol (DUONEB) 0.5-2.5 (3) MG/3ML SOLN Take 3 mLs by nebulization every 6 (six) hours as needed (wheezing, shortness of breath). Patient not  taking: Reported on 01/14/2017 10/07/16   Orson Eva, MD  metoprolol tartrate (LOPRESSOR) 25 MG tablet Take 0.5 tablets (12.5 mg total) by mouth 2 (two) times daily. 12/09/16   Reyne Dumas, MD  mometasone-formoterol (DULERA) 100-5 MCG/ACT AERO Inhale 2 puffs into the lungs 2 (two) times daily at 10 AM and 5 PM. 12/10/16   Reyne Dumas, MD  nicotine (NICODERM CQ - DOSED IN MG/24 HOURS) 14 mg/24hr patch Place 1 patch (14 mg total) onto the skin daily. Patient not taking: Reported on 02/09/2017 12/10/16   Reyne Dumas, MD  pantoprazole (PROTONIX) 40 MG tablet Take 1 tablet (40 mg total) by mouth daily. 12/10/16   Reyne Dumas, MD  polyethylene glycol (MIRALAX / GLYCOLAX) packet Take 17 g by mouth daily. 12/10/16   Reyne Dumas, MD  predniSONE (DELTASONE) 5 MG tablet 8 tablets for 2 days, 7 tablets for 2 days, 6 tablets for 2 days, 5 tablets for 2 days, 4 tablets for 2 days, 3 tablets for 2 days, 2 tablets for 2 days, 1 tablet for 2 days then discontinue Patient not taking: Reported on 01/14/2017 12/10/16   Reyne Dumas, MD  tiotropium (SPIRIVA) 18 MCG inhalation capsule Place 1 capsule (18 mcg total) into inhaler and inhale daily. 09/19/16   Quintella Reichert, MD    Family History Family History  Problem Relation Age of Onset  . Stroke Mother     Social History Social History  Substance Use Topics  . Smoking status: Current Every Day Smoker    Packs/day: 0.50    Types: Cigarettes  . Smokeless tobacco: Never Used  . Alcohol use No     Allergies   Patient has no known allergies.   Review of Systems Review of Systems Level V caveat cannot obtain review systems due to patient confusion.  Physical Exam Updated Vital Signs BP 106/88   Pulse 95   Temp 97.8 F (36.6 C) (Rectal)   Resp 19   SpO2 98%   Physical Exam  Constitutional:  Patient is alert. He is extremely jaundiced and deconditioned. No respiratory distress at rest. He is very poorly maintained with brownish black material  all over his face and beard.  HENT:  Head: Normocephalic and atraumatic.  His mucous membranes are dry.  Eyes: EOM are normal.  Icterus  Neck: Neck supple.  Cardiovascular:  Mild tachycardia. No gross rub murmur gallop.  Pulmonary/Chest:  No respiratory distress. Soft breath sounds with occasional expiratory wheeze.  Abdominal:  Mild abdominal distention consistent with ascites but soft and nontender.  Genitourinary:  Genitourinary Comments: No general edema of the genitals.  Musculoskeletal:  No peripheral edema of the lower extremities. Extremities are cachectic. No extremity deformities.  Neurological:  Patient rests but awakens to alertness. His speech however somewhat difficult to understand. No focal neurologic deficit. Patient generally weak but can use all extremities to command.  Skin: Skin is warm and dry.  Severe jaundice  Psychiatric: He has a normal mood and affect.     ED Treatments / Results  Labs (all labs ordered are listed, but only abnormal results are displayed) Labs Reviewed  PROTIME-INR - Abnormal; Notable for the following:       Result Value   Prothrombin Time 24.8 (*)    All other components within normal limits  ACETAMINOPHEN LEVEL - Abnormal; Notable for the following:    Acetaminophen (Tylenol), Serum <10 (*)    All other components within normal limits  AMMONIA - Abnormal; Notable for the following:    Ammonia 51 (*)    All other components within normal limits  CBC WITH DIFFERENTIAL/PLATELET - Abnormal; Notable for the following:    WBC 20.7 (*)    RBC 4.07 (*)    Hemoglobin 11.9 (*)    HCT 34.5 (*)    RDW 15.7 (*)    Neutro Abs 18.9 (*)    All other components within normal limits  COMPREHENSIVE METABOLIC PANEL - Abnormal; Notable for the following:    Sodium 129 (*)    Potassium 3.4 (*)    Chloride 92 (*)    Glucose, Bld 134 (*)    BUN 43 (*)    Creatinine, Ser 1.53 (*)    Calcium 7.8 (*)    Total Protein 5.7 (*)    Albumin 2.4  (*)    AST 251 (*)    ALT 235 (*)    Alkaline Phosphatase 809 (*)    Total Bilirubin 29.9 (*)    GFR calc non Af Amer 46 (*)    GFR calc Af Amer 53 (*)    All other components within normal limits  I-STAT CG4 LACTIC ACID, ED - Abnormal; Notable for the following:    Lactic Acid, Venous 3.75 (*)    All other components within normal limits  CULTURE, BLOOD (ROUTINE X 2)  CULTURE, BLOOD (ROUTINE X 2)  CBC WITH DIFFERENTIAL/PLATELET  URINALYSIS, ROUTINE W REFLEX MICROSCOPIC  RAPID URINE DRUG SCREEN, HOSP PERFORMED  CK  ETHANOL  I-STAT CG4 LACTIC ACID, ED  TYPE AND SCREEN  ABO/RH    EKG  EKG Interpretation  Date/Time:  Friday April 02 2017 21:33:33 EDT Ventricular Rate:  112 PR Interval:    QRS Duration: 88 QT Interval:  355 QTC Calculation: 485 R Axis:   -76 Text Interpretation:  Sinus tachycardia no sig change from baseline Confirmed by Charlesetta Shanks [phone]) on 04/02/2017 11:52:08 PM       Radiology Ct Head Wo Contrast  Result Date: 04/03/2017 CLINICAL DATA:  Found lying on floor, with confusion and incontinence. Initial encounter. EXAM: CT HEAD WITHOUT CONTRAST TECHNIQUE: Contiguous axial images were obtained from the base of the skull through the vertex without intravenous contrast. COMPARISON:  CT of the head performed 05/12/2016 FINDINGS: Brain: No evidence of acute infarction, hemorrhage, hydrocephalus, extra-axial collection or mass lesion/mass effect. Prominence of the ventricles and sulci reflects mild to moderate cortical volume loss. Cerebellar atrophy is noted. Scattered periventricular white matter change likely reflects small vessel ischemic microangiopathy. The brainstem and fourth ventricle are within normal limits. The basal ganglia are unremarkable in appearance. The cerebral hemispheres demonstrate grossly normal gray-white differentiation. No mass effect or midline shift is seen. Vascular: No hyperdense vessel or unexpected calcification. Skull: There is no  evidence of fracture; visualized osseous structures are unremarkable in appearance. Sinuses/Orbits: The orbits are within normal limits. The paranasal sinuses and mastoid air cells are well-aerated. Other: No significant soft tissue abnormalities are seen. IMPRESSION: 1. No evidence of traumatic intracranial injury or fracture. 2. Mild to moderate cortical  volume loss and scattered small vessel ischemic microangiopathy. Electronically Signed   By: Garald Balding M.D.   On: 04/03/2017 00:30   Dg Chest Port 1 View  Result Date: 04/03/2017 CLINICAL DATA:  Found on floor, with incontinence and confusion. Initial encounter. EXAM: PORTABLE CHEST 1 VIEW COMPARISON:  Chest radiograph performed 12/09/2016 FINDINGS: The lungs are hyperexpanded, with flattening of the hemidiaphragms, compatible with COPD. Peribronchial thickening is noted. Mild right perihilar opacity could reflect mild infection. No pleural effusion or pneumothorax is seen. The cardiomediastinal silhouette is within normal limits. No acute osseous abnormalities are seen. IMPRESSION: 1. Mild right perihilar opacity could reflect mild infection, depending on the patient's symptoms. 2. Findings of COPD.  Peribronchial thickening noted. Electronically Signed   By: Garald Balding M.D.   On: 04/03/2017 00:29    Procedures Procedures (including critical care time) Angiocath insertion Performed by: Charlesetta Shanks  Consent: Verbal consent obtained. Risks and benefits: risks, benefits and alternatives were discussed Time out: Immediately prior to procedure a "time out" was called to verify the correct patient, procedure, equipment, support staff and site/side marked as required.  Preparation: Patient was prepped and draped in the usual sterile fashion.  Vein Location: left EJ    Gauge: 18  Normal blood return and flush without difficulty Patient tolerance: Patient tolerated the procedure well with no immediate complications. CRITICAL  CARE Performed by: Charlesetta Shanks   Total critical care time: 45  minutes  Critical care time was exclusive of separately billable procedures and treating other patients.  Critical care was necessary to treat or prevent imminent or life-threatening deterioration.  Critical care was time spent personally by me on the following activities: development of treatment plan with patient and/or surrogate as well as nursing, discussions with consultants, evaluation of patient's response to treatment, examination of patient, obtaining history from patient or surrogate, ordering and performing treatments and interventions, ordering and review of laboratory studies, ordering and review of radiographic studies, pulse oximetry and re-evaluation of patient's condition.   Medications Ordered in ED Medications  pantoprazole (PROTONIX) 80 mg in sodium chloride 0.9 % 250 mL (0.32 mg/mL) infusion (8 mg/hr Intravenous New Bag/Given 04/02/17 2347)  pantoprazole (PROTONIX) injection 40 mg (not administered)  ampicillin-sulbactam (UNASYN) 1.5 g in sodium chloride 0.9 % 50 mL IVPB (not administered)  sodium chloride 0.9 % bolus 500 mL (0 mLs Intravenous Stopped 04/02/17 2225)  pantoprazole (PROTONIX) 80 mg in sodium chloride 0.9 % 100 mL IVPB (0 mg Intravenous Stopped 04/02/17 2334)  sodium chloride 0.9 % bolus 1,000 mL (1,000 mLs Intravenous New Bag/Given 04/03/17 0019)     Initial Impression / Assessment and Plan / ED Course  I have reviewed the triage vital signs and the nursing notes.  Pertinent labs & imaging results that were available during my care of the patient were reviewed by me and considered in my medical decision making (see chart for details).    Consult: Dr. Darnell Level for admission.  Final Clinical Impressions(s) / ED Diagnoses   Final diagnoses:  Acute liver failure without hepatic coma  AKI (acute kidney injury) ([place])  Aspiration pneumonia of right middle lobe, unspecified aspiration pneumonia  type [facility])   Patient has hepatic failure. He had had hepatic failure earlier due to acetaminophen ingestion approximately 3 months ago. At that time that appeared to have resolved. Either the patient has continued to use Tylenol or progressed to hepatic failure as part of the sequelae of initial insult. He became essentially incapacitated in squalid living  conditions. This apparently had happened previously as well. Patient has become more alert and interactive with hydration. He has been requesting water and is not somnolent. He no longer seem significantly confused. Nonetheless, based on severity of hepatic failure and comorbid illness, patient has very poor prognosis. I will be for admission for ongoing treatment and diagnostic evaluation. Chest x-ray shows right-sided infiltrate. With the patient having been incapacitated on the floor I do suspect risk of aspiration pneumonia, Unasyn is initiated. New Prescriptions New Prescriptions   No medications on file     Charlesetta Shanks, MD 04/03/17 [phone]

## 2017-04-02 NOTE — ED Triage Notes (Addendum)
PER EMS: pt from extended stay motel, manager had not seen him "in a couple of days" so she had EMS check on patient. Pt was found laying on the floor, incoherent with blood on the floor. Dark brown/red substance dried to his mouth and chin, pt incontinent of stool and urine. Pt is very jaundiced to skin and sclera. Pt is alert at this time and follows commands but is confused. Abdomen appears distended. CBG-157, RR-38 initially and placed on NRB. 84% on RA and O2 increased to 95% on NRB. HR-120, BP-88/52 automated BP with ems.

## 2017-04-03 ENCOUNTER — Inpatient Hospital Stay (HOSPITAL_COMMUNITY): Payer: Medicare Other

## 2017-04-03 ENCOUNTER — Emergency Department (HOSPITAL_COMMUNITY): Payer: Medicare Other

## 2017-04-03 DIAGNOSIS — C787 Secondary malignant neoplasm of liver and intrahepatic bile duct: Secondary | ICD-10-CM | POA: Diagnosis present

## 2017-04-03 DIAGNOSIS — J439 Emphysema, unspecified: Secondary | ICD-10-CM | POA: Diagnosis not present

## 2017-04-03 DIAGNOSIS — I5022 Chronic systolic (congestive) heart failure: Secondary | ICD-10-CM | POA: Diagnosis not present

## 2017-04-03 DIAGNOSIS — I10 Essential (primary) hypertension: Secondary | ICD-10-CM

## 2017-04-03 DIAGNOSIS — E7800 Pure hypercholesterolemia, unspecified: Secondary | ICD-10-CM

## 2017-04-03 DIAGNOSIS — I428 Other cardiomyopathies: Secondary | ICD-10-CM | POA: Diagnosis present

## 2017-04-03 DIAGNOSIS — R945 Abnormal results of liver function studies: Secondary | ICD-10-CM | POA: Diagnosis not present

## 2017-04-03 DIAGNOSIS — Z720 Tobacco use: Secondary | ICD-10-CM

## 2017-04-03 DIAGNOSIS — R748 Abnormal levels of other serum enzymes: Secondary | ICD-10-CM | POA: Diagnosis not present

## 2017-04-03 DIAGNOSIS — K920 Hematemesis: Secondary | ICD-10-CM | POA: Diagnosis not present

## 2017-04-03 DIAGNOSIS — K7200 Acute and subacute hepatic failure without coma: Secondary | ICD-10-CM | POA: Diagnosis not present

## 2017-04-03 DIAGNOSIS — K869 Disease of pancreas, unspecified: Secondary | ICD-10-CM | POA: Diagnosis not present

## 2017-04-03 DIAGNOSIS — Z7189 Other specified counseling: Secondary | ICD-10-CM | POA: Diagnosis not present

## 2017-04-03 DIAGNOSIS — R64 Cachexia: Secondary | ICD-10-CM | POA: Diagnosis present

## 2017-04-03 DIAGNOSIS — K7689 Other specified diseases of liver: Secondary | ICD-10-CM | POA: Diagnosis not present

## 2017-04-03 DIAGNOSIS — R634 Abnormal weight loss: Secondary | ICD-10-CM | POA: Diagnosis not present

## 2017-04-03 DIAGNOSIS — N179 Acute kidney failure, unspecified: Secondary | ICD-10-CM | POA: Diagnosis not present

## 2017-04-03 DIAGNOSIS — D72825 Bandemia: Secondary | ICD-10-CM

## 2017-04-03 DIAGNOSIS — Z515 Encounter for palliative care: Secondary | ICD-10-CM | POA: Diagnosis not present

## 2017-04-03 DIAGNOSIS — A419 Sepsis, unspecified organism: Secondary | ICD-10-CM | POA: Diagnosis present

## 2017-04-03 DIAGNOSIS — J189 Pneumonia, unspecified organism: Secondary | ICD-10-CM | POA: Diagnosis present

## 2017-04-03 DIAGNOSIS — I2510 Atherosclerotic heart disease of native coronary artery without angina pectoris: Secondary | ICD-10-CM | POA: Diagnosis not present

## 2017-04-03 DIAGNOSIS — C259 Malignant neoplasm of pancreas, unspecified: Secondary | ICD-10-CM | POA: Diagnosis not present

## 2017-04-03 DIAGNOSIS — R7989 Other specified abnormal findings of blood chemistry: Secondary | ICD-10-CM | POA: Diagnosis not present

## 2017-04-03 DIAGNOSIS — J449 Chronic obstructive pulmonary disease, unspecified: Secondary | ICD-10-CM | POA: Diagnosis present

## 2017-04-03 DIAGNOSIS — R4182 Altered mental status, unspecified: Secondary | ICD-10-CM | POA: Diagnosis not present

## 2017-04-03 DIAGNOSIS — Z7982 Long term (current) use of aspirin: Secondary | ICD-10-CM | POA: Diagnosis not present

## 2017-04-03 DIAGNOSIS — I509 Heart failure, unspecified: Secondary | ICD-10-CM | POA: Diagnosis not present

## 2017-04-03 DIAGNOSIS — Z7951 Long term (current) use of inhaled steroids: Secondary | ICD-10-CM | POA: Diagnosis not present

## 2017-04-03 DIAGNOSIS — R042 Hemoptysis: Secondary | ICD-10-CM | POA: Insufficient documentation

## 2017-04-03 DIAGNOSIS — E860 Dehydration: Secondary | ICD-10-CM | POA: Diagnosis not present

## 2017-04-03 DIAGNOSIS — D649 Anemia, unspecified: Secondary | ICD-10-CM

## 2017-04-03 DIAGNOSIS — I2583 Coronary atherosclerosis due to lipid rich plaque: Secondary | ICD-10-CM

## 2017-04-03 DIAGNOSIS — J42 Unspecified chronic bronchitis: Secondary | ICD-10-CM

## 2017-04-03 DIAGNOSIS — R932 Abnormal findings on diagnostic imaging of liver and biliary tract: Secondary | ICD-10-CM | POA: Diagnosis not present

## 2017-04-03 DIAGNOSIS — C801 Malignant (primary) neoplasm, unspecified: Secondary | ICD-10-CM | POA: Diagnosis not present

## 2017-04-03 DIAGNOSIS — I110 Hypertensive heart disease with heart failure: Secondary | ICD-10-CM | POA: Diagnosis present

## 2017-04-03 DIAGNOSIS — R410 Disorientation, unspecified: Secondary | ICD-10-CM | POA: Diagnosis not present

## 2017-04-03 DIAGNOSIS — J9601 Acute respiratory failure with hypoxia: Secondary | ICD-10-CM | POA: Diagnosis not present

## 2017-04-03 DIAGNOSIS — H9190 Unspecified hearing loss, unspecified ear: Secondary | ICD-10-CM | POA: Diagnosis present

## 2017-04-03 DIAGNOSIS — F17210 Nicotine dependence, cigarettes, uncomplicated: Secondary | ICD-10-CM | POA: Diagnosis present

## 2017-04-03 DIAGNOSIS — E43 Unspecified severe protein-calorie malnutrition: Secondary | ICD-10-CM

## 2017-04-03 DIAGNOSIS — K7290 Hepatic failure, unspecified without coma: Secondary | ICD-10-CM | POA: Diagnosis not present

## 2017-04-03 DIAGNOSIS — K7581 Nonalcoholic steatohepatitis (NASH): Secondary | ICD-10-CM | POA: Diagnosis not present

## 2017-04-03 DIAGNOSIS — C799 Secondary malignant neoplasm of unspecified site: Secondary | ICD-10-CM | POA: Diagnosis not present

## 2017-04-03 DIAGNOSIS — Z681 Body mass index (BMI) 19 or less, adult: Secondary | ICD-10-CM | POA: Diagnosis not present

## 2017-04-03 DIAGNOSIS — R933 Abnormal findings on diagnostic imaging of other parts of digestive tract: Secondary | ICD-10-CM | POA: Diagnosis not present

## 2017-04-03 DIAGNOSIS — J690 Pneumonitis due to inhalation of food and vomit: Secondary | ICD-10-CM | POA: Diagnosis not present

## 2017-04-03 DIAGNOSIS — I248 Other forms of acute ischemic heart disease: Secondary | ICD-10-CM | POA: Diagnosis present

## 2017-04-03 DIAGNOSIS — E871 Hypo-osmolality and hyponatremia: Secondary | ICD-10-CM

## 2017-04-03 DIAGNOSIS — E785 Hyperlipidemia, unspecified: Secondary | ICD-10-CM | POA: Diagnosis present

## 2017-04-03 DIAGNOSIS — K831 Obstruction of bile duct: Secondary | ICD-10-CM | POA: Diagnosis not present

## 2017-04-03 LAB — CBC
HCT: 32.2 % — ABNORMAL LOW (ref 39.0–52.0)
HCT: 30.3 % — ABNORMAL LOW (ref 39.0–52.0)
HEMATOCRIT: 27.9 % — AB (ref 39.0–52.0)
HEMATOCRIT: 30.9 % — AB (ref 39.0–52.0)
HEMOGLOBIN: 9.6 g/dL — AB (ref 13.0–17.0)
Hemoglobin: 11 g/dL — ABNORMAL LOW (ref 13.0–17.0)
Hemoglobin: 10.2 g/dL — ABNORMAL LOW (ref 13.0–17.0)
Hemoglobin: 10.4 g/dL — ABNORMAL LOW (ref 13.0–17.0)
MCH: 29.3 pg (ref 26.0–34.0)
MCH: 28.7 pg (ref 26.0–34.0)
MCH: 29.3 pg (ref 26.0–34.0)
MCH: 28.9 pg (ref 26.0–34.0)
MCHC: 34.2 g/dL (ref 30.0–36.0)
MCHC: 33.7 g/dL (ref 30.0–36.0)
MCHC: 34.4 g/dL (ref 30.0–36.0)
MCHC: 33.7 g/dL (ref 30.0–36.0)
MCV: 85.9 fL (ref 78.0–100.0)
MCV: 85.4 fL (ref 78.0–100.0)
MCV: 85.1 fL (ref 78.0–100.0)
MCV: 85.8 fL (ref 78.0–100.0)
PLATELETS: 280 10*3/uL (ref 150–400)
PLATELETS: 282 10*3/uL (ref 150–400)
PLATELETS: 260 10*3/uL (ref 150–400)
Platelets: 259 10*3/uL (ref 150–400)
RBC: 3.75 MIL/uL — AB (ref 4.22–5.81)
RBC: 3.55 MIL/uL — ABNORMAL LOW (ref 4.22–5.81)
RBC: 3.28 MIL/uL — AB (ref 4.22–5.81)
RBC: 3.6 MIL/uL — ABNORMAL LOW (ref 4.22–5.81)
RDW: 16.3 % — AB (ref 11.5–15.5)
RDW: 16 % — AB (ref 11.5–15.5)
RDW: 16.1 % — ABNORMAL HIGH (ref 11.5–15.5)
RDW: 16.1 % — AB (ref 11.5–15.5)
WBC: 16.3 10*3/uL — ABNORMAL HIGH (ref 4.0–10.5)
WBC: 16.8 10*3/uL — AB (ref 4.0–10.5)
WBC: 14.2 10*3/uL — ABNORMAL HIGH (ref 4.0–10.5)
WBC: 12.6 10*3/uL — ABNORMAL HIGH (ref 4.0–10.5)

## 2017-04-03 LAB — CBC WITH DIFFERENTIAL/PLATELET
Basophils Absolute: 0 10*3/uL (ref 0.0–0.1)
Basophils Relative: 0 %
EOS PCT: 0 %
Eosinophils Absolute: 0 10*3/uL (ref 0.0–0.7)
HEMATOCRIT: 34.5 % — AB (ref 39.0–52.0)
Hemoglobin: 11.9 g/dL — ABNORMAL LOW (ref 13.0–17.0)
LYMPHS ABS: 1 10*3/uL (ref 0.7–4.0)
Lymphocytes Relative: 5 %
MCH: 29.2 pg (ref 26.0–34.0)
MCHC: 34.5 g/dL (ref 30.0–36.0)
MCV: 84.8 fL (ref 78.0–100.0)
MONO ABS: 0.8 10*3/uL (ref 0.1–1.0)
Monocytes Relative: 4 %
Neutro Abs: 18.9 10*3/uL — ABNORMAL HIGH (ref 1.7–7.7)
Neutrophils Relative %: 91 %
PLATELETS: 298 10*3/uL (ref 150–400)
RBC: 4.07 MIL/uL — AB (ref 4.22–5.81)
RDW: 15.7 % — AB (ref 11.5–15.5)
WBC: 20.7 10*3/uL — AB (ref 4.0–10.5)

## 2017-04-03 LAB — COMPREHENSIVE METABOLIC PANEL
ALBUMIN: 2.3 g/dL — AB (ref 3.5–5.0)
ALK PHOS: 809 U/L — AB (ref 38–126)
ALK PHOS: 701 U/L — AB (ref 38–126)
ALK PHOS: 644 U/L — AB (ref 38–126)
ALT: 235 U/L — ABNORMAL HIGH (ref 17–63)
ALT: 219 U/L — AB (ref 17–63)
ALT: 209 U/L — ABNORMAL HIGH (ref 17–63)
ANION GAP: 14 (ref 5–15)
ANION GAP: 8 (ref 5–15)
AST: 251 U/L — AB (ref 15–41)
AST: 218 U/L — AB (ref 15–41)
AST: 213 U/L — ABNORMAL HIGH (ref 15–41)
Albumin: 2.4 g/dL — ABNORMAL LOW (ref 3.5–5.0)
Albumin: 2.5 g/dL — ABNORMAL LOW (ref 3.5–5.0)
Anion gap: 11 (ref 5–15)
BILIRUBIN TOTAL: 29.9 mg/dL — AB (ref 0.3–1.2)
BILIRUBIN TOTAL: 28.1 mg/dL — AB (ref 0.3–1.2)
BUN: 43 mg/dL — AB (ref 6–20)
BUN: 48 mg/dL — AB (ref 6–20)
BUN: 35 mg/dL — ABNORMAL HIGH (ref 6–20)
CALCIUM: 7.8 mg/dL — AB (ref 8.9–10.3)
CALCIUM: 7.8 mg/dL — AB (ref 8.9–10.3)
CALCIUM: 7.8 mg/dL — AB (ref 8.9–10.3)
CHLORIDE: 93 mmol/L — AB (ref 101–111)
CO2: 23 mmol/L (ref 22–32)
CO2: 24 mmol/L (ref 22–32)
CO2: 23 mmol/L (ref 22–32)
Chloride: 92 mmol/L — ABNORMAL LOW (ref 101–111)
Chloride: 98 mmol/L — ABNORMAL LOW (ref 101–111)
Creatinine, Ser: 1.53 mg/dL — ABNORMAL HIGH (ref 0.61–1.24)
Creatinine, Ser: 1.41 mg/dL — ABNORMAL HIGH (ref 0.61–1.24)
Creatinine, Ser: 0.84 mg/dL (ref 0.61–1.24)
GFR calc Af Amer: 53 mL/min — ABNORMAL LOW (ref >60)
GFR calc non Af Amer: 50 mL/min — ABNORMAL LOW (ref >60)
GFR, EST AFRICAN AMERICAN: 58 mL/min — AB (ref >60)
GFR, EST NON AFRICAN AMERICAN: 46 mL/min — AB (ref >60)
GLUCOSE: 217 mg/dL — AB (ref 65–99)
GLUCOSE: 109 mg/dL — AB (ref 65–99)
Glucose, Bld: 134 mg/dL — ABNORMAL HIGH (ref 65–99)
POTASSIUM: 3.4 mmol/L — AB (ref 3.5–5.1)
POTASSIUM: 3 mmol/L — AB (ref 3.5–5.1)
Potassium: 3.7 mmol/L (ref 3.5–5.1)
SODIUM: 128 mmol/L — AB (ref 135–145)
Sodium: 129 mmol/L — ABNORMAL LOW (ref 135–145)
Sodium: 129 mmol/L — ABNORMAL LOW (ref 135–145)
TOTAL PROTEIN: 5.7 g/dL — AB (ref 6.5–8.1)
TOTAL PROTEIN: 5.2 g/dL — AB (ref 6.5–8.1)
Total Bilirubin: 30.2 mg/dL (ref 0.3–1.2)
Total Protein: 5.2 g/dL — ABNORMAL LOW (ref 6.5–8.1)

## 2017-04-03 LAB — URINALYSIS, ROUTINE W REFLEX MICROSCOPIC
Bacteria, UA: NONE SEEN
GLUCOSE, UA: NEGATIVE mg/dL
KETONES UR: NEGATIVE mg/dL
LEUKOCYTES UA: NEGATIVE
Nitrite: NEGATIVE
PROTEIN: 30 mg/dL — AB
RBC / HPF: NONE SEEN RBC/hpf (ref 0–5)
Specific Gravity, Urine: 1.014 (ref 1.005–1.030)
Squamous Epithelial / LPF: NONE SEEN
WBC, UA: NONE SEEN WBC/hpf (ref 0–5)
pH: 6 (ref 5.0–8.0)

## 2017-04-03 LAB — IRON AND TIBC
Iron: 22 ug/dL — ABNORMAL LOW (ref 45–182)
Saturation Ratios: 10 % — ABNORMAL LOW (ref 17.9–39.5)
TIBC: 214 ug/dL — AB (ref 250–450)
UIBC: 192 ug/dL

## 2017-04-03 LAB — MAGNESIUM
MAGNESIUM: 1.8 mg/dL (ref 1.7–2.4)
Magnesium: 1.7 mg/dL (ref 1.7–2.4)

## 2017-04-03 LAB — FERRITIN: FERRITIN: 792 ng/mL — AB (ref 24–336)

## 2017-04-03 LAB — PHOSPHORUS
PHOSPHORUS: 6.6 mg/dL — AB (ref 2.5–4.6)
Phosphorus: 6.6 mg/dL — ABNORMAL HIGH (ref 2.5–4.6)

## 2017-04-03 LAB — AMMONIA
Ammonia: 51 umol/L — ABNORMAL HIGH (ref 9–35)
Ammonia: 34 umol/L (ref 9–35)

## 2017-04-03 LAB — PROTIME-INR
INR: 2.36
INR: 2.47
PROTHROMBIN TIME: 27.2 s — AB (ref 11.4–15.2)
Prothrombin Time: 26.2 seconds — ABNORMAL HIGH (ref 11.4–15.2)

## 2017-04-03 LAB — HIV ANTIBODY (ROUTINE TESTING W REFLEX): HIV SCREEN 4TH GENERATION: NONREACTIVE

## 2017-04-03 LAB — APTT: aPTT: 38 seconds — ABNORMAL HIGH (ref 24–36)

## 2017-04-03 LAB — ABO/RH: ABO/RH(D): A POS

## 2017-04-03 LAB — STREP PNEUMONIAE URINARY ANTIGEN: Strep Pneumo Urinary Antigen: NEGATIVE

## 2017-04-03 LAB — RETICULOCYTES
RBC.: 3.75 MIL/uL — ABNORMAL LOW (ref 4.22–5.81)
RETIC CT PCT: 1.3 % (ref 0.4–3.1)
Retic Count, Absolute: 48.8 10*3/uL (ref 19.0–186.0)

## 2017-04-03 LAB — I-STAT CG4 LACTIC ACID, ED: Lactic Acid, Venous: 1.36 mmol/L (ref 0.5–1.9)

## 2017-04-03 LAB — FOLATE: Folate: 13 ng/mL (ref >5.9)

## 2017-04-03 LAB — TSH: TSH: 0.056 u[IU]/mL — AB (ref 0.350–4.500)

## 2017-04-03 LAB — TROPONIN I: TROPONIN I: 0.13 ng/mL — AB (ref <0.03)

## 2017-04-03 LAB — OSMOLALITY, URINE: OSMOLALITY UR: 470 mosm/kg (ref 300–900)

## 2017-04-03 LAB — PROCALCITONIN: PROCALCITONIN: 4.81 ng/mL

## 2017-04-03 LAB — RAPID URINE DRUG SCREEN, HOSP PERFORMED
AMPHETAMINES: NOT DETECTED
BARBITURATES: NOT DETECTED
BENZODIAZEPINES: NOT DETECTED
COCAINE: NOT DETECTED
Opiates: NOT DETECTED
TETRAHYDROCANNABINOL: NOT DETECTED

## 2017-04-03 LAB — VITAMIN B12: Vitamin B-12: 1843 pg/mL — ABNORMAL HIGH (ref 180–914)

## 2017-04-03 LAB — MRSA PCR SCREENING: MRSA BY PCR: INVALID — AB

## 2017-04-03 LAB — CREATININE, URINE, RANDOM: CREATININE, URINE: 51.93 mg/dL

## 2017-04-03 LAB — CK: Total CK: 1450 U/L — ABNORMAL HIGH (ref 49–397)

## 2017-04-03 LAB — SODIUM, URINE, RANDOM: Sodium, Ur: 48 mmol/L

## 2017-04-03 LAB — ETHANOL

## 2017-04-03 LAB — PREALBUMIN: PREALBUMIN: 5.9 mg/dL — AB (ref 18–38)

## 2017-04-03 MED ORDER — NICOTINE 14 MG/24HR TD PT24
14.0000 mg | MEDICATED_PATCH | Freq: Every day | TRANSDERMAL | Status: DC
  Administered 2017-04-03 – 2017-04-07 (×5): 14 mg via TRANSDERMAL
  Filled 2017-04-03 (×5): qty 1

## 2017-04-03 MED ORDER — GUAIFENESIN ER 600 MG PO TB12
1200.0000 mg | ORAL_TABLET | Freq: Two times a day (BID) | ORAL | Status: DC
  Administered 2017-04-03 – 2017-04-07 (×8): 1200 mg via ORAL
  Filled 2017-04-03 (×8): qty 2

## 2017-04-03 MED ORDER — ALBUMIN HUMAN 5 % IV SOLN
12.5000 g | Freq: Once | INTRAVENOUS | Status: AC
  Administered 2017-04-03: 12.5 g via INTRAVENOUS
  Filled 2017-04-03 (×2): qty 250

## 2017-04-03 MED ORDER — GADOBENATE DIMEGLUMINE 529 MG/ML IV SOLN
10.0000 mL | Freq: Once | INTRAVENOUS | Status: AC | PRN
  Administered 2017-04-03: 9 mL via INTRAVENOUS

## 2017-04-03 MED ORDER — SODIUM CHLORIDE 0.9% FLUSH
3.0000 mL | Freq: Two times a day (BID) | INTRAVENOUS | Status: DC
  Administered 2017-04-03 – 2017-04-07 (×10): 3 mL via INTRAVENOUS

## 2017-04-03 MED ORDER — ALBUTEROL SULFATE (2.5 MG/3ML) 0.083% IN NEBU
2.5000 mg | INHALATION_SOLUTION | RESPIRATORY_TRACT | Status: DC | PRN
  Administered 2017-04-04 – 2017-04-07 (×3): 2.5 mg via RESPIRATORY_TRACT
  Filled 2017-04-03 (×3): qty 3

## 2017-04-03 MED ORDER — ONDANSETRON HCL 4 MG/2ML IJ SOLN: 4.0000 mg | Freq: Four times a day (QID) | INTRAMUSCULAR | Status: DC | PRN

## 2017-04-03 MED ORDER — IPRATROPIUM-ALBUTEROL 0.5-2.5 (3) MG/3ML IN SOLN: 3.0000 mL | Freq: Four times a day (QID) | RESPIRATORY_TRACT | Status: DC | PRN

## 2017-04-03 MED ORDER — FOLIC ACID 5 MG/ML IJ SOLN
1.0000 mg | Freq: Every day | INTRAMUSCULAR | Status: DC
  Administered 2017-04-04: 1 mg via INTRAVENOUS
  Filled 2017-04-03 (×3): qty 0.2

## 2017-04-03 MED ORDER — LORAZEPAM 2 MG/ML IJ SOLN
2.0000 mg | INTRAMUSCULAR | Status: DC | PRN
  Administered 2017-04-03 – 2017-04-05 (×2): 2 mg via INTRAVENOUS
  Filled 2017-04-03 (×2): qty 1

## 2017-04-03 MED ORDER — OCTREOTIDE LOAD VIA INFUSION
50.0000 ug | Freq: Once | INTRAVENOUS | Status: AC
  Administered 2017-04-03: 50 ug via INTRAVENOUS
  Filled 2017-04-03: qty 25

## 2017-04-03 MED ORDER — SODIUM CHLORIDE 0.9 % IV SOLN
INTRAVENOUS | Status: AC
  Administered 2017-04-03: 06:00:00 via INTRAVENOUS

## 2017-04-03 MED ORDER — ENSURE ENLIVE PO LIQD
237.0000 mL | Freq: Three times a day (TID) | ORAL | Status: DC
  Administered 2017-04-04 – 2017-04-07 (×5): 237 mL via ORAL

## 2017-04-03 MED ORDER — VANCOMYCIN HCL IN DEXTROSE 750-5 MG/150ML-% IV SOLN
750.0000 mg | INTRAVENOUS | Status: DC
  Administered 2017-04-03 – 2017-04-04 (×2): 750 mg via INTRAVENOUS
  Filled 2017-04-03 (×2): qty 150

## 2017-04-03 MED ORDER — ADULT MULTIVITAMIN W/MINERALS CH
1.0000 | ORAL_TABLET | Freq: Every day | ORAL | Status: DC
  Administered 2017-04-04 – 2017-04-06 (×2): 1 via ORAL
  Filled 2017-04-03 (×2): qty 1

## 2017-04-03 MED ORDER — SODIUM CHLORIDE 0.9 % IV SOLN
1.5000 g | Freq: Once | INTRAVENOUS | Status: DC
  Filled 2017-04-03: qty 1.5

## 2017-04-03 MED ORDER — ONDANSETRON HCL 4 MG PO TABS
4.0000 mg | ORAL_TABLET | Freq: Four times a day (QID) | ORAL | Status: DC | PRN
Start: 2017-04-03 — End: 2017-04-07

## 2017-04-03 MED ORDER — MOMETASONE FURO-FORMOTEROL FUM 100-5 MCG/ACT IN AERO
2.0000 | INHALATION_SPRAY | Freq: Two times a day (BID) | RESPIRATORY_TRACT | Status: DC
  Administered 2017-04-03 – 2017-04-07 (×8): 2 via RESPIRATORY_TRACT
  Filled 2017-04-03 (×2): qty 8.8

## 2017-04-03 MED ORDER — SODIUM CHLORIDE 0.9 % IV SOLN
INTRAVENOUS | Status: AC
  Administered 2017-04-03: 20:00:00 via INTRAVENOUS

## 2017-04-03 MED ORDER — DEXTROSE 5 % IV SOLN
500.0000 mg | INTRAVENOUS | Status: DC
  Administered 2017-04-03 – 2017-04-07 (×5): 500 mg via INTRAVENOUS
  Filled 2017-04-03 (×5): qty 500

## 2017-04-03 MED ORDER — THIAMINE HCL 100 MG/ML IJ SOLN
100.0000 mg | Freq: Every day | INTRAMUSCULAR | Status: DC
  Administered 2017-04-03 – 2017-04-04 (×2): 100 mg via INTRAVENOUS
  Filled 2017-04-03 (×2): qty 2

## 2017-04-03 MED ORDER — SODIUM CHLORIDE 0.9 % IV SOLN
50.0000 ug/h | INTRAVENOUS | Status: DC
  Administered 2017-04-03 – 2017-04-06 (×7): 50 ug/h via INTRAVENOUS
  Filled 2017-04-03 (×14): qty 1

## 2017-04-03 MED ORDER — CEFTRIAXONE SODIUM 1 G IJ SOLR
1.0000 g | INTRAMUSCULAR | Status: DC
  Administered 2017-04-03 – 2017-04-07 (×5): 1 g via INTRAVENOUS
  Filled 2017-04-03 (×5): qty 10

## 2017-04-03 MED ORDER — LACTULOSE 10 GM/15ML PO SOLN
20.0000 g | Freq: Three times a day (TID) | ORAL | Status: DC
  Administered 2017-04-03 – 2017-04-06 (×7): 20 g via ORAL
  Filled 2017-04-03 (×7): qty 30

## 2017-04-03 NOTE — Evaluation (Signed)
Clinical/Bedside Swallow Evaluation Patient Details  Name: Anthony Bolton MRN: [id number] Date of Birth: 09-Oct-1950  Today's Date: 04/03/2017 Time: SLP Start Time (ACUTE ONLY): 7741 SLP Stop Time (ACUTE ONLY): 0943 SLP Time Calculation (min) (ACUTE ONLY): 17 min  Past Medical History:  Past Medical History:  Diagnosis Date  . Adrenal adenoma, left 11/2015   adenoma on 2015 MRI, stable per CT 11/2015.   Marland Kitchen Anemia 09/2016   normocytic.  Hgb 10.2 on 10/25/16  . CHF (congestive heart failure) ([place]) 10/2013   per [facility]: LVEF 20-25% 10/2013, 50-50% 07/2015  . COPD (chronic obstructive pulmonary disease) ([place])   . HLD (hyperlipidemia)   . HOH (hard of hearing)   . Migraines   . Prostate hypertrophy 09/2016   per CT 10/22/16.    . Protein-calorie malnutrition, severe ([place]) 09/2016   adult failure to thrive dx during 09/2016 admission  . SBO (small bowel obstruction) ([place]) 03/2015, 09/2016  . Thrombocytopenia ([place]) 09/2016   platelets 126K 10/25/16.     Past Surgical History:  Past Surgical History:  Procedure Laterality Date  . APPENDECTOMY    . cardiac stents     No stent history  . LEFT HEART CATHETERIZATION WITH CORONARY ANGIOGRAM N/A 11/07/2013   Procedure: LEFT HEART CATHETERIZATION WITH CORONARY ANGIOGRAM;  Surgeon: Clent Demark, MD;  Location: [facility] CATH LAB;  Service: Cardiovascular;  Laterality: N/A;   HPI:  Anthony Bolton a 66 y.o.malewith medical history significant of acetaminophen toxicity in March 2018, COPD, Tobacco abuse, CAD, HTN, anemia, admitted 7/7 after being found down with hypoxia. Patient with hematemesis, AKI, ALF, hyponatremia, and possible PNA (CXR: Mild right perihilar opacity could reflect mild infection).     Assessment / Plan / Recommendation Clinical Impression  Patient presents with oropharyngeal swallow which appears grossly within functional limits at bedside with appearance of adequate airway protection however baseline coughing makes bedside  diagnostics more difficult.  Patient HOH and irritable during evaluation, largely uncooperative with full oral motor exam and with limited po intake.  Patient with prolonged mastication of regular solid, secondary to lack of dentition. Dyspnea on exertion increases risk of aspiration with decreased coordination of breathing and swallow.  Noted mild lingual deviation to the left and moderate dysarthria however noted head CT negative, unclear baseline. Recommend initiating soft diet (dys 3) with thin liquids. SLP will f/u for diet tolerance and need for instrumental exam if coughing persists and appears related to swallowing function.  SLP Visit Diagnosis: Dysphagia, unspecified (R13.10)    Aspiration Risk  Mild aspiration risk    Diet Recommendation Dysphagia 3 (Mech soft);Thin liquid   Liquid Administration via: Cup;Straw Medication Administration: Whole meds with liquid Supervision: Patient able to self feed;Full supervision/cueing for compensatory strategies Compensations: Slow rate;Small sips/bites Postural Changes: Seated upright at 90 degrees    Other  Recommendations Oral Care Recommendations: Oral care BID   Follow up Recommendations None      Frequency and Duration min 2x/week  1 week           Swallow Study   General HPI: Anthony Bolton a 66 y.o.malewith medical history significant of acetaminophen toxicity in March 2018, COPD, Tobacco abuse, CAD, HTN, anemia, admitted 7/7 after being found down with hypoxia. Patient with hematemesis, AKI, ALF, hyponatremia, and possible PNA (CXR: Mild right perihilar opacity could reflect mild infection).   Type of Study: Bedside Swallow Evaluation Previous Swallow Assessment: 11/2016-normal oropharyngeal swallow Diet Prior to this Study: NPO Temperature Spikes Noted: No Respiratory  Status: Nasal cannula History of Recent Intubation: No Behavior/Cognition: Uncooperative Oral Cavity Assessment: Dry Oral Care Completed by SLP:  Yes Oral Cavity - Dentition: Missing dentition Vision: Functional for self-feeding Self-Feeding Abilities: Able to feed self Patient Positioning: Upright in bed Baseline Vocal Quality: Hoarse;Other (comment) (pitch breaks) Volitional Cough: Strong Volitional Swallow: Able to elicit    Oral/Motor/Sensory Function Overall Oral Motor/Sensory Function: Within functional limits (except left lingual deviation)   Ice Chips Ice chips: Not tested   Thin Liquid Thin Liquid: Within functional limits Presentation: Cup;Self Fed;Straw    Nectar Thick Nectar Thick Liquid: Not tested   Honey Thick Honey Thick Liquid: Not tested   Puree Puree: Within functional limits Presentation: Spoon   Solid   GO   Solid: Impaired Oral Phase Impairments: Impaired mastication Oral Phase Functional Implications: Prolonged oral transit       Anthony Honeyman MA, CCC-SLP [phone]  Truong Delcastillo Meryl 04/03/2017,9:53 AM

## 2017-04-03 NOTE — Progress Notes (Signed)
Call for Enviromental service for "BedBug" check.  Spoke with [employer].

## 2017-04-03 NOTE — ED Notes (Signed)
Patient transported to CT 

## 2017-04-03 NOTE — Progress Notes (Signed)
Patient was admitted early this AM after midnight and H and P has been reviewed and I am in agreement with the Assessment and Plan done by Dr. Roel Cluck and additional changes to the plan of care have been made. The patient is a 76 to Male with a PMH of Aceteminophen Toxicity in March 2018, COPD, Tobacco Abuse, CAD, HTN, Anemia and other comorbids who was found on the floor of his Motel Room by the landlord and brought into the ED for evaluation. He was admitted for Liver Failure with Hepatic Encephalopathy along with possible Hematemesis and Severe Dehydration and Sepsis from CAP/Aspiration. He was placed on Abx and had an U/S of the Abdomen which showed a Heterogeneous liver with multiple lesions present, largest of which measures 2.7 x 2.8 x 3.4 cm in the right hepatic lobe. An MRI of the Liver was ordered and Gastroenterology Dr. Teena Irani was consulted for further evaluation and recommendations for Hepatic Failure and ?Hemetemasis. SLP evaluated the patient and recommended Dysphagia 3 Diet. Palliative Care Medicine was consulted for Goals of Care. Patient is severely jaundiced and has a T Bili of 30.2, AST of 218, ALT of 219, and Alk Phos of 701 with a MELD Score of 35. AKI is slightly improved. INR worsened from 2.20 -> 2.36. Gastroenterology recommending obtaining Daily INR and CMP's to follow. Will C/w Octreotide and IV PPI. Patient was also placed on a CIWA Protocol for concern of Withdrawls and a Urinalysis and UDS was ordered. Patient continues to have Acute Respiratory Failure with Hypoxia so will continue Supplemental O2 as patient likely has a PNA. Patient is at high risk of decompensation. Will continue to monitor closely and monitor clinical response to current interventions and repeat bloodwork in AM.

## 2017-04-03 NOTE — ED Notes (Signed)
Patient transported to Ultrasound 

## 2017-04-03 NOTE — Progress Notes (Signed)
Pharmacy Antibiotic Note  Anthony Bolton is a 66 y.o. male admitted on 04/02/2017 with pneumonia.  Pharmacy has been consulted for Vancomycin dosing. WBC elevated at 20.7. Noted renal dysfunction. CXR with opacity.   Plan: Vancomycin 750 mg IV q24h, inc dose if renal function improves Ceftriaxone/Azithromycin per MD Trend WBC, temp, renal function  F/U infectious work-up Drug levels as indicated  Temp (24hrs), Avg:97.8 F (36.6 C), Min:97.8 F (36.6 C), Max:97.8 F (36.6 C)   Recent Labs Lab 04/02/17 2214 04/02/17 2305 04/03/17 0053  WBC  --  20.7*  --   CREATININE  --  1.53*  --   LATICACIDVEN 3.75*  --  1.36    CrCl cannot be calculated (Unknown ideal weight.).    No Known Allergies   Anthony Bolton 04/03/2017 2:57 AM

## 2017-04-03 NOTE — ED Notes (Addendum)
Date and time results received: 04/03/17 0019  Test: bilirubin Critical Value: 29.9  Name of Provider Notified: Pfieffer

## 2017-04-03 NOTE — Progress Notes (Signed)
Initial Nutrition Assessment  DOCUMENTATION CODES:  Severe malnutrition in context of social or environmental circumstances, Underweight  INTERVENTION:  Ensure Enlive po TID, each supplement provides 350 kcal and 20 grams of protein  Recommend vitamin of MD discretion given profound liver, electrolyte, kidney abnormalities.   NUTRITION DIAGNOSIS:  Malnutrition (Severe) related to social / environmental circumstances (Sounds to have self neglect) as evidenced by severe depletion of muscle/fat mass  GOAL:  Patient will meet greater than or equal to 90% of their needs  MONITOR:  PO intake, Supplement acceptance, Diet advancement, Labs, Weight trends, I & O's (Goals/Plan of care)  REASON FOR ASSESSMENT:  Consult "Malnutrition"  ASSESSMENT:  66 y/o male with PMHx acetaminophen toxicity, COPD, Tobacco abuse, CAD, HTN, Anemia. Pt was found lying on floor by Field seismologist after not being seen in a few days. Was found laying on floor, jaundiced, incoherent, blood dried on mouth/chin and incontinent of stool and urine. Work up reveals hepatic lesions on [place], has AKI, Acute liver failure, sepsis, heart failure, CAP, Dehydration, cad.   Pt is poor historian and hard of hearing. RN says he ate well when working with ST this morning. Patient says he will try to eat well. He does not know what his UBW is and could not provide any info on how he is feeling.   Per chart h&p pt had been admitted in March for acetaminophen toxicity. He was recommended to go to a nursing home due to living in squalid conditions. Refused, but psych deemed him incompetent to make decisions. Went to [facility] in April after leaving nursing home, stopped responding to phone calls and declined any help. He lives in squalor. As such, his malnutrition is in context of social/behavioral circumstances.   At this time, pt has a palliative consult. His MELD score is indicative of >50% 3 month mortality. Will provide supplements to try to  help re nourish. Pt was placed on D3, TL by ST  NFPE: Profoundly jaundiced and cachectic. Lower and upper body devoid of all muscle and fat mass. Abdomen is soft, non distended.   Labs: Elevated CK, Bili-30.2, Elevated transaminases, WBC:16.3, Albumin: 2.5,  Prealbumin: 5.9, phos 6.6, Na: 128, Glu: 217, BUN/Creat 48/1.41 Meds: Lactulose, folate, Thiamin, Octreotide, IV ABX, IV PPI,   Diet Order:  Diet NPO time specified  Skin: Profoundly jaundiced  Last BM:  Unknown  Height:  Ht Readings from Last 1 Encounters:  04/03/17 5\' 10"  (1.778 m)   Weight:  Wt Readings from Last 1 Encounters:  04/03/17 97 lb 7.1 oz (44.2 kg)   Wt Readings from Last 10 Encounters:  04/03/17 97 lb 7.1 oz (44.2 kg)  01/14/17 118 lb (53.5 kg)  12/09/16 101 lb 3.2 oz (45.9 kg)  10/22/16 106 lb 11.2 oz (48.4 kg)  10/13/16 110 lb (49.9 kg)  10/07/16 110 lb (49.9 kg)  08/12/16 105 lb (47.6 kg)  12/02/15 102 lb 15.3 oz (46.7 kg)  11/20/15 111 lb (50.3 kg)  10/13/15 120 lb 13 oz (54.8 kg)   Ideal Body Weight:  75.45 kg  BMI:  Body mass index is 13.98 kg/m.  Estimated Nutritional Needs:  Kcal:  >1750 kcals (40 kcal/kg bw) Protein:  >88g Pro (2g/kg bw) Fluid:  Per MD  EDUCATION NEEDS:  No education needs identified at this time  Burtis Junes RD, LDN, [facility] Pager: [phone] 04/03/2017 10:59 AM

## 2017-04-03 NOTE — Progress Notes (Signed)
Message sent to Dr Alfredia Ferguson about continuing NS @ 100cc/hr and no urine output ? In/Out cath for urine retintion.

## 2017-04-03 NOTE — Progress Notes (Signed)
Patient's O2 Sats remain 88% continuous. Paced O2 vis Nasal Canula on patient with O2 Sats now 92%.

## 2017-04-03 NOTE — Consult Note (Addendum)
[facility] Consult Note  Referring Provider: No ref. provider found Primary Care Physician:  Lucianne Lei, MD Primary Gastroenterologist:  Dr.  Laurel Dimmer Complaint: Found in floor poorly responsive questionable blood around the mouth, jaundice HPI: Anthony Bolton is an 66 y.o. white male  with poor living situation recently admitted with acetaminophen overdose, stayed briefly at a nursing home and moved to a motel. Patient Outreach found him in the floor of his hotel room and he was brought to the emergency room vital signs were stable. Labs pertinent for bilirubin 29.9 AST 251 ALT 235 lactic acid 3.75 BUN 43 creatinine 1.53, ammonia level normal, WBC 16.3, INR 2.36. Very poor historian, will arouse and briefly but not respond to my questions  Past Medical History:  Diagnosis Date  . Adrenal adenoma, left 11/2015   adenoma on 2015 MRI, stable per CT 11/2015.   Marland Kitchen Anemia 09/2016   normocytic.  Hgb 10.2 on 10/25/16  . CHF (congestive heart failure) ([place]) 10/2013   per [place]: LVEF 20-25% 10/2013, 50-50% 07/2015  . COPD (chronic obstructive pulmonary disease) ([place])   . HLD (hyperlipidemia)   . HOH (hard of hearing)   . Migraines   . Prostate hypertrophy 09/2016   per CT 10/22/16.    . Protein-calorie malnutrition, severe ([place]) 09/2016   adult failure to thrive dx during 09/2016 admission  . SBO (small bowel obstruction) ([place]) 03/2015, 09/2016  . Thrombocytopenia ([place]) 09/2016   platelets 126K 10/25/16.      Past Surgical History:  Procedure Laterality Date  . APPENDECTOMY    . cardiac stents     No stent history  . LEFT HEART CATHETERIZATION WITH CORONARY ANGIOGRAM N/A 11/07/2013   Procedure: LEFT HEART CATHETERIZATION WITH CORONARY ANGIOGRAM;  Surgeon: Clent Demark, MD;  Location: [facility] CATH LAB;  Service: Cardiovascular;  Laterality: N/A;    Medications Prior to Admission  Medication Sig Dispense Refill  . albuterol (PROVENTIL HFA;VENTOLIN HFA) 108 (90 Base) MCG/ACT inhaler  Inhale 2 puffs into the lungs every 4 (four) hours as needed for wheezing or shortness of breath. 1 Inhaler 0  . aspirin EC 81 MG tablet Take 1 tablet (81 mg total) by mouth daily. 30 tablet 0  . feeding supplement, ENSURE ENLIVE, (ENSURE ENLIVE) LIQD Take 237 mLs by mouth 3 (three) times daily between meals. (Patient not taking: Reported on 01/14/2017) 237 mL 12  . ipratropium-albuterol (DUONEB) 0.5-2.5 (3) MG/3ML SOLN Take 3 mLs by nebulization every 6 (six) hours as needed (wheezing, shortness of breath). (Patient not taking: Reported on 01/14/2017) 360 mL 0  . metoprolol tartrate (LOPRESSOR) 25 MG tablet Take 0.5 tablets (12.5 mg total) by mouth 2 (two) times daily. 60 tablet 0  . mometasone-formoterol (DULERA) 100-5 MCG/ACT AERO Inhale 2 puffs into the lungs 2 (two) times daily at 10 AM and 5 PM. 1 Inhaler 0  . nicotine (NICODERM CQ - DOSED IN MG/24 HOURS) 14 mg/24hr patch Place 1 patch (14 mg total) onto the skin daily. (Patient not taking: Reported on 02/09/2017) 28 patch 0  . pantoprazole (PROTONIX) 40 MG tablet Take 1 tablet (40 mg total) by mouth daily. 30 tablet 1  . polyethylene glycol (MIRALAX / GLYCOLAX) packet Take 17 g by mouth daily. 14 each 0  . predniSONE (DELTASONE) 5 MG tablet 8 tablets for 2 days, 7 tablets for 2 days, 6 tablets for 2 days, 5 tablets for 2 days, 4 tablets for 2 days, 3 tablets for 2 days, 2 tablets for 2 days,  1 tablet for 2 days then discontinue (Patient not taking: Reported on 01/14/2017) 100 tablet 0  . tiotropium (SPIRIVA) 18 MCG inhalation capsule Place 1 capsule (18 mcg total) into inhaler and inhale daily. 30 capsule 0    Allergies: No Known Allergies  Family History  Problem Relation Age of Onset  . Stroke Mother     Social History:  reports that he has been smoking Cigarettes.  He has been smoking about 0.50 packs per day. He has never used smokeless tobacco. He reports that he does not drink alcohol or use drugs.  Review of Systems: negative except  Unobtainable Blood pressure 103/76, pulse 84, temperature 97.8 F (36.6 C), temperature source Oral, resp. rate (!) 26, height _0  (1.778 m), weight 44.2 kg (97 lb 7.1 oz), SpO2 95 %. Head: Normocephalic, without obvious abnormality, atraumatic Neck: no adenopathy, no carotid bruit, no JVD, supple, symmetrical, trachea midline and thyroid not enlarged, symmetric, no tenderness/mass/nodules Resp: clear to auscultation bilaterally Cardio: regular rate and rhythm, S1, S2 normal, no murmur, click, rub or gallop GI: Abdomen soft no obvious tenderness or mass Extremities: extremities normal, atraumatic, no cyanosis or edema  Results for orders placed or performed during the hospital encounter of 04/02/17 (from the past 48 hour(s))  Acetaminophen level     Status: Abnormal   Collection Time: 04/02/17 10:05 PM  Result Value Ref Range   Acetaminophen (Tylenol), Serum <10 (L) 10 - 30 ug/mL    Comment:        THERAPEUTIC CONCENTRATIONS VARY SIGNIFICANTLY. A RANGE OF 10-30 ug/mL MAY BE AN EFFECTIVE CONCENTRATION FOR MANY PATIENTS. HOWEVER, SOME ARE BEST TREATED AT CONCENTRATIONS OUTSIDE THIS RANGE. ACETAMINOPHEN CONCENTRATIONS >150 ug/mL AT 4 HOURS AFTER INGESTION AND >50 ug/mL AT 12 HOURS AFTER INGESTION ARE OFTEN ASSOCIATED WITH TOXIC REACTIONS.   I-Stat CG4 Lactic Acid, ED     Status: Abnormal   Collection Time: 04/02/17 10:14 PM  Result Value Ref Range   Lactic Acid, Venous 3.75 (HH) 0.5 - 1.9 mmol/L   Comment NOTIFIED PHYSICIAN   Protime-INR     Status: Abnormal   Collection Time: 04/02/17 11:05 PM  Result Value Ref Range   Prothrombin Time 24.8 (H) 11.4 - 15.2 seconds   INR 2.20   Ammonia     Status: Abnormal   Collection Time: 04/02/17 11:05 PM  Result Value Ref Range   Ammonia 51 (H) 9 - 35 umol/L  CBC with Differential/Platelet     Status: Abnormal   Collection Time: 04/02/17 11:05 PM  Result Value Ref Range   WBC 20.7 (H) 4.0 - 10.5 K/uL   RBC 4.07 (L) 4.22 - 5.81  MIL/uL   Hemoglobin 11.9 (L) 13.0 - 17.0 g/dL   HCT 34.5 (L) 39.0 - 52.0 %   MCV 84.8 78.0 - 100.0 fL   MCH 29.2 26.0 - 34.0 pg   MCHC 34.5 30.0 - 36.0 g/dL   RDW 15.7 (H) 11.5 - 15.5 %   Platelets 298 150 - 400 K/uL   Neutrophils Relative % 91 %   Lymphocytes Relative 5 %   Monocytes Relative 4 %   Eosinophils Relative 0 %   Basophils Relative 0 %   Neutro Abs 18.9 (H) 1.7 - 7.7 K/uL   Lymphs Abs 1.0 0.7 - 4.0 K/uL   Monocytes Absolute 0.8 0.1 - 1.0 K/uL   Eosinophils Absolute 0.0 0.0 - 0.7 K/uL   Basophils Absolute 0.0 0.0 - 0.1 K/uL   RBC Morphology TARGET CELLS  Comprehensive metabolic panel     Status: Abnormal   Collection Time: 04/02/17 11:05 PM  Result Value Ref Range   Sodium 129 (L) 135 - 145 mmol/L   Potassium 3.4 (L) 3.5 - 5.1 mmol/L   Chloride 92 (L) 101 - 111 mmol/L   CO2 23 22 - 32 mmol/L   Glucose, Bld 134 (H) 65 - 99 mg/dL   BUN 43 (H) 6 - 20 mg/dL   Creatinine, Ser 1.53 (H) 0.61 - 1.24 mg/dL   Calcium 7.8 (L) 8.9 - 10.3 mg/dL   Total Protein 5.7 (L) 6.5 - 8.1 g/dL   Albumin 2.4 (L) 3.5 - 5.0 g/dL   AST 251 (H) 15 - 41 U/L   ALT 235 (H) 17 - 63 U/L   Alkaline Phosphatase 809 (H) 38 - 126 U/L   Total Bilirubin 29.9 (HH) 0.3 - 1.2 mg/dL    Comment: ICTERIC SPECIMEN CRITICAL RESULT CALLED TO, READ BACK BY AND VERIFIED WITH: NUCKLES C,RN 04/03/17 0012 WAYK    GFR calc non Af Amer 46 (L) >60 mL/min   GFR calc Af Amer 53 (L) >60 mL/min    Comment: (NOTE) The eGFR has been calculated using the CKD EPI equation. This calculation has not been validated in all clinical situations. eGFR's persistently <60 mL/min signify possible Chronic Kidney Disease.    Anion gap 14 5 - 15  Type and screen [place]     Status: None   Collection Time: 04/02/17 11:10 PM  Result Value Ref Range   ABO/RH(D) A POS    Antibody Screen NEG    Sample Expiration 04/05/2017   ABO/Rh     Status: None   Collection Time: 04/02/17 11:10 PM  Result Value Ref Range    ABO/RH(D) A POS   I-Stat CG4 Lactic Acid, ED     Status: None   Collection Time: 04/03/17 12:53 AM  Result Value Ref Range   Lactic Acid, Venous 1.36 0.5 - 1.9 mmol/L  Ethanol     Status: None   Collection Time: 04/03/17 12:56 AM  Result Value Ref Range   Alcohol, Ethyl (B) <5 <5 mg/dL    Comment:        LOWEST DETECTABLE LIMIT FOR SERUM ALCOHOL IS 5 mg/dL FOR MEDICAL PURPOSES ONLY   Troponin I     Status: Abnormal   Collection Time: 04/03/17 12:56 AM  Result Value Ref Range   Troponin I 0.13 (HH) <0.03 ng/mL    Comment: CRITICAL RESULT CALLED TO, READ BACK BY AND VERIFIED WITHNeomia Dear RN [phone] 0209 GREEN R   Magnesium     Status: None   Collection Time: 04/03/17 12:56 AM  Result Value Ref Range   Magnesium 1.8 1.7 - 2.4 mg/dL  Phosphorus     Status: Abnormal   Collection Time: 04/03/17 12:56 AM  Result Value Ref Range   Phosphorus 6.6 (H) 2.5 - 4.6 mg/dL  MRSA PCR Screening     Status: Abnormal   Collection Time: 04/03/17  3:30 AM  Result Value Ref Range   MRSA by PCR INVALID RESULTS, SPECIMEN SENT FOR CULTURE (A) NEGATIVE    Comment: M. BROWN [id number] 07.07.2018 N. MORRIS        The GeneXpert MRSA Assay (FDA approved for NASAL specimens only), is one component of a comprehensive MRSA colonization surveillance program. It is not intended to diagnose MRSA infection nor to guide or monitor treatment for MRSA infections.   CK  Status: Abnormal   Collection Time: 04/03/17  5:00 AM  Result Value Ref Range   Total CK 1,450 (H) 49 - 397 U/L  Procalcitonin     Status: None   Collection Time: 04/03/17  5:39 AM  Result Value Ref Range   Procalcitonin 4.81 ng/mL    Comment:        Interpretation: PCT > 2 ng/mL: Systemic infection (sepsis) is likely, unless other causes are known. (NOTE)         ICU PCT Algorithm               Non ICU PCT Algorithm    ----------------------------     ------------------------------         PCT < 0.25 ng/mL                 PCT <  0.1 ng/mL     Stopping of antibiotics            Stopping of antibiotics       strongly encouraged.               strongly encouraged.    ----------------------------     ------------------------------       PCT level decrease by               PCT < 0.25 ng/mL       >= 80% from peak PCT       OR PCT 0.25 - 0.5 ng/mL          Stopping of antibiotics                                             encouraged.     Stopping of antibiotics           encouraged.    ----------------------------     ------------------------------       PCT level decrease by              PCT >= 0.25 ng/mL       < 80% from peak PCT        AND PCT >= 0.5 ng/mL            Continuing antibiotics                                               encouraged.       Continuing antibiotics            encouraged.    ----------------------------     ------------------------------     PCT level increase compared          PCT > 0.5 ng/mL         with peak PCT AND          PCT >= 0.5 ng/mL             Escalation of antibiotics                                          strongly encouraged.      Escalation of antibiotics        strongly encouraged.  Protime-INR     Status: Abnormal   Collection Time: 04/03/17  5:39 AM  Result Value Ref Range   Prothrombin Time 26.2 (H) 11.4 - 15.2 seconds   INR 2.36   APTT     Status: Abnormal   Collection Time: 04/03/17  5:39 AM  Result Value Ref Range   aPTT 38 (H) 24 - 36 seconds    Comment:        IF BASELINE aPTT IS ELEVATED, SUGGEST PATIENT RISK ASSESSMENT BE USED TO DETERMINE APPROPRIATE ANTICOAGULANT THERAPY.   Prealbumin     Status: Abnormal   Collection Time: 04/03/17  5:39 AM  Result Value Ref Range   Prealbumin 5.9 (L) 18 - 38 mg/dL  Vitamin B12     Status: Abnormal   Collection Time: 04/03/17  5:39 AM  Result Value Ref Range   Vitamin B-12 1,843 (H) 180 - 914 pg/mL    Comment: (NOTE) This assay is not validated for testing neonatal or myeloproliferative syndrome  specimens for Vitamin B12 levels.   Folate     Status: None   Collection Time: 04/03/17  5:39 AM  Result Value Ref Range   Folate 13.0 >5.9 ng/mL  Iron and TIBC     Status: Abnormal   Collection Time: 04/03/17  5:39 AM  Result Value Ref Range   Iron 22 (L) 45 - 182 ug/dL   TIBC 214 (L) 250 - 450 ug/dL   Saturation Ratios 10 (L) 17.9 - 39.5 %   UIBC 192 ug/dL  Ferritin     Status: Abnormal   Collection Time: 04/03/17  5:39 AM  Result Value Ref Range   Ferritin 792 (H) 24 - 336 ng/mL  Reticulocytes     Status: Abnormal   Collection Time: 04/03/17  5:39 AM  Result Value Ref Range   Retic Ct Pct 1.3 0.4 - 3.1 %   RBC. 3.75 (L) 4.22 - 5.81 MIL/uL   Retic Count, Absolute 48.8 19.0 - 186.0 K/uL  Ammonia     Status: None   Collection Time: 04/03/17  5:39 AM  Result Value Ref Range   Ammonia 34 9 - 35 umol/L  Magnesium     Status: None   Collection Time: 04/03/17  5:39 AM  Result Value Ref Range   Magnesium 1.7 1.7 - 2.4 mg/dL  Phosphorus     Status: Abnormal   Collection Time: 04/03/17  5:39 AM  Result Value Ref Range   Phosphorus 6.6 (H) 2.5 - 4.6 mg/dL  TSH     Status: Abnormal   Collection Time: 04/03/17  5:39 AM  Result Value Ref Range   TSH 0.056 (L) 0.350 - 4.500 uIU/mL    Comment: Performed by a 3rd Generation assay with a functional sensitivity of <=0.01 uIU/mL.  Comprehensive metabolic panel     Status: Abnormal   Collection Time: 04/03/17  5:39 AM  Result Value Ref Range   Sodium 128 (L) 135 - 145 mmol/L   Potassium 3.7 3.5 - 5.1 mmol/L   Chloride 93 (L) 101 - 111 mmol/L   CO2 24 22 - 32 mmol/L   Glucose, Bld 217 (H) 65 - 99 mg/dL   BUN 48 (H) 6 - 20 mg/dL   Creatinine, Ser 1.41 (H) 0.61 - 1.24 mg/dL   Calcium 7.8 (L) 8.9 - 10.3 mg/dL   Total Protein 5.2 (L) 6.5 - 8.1 g/dL   Albumin 2.5 (L) 3.5 - 5.0 g/dL   AST 218 (H) 15 - 41 U/L  ALT 219 (H) 17 - 63 U/L   Alkaline Phosphatase 701 (H) 38 - 126 U/L   Total Bilirubin 30.2 (HH) 0.3 - 1.2 mg/dL    Comment:  ICTERIC SPECIMEN CRITICAL VALUE NOTED.  VALUE IS CONSISTENT WITH PREVIOUSLY REPORTED AND CALLED VALUE. RESULTS CONFIRMED BY MANUAL DILUTION    GFR calc non Af Amer 50 (L) >60 mL/min   GFR calc Af Amer 58 (L) >60 mL/min    Comment: (NOTE) The eGFR has been calculated using the CKD EPI equation. This calculation has not been validated in all clinical situations. eGFR's persistently <60 mL/min signify possible Chronic Kidney Disease.    Anion gap 11 5 - 15  CBC     Status: Abnormal   Collection Time: 04/03/17  5:39 AM  Result Value Ref Range   WBC 16.3 (H) 4.0 - 10.5 K/uL   RBC 3.75 (L) 4.22 - 5.81 MIL/uL   Hemoglobin 11.0 (L) 13.0 - 17.0 g/dL   HCT 32.2 (L) 39.0 - 52.0 %   MCV 85.9 78.0 - 100.0 fL   MCH 29.3 26.0 - 34.0 pg   MCHC 34.2 30.0 - 36.0 g/dL   RDW 16.3 (H) 11.5 - 15.5 %   Platelets 280 150 - 400 K/uL   Ct Head Wo Contrast  Result Date: 04/03/2017 CLINICAL DATA:  Found lying on floor, with confusion and incontinence. Initial encounter. EXAM: CT HEAD WITHOUT CONTRAST TECHNIQUE: Contiguous axial images were obtained from the base of the skull through the vertex without intravenous contrast. COMPARISON:  CT of the head performed 05/12/2016 FINDINGS: Brain: No evidence of acute infarction, hemorrhage, hydrocephalus, extra-axial collection or mass lesion/mass effect. Prominence of the ventricles and sulci reflects mild to moderate cortical volume loss. Cerebellar atrophy is noted. Scattered periventricular white matter change likely reflects small vessel ischemic microangiopathy. The brainstem and fourth ventricle are within normal limits. The basal ganglia are unremarkable in appearance. The cerebral hemispheres demonstrate grossly normal gray-white differentiation. No mass effect or midline shift is seen. Vascular: No hyperdense vessel or unexpected calcification. Skull: There is no evidence of fracture; visualized osseous structures are unremarkable in appearance. Sinuses/Orbits: The  orbits are within normal limits. The paranasal sinuses and mastoid air cells are well-aerated. Other: No significant soft tissue abnormalities are seen. IMPRESSION: 1. No evidence of traumatic intracranial injury or fracture. 2. Mild to moderate cortical volume loss and scattered small vessel ischemic microangiopathy. Electronically Signed   By: Garald Balding M.D.   On: 04/03/2017 00:30   US Abdomen Complete  Result Date: 04/03/2017 CLINICAL DATA:  Initial evaluation for acute hepatic failure, elevated LFTs, abdominal distension. EXAM: ABDOMEN ULTRASOUND COMPLETE COMPARISON:  None. FINDINGS: Gallbladder: Gallbladder partially contracted without stones or sludge. Gallbladder wall measure within normal limits a 1.5 mm. No free pericholecystic fluid. No sonographic Murphy sign elicited on exam. Common bile duct: Diameter: 7.9 mm. Liver: Heterogeneous with multiple solid lesions present. Most prominent of these present within the right hepatic lobe and measured 2.7 x 2.8 x 3.4 cm. Mild intrahepatic biliary dilatation. IVC: No abnormality visualized. Pancreas: Not visualized on this exam due to overlying bowel gas. Spleen: Poorly visualized due to overlying fluid-filled stomach. Right Kidney: Length: 10.3 cm. Echogenicity within normal limits. No mass or hydronephrosis visualized. Left Kidney: Length: 9.7 cm. Grossly normal, although poorly visualized due to shadowing from overlying bowel gas. Abdominal aorta: No aneurysm visualized. Other findings: None. IMPRESSION: 1. Heterogeneous liver with multiple lesions present, largest of which measures 2.7 x 2.8 x 3.4 cm  in the right hepatic lobe. Further evaluation with dedicated cross-sectional imaging recommended. 2. Mild intra and extrahepatic biliary dilatation. No other acute biliary pathology. 3. No other acute abnormality within the abdomen, with mild limitations as above. Electronically Signed   By: Jeannine Boga M.D.   On: 04/03/2017 02:38   Dg Chest Port  1 View  Result Date: 04/03/2017 CLINICAL DATA:  Found on floor, with incontinence and confusion. Initial encounter. EXAM: PORTABLE CHEST 1 VIEW COMPARISON:  Chest radiograph performed 12/09/2016 FINDINGS: The lungs are hyperexpanded, with flattening of the hemidiaphragms, compatible with COPD. Peribronchial thickening is noted. Mild right perihilar opacity could reflect mild infection. No pleural effusion or pneumothorax is seen. The cardiomediastinal silhouette is within normal limits. No acute osseous abnormalities are seen. IMPRESSION: 1. Mild right perihilar opacity could reflect mild infection, depending on the patient's symptoms. 2. Findings of COPD.  Peribronchial thickening noted. Electronically Signed   By: Garald Balding M.D.   On: 04/03/2017 00:29    Assessment: 1. Questionable upper GI bleeding with blood found around the mouth and the patient found on the floor incontinent of stool and urine 2. Hepatic masses on ultrasound no prior comparison 3. Recent acetaminophen toxicity,  4. Probable sepsis 5. Jaundice, negative alcohol and acetaminophen levels currently Plan:  1. IV PPI and octreotide 2. Await MRI to assess liver lesions 3. Broad-spectrum antibiotics 4. Daily INR and follow creatinine and other parameters of fulminant hepatic failure Kerstyn Coryell C 04/03/2017, 8:53 AM  Pager [phone] If no answer or after 5 PM call [phone]

## 2017-04-03 NOTE — Progress Notes (Signed)
Message sent Dr Alfredia Ferguson with 100cc bladder scan and urine now draining in cath.  Urine to be sent to lab.

## 2017-04-03 NOTE — Progress Notes (Signed)
PT Cancellation Note  Patient Details Name: Anthony Bolton MRN: [id number] DOB: 10/01/1950   Cancelled Treatment:    Reason Eval/Treat Not Completed: Fatigue/lethargy limiting ability to participate.   Patient declined PT today.  Stating "tired" and "do later".  Will return at later date for PT evaluation.   Despina Pole 04/03/2017, 12:03 PM Carita Pian. Sanjuana Kava, [place] Pager [phone]

## 2017-04-03 NOTE — ED Notes (Signed)
Pt's son [phone]  No name. Number obtained from pt's residence by [employer].

## 2017-04-03 NOTE — H&P (Signed)
[name] XMI:[id number] DOB: 09/27/1951 DOA: 04/02/2017     PCP: Lucianne Lei, MD   Outpatient Specialists: none Patient coming from: extended stay Chief Complaint: confusion  HPI: Anthony Bolton is a 66 y.o. male with medical history significant of acetaminophen toxicity in March 2018, COPD, Tobacco abuse, CAD, HTN, anemia    Presented with infusion manager have not seen him for a few days so he called EMS: The patient he was found lying on the Foor and coherent with blood and at his mouth incontinent of stool and urine jaundiced initially hypoxic down to 84% room air put on oxygen with some improvement heart rate 120s BP 88/52. Patient unable to provide his own history. He is very hard of hearing   In the March patient was having headaches and took unclear amount of acetaminophen and insides he was given [place] poison control was consulted hepatitis panel was negative his LFTs were elevated initially but started to go down. Was recommended to go to a nursing home as patient was sleeping and motel squalid conditions but he refused psychiatry evaluated him and felt that he has no capacity and he was discharged to nursing home from where he was discharge in April to a  Motel.  From [facility] for rehab and discharged on 12/30/16. Patient outreach attempted to contact the patient but he repeatedly refused any help initially although later on agreed. In June patient stopped responding to phone calls.   Regarding pertinent Chronic problems:prior CHF (LV systolic function normal on last echo in 2016) Patient was noted to have nonischemic cardiomyopathy back in 2015. He underwent a cardiac catheterization at that time which did not reveal any coronary artery disease. He was placed on ACE inhibitor and beta blocker. His ejection fraction actually improved. Patient has baseline COPD recently he uses inhalers IN ER:  Temp (24hrs), Avg:97.8 F (36.6 C), Min:97.8 F (36.6  C), Max:97.8 F (36.6 C)      on arrival  ED Triage Vitals  Enc Vitals Group     BP 04/02/17 2139 105/79     Pulse Rate 04/02/17 2139 (!) 135     Resp 04/02/17 2139 (!) 30     Temp 04/02/17 2139 97.8 F (36.6 C)     Temp Source 04/02/17 2139 Rectal     SpO2 04/02/17 2134 99 %     Weight --      Height --      Head Circumference --      Peak Flow --      Pain Score --      Pain Loc --      Pain Edu? --      Excl. in [place]? --   RR 19 satting 98% on 4 L HR 95 BP 106/88 INR 2.2 a morning 51 WBC 20.7 hemoglobin 11.9 platelets 298 Sodium 129 potassium 3.4 glucose 134 BUN 43 creatinine 1.53 which is up from baseline of 0.37 protein 5.7 albumin 2.4 AST 251 ALT 235 alkaline phosphatase 809 total bili 29.9 Lactic acid 3.75  Acetaminophen level less than 10  CT head nonacute  Chest x-ray showed a mild right perihilar opacity could be possible mild infection and COPD  Following Medications were ordered   in ER: Medications  pantoprazole (PROTONIX) 80 mg in sodium chloride 0.9 % 250 mL (0.32 mg/mL) infusion (8 mg/hr Intravenous New Bag/Given 04/02/17 2347)  pantoprazole (PROTONIX) injection 40 mg (not administered)  sodium chloride 0.9 % bolus 500  mL (0 mLs Intravenous Stopped 04/02/17 2225)  pantoprazole (PROTONIX) 80 mg in sodium chloride 0.9 % 100 mL IVPB (0 mg Intravenous Stopped 04/02/17 2334)  sodium chloride 0.9 % bolus 1,000 mL (1,000 mLs Intravenous New Bag/Given 04/03/17 0019)      Hospitalist was called for admission for Liver failure  with hepatic encephalopathy possible hemoptysis and severe dehydration  Review of Systems:    Pertinent positives include:confusion  Constitutional:  No weight loss, night sweats, Fevers, chills, fatigue, weight loss  HEENT:  No headaches, Difficulty swallowing,Tooth/dental problems,Sore throat,  No sneezing, itching, ear ache, nasal congestion, post nasal drip,  Cardio-vascular:  No chest pain, Orthopnea, PND, anasarca, dizziness,  palpitations.no Bilateral lower extremity swelling  GI:  No heartburn, indigestion, abdominal pain, nausea, vomiting, diarrhea, change in bowel habits, loss of appetite, melena, blood in stool, hematemesis Resp:  no shortness of breath at rest. No dyspnea on exertion, No excess mucus, no productive cough, No non-productive cough, No coughing up of blood.No change in color of mucus.No wheezing. Skin:  no rash or lesions. No jaundice GU:  no dysuria, change in color of urine, no urgency or frequency. No straining to urinate.  No flank pain.  Musculoskeletal:  No joint pain or no joint swelling. No decreased range of motion. No back pain.  Psych:  No change in mood or affect. No depression or anxiety. No memory loss.  Neuro: no localizing neurological complaints, no tingling, no weakness, no double vision, no gait abnormality, no slurred speech, no confusion  As per HPI otherwise 10 point review of systems negative.   Past Medical History: Past Medical History:  Diagnosis Date  . Adrenal adenoma, left 11/2015   adenoma on 2015 MRI, stable per CT 11/2015.   Marland Kitchen Anemia 09/2016   normocytic.  Hgb 10.2 on 10/25/16  . CHF (congestive heart failure) ([place]) 10/2013   per [place]: LVEF 20-25% 10/2013, 50-50% 07/2015  . COPD (chronic obstructive pulmonary disease) ([place])   . HLD (hyperlipidemia)   . HOH (hard of hearing)   . Migraines   . Prostate hypertrophy 09/2016   per CT 10/22/16.    . Protein-calorie malnutrition, severe ([place]) 09/2016   adult failure to thrive dx during 09/2016 admission  . SBO (small bowel obstruction) ([place]) 03/2015, 09/2016  . Thrombocytopenia ([place]) 09/2016   platelets 126K 10/25/16.     Past Surgical History:  Procedure Laterality Date  . APPENDECTOMY    . cardiac stents     No stent history  . LEFT HEART CATHETERIZATION WITH CORONARY ANGIOGRAM N/A 11/07/2013   Procedure: LEFT HEART CATHETERIZATION WITH CORONARY ANGIOGRAM;  Surgeon: Clent Demark, MD;  Location: [facility] CATH  LAB;  Service: Cardiovascular;  Laterality: N/A;     Social History:  Ambulatory  Independently or  Cane,    reports that he has been smoking Cigarettes.  He has been smoking about 0.50 packs per day. He has never used smokeless tobacco. He reports that he does not drink alcohol or use drugs.  Allergies:  No Known Allergies     Family History:   Family History  Problem Relation Age of Onset  . Stroke Mother     Medications: Prior to Admission medications   Medication Sig Start Date End Date Taking? Authorizing Provider  albuterol (PROVENTIL HFA;VENTOLIN HFA) 108 (90 Base) MCG/ACT inhaler Inhale 2 puffs into the lungs every 4 (four) hours as needed for wheezing or shortness of breath. 09/19/16   Quintella Reichert, MD  aspirin EC 81  MG tablet Take 1 tablet (81 mg total) by mouth daily. 10/26/16   Hongalgi, Lenis Dickinson, MD  feeding supplement, ENSURE ENLIVE, (ENSURE ENLIVE) LIQD Take 237 mLs by mouth 3 (three) times daily between meals. Patient not taking: Reported on 01/14/2017 12/09/16   Reyne Dumas, MD  ipratropium-albuterol (DUONEB) 0.5-2.5 (3) MG/3ML SOLN Take 3 mLs by nebulization every 6 (six) hours as needed (wheezing, shortness of breath). Patient not taking: Reported on 01/14/2017 10/07/16   Orson Eva, MD  metoprolol tartrate (LOPRESSOR) 25 MG tablet Take 0.5 tablets (12.5 mg total) by mouth 2 (two) times daily. 12/09/16   Reyne Dumas, MD  mometasone-formoterol (DULERA) 100-5 MCG/ACT AERO Inhale 2 puffs into the lungs 2 (two) times daily at 10 AM and 5 PM. 12/10/16   Reyne Dumas, MD  nicotine (NICODERM CQ - DOSED IN MG/24 HOURS) 14 mg/24hr patch Place 1 patch (14 mg total) onto the skin daily. Patient not taking: Reported on 02/09/2017 12/10/16   Reyne Dumas, MD  pantoprazole (PROTONIX) 40 MG tablet Take 1 tablet (40 mg total) by mouth daily. 12/10/16   Reyne Dumas, MD  polyethylene glycol (MIRALAX / GLYCOLAX) packet Take 17 g by mouth daily. 12/10/16   Reyne Dumas, MD    predniSONE (DELTASONE) 5 MG tablet 8 tablets for 2 days, 7 tablets for 2 days, 6 tablets for 2 days, 5 tablets for 2 days, 4 tablets for 2 days, 3 tablets for 2 days, 2 tablets for 2 days, 1 tablet for 2 days then discontinue Patient not taking: Reported on 01/14/2017 12/10/16   Reyne Dumas, MD  tiotropium (SPIRIVA) 18 MCG inhalation capsule Place 1 capsule (18 mcg total) into inhaler and inhale daily. 09/19/16   Quintella Reichert, MD    Physical Exam: Patient Vitals for the past 24 hrs:  BP Temp Temp src Pulse Resp SpO2  04/03/17 0015 108/84 - - 99 19 99 %  04/03/17 0000 98/75 - - 94 (!) 25 97 %  04/02/17 2345 99/78 - - (!) 103 20 95 %  04/02/17 2300 106/86 - - (!) 102 (!) 22 100 %  04/02/17 2230 112/88 - - - 17 -  04/02/17 2205 - - - - - 98 %  04/02/17 2200 103/89 - - (!) 113 (!) 26 97 %  04/02/17 2146 (!) 108/91 - - (!) 110 (!) 25 98 %  04/02/17 2139 105/79 97.8 F (36.6 C) Rectal (!) 135 (!) 30 100 %  04/02/17 2134 - - - - - 99 %    1. General:  in No Acute distress 2. Psychological: Alert but not fully Oriented 3. Head/ENT:    Dry Mucous Membranes                             Icteric sclera                          Head Non traumatic, neck supple                            Poor Dentition 4. SKIN:    decreased Skin turgor,  Skin clean Dry and intact no rash, jaundiced  5. Heart: Regular rate and rhythm no Murmur, Rub or gallop 6. Lungs:  no wheezes or crackles   7. Abdomen: Soft,  non-tender,  distended 8. Lower extremities: no clubbing, cyanosis, or edema 9. Neurologically Grossly intact,  moving all 4 extremities equally   10. MSK: Normal range of motion   body mass index is unknown because there is no height or weight on file.  Labs on Admission:   Labs on Admission: I have personally reviewed following labs and imaging studies  CBC:  Recent Labs Lab 04/02/17 2305  WBC 20.7*  NEUTROABS 18.9*  HGB 11.9*  HCT 34.5*  MCV 84.8  PLT 245   Basic Metabolic  Panel:  Recent Labs Lab 04/02/17 2305  NA 129*  K 3.4*  CL 92*  CO2 23  GLUCOSE 134*  BUN 43*  CREATININE 1.53*  CALCIUM 7.8*   GFR: CrCl cannot be calculated (Unknown ideal weight.). Liver Function Tests:  Recent Labs Lab 04/02/17 2305  AST 251*  ALT 235*  ALKPHOS 809*  BILITOT 29.9*  PROT 5.7*  ALBUMIN 2.4*   No results for input(s): LIPASE, AMYLASE in the last 168 hours.  Recent Labs Lab 04/02/17 2305  AMMONIA 51*   Coagulation Profile:  Recent Labs Lab 04/02/17 2305  INR 2.20   Cardiac Enzymes: No results for input(s): CKTOTAL, CKMB, CKMBINDEX, TROPONINI in the last 168 hours. BNP (last 3 results) No results for input(s): PROBNP in the last 8760 hours. HbA1C: No results for input(s): HGBA1C in the last 72 hours. CBG: No results for input(s): GLUCAP in the last 168 hours. Lipid Profile: No results for input(s): CHOL, HDL, LDLCALC, TRIG, CHOLHDL, LDLDIRECT in the last 72 hours. Thyroid Function Tests: No results for input(s): TSH, T4TOTAL, FREET4, T3FREE, THYROIDAB in the last 72 hours. Anemia Panel: No results for input(s): VITAMINB12, FOLATE, FERRITIN, TIBC, IRON, RETICCTPCT in the last 72 hours. Urine analysis:  Sepsis Labs: @LABRCNTIP (procalcitonin:4,lacticidven:4) )No results found for this or any previous visit (from the past 240 hour(s)).    UA ordered  Lab Results  Component Value Date   HGBA1C 5.6 08/12/2015    CrCl cannot be calculated (Unknown ideal weight.).  BNP (last 3 results) No results for input(s): PROBNP in the last 8760 hours.   ECG REPORT  Independently reviewed Rate: 85  Rhythm: Sinus tachycardia ST&T Change: No acute ischemic changes   QTC 485  There were no vitals filed for this visit.   Cultures:    Component Value Date/Time   SDES BLOOD BLOOD RIGHT FOREARM 12/03/2016 1950   SPECREQUEST BOTTLES DRAWN AEROBIC AND ANAEROBIC 5CC EACH 12/03/2016 1950   CULT  12/03/2016 1950    NO GROWTH 5 DAYS Performed  at [facility], [place] [address], [place], [place] [address]    REPTSTATUS 12/08/2016 FINAL 12/03/2016 1950     Radiological Exams on Admission: Ct Head Wo Contrast  Result Date: 04/03/2017 CLINICAL DATA:  Found lying on floor, with confusion and incontinence. Initial encounter. EXAM: CT HEAD WITHOUT CONTRAST TECHNIQUE: Contiguous axial images were obtained from the base of the skull through the vertex without intravenous contrast. COMPARISON:  CT of the head performed 05/12/2016 FINDINGS: Brain: No evidence of acute infarction, hemorrhage, hydrocephalus, extra-axial collection or mass lesion/mass effect. Prominence of the ventricles and sulci reflects mild to moderate cortical volume loss. Cerebellar atrophy is noted. Scattered periventricular white matter change likely reflects small vessel ischemic microangiopathy. The brainstem and fourth ventricle are within normal limits. The basal ganglia are unremarkable in appearance. The cerebral hemispheres demonstrate grossly normal gray-white differentiation. No mass effect or midline shift is seen. Vascular: No hyperdense vessel or unexpected calcification. Skull: There is no evidence of fracture; visualized osseous structures are unremarkable in appearance.  Sinuses/Orbits: The orbits are within normal limits. The paranasal sinuses and mastoid air cells are well-aerated. Other: No significant soft tissue abnormalities are seen. IMPRESSION: 1. No evidence of traumatic intracranial injury or fracture. 2. Mild to moderate cortical volume loss and scattered small vessel ischemic microangiopathy. Electronically Signed   By: Garald Balding M.D.   On: 04/03/2017 00:30   Dg Chest Port 1 View  Result Date: 04/03/2017 CLINICAL DATA:  Found on floor, with incontinence and confusion. Initial encounter. EXAM: PORTABLE CHEST 1 VIEW COMPARISON:  Chest radiograph performed 12/09/2016 FINDINGS: The lungs are hyperexpanded, with flattening of the hemidiaphragms,  compatible with COPD. Peribronchial thickening is noted. Mild right perihilar opacity could reflect mild infection. No pleural effusion or pneumothorax is seen. The cardiomediastinal silhouette is within normal limits. No acute osseous abnormalities are seen. IMPRESSION: 1. Mild right perihilar opacity could reflect mild infection, depending on the patient's symptoms. 2. Findings of COPD.  Peribronchial thickening noted. Electronically Signed   By: Garald Balding M.D.   On: 04/03/2017 00:29    Chart has been reviewed    Assessment/Plan   66 y.o. male with medical history significant of acetaminophen toxicity in March 2018, COPD, Tobacco abuse, CAD, HTN, anemia    Admitted for  Liver failure  with hepatic encephalopathy possible hemoptysis and severe dehydration and sepsis criteria with possible community-acquired pneumonia   Present on Admission: . Hematemesis - in a setting of liver disease will obtain serial CBC, order Protonix IV and octreotide. Will need GI consult in a.m. Marland Kitchen AKI (acute kidney injury) ([place]) possibly secondary to dehydration versus hepatorenal syndrome obtain urine electrolytes and image kidneys . ALF (acute liver failure) - unclear if patient never recovers from initial injury from acetaminophen on March mention that he ever followed up. MELD score 35 with  52.6% Estimated 11-Month Mortality . CAD (nonobstructive per cath 2015) currently asymptomatic will continue to follow hold aspirin . Chronic systolic congestive heart failure, NYHA class 3 ([place]) - last echogram in March 2018 showed EF of 69% grade 1 diastolic dysfunction A she is currently dehydrated would be mindful regarding heart failure. Rehydrate gently . COPD (chronic obstructive pulmonary disease) ([place]) - continue when necessary albuterol and scheduled Atrovent and Mucinex encouraged tobacco cessation . HTN (hypertension) - hold home medications . Hyponatremia -  in the setting of liver failure and dehydration.  We'll obtain urine electrolytes, rehydrate and follow   . Leukocytosis -possibly   hemoconcentration but may be also secondary to underlying infection chest x-ray showing questionable of a tracheal collar antibiotics for now . Tobacco abuse - spoke about importance of quitting and has been counseled . Protein-calorie malnutrition, severe -obtain nutritional consult check magnesium and phosphate check prealbumin . Normocytic anemia - likely we'll were numbers are likely elevated secondary to hemoconcentration expect some drop after resuscitation obtain type and screen follow serial CBC, anemia panel in AM . HLD (hyperlipidemia) given liver failure hold statins . Dehydration -  rehydrate patient may need albumin administration . Acute respiratory failure with hypoxia (HCC) possible pneumonia and chest x-ray likely explains it will administer oxygen admitted to stepdown . Sepsis ([place]) most likely secondary to pneumonia evaluate for ascites if present may need to be evaluated for SBP  . CAP (community acquired pneumonia) covered with with ceftriaxone and azithromycin and vanc    Other plan as per orders.  DVT prophylaxis:  SCD     Code Status:  FULL CODE   as per patient    Family  Communication:   Family not at  Bedside I attempted to discuss care with son who phone but no answer  Disposition Plan:    likely will need placement for rehabilitation                                                Would benefit from PT/OT eval prior to DC    Social Work  Nutrition  consulted                          Consults called: e-link consult PCCM  Admission status: inpatient      Level of care       SDU      I have spent a total of 66 min on this admission  extra time taken to discuss care of the ear link and attempted to get a hold of the family  Gideon 04/03/2017, 1:42 AM    [facility]  Pager [phone]   after 2 AM please page floor coverage PA If 7AM-7PM, please contact the  day team taking care of the patient  [email or web address]  Password [email or web address]

## 2017-04-04 ENCOUNTER — Inpatient Hospital Stay (HOSPITAL_COMMUNITY): Payer: Medicare Other

## 2017-04-04 ENCOUNTER — Encounter (HOSPITAL_COMMUNITY): Payer: Self-pay | Admitting: Radiology

## 2017-04-04 DIAGNOSIS — K8689 Other specified diseases of pancreas: Secondary | ICD-10-CM

## 2017-04-04 DIAGNOSIS — R748 Abnormal levels of other serum enzymes: Secondary | ICD-10-CM

## 2017-04-04 DIAGNOSIS — C787 Secondary malignant neoplasm of liver and intrahepatic bile duct: Secondary | ICD-10-CM

## 2017-04-04 DIAGNOSIS — Z515 Encounter for palliative care: Secondary | ICD-10-CM

## 2017-04-04 DIAGNOSIS — C259 Malignant neoplasm of pancreas, unspecified: Secondary | ICD-10-CM

## 2017-04-04 DIAGNOSIS — R740 Nonspecific elevation of levels of transaminase and lactic acid dehydrogenase [LDH]: Secondary | ICD-10-CM

## 2017-04-04 DIAGNOSIS — K869 Disease of pancreas, unspecified: Secondary | ICD-10-CM

## 2017-04-04 DIAGNOSIS — C799 Secondary malignant neoplasm of unspecified site: Secondary | ICD-10-CM

## 2017-04-04 LAB — COMPREHENSIVE METABOLIC PANEL
ALK PHOS: 673 U/L — AB (ref 38–126)
ALT: 211 U/L — AB (ref 17–63)
AST: 219 U/L — ABNORMAL HIGH (ref 15–41)
Albumin: 2.3 g/dL — ABNORMAL LOW (ref 3.5–5.0)
Anion gap: 7 (ref 5–15)
BUN: 31 mg/dL — ABNORMAL HIGH (ref 6–20)
CALCIUM: 8 mg/dL — AB (ref 8.9–10.3)
CO2: 23 mmol/L (ref 22–32)
CREATININE: 0.76 mg/dL (ref 0.61–1.24)
Chloride: 98 mmol/L — ABNORMAL LOW (ref 101–111)
Glucose, Bld: 134 mg/dL — ABNORMAL HIGH (ref 65–99)
Potassium: 2.9 mmol/L — ABNORMAL LOW (ref 3.5–5.1)
Sodium: 128 mmol/L — ABNORMAL LOW (ref 135–145)
TOTAL PROTEIN: 5 g/dL — AB (ref 6.5–8.1)
Total Bilirubin: 29.4 mg/dL (ref 0.3–1.2)

## 2017-04-04 LAB — CBC WITH DIFFERENTIAL/PLATELET
Basophils Absolute: 0 10*3/uL (ref 0.0–0.1)
Basophils Relative: 0 %
Eosinophils Absolute: 0.2 10*3/uL (ref 0.0–0.7)
Eosinophils Relative: 2 %
HEMATOCRIT: 32.4 % — AB (ref 39.0–52.0)
HEMOGLOBIN: 10.8 g/dL — AB (ref 13.0–17.0)
LYMPHS ABS: 0.8 10*3/uL (ref 0.7–4.0)
LYMPHS PCT: 8 %
MCH: 29 pg (ref 26.0–34.0)
MCHC: 33.3 g/dL (ref 30.0–36.0)
MCV: 86.9 fL (ref 78.0–100.0)
MONOS PCT: 5 %
Monocytes Absolute: 0.5 10*3/uL (ref 0.1–1.0)
NEUTROS PCT: 85 %
Neutro Abs: 8.9 10*3/uL — ABNORMAL HIGH (ref 1.7–7.7)
Platelets: 265 10*3/uL (ref 150–400)
RBC: 3.73 MIL/uL — AB (ref 4.22–5.81)
RDW: 16.3 % — ABNORMAL HIGH (ref 11.5–15.5)
WBC: 10.5 10*3/uL (ref 4.0–10.5)

## 2017-04-04 LAB — LACTIC ACID, PLASMA
Lactic Acid, Venous: 1.1 mmol/L (ref 0.5–1.9)
Lactic Acid, Venous: 0.9 mmol/L (ref 0.5–1.9)

## 2017-04-04 LAB — MRSA CULTURE: CULTURE: NOT DETECTED

## 2017-04-04 LAB — PROTIME-INR
INR: 2.49
Prothrombin Time: 27.4 seconds — ABNORMAL HIGH (ref 11.4–15.2)

## 2017-04-04 LAB — MAGNESIUM: MAGNESIUM: 1.7 mg/dL (ref 1.7–2.4)

## 2017-04-04 LAB — PHOSPHORUS: PHOSPHORUS: 3.2 mg/dL (ref 2.5–4.6)

## 2017-04-04 LAB — GLUCOSE, CAPILLARY: Glucose-Capillary: 172 mg/dL — ABNORMAL HIGH (ref 65–99)

## 2017-04-04 MED ORDER — IOPAMIDOL (ISOVUE-300) INJECTION 61%
INTRAVENOUS | Status: AC
  Administered 2017-04-04: 100 mL
  Filled 2017-04-04: qty 100

## 2017-04-04 MED ORDER — VANCOMYCIN HCL IN DEXTROSE 1-5 GM/200ML-% IV SOLN
1000.0000 mg | INTRAVENOUS | Status: DC
  Administered 2017-04-05 – 2017-04-06 (×2): 1000 mg via INTRAVENOUS
  Filled 2017-04-04 (×2): qty 200

## 2017-04-04 MED ORDER — VITAMIN B-1 100 MG PO TABS
100.0000 mg | ORAL_TABLET | Freq: Every day | ORAL | Status: DC
  Administered 2017-04-06: 100 mg via ORAL
  Filled 2017-04-04: qty 1

## 2017-04-04 MED ORDER — FOLIC ACID 1 MG PO TABS
1.0000 mg | ORAL_TABLET | Freq: Every day | ORAL | Status: DC
  Administered 2017-04-06: 1 mg via ORAL
  Filled 2017-04-04: qty 1

## 2017-04-04 MED ORDER — POTASSIUM CHLORIDE 10 MEQ/100ML IV SOLN
10.0000 meq | INTRAVENOUS | Status: AC
  Administered 2017-04-04 (×4): 10 meq via INTRAVENOUS
  Filled 2017-04-04 (×4): qty 100

## 2017-04-04 NOTE — Progress Notes (Signed)
Pt unable to participate in assessment 2/2 encephalopathy. No family at the bedside. Attempted to reach son, Ahmani Prehn at [phone] ( number listed in demographic section ) and LM. Also per chart review, a son ( no name referenced ) was reached at [phone], and LM here as well. 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 Updated Dr. Alfredia Ferguson. Romona Curls, ANP

## 2017-04-04 NOTE — Progress Notes (Signed)
[facility] Progress Note  Subjective: Patient off ward getting CT scan pancreatic protocol as recommended by MRI findings of possible metastatic disease to the liver with possibility of a pancreatic head adenocarcinoma causing biliary obstruction. No more evidence of GI bleeding according to nurse  Objective: Vital signs in last 24 hours: Temp:  [97.6 F (36.4 C)-98.9 F (37.2 C)] 97.6 F (36.4 C) (07/08 0830) Pulse Rate:  [70-98] 87 (07/08 0830) Resp:  [15-24] 16 (07/08 0830) BP: (89-125)/(65-94) 96/75 (07/08 0830) SpO2:  [92 %-100 %] 94 % (07/08 0830) Weight change:    PE: Deferred  Lab Results: Results for orders placed or performed during the hospital encounter of 04/02/17 (from the past 24 hour(s))  CBC     Status: Abnormal   Collection Time: 04/03/17  4:04 PM  Result Value Ref Range   WBC 14.2 (H) 4.0 - 10.5 K/uL   RBC 3.28 (L) 4.22 - 5.81 MIL/uL   Hemoglobin 9.6 (L) 13.0 - 17.0 g/dL   HCT 27.9 (L) 39.0 - 52.0 %   MCV 85.1 78.0 - 100.0 fL   MCH 29.3 26.0 - 34.0 pg   MCHC 34.4 30.0 - 36.0 g/dL   RDW 16.1 (H) 11.5 - 15.5 %   Platelets 259 150 - 400 K/uL  Strep pneumoniae urinary antigen     Status: None   Collection Time: 04/03/17  5:24 PM  Result Value Ref Range   Strep Pneumo Urinary Antigen NEGATIVE NEGATIVE  Creatinine, urine, random     Status: None   Collection Time: 04/03/17  5:24 PM  Result Value Ref Range   Creatinine, Urine 51.93 mg/dL  Sodium, urine, random     Status: None   Collection Time: 04/03/17  5:24 PM  Result Value Ref Range   Sodium, Ur 48 mmol/L  Osmolality, urine     Status: None   Collection Time: 04/03/17  5:25 PM  Result Value Ref Range   Osmolality, Ur 470 300 - 900 mOsm/kg  Urine rapid drug screen (hosp performed)     Status: None   Collection Time: 04/03/17  5:25 PM  Result Value Ref Range   Opiates NONE DETECTED NONE DETECTED   Cocaine NONE DETECTED NONE DETECTED   Benzodiazepines NONE DETECTED NONE DETECTED    Amphetamines NONE DETECTED NONE DETECTED   Tetrahydrocannabinol NONE DETECTED NONE DETECTED   Barbiturates NONE DETECTED NONE DETECTED  Urinalysis, Routine w reflex microscopic     Status: Abnormal   Collection Time: 04/03/17  5:27 PM  Result Value Ref Range   Color, Urine AMBER (A) YELLOW   APPearance HAZY (A) CLEAR   Specific Gravity, Urine 1.014 1.005 - 1.030   pH 6.0 5.0 - 8.0   Glucose, UA NEGATIVE NEGATIVE mg/dL   Hgb urine dipstick MODERATE (A) NEGATIVE   Bilirubin Urine MODERATE (A) NEGATIVE   Ketones, ur NEGATIVE NEGATIVE mg/dL   Protein, ur 30 (A) NEGATIVE mg/dL   Nitrite NEGATIVE NEGATIVE   Leukocytes, UA NEGATIVE NEGATIVE   RBC / HPF NONE SEEN 0 - 5 RBC/hpf   WBC, UA NONE SEEN 0 - 5 WBC/hpf   Bacteria, UA NONE SEEN NONE SEEN   Squamous Epithelial / LPF NONE SEEN NONE SEEN  CBC     Status: Abnormal   Collection Time: 04/03/17  9:47 PM  Result Value Ref Range   WBC 12.6 (H) 4.0 - 10.5 K/uL   RBC 3.60 (L) 4.22 - 5.81 MIL/uL   Hemoglobin 10.4 (L) 13.0 - 17.0 g/dL  HCT 30.9 (L) 39.0 - 52.0 %   MCV 85.8 78.0 - 100.0 fL   MCH 28.9 26.0 - 34.0 pg   MCHC 33.7 30.0 - 36.0 g/dL   RDW 16.1 (H) 11.5 - 15.5 %   Platelets 260 150 - 400 K/uL  Protime-INR     Status: Abnormal   Collection Time: 04/03/17  9:47 PM  Result Value Ref Range   Prothrombin Time 27.2 (H) 11.4 - 15.2 seconds   INR 2.47   Comprehensive metabolic panel     Status: Abnormal   Collection Time: 04/03/17  9:47 PM  Result Value Ref Range   Sodium 129 (L) 135 - 145 mmol/L   Potassium 3.0 (L) 3.5 - 5.1 mmol/L   Chloride 98 (L) 101 - 111 mmol/L   CO2 23 22 - 32 mmol/L   Glucose, Bld 109 (H) 65 - 99 mg/dL   BUN 35 (H) 6 - 20 mg/dL   Creatinine, Ser 0.84 0.61 - 1.24 mg/dL   Calcium 7.8 (L) 8.9 - 10.3 mg/dL   Total Protein 5.2 (L) 6.5 - 8.1 g/dL   Albumin 2.3 (L) 3.5 - 5.0 g/dL   AST 213 (H) 15 - 41 U/L   ALT 209 (H) 17 - 63 U/L   Alkaline Phosphatase 644 (H) 38 - 126 U/L   Total Bilirubin 28.1 (HH) 0.3 -  1.2 mg/dL   GFR calc non Af Amer >60 >60 mL/min   GFR calc Af Amer >60 >60 mL/min   Anion gap 8 5 - 15  Lactic acid, plasma     Status: None   Collection Time: 04/04/17  3:30 AM  Result Value Ref Range   Lactic Acid, Venous 1.1 0.5 - 1.9 mmol/L  CBC with Differential/Platelet     Status: Abnormal   Collection Time: 04/04/17  3:30 AM  Result Value Ref Range   WBC 10.5 4.0 - 10.5 K/uL   RBC 3.73 (L) 4.22 - 5.81 MIL/uL   Hemoglobin 10.8 (L) 13.0 - 17.0 g/dL   HCT 32.4 (L) 39.0 - 52.0 %   MCV 86.9 78.0 - 100.0 fL   MCH 29.0 26.0 - 34.0 pg   MCHC 33.3 30.0 - 36.0 g/dL   RDW 16.3 (H) 11.5 - 15.5 %   Platelets 265 150 - 400 K/uL   Neutrophils Relative % 85 %   Neutro Abs 8.9 (H) 1.7 - 7.7 K/uL   Lymphocytes Relative 8 %   Lymphs Abs 0.8 0.7 - 4.0 K/uL   Monocytes Relative 5 %   Monocytes Absolute 0.5 0.1 - 1.0 K/uL   Eosinophils Relative 2 %   Eosinophils Absolute 0.2 0.0 - 0.7 K/uL   Basophils Relative 0 %   Basophils Absolute 0.0 0.0 - 0.1 K/uL  Comprehensive metabolic panel     Status: Abnormal   Collection Time: 04/04/17  3:30 AM  Result Value Ref Range   Sodium 128 (L) 135 - 145 mmol/L   Potassium 2.9 (L) 3.5 - 5.1 mmol/L   Chloride 98 (L) 101 - 111 mmol/L   CO2 23 22 - 32 mmol/L   Glucose, Bld 134 (H) 65 - 99 mg/dL   BUN 31 (H) 6 - 20 mg/dL   Creatinine, Ser 0.76 0.61 - 1.24 mg/dL   Calcium 8.0 (L) 8.9 - 10.3 mg/dL   Total Protein 5.0 (L) 6.5 - 8.1 g/dL   Albumin 2.3 (L) 3.5 - 5.0 g/dL   AST 219 (H) 15 - 41 U/L   ALT 211 (  H) 17 - 63 U/L   Alkaline Phosphatase 673 (H) 38 - 126 U/L   Total Bilirubin 29.4 (HH) 0.3 - 1.2 mg/dL   GFR calc non Af Amer >60 >60 mL/min   GFR calc Af Amer >60 >60 mL/min   Anion gap 7 5 - 15  Magnesium     Status: None   Collection Time: 04/04/17  3:30 AM  Result Value Ref Range   Magnesium 1.7 1.7 - 2.4 mg/dL  Phosphorus     Status: None   Collection Time: 04/04/17  3:30 AM  Result Value Ref Range   Phosphorus 3.2 2.5 - 4.6 mg/dL   Lactic acid, plasma     Status: None   Collection Time: 04/04/17  6:03 AM  Result Value Ref Range   Lactic Acid, Venous 0.9 0.5 - 1.9 mmol/L  Protime-INR     Status: Abnormal   Collection Time: 04/04/17  8:53 AM  Result Value Ref Range   Prothrombin Time 27.4 (H) 11.4 - 15.2 seconds   INR 2.49     Studies/Results: Ct Head Wo Contrast  Result Date: 04/03/2017 CLINICAL DATA:  Found lying on floor, with confusion and incontinence. Initial encounter. EXAM: CT HEAD WITHOUT CONTRAST TECHNIQUE: Contiguous axial images were obtained from the base of the skull through the vertex without intravenous contrast. COMPARISON:  CT of the head performed 05/12/2016 FINDINGS: Brain: No evidence of acute infarction, hemorrhage, hydrocephalus, extra-axial collection or mass lesion/mass effect. Prominence of the ventricles and sulci reflects mild to moderate cortical volume loss. Cerebellar atrophy is noted. Scattered periventricular white matter change likely reflects small vessel ischemic microangiopathy. The brainstem and fourth ventricle are within normal limits. The basal ganglia are unremarkable in appearance. The cerebral hemispheres demonstrate grossly normal gray-white differentiation. No mass effect or midline shift is seen. Vascular: No hyperdense vessel or unexpected calcification. Skull: There is no evidence of fracture; visualized osseous structures are unremarkable in appearance. Sinuses/Orbits: The orbits are within normal limits. The paranasal sinuses and mastoid air cells are well-aerated. Other: No significant soft tissue abnormalities are seen. IMPRESSION: 1. No evidence of traumatic intracranial injury or fracture. 2. Mild to moderate cortical volume loss and scattered small vessel ischemic microangiopathy. Electronically Signed   By: Garald Balding M.D.   On: 04/03/2017 00:30   US Abdomen Complete  Result Date: 04/03/2017 CLINICAL DATA:  Initial evaluation for acute hepatic failure, elevated LFTs,  abdominal distension. EXAM: ABDOMEN ULTRASOUND COMPLETE COMPARISON:  None. FINDINGS: Gallbladder: Gallbladder partially contracted without stones or sludge. Gallbladder wall measure within normal limits a 1.5 mm. No free pericholecystic fluid. No sonographic Murphy sign elicited on exam. Common bile duct: Diameter: 7.9 mm. Liver: Heterogeneous with multiple solid lesions present. Most prominent of these present within the right hepatic lobe and measured 2.7 x 2.8 x 3.4 cm. Mild intrahepatic biliary dilatation. IVC: No abnormality visualized. Pancreas: Not visualized on this exam due to overlying bowel gas. Spleen: Poorly visualized due to overlying fluid-filled stomach. Right Kidney: Length: 10.3 cm. Echogenicity within normal limits. No mass or hydronephrosis visualized. Left Kidney: Length: 9.7 cm. Grossly normal, although poorly visualized due to shadowing from overlying bowel gas. Abdominal aorta: No aneurysm visualized. Other findings: None. IMPRESSION: 1. Heterogeneous liver with multiple lesions present, largest of which measures 2.7 x 2.8 x 3.4 cm in the right hepatic lobe. Further evaluation with dedicated cross-sectional imaging recommended. 2. Mild intra and extrahepatic biliary dilatation. No other acute biliary pathology. 3. No other acute abnormality within the abdomen, with  mild limitations as above. Electronically Signed   By: Jeannine Boga M.D.   On: 04/03/2017 02:38   Mr Liver W YB Contrast  Result Date: 04/03/2017 CLINICAL DATA:  Abnormal liver function tests. History drug toxicity in March. Tobacco abuse. Coronary artery disease. Tobacco abuse. CHF. EXAM: MRI ABDOMEN WITHOUT AND WITH CONTRAST TECHNIQUE: Multiplanar multisequence MR imaging of the abdomen was performed both before and after the administration of intravenous contrast. CONTRAST:  9 cc MultiHance COMPARISON:  Ultrasound of earlier today. Chest CT of 12/03/2016. abdominopelvic CT of 10/22/2016. FINDINGS: Moderate motion  degradation throughout. The pre and postcontrast dynamic images are especially motion degraded. Lower chest: Normal heart size without pericardial or pleural effusion. Right lower lobe peribronchovascular interstitial thickening is suboptimally evaluated on image 11/series 3 of. Hepatobiliary: Interval development of multiple T2 hyperintense enhancing liver lesions, consistent with metastatic disease. Examples in the central left hepatic lobe at 2.4 cm on image 7/series 4 and within the posterior right hepatic lobe at 1.9 cm on image 17/series 4. Moderate intrahepatic biliary duct dilatation. The common duct is moderately dilated at 13 mm on image 26/series 3. No choledocholithiasis. Pancreas: Pancreatic atrophy is moderate. Mild pancreatic duct dilatation, including on image 22/series 4. An area of soft tissue fullness which is positioned either cephalad to or less likely exophytic off the pancreatic head measures between 1.8 cm (image 23/series 4) and 2.2 cm on image 54/ series 1002. This causes obstruction of the common duct. No acute pancreatitis. Spleen:  Normal in size, without focal abnormality. Adrenals/Urinary Tract: Normal right adrenal gland. A left adrenal mass measures 4.1 cm and has been present, including back to 05/02/2015. Demonstrate signal dropout on out of phase imaging, consistent with an adenoma. Too small to characterize left renal lesions. Normal right kidney, without hydronephrosis. Stomach/Bowel: Stomach is mildly distended, without cause identified. There are prominent gas-filled loops of small bowel, including image 33/ series 4. The colon is normal in caliber. Vascular/Lymphatic: Aortic and branch vessel atherosclerosis. Portal vein suboptimally evaluated but grossly patent. Suspect portacaval adenopathy as detailed above. Other:  No gross ascites. Musculoskeletal: No acute osseous abnormality. IMPRESSION: 1. Moderate motion degraded exam, especially involving the pre and postcontrast  dynamic images. 2. Interval development of multiple hepatic lesions which are consistent with metastatic disease. 3. Development of biliary duct dilatation, secondary to an area of soft tissue signal which could represent either a portal caval nodal metastasis or an exophytic pancreatic head adenocarcinoma. 4. Patient may benefit from dedicated pre and post contrast abdominal CT, ideally using a pancreatic protocol. 5. Depending on results of this study, consider sampling of liver lesions. If differentiation of nodal metastasis (from a presumed unknown primary) and pancreatic adenocarcinoma is needed, endoscopic ultrasound sampling may be necessary. 6. Right-sided peribronchovascular pulmonary interstitial thickening is nonspecific and may represent infection when correlated with today's chest radiograph. 7. Possible residual or recurrent partial small bowel obstruction, suboptimally evaluated. 8. Left adrenal adenoma. 9.  Aortic Atherosclerosis (ICD10-I70.0). Electronically Signed   By: Abigail Miyamoto M.D.   On: 04/03/2017 19:41   Dg Chest Port 1 View  Result Date: 04/03/2017 CLINICAL DATA:  Found on floor, with incontinence and confusion. Initial encounter. EXAM: PORTABLE CHEST 1 VIEW COMPARISON:  Chest radiograph performed 12/09/2016 FINDINGS: The lungs are hyperexpanded, with flattening of the hemidiaphragms, compatible with COPD. Peribronchial thickening is noted. Mild right perihilar opacity could reflect mild infection. No pleural effusion or pneumothorax is seen. The cardiomediastinal silhouette is within normal limits. No acute osseous abnormalities  are seen. IMPRESSION: 1. Mild right perihilar opacity could reflect mild infection, depending on the patient's symptoms. 2. Findings of COPD.  Peribronchial thickening noted. Electronically Signed   By: Garald Balding M.D.   On: 04/03/2017 00:29      Assessment: 1. Possible/ probable metastatic disease involving the liver with question of a pancreatic  source not ideally characterized by MRI 2. Recent acetaminophen overdose unclear whether residual effects affecting bilirubin versus intra-or extrahepatic biliary obstruction 3. Creatinine improved and INR stable  Plan Await CT pancreatic protocol to better clarify hepato-pancreatic neoplasm source and whether possibly amenable to EUS or other tissue sampling. Will check CA-19-9 GI will continue to follow.    Ruthene Methvin C 04/04/2017, 10:31 AM  Pager [phone] If no answer or after 5 PM call [phone]

## 2017-04-04 NOTE — Progress Notes (Signed)
  Speech Language Pathology Treatment: Dysphagia  Patient Details Name: Anthony Bolton MRN: [id number] DOB: 1950-11-25 Today's Date: 04/04/2017 Time: 4210-3128 SLP Time Calculation (min) (ACUTE ONLY): 12 min  Assessment / Plan / Recommendation Clinical Impression  Pt seen with am meal. Though pt observed to have a baseline cough x2 during assessment, it did not appear related to PO intake. Pt able to masticate soft solids despite lack of dentition with extra time. Seen with consecutive swallows of thin liquid via straw while taking a pill with subjective timely and strong laryngeal elevation and no signs of aspiration. Vocal quality is severely dysphonic which may warrant further work up from ENT. Otherwise SLP will sign off.    HPI HPI: Anthony Bolton a 65 y.o.malewith medical history significant of acetaminophen toxicity in March 2018, COPD, Tobacco abuse, CAD, HTN, anemia, admitted 7/7 after being found down with hypoxia. Patient with hematemesis, AKI, ALF, hyponatremia, and possible PNA (CXR: Mild right perihilar opacity could reflect mild infection).        SLP Plan  All goals met       Recommendations  Diet recommendations: Dysphagia 3 (mechanical soft);Thin liquid Liquids provided via: Cup;Straw Medication Administration: Whole meds with liquid Supervision: Intermittent supervision to cue for compensatory strategies;Staff to assist with self feeding Compensations: Slow rate;Small sips/bites Postural Changes and/or Swallow Maneuvers: Seated upright 90 degrees                Oral Care Recommendations: Oral care BID Follow up Recommendations: None SLP Visit Diagnosis: Dysphagia, unspecified (R13.10) Plan: All goals met       GO                Clarence Cogswell, Katherene Ponto 04/04/2017, 9:22 AM

## 2017-04-04 NOTE — Progress Notes (Signed)
PROGRESS NOTE    Anthony Bolton  XTA:[id number] DOB: Sep 08, 1951 DOA: 04/02/2017 PCP: Lucianne Lei, MD  Brief Narrative: The patient is a 38 to Male with a PMH of Aceteminophen Toxicity in March 2018, COPD, Tobacco Abuse, CAD, HTN, Anemia and other comorbids who was found on the floor of his Motel Room by the landlord and brought into the ED for evaluation. He was admitted for Liver Failure with Hepatic Encephalopathy along with possible Hematemesis and Severe Dehydration and Sepsis from CAP/Aspiration. He was placed on Abx and had an U/S of the Abdomen which showed a Heterogeneous liver with multiple lesions present, largest of which measures 2.7 x 2.8 x 3.4 cm in the right hepatic lobe. An MRI of the Liver was ordered and Gastroenterology Dr. Teena Irani was consulted for further evaluation and recommendations for Hepatic Failure and ?Hemetemasis. SLP evaluated the patient and recommended Dysphagia 3 Diet. Palliative Care Medicine was consulted for Goals of Care. Patient is severely jaundiced and has a T Bili of 30.2, AST of 218, ALT of 219, and Alk Phos of 701 with a MELD Score of 35. AKI is slightly improved. INR worsened from 2.20 -> 2.36. Gastroenterology recommending obtaining Daily INR and CMP's to follow. Will C/w Octreotide and IV PPI. Patient was also placed on a CIWA Protocol for concern of Withdrawls and a Urinalysis and UDS was ordered. Patient continues to have Acute Respiratory Failure with Hypoxia so will continue Supplemental O2 as patient likely has a PNA.  MRI revealed interval development of multiple hepatic lesions which are consistent with metastatic disease and development of biliary duct dilatation 2/2 to an area of soft tissue signal which could represent either a portal caval nodal metastasis or an exophytic pancreatic head adenocarcinoma. CT Chest/Abd/Pelvis w/wo contrast was obtained and showed extensive hepatic metastasis and biliary obstruction 2/2 to a pancreatic head mass,  likely the site of primary malignancy as well as suspected aspiration PNA. Palliative Care is involved and attempted to reach the sons for Goals of Care. Will discuss with Gastroenterology whether the hepato-pancreatic neoplasm is amenable to EUS or tissue sampling and GI checking CA19-9.  Assessment & Plan:   Active Problems:   Hyponatremia   COPD (chronic obstructive pulmonary disease) (HCC)   Tobacco abuse   Chronic systolic congestive heart failure, NYHA class 3 (HCC)   HLD (hyperlipidemia)   CAD (nonobstructive per cath 2015)   HTN (hypertension)   AKI (acute kidney injury) (HCC)   Leukocytosis   Transaminitis   Dehydration   Protein-calorie malnutrition, severe   Acute respiratory failure with hypoxia (HCC)   ALF (acute liver failure)   Elevated troponin   Normocytic anemia   Sepsis ([place])   CAP (community acquired pneumonia)   Hematemesis   Palliative care by specialist   Pancreatic mass   Liver metastases ([place])   Pancreatic cancer metastasized to liver [facility])  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 -CT Chest/Abd/Pelvis w/wo contrast was obtained and showed extensive hepatic metastasis and biliary obstruction 2/2 to a pancreatic head mass, likely the site of primary malignancy as well as suspected aspiration PNA.  -Palliative Care involved and tried to call patient's Son's -Gastroenterology Consulted  and Following  -Will defer to Gastroenterology Dr. Amedeo Plenty about whether lesion is amenable to EUS or tissue sampling -CA 19-9 ordered  Acute respiratory failure with Hypoxia ([place]) Likely from Aspiration [place]  -C/w  Supplemental O2 via [place] and [place] as Tolerated -Continuous Pulse Oximetery and Maintain O2 Saturations > 92%  Sepsis 2/2 to Aspiration PNA -WBC went from 16.3 -> 14.2 -> 12.6 -> 10.5 -Lactic Acid went from 3.75 -> 1.36 -> 0.90 -CT Chest showed Right lower lobe peribronchovascular interstitial opacity with fluid and secretions in the endobronchial tree the. Suspicious for aspiration. Small right pleural effusion is likely secondary. -C/w IV Azithromycin/IV Ceftriaxone and IV Vancomycin and De-escalate -C/w Dulera 2 puff IH BID and DuoNeb 3 mL q6hprn Wheezing and SOB  Hematemesis  -No recurrent Episodes -In a setting of liver disease; C/w Octreotide gtt and Pantoprazole gtt -HB/Hct went from 9.6/27.9 -> 10.4/30.9 -> 10.8/32.4 -Repeat CBC in AM -Gastroenterology following  AKI (acute kidney injury) ([place]) possibly secondary to dehydration versus Hepatorenal syndrome -Improved after IVF -Patient's BUN/Cr went from 48/1.41 -> 35/0.84 -> 31/0.76 -Continue to gently hydrate and repeat CMP in AM  ALF (acute liver failure), worsening  -Likely from Pancreatic Cancer with Mets to the Liver as above -MELD score 35 with  52.6% Estimated 71-Month Mortality -Patient's INR went from 2.20 -> 2.47 -> 2.49 -TBili went from 30.2 -> 28.1 -> 29.4 -AST went from 218 -> 213 -> 219 -ALT went from 219 -> 209 -> 211 -Alk Phos went from 701 -> 644 -> 673 -Was placed on CIWA with Lorazepam for concern of EtOH Component -C/w MVI, Thiamine, and Folic Acid -C/w Lactulose 20 grams TID; Check Ammonia Level in AM -Gastroenterology consulted and following   CAD (nonobstructive per cath 2015) -Currently asymptomatic  -CK was 1450 and Troponin I was 0.13; Likely Demand Ischemia -Will continue to follow trend -ASA Held due to possible Hematemesis  Chronic systolic congestive heart failure, NYHA class 3 (HCC)  -Last ECHOCARDIOGRAM in March 2018 showed EF of 70% grade 1 diastolic dysfunction  -C/w Gentle IVF  rehydration as patient is Dehydrated  COPD (chronic obstructive pulmonary disease) (HCC) - -Continue when necessary Albuterol Nebs and DuoNebs q6hprn -C/w with Dulera and Mucinex 1200 mg po BID  -Encouraged Tobacco Cessation and given Nicotine 14 mg TD patch  HTN (Hypertension) -Hold home medications  Hyponatremia  -In the setting of liver failure and dehydration. -Na+ went from 129 -> 128  -We'll obtain urine electrolytes, rehydrate and follow    Hypokalemia -Patient's K+ was 2.9 -Replete Potassium Chloride IV 40 mEQ and po 40 mEQ  -Repeat CMP in AM  Tobacco Abuse -Smoking Cessation Counseling given -C/w Nicotine Patch 14 mg TD  Protein-calorie malnutrition, severe  -Nutritional Consultation -C/w Ensure Enlive Feeding Supplements TIDwm   Normocytic Anemia -Hb/Hct went from 10.2/30.3 -> 9.6/27.9 -> 10.4/30.9 -> 10.8/32.4 -Repeat CBC in AM and continue to Monitor for S/Sx of bleeding  HLD (hyperlipidemia)  -Given Liver failure will hold statin  DVT prophylaxis: SCD's Code Status: FULL CODE Family Communication: Attempted to reach sons Disposition Plan: SNF vs. Hospice  Consultants:   Gastroenterology  Palliative Care Medicine   Procedures: None  Antimicrobials:  Anti-infectives    Start     Dose/Rate Route Frequency Ordered Stop   04/05/17 0500  vancomycin (VANCOCIN) IVPB 1000 mg/200 mL premix     1,000 mg 200 mL/hr over 60 Minutes Intravenous Every 24 hours 04/04/17 1202     04/03/17 0315  vancomycin (VANCOCIN)  IVPB 750 mg/150 ml premix  Status:  Discontinued     750 mg 150 mL/hr over 60 Minutes Intravenous Every 24 hours 04/03/17 0303 04/04/17 1202   04/03/17 0300  cefTRIAXone (ROCEPHIN) 1 g in dextrose 5 % 50 mL IVPB     1 g 100 mL/hr over 30 Minutes Intravenous Every 24 hours 04/03/17 0255 04/10/17 0259   04/03/17 0300  azithromycin (ZITHROMAX) 500 mg in dextrose 5 % 250 mL IVPB     500 mg 250 mL/hr over 60 Minutes Intravenous Every 24 hours 04/03/17  0255 04/10/17 0259   04/03/17 0100  ampicillin-sulbactam (UNASYN) 1.5 g in sodium chloride 0.9 % 50 mL IVPB  Status:  Discontinued     1.5 g 100 mL/hr over 30 Minutes Intravenous  Once 04/03/17 0039 04/03/17 0057     Subjective: Seen and examined and answered questions but mumbled so it was difficult to understand. Still appeared to be confused slightly and encephalopathic. Denied any complaints at this time.   Objective: Vitals:   04/04/17 1300 04/04/17 1400 04/04/17 1600 04/04/17 1700  BP: 116/81 (!) 120/91 108/81 (!) 117/92  Pulse: 87 91 84 84  Resp: _0 Temp:    98.7 F (37.1 C)  TempSrc:    Oral  SpO2: 97% 96% 98% 98%  Weight:      Height:        Intake/Output Summary (Last 24 hours) at 04/04/17 1925 Last data filed at 04/04/17 1653  Gross per 24 hour  Intake          3396.25 ml  Output             2925 ml  Net           471.25 ml   Filed Weights   04/03/17 0257  Weight: 44.2 kg (97 lb 7.1 oz)   Examination: Physical Exam:  Constitutional: Thin Caucasian male in NAD and appears calm and comfortable Eyes: Lids and conjunctivae normal, sclerae icteric ENMT: External Ears, Nose appear normal. Grossly hard of hearing. Neck: Appears normal, supple, no cervical masses, normal ROM, no appreciable thyromegaly, no JVD Respiratory: Diminished to auscultation bilaterally on Right with some rhonchi. No wheezing/rales/crackles. Normal respiratory effort and patient is not tachypenic. No accessory muscle use. Wearing supplemental O2 Cardiovascular: RRR, no murmurs / rubs / gallops. S1 and S2 auscultated. Mild extremity edema.  Abdomen: Soft, non-tender, non-distended. No masses palpated. No appreciable hepatosplenomegaly. Bowel sounds positive. Patient diffusely yellow on abdomen GU: Deferred. Musculoskeletal: No clubbing / cyanosis of digits/nails. No joint deformity upper and lower extremities. Skin: Whole body jaundice appreciated. No rashes or lesions on a limited  skin eval  Neurologic: CN 2-12 grossly intact with no focal deficits. Romberg sign cerebellar reflexes not assessed.  Psychiatric: Impaired judgment and insight. Alert but not oriented x 3. Normal mood and appropriate affect.   Data Reviewed: I have personally reviewed following labs and imaging studies  CBC:  Recent Labs Lab 04/02/17 2305 04/03/17 0539 04/03/17 1006 04/03/17 1604 04/03/17 2147 04/04/17 0330  WBC 20.7* 16.3* 16.8* 14.2* 12.6* 10.5  NEUTROABS 18.9*  --   --   --   --  8.9*  HGB 11.9* 11.0* 10.2* 9.6* 10.4* 10.8*  HCT 34.5* 32.2* 30.3* 27.9* 30.9* 32.4*  MCV 84.8 85.9 85.4 85.1 85.8 86.9  PLT 298 280 282 259 260 001   Basic Metabolic Panel:  Recent Labs Lab 04/02/17 2305 04/03/17 0056 04/03/17 0539 04/03/17 2147 04/04/17 0330  NA  129*  --  128* 129* 128*  K 3.4*  --  3.7 3.0* 2.9*  CL 92*  --  93* 98* 98*  CO2 23  --  _0 GLUCOSE 134*  --  217* 109* 134*  BUN 43*  --  48* 35* 31*  CREATININE 1.53*  --  1.41* 0.84 0.76  CALCIUM 7.8*  --  7.8* 7.8* 8.0*  MG  --  1.8 1.7  --  1.7  PHOS  --  6.6* 6.6*  --  3.2   GFR: Estimated Creatinine Clearance: 56.8 mL/min (by C-G formula based on SCr of 0.76 mg/dL). Liver Function Tests:  Recent Labs Lab 04/02/17 2305 04/03/17 0539 04/03/17 2147 04/04/17 0330  AST 251* 218* 213* 219*  ALT 235* 219* 209* 211*  ALKPHOS 809* 701* 644* 673*  BILITOT 29.9* 30.2* 28.1* 29.4*  PROT 5.7* 5.2* 5.2* 5.0*  ALBUMIN 2.4* 2.5* 2.3* 2.3*   No results for input(s): LIPASE, AMYLASE in the last 168 hours.  Recent Labs Lab 04/02/17 2305 04/03/17 0539  AMMONIA 51* 34   Coagulation Profile:  Recent Labs Lab 04/02/17 2305 04/03/17 0539 04/03/17 2147 04/04/17 0853  INR 2.20 2.36 2.47 2.49   Cardiac Enzymes:  Recent Labs Lab 04/03/17 0056 04/03/17 0500  CKTOTAL  --  1,450*  TROPONINI 0.13*  --    BNP (last 3 results) No results for input(s): PROBNP in the last 8760 hours. HbA1C: No results for  input(s): HGBA1C in the last 72 hours. CBG: No results for input(s): GLUCAP in the last 168 hours. Lipid Profile: No results for input(s): CHOL, HDL, LDLCALC, TRIG, CHOLHDL, LDLDIRECT in the last 72 hours. Thyroid Function Tests:  Recent Labs  04/03/17 0539  TSH 0.056*   Anemia Panel:  Recent Labs  04/03/17 0539  VITAMINB12 1,843*  FOLATE 13.0  FERRITIN 792*  TIBC 214*  IRON 22*  RETICCTPCT 1.3   Sepsis Labs:  Recent Labs Lab 04/02/17 2214 04/03/17 0053 04/03/17 0539 04/04/17 0330 04/04/17 0603  PROCALCITON  --   --  4.81  --   --   LATICACIDVEN 3.75* 1.36  --  1.1 0.9    Recent Results (from the past 240 hour(s))  Culture, blood (Routine x 2)     Status: None (Preliminary result)   Collection Time: 04/02/17  9:30 PM  Result Value Ref Range Status   Specimen Description BLOOD RIGHT ANTECUBITAL  Final   Special Requests IN PEDIATRIC BOTTLE Blood Culture adequate volume  Final   Culture NO GROWTH 2 DAYS  Final   Report Status PENDING  Incomplete  Culture, blood (Routine x 2)     Status: None (Preliminary result)   Collection Time: 04/02/17  9:55 PM  Result Value Ref Range Status   Specimen Description BLOOD RIGHT HAND  Final   Special Requests IN PEDIATRIC BOTTLE Blood Culture adequate volume  Final   Culture NO GROWTH 2 DAYS  Final   Report Status PENDING  Incomplete  MRSA PCR Screening     Status: Abnormal   Collection Time: 04/03/17  3:30 AM  Result Value Ref Range Status   MRSA by PCR INVALID RESULTS, SPECIMEN SENT FOR CULTURE (A) NEGATIVE Final    Comment: Evern Core [id number] 07.07.2018 N. MORRIS        The GeneXpert MRSA Assay (FDA approved for NASAL specimens only), is one component of a comprehensive MRSA colonization surveillance program. It is not intended to diagnose MRSA infection nor to guide or monitor treatment  for MRSA infections.   MRSA culture     Status: None   Collection Time: 04/03/17  8:39 AM  Result Value Ref Range Status    Specimen Description NASOPHARYNGEAL  Final   Special Requests NONE  Final   Culture NO MRSA DETECTED  Final   Report Status 04/04/2017 FINAL  Final    Radiology Studies: Ct Abdomen Pelvis W Wo Contrast  Result Date: 04/04/2017 CLINICAL DATA:  Multiple liver lesions suspicious for metastasis. Biliary obstruction. EXAM: CT CHEST WITH CONTRAST CT ABDOMEN AND PELVIS WITH AND WITHOUT CONTRAST TECHNIQUE: Multidetector CT imaging of the chest was performed during intravenous contrast administration. Multidetector CT imaging of the abdomen and pelvis was performed following the standard protocol before and during bolus administration of intravenous contrast. CONTRAST:  166m ISOVUE-300 IOPAMIDOL (ISOVUE-300) INJECTION 61% COMPARISON:  MRI of 1 day prior. Chest CT 12/03/2016. abdominopelvic CT of 10/22/2016. FINDINGS: CT CHEST FINDINGS Cardiovascular: Aortic and branch vessel atherosclerosis. Normal heart size, without pericardial effusion. Multivessel coronary artery atherosclerosis. No central pulmonary embolism, on this non-dedicated study. Mediastinum/Nodes: No supraclavicular adenopathy. No mediastinal or hilar adenopathy. Lungs/Pleura: Small right pleural effusion. Fluid or debris in the endobronchial tree including the right lower lobe. Moderate centrilobular emphysema. Right lower lobe peribronchovascular interstitial thickening is new since the prior exam, including on image 34/series 10. Clear left lung. Musculoskeletal: No acute osseous abnormality. CT ABDOMEN AND PELVIS FINDINGS Hepatobiliary: Multiple peripherally enhancing liver lesions, as on MRI. Gallbladder not well evaluated but grossly normal. Moderate intra and extrahepatic biliary ductal dilatation to the level of distal common duct obstruction, including on image 166/series 11. Pancreas: Pancreatic atrophy and duct dilatation within the body and tail are moderate. Soft tissue fullness along the cephalad aspect the pancreatic head is favored to be  arising from the pancreas. Example at 2.8 x 2.1 cm on image 166/series 11. No acute pancreatitis. Spleen: Normal in size, without focal abnormality. Adrenals/Urinary Tract: Left adrenal adenoma, as on prior. Normal right adrenal gland. Too small to characterize left renal lesions. Normal right kidney, without hydronephrosis. Normal urinary bladder. Stomach/Bowel: Mild gastric distension, without cause identified. The colon is normal in caliber. Mid small bowel loops are dilated, including at 5.4 cm on image 224/series 11. Solid stool like material at point of apparent transition, including on image 128/series 11. No pneumatosis or free intraperitoneal air. Vascular/Lymphatic: Advanced aortic and branch vessel atherosclerosis. Portal and splenic veins patent. No abdominopelvic adenopathy. Reproductive: Normal prostate. Other: Trace perihepatic ascites. Musculoskeletal: Degenerative disc disease at the lumbosacral junction. IMPRESSION: 1. Extensive hepatic metastasis, as on MRI. 2. Biliary obstruction secondary to a pancreatic head mass, likely the site of primary malignancy. 3. Right lower lobe peribronchovascular interstitial opacity with fluid and secretions in the endobronchial tree the. Suspicious for aspiration. Small right pleural effusion is likely secondary. 4. Coronary artery atherosclerosis. Aortic Atherosclerosis (ICD10-I70.0). 5. Findings again suspicious for partial small bowel obstruction. Question internal hernia, given appearance of transition and similarity to the 10/22/2016 exam. 6. Small volume abdominal ascites. 7. Left adrenal adenoma. 8.  Emphysema (ICD10-J43.9). Electronically Signed   By: KAbigail MiyamotoM.D.   On: 04/04/2017 11:23   Ct Head Wo Contrast  Result Date: 04/03/2017 CLINICAL DATA:  Found lying on floor, with confusion and incontinence. Initial encounter. EXAM: CT HEAD WITHOUT CONTRAST TECHNIQUE: Contiguous axial images were obtained from the base of the skull through the vertex  without intravenous contrast. COMPARISON:  CT of the head performed 05/12/2016 FINDINGS: Brain: No evidence of acute infarction,  hemorrhage, hydrocephalus, extra-axial collection or mass lesion/mass effect. Prominence of the ventricles and sulci reflects mild to moderate cortical volume loss. Cerebellar atrophy is noted. Scattered periventricular white matter change likely reflects small vessel ischemic microangiopathy. The brainstem and fourth ventricle are within normal limits. The basal ganglia are unremarkable in appearance. The cerebral hemispheres demonstrate grossly normal gray-white differentiation. No mass effect or midline shift is seen. Vascular: No hyperdense vessel or unexpected calcification. Skull: There is no evidence of fracture; visualized osseous structures are unremarkable in appearance. Sinuses/Orbits: The orbits are within normal limits. The paranasal sinuses and mastoid air cells are well-aerated. Other: No significant soft tissue abnormalities are seen. IMPRESSION: 1. No evidence of traumatic intracranial injury or fracture. 2. Mild to moderate cortical volume loss and scattered small vessel ischemic microangiopathy. Electronically Signed   By: Garald Balding M.D.   On: 04/03/2017 00:30   Ct Chest W Contrast  Result Date: 04/04/2017 CLINICAL DATA:  Multiple liver lesions suspicious for metastasis. Biliary obstruction. EXAM: CT CHEST WITH CONTRAST CT ABDOMEN AND PELVIS WITH AND WITHOUT CONTRAST TECHNIQUE: Multidetector CT imaging of the chest was performed during intravenous contrast administration. Multidetector CT imaging of the abdomen and pelvis was performed following the standard protocol before and during bolus administration of intravenous contrast. CONTRAST:  174m ISOVUE-300 IOPAMIDOL (ISOVUE-300) INJECTION 61% COMPARISON:  MRI of 1 day prior. Chest CT 12/03/2016. abdominopelvic CT of 10/22/2016. FINDINGS: CT CHEST FINDINGS Cardiovascular: Aortic and branch vessel atherosclerosis.  Normal heart size, without pericardial effusion. Multivessel coronary artery atherosclerosis. No central pulmonary embolism, on this non-dedicated study. Mediastinum/Nodes: No supraclavicular adenopathy. No mediastinal or hilar adenopathy. Lungs/Pleura: Small right pleural effusion. Fluid or debris in the endobronchial tree including the right lower lobe. Moderate centrilobular emphysema. Right lower lobe peribronchovascular interstitial thickening is new since the prior exam, including on image 34/series 10. Clear left lung. Musculoskeletal: No acute osseous abnormality. CT ABDOMEN AND PELVIS FINDINGS Hepatobiliary: Multiple peripherally enhancing liver lesions, as on MRI. Gallbladder not well evaluated but grossly normal. Moderate intra and extrahepatic biliary ductal dilatation to the level of distal common duct obstruction, including on image 166/series 11. Pancreas: Pancreatic atrophy and duct dilatation within the body and tail are moderate. Soft tissue fullness along the cephalad aspect the pancreatic head is favored to be arising from the pancreas. Example at 2.8 x 2.1 cm on image 166/series 11. No acute pancreatitis. Spleen: Normal in size, without focal abnormality. Adrenals/Urinary Tract: Left adrenal adenoma, as on prior. Normal right adrenal gland. Too small to characterize left renal lesions. Normal right kidney, without hydronephrosis. Normal urinary bladder. Stomach/Bowel: Mild gastric distension, without cause identified. The colon is normal in caliber. Mid small bowel loops are dilated, including at 5.4 cm on image 224/series 11. Solid stool like material at point of apparent transition, including on image 128/series 11. No pneumatosis or free intraperitoneal air. Vascular/Lymphatic: Advanced aortic and branch vessel atherosclerosis. Portal and splenic veins patent. No abdominopelvic adenopathy. Reproductive: Normal prostate. Other: Trace perihepatic ascites. Musculoskeletal: Degenerative disc  disease at the lumbosacral junction. IMPRESSION: 1. Extensive hepatic metastasis, as on MRI. 2. Biliary obstruction secondary to a pancreatic head mass, likely the site of primary malignancy. 3. Right lower lobe peribronchovascular interstitial opacity with fluid and secretions in the endobronchial tree the. Suspicious for aspiration. Small right pleural effusion is likely secondary. 4. Coronary artery atherosclerosis. Aortic Atherosclerosis (ICD10-I70.0). 5. Findings again suspicious for partial small bowel obstruction. Question internal hernia, given appearance of transition and similarity to the 10/22/2016 exam. 6.  Small volume abdominal ascites. 7. Left adrenal adenoma. 8.  Emphysema (ICD10-J43.9). Electronically Signed   By: Abigail Miyamoto M.D.   On: 04/04/2017 11:23   US Abdomen Complete  Result Date: 04/03/2017 CLINICAL DATA:  Initial evaluation for acute hepatic failure, elevated LFTs, abdominal distension. EXAM: ABDOMEN ULTRASOUND COMPLETE COMPARISON:  None. FINDINGS: Gallbladder: Gallbladder partially contracted without stones or sludge. Gallbladder wall measure within normal limits a 1.5 mm. No free pericholecystic fluid. No sonographic Murphy sign elicited on exam. Common bile duct: Diameter: 7.9 mm. Liver: Heterogeneous with multiple solid lesions present. Most prominent of these present within the right hepatic lobe and measured 2.7 x 2.8 x 3.4 cm. Mild intrahepatic biliary dilatation. IVC: No abnormality visualized. Pancreas: Not visualized on this exam due to overlying bowel gas. Spleen: Poorly visualized due to overlying fluid-filled stomach. Right Kidney: Length: 10.3 cm. Echogenicity within normal limits. No mass or hydronephrosis visualized. Left Kidney: Length: 9.7 cm. Grossly normal, although poorly visualized due to shadowing from overlying bowel gas. Abdominal aorta: No aneurysm visualized. Other findings: None. IMPRESSION: 1. Heterogeneous liver with multiple lesions present, largest of  which measures 2.7 x 2.8 x 3.4 cm in the right hepatic lobe. Further evaluation with dedicated cross-sectional imaging recommended. 2. Mild intra and extrahepatic biliary dilatation. No other acute biliary pathology. 3. No other acute abnormality within the abdomen, with mild limitations as above. Electronically Signed   By: Jeannine Boga M.D.   On: 04/03/2017 02:38   Mr Liver W MP Contrast  Result Date: 04/03/2017 CLINICAL DATA:  Abnormal liver function tests. History drug toxicity in March. Tobacco abuse. Coronary artery disease. Tobacco abuse. CHF. EXAM: MRI ABDOMEN WITHOUT AND WITH CONTRAST TECHNIQUE: Multiplanar multisequence MR imaging of the abdomen was performed both before and after the administration of intravenous contrast. CONTRAST:  9 cc MultiHance COMPARISON:  Ultrasound of earlier today. Chest CT of 12/03/2016. abdominopelvic CT of 10/22/2016. FINDINGS: Moderate motion degradation throughout. The pre and postcontrast dynamic images are especially motion degraded. Lower chest: Normal heart size without pericardial or pleural effusion. Right lower lobe peribronchovascular interstitial thickening is suboptimally evaluated on image 11/series 3 of. Hepatobiliary: Interval development of multiple T2 hyperintense enhancing liver lesions, consistent with metastatic disease. Examples in the central left hepatic lobe at 2.4 cm on image 7/series 4 and within the posterior right hepatic lobe at 1.9 cm on image 17/series 4. Moderate intrahepatic biliary duct dilatation. The common duct is moderately dilated at 13 mm on image 26/series 3. No choledocholithiasis. Pancreas: Pancreatic atrophy is moderate. Mild pancreatic duct dilatation, including on image 22/series 4. An area of soft tissue fullness which is positioned either cephalad to or less likely exophytic off the pancreatic head measures between 1.8 cm (image 23/series 4) and 2.2 cm on image 54/ series 1002. This causes obstruction of the common  duct. No acute pancreatitis. Spleen:  Normal in size, without focal abnormality. Adrenals/Urinary Tract: Normal right adrenal gland. A left adrenal mass measures 4.1 cm and has been present, including back to 05/02/2015. Demonstrate signal dropout on out of phase imaging, consistent with an adenoma. Too small to characterize left renal lesions. Normal right kidney, without hydronephrosis. Stomach/Bowel: Stomach is mildly distended, without cause identified. There are prominent gas-filled loops of small bowel, including image 33/ series 4. The colon is normal in caliber. Vascular/Lymphatic: Aortic and branch vessel atherosclerosis. Portal vein suboptimally evaluated but grossly patent. Suspect portacaval adenopathy as detailed above. Other:  No gross ascites. Musculoskeletal: No acute osseous abnormality. IMPRESSION: 1. Moderate  motion degraded exam, especially involving the pre and postcontrast dynamic images. 2. Interval development of multiple hepatic lesions which are consistent with metastatic disease. 3. Development of biliary duct dilatation, secondary to an area of soft tissue signal which could represent either a portal caval nodal metastasis or an exophytic pancreatic head adenocarcinoma. 4. Patient may benefit from dedicated pre and post contrast abdominal CT, ideally using a pancreatic protocol. 5. Depending on results of this study, consider sampling of liver lesions. If differentiation of nodal metastasis (from a presumed unknown primary) and pancreatic adenocarcinoma is needed, endoscopic ultrasound sampling may be necessary. 6. Right-sided peribronchovascular pulmonary interstitial thickening is nonspecific and may represent infection when correlated with today's chest radiograph. 7. Possible residual or recurrent partial small bowel obstruction, suboptimally evaluated. 8. Left adrenal adenoma. 9.  Aortic Atherosclerosis (ICD10-I70.0). Electronically Signed   By: Abigail Miyamoto M.D.   On: 04/03/2017  19:41   Dg Chest Port 1 View  Result Date: 04/03/2017 CLINICAL DATA:  Found on floor, with incontinence and confusion. Initial encounter. EXAM: PORTABLE CHEST 1 VIEW COMPARISON:  Chest radiograph performed 12/09/2016 FINDINGS: The lungs are hyperexpanded, with flattening of the hemidiaphragms, compatible with COPD. Peribronchial thickening is noted. Mild right perihilar opacity could reflect mild infection. No pleural effusion or pneumothorax is seen. The cardiomediastinal silhouette is within normal limits. No acute osseous abnormalities are seen. IMPRESSION: 1. Mild right perihilar opacity could reflect mild infection, depending on the patient's symptoms. 2. Findings of COPD.  Peribronchial thickening noted. Electronically Signed   By: Garald Balding M.D.   On: 04/03/2017 00:29   Scheduled Meds: . feeding supplement (ENSURE ENLIVE)  237 mL Oral TID BM  . [START ON 05/02/8849] folic acid  1 mg Oral Daily  . guaiFENesin  1,200 mg Oral BID  . lactulose  20 g Oral TID  . mometasone-formoterol  2 puff Inhalation BID  . multivitamin with minerals  1 tablet Oral Daily  . nicotine  14 mg Transdermal Daily  . [START ON 04/06/2017] pantoprazole  40 mg Intravenous Q12H  . sodium chloride flush  3 mL Intravenous Q12H  . [START ON 04/05/2017] thiamine  100 mg Oral Daily   Continuous Infusions: . azithromycin Stopped (04/04/17 0403)  . cefTRIAXone (ROCEPHIN)  IV Stopped (04/04/17 0333)  . octreotide  (SANDOSTATIN)    IV infusion 50 mcg/hr (04/04/17 1712)  . pantoprozole (PROTONIX) infusion 8 mg/hr (04/04/17 1712)  . potassium chloride 10 mEq (04/04/17 1855)  . [START ON 04/05/2017] vancomycin      LOS: 1 day   TOTAL CARE TIME: [place], [place] [facility] Pager [phone]  If 7PM-7AM, please contact night-coverage [email or web address] Password [email or web address] 04/04/2017, 7:25 PM

## 2017-04-04 NOTE — Plan of Care (Signed)
Problem: Bowel/Gastric: Goal: Will not experience complications related to bowel motility Outcome: Progressing Discussed with patient about laxative medications with some teach back displayed

## 2017-04-04 NOTE — Evaluation (Signed)
Physical Therapy Evaluation Patient Details Name: DAVIONE LENKER MRN: [id number] DOB: [birth year] Today's Date: 04/04/2017   History of Present Illness  Patient is a 66 yo male admitted 04/02/17 after being found down in hotel room.  Patient with severe dehydration, AKI, liver disease, hepatic encephalopathy, hypotension       PMH:  COPD, tobacco use, CAD, HTN, anemia, NICM, CHF, HOH  Clinical Impression  Patient presents with problems listed below.  Will benefit from acute PT to maximize functional mobility prior to discharge.  Today patient required +2 mod-max assist for bed mobility.  Patient with decreased strength and activity tolerance, could only sit for 6 minutes.  Recommend SNF at d/c for continued therapy.    Follow Up Recommendations SNF;Supervision for mobility/OOB    Equipment Recommendations  Other (comment) (TBD with increased mobility)    Recommendations for Other Services       Precautions / Restrictions Precautions Precautions: Fall Restrictions Weight Bearing Restrictions: No      Mobility  Bed Mobility Overal bed mobility: Needs Assistance Bed Mobility: Rolling;Sidelying to Sit;Sit to Supine Rolling: Min assist Sidelying to sit: Mod assist;+2 for physical assistance   Sit to supine: Max assist;+2 for physical assistance   General bed mobility comments: Verbal cues for technique.  Mod assist to bring trunk to upright sitting position.  Once upright, patient with heavy reliance on UE's to maintain balance.  Patient able to sit EOB x 6 minutes in flexed position with head down.  Cues to hold head up and sit with upright posture.  Patient fatigued quickly.  Returned to supine with max assist.   Transfers                 General transfer comment: Unable  Ambulation/Gait                Stairs            Wheelchair Mobility    Modified Rankin (Stroke Patients Only)       Balance Overall balance assessment: Needs  assistance Sitting-balance support: Bilateral upper extremity supported;Feet supported Sitting balance-Leahy Scale: Poor                                       Pertinent Vitals/Pain Pain Assessment: Faces Faces Pain Scale: No hurt (Patient shaking head "no" when asked about pain.)    Home Living Family/patient expects to be discharged to:: Shelter/Homeless Living Arrangements: Alone Available Help at Discharge: Other (Comment) (Unknown, has friend and son listed as contacts) Type of Home: Other(Comment) (Hotel room)         Home Equipment: Kasandra Knudsen - single point (In chart, note states patient walks with cane or on his own) Additional Comments: Patient unable to provide information.  Confusion, and difficulty understanding patient's speech.    Prior Function Level of Independence: Independent         Comments: Patient did not answer any questions regarding PLOF.     Hand Dominance        Extremity/Trunk Assessment   Upper Extremity Assessment Upper Extremity Assessment: Generalized weakness    Lower Extremity Assessment Lower Extremity Assessment: Generalized weakness       Communication   Communication: HOH;Expressive difficulties  Cognition Arousal/Alertness: Awake/alert Behavior During Therapy: Impulsive (Irritated with questions.) Overall Cognitive Status: Difficult to assess  General Comments: Patient appears disoriented.  Is able to follow simple commands.        General Comments      Exercises     Assessment/Plan    PT Assessment Patient needs continued PT services  PT Problem List Decreased strength;Decreased activity tolerance;Decreased balance;Decreased mobility;Decreased cognition;Decreased knowledge of use of DME;Decreased safety awareness;Cardiopulmonary status limiting activity       PT Treatment Interventions DME instruction;Gait training;Functional mobility training;Therapeutic  activities;Therapeutic exercise;Balance training;Cognitive remediation;Patient/family education    PT Goals (Current goals can be found in the Care Plan section)  Acute Rehab PT Goals Patient Stated Goal: None stated PT Goal Formulation: Patient unable to participate in goal setting Time For Goal Achievement: 04/18/17 Potential to Achieve Goals: Fair    Frequency Min 3X/week   Barriers to discharge Inaccessible home environment;Decreased caregiver support Patient homeless, and with no caregivers.    Co-evaluation               AM-PAC PT "6 Clicks" Daily Activity  Outcome Measure Difficulty turning over in bed (including adjusting bedclothes, sheets and blankets)?: Total Difficulty moving from lying on back to sitting on the side of the bed? : Total Difficulty sitting down on and standing up from a chair with arms (e.g., wheelchair, bedside commode, etc,.)?: Total Help needed moving to and from a bed to chair (including a wheelchair)?: A Lot Help needed walking in hospital room?: A Lot Help needed climbing 3-5 steps with a railing? : Total 6 Click Score: 8    End of Session Equipment Utilized During Treatment: Oxygen Activity Tolerance: Patient limited by fatigue Patient left: in bed;with call bell/phone within reach;with bed alarm set Nurse Communication: Mobility status PT Visit Diagnosis: Difficulty in walking, not elsewhere classified (R26.2);Muscle weakness (generalized) (M62.81)    Time: 5427-0623 PT Time Calculation (min) (ACUTE ONLY): 19 min   Charges:   PT Evaluation $PT Eval High Complexity: 1 Procedure     PT G Codes:        Carita Pian. Sanjuana Kava, [facility] Acute Rehab Services Pager [place] 04/04/2017, 6:47 PM

## 2017-04-05 ENCOUNTER — Other Ambulatory Visit: Payer: Self-pay | Admitting: *Deleted

## 2017-04-05 DIAGNOSIS — K7200 Acute and subacute hepatic failure without coma: Secondary | ICD-10-CM

## 2017-04-05 DIAGNOSIS — K7682 Hepatic encephalopathy: Secondary | ICD-10-CM

## 2017-04-05 DIAGNOSIS — R000 Tachycardia, unspecified: Secondary | ICD-10-CM

## 2017-04-05 LAB — PROTIME-INR
INR: 2.28
INR: 1.95
PROTHROMBIN TIME: 25.5 s — AB (ref 11.4–15.2)
Prothrombin Time: 22.5 seconds — ABNORMAL HIGH (ref 11.4–15.2)

## 2017-04-05 LAB — RENAL FUNCTION PANEL
ALBUMIN: 2.1 g/dL — AB (ref 3.5–5.0)
ANION GAP: 8 (ref 5–15)
BUN: 11 mg/dL (ref 6–20)
CHLORIDE: 95 mmol/L — AB (ref 101–111)
CO2: 26 mmol/L (ref 22–32)
Calcium: 7.6 mg/dL — ABNORMAL LOW (ref 8.9–10.3)
Creatinine, Ser: 0.58 mg/dL — ABNORMAL LOW (ref 0.61–1.24)
GFR calc Af Amer: >60 mL/min (ref >60)
GFR calc non Af Amer: >60 mL/min (ref >60)
GLUCOSE: 195 mg/dL — AB (ref 65–99)
PHOSPHORUS: 2.4 mg/dL — AB (ref 2.5–4.6)
Potassium: 2.6 mmol/L — CL (ref 3.5–5.1)
Sodium: 129 mmol/L — ABNORMAL LOW (ref 135–145)

## 2017-04-05 LAB — CBC WITH DIFFERENTIAL/PLATELET
Basophils Absolute: 0 10*3/uL (ref 0.0–0.1)
Basophils Relative: 0 %
Eosinophils Absolute: 0.3 10*3/uL (ref 0.0–0.7)
Eosinophils Relative: 3 %
HEMATOCRIT: 30.1 % — AB (ref 39.0–52.0)
HEMOGLOBIN: 10.1 g/dL — AB (ref 13.0–17.0)
Lymphocytes Relative: 10 %
Lymphs Abs: 0.8 10*3/uL (ref 0.7–4.0)
MCH: 29 pg (ref 26.0–34.0)
MCHC: 33.6 g/dL (ref 30.0–36.0)
MCV: 86.5 fL (ref 78.0–100.0)
MONO ABS: 0.4 10*3/uL (ref 0.1–1.0)
MONOS PCT: 5 %
NEUTROS ABS: 6.6 10*3/uL (ref 1.7–7.7)
NEUTROS PCT: 82 %
Platelets: 238 10*3/uL (ref 150–400)
RBC: 3.48 MIL/uL — ABNORMAL LOW (ref 4.22–5.81)
RDW: 16.2 % — ABNORMAL HIGH (ref 11.5–15.5)
WBC: 8 10*3/uL (ref 4.0–10.5)

## 2017-04-05 LAB — MAGNESIUM: Magnesium: 1.4 mg/dL — ABNORMAL LOW (ref 1.7–2.4)

## 2017-04-05 LAB — HEPATIC FUNCTION PANEL
ALK PHOS: 735 U/L — AB (ref 38–126)
ALT: 209 U/L — AB (ref 17–63)
AST: 187 U/L — AB (ref 15–41)
Albumin: 2.1 g/dL — ABNORMAL LOW (ref 3.5–5.0)
BILIRUBIN DIRECT: 19.6 mg/dL — AB (ref 0.1–0.5)
Indirect Bilirubin: 9.7 mg/dL — ABNORMAL HIGH (ref 0.3–0.9)
TOTAL PROTEIN: 4.9 g/dL — AB (ref 6.5–8.1)
Total Bilirubin: 29.3 mg/dL (ref 0.3–1.2)

## 2017-04-05 LAB — AMMONIA: Ammonia: 66 umol/L — ABNORMAL HIGH (ref 9–35)

## 2017-04-05 LAB — CANCER ANTIGEN 19-9: CAN 19-9: 1335 U/mL — AB (ref 0–35)

## 2017-04-05 MED ORDER — POTASSIUM PHOSPHATES 15 MMOLE/5ML IV SOLN
30.0000 meq | Freq: Once | INTRAVENOUS | Status: AC
  Administered 2017-04-05: 30 meq via INTRAVENOUS
  Filled 2017-04-05: qty 6.82

## 2017-04-05 MED ORDER — POTASSIUM CHLORIDE 10 MEQ/100ML IV SOLN
10.0000 meq | INTRAVENOUS | Status: AC
  Administered 2017-04-05 (×4): 10 meq via INTRAVENOUS
  Filled 2017-04-05 (×4): qty 100

## 2017-04-05 MED ORDER — MAGNESIUM SULFATE IN D5W 1-5 GM/100ML-% IV SOLN
1.0000 g | Freq: Once | INTRAVENOUS | Status: AC
  Administered 2017-04-05: 1 g via INTRAVENOUS
  Filled 2017-04-05: qty 100

## 2017-04-05 MED ORDER — SODIUM CHLORIDE 0.9 % IV BOLUS (SEPSIS)
500.0000 mL | Freq: Once | INTRAVENOUS | Status: AC
  Administered 2017-04-05: 500 mL via INTRAVENOUS

## 2017-04-05 MED ORDER — MAGNESIUM SULFATE 2 GM/50ML IV SOLN
2.0000 g | Freq: Once | INTRAVENOUS | Status: AC
  Administered 2017-04-05: 2 g via INTRAVENOUS
  Filled 2017-04-05: qty 50

## 2017-04-05 NOTE — Plan of Care (Signed)
Problem: Safety: Goal: Ability to remain free from injury will improve Outcome: Progressing Pt is still impulsive and requires pt safety mittens to prevent him from pulling at lines and equipment

## 2017-04-05 NOTE — Progress Notes (Signed)
OT Cancellation Note  Patient Details Name: Anthony Bolton MRN: [id number] DOB: 1951/06/30   Cancelled Treatment:    Reason Eval/Treat Not Completed: Medical issues which prohibited therapy. Spoke with RN--pt K+ 2.6 this AM and they are giving him runs of K+ now and pt is agitated--pt not good to see today.   Almon Register [phone] 04/05/2017, 12:09 PM

## 2017-04-05 NOTE — Progress Notes (Signed)
PROGRESS NOTE    Anthony Bolton  QTM:[id number] DOB: 07-Aug-1951 DOA: 04/02/2017 PCP: Lucianne Lei, MD  Brief Narrative: The patient is a 45 to Male with a PMH of Aceteminophen Toxicity in March 2018, COPD, Tobacco Abuse, CAD, HTN, Anemia and other comorbids who was found on the floor of his Motel Room by the landlord and brought into the ED for evaluation. He was admitted for Liver Failure with Hepatic Encephalopathy along with possible Hematemesis and Severe Dehydration and Sepsis from CAP/Aspiration. He was placed on Abx and had an U/S of the Abdomen which showed a Heterogeneous liver with multiple lesions present, largest of which measures 2.7 x 2.8 x 3.4 cm in the right hepatic lobe. An MRI of the Liver was ordered and Gastroenterology Dr. Teena Irani was consulted for further evaluation and recommendations for Hepatic Failure and ?Hemetemasis. SLP evaluated the patient and recommended Dysphagia 3 Diet. Palliative Care Medicine was consulted for Goals of Care. Patient is severely jaundiced and has a T Bili of 30.2, AST of 218, ALT of 219, and Alk Phos of 701 with a MELD Score of 35. AKI is slightly improved. INR worsened from 2.20 -> 2.36. Gastroenterology recommending obtaining Daily INR and CMP's to follow. Will C/w Octreotide and IV PPI. Patient was also placed on a CIWA Protocol for concern of Withdrawls and a Urinalysis and UDS was ordered. Patient continues to have Acute Respiratory Failure with Hypoxia so will continue Supplemental O2 as patient likely has a PNA.  MRI revealed interval development of multiple hepatic lesions which are consistent with metastatic disease and development of biliary duct dilatation 2/2 to an area of soft tissue signal which could represent either a portal caval nodal metastasis or an exophytic pancreatic head adenocarcinoma. CT Chest/Abd/Pelvis w/wo contrast was obtained and showed extensive hepatic metastasis and biliary obstruction 2/2 to a pancreatic head mass,  likely the site of primary malignancy as well as suspected aspiration PNA. Palliative Care is involved and attempted to reach the sons for Goals of Care and it was found out that one of the sons is incarcerated. With the assistance of the Social Worker I was able to talk to the incarcerated son and updated him about his father's deteriorating condition. Patient's son relayed to me that his father wished to be a DNR in previous conversations and states that he wishes his father be a DNR.    Assessment & Plan:   Active Problems:   Hyponatremia   COPD (chronic obstructive pulmonary disease) (HCC)   Tobacco abuse   Chronic systolic congestive heart failure, NYHA class 3 (HCC)   HLD (hyperlipidemia)   CAD (nonobstructive per cath 2015)   HTN (hypertension)   AKI (acute kidney injury) (HCC)   Leukocytosis   Transaminitis   Dehydration   Protein-calorie malnutrition, severe   Acute respiratory failure with hypoxia (HCC)   ALF (acute liver failure)   Elevated troponin   Normocytic anemia   Sepsis ([place])   CAP (community acquired pneumonia)   Hematemesis   Palliative care by specialist   Pancreatic mass   Liver metastases ([place])   Pancreatic cancer metastasized to liver ([place])   Hypophosphatemia   Hypomagnesemia   Acute hepatic encephalopathy   Sinus tachycardia  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 -CT Chest/Abd/Pelvis w/wo contrast was obtained and showed extensive hepatic metastasis and biliary obstruction 2/2 to a pancreatic head mass, likely the site of primary malignancy as well as suspected aspiration PNA.  -Palliative Care involved  and appreciated their assistance -Gastroenterology Consulted and Appreciated Recc's -Gastroenterology Dr. Paulita Fujita evaluated today feels patient is in no state for procedures including Biopsy, ERCP+Stent and recommends Palliative Care -CA 19-9 was 1,335 -Education officer, museum called by Palliative and through the Education officer, museum I was able to Speak with the Son and patient is now DNR   Acute respiratory failure with Hypoxia ([place]) Likely from Aspiration [place]  -C/w Supplemental O2 via [place] and [place] as Tolerated -Continuous Pulse Oximetery and Maintain O2 Saturations > 92%  Sepsis 2/2 to Aspiration PNA -WBC went from 16.3 -> 14.2 -> 12.6 -> 10.5 -> 8.0 -Lactic Acid went from 3.75 -> 1.36 -> 0.90 -CT Chest showed Right lower lobe peribronchovascular interstitial opacity with fluid and secretions in the endobronchial tree the. Suspicious for aspiration. Small right pleural effusion is likely secondary. -C/w IV Azithromycin/IV Ceftriaxone and IV Vancomycin and De-escalate -C/w Dulera 2 puff IH BID and DuoNeb 3 mL q6hprn Wheezing and SOB  Sinus Tachycardia -Patient is more Agitated and BP and HR went up  -C/w Fentanyl as needed -Given IV Bolus -C/w Telemetry  Hematemesis  -No recurrent Episodes -In a setting of liver disease; C/w Octreotide gtt and Pantoprazole gtt -HB/Hct went from 9.6/27.9 -> 10.4/30.9 -> 10.8/32.4 -> 10.1/30.1 -Repeat CBC in AM -Gastroenterology following  AKI (acute kidney injury) ([place]) possibly secondary to dehydration versus Hepatorenal syndrome -Improved after IVF -Patient's BUN/Cr went from 48/1.41 -> 35/0.84 -> 31/0.76 -> 11/0.58 -Continue to gently hydrate and repeat CMP in AM  ALF (acute liver failure) and Hepatic Encephalopathy -Likely from Pancreatic Cancer with Mets to the Liver as above -MELD score 35 with  52.6% Estimated 59-Month Mortality -Patient's INR went from 2.20 -> 2.47 -> 2.49 -> 2.28 -> 1.95 -TBili went from 30.2 -> 28.1 -> 29.4 -> 29.3 (Direct was 19.6  and Indirect was 9.7) -AST went from 218 -> 213 -> 219 -> 187 -ALT went from 219 -> 209 -> 211 -> 209 -Alk Phos went from 701 -> 644 -> 673 -> 735 -Was placed on CIWA with Lorazepam for concern of EtOH Component -C/w MVI, Thiamine, and Folic Acid -C/w Lactulose 20 grams TID; Checked Ammonia Level and was 67 -Gastroenterology consulted and signed off and recommending Palliative Care  CAD (nonobstructive per cath 2015) -Currently asymptomatic  -CK was 1450 and Troponin I was 0.13; Likely Demand Ischemia -Will continue to follow trend -ASA Held due to possible Hematemesis  Chronic systolic congestive heart failure, NYHA class 3 (HCC)  -Last ECHOCARDIOGRAM in March 2018 showed EF of 85% grade 1 diastolic dysfunction  -Continue to Monitor Volume Status   COPD (chronic obstructive pulmonary disease) (HCC)  -Continue when necessary Albuterol Nebs and DuoNebs q6hprn -C/w with Dulera and Mucinex 1200 mg po BID  -Encouraged Tobacco Cessation and given Nicotine 14 mg TD patch  HTN (Hypertension) -Hold home medications  Hyponatremia  -In the setting of liver failure and dehydration. -Na+ went from 129 -> 128 -> 129 -We'll obtain urine electrolytes, rehydrate and follow    Hypokalemia -Patient's K+ was 2.6 -Replete with IV KPhos 30 mmol, Potassium Chloride IV 40 mEQ -Continue to Monitor and Replete as Necessary -Repeat Phos Level in AM  Hypomagnesemia -Patient's Mag Level was 1.4 this AM -Replete with IV Mag Sulfate 3 grams -Continue to Monitor and Replete as Necessary -Repeat Phos Level in AM  Hypophosphatemia -Patient's Phos this AM was 2.4 -Replete with IV 30 mmol of KPhos -Continue to Monitor and Replete as Necessary -Repeat Phos Level in AM  Tobacco Abuse -Smoking Cessation Counseling given -C/w Nicotine Patch 14 mg TD  Protein-calorie malnutrition, severe  -Nutritional Consultation -C/w Ensure Enlive Feeding Supplements TIDwm   Normocytic Anemia -Hb/Hct went from  10.2/30.3 -> 9.6/27.9 -> 10.4/30.9 -> 10.8/32.4 -Repeat CBC in AM and continue to Monitor for S/Sx of bleeding  HLD (hyperlipidemia)  -Given Liver failure will hold statin  DVT prophylaxis: SCD's Code Status: FULL CODE Family Communication: Attempted to reach sons Disposition Plan: SNF vs. Hospice  Consultants:   Gastroenterology  Palliative Care Medicine   Procedures: None  Antimicrobials:  Anti-infectives    Start     Dose/Rate Route Frequency Ordered Stop   04/05/17 0500  vancomycin (VANCOCIN) IVPB 1000 mg/200 mL premix     1,000 mg 200 mL/hr over 60 Minutes Intravenous Every 24 hours 04/04/17 1202     04/03/17 0315  vancomycin (VANCOCIN) IVPB 750 mg/150 ml premix  Status:  Discontinued     750 mg 150 mL/hr over 60 Minutes Intravenous Every 24 hours 04/03/17 0303 04/04/17 1202   04/03/17 0300  cefTRIAXone (ROCEPHIN) 1 g in dextrose 5 % 50 mL IVPB     1 g 100 mL/hr over 30 Minutes Intravenous Every 24 hours 04/03/17 0255 04/10/17 0259   04/03/17 0300  azithromycin (ZITHROMAX) 500 mg in dextrose 5 % 250 mL IVPB     500 mg 250 mL/hr over 60 Minutes Intravenous Every 24 hours 04/03/17 0255 04/10/17 0259   04/03/17 0100  ampicillin-sulbactam (UNASYN) 1.5 g in sodium chloride 0.9 % 50 mL IVPB  Status:  Discontinued     1.5 g 100 mL/hr over 30 Minutes Intravenous  Once 04/03/17 0039 04/03/17 0057     Subjective: Seen and examined and was confused and agitated and wanted his safety mitts off. Patient is incomprehensible at times and difficult to assess what he was saying but was unable to cooperate or provide a subjective Hx. Discussed with incarcerated son about patient's deteriorating condition and son made patient DNR.    Objective: Vitals:   04/05/17 1532 04/05/17 1600 04/05/17 1800 04/05/17 1917  BP: 113/80 (!) 134/108 (!) 130/91 (!) 130/95  Pulse: (!) 108 (!) 114 (!) 106   Resp: (!) 24 19 (!) 24   Temp: 98.3 F (36.8 C)   98.1 F (36.7 C)  TempSrc: Axillary    Axillary  SpO2: 96% 94% 97%   Weight:      Height:        Intake/Output Summary (Last 24 hours) at 04/05/17 1921 Last data filed at 04/05/17 1422  Gross per 24 hour  Intake             1203 ml  Output             3450 ml  Net            -2247 ml   Filed Weights   04/03/17 0257  Weight: 44.2 kg (97 lb 7.1 oz)   Examination: Physical Exam:  Constitutional: Confused thin and cachectic chronically ill appearing Caucasian male who is confused and more agitated this AM.  Eyes: Sclerae are icteric. Lids appear normal. ENMT: External ears and nose appear normal. Mucous membranes  are not as dry Neck: Supple with no JVD Respiratory: Diminished to auscultation especially on Right. Patient had some crackles and mild rhonhci. Slightly tachypenic and wearing Supplemental O2 via [place] Cardiovascular: Tachycardic Rate but regular rhythm. No appreciable edema Abdomen: Soft, NT, Slightly distended. Bowel sounds present GU: Deferred Musculoskeletal: No contractures. No cyanosis. Skin: Whole body jaundice. Patient wearing safety mitts. No rashes or lesions on a limited skin eval Neurologic: CN 2-12 grossly intact. No appreciable focal deficits Psychiatric: Impaired Judgment and insight. Patient confused. Not alert or oriented.  Data Reviewed: I have personally reviewed following labs and imaging studies  CBC:  Recent Labs Lab 04/02/17 2305  04/03/17 1006 04/03/17 1604 04/03/17 2147 04/04/17 0330 04/05/17 0447  WBC 20.7*  < > 16.8* 14.2* 12.6* 10.5 8.0  NEUTROABS 18.9*  --   --   --   --  8.9* 6.6  HGB 11.9*  < > 10.2* 9.6* 10.4* 10.8* 10.1*  HCT 34.5*  < > 30.3* 27.9* 30.9* 32.4* 30.1*  MCV 84.8  < > 85.4 85.1 85.8 86.9 86.5  PLT 298  < > 282 259 260 265 238  < > = values in this interval not displayed. Basic Metabolic Panel:  Recent Labs Lab 04/02/17 2305 04/03/17 0056 04/03/17 0539 04/03/17 2147 04/04/17 0330 04/05/17 0447  NA 129*  --  128* 129* 128* 129*  K 3.4*  --  3.7  3.0* 2.9* 2.6*  CL 92*  --  93* 98* 98* 95*  CO2 23  --  24 23 23 26   GLUCOSE 134*  --  217* 109* 134* 195*  BUN 43*  --  48* 35* 31* 11  CREATININE 1.53*  --  1.41* 0.84 0.76 0.58*  CALCIUM 7.8*  --  7.8* 7.8* 8.0* 7.6*  MG  --  1.8 1.7  --  1.7 1.4*  PHOS  --  6.6* 6.6*  --  3.2 2.4*   GFR: Estimated Creatinine Clearance: 56.8 mL/min (A) (by C-G formula based on SCr of 0.58 mg/dL (L)). Liver Function Tests:  Recent Labs Lab 04/02/17 2305 04/03/17 0539 04/03/17 2147 04/04/17 0330 04/05/17 0447  AST 251* 218* 213* 219* 187*  ALT 235* 219* 209* 211* 209*  ALKPHOS 809* 701* 644* 673* 735*  BILITOT 29.9* 30.2* 28.1* 29.4* 29.3*  PROT 5.7* 5.2* 5.2* 5.0* 4.9*  ALBUMIN 2.4* 2.5* 2.3* 2.3* 2.1*  2.1*   No results for input(s): LIPASE, AMYLASE in the last 168 hours.  Recent Labs Lab 04/02/17 2305 04/03/17 0539 04/05/17 0447  AMMONIA 51* 34 66*   Coagulation Profile:  Recent Labs Lab 04/03/17 0539 04/03/17 2147 04/04/17 0853 04/05/17 0042 04/05/17 0447  INR 2.36 2.47 2.49 2.28 1.95   Cardiac Enzymes:  Recent Labs Lab 04/03/17 0056 04/03/17 0500  CKTOTAL  --  1,450*  TROPONINI 0.13*  --    BNP (last 3 results) No results for input(s): PROBNP in the last 8760 hours. HbA1C: No results for input(s): HGBA1C in the last 72 hours. CBG:  Recent Labs Lab 04/04/17 2214  GLUCAP 172*   Lipid Profile: No results for input(s): CHOL, HDL, LDLCALC, TRIG, CHOLHDL, LDLDIRECT in the last 72 hours. Thyroid Function Tests:  Recent Labs  04/03/17 0539  TSH 0.056*   Anemia Panel:  Recent Labs  04/03/17 0539  VITAMINB12 1,843*  FOLATE 13.0  FERRITIN 792*  TIBC 214*  IRON 22*  RETICCTPCT 1.3   Sepsis Labs:  Recent Labs Lab 04/02/17 2214 04/03/17 0053 04/03/17 0539 04/04/17 0330 04/04/17 0737  PROCALCITON  --   --  4.81  --   --   LATICACIDVEN 3.75* 1.36  --  1.1 0.9    Recent Results (from the past 240 hour(s))  Culture, blood (Routine x 2)      Status: None (Preliminary result)   Collection Time: 04/02/17  9:30 PM  Result Value Ref Range Status   Specimen Description BLOOD RIGHT ANTECUBITAL  Final   Special Requests IN PEDIATRIC BOTTLE Blood Culture adequate volume  Final   Culture NO GROWTH 3 DAYS  Final   Report Status PENDING  Incomplete  Culture, blood (Routine x 2)     Status: None (Preliminary result)   Collection Time: 04/02/17  9:55 PM  Result Value Ref Range Status   Specimen Description BLOOD RIGHT HAND  Final   Special Requests IN PEDIATRIC BOTTLE Blood Culture adequate volume  Final   Culture NO GROWTH 3 DAYS  Final   Report Status PENDING  Incomplete  MRSA PCR Screening     Status: Abnormal   Collection Time: 04/03/17  3:30 AM  Result Value Ref Range Status   MRSA by PCR INVALID RESULTS, SPECIMEN SENT FOR CULTURE (A) NEGATIVE Final    Comment: M. BROWN [id number] 07.07.2018 N. MORRIS        The GeneXpert MRSA Assay (FDA approved for NASAL specimens only), is one component of a comprehensive MRSA colonization surveillance program. It is not intended to diagnose MRSA infection nor to guide or monitor treatment for MRSA infections.   MRSA culture     Status: None   Collection Time: 04/03/17  8:39 AM  Result Value Ref Range Status   Specimen Description NASOPHARYNGEAL  Final   Special Requests NONE  Final   Culture NO MRSA DETECTED  Final   Report Status 04/04/2017 FINAL  Final    Radiology Studies: Ct Abdomen Pelvis W Wo Contrast  Result Date: 04/04/2017 CLINICAL DATA:  Multiple liver lesions suspicious for metastasis. Biliary obstruction. EXAM: CT CHEST WITH CONTRAST CT ABDOMEN AND PELVIS WITH AND WITHOUT CONTRAST TECHNIQUE: Multidetector CT imaging of the chest was performed during intravenous contrast administration. Multidetector CT imaging of the abdomen and pelvis was performed following the standard protocol before and during bolus administration of intravenous contrast. CONTRAST:  156m ISOVUE-300  IOPAMIDOL (ISOVUE-300) INJECTION 61% COMPARISON:  MRI of 1 day prior. Chest CT 12/03/2016. abdominopelvic CT of 10/22/2016. FINDINGS: CT CHEST FINDINGS Cardiovascular: Aortic and branch vessel atherosclerosis. Normal heart size, without pericardial effusion. Multivessel coronary artery atherosclerosis. No central pulmonary embolism, on this non-dedicated study. Mediastinum/Nodes: No supraclavicular adenopathy. No mediastinal or hilar adenopathy. Lungs/Pleura: Small right pleural effusion. Fluid or debris in the endobronchial tree including the right lower lobe. Moderate centrilobular emphysema. Right lower lobe peribronchovascular interstitial thickening is new since the prior exam, including on image 34/series 10. Clear left lung. Musculoskeletal: No acute osseous abnormality. CT ABDOMEN AND PELVIS FINDINGS Hepatobiliary: Multiple peripherally enhancing liver lesions, as on MRI. Gallbladder not well evaluated but grossly normal. Moderate intra and extrahepatic biliary ductal dilatation to the level of distal common duct obstruction, including on image 166/series 11. Pancreas: Pancreatic atrophy and duct dilatation within the body and tail are moderate. Soft tissue fullness along the cephalad aspect the pancreatic head is favored to be arising from the pancreas. Example at 2.8 x 2.1 cm on image 166/series 11. No acute pancreatitis. Spleen: Normal in size, without focal abnormality. Adrenals/Urinary Tract: Left adrenal adenoma, as on prior. Normal right adrenal gland. Too small to characterize left  renal lesions. Normal right kidney, without hydronephrosis. Normal urinary bladder. Stomach/Bowel: Mild gastric distension, without cause identified. The colon is normal in caliber. Mid small bowel loops are dilated, including at 5.4 cm on image 224/series 11. Solid stool like material at point of apparent transition, including on image 128/series 11. No pneumatosis or free intraperitoneal air. Vascular/Lymphatic: Advanced  aortic and branch vessel atherosclerosis. Portal and splenic veins patent. No abdominopelvic adenopathy. Reproductive: Normal prostate. Other: Trace perihepatic ascites. Musculoskeletal: Degenerative disc disease at the lumbosacral junction. IMPRESSION: 1. Extensive hepatic metastasis, as on MRI. 2. Biliary obstruction secondary to a pancreatic head mass, likely the site of primary malignancy. 3. Right lower lobe peribronchovascular interstitial opacity with fluid and secretions in the endobronchial tree the. Suspicious for aspiration. Small right pleural effusion is likely secondary. 4. Coronary artery atherosclerosis. Aortic Atherosclerosis (ICD10-I70.0). 5. Findings again suspicious for partial small bowel obstruction. Question internal hernia, given appearance of transition and similarity to the 10/22/2016 exam. 6. Small volume abdominal ascites. 7. Left adrenal adenoma. 8.  Emphysema (ICD10-J43.9). Electronically Signed   By: Abigail Miyamoto M.D.   On: 04/04/2017 11:23   Ct Chest W Contrast  Result Date: 04/04/2017 CLINICAL DATA:  Multiple liver lesions suspicious for metastasis. Biliary obstruction. EXAM: CT CHEST WITH CONTRAST CT ABDOMEN AND PELVIS WITH AND WITHOUT CONTRAST TECHNIQUE: Multidetector CT imaging of the chest was performed during intravenous contrast administration. Multidetector CT imaging of the abdomen and pelvis was performed following the standard protocol before and during bolus administration of intravenous contrast. CONTRAST:  11m ISOVUE-300 IOPAMIDOL (ISOVUE-300) INJECTION 61% COMPARISON:  MRI of 1 day prior. Chest CT 12/03/2016. abdominopelvic CT of 10/22/2016. FINDINGS: CT CHEST FINDINGS Cardiovascular: Aortic and branch vessel atherosclerosis. Normal heart size, without pericardial effusion. Multivessel coronary artery atherosclerosis. No central pulmonary embolism, on this non-dedicated study. Mediastinum/Nodes: No supraclavicular adenopathy. No mediastinal or hilar adenopathy.  Lungs/Pleura: Small right pleural effusion. Fluid or debris in the endobronchial tree including the right lower lobe. Moderate centrilobular emphysema. Right lower lobe peribronchovascular interstitial thickening is new since the prior exam, including on image 34/series 10. Clear left lung. Musculoskeletal: No acute osseous abnormality. CT ABDOMEN AND PELVIS FINDINGS Hepatobiliary: Multiple peripherally enhancing liver lesions, as on MRI. Gallbladder not well evaluated but grossly normal. Moderate intra and extrahepatic biliary ductal dilatation to the level of distal common duct obstruction, including on image 166/series 11. Pancreas: Pancreatic atrophy and duct dilatation within the body and tail are moderate. Soft tissue fullness along the cephalad aspect the pancreatic head is favored to be arising from the pancreas. Example at 2.8 x 2.1 cm on image 166/series 11. No acute pancreatitis. Spleen: Normal in size, without focal abnormality. Adrenals/Urinary Tract: Left adrenal adenoma, as on prior. Normal right adrenal gland. Too small to characterize left renal lesions. Normal right kidney, without hydronephrosis. Normal urinary bladder. Stomach/Bowel: Mild gastric distension, without cause identified. The colon is normal in caliber. Mid small bowel loops are dilated, including at 5.4 cm on image 224/series 11. Solid stool like material at point of apparent transition, including on image 128/series 11. No pneumatosis or free intraperitoneal air. Vascular/Lymphatic: Advanced aortic and branch vessel atherosclerosis. Portal and splenic veins patent. No abdominopelvic adenopathy. Reproductive: Normal prostate. Other: Trace perihepatic ascites. Musculoskeletal: Degenerative disc disease at the lumbosacral junction. IMPRESSION: 1. Extensive hepatic metastasis, as on MRI. 2. Biliary obstruction secondary to a pancreatic head mass, likely the site of primary malignancy. 3. Right lower lobe peribronchovascular interstitial  opacity with fluid and secretions in the  endobronchial tree the. Suspicious for aspiration. Small right pleural effusion is likely secondary. 4. Coronary artery atherosclerosis. Aortic Atherosclerosis (ICD10-I70.0). 5. Findings again suspicious for partial small bowel obstruction. Question internal hernia, given appearance of transition and similarity to the 10/22/2016 exam. 6. Small volume abdominal ascites. 7. Left adrenal adenoma. 8.  Emphysema (ICD10-J43.9). Electronically Signed   By: Abigail Miyamoto M.D.   On: 04/04/2017 11:23   Scheduled Meds: . feeding supplement (ENSURE ENLIVE)  237 mL Oral TID BM  . folic acid  1 mg Oral Daily  . guaiFENesin  1,200 mg Oral BID  . lactulose  20 g Oral TID  . mometasone-formoterol  2 puff Inhalation BID  . multivitamin with minerals  1 tablet Oral Daily  . nicotine  14 mg Transdermal Daily  . [START ON 04/06/2017] pantoprazole  40 mg Intravenous Q12H  . sodium chloride flush  3 mL Intravenous Q12H  . thiamine  100 mg Oral Daily   Continuous Infusions: . azithromycin Stopped (04/05/17 0314)  . cefTRIAXone (ROCEPHIN)  IV Stopped (04/05/17 0348)  . octreotide  (SANDOSTATIN)    IV infusion 50 mcg/hr (04/05/17 1737)  . pantoprozole (PROTONIX) infusion 8 mg/hr (04/05/17 1855)  . vancomycin Stopped (04/05/17 0523)    LOS: 2 days   TOTAL CARE TIME: [place], [place] [facility] Pager [phone]  If 7PM-7AM, please contact night-coverage [email or web address] Password [email or web address] 04/05/2017, 7:21 PM

## 2017-04-05 NOTE — Progress Notes (Signed)
Message sent to Dr Alfredia Ferguson with patient increase heart rate since 1300 and hypertension after 4 runs of potassium and potassium phosphate 30 meq given with 2 G Mag now infusing.

## 2017-04-05 NOTE — Progress Notes (Signed)
Patient becoming more agitated with increase blood pressure..  Ativan 2mg  IV given after different diversion activities attempted.  Patient has a more congestive cough.  Dr Alfredia Ferguson notified.

## 2017-04-05 NOTE — Progress Notes (Addendum)
4:45pm CSW spoke with pt son to clarify family contacts- Zeyad states that there are tow other sons- Saralyn Pilar and Dellis Filbert- the contact "Ava" is actually a dtr in law (married to [place])- states boths of these sons are estranged and that he was the only one in regular contact with the patient  Son states that he has multiple friends, Sabino Snipes, [place], and Teena Irani who he gives permission to have medical information  Pt son very upset by this news but states he felt as if patient was declining and was trying to prepare himself for this- asked that CSW tell patient "Me and you against the world" which is something his father told him since childhood.  4:30pm Received call from prison and was able connect MD and Mr. Cork son to discuss goals of care.  4:13pm CSW received call from palliative team to assist with contacting patient son, Isai, who is currently incarcerated at [facility] in [place], [place].  [place] called the correctional center and was put in touch with Joycie Peek [phone]# [phone]- programs department)- CSW explained the situation and sent proof of my employment with [employer]. Mr. Ernestine Mcmurray will reach out to [place] will things have been arranged.  CSW updated MD and will connect MD with prisoner to discuss pt current medical care/ plan.  CSW will continue to follow and assist as needed.  Jorge Ny, LCSW Clinical Social Worker [phone]

## 2017-04-05 NOTE — Patient Outreach (Signed)
[facility]) Care Management  04/05/2017  NATANIEL GASPER [birth year] [id number]   CSW noted that patient presented to the Emergency Department at [facility] on Friday, July 6th, after being found unresponsive on the floor of his motel room.  The landlord noted that she had not seen the patient for several days, contacting the [facility] Department to perform a courtesy visit.  Patient was admitted for Liver Failure with Hepatic Encephalopathy.  In addition, patient has possible Hematemesis and Severe Dehydration and Sepsis from CAP/Aspiration. Patient was placed on antibiotics and underwent an abdominal ultrasound, which showed a Heterogeneous Liver with multiple lesions. Goals of care meeting needs to be scheduled with patient's son and daughter.  CSW will continue to follow to assess and assist with discharge planning needs and services. Nat Christen, BSW, MSW, LCSW  Licensed Education officer, [facility]  Mailing [place] [address] [place], [place] [address] Physical Address-[address], [place], [place] [address] Toll Free Main # [phone] Fax # [phone] Cell # [phone]  Office # [phone] [email or web address]

## 2017-04-05 NOTE — Progress Notes (Signed)
Subjective: Confused and combative; unable to provide history.  Objective: Vital signs in last 24 hours: Temp:  [98 F (36.7 C)-99.1 F (37.3 C)] 99.1 F (37.3 C) (07/09 1206) Pulse Rate:  [84-105] 100 (07/09 1206) Resp:  [14-22] 19 (07/09 1206) BP: (108-136)/(81-103) 130/103 (07/09 1206) SpO2:  [87 %-98 %] 94 % (07/09 1206) Weight change:  Last BM Date: 04/04/17  PE: GEN:  Jaundiced, confused, combative, cachectic ABD:  Distended, tympanic, no appreciable tenderness  Lab Results: CBC    Component Value Date/Time   WBC 8.0 04/05/2017 0447   RBC 3.48 (L) 04/05/2017 0447   HGB 10.1 (L) 04/05/2017 0447   HCT 30.1 (L) 04/05/2017 0447   PLT 238 04/05/2017 0447   MCV 86.5 04/05/2017 0447   MCH 29.0 04/05/2017 0447   MCHC 33.6 04/05/2017 0447   RDW 16.2 (H) 04/05/2017 0447   LYMPHSABS 0.8 04/05/2017 0447   MONOABS 0.4 04/05/2017 0447   EOSABS 0.3 04/05/2017 0447   BASOSABS 0.0 04/05/2017 0447   CMP     Component Value Date/Time   NA 129 (L) 04/05/2017 0447   K 2.6 (LL) 04/05/2017 0447   CL 95 (L) 04/05/2017 0447   CO2 26 04/05/2017 0447   GLUCOSE 195 (H) 04/05/2017 0447   BUN 11 04/05/2017 0447   CREATININE 0.58 (L) 04/05/2017 0447   CALCIUM 7.6 (L) 04/05/2017 0447   PROT 4.9 (L) 04/05/2017 0447   ALBUMIN 2.1 (L) 04/05/2017 0447   ALBUMIN 2.1 (L) 04/05/2017 0447   AST 187 (H) 04/05/2017 0447   ALT 209 (H) 04/05/2017 0447   ALKPHOS 735 (H) 04/05/2017 0447   BILITOT 29.3 (HH) 04/05/2017 0447   GFRNONAA >60 04/05/2017 0447   GFRAA >60 04/05/2017 0447    Assessment:  1.  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 2.  Failure to thrive. 3.  Confusion. 4.  Elevated LFTs, mixed hepatocellular and obstructive pattern.  Likely from liver mets + bile duct obstruction.  Plan:  1.  Patient is in no state for any procedures (biopsy, ercp + stent). 2.  I think patient needs palliative care. 3.  No further GI intervention planned. 4.  Will sign-off; please call  with questions.   Landry Dyke 04/05/2017, 1:38 PM   Pager [phone] If no answer or after 5 PM call [phone]

## 2017-04-05 NOTE — Care Management Note (Addendum)
Case Management Note  Patient Details  Name: Anthony Bolton MRN: [id number] Date of Birth: 1951-06-13  Subjective/Objective:  Found on floor of hotel room by [place] and brought to hospital. 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 MD has not been able to reach any family members, CSW was able to contact a son in prison and was able to connect MD with him to discuss goals of care.  PT eval rec SNF.      7/10 Anthony Bamberger RN, BSN - son is here from [employer] with patient, per palliative he will be going to residential hospice, she will contact CSW.              Action/Plan: NCM will follow along with CSW for dc needs.   Expected Discharge Date:  04/06/17               Expected Discharge Plan:     In-House Referral:     Discharge planning Services  CM Consult  Post Acute Care Choice:    Choice offered to:     DME Arranged:    DME Agency:     HH Arranged:    HH Agency:     Status of Service:  In process, will continue to follow  If discussed at Long Length of Stay Meetings, dates discussed:    Additional Comments:  Anthony Mayo, RN 04/05/2017, 7:06 PM

## 2017-04-05 NOTE — Progress Notes (Signed)
CRITICAL VALUE ALERT  Critical Value:  K+ 2.6  Date & Time Notied:  04/05/2017  Provider Notified: O[facility]  Orders Received/Actions taken: Repletion orders given

## 2017-04-05 NOTE — Progress Notes (Addendum)
Palliative Care  [name] is unable to participate in a goals of care conversation. He has two family members listed in the chart--a son and daughter. I have called and left messages for both of them with my contact information. He also has a friend, Anthony Bolton, listed as an emergency contact. I spoke with her over the phone. She has known Anthony Bolton for 30 years and they are close friends. She does not have any contact information for family. Given necessity for confidentiality, I was not able to provide her with a medical update. I did indicate the need for identifying a health care decision maker. She feels like he may have "legal papers for that" at his house. She plans to go to his house and look for the papers and will call me back with an update. I also asked if she had any indication of his wishes regarding aggressiveness of care and end of life issues. She did not. I will plan to reach out to her again tomorrow.  Plan -Messages left with all known family members -Friend to look for HCPOA/Living will documents at patient's home -Discussed situation with SW and need for clarified decision maker ASAP  Addendum: Spoke with Anthony Bolton again and she mentioned that the patient's son was possibly incarcerated. I confirmed this and identified his location. SW updated and determining allowance and capacity to contact him for medical decision making for the patient (or if he has other contact information for his sister). SW will work with facility to connect provider with son. Given the timing, I suspect the primary team will have to have this conversation with him if it occurs today.   Addendum 2: Anthony Bolton called and said she was able to get the contact information for another son. She admits that she isn't sure how many children he has, but believe it may just be two sons and a daughter. The other son, Anthony Bolton, was given my contact information and the importance of speaking with me was relayed. She did not  have his contact information easily accessible as she is at work.   Anthony Bolton AGNP-C Palliative Care [phone]  No charge note.

## 2017-04-06 ENCOUNTER — Other Ambulatory Visit: Payer: Self-pay

## 2017-04-06 ENCOUNTER — Other Ambulatory Visit: Payer: Self-pay | Admitting: *Deleted

## 2017-04-06 DIAGNOSIS — C799 Secondary malignant neoplasm of unspecified site: Secondary | ICD-10-CM

## 2017-04-06 DIAGNOSIS — Z7189 Other specified counseling: Secondary | ICD-10-CM

## 2017-04-06 LAB — CBC WITH DIFFERENTIAL/PLATELET
BASOS ABS: 0 10*3/uL (ref 0.0–0.1)
BASOS PCT: 0 %
EOS ABS: 0.2 10*3/uL (ref 0.0–0.7)
EOS PCT: 3 %
HCT: 31.1 % — ABNORMAL LOW (ref 39.0–52.0)
Hemoglobin: 10.4 g/dL — ABNORMAL LOW (ref 13.0–17.0)
Lymphocytes Relative: 7 %
Lymphs Abs: 0.5 10*3/uL — ABNORMAL LOW (ref 0.7–4.0)
MCH: 28.5 pg (ref 26.0–34.0)
MCHC: 33.4 g/dL (ref 30.0–36.0)
MCV: 85.2 fL (ref 78.0–100.0)
MONO ABS: 0.3 10*3/uL (ref 0.1–1.0)
Monocytes Relative: 4 %
Neutro Abs: 5.8 10*3/uL (ref 1.7–7.7)
Neutrophils Relative %: 86 %
PLATELETS: 225 10*3/uL (ref 150–400)
RBC: 3.65 MIL/uL — AB (ref 4.22–5.81)
RDW: 16.5 % — AB (ref 11.5–15.5)
WBC: 6.8 10*3/uL (ref 4.0–10.5)

## 2017-04-06 LAB — PROTIME-INR
INR: 1.5
PROTHROMBIN TIME: 18.3 s — AB (ref 11.4–15.2)

## 2017-04-06 LAB — HEPATIC FUNCTION PANEL
ALK PHOS: 828 U/L — AB (ref 38–126)
ALT: 224 U/L — ABNORMAL HIGH (ref 17–63)
AST: 186 U/L — ABNORMAL HIGH (ref 15–41)
Albumin: 2.2 g/dL — ABNORMAL LOW (ref 3.5–5.0)
BILIRUBIN DIRECT: 23.5 mg/dL — AB (ref 0.1–0.5)
BILIRUBIN INDIRECT: 8.9 mg/dL — AB (ref 0.3–0.9)
BILIRUBIN TOTAL: 32.4 mg/dL — AB (ref 0.3–1.2)
TOTAL PROTEIN: 5.4 g/dL — AB (ref 6.5–8.1)

## 2017-04-06 LAB — CK TOTAL AND CKMB (NOT AT ARMC)
CK, MB: 9.1 ng/mL — AB (ref 0.5–5.0)
RELATIVE INDEX: 1.9 (ref 0.0–2.5)
Total CK: 473 U/L — ABNORMAL HIGH (ref 49–397)

## 2017-04-06 LAB — RENAL FUNCTION PANEL
Albumin: 2.1 g/dL — ABNORMAL LOW (ref 3.5–5.0)
Anion gap: 10 (ref 5–15)
BUN: 9 mg/dL (ref 6–20)
CHLORIDE: 91 mmol/L — AB (ref 101–111)
CO2: 27 mmol/L (ref 22–32)
CREATININE: 0.47 mg/dL — AB (ref 0.61–1.24)
Calcium: 7.9 mg/dL — ABNORMAL LOW (ref 8.9–10.3)
GFR calc Af Amer: >60 mL/min (ref >60)
GLUCOSE: 115 mg/dL — AB (ref 65–99)
Phosphorus: 2.8 mg/dL (ref 2.5–4.6)
Potassium: 2.9 mmol/L — ABNORMAL LOW (ref 3.5–5.1)
Sodium: 128 mmol/L — ABNORMAL LOW (ref 135–145)

## 2017-04-06 LAB — AMMONIA: AMMONIA: 70 umol/L — AB (ref 9–35)

## 2017-04-06 LAB — MAGNESIUM: MAGNESIUM: 1.7 mg/dL (ref 1.7–2.4)

## 2017-04-06 MED ORDER — MORPHINE SULFATE 10 MG/5ML PO SOLN: 5.0000 mg | ORAL | Status: DC | PRN

## 2017-04-06 MED ORDER — POTASSIUM CHLORIDE 10 MEQ/100ML IV SOLN
10.0000 meq | INTRAVENOUS | Status: DC
  Administered 2017-04-06 (×5): 10 meq via INTRAVENOUS
  Filled 2017-04-06 (×3): qty 100

## 2017-04-06 MED ORDER — MORPHINE SULFATE (CONCENTRATE) 10 MG/0.5ML PO SOLN
5.0000 mg | ORAL | Status: DC | PRN
  Administered 2017-04-06: 5 mg via ORAL
  Filled 2017-04-06: qty 0.5

## 2017-04-06 NOTE — Patient Outreach (Addendum)
[facility]) Care Management  04/06/17  Anthony Bolton July 22, 1951 [id number]  Patient currently inpatient with poor prognosis. Being followed by palliative care for end of life care. Patient's son planning to visit with patient. Will continue to follow to monitor for discharge plans.  Addendum 04/27/2017: RNCM closed case as of 04/06/2017 as patient under care of hospice.  [place] R. Lathen Seal, RN, BSN, [place] Management Coordinator [phone]

## 2017-04-06 NOTE — Progress Notes (Signed)
PROGRESS NOTE    Anthony Bolton  YSH:[id number] DOB: 1951/04/07 DOA: 04/02/2017 PCP: Lucianne Lei, MD  Brief Narrative: The patient is a 15 to Male with a PMH of Aceteminophen Toxicity in March 2018, COPD, Tobacco Abuse, CAD, HTN, Anemia and other comorbids who was found on the floor of his Motel Room by the landlord and brought into the ED for evaluation. He was admitted for Liver Failure with Hepatic Encephalopathy along with possible Hematemesis and Severe Dehydration and Sepsis from CAP/Aspiration. He was placed on Abx and had an U/S of the Abdomen which showed a Heterogeneous liver with multiple lesions present, largest of which measures 2.7 x 2.8 x 3.4 cm in the right hepatic lobe. An MRI of the Liver was ordered and Gastroenterology Dr. Teena Irani was consulted for further evaluation and recommendations for Hepatic Failure and ?Hemetemasis. SLP evaluated the patient and recommended Dysphagia 3 Diet. Palliative Care Medicine was consulted for Goals of Care. Patient is severely jaundiced and has a T Bili of 30.2, AST of 218, ALT of 219, and Alk Phos of 701 with a MELD Score of 35. AKI is slightly improved. INR worsened from 2.20 -> 2.36. Gastroenterology recommending obtaining Daily INR and CMP's to follow. Will C/w Octreotide and IV PPI. Patient was also placed on a CIWA Protocol for concern of Withdrawls and a Urinalysis and UDS was ordered. Patient continues to have Acute Respiratory Failure with Hypoxia so will continue Supplemental O2 as patient likely has a PNA.  MRI revealed interval development of multiple hepatic lesions which are consistent with metastatic disease and development of biliary duct dilatation 2/2 to an area of soft tissue signal which could represent either a portal caval nodal metastasis or an exophytic pancreatic head adenocarcinoma. CT Chest/Abd/Pelvis w/wo contrast was obtained and showed extensive hepatic metastasis and biliary obstruction 2/2 to a pancreatic head mass,  likely the site of primary malignancy as well as suspected aspiration PNA. Palliative Care is involved and attempted to reach the sons for Goals of Care and it was found out that one of the sons is incarcerated. With the assistance of the Social Worker I was able to talk to the incarcerated son and updated him about his father's deteriorating condition. Patient's son relayed to me that his father wished to be a DNR in previous conversations and states that he wishes his father be a DNR.   Today (04/06/17) the patient's son came from prison to visit his father and met with the Palliative Care team. Patient's son understood the severity of the situation and elected to transition the patient to Full Comfort Measures and transition the patient to [facility]. Bed will be available tomorrow for Hospice Transfer.   Assessment & Plan:   Active Problems:   Hyponatremia   COPD (chronic obstructive pulmonary disease) (HCC)   Tobacco abuse   Chronic systolic congestive heart failure, NYHA class 3 (HCC)   HLD (hyperlipidemia)   CAD (nonobstructive per cath 2015)   HTN (hypertension)   AKI (acute kidney injury) ([place])   Leukocytosis   Transaminitis   Dehydration   Protein-calorie malnutrition, severe   Acute respiratory failure with hypoxia (HCC)   ALF (acute liver failure)   Elevated troponin   Normocytic anemia   Sepsis ([place])   CAP (community acquired pneumonia)   Hematemesis   Palliative care by specialist   Pancreatic mass   Liver metastases [facility])   Pancreatic cancer metastasized to liver ([place])   Hypophosphatemia  Hypomagnesemia   Acute hepatic encephalopathy   Sinus tachycardia   Multiple lesions of metastatic malignancy (HCC)   Goals of care, counseling/discussion  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 -CT Chest/Abd/Pelvis w/wo contrast was obtained and showed extensive hepatic metastasis and biliary obstruction 2/2 to a pancreatic head mass, likely the site of primary malignancy as well as suspected aspiration PNA.  -Palliative Care involved and appreciated their assistance -Gastroenterology Consulted and Appreciated Recc's -Gastroenterology Dr. Dulce Sellar evaluated today feels patient is in no state for procedures including Biopsy, ERCP+Stent and recommends Palliative Care -CA 19-9 was 1,335 -Child psychotherapist called by Palliative and through the Child psychotherapist I was able to Speak with the Son yesterday and patient is now DNR -Palliative Care team met with the son and he elected that his father go on Full Comfort Measures and plan is to transfer the patient to Residential Hospice at [facility] in AM as they will have a bed available tomorrow.   Acute respiratory failure with Hypoxia (HCC) Likely from Aspiration [place]  -C/w Supplemental O2 via [place] for Comfort -Continuous Pulse Oximetry   Sepsis 2/2 to Aspiration PNA -WBC went from 16.3 -> 14.2 -> 12.6 -> 10.5 -> 8.0 -> 6.8 -Lactic Acid went from 3.75 -> 1.36 -> 0.90 -CT Chest showed Right lower lobe peribronchovascular interstitial opacity with fluid and secretions in the endobronchial tree the. Suspicious for aspiration. Small right pleural effusion is likely secondary. -C/w IV Azithromycin/IV Ceftriaxone until transferred to Hospice. IV Vancomycin stopped by Palliative Care Team -C/w Dulera 2 puff IH BID and DuoNeb 3 mL q6hprn Wheezing and SOB for Comfort  Sinus Tachycardia -Patient is now Comfort Measures and has Morphine 5 mg po q2hprn, Lorazepam 2-3 mg IV q1hr prn and other medications for comfort -Appreciated Palliative Care  Involvement  Hematemesis  -No recurrent Episodes -In a setting of liver disease; Octreotide gtt Stopped now that Patient is Comfort  -C/w Pantoprazole gtt for now -HB/Hct went from 9.6/27.9 -> 10.4/30.9 -> 10.8/32.4 -> 10.1/30.1 -> 10.4/31.1 -Will not repeat CBC in AM as patient is now Comfort -Gastroenterology no longer following  AKI (acute kidney injury) (HCC) possibly secondary to dehydration versus Hepatorenal syndrome -Improved after IVF -Patient's BUN/Cr went from 48/1.41 -> 35/0.84 -> 31/0.76 -> 11/0.58 -> 9/0.47  -Will not repeat CMP in AM as patient is now Comfort   ALF (acute liver failure) and Hepatic Encephalopathy -Likely from Pancreatic Cancer with Mets to the Liver as above -MELD score 35 with  52.6% Estimated 73-Month Mortality -Patient's INR went from 2.20 -> 2.47 -> 2.49 -> 2.28 -> 1.95 -> 1.50 -TBili went from 30.2 -> 28.1 -> 29.4 -> 29.3 -> 32.4 (Direct was 23.5 and Indirect was 8.9) -AST went from 218 -> 213 -> 219 -> 187 -> 186 -ALT went from 219 -> 209 -> 211 -> 209 -> 224 -Alk Phos went from 701 -> 644 -> 673 -> 735 -> 828 -Was placed on CIWA with Lorazepam for concern of EtOH Component -C/w MVI, Thiamine, and Folic Acid -C/w Lactulose 20 grams TID; Checked Ammonia Level and was 62 -Gastroenterology consulted and signed off and recommending Palliative Care -Patient is Now Comfort Care  CAD (nonobstructive per cath 2015) -Currently asymptomatic  -CK was 1450 and Troponin I was 0.13; Likely Demand Ischemia -Will not continue to follow trend as patient is Comfort -ASA Held due to possible Hematemesis  Chronic systolic congestive heart failure, NYHA class 3 (HCC)  -Last ECHOCARDIOGRAM in March 2018 showed EF of 89% grade 1 diastolic dysfunction  -Continue to Monitor Volume Status  -Patient is now Comfort Measures  COPD (chronic obstructive pulmonary disease) ([place])  -Continue when necessary Albuterol Nebs and DuoNebs q6hprn -C/w with Dulera and Mucinex  1200 mg po BID  -Encouraged Tobacco Cessation and given Nicotine 14 mg TD patch  HTN (Hypertension) -Hold home medications; Patient is now Comfort Measures  Hyponatremia  -In the setting of liver failure and dehydration. -Na+ went from 129 -> 128 -> 129 -> 128 -Will not follow trend now that patient is Comfort measures  Hypokalemia -Patient's K+ was 2.9 -Replete with Potassium Chloride IV 60 mEQ -Will not Monitor and repeat Labs now that patient is Comfort Measures  Hypomagnesemia, improved -Patient's Mag Level was 1.7this AM -Will not Continue to Monitor as Patient is now Comfort Measures  Hypophosphatemia, improved -Patient's Phos this AM was 2.8 -Will not Continue to Monitor as Patient is now Comfort Measures  Tobacco Abuse -Smoking Cessation Counseling given -C/w Nicotine Patch 14 mg TD  Protein-calorie malnutrition, severe  -Nutritional Consultation -C/w Ensure Enlive Feeding Supplements TIDwm   Normocytic Anemia -Hb/Hct went from 10.2/30.3 -> 9.6/27.9 -> 10.4/30.9 -> 10.8/32.4 -> 10.4/31.1 -Will not Continue to Monitor as Patient is now Comfort Measures  HLD (hyperlipidemia)  -Given Liver failure will hold statin  DVT prophylaxis: SCD's Code Status: DO NOT RESUSCITATE; PATIENT IS NOW COMFORT MEASURES Family Communication: No family present at bedside Disposition Plan: Residential Hospice in AM  Consultants:   Gastroenterology  Palliative Care Medicine   Procedures: None  Antimicrobials:  Anti-infectives    Start     Dose/Rate Route Frequency Ordered Stop   04/05/17 0500  vancomycin (VANCOCIN) IVPB 1000 mg/200 mL premix  Status:  Discontinued     1,000 mg 200 mL/hr over 60 Minutes Intravenous Every 24 hours 04/04/17 1202 04/06/17 1401   04/03/17 0315  vancomycin (VANCOCIN) IVPB 750 mg/150 ml premix  Status:  Discontinued     750 mg 150 mL/hr over 60 Minutes Intravenous Every 24 hours 04/03/17 0303 04/04/17 1202   04/03/17 0300  cefTRIAXone (ROCEPHIN)  1 g in dextrose 5 % 50 mL IVPB     1 g 100 mL/hr over 30 Minutes Intravenous Every 24 hours 04/03/17 0255 04/10/17 0259   04/03/17 0300  azithromycin (ZITHROMAX) 500 mg in dextrose 5 % 250 mL IVPB     500 mg 250 mL/hr over 60 Minutes Intravenous Every 24 hours 04/03/17 0255 04/10/17 0259   04/03/17 0100  ampicillin-sulbactam (UNASYN) 1.5 g in sodium chloride 0.9 % 50 mL IVPB  Status:  Discontinued     1.5 g 100 mL/hr over 30 Minutes Intravenous  Once 04/03/17 0039 04/03/17 0057     Subjective: Seen and examined and was still confused and some what incomprehensible. Palliative Care met with the son who came from prison to visit his father and elected to make the patient Comfort Measures as he was unable to make decisions for himself. All aggressive measures stopped and patient to be transferred to [facility] in AM.     Objective: Vitals:   04/06/17 0800 04/06/17 1000 04/06/17 1135 04/06/17 1200  BP: 98/82 (!) 86/77 106/87 101/85  Pulse: 96 97 (!) 102 97  Resp:  (!) 22 18   Temp:   98.1 F (36.7 C)   TempSrc:   Axillary   SpO2:  97% 98%   Weight:      Height:        Intake/Output Summary (Last 24 hours) at 04/06/17 1640 Last data filed at 04/06/17 1138  Gross per 24 hour  Intake          1814.25 ml  Output             2400 ml  Net          -585.75 ml   Filed Weights   04/03/17 0257  Weight: 44.2 kg (97 lb 7.1 oz)   Examination: Physical Exam:  Constitutional: Patient is a confused and Cachectic, chronically ill appearing Caucasian male in NAD Eyes: Sclerae severely icteric. Lids normal ENMT: External Ears and Nose appear normal. Mucous membranes are moist Neck: Supple with no visible JVD Respiratory: Diminished particularly on the Right compared to left. Patient had some mild wheezing/rhonchi. No appreciable crackles. Patient was not tachypenic or using any accessory muscles to breathe.  Cardiovascular: RRR; S1 S2; No lower extremity edema Abdomen: Soft, NT, ND. Bowel  sounds present. Severe yellowing of abdomen GU: Deferred; Patient had condom cath in place.  Musculoskeletal: No contractures or cyanosis; Big toe nails are overgrown.  Skin: Whole body jaundice; Warm and dry. No rashes or lesions on a limited skin eval Neurologic: No appreciable focal deficits. CN 2-12 were grossly intact but patient mumbles. Psychiatric: Impaired judgement and insight; Patient is confused. Not as agitated today and appears more calm.   Data Reviewed: I have personally reviewed following labs and imaging studies  CBC:  Recent Labs Lab 04/02/17 2305  04/03/17 1604 04/03/17 2147 04/04/17 0330 04/05/17 0447 04/06/17 0814  WBC 20.7*  < > 14.2* 12.6* 10.5 8.0 6.8  NEUTROABS 18.9*  --   --   --  8.9* 6.6 5.8  HGB 11.9*  < > 9.6* 10.4* 10.8* 10.1* 10.4*  HCT 34.5*  < > 27.9* 30.9* 32.4* 30.1* 31.1*  MCV 84.8  < > 85.1 85.8 86.9 86.5 85.2  PLT 298  < > 259 260 265 238 225  < > = values in this interval not displayed. Basic Metabolic Panel:  Recent Labs Lab 04/03/17 0056 04/03/17 0539 04/03/17 2147 04/04/17 0330 04/05/17 0447 04/06/17 0227  NA  --  128* 129* 128* 129* 128*  K  --  3.7 3.0* 2.9* 2.6* 2.9*  CL  --  93* 98* 98* 95* 91*  CO2  --  _0 GLUCOSE  --  217* 109* 134* 195* 115*  BUN  --  48* 35* 31* 11 9  CREATININE  --  1.41* 0.84 0.76 0.58* 0.47*  CALCIUM  --  7.8* 7.8* 8.0* 7.6* 7.9*  MG 1.8 1.7  --  1.7 1.4* 1.7  PHOS 6.6* 6.6*  --  3.2 2.4* 2.8   GFR: Estimated Creatinine Clearance: 56.8 mL/min (A) (by C-G formula based on SCr of 0.47 mg/dL (L)). Liver Function Tests:  Recent Labs Lab 04/03/17 0539 04/03/17 2147 04/04/17 0330 04/05/17 0447 04/06/17 0227  AST 218* 213* 219* 187* 186*  ALT 219* 209* 211* 209* 224*  ALKPHOS 701* 644* 673* 735* 828*  BILITOT 30.2* 28.1* 29.4* 29.3* 32.4*  PROT 5.2* 5.2* 5.0* 4.9* 5.4*  ALBUMIN 2.5* 2.3* 2.3* 2.1*  2.1* 2.2*  2.1*   No results  for input(s): LIPASE, AMYLASE in the last 168  hours.  Recent Labs Lab 04/02/17 2305 04/03/17 0539 04/05/17 0447 04/06/17 0227  AMMONIA 51* 34 66* 70*   Coagulation Profile:  Recent Labs Lab 04/03/17 2147 04/04/17 0853 04/05/17 0042 04/05/17 0447 04/06/17 0814  INR 2.47 2.49 2.28 1.95 1.50   Cardiac Enzymes:  Recent Labs Lab 04/03/17 0056 04/03/17 0500 04/06/17 0227  CKTOTAL  --  1,450* 473*  CKMB  --   --  9.1*  TROPONINI 0.13*  --   --    BNP (last 3 results) No results for input(s): PROBNP in the last 8760 hours. HbA1C: No results for input(s): HGBA1C in the last 72 hours. CBG:  Recent Labs Lab 04/04/17 2214  GLUCAP 172*   Lipid Profile: No results for input(s): CHOL, HDL, LDLCALC, TRIG, CHOLHDL, LDLDIRECT in the last 72 hours. Thyroid Function Tests: No results for input(s): TSH, T4TOTAL, FREET4, T3FREE, THYROIDAB in the last 72 hours. Anemia Panel: No results for input(s): VITAMINB12, FOLATE, FERRITIN, TIBC, IRON, RETICCTPCT in the last 72 hours. Sepsis Labs:  Recent Labs Lab 04/02/17 2214 04/03/17 0053 04/03/17 0539 04/04/17 0330 04/04/17 0603  PROCALCITON  --   --  4.81  --   --   LATICACIDVEN 3.75* 1.36  --  1.1 0.9    Recent Results (from the past 240 hour(s))  Culture, blood (Routine x 2)     Status: None (Preliminary result)   Collection Time: 04/02/17  9:30 PM  Result Value Ref Range Status   Specimen Description BLOOD RIGHT ANTECUBITAL  Final   Special Requests IN PEDIATRIC BOTTLE Blood Culture adequate volume  Final   Culture NO GROWTH 4 DAYS  Final   Report Status PENDING  Incomplete  Culture, blood (Routine x 2)     Status: None (Preliminary result)   Collection Time: 04/02/17  9:55 PM  Result Value Ref Range Status   Specimen Description BLOOD RIGHT HAND  Final   Special Requests IN PEDIATRIC BOTTLE Blood Culture adequate volume  Final   Culture NO GROWTH 4 DAYS  Final   Report Status PENDING  Incomplete  MRSA PCR Screening     Status: Abnormal   Collection Time:  04/03/17  3:30 AM  Result Value Ref Range Status   MRSA by PCR INVALID RESULTS, SPECIMEN SENT FOR CULTURE (A) NEGATIVE Final    Comment: M. BROWN [id number] 07.07.2018 N. MORRIS        The GeneXpert MRSA Assay (FDA approved for NASAL specimens only), is one component of a comprehensive MRSA colonization surveillance program. It is not intended to diagnose MRSA infection nor to guide or monitor treatment for MRSA infections.   MRSA culture     Status: None   Collection Time: 04/03/17  8:39 AM  Result Value Ref Range Status   Specimen Description NASOPHARYNGEAL  Final   Special Requests NONE  Final   Culture NO MRSA DETECTED  Final   Report Status 04/04/2017 FINAL  Final    Radiology Studies: No results found. Scheduled Meds: . feeding supplement (ENSURE ENLIVE)  237 mL Oral TID BM  . guaiFENesin  1,200 mg Oral BID  . mometasone-formoterol  2 puff Inhalation BID  . nicotine  14 mg Transdermal Daily  . pantoprazole  40 mg Intravenous Q12H  . sodium chloride flush  3 mL Intravenous Q12H   Continuous Infusions: . azithromycin Stopped (04/06/17 0411)  . cefTRIAXone (ROCEPHIN)  IV Stopped (04/06/17 0256)    LOS: 3 days  Kerney Elbe, DO [facility] Pager [phone]  If 7PM-7AM, please contact night-coverage [email or web address] Password [email or web address] 04/06/2017, 4:40 PM

## 2017-04-06 NOTE — Consult Note (Signed)
   [facility] Inpatient Consult   04/06/2017  Anthony Bolton 01/18/1951 [id number]    Anthony Bolton has been active with [place] Management program. Please see chart review tab then encounters for patient outreach details.  Chart reviewed. Noted Palliative Team involved. Noted poor prognosis.   Will update [facility] team.   Marthenia Rolling, [place], RN,BSN [facility] Liaison [phone]

## 2017-04-06 NOTE — Progress Notes (Signed)
  Son visited with patient for about 30 mins .  Son asked very appropriate questions and given more information about [employer].  Patient able to respond to son at times.

## 2017-04-06 NOTE — Consult Note (Signed)
Consultation Note Date: 04/06/2017   Patient Name: Anthony Bolton  DOB: May 17, 1951  MRN: [id number]  Age / Sex: 66 y.o., male  PCP: Lucianne Lei, MD Referring Physician: Kerney Elbe, DO  Reason for Consultation: Disposition, Establishing goals of care and Psychosocial/spiritual support  HPI/Patient Profile: 66 y.o. 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 In his last hospitalization he was discharged to SNF, however then transitioned to living in a motel. This admission was prompted when the motel's manager called EMS after he hadn't seen the patient for a few days. EMS found him lying on the floor, incontinent of urine and stool, hypoxic, confused, and jaundiced. He was admitted on 04/02/2017 . 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 CA 19-9 1,335. GI consulted and they recommended Palliative Care as he is "in no state for any procedures." They have signed off. Palliative consulted to assist in goals of care.  Clinical Assessment and Goals of Care: Anthony Bolton remains too encephalopathic to participate in a goals of care conversation. After multiple conversations with his close friend, a son was found and was able to speak with the primary team yesterday. Anthony Bolton was made DNR.   Through the incredible efforts of SW, the patient's son was able to visit him at the hospital. I met with Broadus Bolton at the bedside and talked with him about his father's medical issues and the options moving forward. I also shared that, unfortunately, at this point there is little to offer in terms of work-up and management of the presumed underlying pancreatic malignancy. Anthony Bolton is too debilitated and procedures would be of greater burden/risk than benefit. [name] understood this, and he was able to see how weak his father had  become. He also feels his father is dying. His goal is that his father is kept comfortable and allowed to live with dignity for whatever time he has left, and then die peacefully.   I shared with him the role Hospice could play in meeting his goals. We talked through the Hospice philosophy, the residential hospice house (appearance and services), and I focused on the use of medications to ensure his comfort. Tayvin felt Hospice would be the best option and asked for a residential option nearby to limit transport discomfort. He does understand his father is dying, and we discussed the prognosis of days to weeks, though likely less than two weeks.   Primary Decision Maker NEXT OF KIN   SUMMARY OF RECOMMENDATIONS    DNR, transition to full comfort measures and plan for discharge to residential hospice facility once chart reviewed and bed available. Given his PNA, I would continue antibiotics until time of discharge.   SW assisting in Hospice referral  Code Status/Advance Care Planning:  DNR  Palliative Prophylaxis:   Aspiration, Frequent Pain Assessment and Oral Care  Additional Recommendations (Limitations, Scope, Preferences):  Full Comfort Care; although continue abx until time of discharge  Psycho-social/Spiritual:   Desire for further Chaplaincy support:no  Additional Recommendations: Education on Hospice  Prognosis:   < 2 weeks. Anthony Bolton has a [presumed] metastatic pancreatic cancer. He has extensive liver mets with bile duct obstruction with progressive worsening of hepatic function. He is cachectic with minimal oral intake. He also has severe electrolyte abnormalities.   Discharge Planning: Hospice facility      Primary Diagnoses: Present on Admission: . AKI (acute kidney injury) ([place]) .  ALF (acute liver failure) . CAD (nonobstructive per cath 2015) . Chronic systolic congestive heart failure, NYHA class 3 ([place]) . COPD (chronic obstructive pulmonary disease)  ([place]) . HTN (hypertension) . Hyponatremia . Leukocytosis . Tobacco abuse . Protein-calorie malnutrition, severe . Normocytic anemia . HLD (hyperlipidemia) . Dehydration . Acute respiratory failure with hypoxia ([place]) . Sepsis ([place]) . CAP (community acquired pneumonia) . Hematemesis . Transaminitis . Elevated troponin  I have reviewed the medical record, interviewed the patient and family, and examined the patient. The following aspects are pertinent.  Past Medical History:  Diagnosis Date  . Adrenal adenoma, left 11/2015   adenoma on 2015 MRI, stable per CT 11/2015.   [place] Kitchen Anemia 09/2016   normocytic.  Hgb 10.2 on 10/25/16  . CHF (congestive heart failure) ([place]) 10/2013   per [place]: LVEF 20-25% 10/2013, 50-50% 07/2015  . COPD (chronic obstructive pulmonary disease) ([place])   . HLD (hyperlipidemia)   . HOH (hard of hearing)   . Migraines   . Prostate hypertrophy 09/2016   per CT 10/22/16.    . Protein-calorie malnutrition, severe ([place]) 09/2016   adult failure to thrive dx during 09/2016 admission  . SBO (small bowel obstruction) ([place]) 03/2015, 09/2016  . Thrombocytopenia ([place]) 09/2016   platelets 126K 10/25/16.     Social History   Social History  . Marital status: Single    Spouse name: N/A  . Number of children: N/A  . Years of education: N/A   Social History Main Topics  . Smoking status: Current Every Day Smoker    Packs/day: 0.50    Types: Cigarettes  . Smokeless tobacco: Never Used  . Alcohol use No  . Drug use: No  . Sexual activity: No   Other Topics Concern  . None   Social History Narrative  . None   Family History  Problem Relation Age of Onset  . Stroke Mother    Scheduled Meds: . feeding supplement (ENSURE ENLIVE)  237 mL Oral TID BM  . folic acid  1 mg Oral Daily  . guaiFENesin  1,200 mg Oral BID  . lactulose  20 g Oral TID  . mometasone-formoterol  2 puff Inhalation BID  . multivitamin with minerals  1 tablet Oral Daily  . nicotine  14 mg  Transdermal Daily  . pantoprazole  40 mg Intravenous Q12H  . sodium chloride flush  3 mL Intravenous Q12H  . thiamine  100 mg Oral Daily   Continuous Infusions: . azithromycin Stopped (04/06/17 0411)  . cefTRIAXone (ROCEPHIN)  IV Stopped (04/06/17 0256)  . octreotide  (SANDOSTATIN)    IV infusion 50 mcg/hr (04/06/17 0406)  . potassium chloride    . vancomycin Stopped (04/06/17 0508)   PRN Meds:.albuterol, ipratropium-albuterol, LORazepam, ondansetron **OR** ondansetron (ZOFRAN) IV No Known Allergies   Review of Systems  Unable to perform ROS  Physical Exam  Constitutional: He appears lethargic. He appears cachectic. He appears toxic.  HENT:  Head: Normocephalic and atraumatic.  Mouth/Throat: Oropharynx is clear and moist. Mucous membranes are dry. Abnormal dentition.  Eyes: EOM are normal. Scleral icterus is present.  Neck: Normal range of motion. Neck supple.  Cardiovascular: Normal rate and regular rhythm.   Pulmonary/Chest: Effort normal.  Abdominal: Soft.  Musculoskeletal:  Moves all extremities against gravity. Generalized weakness.  Neurological: He appears lethargic.  Lethargic but easily woken. Able to tell me his name, but otherwise confused.   Skin: Skin is warm and dry.  Jaundice  Psychiatric: His mood appears anxious.  His speech is delayed and tangential (confused). He is slowed and withdrawn. Cognition and memory are impaired.   Vital Signs: BP 107/86 (BP Location: Left Arm)   Pulse 94   Temp 99.4 F (37.4 C) (Axillary)   Resp (!) 22   Ht 5' 10"  (1.778 m)   Wt 44.2 kg (97 lb 7.1 oz)   SpO2 97%   BMI 13.98 kg/m  Pain Assessment: No/denies pain   Pain Score: 0-No pain  SpO2: SpO2: 97 % O2 Device:SpO2: 97 % O2 Flow Rate: .O2 Flow Rate (L/min): 2 L/min  IO: Intake/output summary:  Intake/Output Summary (Last 24 hours) at 04/06/17 0835 Last data filed at 04/06/17 0600  Gross per 24 hour  Intake          1574.25 ml  Output             2950 ml  Net          -1375.75 ml   LBM: Last BM Date: 04/04/17 Baseline Weight: Weight: 44.2 kg (97 lb 7.1 oz) Most recent weight: Weight: 44.2 kg (97 lb 7.1 oz)     Palliative Assessment/Data: PPS 20%   Flowsheet Rows     Most Recent Value  Intake Tab  Referral Department  Hospitalist  Unit at Time of Referral  ICU  Palliative Care Primary Diagnosis  Other (Comment) [liver disease]  Date Notified  04/03/17  Palliative Care Type  New Palliative care  Reason for referral  Clarify Goals of Care  Date of Admission  04/02/17  # of days IP prior to Palliative referral  1  Clinical Assessment  Psychosocial & Spiritual Assessment  Palliative Care Outcomes     Time in/out: 0830/0900; 1315/1410 Total time: 110 minutes Greater than 50%  of this time was spent counseling and coordinating care related to the above assessment and plan.  Signed by: Charlynn Court, NP Palliative Medicine Team Pager # [phone] (M-F 7a-5p) Team Phone # [phone] (Nights/Weekends)

## 2017-04-06 NOTE — Patient Outreach (Signed)
[facility]) Care Management  04/06/2017  Anthony Bolton 10-Jan-1951 [id number]   CSW will perform a case closure on patient, as patient is currently hospitalized and receiving end-of-life care services through Palliative Medicine.  CSW will notify patient's RNCM with [facility], Tish Men of CSW's plans to close patient's case.  CSW will fax an update to patient's Primary Care Physician, Dr. Lucianne Lei to ensure that they are aware of CSW's involvement with patient's plan of care.  CSW will submit a case closure request to Verlon Setting, Care Management Assistant with [place] [facility], in the form of an [employer].  CSW will ensure that [name] is aware of [name]'s, RNCM with [facility], continued involvement with patient's care. Nat Christen, BSW, MSW, LCSW  Licensed Education officer, [facility]  Mailing [place] [address], [place], [place], [place] [address] Physical Address-[address], [place], [place] [address] Toll Free Main # [phone] Fax # [phone] Cell # [phone]  Office # [phone] [email or web address]

## 2017-04-06 NOTE — Progress Notes (Addendum)
4pm CSW informed that [facility] can accept patient and will have bed available tomorrow.  CSW emailed [facility] contact to inform of patient transfer.  1:45pm CSW received call from palliative informed that pt son would like comfort care and transfer to [facility] for residential hospice  CSW called [place] and explained situation- liaison to meet with son to sign paperwork in case [facility] can accept patient since he will not be available in person again.  11am CSW received call back from the prison- they plan to have patient son at hospital around 1:15-1:30pm and he will have to leave by around 2:30pm- CSW updated palliative care and MD so they can meet with pt son in person if needed.  9am CSW received call from [facility]- they are trying to work it out so patient son can come visit patient today or tomorrow since he is EOL.  Pt so would also like Jade Helman added to the list of people who can visit patient  CSW will continue to follow and assist as needed.  Jorge Ny, LCSW Clinical Social Worker [phone]

## 2017-04-06 NOTE — Progress Notes (Signed)
PT Cancellation Note  Patient Details Name: Anthony Bolton MRN: [id number] DOB: 17-Jan-1951   Cancelled Treatment:    Reason Eval/Treat Not Completed: Other (comment) per RN, pt is at EOL and not appropriate for PT services at this time. Will sign off for now. Please re consult when appropriate.    [name] Waszak 04/06/2017, 9:04 AM Wray Kearns, PT, DPT [phone]

## 2017-04-07 DIAGNOSIS — J9601 Acute respiratory failure with hypoxia: Secondary | ICD-10-CM

## 2017-04-07 DIAGNOSIS — J690 Pneumonitis due to inhalation of food and vomit: Secondary | ICD-10-CM

## 2017-04-07 DIAGNOSIS — C259 Malignant neoplasm of pancreas, unspecified: Secondary | ICD-10-CM

## 2017-04-07 DIAGNOSIS — K7200 Acute and subacute hepatic failure without coma: Secondary | ICD-10-CM

## 2017-04-07 DIAGNOSIS — R945 Abnormal results of liver function studies: Secondary | ICD-10-CM

## 2017-04-07 DIAGNOSIS — N179 Acute kidney failure, unspecified: Secondary | ICD-10-CM

## 2017-04-07 DIAGNOSIS — K920 Hematemesis: Secondary | ICD-10-CM

## 2017-04-07 LAB — CULTURE, BLOOD (ROUTINE X 2)
Culture: NO GROWTH
Culture: NO GROWTH
Special Requests: ADEQUATE
Special Requests: ADEQUATE

## 2017-04-07 LAB — HEPATITIS PANEL, ACUTE
Hep A IgM: NEGATIVE
Hep B C IgM: NEGATIVE
Hepatitis B Surface Ag: NEGATIVE

## 2017-04-07 MED ORDER — IPRATROPIUM-ALBUTEROL 0.5-2.5 (3) MG/3ML IN SOLN: 3.0000 mL | Freq: Four times a day (QID) | RESPIRATORY_TRACT | Status: AC | PRN

## 2017-04-07 MED ORDER — MORPHINE SULFATE (CONCENTRATE) 10 MG/0.5ML PO SOLN: 5.0000 mg | ORAL | Status: AC | PRN

## 2017-04-07 NOTE — Discharge Summary (Addendum)
Physician Discharge Summary  Anthony Bolton DQQ:[id number] DOB: October 04, 1950  PCP: Lucianne Lei, MD  Admit date: 04/02/2017 Discharge date: 04/07/2017  Recommendations for Outpatient Follow-up:  1. Dr. Orpah Melter, at [facility] place for end of life/hospice care.  Home Health: None Equipment/Devices: None    Discharge Condition: Guarded and expected to decline.  CODE STATUS: DO NOT RESUSCITATE  Diet recommendation: Comfort feeds of choice.  Discharge Diagnoses:  Active Problems:   Hyponatremia   COPD (chronic obstructive pulmonary disease) (HCC)   Tobacco abuse   Chronic systolic congestive heart failure, NYHA class 3 (HCC)   HLD (hyperlipidemia)   CAD (nonobstructive per cath 2015)   HTN (hypertension)   AKI (acute kidney injury) (HCC)   Leukocytosis   Transaminitis   Dehydration   Protein-calorie malnutrition, severe   Acute respiratory failure with hypoxia (HCC)   ALF (acute liver failure)   Elevated troponin   Normocytic anemia   Sepsis ([place])   CAP (community acquired pneumonia)   Hematemesis   Palliative care by specialist   Pancreatic mass   Liver metastases ([place])   Pancreatic cancer metastasized to liver (HCC)   Hypophosphatemia   Hypomagnesemia   Acute hepatic encephalopathy   Sinus tachycardia   Multiple lesions of metastatic malignancy (HCC)   Goals of care, counseling/discussion   Brief Summary: 66 year old male with PMH of acetaminophen toxicity in March 2018, COPD supposed to be on oxygen 2 L/m, tobacco abuse, CAD, HTN, anemia, hard of hearing, recent hospitalization 12/03/16-12/10/16 for acetaminophen toxicity and acute liver injury related to unintentional overdose of acetaminophen used for treating his migraine headaches, non-anion gap metabolic acidosis, dehydration, sepsis ruled out, elevated troponin, abnormal TFTs, thrombocytopenia, COPD exacerbation, had poor social/living conditions prior to that hospitalization and lived in a motel, seen by  psychiatry and determine that he lacked capacity for medical decision making, discharged to SNF but then transitioned again to living in a motel, presented to the hospital when the [employer] manager called EMS after he had not seen the patient for a few days. EMS found him lying on the floor, incontinent of urine and stools, hypoxic, confused and jaundiced. He was admitted for liver failure with hepatic encephalopathy, possible hematemesis, severe dehydration and sepsis due to community-acquired versus aspiration pneumonia.  Assessment and plan:  1. Metastatic cancer, suspect pancreatic primary: Workup in the hospital revealed a suspected primary pancreatic malignancy with hepatic metastasis based on imaging studies including MRI and CT chest abdomen and pelvis and CA 19-9: 1335. [facility] was consulted and indicated that patient was in no state for any procedures (biopsy, ERCP + stent), did not recommend any further GI intervention and recommended palliative care consultation. Palliative care team met with patient's son at bedside on 04/06/17 and family at that time transitioned him to DO NOT RESUSCITATE, full comfort care & pursued residential hospice placement and [facility]. Patient appears comfortable and in no obvious distress. 2. Acute respiratory failure with hypoxia secondary to aspiration pneumonia: Antibiotics discontinued. Oxygen for comfort. 3. Sepsis secondary to aspiration pneumonia: Leukocytosis and lactic acidosis resolved. CT chest showed right lower lobe peribronchial vascular interstitial opacity with fluid and secretions in the endobronchial tree suspicious for aspiration. Initially treated with antibiotics which were then discontinued due to transitioning to comfort care. 4. Sinus tachycardia: Resolved. 5. Hematemesis: In the setting of liver disease. Was on octreotide and Protonix infusions. No interventions. No recurrence. Now comfort care. 6. Acute kidney injury: Possibly secondary to  dehydration versus hepatorenal syndrome. Resolved. 7.  Acute liver failure and hepatic encephalopathy: Likely related to primary pancreatic cancer with hepatic metastasis. Associated with abnormal LFTs, INR and meld score 35. Now comfort care. Patient does not have capacity to make medical decisions or decisions regarding placement. 8. CAD: Elevated troponin likely related to demand ischemia. 9. Chronic systolic CHF: No aggressive treatment. 10. COPD: No clinical bronchospasm. Supportive treatment. 11. Essential hypertension: All medications nonessential to comfort discontinued. 12. Electrolytes (hypokalemia, hypomagnesemia, hypophosphatemia): Addressed during course of admission. Remains hypokalemic. Hypomagnesemia and hypophosphatemia corrected. 13. Tobacco abuse 14. Severe protein calorie malnutrition 15. Normocytic anemia: Stable. 16. Hyperlipidemia 17. Adult failure to thrive: Multifactorial including advanced malignancy amongst other comorbidities as stated above.   Consultations:  [facility]  Palliative care medicine  Procedures:  None   Discharge Instructions  Discharge Instructions    Call MD for:  difficulty breathing, headache or visual disturbances    Complete by:  As directed    Call MD for:  severe uncontrolled pain    Complete by:  As directed    Discharge instructions    Complete by:  As directed    Diet: Comfort feeds of choice.   Increase activity slowly    Complete by:  As directed        Medication List    STOP taking these medications   albuterol 108 (90 Base) MCG/ACT inhaler Commonly known as:  PROVENTIL HFA;VENTOLIN HFA   aspirin EC 81 MG tablet   budesonide-formoterol 160-4.5 MCG/ACT inhaler Commonly known as:  SYMBICORT   metoprolol tartrate 25 MG tablet Commonly known as:  LOPRESSOR   pantoprazole 40 MG tablet Commonly known as:  PROTONIX   polyethylene glycol packet Commonly known as:  MIRALAX / GLYCOLAX   tiotropium 18 MCG  inhalation capsule Commonly known as:  SPIRIVA   traZODone 50 MG tablet Commonly known as:  DESYREL     TAKE these medications   ipratropium-albuterol 0.5-2.5 (3) MG/3ML Soln Commonly known as:  DUONEB Take 3 mLs by nebulization every 6 (six) hours as needed (wheezing, shortness of breath).   morphine CONCENTRATE 10 MG/0.5ML Soln concentrated solution Take 0.25 mLs (5 mg total) by mouth every 4 (four) hours as needed for moderate pain, severe pain, anxiety or shortness of breath.      Follow-up Information    Orpah Melter, MD Follow up.   Specialty:  Internal Medicine Contact information: [place] [address] [phone]          No Known Allergies    Procedures/Studies: Ct Abdomen Pelvis W Wo Contrast  Result Date: 04/04/2017 CLINICAL DATA:  Multiple liver lesions suspicious for metastasis. Biliary obstruction. EXAM: CT CHEST WITH CONTRAST CT ABDOMEN AND PELVIS WITH AND WITHOUT CONTRAST TECHNIQUE: Multidetector CT imaging of the chest was performed during intravenous contrast administration. Multidetector CT imaging of the abdomen and pelvis was performed following the standard protocol before and during bolus administration of intravenous contrast. CONTRAST:  112m ISOVUE-300 IOPAMIDOL (ISOVUE-300) INJECTION 61% COMPARISON:  MRI of 1 day prior. Chest CT 12/03/2016. abdominopelvic CT of 10/22/2016. FINDINGS: CT CHEST FINDINGS Cardiovascular: Aortic and branch vessel atherosclerosis. Normal heart size, without pericardial effusion. Multivessel coronary artery atherosclerosis. No central pulmonary embolism, on this non-dedicated study. Mediastinum/Nodes: No supraclavicular adenopathy. No mediastinal or hilar adenopathy. Lungs/Pleura: Small right pleural effusion. Fluid or debris in the endobronchial tree including the right lower lobe. Moderate centrilobular emphysema. Right lower lobe peribronchovascular interstitial  thickening is new since the prior exam, including  on image 34/series 10. Clear left lung. Musculoskeletal: No acute osseous abnormality. CT ABDOMEN AND PELVIS FINDINGS Hepatobiliary: Multiple peripherally enhancing liver lesions, as on MRI. Gallbladder not well evaluated but grossly normal. Moderate intra and extrahepatic biliary ductal dilatation to the level of distal common duct obstruction, including on image 166/series 11. Pancreas: Pancreatic atrophy and duct dilatation within the body and tail are moderate. Soft tissue fullness along the cephalad aspect the pancreatic head is favored to be arising from the pancreas. Example at 2.8 x 2.1 cm on image 166/series 11. No acute pancreatitis. Spleen: Normal in size, without focal abnormality. Adrenals/Urinary Tract: Left adrenal adenoma, as on prior. Normal right adrenal gland. Too small to characterize left renal lesions. Normal right kidney, without hydronephrosis. Normal urinary bladder. Stomach/Bowel: Mild gastric distension, without cause identified. The colon is normal in caliber. Mid small bowel loops are dilated, including at 5.4 cm on image 224/series 11. Solid stool like material at point of apparent transition, including on image 128/series 11. No pneumatosis or free intraperitoneal air. Vascular/Lymphatic: Advanced aortic and branch vessel atherosclerosis. Portal and splenic veins patent. No abdominopelvic adenopathy. Reproductive: Normal prostate. Other: Trace perihepatic ascites. Musculoskeletal: Degenerative disc disease at the lumbosacral junction. IMPRESSION: 1. Extensive hepatic metastasis, as on MRI. 2. Biliary obstruction secondary to a pancreatic head mass, likely the site of primary malignancy. 3. Right lower lobe peribronchovascular interstitial opacity with fluid and secretions in the endobronchial tree the. Suspicious for aspiration. Small right pleural effusion is likely secondary. 4. Coronary artery atherosclerosis. Aortic Atherosclerosis  (ICD10-I70.0). 5. Findings again suspicious for partial small bowel obstruction. Question internal hernia, given appearance of transition and similarity to the 10/22/2016 exam. 6. Small volume abdominal ascites. 7. Left adrenal adenoma. 8.  Emphysema (ICD10-J43.9). Electronically Signed   By: Abigail Miyamoto M.D.   On: 04/04/2017 11:23   Ct Head Wo Contrast  Result Date: 04/03/2017 CLINICAL DATA:  Found lying on floor, with confusion and incontinence. Initial encounter. EXAM: CT HEAD WITHOUT CONTRAST TECHNIQUE: Contiguous axial images were obtained from the base of the skull through the vertex without intravenous contrast. COMPARISON:  CT of the head performed 05/12/2016 FINDINGS: Brain: No evidence of acute infarction, hemorrhage, hydrocephalus, extra-axial collection or mass lesion/mass effect. Prominence of the ventricles and sulci reflects mild to moderate cortical volume loss. Cerebellar atrophy is noted. Scattered periventricular white matter change likely reflects small vessel ischemic microangiopathy. The brainstem and fourth ventricle are within normal limits. The basal ganglia are unremarkable in appearance. The cerebral hemispheres demonstrate grossly normal gray-white differentiation. No mass effect or midline shift is seen. Vascular: No hyperdense vessel or unexpected calcification. Skull: There is no evidence of fracture; visualized osseous structures are unremarkable in appearance. Sinuses/Orbits: The orbits are within normal limits. The paranasal sinuses and mastoid air cells are well-aerated. Other: No significant soft tissue abnormalities are seen. IMPRESSION: 1. No evidence of traumatic intracranial injury or fracture. 2. Mild to moderate cortical volume loss and scattered small vessel ischemic microangiopathy. Electronically Signed   By: Garald Balding M.D.   On: 04/03/2017 00:30   Ct Chest W Contrast  Result Date: 04/04/2017 CLINICAL DATA:  Multiple liver lesions suspicious for metastasis.  Biliary obstruction. EXAM: CT CHEST WITH CONTRAST CT ABDOMEN AND PELVIS WITH AND WITHOUT CONTRAST TECHNIQUE: Multidetector CT imaging of the chest was performed during intravenous contrast administration. Multidetector CT imaging of the abdomen and pelvis was performed following the standard protocol before and during bolus administration of intravenous contrast. CONTRAST:  120m ISOVUE-300 IOPAMIDOL (  ISOVUE-300) INJECTION 61% COMPARISON:  MRI of 1 day prior. Chest CT 12/03/2016. abdominopelvic CT of 10/22/2016. FINDINGS: CT CHEST FINDINGS Cardiovascular: Aortic and branch vessel atherosclerosis. Normal heart size, without pericardial effusion. Multivessel coronary artery atherosclerosis. No central pulmonary embolism, on this non-dedicated study. Mediastinum/Nodes: No supraclavicular adenopathy. No mediastinal or hilar adenopathy. Lungs/Pleura: Small right pleural effusion. Fluid or debris in the endobronchial tree including the right lower lobe. Moderate centrilobular emphysema. Right lower lobe peribronchovascular interstitial thickening is new since the prior exam, including on image 34/series 10. Clear left lung. Musculoskeletal: No acute osseous abnormality. CT ABDOMEN AND PELVIS FINDINGS Hepatobiliary: Multiple peripherally enhancing liver lesions, as on MRI. Gallbladder not well evaluated but grossly normal. Moderate intra and extrahepatic biliary ductal dilatation to the level of distal common duct obstruction, including on image 166/series 11. Pancreas: Pancreatic atrophy and duct dilatation within the body and tail are moderate. Soft tissue fullness along the cephalad aspect the pancreatic head is favored to be arising from the pancreas. Example at 2.8 x 2.1 cm on image 166/series 11. No acute pancreatitis. Spleen: Normal in size, without focal abnormality. Adrenals/Urinary Tract: Left adrenal adenoma, as on prior. Normal right adrenal gland. Too small to characterize left renal lesions. Normal right  kidney, without hydronephrosis. Normal urinary bladder. Stomach/Bowel: Mild gastric distension, without cause identified. The colon is normal in caliber. Mid small bowel loops are dilated, including at 5.4 cm on image 224/series 11. Solid stool like material at point of apparent transition, including on image 128/series 11. No pneumatosis or free intraperitoneal air. Vascular/Lymphatic: Advanced aortic and branch vessel atherosclerosis. Portal and splenic veins patent. No abdominopelvic adenopathy. Reproductive: Normal prostate. Other: Trace perihepatic ascites. Musculoskeletal: Degenerative disc disease at the lumbosacral junction. IMPRESSION: 1. Extensive hepatic metastasis, as on MRI. 2. Biliary obstruction secondary to a pancreatic head mass, likely the site of primary malignancy. 3. Right lower lobe peribronchovascular interstitial opacity with fluid and secretions in the endobronchial tree the. Suspicious for aspiration. Small right pleural effusion is likely secondary. 4. Coronary artery atherosclerosis. Aortic Atherosclerosis (ICD10-I70.0). 5. Findings again suspicious for partial small bowel obstruction. Question internal hernia, given appearance of transition and similarity to the 10/22/2016 exam. 6. Small volume abdominal ascites. 7. Left adrenal adenoma. 8.  Emphysema (ICD10-J43.9). Electronically Signed   By: Abigail Miyamoto M.D.   On: 04/04/2017 11:23   US Abdomen Complete  Result Date: 04/03/2017 CLINICAL DATA:  Initial evaluation for acute hepatic failure, elevated LFTs, abdominal distension. EXAM: ABDOMEN ULTRASOUND COMPLETE COMPARISON:  None. FINDINGS: Gallbladder: Gallbladder partially contracted without stones or sludge. Gallbladder wall measure within normal limits a 1.5 mm. No free pericholecystic fluid. No sonographic Murphy sign elicited on exam. Common bile duct: Diameter: 7.9 mm. Liver: Heterogeneous with multiple solid lesions present. Most prominent of these present within the right  hepatic lobe and measured 2.7 x 2.8 x 3.4 cm. Mild intrahepatic biliary dilatation. IVC: No abnormality visualized. Pancreas: Not visualized on this exam due to overlying bowel gas. Spleen: Poorly visualized due to overlying fluid-filled stomach. Right Kidney: Length: 10.3 cm. Echogenicity within normal limits. No mass or hydronephrosis visualized. Left Kidney: Length: 9.7 cm. Grossly normal, although poorly visualized due to shadowing from overlying bowel gas. Abdominal aorta: No aneurysm visualized. Other findings: None. IMPRESSION: 1. Heterogeneous liver with multiple lesions present, largest of which measures 2.7 x 2.8 x 3.4 cm in the right hepatic lobe. Further evaluation with dedicated cross-sectional imaging recommended. 2. Mild intra and extrahepatic biliary dilatation. No other acute biliary pathology. 3.  No other acute abnormality within the abdomen, with mild limitations as above. Electronically Signed   By: Jeannine Boga M.D.   On: 04/03/2017 02:38   Mr Liver W DX Contrast  Result Date: 04/03/2017 CLINICAL DATA:  Abnormal liver function tests. History drug toxicity in March. Tobacco abuse. Coronary artery disease. Tobacco abuse. CHF. EXAM: MRI ABDOMEN WITHOUT AND WITH CONTRAST TECHNIQUE: Multiplanar multisequence MR imaging of the abdomen was performed both before and after the administration of intravenous contrast. CONTRAST:  9 cc MultiHance COMPARISON:  Ultrasound of earlier today. Chest CT of 12/03/2016. abdominopelvic CT of 10/22/2016. FINDINGS: Moderate motion degradation throughout. The pre and postcontrast dynamic images are especially motion degraded. Lower chest: Normal heart size without pericardial or pleural effusion. Right lower lobe peribronchovascular interstitial thickening is suboptimally evaluated on image 11/series 3 of. Hepatobiliary: Interval development of multiple T2 hyperintense enhancing liver lesions, consistent with metastatic disease. Examples in the central left  hepatic lobe at 2.4 cm on image 7/series 4 and within the posterior right hepatic lobe at 1.9 cm on image 17/series 4. Moderate intrahepatic biliary duct dilatation. The common duct is moderately dilated at 13 mm on image 26/series 3. No choledocholithiasis. Pancreas: Pancreatic atrophy is moderate. Mild pancreatic duct dilatation, including on image 22/series 4. An area of soft tissue fullness which is positioned either cephalad to or less likely exophytic off the pancreatic head measures between 1.8 cm (image 23/series 4) and 2.2 cm on image 54/ series 1002. This causes obstruction of the common duct. No acute pancreatitis. Spleen:  Normal in size, without focal abnormality. Adrenals/Urinary Tract: Normal right adrenal gland. A left adrenal mass measures 4.1 cm and has been present, including back to 05/02/2015. Demonstrate signal dropout on out of phase imaging, consistent with an adenoma. Too small to characterize left renal lesions. Normal right kidney, without hydronephrosis. Stomach/Bowel: Stomach is mildly distended, without cause identified. There are prominent gas-filled loops of small bowel, including image 33/ series 4. The colon is normal in caliber. Vascular/Lymphatic: Aortic and branch vessel atherosclerosis. Portal vein suboptimally evaluated but grossly patent. Suspect portacaval adenopathy as detailed above. Other:  No gross ascites. Musculoskeletal: No acute osseous abnormality. IMPRESSION: 1. Moderate motion degraded exam, especially involving the pre and postcontrast dynamic images. 2. Interval development of multiple hepatic lesions which are consistent with metastatic disease. 3. Development of biliary duct dilatation, secondary to an area of soft tissue signal which could represent either a portal caval nodal metastasis or an exophytic pancreatic head adenocarcinoma. 4. Patient may benefit from dedicated pre and post contrast abdominal CT, ideally using a pancreatic protocol. 5. Depending on  results of this study, consider sampling of liver lesions. If differentiation of nodal metastasis (from a presumed unknown primary) and pancreatic adenocarcinoma is needed, endoscopic ultrasound sampling may be necessary. 6. Right-sided peribronchovascular pulmonary interstitial thickening is nonspecific and may represent infection when correlated with today's chest radiograph. 7. Possible residual or recurrent partial small bowel obstruction, suboptimally evaluated. 8. Left adrenal adenoma. 9.  Aortic Atherosclerosis (ICD10-I70.0). Electronically Signed   By: Abigail Miyamoto M.D.   On: 04/03/2017 19:41   Dg Chest Port 1 View  Result Date: 04/03/2017 CLINICAL DATA:  Found on floor, with incontinence and confusion. Initial encounter. EXAM: PORTABLE CHEST 1 VIEW COMPARISON:  Chest radiograph performed 12/09/2016 FINDINGS: The lungs are hyperexpanded, with flattening of the hemidiaphragms, compatible with COPD. Peribronchial thickening is noted. Mild right perihilar opacity could reflect mild infection. No pleural effusion or pneumothorax is seen. The cardiomediastinal silhouette  is within normal limits. No acute osseous abnormalities are seen. IMPRESSION: 1. Mild right perihilar opacity could reflect mild infection, depending on the patient's symptoms. 2. Findings of COPD.  Peribronchial thickening noted. Electronically Signed   By: Garald Balding M.D.   On: 04/03/2017 00:29      Subjective: Extremely hard of hearing. Pleasantly confused. Oriented only to self. Requests that the bed be adjusted for comfort. No pain reported. As per RN, no acute issues.  Discharge Exam:  Vitals:   04/07/17 0700 04/07/17 0800 04/07/17 0806 04/07/17 1100  BP:  94/70  (!) 84/68  Pulse:  100  86  Resp:  (!) 21  20  Temp:      TempSrc:      SpO2: 99% 99% 100% 100%  Weight:      Height:        General: Pleasant middle-aged male, looks older than stated age, moderately built, frail, cachectic and chronically ill looking,  lying comfortably propped up in bed. Oral mucosa with borderline hydration. Scleral and skin icterus + +. Cardiovascular: S1 & S2 heard, RRR, S1/S2 +. No murmurs, rubs, gallops or clicks. No JVD or pedal edema. Telemetry: Sinus rhythm. Respiratory: Reduced breath sounds in the bases but no wheezing, rhonchi or crackles appreciated. No increased work of breathing. Abdominal:  Non distended, non tender & soft. No organomegaly or masses appreciated. Normal bowel sounds heard. Condom catheter + CNS: Alert and oriented only to self. No focal deficits. Extremities: no edema, no cyanosis    The results of significant diagnostics from this hospitalization (including imaging, microbiology, ancillary and laboratory) are listed below for reference.     Microbiology: Recent Results (from the past 240 hour(s))  Culture, blood (Routine x 2)     Status: None   Collection Time: 04/02/17  9:30 PM  Result Value Ref Range Status   Specimen Description BLOOD RIGHT ANTECUBITAL  Final   Special Requests IN PEDIATRIC BOTTLE Blood Culture adequate volume  Final   Culture NO GROWTH 5 DAYS  Final   Report Status 04/07/2017 FINAL  Final  Culture, blood (Routine x 2)     Status: None   Collection Time: 04/02/17  9:55 PM  Result Value Ref Range Status   Specimen Description BLOOD RIGHT HAND  Final   Special Requests IN PEDIATRIC BOTTLE Blood Culture adequate volume  Final   Culture NO GROWTH 5 DAYS  Final   Report Status 04/07/2017 FINAL  Final  MRSA PCR Screening     Status: Abnormal   Collection Time: 04/03/17  3:30 AM  Result Value Ref Range Status   MRSA by PCR INVALID RESULTS, SPECIMEN SENT FOR CULTURE (A) NEGATIVE Final    Comment: M. BROWN [id number] 07.07.2018 N. MORRIS        The GeneXpert MRSA Assay (FDA approved for NASAL specimens only), is one component of a comprehensive MRSA colonization surveillance program. It is not intended to diagnose MRSA infection nor to guide or monitor treatment  for MRSA infections.   MRSA culture     Status: None   Collection Time: 04/03/17  8:39 AM  Result Value Ref Range Status   Specimen Description NASOPHARYNGEAL  Final   Special Requests NONE  Final   Culture NO MRSA DETECTED  Final   Report Status 04/04/2017 FINAL  Final     Labs: CBC:  Recent Labs Lab 04/02/17 2305  04/03/17 1604 04/03/17 2147 04/04/17 0330 04/05/17 0447 04/06/17 0814  WBC 20.7*  < > 14.2*  12.6* 10.5 8.0 6.8  NEUTROABS 18.9*  --   --   --  8.9* 6.6 5.8  HGB 11.9*  < > 9.6* 10.4* 10.8* 10.1* 10.4*  HCT 34.5*  < > 27.9* 30.9* 32.4* 30.1* 31.1*  MCV 84.8  < > 85.1 85.8 86.9 86.5 85.2  PLT 298  < > 259 260 265 238 225  < > = values in this interval not displayed. Basic Metabolic Panel:  Recent Labs Lab 04/03/17 0056 04/03/17 0539 04/03/17 2147 04/04/17 0330 04/05/17 0447 04/06/17 0227  NA  --  128* 129* 128* 129* 128*  K  --  3.7 3.0* 2.9* 2.6* 2.9*  CL  --  93* 98* 98* 95* 91*  CO2  --  _0 GLUCOSE  --  217* 109* 134* 195* 115*  BUN  --  48* 35* 31* 11 9  CREATININE  --  1.41* 0.84 0.76 0.58* 0.47*  CALCIUM  --  7.8* 7.8* 8.0* 7.6* 7.9*  MG 1.8 1.7  --  1.7 1.4* 1.7  PHOS 6.6* 6.6*  --  3.2 2.4* 2.8   Liver Function Tests:  Recent Labs Lab 04/03/17 0539 04/03/17 2147 04/04/17 0330 04/05/17 0447 04/06/17 0227  AST 218* 213* 219* 187* 186*  ALT 219* 209* 211* 209* 224*  ALKPHOS 701* 644* 673* 735* 828*  BILITOT 30.2* 28.1* 29.4* 29.3* 32.4*  PROT 5.2* 5.2* 5.0* 4.9* 5.4*  ALBUMIN 2.5* 2.3* 2.3* 2.1*  2.1* 2.2*  2.1*   BNP (last 3 results)  Recent Labs  10/13/16 2046 12/03/16 2009  BNP 64.5 342.8*   Cardiac Enzymes:  Recent Labs Lab 04/03/17 0056 04/03/17 0500 04/06/17 0227  CKTOTAL  --  1,450* 473*  CKMB  --   --  9.1*  TROPONINI 0.13*  --   --    CBG:  Recent Labs Lab 04/04/17 2214  GLUCAP 172*   Urinalysis    Component Value Date/Time   COLORURINE AMBER (A) 04/03/2017 1727   APPEARANCEUR HAZY  (A) 04/03/2017 1727   LABSPEC 1.014 04/03/2017 1727   PHURINE 6.0 04/03/2017 1727   GLUCOSEU NEGATIVE 04/03/2017 1727   HGBUR MODERATE (A) 04/03/2017 1727   BILIRUBINUR MODERATE (A) 04/03/2017 1727   KETONESUR NEGATIVE 04/03/2017 1727   PROTEINUR 30 (A) 04/03/2017 1727   UROBILINOGEN 0.2 05/02/2015 1439   NITRITE NEGATIVE 04/03/2017 1727   LEUKOCYTESUR NEGATIVE 04/03/2017 1727      Time coordinating discharge: Over 30 minutes  SIGNED:  Vernell Leep, MD, FACP, FHM. [facility] Pager [phone]  If 7PM-7AM, please contact night-coverage [email or web address] Password [facility] 04/07/2017, 12:17 PM

## 2017-04-07 NOTE — Consult Note (Signed)
   [facility] Inpatient Consult   04/07/2017  JAESHAWN SILVIO 09-27-1951 [id number]   [facility] Care Management follow up.  Chart reviewed. Noted Mr. Batch is discharging to [facility] residential hospice facility. Will update [facility] Care Management team.  Marthenia Rolling, [place], RN,BSN [facility] Liaison [phone]

## 2017-04-07 NOTE — Progress Notes (Signed)
Pt discharged to [facility] via [facility]. Low BP at discharge. All belongings sent with patient. Discharge packet given to [facility].

## 2017-04-07 NOTE — Progress Notes (Signed)
Daily Progress Note   Patient Name: Anthony Bolton       Date: 04/07/2017 DOB: [birth year]  Age: 66 y.o. MRN#: [id number] Attending Physician: Modena Jansky, MD Primary Care Physician: Lucianne Lei, MD Admit Date: 04/02/2017  Reason for Consultation/Follow-up: Non pain symptom management, Psychosocial/spiritual support and Terminal Care  Subjective: Anthony Bolton is more verbally interactive today. He remains oriented to person only, however he is more vocal about his needs. His main concern was bed positioning, which was improved after multiple adjustments.   Length of Stay: 4  Current Medications: Scheduled Meds:  . feeding supplement (ENSURE ENLIVE)  237 mL Oral TID BM  . guaiFENesin  1,200 mg Oral BID  . mometasone-formoterol  2 puff Inhalation BID  . nicotine  14 mg Transdermal Daily  . pantoprazole  40 mg Intravenous Q12H  . sodium chloride flush  3 mL Intravenous Q12H    Continuous Infusions: . azithromycin Stopped (04/07/17 0450)  . cefTRIAXone (ROCEPHIN)  IV Stopped (04/07/17 0418)    PRN Meds: albuterol, ipratropium-albuterol, LORazepam, morphine CONCENTRATE, ondansetron **OR** ondansetron (ZOFRAN) IV  Physical Exam         Constitutional: He appears cachectic. He appears toxic.  HENT:  Head: Normocephalic and atraumatic.  Mouth/Throat: Oropharynx is clear and moist. Mucous membranes are dry. Abnormal dentition.  Eyes: EOM are normal. Scleral icterus is present.  Neck: Normal range of motion. Neck supple.  Cardiovascular: Normal rate and regular rhythm.   Pulmonary/Chest: Effort normal.  Abdominal: Soft.  Musculoskeletal:  Moves all extremities against gravity. Generalized weakness.  Neurological:  Alert this morning. Oriented to self only. Not following simple commands.  Skin: Skin is warm and dry.  Jaundice    Psychiatric: Demanding and easily irritated. Confused with impaired cognition and memory.   Vital Signs: BP 95/78 (BP Location: Left Arm)   Pulse 98   Temp 98.1 F (36.7 C) (Axillary)   Resp (!) 24   Ht 5\' 10"  (1.778 m)   Wt 44.2 kg (97 lb 7.1 oz)   SpO2 100%   BMI 13.98 kg/m  SpO2: SpO2: 100 % O2 Device: O2 Device: Nasal Cannula O2 Flow Rate: O2 Flow Rate (L/min): 2 L/min  Intake/output summary:  Intake/Output Summary (Last 24 hours) at 04/07/17 0834 Last data filed at 04/07/17 8250  Gross per 24 hour  Intake          1321.25 ml  Output             2155 ml  Net          -833.75 ml   LBM: Last BM Date: 04/04/17 Baseline Weight: Weight: 44.2 kg (97 lb 7.1 oz) Most recent weight: Weight: 44.2 kg (97 lb 7.1 oz)  Palliative Assessment/Data: PPS 20-30%   Flowsheet Rows     Most Recent Value  Intake Tab  Referral Department  Hospitalist  Unit at Time of Referral  ICU  Palliative Care Primary Diagnosis  Other (Comment) [liver disease]  Date Notified  04/03/17  Palliative Care Type  New Palliative care  Reason for referral  Clarify Goals of Care  Date of Admission  04/02/17  # of days IP prior to Palliative referral  1  Clinical Assessment  Psychosocial & Spiritual Assessment  Palliative Care Outcomes      Patient Active Problem List   Diagnosis Date Noted  . Multiple lesions of metastatic malignancy ([place])   . Goals of care, counseling/discussion   . Hypophosphatemia 04/05/2017  . Hypomagnesemia 04/05/2017  . Acute hepatic encephalopathy 04/05/2017  . Sinus tachycardia 04/05/2017  . Pancreatic mass 04/04/2017  . Liver metastases ([place]) 04/04/2017  . Pancreatic cancer metastasized to liver ([place]) 04/04/2017  . Palliative care by specialist   . Hemoptysis 04/03/2017  . Sepsis ([place]) 04/03/2017  . CAP (community acquired pneumonia) 04/03/2017  . Hematemesis 04/03/2017  . Abnormal thyroid function test   . Normocytic anemia   . Elevated troponin   .  Acetaminophen toxicity 12/04/2016  . ALF (acute liver failure) 12/04/2016  . Acetaminophen overdose of undetermined intent   . 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 Aortic atherosclerosis ([place]) 10/22/2016  . Protein-calorie malnutrition, severe 10/07/2016  . Acute respiratory failure with hypoxia ([place]) 10/07/2016  . Other emphysema ([place]) 10/07/2016  . Transaminitis 12/03/2015  . Dehydration   . High anion gap metabolic acidosis 62/83/1517  . Malnutrition due to starvation ([place]) 12/02/2015  . Leukocytosis 12/02/2015  . Abdominal pain 10/12/2015  . Nausea & vomiting 10/12/2015  . Chest pain 10/12/2015  . AKI (acute kidney injury) ([place]) 10/12/2015  . SBO (small bowel obstruction) ([place]) 10/12/2015  . Acute respiratory failure ([place]) 08/11/2015  . Hard of hearing 08/11/2015  . CAD (nonobstructive per cath 2015) 08/11/2015  . HTN (hypertension) 08/11/2015  . HLD (hyperlipidemia)   . Malnutrition of moderate degree ([place]) 03/02/2015  . COPD exacerbation ([place]) 03/01/2015  . Chronic systolic congestive heart failure, NYHA class 3 ([place]) 03/01/2015  . Hyperthyroidism 11/06/2013  . Mass of adrenal gland ([place]) 11/04/2013  . Tobacco abuse 11/04/2013  . Hyponatremia 10/31/2013  . COPD (chronic obstructive pulmonary disease) ([place]) 10/31/2013    Palliative Care Assessment & Plan   HPI: 66 y.o. 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 In his last hospitalization he was discharged to SNF, however then transitioned to living in a motel. This admission was prompted when the motel's manager called EMS after he hadn't seen the patient for a few days. EMS found him lying on the floor, incontinent of urine and stool, hypoxic, confused, and jaundiced. He was admitted on 04/02/2017 . 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 CA 19-9 1,335. GI consulted and they  recommended Palliative Care as he is "in no state for any procedures." They have signed off. Palliative consulted to assist in goals of care.  Assessment: I had the opportunity to meet with Anthony Bolton son, Anthony Bolton (goes by [employer]), at the bedside yesterday. We talked through his father's health issues and the decision was made to transition him to full comfort care and pursue residential hospice placement at [facility]. [facility] liaison assessed and plan for transfer today.   Today, I stopped by to ensure adequate symptom management to ensure comfort at the end of life. Mr. Olarte was much more awake and vocal this morning, however he remains very confused. One dose of morphine used last night. He does appear symptomatically well controlled today. I believe he is stable for discharge and remains appropriate for residential hospice.  Recommendations/Plan:  DNR, full comfort care with plan to transition to residential hospice today  I have stopped his antibiotics, per  the plan discussed with is son yesterday  Goals of Care and Additional Recommendations:  Limitations on Scope of Treatment: Full Comfort Care  Code Status:  DNR  Prognosis:   < 2 weeks  Discharge Planning:  Hospice facility  Care plan was discussed with pt, son (on 7/10)  Thank you for allowing the Palliative Medicine Team to assist in the care of this patient.  Total time: 15 minutes    Greater than 50%  of this time was spent counseling and coordinating care related to the above assessment and plan.  Charlynn Court, NP Palliative Medicine Team [phone] pager (7a-5p) Team Phone # [phone]

## 2017-04-07 NOTE — Progress Notes (Signed)
Patient will discharge to [facility]  Anticipated discharge date: 7/11 Family notified: pt son Transportation by [employer]- scheduled for 1:15pm  CSW signing off.  Jorge Ny, LCSW Clinical Social Worker [phone]

## 2017-04-07 NOTE — Progress Notes (Signed)
Report called to [facility] staff member [place].

## 2017-04-07 NOTE — Progress Notes (Signed)
Plan for pt transfer to residential hospice later today  CSW called [facility] where pt was staying and inquired about pt belongings- they confirmed pt belongings are still in their possession and that they would be willing to release them to family or a family friend  CSW provided message to prison coordinator and awaiting permission from pt son to contact one of his friends to pick up his fathers belongings  CSW will continue to follow and assist as needed  Jorge Ny, [place] Social Worker [phone]

## 2017-04-07 NOTE — Consult Note (Signed)
[facility] [employer]  Received request from [place] 04/06/17 for family interest in [facility]. Chart reviewed and met with son Camillia Herter at bedside 04/06/17 to complete paper work for transfer to [facility] today. Confirmed goals are for comfort and end of life care. Dr. Orpah Melter to assume care per Anthony Bolton's request. Camillia Herter has [facility] contact information and accepted Patient Care Notebook which includes grief support section. Appreciate efforts of CSW [place] in making these arrangements.   Please fax discharge summary to [phone].  RN please call report to [phone].  Thank you,  Erling Conte, LCSW [phone]

## 2017-04-16 ENCOUNTER — Ambulatory Visit: Payer: Self-pay | Admitting: *Deleted

## 2017-04-28 DEATH — deceased
